# Patient Record
Sex: Female | Born: 1965 | Race: White | Hispanic: No
Health system: Midwestern US, Community
[De-identification: ages and names within clinical notes are randomized; demographics above are authoritative.]

## PROBLEM LIST (undated history)

## (undated) VITALS — BP 107/68 | HR 88 | Temp 97.3°F | Resp 16 | Ht 68.25 in | Wt 198.8 lb

## (undated) DIAGNOSIS — F419 Anxiety disorder, unspecified: Secondary | ICD-10-CM

## (undated) DIAGNOSIS — R7611 Nonspecific reaction to tuberculin skin test without active tuberculosis: Secondary | ICD-10-CM

## (undated) DIAGNOSIS — Z8619 Personal history of other infectious and parasitic diseases: Secondary | ICD-10-CM

## (undated) DIAGNOSIS — K259 Gastric ulcer, unspecified as acute or chronic, without hemorrhage or perforation: Secondary | ICD-10-CM

## (undated) DIAGNOSIS — T7840XA Allergy, unspecified, initial encounter: Secondary | ICD-10-CM

## (undated) DIAGNOSIS — G4730 Sleep apnea, unspecified: Secondary | ICD-10-CM

## (undated) DIAGNOSIS — L509 Urticaria, unspecified: Secondary | ICD-10-CM

## (undated) DIAGNOSIS — F32A Depression, unspecified: Secondary | ICD-10-CM

## (undated) DIAGNOSIS — M1990 Unspecified osteoarthritis, unspecified site: Secondary | ICD-10-CM

## (undated) DIAGNOSIS — G43909 Migraine, unspecified, not intractable, without status migrainosus: Secondary | ICD-10-CM

## (undated) DIAGNOSIS — Z8719 Personal history of other diseases of the digestive system: Secondary | ICD-10-CM

## (undated) DIAGNOSIS — I10 Essential (primary) hypertension: Secondary | ICD-10-CM

## (undated) DIAGNOSIS — F4310 Post-traumatic stress disorder, unspecified: Secondary | ICD-10-CM

## (undated) DIAGNOSIS — K219 Gastro-esophageal reflux disease without esophagitis: Secondary | ICD-10-CM

## (undated) DIAGNOSIS — E7800 Pure hypercholesterolemia, unspecified: Secondary | ICD-10-CM

## (undated) DIAGNOSIS — T783XXA Angioneurotic edema, initial encounter: Secondary | ICD-10-CM

## (undated) DIAGNOSIS — F329 Major depressive disorder, single episode, unspecified: Secondary | ICD-10-CM

## (undated) DIAGNOSIS — Z5189 Encounter for other specified aftercare: Secondary | ICD-10-CM

## (undated) DIAGNOSIS — K861 Other chronic pancreatitis: Secondary | ICD-10-CM

## (undated) DIAGNOSIS — K831 Obstruction of bile duct: Secondary | ICD-10-CM

## (undated) HISTORY — DX: Encounter for other specified aftercare: Z51.89

## (undated) HISTORY — DX: Sleep apnea, unspecified: G47.30

## (undated) HISTORY — DX: Migraine, unspecified, not intractable, without status migrainosus: G43.909

## (undated) HISTORY — DX: Other chronic pancreatitis: K86.1

## (undated) HISTORY — DX: Anxiety disorder, unspecified: F41.9

## (undated) HISTORY — PX: ABDOMINAL HYSTERECTOMY: SHX81

## (undated) HISTORY — DX: Morbid (severe) obesity due to excess calories: E66.01

## (undated) HISTORY — DX: Nonspecific reaction to tuberculin skin test without active tuberculosis: R76.11

## (undated) HISTORY — DX: Angioneurotic edema, initial encounter: T78.3XXA

## (undated) HISTORY — PX: FOOT SURGERY: SHX648

## (undated) HISTORY — DX: Depression, unspecified: F32.A

## (undated) HISTORY — PX: COLONOSCOPY: SHX174

## (undated) HISTORY — DX: Obstruction of bile duct: K83.1

## (undated) HISTORY — DX: Allergy, unspecified, initial encounter: T78.40XA

## (undated) HISTORY — DX: Personal history of other infectious and parasitic diseases: Z86.19

## (undated) HISTORY — DX: Urticaria, unspecified: L50.9

## (undated) HISTORY — PX: EYE SURGERY: SHX253

## (undated) HISTORY — PX: OTHER SURGICAL HISTORY: SHX169

## (undated) HISTORY — PX: GASTRIC BYPASS: SHX52

## (undated) HISTORY — DX: Major depressive disorder, single episode, unspecified: F32.9

## (undated) HISTORY — DX: Post-traumatic stress disorder, unspecified: F43.10

## (undated) HISTORY — DX: Gastric ulcer, unspecified as acute or chronic, without hemorrhage or perforation: K25.9

---

## 2004-10-08 ENCOUNTER — Encounter (INDEPENDENT_AMBULATORY_CARE_PROVIDER_SITE_OTHER): Payer: Self-pay | Admitting: *Deleted

## 2004-10-09 ENCOUNTER — Inpatient Hospital Stay (HOSPITAL_COMMUNITY): Admission: RE | Admit: 2004-10-09 | Discharge: 2004-10-11 | Payer: Self-pay | Admitting: Gynecology

## 2005-08-27 ENCOUNTER — Other Ambulatory Visit: Admission: RE | Admit: 2005-08-27 | Discharge: 2005-08-27 | Payer: Self-pay | Admitting: Gynecology

## 2006-08-31 ENCOUNTER — Other Ambulatory Visit: Admission: RE | Admit: 2006-08-31 | Discharge: 2006-08-31 | Payer: Self-pay | Admitting: Gynecology

## 2009-12-02 ENCOUNTER — Emergency Department (HOSPITAL_BASED_OUTPATIENT_CLINIC_OR_DEPARTMENT_OTHER): Admission: EM | Admit: 2009-12-02 | Discharge: 2009-12-02 | Payer: Self-pay | Admitting: Emergency Medicine

## 2010-01-13 ENCOUNTER — Emergency Department (HOSPITAL_BASED_OUTPATIENT_CLINIC_OR_DEPARTMENT_OTHER): Admission: EM | Admit: 2010-01-13 | Discharge: 2010-01-13 | Payer: Self-pay | Admitting: Emergency Medicine

## 2010-06-17 ENCOUNTER — Other Ambulatory Visit: Payer: Self-pay | Admitting: Gynecology

## 2010-08-30 NOTE — Discharge Summary (Signed)
NAMEZula, Brittany [name]                  ACCOUNT NO.:  [id number]   MEDICAL RECORD NO.:  [id number]          PATIENT TYPE:  INP   LOCATION:  1618                         FACILITY:  [facility]   PHYSICIAN:  Gretta Cool, M.D. DATE OF BIRTH:  Jan 25, 1966   DATE OF ADMISSION:  10/08/2004  DATE OF DISCHARGE:  10/11/2004                                 DISCHARGE SUMMARY   HISTORY OF PRESENT ILLNESS:  [name] is a 45 year old female, gravida 0,  who reported that she had a hysteroscopy ablation procedure by Gynecare's  device done on July 11, 2004.  Approximately two weeks later, was  hospitalized with pelvic infection, treated intensely with antibiotics from  April 13-16.  On follow up, she was noted to have a 6 cm residual  inflammatory mass.  She continued treatment with Cipro and Flagyl under the  primary care of Dr. Quentin Cornwall, her primary care physician.  She was  subsequently seen in our office to evaluate continuing intense abdominal  pain.  Ultrasound revealed a complex cystic mass compatible with a tubo-  ovarian inflammatory mass.  The uterus showed no significant residual  endometrium but some loculation of fluid.  Her sed rate was elevated to 88.  She had an elevated white count, elevated platelet count with a shift to the  left.  She was continued on intensive antibiotic therapy for diagnosis of  pelvic inflammatory mass and persistent inflammatory disease.  Over time,  her sed rate dropped to 64 and most recent in September 17, 2004, was 63.  CA-125  was slightly elevated at 30.5.  CRP was 28.9.  Her process was felt to be  sufficiently resolved, and she is now admitted for definitive therapy by  exploratory laparotomy, cervical hysterectomy and left salpingo-  oophorectomy.  She has a history of right salpingo-oophorectomy, had  laparotomy in 1997 for pelvic inflammatory process.  She has involuntary  infertility and history of PID in 1997.  She is now admitted for definitive   therapy.   PHYSICAL EXAMINATION:  CHEST:  Clear to A&P.  HEART:  Regular rate and rhythm without murmurs, gallops, or cardiac  enlargement.  ABDOMEN:  Soft with massive panniculi.  Previous vertical incision with  treatment of pelvic inflammatory process in 1997 with no masses palpable.  PELVIC:  External genitalia within normal limits for female.  Vagina was  clean and rugous.  Cervix is nulliparous, very high in her vagina.  Uterus  is impossible to palpate.  There is intense left adnexal tenderness and  fullness barely palpable at the tip of he examining finger.  Her exam is  very limited due to her abdominal panniculus and size on ultrasound.  She  has a large complex mass measuring 7.4 x 4.6 and long ovarian complex to the  ovarian complex.  There is a thick echogenic material inside that seems to  float.  Her uterus shows changes of hysteroscopy with hysterogram affect  indicating probable cervical closure and hematometra or myometrial.   IMPRESSION:  1.  Tubo-ovarian complex mass following hysteroscopy resection ablation.  2.  History of previous right salpingo-oophorectomy in 1997 for PID.   PLAN:  Exploratory laparotomy, supracervical hysterectomy, left salpingo-  oophorectomy, right salpingo-oophorectomy previously done.   LABORATORY DATA:  Admission hemoglobin 13.3, hematocrit 38.3.  On the first  postoperative day hemoglobin 11.6, hematocrit 33.3.  On the second day, was  11.1, hematocrit 32.4.  Glucose slightly elevated on admission at 114.  Urine pregnancy test was negative.   HOSPITAL COURSE:  The patient underwent exploratory laparotomy, lysis of  severe small bowel adhesions, anterior abdominal wall lysis of extensive  colon adhesions to the abscessed wall and to the posterior aspect of the  uterus, supracervical left salpingo-oophorectomy and super cervical  hysterectomy under general anesthesia.  Procedures were completed without  any complications, and the  patient was returned to the recovery room in  excellent condition.  Pathology report revealed marked acute and chronic  endometritis, superficial infiltrate of the acute and chronic inflammation,  serosal adhesions, left ovary and fallopian tube, extensive acute chronic  inflammation consistent with tubo-ovarian abscess.  Her postoperative course  was without complications, and she was discharged on postop day #2 in  excellent condition.   DISCHARGE INSTRUCTIONS:  1.  No heavy lifting or straining, no vaginal entrance and increased      ambulation as tolerated.  2.  She is to call with any fever over 104.4 or failure of daily      improvement.  3.  Diet:  Regular.   DISCHARGE MEDICATIONS:  1.  Tylox one p.o. q.4 h. p.r.n. discomfort.  2.  Celebrex 200 mg daily x3 days, then to Aleve or Motrin.  3.  Medications if needed to help with bowel movement.  She is to return to      the office in one week for follow up.   CONDITION ON DISCHARGE:  Excellent.   FINAL DISCHARGE DIAGNOSES:  1.  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  2.  Lysis of extensive adhesions.   PROCEDURES:  Exploratory laparotomy, lysis of severe small bowel adhesions,  anterior abdominal wall, lysis of extensive colon adhesions to the abscessed  wall and to the posterior aspect of the uterus, supracervical left salpingo-  oophorectomy and cervical hysterectomy under general anesthesia.      Matt Holmes, N.P.    ______________________________  Gretta Cool, M.D.    EMK/MEDQ  D:  11/28/2004  T:  11/28/2004  Job:  [id number]

## 2010-08-30 NOTE — Op Note (Signed)
NAME:  Brittany [name], Brittany [name]                  ACCOUNT NO.:  [id number]   MEDICAL RECORD NO.:  [id number]          PATIENT TYPE:  AMB   LOCATION:  DAY                          [facility]ACILITY:  [facility]   PHYSICIAN:  Gretta Cool, M.D. DATE OF BIRTH:  May 14, 1965   DATE OF PROCEDURE:  10/08/2004  DATE OF DISCHARGE:                                 OPERATIVE REPORT   PREOPERATIVE DIAGNOSIS:  Left tuboovarian abscess with incapacitating daily  pelvic pain.   POSTOPERATIVE DIAGNOSIS:  Left tuboovarian abscess with incapacitating daily  pelvic pain.   PROCEDURE:  Exploratory laparotomy, lysis of severe small bowel adhesions,  anterior abdominal wall, lysis of extensive colon adhesions to the abscess  wall and to the posterior aspect of the uterus, supracervical left salpingo-  oophorectomy and supracervical hysterectomy.   SURGEON:  Gretta Cool, M.D.   ASSISTANT:  Gerrit Friends. Aldona Bar, M.D.   ANESTHESIA:  General orotracheal.   DESCRIPTION OF PROCEDURE:  Under satisfactory general anesthesia with the  patient prepped and draped in lithotomy position with Foley catheter  draining her bladder, a vertical skin incision was made and extended through  the fascia, rectus muscles were then separated in the midline, the  peritoneum opened. There was extensive adhesion of the small bowel to the  anterior abdominal wall at the site of her previous right salpingo-  oophorectomy done elsewhere. After careful dissection of the small bowel off  of the abdominoperitoneum, the incision was extended to the pelvis. At this  point, the abdomen was explored and there were no abnormalities identified  in the upper abdomen. Examination of the pelvis revealed an enormous left  tuboovarian abscess with exceedingly dense adhesion to the lateral pelvic  wall to the posterior aspect of the uterus and to the descending colon and  rectosigmoid colon. The adhesions were released by sharp and blunt  dissection. During the  dissection of the ovarian mass, the mass leaked a  large quantity of purulent creamy yellow looking fluid. There was no odor to  suggest persisting anaerobic infection. There was extreme edema and  inflammation of all the tissues. The adhesions were reasonably mature  however, and there was not excessive bleeding. Once the uterus was  sufficiently mobilized to clamp the adnexal pedicles, the pedicles were were  clamped across. Once the ovary was mobilized sufficiently, the round  ligament was clamped and transected. The posterior leaf of the broad  ligament was then opened and the infundibulopelvic vessels isolated from the  ureter, clamped, cut, sutured and tied with #0 Vicryl. A second free tie was  used to doubly ligate the infundibulopelvic on the left. The ovary was then  mobilized sufficiently and the adnexal pedicle was clamped across with a  Kelly clamp. The ovary was then excised for better visibility. On the right,  the ovaries and tube had been previously removed. The round ligament was  transected and then ligated. The anterior leaf of the broad ligament was  opened and the bladder pushed off the lower segment. The uterine vessels  were then clamped, cut, sutured and tied with #0  Vicryl. At this point, the  upper portion of the cardinal ligaments was clamped with straight Masterson  clamps, cut, sutured and tied with #0 Vicryl. At this point, the cervix was  grasped on each angle by Allis clamps and a supracervical hysterectomy  performed. The cervix was excised and the tissue submitted to pathology. The  remaining endocervical canal was then opened and a Jackson-Pratt drain type  drain passed through the cervix into the vagina and then secured in the  depth of the pelvis. All rind and necrotic debris that could be removed was  excised. The pelvis was irrigated copiously to remove all foreign debris. At  this point, the cervix was closed with a running suture of #0 Vicryl. The   colon was then placed down over the suction drain and the packs and  retractors removed. The abdominoperitoneum was then closed with a running  suture of #0 Monocryl. The fascia was then closed with a running mattress  suture of #1 PDS from each angle to the midline. A near far suture technique  was used with great care to approximate the edges of the fascia as perfectly  as possible. At this point, the subcutaneous tissue was approximated with  layers of 3-0 Vicryl and the skin was closed with skin staples with Steri-  Strips. At the end of the procedure, sponge and lap counts were correct,  there were no complications. A suction drain was attached to the anterior  abdominal wall and placed under suction. The patient returned to the  recovery room in excellent condition.   ESTIMATED BLOOD LOSS:  5 to 700 mL, replacement none.   COMPLICATIONS:  None.       CWL/MEDQ  D:  10/08/2004  T:  10/08/2004  Job:  [id number]   cc:   Bess Kinds, MD   [facility]der[facility]ance  [address]  [place]  [place] [address]  Fax: [phone]   Gerrit Friends. Aldona Bar, M.D.  [address]  [place]  [place] [address]  Fax: [phone]

## 2010-08-30 NOTE — H&P (Signed)
NAME:  Brittany [name], Brittany [name]                  ACCOUNT NO.:  [id number]   MEDICAL RECORD NO.:  [id number]          PATIENT TYPE:  AMB   LOCATION:  DAY                          FACILITY:  [facility]   PHYSICIAN:  Gretta Cool, M.D. DATE OF BIRTH:  January 04, 1966   DATE OF ADMISSION:  10/08/2004  DATE OF DISCHARGE:                                HISTORY & PHYSICAL   CHIEF COMPLAINT:  Pelvic pain, constant and severe incapacitating.   HISTORY OF PRESENT ILLNESS:  A 45 year old nulligravida with a history of  hysteroscopy ablation procedure by Gynecare device done July 11, 2004.  Approximately two weeks later, she was hospitalized with pelvic infection  treated intensively with antibiotics from April 13 to July 28, 2004. On  followup, she was noted to have a 6 cm residual inflammatory mass. She  continued treatment with Cipro and Flagyl under the primary care of Dr. Quentin Cornwall, her PCP. She was subsequently seen in my office for evaluation of  continuing intense abdominal pain. On ultrasound exam, she was found to have  a complex cystic mass compatible with tuboovarian inflammatory mass. Her  uterus showed no significant residual endometrium but some loculation of  fluid. Her sedimentation rate was elevated to 88. She had an elevated white  count, elevated platelet count marked shift to the left. She was continued  on intensive antibiotic therapy for a diagnosis of pelvic inflammatory mass  and persisting pelvic inflammatory disease. Her sedimentation rate dropped  over time to 64 and the most recent one on September 17, 2004 was 34. Her CA 125  was very slightly elevated at 30.5, known to be elevated with inflammatory  processes. Her CRP was 28.9. Her process is felt to be sufficiently resolved  now that she is admitted for definitive therapy by exploratory laparotomy,  supracervical hysterectomy and left salpingo-oophorectomy. She has a history  of right salpingo-oophorectomy, had a laparotomy in 1997 for  pelvic  inflammatory process. She has involuntary infertility and has a history of  pelvic inflammatory disease in 1997 at that previous procedure. She is now  admitted for definitive therapy. She understands the risks, benefits, and  alternative therapies. She also understands the extreme risk of bowel,  bladder, adjacent organ injury because of previous procedures, pelvic  inflammatory disease and her extreme obesity. She has had bowel prep  preoperatively. She has been taken off oral contraceptives preoperatively  also to avoid thrombosis risk.   PAST MEDICAL HISTORY:  1.  Usual childhood disease without sequelae.  2.  Medical illnesses:  3.  Pelvic inflammatory disease in 1997.  4.  History of endometrial ablation July 11, 2004.  5.  History of hospitalization for pelvic inflammatory disease post      procedure as above.   She has no other significant hospitalizations or past medical history.   FAMILY HISTORY:  Father has heart disease and diabetes, age 71. Mom is 7 in  good health. Three sisters all in good health. She has remote family history  of cancer maternal grandmother unknown type. Maternal aunt had breast  cancer. Her mom  and aunt both have Graves disease, no other known familial  tendency.   SOCIAL HISTORY:  The patient is married to a man who has children  previously. She still has two stepchildren living at home and one step  grandchild.   REVIEW OF SYMPTOMS:  HEENT:  Denies symptoms. CARDIORESPIRATORY:  Denies  asthma, cough, bronchitis, shortness of breath. GI/GU:  Denies frequency,  urgency, dysuria, change in bowel habits. She has constant intense pain  since the pelvic inflammatory process began, gradually resolved with  treatment of the inflammatory process.   PHYSICAL EXAMINATION:  GENERAL:  Well-developed, well-nourished, white  female but massively over ideal weight at 315 pounds.  VITAL SIGNS:  Her blood pressure is 132/90. She is 5 feet 10 inches.   HEENT:  Pupils equal round and reactive to light and accommodate. Fundi not  examined. Oropharynx clear.  NECK:  Supple without mass or thyroid enlargement.  CHEST:  Clear P to A.  BREASTS:  Without mass, nodes or nipple discharge.  HEART:  Regular rhythm without murmur or cardiac enlargement.  ABDOMEN:  Soft with massive panniculi. Previous vertical incision from the  treatment of pelvic inflammatory process in 1997. No masses are palpable.  PELVIC:  External genitalia normal female, vagina is clean, rugose. Cervix  is nulliparous, very high in the vagina. The uterus is impossible to  palpate. There is intense left adnexal tenderness and fullness barely  palpable at the tip of the examining finger. Her exam is very limited  because of her abdominal panniculus and size. On ultrasound examination, she  has a large complex mass measuring 7.4 x 4.6 thick walled ovarian complex,  tuboovarian complex. There is thick echogenic material inside that seems to  float around compatible with the inflammatory process. Her uterus shows  changes of hysteroscopy with some hysterogram effect indicating probable  cervical closure and hematometria or myometria.  EXTREMITIES:  Negative.  NEUROLOGIC:  Physiologic.   IMPRESSION:  1.  Tuboovarian complex mass following hysteroscopy resection ablation.  2.  History of previous right salpingo-oophorectomy 1997 for pelvic      inflammatory disease.   PLAN:  Exploratory laparotomy, supracervical hysterectomy, left salpingo-  oophorectomy. Note previous right salpingo-oophorectomy.       CWL/MEDQ  D:  10/08/2004  T:  10/08/2004  Job:  [id number]   cc:   Ernst Breach  [place], [place]   [facility] Stamb[facility]  [address]  [place]  [place] [address]  Fax: [phone]

## 2010-10-20 ENCOUNTER — Encounter: Payer: Self-pay | Admitting: Emergency Medicine

## 2010-10-20 ENCOUNTER — Emergency Department (HOSPITAL_BASED_OUTPATIENT_CLINIC_OR_DEPARTMENT_OTHER)
Admission: EM | Admit: 2010-10-20 | Discharge: 2010-10-20 | Disposition: A | Discharge status: Home / Self Care | Payer: 59 | Attending: Emergency Medicine | Admitting: Emergency Medicine

## 2010-10-20 DIAGNOSIS — R51 Headache: Secondary | ICD-10-CM | POA: Insufficient documentation

## 2010-10-20 DIAGNOSIS — R42 Dizziness and giddiness: Secondary | ICD-10-CM | POA: Insufficient documentation

## 2010-10-20 DIAGNOSIS — R209 Unspecified disturbances of skin sensation: Secondary | ICD-10-CM | POA: Insufficient documentation

## 2010-10-20 HISTORY — DX: Essential (primary) hypertension: I10

## 2010-10-20 HISTORY — DX: Pure hypercholesterolemia, unspecified: E78.00

## 2010-10-20 MED ORDER — SODIUM CHLORIDE 0.9 % IV SOLN
Freq: Once | INTRAVENOUS | Status: AC
  Administered 2010-10-20: 1000 mL via INTRAVENOUS
  Filled 2010-10-20: qty 1000

## 2010-10-20 MED ORDER — DIPHENHYDRAMINE HCL 50 MG/ML IJ SOLN
INTRAMUSCULAR | Status: AC
  Filled 2010-10-20: qty 1

## 2010-10-20 MED ORDER — DIPHENHYDRAMINE HCL 50 MG/ML IJ SOLN
25.0000 mg | Freq: Once | INTRAMUSCULAR | Status: AC
  Administered 2010-10-20: 25 mg via INTRAVENOUS

## 2010-10-20 MED ORDER — DIPHENHYDRAMINE HCL 12.5 MG/5ML PO ELIX
25.0000 mg | ORAL_SOLUTION | Freq: Once | ORAL | Status: DC
  Filled 2010-10-20: qty 10

## 2010-10-20 MED ORDER — METOCLOPRAMIDE HCL 5 MG/ML IJ SOLN
10.0000 mg | Freq: Once | INTRAMUSCULAR | Status: AC
  Administered 2010-10-20: 10 mg via INTRAVENOUS
  Filled 2010-10-20: qty 2

## 2010-10-20 MED ORDER — KETOROLAC TROMETHAMINE 30 MG/ML IJ SOLN
30.0000 mg | Freq: Once | INTRAMUSCULAR | Status: AC
  Administered 2010-10-20: 30 mg via INTRAVENOUS
  Filled 2010-10-20: qty 1

## 2010-10-20 NOTE — ED Notes (Signed)
Family at bedside. 

## 2010-10-20 NOTE — ED Notes (Signed)
Pt. Is resting with warm blankets after IV and meds.

## 2010-10-20 NOTE — ED Notes (Signed)
Pt. Is alert and oriented with reports of feeling better and no nausea per Pt.

## 2010-10-20 NOTE — ED Provider Notes (Signed)
History     Chief Complaint  Patient presents with  . Headache   Patient is a 45 y.o. female presenting with headaches. The history is provided by the patient and the spouse.  Headache  The current episode started yesterday. The quality of the pain is described as dull and throbbing. The pain is moderate. She has tried injectable narcotic analgesics for the symptoms. The treatment provided no relief.    Past Medical History  Diagnosis Date  . Migraine   . Hypertension   . Hypercholesterolemia     Past Surgical History  Procedure Date  . Abdominal hysterectomy   . Foot surgery     No family history on file.  History  Substance Use Topics  . Smoking status: Never Smoker   . Smokeless tobacco: Not on file  . Alcohol Use: Yes     occ    OB History    Grav Para Term Preterm Abortions TAB SAB Ect Mult Living                  Review of Systems  Constitutional: Negative.   Eyes: Negative.   Respiratory: Negative.   Cardiovascular: Negative.   Gastrointestinal: Negative.   Genitourinary: Negative.   Musculoskeletal: Negative.   Neurological: Positive for light-headedness, numbness and headaches.  Psychiatric/Behavioral: Negative.     Physical Exam  BP 113/80  Pulse 61  Temp(Src) 98.1 F (36.7 C) (Oral)  Resp 16  Physical Exam  Constitutional: She is oriented to person, place, and time. She appears well-developed and well-nourished.  HENT:  Head: Normocephalic.  Eyes: Conjunctivae and EOM are normal. Pupils are equal, round, and reactive to light.  Neck: Normal range of motion. Neck supple.  Cardiovascular: Normal rate.   Pulmonary/Chest: Effort normal and breath sounds normal.  Abdominal: Soft.  Musculoskeletal: Normal range of motion.  Neurological: She is alert and oriented to person, place, and time.  Skin: Skin is warm.  Psychiatric: She has a normal mood and affect.    ED Course  Procedures  MDM Pt reports she feels much better after IV NS x  1 liter, torodol, reglan and benadryl      Langston Masker, [place] 10/20/10 1937

## 2010-12-28 ENCOUNTER — Emergency Department (INDEPENDENT_AMBULATORY_CARE_PROVIDER_SITE_OTHER): Payer: 59

## 2010-12-28 ENCOUNTER — Emergency Department (HOSPITAL_BASED_OUTPATIENT_CLINIC_OR_DEPARTMENT_OTHER)
Admission: EM | Admit: 2010-12-28 | Discharge: 2010-12-28 | Disposition: A | Discharge status: Home / Self Care | Payer: 59 | Attending: Emergency Medicine | Admitting: Emergency Medicine

## 2010-12-28 DIAGNOSIS — I10 Essential (primary) hypertension: Secondary | ICD-10-CM | POA: Insufficient documentation

## 2010-12-28 DIAGNOSIS — E7800 Pure hypercholesterolemia, unspecified: Secondary | ICD-10-CM | POA: Insufficient documentation

## 2010-12-28 DIAGNOSIS — S61209A Unspecified open wound of unspecified finger without damage to nail, initial encounter: Secondary | ICD-10-CM | POA: Insufficient documentation

## 2010-12-28 DIAGNOSIS — S61219A Laceration without foreign body of unspecified finger without damage to nail, initial encounter: Secondary | ICD-10-CM

## 2010-12-28 DIAGNOSIS — X58XXXA Exposure to other specified factors, initial encounter: Secondary | ICD-10-CM

## 2010-12-28 DIAGNOSIS — M25549 Pain in joints of unspecified hand: Secondary | ICD-10-CM

## 2010-12-28 DIAGNOSIS — W208XXA Other cause of strike by thrown, projected or falling object, initial encounter: Secondary | ICD-10-CM | POA: Insufficient documentation

## 2010-12-28 MED ORDER — TETANUS-DIPHTH-ACELL PERTUSSIS 5-2.5-18.5 LF-MCG/0.5 IM SUSP
0.5000 mL | Freq: Once | INTRAMUSCULAR | Status: AC
  Administered 2010-12-28: 0.5 mL via INTRAMUSCULAR
  Filled 2010-12-28: qty 0.5

## 2010-12-28 MED ORDER — HYDROCODONE-ACETAMINOPHEN 5-325 MG PO TABS
2.0000 | ORAL_TABLET | Freq: Once | ORAL | Status: AC
  Administered 2010-12-28: 2 via ORAL

## 2010-12-28 MED ORDER — ONDANSETRON 8 MG PO TBDP
8.0000 mg | ORAL_TABLET | Freq: Once | ORAL | Status: AC
  Administered 2010-12-28: 8 mg via ORAL

## 2010-12-28 MED ORDER — HYDROCODONE-ACETAMINOPHEN 5-325 MG PO TABS
ORAL_TABLET | ORAL | Status: AC
  Administered 2010-12-28: 2 via ORAL
  Filled 2010-12-28: qty 2

## 2010-12-28 MED ORDER — ONDANSETRON 8 MG PO TBDP
ORAL_TABLET | ORAL | Status: AC
  Administered 2010-12-28: 8 mg via ORAL
  Filled 2010-12-28: qty 1

## 2010-12-28 MED ORDER — OXYCODONE-ACETAMINOPHEN 5-325 MG PO TABS: 1.0000 | ORAL_TABLET | ORAL | Status: AC | PRN

## 2010-12-28 NOTE — ED Provider Notes (Signed)
History     CSN: [id number] Arrival date & time: 12/28/2010 12:34 PM   Chief Complaint  Patient presents with  . Hand Injury  . Laceration   Patient had laceration of right third finger today while helping with the latter. The ladder fell and landed on that finger lacerating it. She has not have any numbness or tingling. She received her tetanus immunization prior to my valuation.  (Include location/radiation/quality/duration/timing/severity/associated sxs/prior treatment) HPI   Past Medical History  Diagnosis Date  . Migraine   . Hypertension   . Hypercholesterolemia      Past Surgical History  Procedure Date  . Abdominal hysterectomy   . Foot surgery     No family history on file.  History  Substance Use Topics  . Smoking status: Never Smoker   . Smokeless tobacco: Not on file  . Alcohol Use: Yes     occ    OB History    Grav Para Term Preterm Abortions TAB SAB Ect Mult Living                  Review of Systems  All other systems reviewed and are negative.    Allergies  Amoxicillin  Home Medications   Current Outpatient Rx  Name Route Sig Dispense Refill  . ESTRADIOL 0.1 MG/24HR TD PTTW Transdermal Place 0.5 patches onto the skin 2 (two) times a week.      Marland Kitchen HYDROCHLOROTHIAZIDE 25 MG PO TABS Oral Take 25 mg by mouth daily.      Marland Kitchen ROSUVASTATIN CALCIUM 20 MG PO TABS Oral Take 20 mg by mouth daily.        Physical Exam    BP 121/74  Pulse 88  SpO2 98%  Physical Exam  Pulmonary/Chest: Tenderness: DIP joint and extending distally. Patient has full active range of motion of the finger. 2. discrimination is intact. Bleeding is controlled.   Well-developed well-nourished female sitting that is not appear to be in any acute distress vital signs are initially normal repeat blood pressure is 86/68 his taken again and is normal HEENT normocephalic atraumatic Eyes pupils equal round react to light Neck range of motion is normal is supple Cardiovascular  normal rate and rhythm Chest wall and pulmonary there is no signs of trauma to the chest wall lungs clear auscultation Abdomen soft nontender Extremities are significant for a 4 cm laceration on the palmar surface of the third finger beginning in a V. shape at the ED Course  Wound repair Date/Time: 12/28/2010 2:06 PM Performed by: Hilario Quarry Authorized by: Hilario Quarry Consent: Verbal consent obtained. Risks and benefits: risks, benefits and alternatives were discussed Consent given by: patient Patient understanding: patient states understanding of the procedure being performed Imaging studies: imaging studies available Patient identity confirmed: verbally with patient Time out: Immediately prior to procedure a "time out" was called to verify the correct patient, procedure, equipment, support staff and site/side marked as required. Preparation: Patient was prepped and draped in the usual sterile fashion. Local anesthesia used: yes Anesthesia: digital block Local anesthetic: lidocaine 1% without epinephrine Anesthetic total: 2 ml Patient sedated: no Patient tolerance: Patient tolerated the procedure well with no immediate complications. Comments: 4 cm laceration on the palmar surface of the third digit of the right hand. The patient's wound was visually explored no evidence of tendon involvement is noted. No foreign bodies are noted. Wound is repaired with #5-0 Prolene sutures placed in simple interrupted fashion. The patient will have a dressing  and splint placed. She is instructed to followup for suture removal in 8-10 days.    No results found for this or any previous visit. Dg Hand Complete Right  12/28/2010  *RADIOLOGY REPORT*  Clinical Data: Question injury of the hand.  Pain in the third through fifth fingers.  Anterior laceration of the third finger. Unable to fan fingers.  RIGHT HAND - COMPLETE 3+ VIEW  Comparison: None  Findings:  There is no evidence for acute fracture  or dislocation.   The lateral view is limited by nonstandard positioning secondary to pain. Possible radiopaque foreign body identified along the dorsal aspect of the proximal interphalangeal joint of the third or fourth digit on the lateral view.  IMPRESSION:  1.  No evidence for fracture. 2.  Possible radiopaque foreign body as described.  Original Report Authenticated By: Patterson Hammersmith, M.D.     No diagnosis found.   MDM        Hilario Quarry, MD 12/28/10 [phone]

## 2010-12-28 NOTE — ED Notes (Signed)
Pt injured right hand/fingers.  Laceration to middle finger.  Bleeding controlled.  Pt had wedged her hand in ladder and crushed 3 middle fingers.

## 2010-12-29 ENCOUNTER — Encounter (HOSPITAL_BASED_OUTPATIENT_CLINIC_OR_DEPARTMENT_OTHER): Payer: Self-pay | Admitting: Emergency Medicine

## 2011-06-25 ENCOUNTER — Other Ambulatory Visit: Payer: Self-pay | Admitting: Gynecology

## 2011-07-01 ENCOUNTER — Emergency Department (HOSPITAL_BASED_OUTPATIENT_CLINIC_OR_DEPARTMENT_OTHER)
Admission: EM | Admit: 2011-07-01 | Discharge: 2011-07-01 | Disposition: A | Discharge status: Home / Self Care | Payer: 59 | Attending: Emergency Medicine | Admitting: Emergency Medicine

## 2011-07-01 ENCOUNTER — Encounter (HOSPITAL_BASED_OUTPATIENT_CLINIC_OR_DEPARTMENT_OTHER): Payer: Self-pay | Admitting: *Deleted

## 2011-07-01 DIAGNOSIS — R110 Nausea: Secondary | ICD-10-CM | POA: Insufficient documentation

## 2011-07-01 DIAGNOSIS — G43909 Migraine, unspecified, not intractable, without status migrainosus: Secondary | ICD-10-CM | POA: Insufficient documentation

## 2011-07-01 DIAGNOSIS — E7800 Pure hypercholesterolemia, unspecified: Secondary | ICD-10-CM | POA: Insufficient documentation

## 2011-07-01 DIAGNOSIS — I10 Essential (primary) hypertension: Secondary | ICD-10-CM | POA: Insufficient documentation

## 2011-07-01 MED ORDER — DEXAMETHASONE SODIUM PHOSPHATE 10 MG/ML IJ SOLN
10.0000 mg | Freq: Once | INTRAMUSCULAR | Status: AC
  Administered 2011-07-01: 10 mg via INTRAVENOUS
  Filled 2011-07-01: qty 1

## 2011-07-01 MED ORDER — METOCLOPRAMIDE HCL 5 MG/ML IJ SOLN
10.0000 mg | Freq: Once | INTRAMUSCULAR | Status: AC
  Administered 2011-07-01: 10 mg via INTRAVENOUS
  Filled 2011-07-01: qty 2

## 2011-07-01 MED ORDER — DIPHENHYDRAMINE HCL 50 MG/ML IJ SOLN
25.0000 mg | Freq: Once | INTRAMUSCULAR | Status: AC
  Administered 2011-07-01: 25 mg via INTRAVENOUS
  Filled 2011-07-01: qty 1

## 2011-07-01 MED ORDER — SODIUM CHLORIDE 0.9 % IV BOLUS (SEPSIS)
1000.0000 mL | Freq: Once | INTRAVENOUS | Status: AC
  Administered 2011-07-01: 1000 mL via INTRAVENOUS

## 2011-07-01 NOTE — ED Provider Notes (Signed)
History     CSN: [id number]  Arrival date & time 07/01/11  0806   First MD Initiated Contact with Patient 07/01/11 [phone]      Chief Complaint  Patient presents with  . Migraine    (Consider location/radiation/quality/duration/timing/severity/associated sxs/prior treatment) HPI Comments: Patient presents with a migraine headache. She states the pain started 3 days ago in the back of her head and is now radiating throughout her head. She has a history of migraines, which have been worsening over the last year and a half, and states that this pain is the same type of pain that she typically has with her migraine headaches. She denies anything unusual about this headache. Denies any fevers or chills. She has had some nausea, but no vomiting. She has some mild photophobia and more intense sensitivity to sounds. She tried Imitrex at home without relief. She's been followed for her headaches by her primary care physician, although they discussed sending her to see a neurologist for better management. She denies any neuro deficits. She states that the headaches has been gradually getting worse since Sunday  The history is provided by the patient.    Past Medical History  Diagnosis Date  . Migraine   . Hypertension   . Hypercholesterolemia     Past Surgical History  Procedure Date  . Abdominal hysterectomy   . Foot surgery     History reviewed. No pertinent family history.  History  Substance Use Topics  . Smoking status: Never Smoker   . Smokeless tobacco: Not on file  . Alcohol Use: Yes     occ    OB History    Grav Para Term Preterm Abortions TAB SAB Ect Mult Living                  Review of Systems  Constitutional: Negative for fever, chills, diaphoresis and fatigue.  HENT: Negative for congestion, rhinorrhea and sneezing.   Eyes: Negative.   Respiratory: Negative for cough, chest tightness and shortness of breath.   Cardiovascular: Negative for chest pain and leg  swelling.  Gastrointestinal: Positive for nausea. Negative for vomiting, abdominal pain, diarrhea and blood in stool.  Genitourinary: Negative for frequency, hematuria, flank pain and difficulty urinating.  Musculoskeletal: Negative for back pain and arthralgias.  Skin: Negative for rash.  Neurological: Positive for headaches. Negative for dizziness, speech difficulty, weakness and numbness.    Allergies  Amoxicillin  Home Medications   Current Outpatient Rx  Name Route Sig Dispense Refill  . VITAMIN D (ERGOCALCIFEROL) 50000 UNITS PO CAPS Oral Take 50,000 Units by mouth.    . ESTRADIOL 0.1 MG/24HR TD PTTW Transdermal Place 0.5 patches onto the skin 2 (two) times a week.      Marland Kitchen HYDROCHLOROTHIAZIDE 25 MG PO TABS Oral Take 25 mg by mouth daily.      Marland Kitchen ROSUVASTATIN CALCIUM 20 MG PO TABS Oral Take 20 mg by mouth daily.        BP 139/75  Pulse 58  Temp(Src) 97.5 F (36.4 C) (Oral)  Resp 20  Ht 5\' 9"  (1.753 m)  SpO2 98%  Physical Exam  Constitutional: She is oriented to person, place, and time. She appears well-developed and well-nourished.  HENT:  Head: Normocephalic and atraumatic.  Eyes: Conjunctivae are normal. Pupils are equal, round, and reactive to light.       Normal fundi  Neck: Normal range of motion. Neck supple.       No meningeal signs  Cardiovascular: Normal  rate, regular rhythm and normal heart sounds.   Pulmonary/Chest: Effort normal and breath sounds normal. No respiratory distress. She has no wheezes. She has no rales. She exhibits no tenderness.  Abdominal: Soft. Bowel sounds are normal. There is no tenderness. There is no rebound and no guarding.  Musculoskeletal: Normal range of motion. She exhibits no edema.  Lymphadenopathy:    She has no cervical adenopathy.  Neurological: She is alert and oriented to person, place, and time. She has normal strength. No cranial nerve deficit or sensory deficit. Coordination normal.  Skin: Skin is warm and dry. No rash  noted.  Psychiatric: She has a normal mood and affect.    ED Course  Procedures (including critical care time)  Labs Reviewed - No data to display No results found.   1. Migraine       MDM  Patient presents with migraine is typical for her normal migraines. She denies any unusual symptoms that would suggest subarachnoid hemorrhage, intracranial, mass, or other intracranial abnormality, such as meningitis. Do not feel that CT and LP are indicated at this point. Patient was given migraine, cocktail here and says that she feels much better. We'll discharge her home to follow up with her primary care physician advised to return here for any worsening headache or other unusual symptoms        Rolan Bucco, MD 07/01/11 [phone]

## 2011-07-01 NOTE — ED Notes (Signed)
MD at bedside. 

## 2011-07-01 NOTE — Discharge Instructions (Signed)

## 2011-07-01 NOTE — ED Notes (Signed)
Headache started Sunday mainly at base of head took one imitrex at onset but didn't take any more. Denies vomiting since Sunday pm.

## 2011-07-01 NOTE — ED Notes (Signed)
Patient is resting comfortably.  Family at bedside.  No assistance needed.

## 2012-04-21 ENCOUNTER — Ambulatory Visit (INDEPENDENT_AMBULATORY_CARE_PROVIDER_SITE_OTHER): Payer: 59 | Admitting: Surgery

## 2012-04-21 ENCOUNTER — Encounter (INDEPENDENT_AMBULATORY_CARE_PROVIDER_SITE_OTHER): Payer: Self-pay | Admitting: Surgery

## 2012-04-21 NOTE — Progress Notes (Addendum)
Re:   Brittany [name] DOB:   30-May-1965 MRN:   [id number]  ASSESSMENT AND PLAN: 1.  Morbid obesity - weight - 312, BMI - 45.2  Per the 1991 NIH Consensus Statement, the patient is a candidate for bariatric surgery.  The patient attended our initial information session and reviewed the types of bariatric surgery.    The patient is interested in the Roux en Y Gastric Bypass.  I discussed with the patient the indications and risks of bariatric surgery.  The potential risks of surgery include, but are not limited to, bleeding, infection, leak from the bowel, DVT and PE, open surgery, long term nutrition consequences, and death.  The patient understands the importance of compliance and long term follow-up with our group after surgery.  From here we will obtain lab tests, x-rays, nutrition consult, and psych consult.  [UGI - small sliding HH.  DN 05/03/12]  2.  History of migraines  About 2 per year. 3.  Hypertension 4.  Hypercholesterolemia. 5.  Internal hemorrhoids. 6.  Family history of colon cancer - her mother had colon cancer.  Chief Complaint  Patient presents with  . Obesity    bariatric initial- bypass   REFERRING PHYSICIAN: Karle Plumber, MD  HISTORY OF PRESENT ILLNESS: Brittany [name] is a 47 y.o. (DOB: 1965/05/22)  white female whose primary care physician is Karle Plumber, MD [facility], [facility]) and comes to me today for bariatric surgery.  Patient attended the information session the Dr. Ezzard Standing spoke at. She has tried multiple diets including, Weight Watchers, Medi spa twice (included Meridia and a Phen/phen type of med), a diet clinic in [place], and [employer] at [employer].  She has had a the most success with Weight Watchers and the Y programs.   She said that her husband is very supportive and I told her that I wanted him to come to the pre op visit.   She has made checklist at home to decide the right operation for her.  She talked a little about  her concerns about the surgery.  She talked about fear of failure of the operation in controlling her weight.  She has a cousin (who is younger) who had a sleeve and lives in the [place].  Past Medical History  Diagnosis Date  . Migraine   . Hypertension   . Hypercholesterolemia   . Depression     Past Surgical History  Procedure Date  . Abdominal hysterectomy   . Foot surgery     Current Outpatient Prescriptions  Medication Sig Dispense Refill  . estradiol (VIVELLE-DOT) 0.1 MG/24HR Place 0.5 patches onto the skin 2 (two) times a week.        . hydrochlorothiazide 25 MG tablet Take 25 mg by mouth daily.        . hydrocortisone (ANUSOL-HC) 2.5 % rectal cream Place rectally 2 (two) times daily.      . rosuvastatin (CRESTOR) 20 MG tablet Take 20 mg by mouth daily.        Marland Kitchen terbinafine (LAMISIL) 250 MG tablet Take 250 mg by mouth daily.      . traMADol (ULTRAM) 50 MG tablet Take 50 mg by mouth every 6 (six) hours as needed.      . Vilazodone HCl (VIIBRYD) 20 MG TABS Take by mouth.      . Vitamin D, Ergocalciferol, (DRISDOL) 50000 UNITS CAPS Take 50,000 Units by mouth.        Allergies  Allergen Reactions  .  Amoxicillin Hives   REVIEW OF SYSTEMS: Skin:  No history of rash.  No history of abnormal moles. Infection:  No history of hepatitis or HIV.  No history of MRSA. Neurologic:  History of migraine headaches abotu 2 per year.  Controlled with Imitrex. This all started about 4 years ago, but she is not sure why. Cardiac:  Hypertension x 2 years.  No history of seeing a cardiologist. Pulmonary:  Does not smoke cigarettes.  No asthma or bronchitis.  No OSA/CPAP.  Endocrine:  Hypercholesterolemia x 2 years.  No diabetes. No thyroid disease. Gastrointestinal:  No history of stomach disease.  No history of liver disease.  No history of gall bladder disease.  No history of pancreas disease.  No history of colon disease. Internal hemorrhoids.  History of colonoscopy in 2013.  Her mother  had colon cancer. Urologic:  No history of kidney stones.  No history of bladder infections. GYN:  No pregnancies.  Had hysterectomy 2006 for benign disease. Dr. Nicholas Lose is her Gyn.  Also has had a right oophorectomy. Musculoskeletal:  She's had some back and knee discomfort, but she is not seeing an orthopedist on a regular basis. Hematologic:  No bleeding disorder.  No history of anemia.  Not anticoagulated. Psycho-social:  The patient is oriented.   The patient has no obvious psychologic or social impairment to understanding our conversation and plan.  SOCIAL and FAMILY HISTORY: Married. No biologic children, but two step daughters who are like her own. 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  HEENT: Normal. Pupils equal. Neck: Supple. No mass.  No thyroid mass. Lymph Nodes:  No supraclavicular or cervical nodes. Lungs: Clear to auscultation and symmetric breath sounds. Heart:  RRR. No murmur or rub. Abdomen: Soft. No mass. No tenderness. No hernia. Normal bowel sounds.  Lower midline abdominal scar.  A little more pear than apple.  She does have a small panus.  Rectal: Not done but on external exam, she no external hemorrhoids.  She is going back to her GI doctor to treat some internal hemorrhoids. Extremities:  Good strength and ROM  in upper and lower extremities. Neurologic:  Grossly intact to motor and sensory function. Psychiatric: Has normal mood and affect. Behavior is normal.   DATA REVIEWED: Notes from [facility]  Ovidio Kin, MD,  [facility], [place] [address] [place]  [address]   [place], [place]    [address] Phone:  [phone] FAX:  [phone]

## 2012-04-27 ENCOUNTER — Encounter: Payer: Self-pay | Admitting: *Deleted

## 2012-04-27 ENCOUNTER — Encounter: Payer: 59 | Attending: Surgery | Admitting: *Deleted

## 2012-04-27 DIAGNOSIS — Z01818 Encounter for other preprocedural examination: Secondary | ICD-10-CM | POA: Insufficient documentation

## 2012-04-27 DIAGNOSIS — Z713 Dietary counseling and surveillance: Secondary | ICD-10-CM | POA: Insufficient documentation

## 2012-04-27 NOTE — Patient Instructions (Addendum)
   Follow Pre-Op Nutrition Goals to prepare for Gastric Bypass Surgery.   Call the [facility] at [phone] once you have been given your surgery date to enrolled in the Pre-Op Nutrition Class. You will need to attend this nutrition class 3-4 weeks prior to your surgery. 

## 2012-04-27 NOTE — Progress Notes (Signed)
  Pre-Op Assessment Visit:  Pre-Operative RYGB Surgery  Medical Nutrition Therapy:  Appt start time: 1100   End time:  1200.  Patient was seen on 04/27/2012 for Pre-Operative RYGB Nutrition Assessment. Assessment and letter of approval faxed to [facility] Bariatric Surgery Program coordinator on 04/27/2012.  Approval letter sent to [facility] center and will be available in the chart under the media tab.  Handouts given during visit include:  Pre-Op Goals   Bariatric Surgery Protein Shakes  Samples given during visit include:   Premier Protein shake: 1 ea Lot # [id number]; Exp: 02/19/13   Unjury Protein Powder Lot # [id number]; Exp: 03/15 (1 ea) Lot # [id number]; Exp: 02/15 (1 ea)  Patient to call for Pre-Op and Post-Op Nutrition Education at the [facility] when surgery is scheduled.

## 2012-04-28 ENCOUNTER — Other Ambulatory Visit: Payer: Self-pay

## 2012-04-28 ENCOUNTER — Ambulatory Visit (HOSPITAL_COMMUNITY)
Admission: RE | Admit: 2012-04-28 | Discharge: 2012-04-28 | Disposition: A | Discharge status: Home / Self Care | Payer: 59 | Source: Ambulatory Visit | Attending: Surgery | Admitting: Surgery

## 2012-04-28 DIAGNOSIS — K449 Diaphragmatic hernia without obstruction or gangrene: Secondary | ICD-10-CM | POA: Insufficient documentation

## 2012-04-28 DIAGNOSIS — E7800 Pure hypercholesterolemia, unspecified: Secondary | ICD-10-CM | POA: Insufficient documentation

## 2012-04-28 DIAGNOSIS — D7389 Other diseases of spleen: Secondary | ICD-10-CM | POA: Insufficient documentation

## 2012-04-28 DIAGNOSIS — I10 Essential (primary) hypertension: Secondary | ICD-10-CM | POA: Insufficient documentation

## 2012-04-28 DIAGNOSIS — K219 Gastro-esophageal reflux disease without esophagitis: Secondary | ICD-10-CM | POA: Insufficient documentation

## 2012-04-28 DIAGNOSIS — Z6841 Body Mass Index (BMI) 40.0 and over, adult: Secondary | ICD-10-CM | POA: Insufficient documentation

## 2012-05-03 ENCOUNTER — Ambulatory Visit (HOSPITAL_COMMUNITY)
Admission: RE | Admit: 2012-05-03 | Discharge: 2012-05-03 | Disposition: A | Discharge status: Home / Self Care | Payer: 59 | Source: Ambulatory Visit | Attending: Surgery | Admitting: Surgery

## 2012-05-03 ENCOUNTER — Encounter (HOSPITAL_COMMUNITY)
Admission: RE | Disposition: A | Discharge status: Home / Self Care | Payer: Self-pay | Source: Ambulatory Visit | Attending: Surgery

## 2012-05-03 HISTORY — PX: BREATH TEK H PYLORI: SHX5422

## 2012-05-03 SURGERY — BREATH TEST, FOR HELICOBACTER PYLORI

## 2012-05-04 ENCOUNTER — Encounter (HOSPITAL_COMMUNITY): Payer: Self-pay | Admitting: Surgery

## 2012-05-19 ENCOUNTER — Telehealth (INDEPENDENT_AMBULATORY_CARE_PROVIDER_SITE_OTHER): Payer: Self-pay | Admitting: Surgery

## 2012-05-19 NOTE — Telephone Encounter (Signed)
05/19/12 I spoke with Noreene Larsson @ [facility] and she advised that the patient did have a negative breath-tek test on 05/03/12. (No report received by [facility] and it is not scanned in under Procedures in EPIC.)  cef

## 2012-05-29 ENCOUNTER — Other Ambulatory Visit: Payer: Self-pay

## 2012-06-09 ENCOUNTER — Other Ambulatory Visit (INDEPENDENT_AMBULATORY_CARE_PROVIDER_SITE_OTHER): Payer: Self-pay | Admitting: Surgery

## 2012-06-17 ENCOUNTER — Encounter: Payer: 59 | Attending: Surgery | Admitting: *Deleted

## 2012-06-17 ENCOUNTER — Encounter (INDEPENDENT_AMBULATORY_CARE_PROVIDER_SITE_OTHER): Payer: Self-pay | Admitting: Surgery

## 2012-06-17 DIAGNOSIS — Z01818 Encounter for other preprocedural examination: Secondary | ICD-10-CM | POA: Insufficient documentation

## 2012-06-17 DIAGNOSIS — Z713 Dietary counseling and surveillance: Secondary | ICD-10-CM | POA: Insufficient documentation

## 2012-06-17 NOTE — Progress Notes (Signed)
Bariatric Class:  Appt start time: 1730 end time:  1830.  Pre-Operative Nutrition Class  Patient was seen on 06/17/12 for Pre-Operative Bariatric Surgery Education at [facility].   Surgery date: 07/05/12 Surgery type: RYGB Start weight at [facility]: 312.5 lbs (04/17/12)  Weight today: 313.2 lbs Weight change: n/a Total weight lost: n/a BMI: 44.9  Samples given per MNT protocol:  Bariatric Advantage Multivitamin  Lot # [id number]  Exp:06/15   Bariatric Advantage Sublingual B12  Lot # [id number]  Exp:10/15   Celebrate Vitamins Multivitamin  Lot # [id number]  Exp:06/15   Celebrate Vitamins Calcium Citrate  Lot # [id number]  Exp:08/15   Premier Protein Shake  Lot # [id number]  Exp: 02/19/13   Unjury Protein Powder  Lot # [id number]  Exp: 03/15  The following the learning objective met by the patient during this course:  Identifies Pre-Op Dietary Goals and will begin 2 weeks pre-operatively  Identifies appropriate sources of fluids and proteins   States protein recommendations and appropriate sources pre and post-operatively  Identifies Post-Operative Dietary Goals and will follow for 2 weeks post-operatively  Identifies appropriate multivitamin and calcium sources  Describes the need for physical activity post-operatively and will follow MD recommendations  States when to call healthcare provider regarding medication questions or post-operative complications  Handouts given during class include:  Pre-Op Bariatric Surgery Diet Handout  Protein Shake Handout  Post-Op Bariatric Surgery Nutrition Handout  BELT Program Information Flyer  Support Group Information Flyer  [facility] Bariatric Supplements Price List  Follow-Up Plan: Patient will follow-up at [facility] 2 weeks post operatively for diet advancement per MD.

## 2012-06-18 ENCOUNTER — Encounter: Payer: Self-pay | Admitting: *Deleted

## 2012-06-18 ENCOUNTER — Encounter (HOSPITAL_COMMUNITY): Payer: Self-pay | Admitting: Pharmacy Technician

## 2012-06-18 NOTE — Patient Instructions (Signed)
Follow:   Pre-Op Diet per MD 2 weeks prior to surgery  Phase 2- Liquids (clear/full) 2 weeks after surgery  Vitamin/Mineral/Calcium guidelines for purchasing bariatric supplements  Exercise guidelines pre and post-op per MD  Follow-up at [facility] in 2 weeks post-op for diet advancement. Contact Sara Himmelrich as needed with questions/concerns. 

## 2012-06-21 ENCOUNTER — Telehealth (INDEPENDENT_AMBULATORY_CARE_PROVIDER_SITE_OTHER): Payer: Self-pay

## 2012-06-21 NOTE — Telephone Encounter (Signed)
Advised patient to stop using the estrogen patch. Patient states she stopped using the patch on Thursday 06/17/12.

## 2012-06-23 ENCOUNTER — Ambulatory Visit (INDEPENDENT_AMBULATORY_CARE_PROVIDER_SITE_OTHER): Payer: 59 | Admitting: Surgery

## 2012-06-23 ENCOUNTER — Encounter (INDEPENDENT_AMBULATORY_CARE_PROVIDER_SITE_OTHER): Payer: Self-pay | Admitting: Surgery

## 2012-06-23 NOTE — Progress Notes (Signed)
Re:   Brittany [name] DOB:   05-Jul-1965 MRN:   [id number]  ASSESSMENT AND PLAN: 1.  Morbid obesity - weight - 312, BMI - 45.2  Per the 1991 NIH Consensus Statement, the patient is a candidate for bariatric surgery.  The patient attended our initial information session and reviewed the types of bariatric surgery.    The patient is interested in the Roux en Y Gastric Bypass.  I discussed with the patient the indications and risks of bariatric surgery.  The potential risks of surgery include, but are not limited to, bleeding, infection, leak from the bowel, DVT and PE, open surgery, long term nutrition consequences, and death.  The patient understands the importance of compliance and long term follow-up with our group after surgery.  She has completed her pre op evaluatioin.  She is scheduled for surgery 07/05/2012.  2.  History of migraines  About 2 per year. 3.  Hypertension 4.  Hypercholesterolemia. 5.  Internal hemorrhoids. 6.  Family history of colon cancer - her mother had colon cancer.  No chief complaint on file.  REFERRING PHYSICIAN: Karle Plumber, MD  HISTORY OF PRESENT ILLNESS: Brittany [name] is a 47 y.o. (DOB: 1965-12-14)  white female whose primary care physician is Brittany Plumber, MD [facility], [facility]) and comes to me for pre op visit for bariatric surgery.  She is for RYGB 07/05/2012.  She talked about reading "Weight loss surgery for Dummies" and this has helped her.  She has been through about 3 bowel preps the last year, for colon polyps and hemorrhoids.  Dr. Elbert Ewings. [facility], in [facility], is her treating GI doctor.  So when I talked about her pre op bowel prep, she understood.  We again reviewed with her the surgery, the potential complications, and the post op recovery.  We talked about her prior GYN adding to the operation.  I don't think the small HH will change the surgery, but I did talk about looking at that and possible repairing It.  UGI -  04/28/2012 - small sliding HH.   [place] - 04/28/2012 - small splenic cyst. Psych - 05/05/2012 - Saw Dr. Cyndia Skeeters Breath test - 05/19/2012 - negative  Bariatric History: Patient attended the information session the Dr. Ezzard Standing spoke at. She has tried multiple diets including, Weight Watchers, Medi spa twice (included Meridia and a Phen/phen type of med), a diet clinic in [place], and [employer] at [employer].  She has had a the most success with Weight Watchers and the Y programs.   She said that her husband is very supportive and I told her that I wanted him to come to the pre op visit.   She has made checklist at home to decide the right operation for her.  She talked a little about her concerns about the surgery.  She talked about fear of failure of the operation in controlling her weight.  She has a cousin (who is younger) who had a sleeve and lives in the [place].  Past Medical History  Diagnosis Date  . Migraine   . Hypertension   . Hypercholesterolemia   . Depression   . Sleep apnea   . Morbid obesity     Past Surgical History  Procedure Laterality Date  . Foot surgery    . Abdominal hysterectomy  1997 & 2006  . Breath tek h pylori  05/03/2012    Procedure: BREATH TEK H PYLORI;  Surgeon: Kandis Cocking, MD;  Location: [facility];  Service: General;  Laterality: N/A;    Current Outpatient Prescriptions  Medication Sig Dispense Refill  . calcium citrate-vitamin D (CITRACAL+D) 315-200 MG-UNIT per tablet Take 2 tablets by mouth daily.      Marland Kitchen estradiol (VIVELLE-DOT) 0.1 MG/24HR Place 0.5 patches onto the skin 2 (two) times a week.        . hydrocortisone (PROCTOZONE-HC) 2.5 % rectal cream Place 1 application rectally daily as needed for hemorrhoids.       . Multiple Vitamin (MULTIVITAMIN WITH MINERALS) TABS Take 1 tablet by mouth daily.      . rosuvastatin (CRESTOR) 20 MG tablet Take 20 mg by mouth daily before breakfast.       . traMADol (ULTRAM) 50 MG tablet Take 50 mg by  mouth every 6 (six) hours as needed for pain.       Marland Kitchen triamterene-hydrochlorothiazide (DYAZIDE) 37.5-25 MG per capsule Take 1 capsule by mouth daily before breakfast.       . Vilazodone HCl (VIIBRYD) 20 MG TABS Take 1 tablet by mouth every evening.       . Vitamin D, Ergocalciferol, (DRISDOL) 50000 UNITS CAPS Take 50,000 Units by mouth every 7 (seven) days.        No current facility-administered medications for this visit.    Allergies  Allergen Reactions  . Amoxicillin Hives   REVIEW OF SYSTEMS: Skin:  No history of rash.  No history of abnormal moles. Infection:  No history of hepatitis or HIV.  No history of MRSA. Neurologic:  History of migraine headaches abotu 2 per year.  Controlled with Imitrex. This all started about 4 years ago, but she is not sure why. Cardiac:  Hypertension x 2 years.  No history of seeing a cardiologist. Pulmonary:  Does not smoke cigarettes.  No asthma or bronchitis.  No OSA/CPAP.  Endocrine:  Hypercholesterolemia x 2 years.  No diabetes. No thyroid disease. Gastrointestinal:  No history of stomach disease.  No history of liver disease.  No history of gall bladder disease.  No history of pancreas disease.  No history of colon disease. Internal hemorrhoids.  History of colonoscopy in 2013.  Her mother had colon cancer. Urologic:  No history of kidney stones.  No history of bladder infections. GYN:  No pregnancies.  She had a right tube/ovary removed by Dr. Ashley Royalty in 1997.  Had hysterectomy 2006 for benign disease. Dr. Nicholas Lose is her Gyn.  Also has had a right oophorectomy. Musculoskeletal:  She's had some back and knee discomfort, but she is not seeing an orthopedist on a regular basis. Hematologic:  No bleeding disorder.  No history of anemia.  Not anticoagulated. Psycho-social:  The patient is oriented.   The patient has no obvious psychologic or social impairment to understanding our conversation and plan.  SOCIAL and FAMILY HISTORY: Married. No biologic  children, but two step daughters who are like her own. 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  HEENT: Normal. Pupils equal. Neck: Supple. No mass.  No thyroid mass. Lymph Nodes:  No supraclavicular or cervical nodes. Lungs: Clear to auscultation and symmetric breath sounds. Heart:  RRR. No murmur or rub. Abdomen: Soft. No mass. No tenderness. No hernia.  Normal bowel sounds.  Lower midline abdominal scar from prior GYN surgery.  A little more pear than apple.  She does have a small panus.  Extremities:  Good strength and ROM  in upper and lower extremities. Neurologic:  Grossly intact to motor and sensory function. Psychiatric: Has normal mood and affect. Behavior is normal.   DATA REVIEWED: Notes from [facility]  Ovidio Kin, MD,  [facility], [place] [address] [place]  [address]   [place], [place]    [address] Phone:  [phone] FAX:  [phone]

## 2012-06-29 NOTE — Patient Instructions (Addendum)
Brittany [name]  06/29/2012   Your procedure is scheduled on: 07/05/12    Report to [facility] at   1015  AM.  Call this number if you have problems the morning of surgery: [phone]   Remember:   Follow bowel prep instructions per MD    Do not eat food or drink liquids after midnight.   Take these medicines the morning of surgery with A SIP OF WATER:    Do not wear jewelry, make-up or nail polish.  Do not wear lotions, powders, or perfumes.   Do not shave 48 hours prior to surgery. .  Do not bring valuables to the hospital.  Contacts, dentures or bridgework may not be worn into surgery.  Leave suitcase in the car. After surgery it may be brought to your room.  For patients admitted to the hospital, checkout time is 11:00 AM the day of  discharge.       SEE CHG INSTRUCTION SHEET    Please read over the following fact sheets that you were given: MRSA Information, coughing and deep breathing exercises, leg exercises, Incentive Spirometry Fact sheet                Failure to comply with these instructions may result in cancellation of your surgery.                Patient Signature ____________________________              Nurse Signature _____________________________

## 2012-06-30 ENCOUNTER — Ambulatory Visit (HOSPITAL_COMMUNITY)
Admission: RE | Admit: 2012-06-30 | Discharge: 2012-06-30 | Disposition: A | Discharge status: Home / Self Care | Payer: 59 | Source: Ambulatory Visit | Attending: Surgery | Admitting: Surgery

## 2012-06-30 ENCOUNTER — Encounter (HOSPITAL_COMMUNITY)
Admission: RE | Admit: 2012-06-30 | Discharge: 2012-06-30 | Disposition: A | Discharge status: Home / Self Care | Payer: 59 | Source: Ambulatory Visit | Attending: Surgery | Admitting: Surgery

## 2012-06-30 ENCOUNTER — Encounter (HOSPITAL_COMMUNITY): Payer: Self-pay

## 2012-06-30 DIAGNOSIS — Z01812 Encounter for preprocedural laboratory examination: Secondary | ICD-10-CM | POA: Insufficient documentation

## 2012-06-30 HISTORY — DX: Gastro-esophageal reflux disease without esophagitis: K21.9

## 2012-06-30 HISTORY — DX: Unspecified osteoarthritis, unspecified site: M19.90

## 2012-06-30 HISTORY — DX: Personal history of other diseases of the digestive system: Z87.19

## 2012-06-30 LAB — COMPREHENSIVE METABOLIC PANEL
ALT: 27 U/L (ref 0–35)
AST: 13 U/L (ref 0–37)
Albumin: 4.2 g/dL (ref 3.5–5.2)
Alkaline Phosphatase: 63 U/L (ref 39–117)
BUN: 19 mg/dL (ref 6–23)
CO2: 27 mEq/L (ref 19–32)
Calcium: 9.9 mg/dL (ref 8.4–10.5)
Chloride: 99 mEq/L (ref 96–112)
Creatinine, Ser: 0.63 mg/dL (ref 0.50–1.10)
GFR calc Af Amer: >90 mL/min (ref >90)
GFR calc non Af Amer: >90 mL/min (ref >90)
Glucose, Bld: 80 mg/dL (ref 70–99)
Potassium: 3.6 mEq/L (ref 3.5–5.1)
Sodium: 138 mEq/L (ref 135–145)
Total Bilirubin: 0.5 mg/dL (ref 0.3–1.2)
Total Protein: 7.6 g/dL (ref 6.0–8.3)

## 2012-06-30 LAB — CBC WITH DIFFERENTIAL/PLATELET
Basophils Absolute: 0 10*3/uL (ref 0.0–0.1)
Basophils Relative: 0 % (ref 0–1)
Eosinophils Absolute: 0.1 10*3/uL (ref 0.0–0.7)
Eosinophils Relative: 2 % (ref 0–5)
HCT: 41.8 % (ref 36.0–46.0)
Hemoglobin: 14.5 g/dL (ref 12.0–15.0)
Lymphocytes Relative: 28 % (ref 12–46)
Lymphs Abs: 1.4 10*3/uL (ref 0.7–4.0)
MCH: 32.1 pg (ref 26.0–34.0)
MCHC: 34.7 g/dL (ref 30.0–36.0)
MCV: 92.5 fL (ref 78.0–100.0)
Monocytes Absolute: 0.4 10*3/uL (ref 0.1–1.0)
Monocytes Relative: 7 % (ref 3–12)
Neutro Abs: 3 10*3/uL (ref 1.7–7.7)
Neutrophils Relative %: 63 % (ref 43–77)
Platelets: 300 10*3/uL (ref 150–400)
RBC: 4.52 MIL/uL (ref 3.87–5.11)
RDW: 12.4 % (ref 11.5–15.5)
WBC: 4.9 10*3/uL (ref 4.0–10.5)

## 2012-06-30 LAB — SURGICAL PCR SCREEN
MRSA, PCR: NEGATIVE
Staphylococcus aureus: POSITIVE — AB

## 2012-06-30 NOTE — Progress Notes (Signed)
Patient called back and received message for positive PCR screen for staph.  Patient voiced understanding.

## 2012-07-05 ENCOUNTER — Encounter (HOSPITAL_COMMUNITY)
Admission: RE | Disposition: A | Discharge status: Home / Self Care | Payer: Self-pay | Source: Ambulatory Visit | Attending: Surgery

## 2012-07-05 ENCOUNTER — Encounter (HOSPITAL_COMMUNITY): Payer: Self-pay | Admitting: Anesthesiology

## 2012-07-05 ENCOUNTER — Inpatient Hospital Stay (HOSPITAL_COMMUNITY)
Admission: RE | Admit: 2012-07-05 | Discharge: 2012-07-07 | DRG: 621 | Disposition: A | Discharge status: Home / Self Care | Payer: 59 | Source: Ambulatory Visit | Attending: Surgery | Admitting: Surgery

## 2012-07-05 ENCOUNTER — Encounter (HOSPITAL_COMMUNITY): Payer: Self-pay | Admitting: *Deleted

## 2012-07-05 ENCOUNTER — Inpatient Hospital Stay (HOSPITAL_COMMUNITY): Payer: 59 | Admitting: Anesthesiology

## 2012-07-05 DIAGNOSIS — K432 Incisional hernia without obstruction or gangrene: Secondary | ICD-10-CM | POA: Diagnosis present

## 2012-07-05 DIAGNOSIS — Z6841 Body Mass Index (BMI) 40.0 and over, adult: Secondary | ICD-10-CM

## 2012-07-05 DIAGNOSIS — I10 Essential (primary) hypertension: Secondary | ICD-10-CM | POA: Diagnosis present

## 2012-07-05 DIAGNOSIS — Z79899 Other long term (current) drug therapy: Secondary | ICD-10-CM

## 2012-07-05 DIAGNOSIS — K648 Other hemorrhoids: Secondary | ICD-10-CM | POA: Diagnosis present

## 2012-07-05 DIAGNOSIS — K660 Peritoneal adhesions (postprocedural) (postinfection): Secondary | ICD-10-CM | POA: Diagnosis present

## 2012-07-05 DIAGNOSIS — E7800 Pure hypercholesterolemia, unspecified: Secondary | ICD-10-CM | POA: Diagnosis present

## 2012-07-05 DIAGNOSIS — D1803 Hemangioma of intra-abdominal structures: Secondary | ICD-10-CM | POA: Diagnosis present

## 2012-07-05 HISTORY — PX: LAPAROSCOPIC LYSIS OF ADHESIONS: SHX5905

## 2012-07-05 HISTORY — PX: GASTRIC ROUX-EN-Y: SHX5262

## 2012-07-05 HISTORY — PX: UPPER GI ENDOSCOPY: SHX6162

## 2012-07-05 SURGERY — LAPAROSCOPIC ROUX-EN-Y GASTRIC
Anesthesia: General | Site: Abdomen | Wound class: Clean Contaminated

## 2012-07-05 MED ORDER — LACTATED RINGERS IV SOLN
INTRAVENOUS | Status: DC | PRN
  Administered 2012-07-05 (×3): via INTRAVENOUS

## 2012-07-05 MED ORDER — NEOSTIGMINE METHYLSULFATE 1 MG/ML IJ SOLN
INTRAMUSCULAR | Status: DC | PRN
  Administered 2012-07-05: 5 mg via INTRAVENOUS

## 2012-07-05 MED ORDER — BUPIVACAINE HCL (PF) 0.25 % IJ SOLN
INTRAMUSCULAR | Status: AC
  Filled 2012-07-05: qty 30

## 2012-07-05 MED ORDER — MORPHINE SULFATE 2 MG/ML IJ SOLN
2.0000 mg | INTRAMUSCULAR | Status: DC | PRN
  Administered 2012-07-05 – 2012-07-06 (×3): 2 mg via INTRAVENOUS
  Filled 2012-07-05 (×3): qty 1

## 2012-07-05 MED ORDER — MEPERIDINE HCL 50 MG/ML IJ SOLN: 6.2500 mg | INTRAMUSCULAR | Status: DC | PRN

## 2012-07-05 MED ORDER — FENTANYL CITRATE 0.05 MG/ML IJ SOLN
INTRAMUSCULAR | Status: DC | PRN
  Administered 2012-07-05 (×8): 50 ug via INTRAVENOUS
  Administered 2012-07-05: 100 ug via INTRAVENOUS

## 2012-07-05 MED ORDER — TISSEEL VH 10 ML EX KIT
PACK | CUTANEOUS | Status: AC
  Filled 2012-07-05: qty 2

## 2012-07-05 MED ORDER — UNJURY CHOCOLATE CLASSIC POWDER: 2.0000 [oz_av] | Freq: Four times a day (QID) | ORAL | Status: DC

## 2012-07-05 MED ORDER — LACTATED RINGERS IR SOLN
Status: DC | PRN
  Administered 2012-07-05: 3000 mL

## 2012-07-05 MED ORDER — LACTATED RINGERS IV SOLN: INTRAVENOUS | Status: DC

## 2012-07-05 MED ORDER — TISSEEL VH 10 ML EX KIT
PACK | CUTANEOUS | Status: DC | PRN
  Administered 2012-07-05 (×2): 10 mL

## 2012-07-05 MED ORDER — DEXAMETHASONE SODIUM PHOSPHATE 10 MG/ML IJ SOLN
INTRAMUSCULAR | Status: DC | PRN
  Administered 2012-07-05: 10 mg via INTRAVENOUS

## 2012-07-05 MED ORDER — ROCURONIUM BROMIDE 100 MG/10ML IV SOLN
INTRAVENOUS | Status: DC | PRN
  Administered 2012-07-05: 10 mg via INTRAVENOUS
  Administered 2012-07-05: 20 mg via INTRAVENOUS
  Administered 2012-07-05 (×2): 10 mg via INTRAVENOUS
  Administered 2012-07-05: 30 mg via INTRAVENOUS
  Administered 2012-07-05: 10 mg via INTRAVENOUS

## 2012-07-05 MED ORDER — UNJURY VANILLA POWDER
2.0000 [oz_av] | Freq: Four times a day (QID) | ORAL | Status: DC
  Administered 2012-07-07: 2 [oz_av] via ORAL

## 2012-07-05 MED ORDER — FENTANYL CITRATE 0.05 MG/ML IJ SOLN
25.0000 ug | INTRAMUSCULAR | Status: DC | PRN
  Administered 2012-07-05: 50 ug via INTRAVENOUS
  Administered 2012-07-05: 25 ug via INTRAVENOUS

## 2012-07-05 MED ORDER — ONDANSETRON HCL 4 MG/2ML IJ SOLN
4.0000 mg | INTRAMUSCULAR | Status: DC | PRN
  Administered 2012-07-06: 4 mg via INTRAVENOUS
  Filled 2012-07-05: qty 2

## 2012-07-05 MED ORDER — PROMETHAZINE HCL 25 MG/ML IJ SOLN: 6.2500 mg | INTRAMUSCULAR | Status: DC | PRN

## 2012-07-05 MED ORDER — HEPARIN SODIUM (PORCINE) 5000 UNIT/ML IJ SOLN
5000.0000 [IU] | INTRAMUSCULAR | Status: AC
  Administered 2012-07-05: 5000 [IU] via SUBCUTANEOUS
  Filled 2012-07-05: qty 1

## 2012-07-05 MED ORDER — BUPIVACAINE HCL (PF) 0.25 % IJ SOLN
INTRAMUSCULAR | Status: DC | PRN
  Administered 2012-07-05: 30 mL

## 2012-07-05 MED ORDER — HEPARIN SODIUM (PORCINE) 5000 UNIT/ML IJ SOLN
5000.0000 [IU] | Freq: Three times a day (TID) | INTRAMUSCULAR | Status: DC
  Administered 2012-07-05 – 2012-07-07 (×5): 5000 [IU] via SUBCUTANEOUS
  Filled 2012-07-05 (×8): qty 1

## 2012-07-05 MED ORDER — FENTANYL CITRATE 0.05 MG/ML IJ SOLN
INTRAMUSCULAR | Status: AC
  Filled 2012-07-05: qty 2

## 2012-07-05 MED ORDER — ACETAMINOPHEN 10 MG/ML IV SOLN
INTRAVENOUS | Status: DC | PRN
  Administered 2012-07-05: 1000 mg via INTRAVENOUS

## 2012-07-05 MED ORDER — UNJURY CHICKEN SOUP POWDER: 2.0000 [oz_av] | Freq: Four times a day (QID) | ORAL | Status: DC

## 2012-07-05 MED ORDER — ACETAMINOPHEN 160 MG/5ML PO SOLN: 650.0000 mg | ORAL | Status: DC | PRN

## 2012-07-05 MED ORDER — DEXTROSE 5 % IV SOLN
2.0000 g | INTRAVENOUS | Status: AC
  Administered 2012-07-05: 2 g via INTRAVENOUS

## 2012-07-05 MED ORDER — KCL IN DEXTROSE-NACL 20-5-0.45 MEQ/L-%-% IV SOLN
INTRAVENOUS | Status: DC
  Administered 2012-07-05 – 2012-07-07 (×5): via INTRAVENOUS
  Filled 2012-07-05 (×10): qty 1000

## 2012-07-05 MED ORDER — MIDAZOLAM HCL 5 MG/5ML IJ SOLN
INTRAMUSCULAR | Status: DC | PRN
  Administered 2012-07-05: 2 mg via INTRAVENOUS

## 2012-07-05 MED ORDER — LIDOCAINE-EPINEPHRINE 1 %-1:100000 IJ SOLN
INTRAMUSCULAR | Status: AC
  Filled 2012-07-05: qty 1

## 2012-07-05 MED ORDER — ONDANSETRON HCL 4 MG/2ML IJ SOLN
INTRAMUSCULAR | Status: DC | PRN
  Administered 2012-07-05: 4 mg via INTRAVENOUS

## 2012-07-05 MED ORDER — OXYCODONE-ACETAMINOPHEN 5-325 MG/5ML PO SOLN: 5.0000 mL | ORAL | Status: DC | PRN

## 2012-07-05 MED ORDER — SUCCINYLCHOLINE CHLORIDE 20 MG/ML IJ SOLN
INTRAMUSCULAR | Status: DC | PRN
  Administered 2012-07-05: 100 mg via INTRAVENOUS

## 2012-07-05 MED ORDER — GLYCOPYRROLATE 0.2 MG/ML IJ SOLN
INTRAMUSCULAR | Status: DC | PRN
  Administered 2012-07-05: .8 mg via INTRAVENOUS

## 2012-07-05 MED ORDER — PROPOFOL 10 MG/ML IV BOLUS
INTRAVENOUS | Status: DC | PRN
  Administered 2012-07-05: 150 mg via INTRAVENOUS
  Administered 2012-07-05: 20 mg via INTRAVENOUS

## 2012-07-05 MED ORDER — ACETAMINOPHEN 10 MG/ML IV SOLN
1000.0000 mg | Freq: Four times a day (QID) | INTRAVENOUS | Status: AC
  Administered 2012-07-05 – 2012-07-06 (×4): 1000 mg via INTRAVENOUS
  Filled 2012-07-05 (×7): qty 100

## 2012-07-05 SURGICAL SUPPLY — 74 items
APPLICATOR COTTON TIP 6IN STRL (MISCELLANEOUS) ×4 IMPLANT
APPLIER CLIP ROT 13.4 12 LRG (CLIP)
BLADE SURG 15 STRL LF DISP TIS (BLADE) ×1 IMPLANT
BLADE SURG 15 STRL SS (BLADE) ×1
CABLE HIGH FREQUENCY MONO STRZ (ELECTRODE) ×2 IMPLANT
CANISTER SUCTION 2500CC (MISCELLANEOUS) ×4 IMPLANT
CLIP APPLIE ROT 13.4 12 LRG (CLIP) IMPLANT
CLIP SUT LAPRA TY ABSORB (SUTURE) ×4 IMPLANT
CLOTH BEACON ORANGE TIMEOUT ST (SAFETY) ×2 IMPLANT
CUTTER LINEAR ENDO ART 45 ETS (STAPLE) ×2 IMPLANT
DECANTER SPIKE VIAL GLASS SM (MISCELLANEOUS) ×2 IMPLANT
DERMABOND ADVANCED (GAUZE/BANDAGES/DRESSINGS) ×2
DERMABOND ADVANCED .7 DNX12 (GAUZE/BANDAGES/DRESSINGS) ×2 IMPLANT
DEVICE SUTURE ENDOST 10MM (ENDOMECHANICALS) ×2 IMPLANT
DEVICE TROCAR PUNCTURE CLOSURE (ENDOMECHANICALS) ×2 IMPLANT
DISSECTOR BLUNT TIP ENDO 5MM (MISCELLANEOUS) IMPLANT
DRAIN PENROSE 18X1/4 LTX STRL (WOUND CARE) ×2 IMPLANT
DRAPE CAMERA CLOSED 9X96 (DRAPES) ×2 IMPLANT
DRAPE UTILITY XL STRL (DRAPES) ×4 IMPLANT
DUPLOJECT EASY PREP 4ML (MISCELLANEOUS) ×2 IMPLANT
GAUZE SPONGE 4X4 16PLY XRAY LF (GAUZE/BANDAGES/DRESSINGS) ×2 IMPLANT
GLOVE BIOGEL PI IND STRL 7.0 (GLOVE) ×1 IMPLANT
GLOVE BIOGEL PI INDICATOR 7.0 (GLOVE) ×1
GLOVE SURG SIGNA 7.5 PF LTX (GLOVE) ×2 IMPLANT
GOWN STRL NON-REIN LRG LVL3 (GOWN DISPOSABLE) ×2 IMPLANT
GOWN STRL REIN XL XLG (GOWN DISPOSABLE) ×4 IMPLANT
HOVERMATT SINGLE USE (MISCELLANEOUS) ×2 IMPLANT
KIT BASIN OR (CUSTOM PROCEDURE TRAY) ×2 IMPLANT
KIT GASTRIC LAVAGE 34FR ADT (SET/KITS/TRAYS/PACK) ×2 IMPLANT
MARKER SKIN DUAL TIP RULER LAB (MISCELLANEOUS) ×2 IMPLANT
NEEDLE SPNL 22GX3.5 QUINCKE BK (NEEDLE) ×2 IMPLANT
NS IRRIG 1000ML POUR BTL (IV SOLUTION) ×2 IMPLANT
PACK CARDIOVASCULAR III (CUSTOM PROCEDURE TRAY) ×2 IMPLANT
POUCH SPECIMEN RETRIEVAL 10MM (ENDOMECHANICALS) IMPLANT
RELOAD 45 VASCULAR/THIN (ENDOMECHANICALS) ×4 IMPLANT
RELOAD BLUE (STAPLE) ×4 IMPLANT
RELOAD ENDO STITCH 2.0 (ENDOMECHANICALS) ×21
RELOAD GOLD (STAPLE) IMPLANT
RELOAD STAPLE TA45 3.5 REG BLU (ENDOMECHANICALS) ×6 IMPLANT
RELOAD WHITE ECR60W (STAPLE) ×4 IMPLANT
SCALPEL HARMONIC ACE (MISCELLANEOUS) ×2 IMPLANT
SCISSORS LAP 5X35 DISP (ENDOMECHANICALS) ×2 IMPLANT
SEALANT SURGICAL APPL DUAL CAN (MISCELLANEOUS) ×2 IMPLANT
SET IRRIG TUBING LAPAROSCOPIC (IRRIGATION / IRRIGATOR) ×2 IMPLANT
SLEEVE Z-THREAD 12X100MM (TROCAR) ×4 IMPLANT
SLEEVE Z-THREAD 5X100MM (TROCAR) ×2 IMPLANT
SOLUTION ANTI FOG 6CC (MISCELLANEOUS) ×2 IMPLANT
SPONGE GAUZE 4X4 12PLY (GAUZE/BANDAGES/DRESSINGS) ×2 IMPLANT
STAPLER STANDARD HANDLE (STAPLE) ×2 IMPLANT
STAPLER VISISTAT 35W (STAPLE) ×2 IMPLANT
SUT MON AB 5-0 PS2 18 (SUTURE) ×8 IMPLANT
SUT NOVA 1 T20/GS 25DT (SUTURE) ×2 IMPLANT
SUT RELOAD ENDO STITCH 2 48X1 (ENDOMECHANICALS) ×13
SUT RELOAD ENDO STITCH 2.0 (ENDOMECHANICALS) ×8
SUT VIC AB 2-0 SH 27 (SUTURE) ×1
SUT VIC AB 2-0 SH 27X BRD (SUTURE) ×1 IMPLANT
SUT VIC AB 4-0 PS2 27 (SUTURE) ×2 IMPLANT
SUTURE RELOAD END STTCH 2 48X1 (ENDOMECHANICALS) ×13 IMPLANT
SUTURE RELOAD ENDO STITCH 2.0 (ENDOMECHANICALS) ×8 IMPLANT
SYR 20CC LL (SYRINGE) ×2 IMPLANT
SYR 50ML LL SCALE MARK (SYRINGE) ×2 IMPLANT
SYR CONTROL 10ML LL (SYRINGE) ×2 IMPLANT
TOWEL OR 17X26 10 PK STRL BLUE (TOWEL DISPOSABLE) ×4 IMPLANT
TRAY FOLEY CATH 14FRSI W/METER (CATHETERS) ×2 IMPLANT
TROCAR BLADELESS OPT 5 100 (ENDOMECHANICALS) ×2 IMPLANT
TROCAR ENDOPATH XCEL 12X100 BL (ENDOMECHANICALS) ×2 IMPLANT
TROCAR XCEL 12X100 BLDLESS (ENDOMECHANICALS) ×2 IMPLANT
TROCAR Z-THREAD FIOS 11X100 BL (TROCAR) ×2 IMPLANT
TROCAR Z-THREAD FIOS 12X100MM (TROCAR) ×2 IMPLANT
TROCAR Z-THREAD FIOS 5X100MM (TROCAR) ×2 IMPLANT
TUBING ENDO SMARTCAP (MISCELLANEOUS) ×2 IMPLANT
TUBING FILTER THERMOFLATOR (ELECTROSURGICAL) ×2 IMPLANT
WARMER LAPAROSCOPE (MISCELLANEOUS) IMPLANT
WATER STERILE IRR 1500ML POUR (IV SOLUTION) ×2 IMPLANT

## 2012-07-05 NOTE — Anesthesia Preprocedure Evaluation (Addendum)
Anesthesia Evaluation  Patient identified by MRN, date of birth, ID band Patient awake    Reviewed: Allergy & Precautions, H&P , NPO status , Patient's Chart, lab work & pertinent test results  Airway Mallampati: II TM Distance: >3 FB Neck ROM: Full    Dental no notable dental hx.    Pulmonary neg pulmonary ROS, sleep apnea ,  breath sounds clear to auscultation  Pulmonary exam normal       Cardiovascular hypertension, Pt. on medications Rhythm:Regular Rate:Normal     Neuro/Psych negative neurological ROS  negative psych ROS   GI/Hepatic Neg liver ROS, hiatal hernia, GERD-  ,  Endo/Other  negative endocrine ROS  Renal/GU negative Renal ROS  negative genitourinary   Musculoskeletal negative musculoskeletal ROS (+)   Abdominal   Peds negative pediatric ROS (+)  Hematology negative hematology ROS (+)   Anesthesia Other Findings   Reproductive/Obstetrics negative OB ROS                           Anesthesia Physical Anesthesia Plan  ASA: II  Anesthesia Plan: General   Post-op Pain Management:    Induction: Intravenous  Airway Management Planned: Oral ETT  Additional Equipment:   Intra-op Plan:   Post-operative Plan: Extubation in OR  Informed Consent: I have reviewed the patients History and Physical, chart, labs and discussed the procedure including the risks, benefits and alternatives for the proposed anesthesia with the patient or authorized representative who has indicated his/her understanding and acceptance.   Dental advisory given  Plan Discussed with: CRNA  Anesthesia Plan Comments:         Anesthesia Quick Evaluation

## 2012-07-05 NOTE — Op Note (Signed)
PATIENT:   Brittany [name] DOB:   [birth year] MRN:   [id number]  DATE OF PROCEDURE: 07/05/2012                   FACILITY:  [facility]  OPERATIVE REPORT  PREOPERATIVE DIAGNOSIS:  Morbid obesity.  POSTOPERATIVE DIAGNOSIS:  Morbid obesity (weight 312, BMI of 45.2).  Adhesions to lower anterior abdominal wall underneath midline incision.  3 cm ventral incisional hernia 5 cm above the umbilicus.  4 cm hemangioma on underneath surface of left lobe of liver.  PROCEDURE:  Laparoscopic Roux-en-Y gastric bypass (intraoperative upper endoscopy by Dr. Daphine Deutscher).  Enterolysis of adhesions - 15 minutes.  Repair of ventral incisional hernia (3 cm) with suture.  SURGEON:  Sandria Bales. Ezzard Standing, MD  FIRST ASSISTANT:  Dr. Sheron Nightingale  ANESTHESIA:  General endotracheal.  Anesthesiologist: Phillips Grout, MD CRNA: Delphia Grates, CRNA; Peggy Williford  General  ESTIMATED BLOOD LOSS:  Minimal.  LOCAL ANESTHESIA:  30 cc of 1/4% Marcaine  COMPLICATIONS:  None.  INDICATION FOR SURGERY:  Brittany [name] is a 47 y.o. white  female who sees Karle Plumber, MD as her primary care doctor.  She has completed our preoperative bariatric program and now comes for a laparoscopic Roux-en-Y gastric bypass.  The indications, potential complications of surgery were explained to the patient.  Potential complications of the surgery include, but are not limited to, bleeding, infection, DVT, open surgery, and long-term nutritional consequences.  OPERATIVE NOTE:  The patient taken to room #1 where Ms. Aliene Altes went underwent a general endotracheal anesthetic, supervised by Anesthesiologist: Phillips Grout, MD CRNA: Delphia Grates, CRNA; Peggy Williford.  The patient was given 2 g of cefoxitin at the beginning of the procedure.  A time-out was held and surgical checklist run.  The abdomen was prepped with ChloraPrep and sterilely draped.  I accessed the abdominal cavity through the left upper quadrant using a 12 mm Optiview trocar.  I placed  7 additional trocars: 5 mm subxiphoid, 12 mm right subcostal, 12 mm right paramedian, 12 mm left paramedian, 5 mm lateral subcostal, and a 11 mm below to the right of the umbilicus.  An extra 5 mm port was placed in the LLQ to do enterolysis of adhesions.  The abdomen was insufflated and abdominal exploration carried out.  Right lobe of liver unremarkable.  Left lobe of liver has a 4 cm hemangioma on the undersurface of the liver (opposite the stomach). The stomach that I could see was unremarkable.  The patient had a moderate amount of greater omentum.  It was adhered to the undersurface of the peritoneal cavity at the site of the prior midline incision.  I spent about 15 minutes mobilizing these adhesions.  There was also some small bowel stuck up towards the caudad portion of the adhesions.   At the cranial part of the incision, there was an approximate 3 cm ventral incisional hernia.  I do not think that this was umbilical, but probably from prior midline incision, and was about 5 cm cranial to the umbilicus.  I closed this hernia with two #1 Novafils using the Endoclose device.  I was able to push the omentum and transverse colon up and identified the ligament of Treitz to start the operation.  I measured 40 cm of the jejunum, starting at the ligament of Tritz, and divided the jejunum with a white load of 45 mm Ethicon Endo-GIA stapler.  I divided a short length into the mesentery.  I measured 100 cm  of jejunum for the future gastric limb.  I put a Penrose drain on the future gastric limb of the jejunum.  I then did a side-to-side jejunojejunostomy.  I used a 45 mm white load.  I closed the enterotomy with 2 running 2-0 Vicryl sutures.  I tested the JJ anastomosis with an alligator forceps and then covered this with Tisseel.  I closed the mesenteric defect with a running 2-0 silk suture with a Laparo-tye on each end.  I then divided the omentum with a Harmonic Scalpel.  She had a moderate amount of  omentum.  I positioned the patient in reverse Trendelenburg and placed the liver retractor, which was introduced into the peritoneal cavity through a subxiphoid 5 mm trocar puncture, under the left lobe of the liver.  I then identified the gastroesophageal junction.  She may have a small hiatal hernia. I went to the left at the angle of His and made a window at the esophago-gastric junction for a target as my dissection.  I then went on the lesser curve of the stomach, measured 5 cm from the gastroesophageal junction down the lesser curve and dissected into the lesser sac from the lesser curvature side of the stomach.  I did the first firing of a 45 mm blue load Ethicon Endo-GIA stapler and then did 3 firings of the 60 mm blue load Ethicon Eschelon stapler.  I had to use one final firing of the 45 mm blue load of the Ethicon Endo-GIA to divide the stomach.  This created a gastric pouch approximately 5 cm in length and 3 cm in width.  There was no bleeding from either the pouch or the stomach remnant site.  I placed Tisseel on the pouch side along the new greater curvature.  I over sewed the gastric remnant with a locking 2-0 Vicryl suture with a Laparo-tye on each end..  I then brought the jejunum ante-colic, ante-gastric up to the new stomach pouch and placed a posterior running 2-0 Vicryl suture.  I then made an enterotomy into the stomach using the Ewald as a back stop and an enterotomy into the jejunum.  I did a stapled side-to-side gastrojejunal anastomosis using these two enterotomies with a 45 mm blue load of the Ethicon Endo GIA stapler.  I tried to create a 2.5 cm gastrojejunal anastomosis.  I closed the enterotomy with a 2 running 2-0 Vicryl sutures.  I passed the Ewald tube through the gastrojejunal anastomosis and then did an anterior Connell suture running of 2-0 Vicryl suture for the anterior layer of the gastrojejunostomy.  The Ewald tube was then removed without difficulty.  I then  closed the [place] defect with a figure-of-eight 2-0 silk suture between the mesentery of the transverse colon and the mesentery of the distal jejunum.  Dr. Daphine Deutscher then scrubbed out and did an intraoperative upper endoscopy.  He identified the esophagogastric junction about 39 cm, the gastrojejunal anastomosis about 45 cm.  I clamped off the small bowel.  He insufflated air and I flooded the abdomen with saline. There was no bubbling or evidence of air leak.  He then withdrew the scope and he will dictate that portion of the operation.    I then re-inspected the anastomoses, sucked out the saline, placed Tisseel over the stomach pouch and gastrojejunal anastomosis.   The liver retractor was removed.  The trocars were removed.  There was no bleeding at any trocar site.  The skin at each trocar site was closed with a  5-0 Monocryl suture.  I infiltrated a total about 30 cc of 0.25% Marcaine at the trocar sites.    After the skin incisions were closed with sutures they were painted with Dermabond.  The sponge and needle count were correct at the end of the case.  The patient tolerated the procedure well, was transported to the recovery room in good condition.   Ovidio Kin, MD, [facility] Pager: [phone] Office phone:  [phone]

## 2012-07-05 NOTE — Transfer of Care (Signed)
Immediate Anesthesia Transfer of Care Note  Patient: Brittany [name]  Procedure(s) Performed: Procedure(s) with comments: LAPAROSCOPIC ROUX-EN-Y GASTRIC (N/A) UPPER GI ENDOSCOPY (N/A) LAPAROSCOPIC LYSIS OF ADHESIONS (N/A) - repair of abdominal wall hernia  Patient Location: PACU  Anesthesia Type:General  Level of Consciousness: awake, alert  and oriented  Airway & Oxygen Therapy: Patient Spontanous Breathing and Patient connected to face mask oxygen  Post-op Assessment: Report given to PACU RN and Post -op Vital signs reviewed and stable  Post vital signs: Reviewed and stable  Complications: No apparent anesthesia complications

## 2012-07-05 NOTE — H&P (View-Only) (Signed)
Re:   Brittany [name] DOB:   Oct 17, 1965 MRN:   [id number]  ASSESSMENT AND PLAN: 1.  Morbid obesity - weight - 312, BMI - 45.2  Per the 1991 NIH Consensus Statement, the patient is a candidate for bariatric surgery.  The patient attended our initial information session and reviewed the types of bariatric surgery.    The patient is interested in the Roux en Y Gastric Bypass.  I discussed with the patient the indications and risks of bariatric surgery.  The potential risks of surgery include, but are not limited to, bleeding, infection, leak from the bowel, DVT and PE, open surgery, long term nutrition consequences, and death.  The patient understands the importance of compliance and long term follow-up with our group after surgery.  She has completed her pre op evaluatioin.  She is scheduled for surgery 07/05/2012.  2.  History of migraines  About 2 per year. 3.  Hypertension 4.  Hypercholesterolemia. 5.  Internal hemorrhoids. 6.  Family history of colon cancer - her mother had colon cancer.  No chief complaint on file.  REFERRING PHYSICIAN: Karle Plumber, MD  HISTORY OF PRESENT ILLNESS: Brittany [name] is a 47 y.o. (DOB: Sep 03, 1965)  white female whose primary care physician is Karle Plumber, MD [facility], [facility]) and comes to me for pre op visit for bariatric surgery.  She is for RYGB 07/05/2012.  She talked about reading "Weight loss surgery for Dummies" and this has helped her.  She has been through about 3 bowel preps the last year, for colon polyps and hemorrhoids.  Dr. Elbert Ewings. [facility], in [facility], is her treating GI doctor.  So when I talked about her pre op bowel prep, she understood.  We again reviewed with her the surgery, the potential complications, and the post op recovery.  We talked about her prior GYN adding to the operation.  I don't think the small HH will change the surgery, but I did talk about looking at that and possible repairing It.  UGI -  04/28/2012 - small sliding HH.   [place] - 04/28/2012 - small splenic cyst. Psych - 05/05/2012 - Saw Dr. Cyndia Skeeters Breath test - 05/19/2012 - negative  Bariatric History: Patient attended the information session the Dr. Ezzard Standing spoke at. She has tried multiple diets including, Weight Watchers, [employer] twice (included Meridia and a Phen/phen type of med), a diet clinic in [place], and [employer] at [employer].  She has had a the most success with Weight Watchers and the Y programs.   She said that her husband is very supportive and I told her that I wanted him to come to the pre op visit.   She has made checklist at home to decide the right operation for her.  She talked a little about her concerns about the surgery.  She talked about fear of failure of the operation in controlling her weight.  She has a cousin (who is younger) who had a sleeve and lives in the [place].  Past Medical History  Diagnosis Date  . Migraine   . Hypertension   . Hypercholesterolemia   . Depression   . Sleep apnea   . Morbid obesity     Past Surgical History  Procedure Laterality Date  . Foot surgery    . Abdominal hysterectomy  1997 & 2006  . Breath tek h pylori  05/03/2012    Procedure: BREATH TEK H PYLORI;  Surgeon: Kandis Cocking, MD;  Location: [facility];  Service: General;  Laterality: N/A;    Current Outpatient Prescriptions  Medication Sig Dispense Refill  . calcium citrate-vitamin D (CITRACAL+D) 315-200 MG-UNIT per tablet Take 2 tablets by mouth daily.      Marland Kitchen estradiol (VIVELLE-DOT) 0.1 MG/24HR Place 0.5 patches onto the skin 2 (two) times a week.        . hydrocortisone (PROCTOZONE-HC) 2.5 % rectal cream Place 1 application rectally daily as needed for hemorrhoids.       . Multiple Vitamin (MULTIVITAMIN WITH MINERALS) TABS Take 1 tablet by mouth daily.      . rosuvastatin (CRESTOR) 20 MG tablet Take 20 mg by mouth daily before breakfast.       . traMADol (ULTRAM) 50 MG tablet Take 50 mg by  mouth every 6 (six) hours as needed for pain.       Marland Kitchen triamterene-hydrochlorothiazide (DYAZIDE) 37.5-25 MG per capsule Take 1 capsule by mouth daily before breakfast.       . Vilazodone HCl (VIIBRYD) 20 MG TABS Take 1 tablet by mouth every evening.       . Vitamin D, Ergocalciferol, (DRISDOL) 50000 UNITS CAPS Take 50,000 Units by mouth every 7 (seven) days.        No current facility-administered medications for this visit.    Allergies  Allergen Reactions  . Amoxicillin Hives   REVIEW OF SYSTEMS: Skin:  No history of rash.  No history of abnormal moles. Infection:  No history of hepatitis or HIV.  No history of MRSA. Neurologic:  History of migraine headaches abotu 2 per year.  Controlled with Imitrex. This all started about 4 years ago, but she is not sure why. Cardiac:  Hypertension x 2 years.  No history of seeing a cardiologist. Pulmonary:  Does not smoke cigarettes.  No asthma or bronchitis.  No OSA/CPAP.  Endocrine:  Hypercholesterolemia x 2 years.  No diabetes. No thyroid disease. Gastrointestinal:  No history of stomach disease.  No history of liver disease.  No history of gall bladder disease.  No history of pancreas disease.  No history of colon disease. Internal hemorrhoids.  History of colonoscopy in 2013.  Her mother had colon cancer. Urologic:  No history of kidney stones.  No history of bladder infections. GYN:  No pregnancies.  She had a right tube/ovary removed by Dr. Ashley Royalty in 1997.  Had hysterectomy 2006 for benign disease. Dr. Nicholas Lose is her Gyn.  Also has had a right oophorectomy. Musculoskeletal:  She's had some back and knee discomfort, but she is not seeing an orthopedist on a regular basis. Hematologic:  No bleeding disorder.  No history of anemia.  Not anticoagulated. Psycho-social:  The patient is oriented.   The patient has no obvious psychologic or social impairment to understanding our conversation and plan.  SOCIAL and FAMILY HISTORY: Married. No biologic  children, but two step daughters who are like her own. 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  HEENT: Normal. Pupils equal. Neck: Supple. No mass.  No thyroid mass. Lymph Nodes:  No supraclavicular or cervical nodes. Lungs: Clear to auscultation and symmetric breath sounds. Heart:  RRR. No murmur or rub. Abdomen: Soft. No mass. No tenderness. No hernia.  Normal bowel sounds.  Lower midline abdominal scar from prior GYN surgery.  A little more pear than apple.  She does have a small panus.  Extremities:  Good strength and ROM  in upper and lower extremities. Neurologic:  Grossly intact to motor and sensory function. Psychiatric: Has normal mood and affect. Behavior is normal.   DATA REVIEWED: Notes from [facility]  Ovidio Kin, MD,  [facility], [place] [address] [place]  [address]   [place], [place]    [address] Phone:  [phone] FAX:  [phone]

## 2012-07-05 NOTE — Anesthesia Postprocedure Evaluation (Signed)
  Anesthesia Post-op Note  Patient: Brittany [name]  Procedure(s) Performed: Procedure(s) (LRB): LAPAROSCOPIC ROUX-EN-Y GASTRIC (N/A) UPPER GI ENDOSCOPY (N/A) LAPAROSCOPIC LYSIS OF ADHESIONS (N/A)  Patient Location: PACU  Anesthesia Type: General  Level of Consciousness: awake and alert   Airway and Oxygen Therapy: Patient Spontanous Breathing  Post-op Pain: mild  Post-op Assessment: Post-op Vital signs reviewed, Patient's Cardiovascular Status Stable, Respiratory Function Stable, Patent Airway and No signs of Nausea or vomiting  Last Vitals:  Filed Vitals:   07/05/12 1830  BP: 149/78  Pulse: 60  Temp:   Resp: 13    Post-op Vital Signs: stable   Complications: No apparent anesthesia complications

## 2012-07-05 NOTE — Interval H&P Note (Signed)
History and Physical Interval Note:  07/05/2012 1:34 PM  Brittany [name]  has presented today for surgery, with the diagnosis of morbid obesity  The various methods of treatment have been discussed with the patient and family.  Husband and daughter are here.  After consideration of risks, benefits and other options for treatment, the patient has consented to  Procedure(s): LAPAROSCOPIC ROUX-EN-Y GASTRIC (N/A) as a surgical intervention .    The patient's history has been reviewed, patient examined, no change in status, stable for surgery.  I have reviewed the patient's chart and labs.  Questions were answered to the patient's satisfaction.     [name] H

## 2012-07-06 ENCOUNTER — Inpatient Hospital Stay (HOSPITAL_COMMUNITY): Payer: 59

## 2012-07-06 ENCOUNTER — Encounter (HOSPITAL_COMMUNITY): Payer: Self-pay | Admitting: Surgery

## 2012-07-06 DIAGNOSIS — Z09 Encounter for follow-up examination after completed treatment for conditions other than malignant neoplasm: Secondary | ICD-10-CM

## 2012-07-06 LAB — CBC WITH DIFFERENTIAL/PLATELET
Eosinophils Relative: 0 % (ref 0–5)
HCT: 38.5 % (ref 36.0–46.0)
Lymphocytes Relative: 8 % — ABNORMAL LOW (ref 12–46)
Lymphs Abs: 0.8 10*3/uL (ref 0.7–4.0)
MCV: 91.9 fL (ref 78.0–100.0)
Monocytes Absolute: 0.4 10*3/uL (ref 0.1–1.0)
RBC: 4.19 MIL/uL (ref 3.87–5.11)
WBC: 9.3 10*3/uL (ref 4.0–10.5)

## 2012-07-06 MED ORDER — CHLORHEXIDINE GLUCONATE 0.12 % MT SOLN
15.0000 mL | Freq: Two times a day (BID) | OROMUCOSAL | Status: DC
  Administered 2012-07-06 – 2012-07-07 (×3): 15 mL via OROMUCOSAL
  Filled 2012-07-06 (×5): qty 15

## 2012-07-06 MED ORDER — BIOTENE DRY MOUTH MT LIQD
15.0000 mL | Freq: Two times a day (BID) | OROMUCOSAL | Status: DC
  Administered 2012-07-06 – 2012-07-07 (×2): 15 mL via OROMUCOSAL

## 2012-07-06 MED ORDER — IOHEXOL 300 MG/ML  SOLN
50.0000 mL | Freq: Once | INTRAMUSCULAR | Status: AC | PRN
  Administered 2012-07-06: 50 mL via ORAL

## 2012-07-06 NOTE — Care Management Note (Signed)
    Vernette 1 of 1   07/06/2012     11:39:48 AM   CARE MANAGEMENT NOTE 07/06/2012  Patient:  Brittany [name], Brittany [name]   Account Number:  [id number]  Date Initiated:  07/06/2012  Documentation initiated by:  Lorenda Ishihara  Subjective/Objective Assessment:   47 yo female admitted s/p gastric bypass and hernia repair. PTA lived at home with spouse.     Action/Plan:   Home when stable   Anticipated DC Date:  07/08/2012   Anticipated DC Plan:  HOME/SELF CARE      DC Planning Services  CM consult      Choice offered to / List presented to:             Status of service:  Completed, signed off Medicare Important Message given?   (If response is "NO", the following Medicare IM given date fields will be blank) Date Medicare IM given:   Date Additional Medicare IM given:    Discharge Disposition:  HOME/SELF CARE  Per UR Regulation:  Reviewed for med. necessity/level of care/duration of stay  If discussed at Long Length of Stay Meetings, dates discussed:    Comments:

## 2012-07-06 NOTE — Progress Notes (Signed)
Patient is alert and oriented.  VSS.  Patient is up ambulating in room, has ambulated in hallway several times.  Patient is burping, denies passing gas or bowel movement.  Patient denies nausea or vomiting.  Patient is using incentive spirometry and wearing compression hose while in bed.  Patient had doppler study this am, no DVT noted.  Patient is scheduled to have UGI this am.  Discharged instructions given to patient for review, will go over in detail prior to discharge.    Quenton Fetter, Rn

## 2012-07-06 NOTE — Progress Notes (Signed)
*  PRELIMINARY RESULTS* Vascular Ultrasound Lower extremity venous duplex has been completed.  Preliminary findings: Bilateral:  No evidence of DVT, superficial thrombosis, or Baker's Cyst.    Farrel Demark, RDMS, RVT  07/06/2012, 8:45 AM

## 2012-07-06 NOTE — Progress Notes (Signed)
General Surgery Note  LOS: 1 day  POD -  1 Day Post-Op  Assessment/Plan: 1.  LAPAROSCOPIC ROUX-EN-Y GASTRIC Bypass, UPPER GI ENDOSCOPY,LAPAROSCOPIC LYSIS OF ADHESIONS, repair of incisional hernia - D. Sparkles Mcneely - 07/05/2012  Await UGI and doppler's  Doing well.  2. DVT prophylaxis - SQ heparin  3. History of migraines   About 2 per year.  4. Hypertension  5. Hypercholesterolemia.  6. Internal hemorrhoids.  7. Family history of colon cancer - her mother had colon cancer.  Subjective:  Doing well so far.  No nausea.  Has walked some. Objective:   Filed Vitals:   07/06/12 0643  BP: 114/72  Pulse: 88  Temp: 98.9 F (37.2 C)  Resp: 14     Intake/Output from previous day:  03/24 0701 - 03/25 0700 In: 4062.5 [I.V.:3862.5; IV Piggyback:200] Out: 685 [Urine:635; Blood:50]  Intake/Output this shift:      Physical Exam:   General: Obese WF who is alert and oriented.    HEENT: Normal. Pupils equal. .   Lungs: Clear   Abdomen: Soft.   Wound: Look clean.   Lab Results:    Recent Labs  07/06/12 0423  WBC 9.3  HGB 13.7  HCT 38.5  PLT 281    BMET  No results found for this basename: NA, K, CL, CO2, GLUCOSE, BUN, CREATININE, CALCIUM,  in the last 72 hours  PT/INR  No results found for this basename: LABPROT, INR,  in the last 72 hours  ABG  No results found for this basename: PHART, PCO2, PO2, HCO3,  in the last 72 hours   Studies/Results:  No results found.   Anti-infectives:   Anti-infectives   Start     Dose/Rate Route Frequency Ordered Stop   07/05/12 1052  cefOXitin (MEFOXIN) 2 g in dextrose 5 % 50 mL IVPB     2 g 100 mL/hr over 30 Minutes Intravenous On call to O.R. 07/05/12 1052 07/05/12 1405      Ovidio Kin, MD, FACS Pager: [phone],   [facility] [place] [facility] Office: [phone] 07/06/2012

## 2012-07-07 LAB — CBC WITH DIFFERENTIAL/PLATELET
Eosinophils Absolute: 0.1 10*3/uL (ref 0.0–0.7)
Eosinophils Relative: 1 % (ref 0–5)
Hemoglobin: 11.8 g/dL — ABNORMAL LOW (ref 12.0–15.0)
Lymphs Abs: 2.1 10*3/uL (ref 0.7–4.0)
MCH: 31.8 pg (ref 26.0–34.0)
MCV: 93.8 fL (ref 78.0–100.0)
Monocytes Relative: 7 % (ref 3–12)
RBC: 3.71 MIL/uL — ABNORMAL LOW (ref 3.87–5.11)

## 2012-07-07 MED ORDER — OXYCODONE-ACETAMINOPHEN 5-325 MG/5ML PO SOLN: 5.0000 mL | ORAL | Status: DC | PRN

## 2012-07-07 NOTE — Progress Notes (Signed)
Patient is alert and oriented.  VSS.  Patient is up ambulating in hallway and continues to use incentive spirometry.  Patient is belching, passing gas, and has had a Bm x1.  Patient tolerated water and ice chips with minimal nausea, no vomiting.  Patient advanced to protein shakes.  Patient has minimal abdominal pain that is relieved with prn medication.  Patient has follow up appointments with [facility] and [facility].  Patient is aware of hospital support group and BELT program.  The following discharge instructions listed below reviewed with patient in detail, patient verbalized understanding.    Aric Jost, Rn  GASTRIC BYPASS / SLEEVE  Home Care Instructions  These instructions are to help you care for yourself when you go home.  Call: If you have any problems.   Call [phone] and ask for the surgeon on call   If you need immediate assistance come to the ER at [facility]. Tell the ER staff that you are a new post-op gastric bypass or gastric sleeve patient   Signs and symptoms to report:   Severe vomiting or nausea o If you cannot handle clear liquids for longer than 1 day, call your surgeon    Abdominal pain which does not get better after taking your pain medication   Fever greater than 100.4 F and chills   Heart rate over 100 beats a minute   Trouble breathing   Chest pain    Redness, swelling, drainage, or foul odor at incision (surgical) sites    If your incisions open or pull apart   Swelling or pain in calf (lower leg)   Diarrhea (Loose bowel movements that happen often), frequent watery, uncontrolled bowel movements   Constipation, (no bowel movements for 3 days) if this happens:  o Take Milk of Magnesia, 2 tablespoons by mouth, 3 times a day for 2 days if needed o Stop taking Milk of Magnesia once you have had a bowel movement o Call your doctor if constipation continues Or o Take Miralax  (instead of Milk of Magnesia) following the label instructions o Stop taking Miralax once  you have had a bowel movement o Call your doctor if constipation continues   Anything you think is "abnormal for you"   Normal side effects after surgery:   Unable to sleep at night or unable to concentrate   Irritability   Being tearful (crying) or depressed These are common complaints, possibly related to your anesthesia, stress of surgery and change in lifestyle, that usually go away a few weeks after surgery.  If these feelings continue, call your medical doctor.  Wound Care: You may have surgical glue, steri-strips, or staples over your incisions after surgery   Surgical glue:  Looks like a clear film over your incisions and will wear off a little at a time   Steri-strips : Adhesive strips of tape over your incisions. You may notice a yellowish color on the skin under the steri-strips. This is used to make the   steri-strips stick better. Do not pull the steri-strips off - let them fall off   Staples: Staples may be removed before you leave the hospital o If you go home with staples, call [facility] at for an appointment with your surgeon's nurse to have staples removed 10 days after surgery, [phone]   Showering: You may shower two (2) days after your surgery unless your surgeon tells you differently o Wash gently around incisions with warm soapy water, rinse well, and gently  pat dry  o If you have a drain (tube from your incision), you may need someone to hold this while you shower  o No tub baths until staples are removed and incisions are healed     Medications:   Medications should be liquid or crushed if larger than the size of a dime   Extended release pills (medication that releases a little bit at a time through the day) should not be crushed   Depending on the size and number of medications you take, you may need to space (take a few throughout the day)/change the time you take your medications so that you do not over-fill your pouch (smaller stomach)   Make  sure you follow-up with your primary care physician to make medication changes needed during rapid weight loss and life-style changes   If you have diabetes, follow up with the doctor that orders your diabetes medication(s) within one week after surgery and check your blood sugar regularly.   Do not drive while taking narcotics (pain medications)   Do not take acetaminophen (Tylenol) and Roxicet or Lortab Elixir at the same time since these pain medications contain acetaminophen  Diet:                    First 2 Weeks  You will see the nutritionist about two (2) weeks after your surgery. The nutritionist will increase the types of foods you can eat if you are handling liquids well:   If you have severe vomiting or nausea and cannot handle clear liquids lasting longer than 1 day, call your surgeon  Protein Shake   Drink at least 2 ounces of shake 5-6 times per day   Each serving of protein shakes (usually 8 - 12 ounces) should have a minimum of:  o 15 grams of protein  o And no more than 5 grams of carbohydrate    Goal for protein each day: o Men = 80 grams per day o Women = 60 grams per day   Protein powder may be added to fluids such as non-fat milk or Lactaid milk or Soy milk (limit to 35 grams added protein powder per serving)  Hydration   Slowly increase the amount of water and other clear liquids as tolerated (See Acceptable Fluids)   Slowly increase the amount of protein shake as tolerated     Sip fluids slowly and throughout the day   May use sugar substitutes in small amounts (no more than 6 - 8 packets per day; i.e. Splenda)  Fluid Goal   The first goal is to drink at least 8 ounces of protein shake/drink per day (or as directed by the nutritionist); some examples of protein shakes are [employer], Dillard's, EAS Edge HP, and Unjury. See handout from pre-op Bariatric Education Class: o Slowly increase the amount of protein shake you drink as tolerated o You may find it  easier to slowly sip shakes throughout the day o It is important to get your proteins in first   Your fluid goal is to drink 64 - 100 ounces of fluid daily o It may take a few weeks to build up to this   32 oz (or more) should be clear liquids  And    32 oz (or more) should be full liquids (see below for examples)   Liquids should not contain sugar, caffeine, or carbonation  Clear Liquids:   Water or Sugar-free flavored water (i.e. Fruit H2O, Propel)  Decaffeinated coffee or tea (sugar-free)   Crystal Lite, Wyler's Lite, Minute Maid Lite   Sugar-free Jell-O   Bouillon or broth   Sugar-free Popsicle:   *Less than 20 calories each; Limit 1 per day  Full Liquids: Protein Shakes/Drinks + 2 choices per day of other full liquids   Full liquids must be: o No More Than 12 grams of Carbs per serving  o No More Than 3 grams of Fat per serving   Strained low-fat cream soup   Non-Fat milk   Fat-free Lactaid Milk   Sugar-free yogurt (Dannon Lite & Fit, Greek yogurt)      Vitamins and Minerals   Start 1 day after surgery unless otherwise directed by your surgeon   2 Chewable Multivitamin / Multimineral Supplement with iron (i.e. Centrum for Adults)   Vitamin B-12, 350 - 500 micrograms sub-lingual (place tablet under the tongue) each day   Chewable Calcium Citrate with Vitamin D-3 (Example: 3 Chewable Calcium Plus 600 with Vitamin D-3) o Take 500 mg three (3) times a day for a total of 1500 mg each day o Do not take all 3 doses of calcium at one time as it may cause constipation, and you can only absorb 500 mg  at a time  o Do not mix multivitamins containing iron with calcium supplements; take 2 hours apart o Do not substitute Tums (calcium carbonate) for your calcium   Menstruating women and those at risk for anemia (a blood disease that causes weakness) may need extra iron o Talk with your doctor to see if you need more iron   If you need extra iron: Total daily Iron recommendation  (including Vitamins) is 50 to 100 mg Iron/day   Do not stop taking or change any vitamins or minerals until you talk to your nutritionist or surgeon   Your nutritionist and/or surgeon must approve all vitamin and mineral supplements   Activity and Exercise: It is important to continue walking at home.  Limit your physical activity as instructed by your doctor.  During this time, use these guidelines:   Do not lift anything greater than ten (10) pounds for at least two (2) weeks   Do not go back to work or drive until Designer, industrial/product says you can   You may have sex when you feel comfortable  o It is VERY important for female patients to use a reliable birth control method; fertility often increases after surgery  o Do not get pregnant for at least 18 months   Start exercising as soon as your doctor tells you that you can o Make sure your doctor approves any physical activity   Start with a simple walking program   Walk 5-15 minutes each day, 7 days per week.    Slowly increase until you are walking 30-45 minutes per day Consider joining our BELT program. [phone] or email [email or web address]   Special Instructions Things to remember:   Free counseling is available for you and your family through collaboration between [facility] and [facility]. Please call [phone] and leave a message   Use your CPAP when sleeping if this applies to you    Consider buying a medical alert bracelet that says you had lap-band surgery    You will likely have your first fill (fluid added to your band) 6 - 8 weeks after surgery   [facility] has a free Bariatric Surgery Support Group that meets monthly, the 3rd Thursday, 6 pm, [employer]  Center Classrooms You can see classes online at [email or web address]   It is very important to keep all follow up appointments with your surgeon, nutritionist, primary care physician, and behavioral health practitioner o After the first year, please follow up  with your bariatric surgeon and nutritionist at least once a year in order to maintain best weight loss results [facility]: [phone] [facility]: [phone] Bariatric Nurse Coordinator: [phone]   Reviewed and Endorsed  by [facility] Patient Education Committee, Jan, 2014

## 2012-07-07 NOTE — Discharge Summary (Signed)
Physician Discharge Summary  Patient ID:  Brittany [name]  MRN: [id number]  DOB/AGE: [birth year] 47 y.o.  Admit date: 07/05/2012 Discharge date: 07/07/2012  Discharge Diagnoses:  1.  Morbid obesity.   (weight 312, BMI of 45.2).  2. History of migraines   About 2 per year.  3. Hypertension  4. Hypercholesterolemia.  5. Internal hemorrhoids.  6. Family history of colon cancer - her mother had colon cancer.  Operation: Procedure(s): LAPAROSCOPIC ROUX-EN-Y GASTRIC Bypass, UPPER GI ENDOSCOPY, LAPAROSCOPIC LYSIS OF ADHESIONS, and repair of abdominal ventral incisional hernia on 07/05/2012  Discharged Condition: good  Hospital Course: Mirella DONIA YOKUM is an 47 y.o. female whose primary care physician is Karle Plumber, MD and who was admitted 07/05/2012 with a chief complaint of morbid obesity. She was brought to the operating room on 07/05/2012 and underwent  LAPAROSCOPIC ROUX-EN-Y GASTRIC Bypass, UPPER GI ENDOSCOPY, LAPAROSCOPIC LYSIS OF ADHESIONS, and repair of abdominal ventral incisional hernia .   Post op she has done well.  Her UGI on 07/06/2012 was normal post op.  The dopplers of her lower extremities were negative.  She has tolerated water.  She has had a little nausea.  She will start protein drinks today, and, if tolerated, will go home.  Consults: None  Significant Diagnostic Studies: Results for orders placed during the hospital encounter of 07/05/12  CBC WITH DIFFERENTIAL      Result Value Range   WBC 9.3  4.0 - 10.5 K/uL   RBC 4.19  3.87 - 5.11 MIL/uL   Hemoglobin 13.7  12.0 - 15.0 g/dL   HCT 78.2  95.6 - 21.3 %   MCV 91.9  78.0 - 100.0 fL   MCH 32.7  26.0 - 34.0 pg   MCHC 35.6  30.0 - 36.0 g/dL   RDW 08.6  57.8 - 46.9 %   Platelets 281  150 - 400 K/uL   Neutrophils Relative 88 (*) 43 - 77 %   Neutro Abs 8.2 (*) 1.7 - 7.7 K/uL   Lymphocytes Relative 8 (*) 12 - 46 %   Lymphs Abs 0.8  0.7 - 4.0 K/uL   Monocytes Relative 4  3 - 12 %   Monocytes Absolute 0.4  0.1 - 1.0 K/uL    Eosinophils Relative 0  0 - 5 %   Eosinophils Absolute 0.0  0.0 - 0.7 K/uL   Basophils Relative 0  0 - 1 %   Basophils Absolute 0.0  0.0 - 0.1 K/uL  HEMOGLOBIN AND HEMATOCRIT, BLOOD      Result Value Range   Hemoglobin 12.4  12.0 - 15.0 g/dL   HCT 62.9 (*) 52.8 - 41.3 %  CBC WITH DIFFERENTIAL      Result Value Range   WBC 10.0  4.0 - 10.5 K/uL   RBC 3.71 (*) 3.87 - 5.11 MIL/uL   Hemoglobin 11.8 (*) 12.0 - 15.0 g/dL   HCT 24.4 (*) 01.0 - 27.2 %   MCV 93.8  78.0 - 100.0 fL   MCH 31.8  26.0 - 34.0 pg   MCHC 33.9  30.0 - 36.0 g/dL   RDW 53.6  64.4 - 03.4 %   Platelets 248  150 - 400 K/uL   Neutrophils Relative 72  43 - 77 %   Neutro Abs 7.1  1.7 - 7.7 K/uL   Lymphocytes Relative 21  12 - 46 %   Lymphs Abs 2.1  0.7 - 4.0 K/uL   Monocytes Relative 7  3 - 12 %  Monocytes Absolute 0.7  0.1 - 1.0 K/uL   Eosinophils Relative 1  0 - 5 %   Eosinophils Absolute 0.1  0.0 - 0.7 K/uL   Basophils Relative 0  0 - 1 %   Basophils Absolute 0.0  0.0 - 0.1 K/uL    Dg Chest 2 View  06/30/2012  *RADIOLOGY REPORT*  Clinical Data: Preop  CHEST - 2 VIEW  Comparison: None.  Findings: Normal heart size.  Clear lungs.  No pleural effusion or pneumothorax.  IMPRESSION: No active cardiopulmonary disease.   Original Report Authenticated By: Jolaine Click, M.D.    Dg Kayleen Memos W/water Sol Cm  07/06/2012  *RADIOLOGY REPORT*  Clinical Data:  Postoperative status.  History of laparoscopic gastric roux-en-Y  UPPER GI SERIES WITHOUT KUB  Technique:  Limited upper GI series was performed with  30 ml of Omnipaque 300  Fluoroscopy Time: 0.6 minutes  Comparison:  None.  Findings: On the scout abdominal radiographic image there is a mild gaseous distention of large small bowel.  There is slight scoliosis convexity to the left.  There is marginal osteophyte formation at multiple levels representing degenerative spondylosis.  The patient had no difficulty breathing with water soluble contrast.  The small gastric pouch is identified.   There is prompt flow of contrast through the gastrojejunal anastomosis without evidence of obstruction.  IMPRESSION: The patient had no difficulty breathing with water soluble contrast.  The small gastric pouch is identified.  There is prompt flow of contrast through the gastrojejunal anastomosis without evidence of obstruction.   Original Report Authenticated By: Onalee Hua Call    Discharge Exam:  Filed Vitals:   07/07/12 0600  BP: 108/73  Pulse: 88  Temp: 98.7 F (37.1 C)  Resp: 16    General: WN obese WF who is alert and generally healthy appearing.  Lungs: Clear to auscultation and symmetric breath sounds. Heart:  RRR. No murmur or rub. Abdomen: Soft. No mass. No tenderness. No hernia. Normal bowel sounds. Her incisions look good. Extremities:  Good strength and ROM  in upper and lower extremities.  Discharge Medications:     Medication List    TAKE these medications       calcium citrate-vitamin D 315-200 MG-UNIT per tablet  Commonly known as:  CITRACAL+D  Take 2 tablets by mouth daily. Patient takes in liquid form     estradiol 0.1 MG/24HR  Commonly known as:  VIVELLE-DOT  Place 0.5 patches onto the skin 2 (two) times a week. Patient cuts in half  Monday and Thursday     meclizine 25 MG tablet  Commonly known as:  ANTIVERT  Take 25 mg by mouth 3 (three) times daily as needed.     multivitamin with minerals Tabs  Take 1 tablet by mouth daily.     ondansetron 4 MG tablet  Commonly known as:  ZOFRAN  Take 4 mg by mouth every 8 (eight) hours as needed for nausea.     oxyCODONE-acetaminophen 5-325 MG/5ML solution  Commonly known as:  ROXICET  Take 5 mLs by mouth every 4 (four) hours as needed for pain.     PROCTOZONE-HC 2.5 % rectal cream  Generic drug:  hydrocortisone  Place 1 application rectally daily as needed for hemorrhoids.     rosuvastatin 20 MG tablet  Commonly known as:  CRESTOR  Take 20 mg by mouth daily before breakfast.     traMADol 50 MG tablet   Commonly known as:  ULTRAM  Take 50 mg by mouth  every 6 (six) hours as needed for pain.     triamterene-hydrochlorothiazide 37.5-25 MG per capsule  Commonly known as:  DYAZIDE  Take 1 capsule by mouth daily before breakfast.     VIIBRYD 20 MG Tabs  Generic drug:  Vilazodone HCl  Take 1 tablet by mouth every evening.     Vitamin D (Ergocalciferol) 50000 UNITS Caps  Commonly known as:  DRISDOL  Take 50,000 Units by mouth every 7 (seven) days. Mondays        Disposition: 01-Home or Self Care      Discharge Orders   Future Appointments Provider Department Dept Phone   07/20/2012 4:00 PM [facility] Post-Op Class [facility] [phone]   07/23/2012 4:00 PM Kandis Cocking, MD [facility], [place] [phone]   Future Orders Complete By Expires     Increase activity slowly  As directed       CENTRAL  SURGERY - DISCHARGE INSTRUCTIONS TO PATIENT  Activity:  Driving - May drive in 2 or 3 days, if doing well.   Lifting - No lifting > 15 pounds x 1 week, then no limit.  Wound Care:   May shower  Diet:  Post op Gastric Bypass diet  Follow up appointment:  You have an appt with Dr. Ezzard Standing and the nutritionist in about 2 weeks.  Call Dr. Allene Pyo office [facility]) at [phone] for any questions.  Medications and dosages:  Resume your home medications.  Take one pill at a time.  You have a prescription for:  Roxicet elixir.  Signed: Ovidio Kin, M.D., FACS  07/07/2012, 7:28 AM

## 2012-07-07 NOTE — Progress Notes (Signed)
Pt for d/c home today. IV d/c'd. Dermabond sites to abdomen CDI. Tolerated Vanilla protein shake as claimed. No N/V noted. Ambulates w/o any problem. Passed gas & had a small BM this am. D/C instructions & Rx given with verbalized understanding. Husband to assist pt with d/c.

## 2012-07-13 ENCOUNTER — Ambulatory Visit: Payer: 59

## 2012-07-20 ENCOUNTER — Encounter: Payer: 59 | Attending: Surgery | Admitting: *Deleted

## 2012-07-20 DIAGNOSIS — Z01818 Encounter for other preprocedural examination: Secondary | ICD-10-CM | POA: Insufficient documentation

## 2012-07-20 DIAGNOSIS — Z713 Dietary counseling and surveillance: Secondary | ICD-10-CM | POA: Insufficient documentation

## 2012-07-23 ENCOUNTER — Ambulatory Visit (INDEPENDENT_AMBULATORY_CARE_PROVIDER_SITE_OTHER): Payer: 59 | Admitting: Surgery

## 2012-07-23 NOTE — Progress Notes (Addendum)
Re:   Brittany [name] DOB:   07-20-1965 MRN:   [id number]  ASSESSMENT AND PLAN: 1.  Morbid obesity - weight - 312, BMI - 45.2  RYGB (and repair of ventral hernia) - 07/05/2012.  She has done well.    To see in 3 months.  She will get labs done 1 week before seeing me.  It is convenient for her to get her labs her PCP.  2.  History of migraines  About 2 per year. 3.  Hypertension 4.  Hypercholesterolemia. 5.  Internal hemorrhoids. 6.  Family history of colon cancer - her mother had colon cancer.  Chief Complaint  Patient presents with  . Bariatric Follow Up   REFERRING PHYSICIAN: Karle Plumber, [name]  HISTORY OF PRESENT ILLNESS: Brittany [name] is a 47 y.o. (DOB: 09-Jun-1965)  white female whose primary care physician is Brittany [name] [facility]) and comes to me for post op visit for RYGB 07/05/2012.  She is doing well.  She thinks that she had one episode of dumping after eating an egg.  Otherwise food has done well, she just cannot eat much.  She found out that she cannot take water from a water bottle.  We talked about exercise - goal of walking the equivalent of 3 miles / day x 5 days per week.  She is going to go back to work on 08/05/2012.  Her granddaughter's bday is tomorrow.   Bariatric History: Patient attended the information session the Brittany [name]. She has tried multiple diets including, Weight Watchers, Medi spa twice (included Meridia and a Phen/phen type of med), a diet clinic in [place], and [employer] [name] [employer].  She has had a the most success with Weight Watchers and the Y programs.   She said that her husband is very supportive and I told her that I wanted him to come to the pre op visit.   She has made checklist [name] home to decide the right operation for her.  She talked a little about her concerns about the surgery.  She talked about fear of failure of the operation in controlling her weight.  She has a cousin (who is  younger) who had a sleeve and lives in the [place].  Past Medical History  Diagnosis Date  . Migraine   . Hypertension   . Hypercholesterolemia   . Depression   . Morbid obesity   . Sleep apnea     no CPAP machine   . H/O hiatal hernia   . GERD (gastroesophageal reflux disease)     hx of   . Arthritis     hips, knees and hands       Current Outpatient Prescriptions  Medication Sig Dispense Refill  . calcium citrate-vitamin D (CITRACAL+D) 315-200 MG-UNIT per tablet Take 2 tablets by mouth daily. Patient takes in liquid form      . estradiol (VIVELLE-DOT) 0.1 MG/24HR Place 0.5 patches onto the skin 2 (two) times a week. Patient cuts in half  Monday and Thursday      . GAVILYTE-G 236 G solution       . hydrocortisone (PROCTOZONE-HC) 2.5 % rectal cream Place 1 application rectally daily as needed for hemorrhoids.       . meclizine (ANTIVERT) 25 MG tablet Take 25 mg by mouth 3 (three) times daily as needed.      . Multiple Vitamin (MULTIVITAMIN WITH MINERALS) TABS Take 1 tablet by mouth daily.      Marland Kitchen  ondansetron (ZOFRAN) 4 MG tablet Take 4 mg by mouth every 8 (eight) hours as needed for nausea.      Marland Kitchen oxyCODONE-acetaminophen (ROXICET) 5-325 MG/5ML solution Take 5 mLs by mouth every 4 (four) hours as needed for pain.  200 mL  0  . rosuvastatin (CRESTOR) 20 MG tablet Take 20 mg by mouth daily before breakfast.       . traMADol (ULTRAM) 50 MG tablet Take 50 mg by mouth every 6 (six) hours as needed for pain.       Marland Kitchen triamterene-hydrochlorothiazide (DYAZIDE) 37.5-25 MG per capsule Take 1 capsule by mouth daily before breakfast.       . Vilazodone HCl (VIIBRYD) 20 MG TABS Take 1 tablet by mouth every evening.       . Vitamin D, Ergocalciferol, (DRISDOL) 50000 UNITS CAPS Take 50,000 Units by mouth every 7 (seven) days. Mondays       No current facility-administered medications for this visit.    Allergies  Allergen Reactions  . Amoxicillin Hives   REVIEW OF SYSTEMS: Neurologic:   History of migraine headaches abotu 2 per year.  Controlled with Imitrex. This all started about 4 years ago, but she is not sure why. Cardiac:  Hypertension x 2 years.  No history of seeing a cardiologist. Endocrine:  Hypercholesterolemia x 2 years.  No diabetes. No thyroid disease. Gastrointestinal:  No history of stomach disease.  No history of liver disease.  No history of gall bladder disease.  No history of pancreas disease.  No history of colon disease. Internal hemorrhoids.  History of colonoscopy in 2013.  Her mother had colon cancer.  Dr. Elbert Ewings. [facility], in [facility], is her treating GI doctor  GYN:  No pregnancies.  She had a right tube/ovary removed by Dr. Ashley Royalty in 1997.  Had hysterectomy 2006 for benign disease. Dr. Nicholas Lose is her Gyn.  Also has had a right oophorectomy. Musculoskeletal:  She's had some back and knee discomfort, but she is not seeing an orthopedist on a regular basis. Psycho-social:  The patient is oriented.   Psych - 05/05/2012 - Saw Dr. Cyndia Skeeters  SOCIAL and FAMILY HISTORY: Married. No biologic children, but two step daughters who are like her own. She works for [employer] - in home bankruptcy  PHYSICAL EXAM: BP 123/84  Pulse 64  Temp(Src) 98.1 F (36.7 C) (Temporal)  Resp 16  Ht 5\' 10"  (1.778 m)  Wt 277 lb 12.8 oz (126.009 kg)  BMI 39.86 kg/m2  General: WN obese WF who is alert and generally healthy appearing.  Lungs: Clear to auscultation and symmetric breath sounds. Heart:  RRR. No murmur or rub. Abdomen: Soft. No mass.  Normal bowel sounds.  Lower midline abdominal scar from prior GYN surgery.  She does have a small panus.  Incisions look good.  DATA REVIEWED: Epic notes  Ovidio Kin, [name],  [facility], [place] [address] [place]  [address]   [place], [place]    [address] Phone:  [phone] FAX:  [phone]

## 2012-08-08 NOTE — Progress Notes (Signed)
Bariatric Class:  Appt start time: 1600 end time:  1700.  2 Week Post-Operative Nutrition Class  Patient was seen on 07/20/12 for Post-Operative [facility].   Surgery date: 07/05/12  Surgery type: RYGB  Start weight at [facility]: 312.5 lbs (04/17/12) Pre-Op Class weight: 313.2 lbs (06/17/12)  Weight today: 280.0 lbs  Weight change: 33.2 lbs Total weight lost:  33.2 lbs BMI: 40.2  TANITA  BODY COMP RESULTS  07/20/12   BMI (kg/m^2) 40.2   Fat Mass (lbs) 163.0   Fat Free Mass (lbs) 117.0   Total Body Water (lbs) 85.5   The following the learning objectives were met by the patient during this course:  Identifies Phase 3A (Soft, High Proteins) Dietary Goals and will begin from 2 weeks post-operatively to 2 months post-operatively  Identifies appropriate sources of fluids and proteins   States protein recommendations and appropriate sources post-operatively  Identifies the need for appropriate texture modifications, mastication, and bite sizes when consuming solids  Identifies appropriate multivitamin and calcium sources post-operatively  Describes the need for physical activity post-operatively and will follow MD recommendations  States when to call healthcare provider regarding medication questions or post-operative complications  Handouts given during class include:  Phase 3A: Soft, High Protein Diet Handout  Follow-Up Plan: Patient will follow-up at [facility] in 6 weeks for 2 months post-op nutrition visit for diet advancement per MD.

## 2012-08-08 NOTE — Patient Instructions (Signed)
Patient to follow Phase 3A-Soft, High Protein Diet and follow-up at [facility] in 6 weeks for 2 months post-op nutrition visit for diet advancement. 

## 2012-08-30 ENCOUNTER — Encounter: Payer: Self-pay | Admitting: *Deleted

## 2012-08-30 ENCOUNTER — Encounter: Payer: 59 | Attending: Surgery | Admitting: *Deleted

## 2012-08-30 DIAGNOSIS — Z713 Dietary counseling and surveillance: Secondary | ICD-10-CM | POA: Insufficient documentation

## 2012-08-30 DIAGNOSIS — Z01818 Encounter for other preprocedural examination: Secondary | ICD-10-CM | POA: Insufficient documentation

## 2012-08-30 NOTE — Progress Notes (Addendum)
Follow-up visit:  8 Weeks Post-Operative RYGB Surgery  Medical Nutrition Therapy:  Appt start time: 0800 end time:  0830.  Primary concerns today: Post-operative Bariatric Surgery Nutrition Management. Doing very well. Was out of town with family this weekend wehre there were sweets and other poor food choices. Craved the cheesecake, but went outside to take her mind off it. Also reports extreme fatigue, though is getting better. Brittany [name] has an excellent support system and is doing very well.   Surgery date: 07/05/12  Surgery type: RYGB  Start weight at [facility]: 312.5 lbs (04/17/12) Pre-Op Class weight: 313.2 lbs (06/17/12)  Weight today: 263.5 lbs  Weight change: 16.5 lbs Total weight lost: 49.7 lbs  Goal weight: 185 lbs   TANITA  BODY COMP RESULTS  07/20/12 08/30/12   BMI (kg/m^2) 40.2 37.8   Fat Mass (lbs) 163.0 145.5   Fat Free Mass (lbs) 117.0 118.0   Total Body Water (lbs) 85.5 86.5   24-hr recall: B (9 AM): Dannon L&F greek (13g) Snk (AM): NONE  L (12 PM): 2-3 oz lean protein, green beans or zucchini (15-20g) Snk (PM): Premier protein shake (30g)  D (6-6:30 PM): 2-3 oz Grilled chicken, green beans (15-20g) Snk (PM): NONE (b/c eats dinner later) OR string cheese/cottage cheese  Fluid intake:  50-70 oz Estimated total protein intake: 75-90g  Medications: See med list. Reconciled with pt Supplementation: Taking regularly  Using straws: No Drinking while eating: No Hair loss: No Carbonated beverages:  No N/V/D/C: Vomiting with 85/15% hamburger patty, eggs, green beans.  Seafood is ok.  Dumping syndrome: None  Recent physical activity:  No consistent exercise at this time  Progress Towards Goal(s):  In progress.  Handouts given during visit include:  Phase 3B: High Protein + Non-Starchy Vegetables  Samples given during visit include:   Unjury Protein Powder: 3 pkts Lot: [id number]; Exp: 09/15    Nutritional Diagnosis:  [place]-3.3 Overweight/obesity related to past poor dietary  habits and physical inactivity as evidenced by patient w/ recent RYGB surgery following dietary guidelines for continued weight loss.  Intervention:  Nutrition education/diet advancement.  Monitoring/Evaluation:  Dietary intake, exercise, and body weight. Follow up in 1 months for 3 month post-op visit.

## 2012-08-30 NOTE — Patient Instructions (Addendum)
Goals:  Follow Phase 3B: High Protein + Non-Starchy Vegetables  Eat 3-6 small meals/snacks, every 3-5 hrs  Increase lean protein foods to meet 60-80g goal  Increase fluid intake to 64oz +  Avoid drinking 15 minutes before, during and 30 minutes after eating  Aim for >30 min of physical activity daily  TANITA  BODY COMP RESULTS  07/20/12 08/30/12   BMI (kg/m^2) 40.2 37.8   Fat Mass (lbs) 163.0 145.5   Fat Free Mass (lbs) 117.0 118.0   Total Body Water (lbs) 85.5 86.5

## 2012-10-06 ENCOUNTER — Encounter: Payer: 59 | Attending: Surgery | Admitting: *Deleted

## 2012-10-06 ENCOUNTER — Encounter: Payer: Self-pay | Admitting: *Deleted

## 2012-10-06 DIAGNOSIS — Z713 Dietary counseling and surveillance: Secondary | ICD-10-CM | POA: Insufficient documentation

## 2012-10-06 DIAGNOSIS — Z01818 Encounter for other preprocedural examination: Secondary | ICD-10-CM | POA: Insufficient documentation

## 2012-10-06 NOTE — Progress Notes (Signed)
Follow-up visit:  12 Weeks Post-Operative RYGB Surgery  Medical Nutrition Therapy:  Appt start time: 0430 end time:  0500.  Primary concerns today: Post-operative Bariatric Surgery Nutrition Management. Reports daily episodes of hypotension. Advised to call PCP immediately. Following all other nutritional guidelines without issues.   Surgery date: 07/05/12  Surgery type: RYGB  Start weight at [facility]: 312.5 lbs (04/17/12) Pre-Op Class weight: 313.2 lbs (06/17/12)  Weight today: 249.5 lbs  Weight change: 14.0 lbs Total weight lost: 63.7 lbs  Goal weight: 185 lbs  % goal met: 50%  TANITA  BODY COMP RESULTS  07/20/12 08/30/12 10/06/12   BMI (kg/m^2) 40.2 37.8 35.8   Fat Mass (lbs) 163.0 145.5 127.5   Fat Free Mass (lbs) 117.0 118.0 122.0   Total Body Water (lbs) 85.5 86.5 89.5   24-hr recall: B (9 AM): Dannon L&F greek yogurt (13g) Snk (AM): NONE  L (12 PM): 2-3 oz lean protein, green beans or zucchini (15-20g) Snk (PM): Premier protein shake (30g)  D (6-6:30 PM): 2-3 oz Grilled chicken, green beans (15-20g) Snk (PM): NONE  Fluid intake:  Water w/ Mio or Koolaid; Decaf coffee; Crystal light; Protein shake (6-12 oz): 45-70 oz Estimated total protein intake: 75-90g  Medications: See med list. Reconciled with pt Supplementation: Taking regularly  Using straws: No Drinking while eating: No Hair loss: No Carbonated beverages:  No N/V/D/C:  Tolerates seafood is ok. 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  Samples given during visit:   Celebrate Vitamins  Iron + C (18 mg): 4 tabs Lot: [id number]; Exp: 03/15  Iron + C (30 mg): 4 tabs Lot:  [id number]; Exp: 01/16    Nutritional Diagnosis:  [place]-3.3 Overweight/obesity related to past poor dietary habits and physical inactivity as evidenced by patient w/  recent RYGB surgery following dietary guidelines for continued weight loss.  Intervention:  Nutrition education/diet advancement.  Monitoring/Evaluation:  Dietary intake, exercise, and body weight. Follow up in 3 months for 6 month post-op visit.

## 2012-10-06 NOTE — Patient Instructions (Addendum)
Goals:  Follow Phase 3B: High Protein + Non-Starchy Vegetables  Increase lean protein foods to meet 60-80g goal  Increase fluid intake to 64oz +  Add 15 grams of carbohydrate (fruit or whole grains) with 1-2 meals or snacks  Always have protein with carbs to slow breakdown  Limit bananas and watermelon as they are 2 of the fasted digesting fruits  Aim for >30 min of physical activity daily  Add iron supplement - can get over the counter 325 mg (ferrous fumerate or ferrous sulfate)   TANITA  BODY COMP RESULTS  07/20/12 08/30/12 10/06/12   BMI (kg/m^2) 40.2 37.8 35.8   Fat Mass (lbs) 163.0 145.5 127.5   Fat Free Mass (lbs) 117.0 118.0 122.0   Total Body Water (lbs) 85.5 86.5 89.5

## 2012-11-11 ENCOUNTER — Ambulatory Visit (INDEPENDENT_AMBULATORY_CARE_PROVIDER_SITE_OTHER): Payer: 59 | Admitting: Surgery

## 2012-11-11 ENCOUNTER — Encounter (INDEPENDENT_AMBULATORY_CARE_PROVIDER_SITE_OTHER): Payer: Self-pay | Admitting: Surgery

## 2012-11-11 DIAGNOSIS — Z9884 Bariatric surgery status: Secondary | ICD-10-CM | POA: Insufficient documentation

## 2012-11-11 NOTE — Progress Notes (Signed)
Re:   Brittany [name] DOB:   14-Sep-1965 MRN:   [id number]  ASSESSMENT AND PLAN: 1.  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  She has done well.    To see in 3 months. She is going to get her labs through her PCP.  We gave her our list of labs that we like to check. 2.  History of migraines  About 2 per year. 3.  Hypertension - better 4.  Hypercholesterolemia. 5.  Internal hemorrhoids. 6.  Family history of colon cancer - her mother had colon cancer.  Chief Complaint  Patient presents with  . Bariatric Follow Up   REFERRING PHYSICIAN: Karle Plumber, MD  HISTORY OF PRESENT ILLNESS: Brittany [name] is a 47 y.o. (DOB: December 15, 1965)  white female whose primary care physician is Karle Plumber, MD [facility]) and comes to me for follow up of a RYGB 07/05/2012.  She is doing well.  She said that at first it did not seem real.  She had some early trouble with solid food She is walking 1 1/2 miles daily.  We talked about even going further.  She said that now she is really starting to feel the energy increasing.  Bariatric History: Patient attended the information session the Dr. Ezzard Standing spoke at. She has tried multiple diets including, Weight Watchers, Medi spa twice (included Meridia and a Phen/phen type of med), a diet clinic in [place], and [employer] [facility] [employer].  She has had a the most success with Weight Watchers and the Y programs.   She said that her husband is very supportive and I told her that I wanted him to come to the pre op visit.   She has made checklist at home to decide the right operation for her.  She talked a little about her concerns about the surgery.  She talked about fear of failure of the operation in controlling her weight.  She has a cousin (who is younger) who had a sleeve and lives in the [place].  Past Medical History  Diagnosis Date  . Migraine   .  Hypertension   . Hypercholesterolemia   . Depression   . Morbid obesity   . Sleep apnea     no CPAP machine   . H/O hiatal hernia   . GERD (gastroesophageal reflux disease)     hx of   . Arthritis     hips, knees and hands       Current Outpatient Prescriptions  Medication Sig Dispense Refill  . calcium citrate-vitamin D (CITRACAL+D) 315-200 MG-UNIT per tablet Take 2 tablets by mouth daily. Patient takes in liquid form      . FIBER PO Take 1 each by mouth 2 (two) times daily.      . Multiple Vitamin (MULTIVITAMIN WITH MINERALS) TABS Take 1 tablet by mouth 2 (two) times daily.       . Potassium Gluconate 595 MG CAPS Take 1 each by mouth daily.      . rosuvastatin (CRESTOR) 20 MG tablet Take 10 mg by mouth daily before breakfast.       . Vilazodone HCl (VIIBRYD) 20 MG TABS Take 1 tablet by mouth every evening.       . vitamin B-12 (CYANOCOBALAMIN) 500 MCG tablet Take 500 mcg by mouth daily.      Marland Kitchen estradiol (VIVELLE-DOT) 0.1 MG/24HR Place 0.5 patches onto the  skin 2 (two) times a week. Patient cuts in half  Monday and Thursday       No current facility-administered medications for this visit.    Allergies  Allergen Reactions  . Amoxicillin Hives   REVIEW OF SYSTEMS: Neurologic:  History of migraine headaches abotu 2 per year.  Controlled with Imitrex. This all started about 4 years ago, but she is not sure why. Cardiac:  Hypertension x 2 years.  No history of seeing a cardiologist. Endocrine:  Hypercholesterolemia x 2 years.  No diabetes. No thyroid disease. Gastrointestinal:  No history of stomach disease.  No history of liver disease.  No history of gall bladder disease.  No history of pancreas disease.  No history of colon disease. Internal hemorrhoids.  History of colonoscopy in 2013.  Her mother had colon cancer.  Dr. Elbert Ewings. [facility], in [facility], is her treating GI doctor  GYN:  No pregnancies.  She had a right tube/ovary removed by Dr. Ashley Royalty in 1997.  Had  hysterectomy 2006 for benign disease. Dr. Nicholas Lose is her Gyn.  Also has had a right oophorectomy. Musculoskeletal:  She's had some back and knee discomfort, but she is not seeing an orthopedist on a regular basis. Psycho-social:  The patient is oriented.   Psych - 05/05/2012 - Saw Dr. Cyndia Skeeters  SOCIAL and FAMILY HISTORY: Married. No biologic children, but two step daughters who are like her own. She works for [employer], [place] [employer] - in home bankruptcy  PHYSICAL EXAM: BP 124/76  Pulse 66  Temp(Src) 99 F (37.2 C) (Temporal)  Resp 14  Ht 5\' 10"  (1.778 m)  Wt 241 lb 9.6 oz (109.589 kg)  BMI 34.67 kg/m2  General: WN obese WF who is alert and generally healthy appearing.  Lungs: Clear to auscultation and symmetric breath sounds. Heart:  RRR. No murmur or rub. Abdomen: Soft. No mass.  Normal bowel sounds.  Lower midline abdominal scar from prior GYN surgery.  She does have a small panus.  Incisions look good.  DATA REVIEWED: Epic notes  Ovidio Kin, MD,  [facility], [place] [address] [place]  [address]   [place], [place]    [address] Phone:  [phone] FAX:  [phone]

## 2012-12-31 ENCOUNTER — Encounter (INDEPENDENT_AMBULATORY_CARE_PROVIDER_SITE_OTHER): Payer: Self-pay

## 2013-01-04 ENCOUNTER — Encounter: Payer: Self-pay | Admitting: *Deleted

## 2013-01-04 ENCOUNTER — Encounter: Payer: 59 | Attending: Surgery | Admitting: *Deleted

## 2013-01-04 DIAGNOSIS — Z713 Dietary counseling and surveillance: Secondary | ICD-10-CM | POA: Insufficient documentation

## 2013-01-04 NOTE — Patient Instructions (Addendum)
Goals:  Follow Phase 3B: High Protein + Non-Starchy Vegetables  Increase lean protein foods to meet 60-80g goal  Increase fluid intake to 64oz +  Add 15 grams of carbohydrate (fruit or whole grains) with 1-2 meals or snacks  Always have protein with carbs to slow breakdown  Limit bananas and watermelon as they are 2 of the fasted digesting fruits  Aim for >30 min of physical activity daily  TANITA  BODY COMP RESULTS  07/20/12 08/30/12 10/06/12 01/04/13   BMI (kg/m^2) 40.2 37.8 35.8 32.1   Fat Mass (lbs) 163.0 145.5 127.5 109.5   Fat Free Mass (lbs) 117.0 118.0 122.0 114.5   Total Body Water (lbs) 85.5 86.5 89.5 84.0

## 2013-01-04 NOTE — Progress Notes (Signed)
Follow-up visit:  6 Months Post-Operative RYGB Surgery  Medical Nutrition Therapy:  Appt start time:  1215   End time:  1245.  Primary concerns today: Post-operative Bariatric Surgery Nutrition Management. Off HTN meds now d/t episodes of hypotension reported at previous visit. Following all other nutritional guidelines without issues.   Surgery date: 07/05/12  Surgery type: RYGB  Start weight at [facility]: 312.5 lbs (04/17/12) Pre-Op Class weight: 313.2 lbs (06/17/12)  Weight today: 224.0 lbs  Weight change: 25.5 lbs Total weight lost: 89.2 lbs  Goal weight: 185 lbs  % goal met: 70%  TANITA  BODY COMP RESULTS  07/20/12 08/30/12 10/06/12 01/04/13   BMI (kg/m^2) 40.2 37.8 35.8 32.1   Fat Mass (lbs) 163.0 145.5 127.5 109.5   Fat Free Mass (lbs) 117.0 118.0 122.0 114.5   Total Body Water (lbs) 85.5 86.5 89.5 84.0   Fluid intake:  55-70 oz Estimated total protein intake: 75-90g  Medications: See med list. Off HTN meds Supplementation: Taking regularly  Using straws: No Drinking while eating: No Hair loss:  Had been moderate, but now resolving Carbonated beverages:  No N/V/D/C:  Constipation mostly resolved; Takes Phillips Fiber gummies (2 daily). Sick off 1/4 cup of oat cereal  Dumping syndrome: None  Recent physical activity:  Walks 45-60 min (M, T, Th, F) around parking deck at work; Weds walks 60 min with walking group   Progress Towards Goal(s):  In progress.  Nutritional Diagnosis:  [place]-3.3 Overweight/obesity related to past poor dietary habits and physical inactivity as evidenced by patient w/ recent RYGB surgery following dietary guidelines for continued weight loss.  Intervention:  Nutrition education/diet advancement.  Monitoring/Evaluation:  Dietary intake, exercise, and body weight. Follow up in 3 months for 9 month post-op visit.

## 2013-01-06 ENCOUNTER — Ambulatory Visit: Payer: 59 | Admitting: *Deleted

## 2013-02-02 ENCOUNTER — Encounter (INDEPENDENT_AMBULATORY_CARE_PROVIDER_SITE_OTHER): Payer: Self-pay | Admitting: Surgery

## 2013-02-02 ENCOUNTER — Ambulatory Visit (INDEPENDENT_AMBULATORY_CARE_PROVIDER_SITE_OTHER): Payer: 59 | Admitting: Surgery

## 2013-02-02 VITALS — BP 118/76 | HR 60 | Temp 98.3°F | Resp 14 | Ht 70.0 in | Wt 222.4 lb

## 2013-02-02 DIAGNOSIS — Z9884 Bariatric surgery status: Secondary | ICD-10-CM

## 2013-02-02 DIAGNOSIS — Z6841 Body Mass Index (BMI) 40.0 and over, adult: Secondary | ICD-10-CM

## 2013-02-02 NOTE — Progress Notes (Signed)
Re:   Brittany [name] DOB:   02/09/1966 MRN:   [id number]  ASSESSMENT AND PLAN: 1.  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  She has done well.  She is down 80+ pounds.  To see in 3 months.  2.  History of migraines  About 2 per year. 3.  Hypertension - off meds 4.  Hypercholesterolemia - taking 1/2 Crestor 5.  Internal hemorrhoids. 6.  Family history of colon cancer - her mother had colon cancer.  Chief Complaint  Patient presents with  . Bariatric Follow Up    gastric bypass   REFERRING PHYSICIAN: Karle Plumber, MD  HISTORY OF PRESENT ILLNESS: Brittany [name] is a 47 y.o. (DOB: 1966/03/23)  white female whose primary care physician is Brittany Plumber, MD [facility], [facility]) and comes to me for follow up of a RYGB 07/05/2012.  She is doing well.  She is down 80+ pounds.  She is walking about 40 minutes per day.  She is having no trouble with her food.  She has some chronic constipation for which she takes fiber. We talked about a goal weight of BMI 30 (that's about 205 pounds),  Brittany [name] had talked about a goal weight of 185. Brittany [name] did her labs and she said they were okay.  We do not have the results at this time.  Bariatric History: Patient attended the information session the Brittany [name] spoke at. She has tried multiple diets including, Weight Watchers, Medi spa twice (included Meridia and a Phen/phen type of med), a diet clinic in [place], and [employer] [facility] [employer].  She has had a the most success with Weight Watchers and the Y programs.   She said that her husband is very supportive and I told her that I wanted him to come to the pre op visit.   She has made checklist at home to decide the right operation for her.  She talked a little about her concerns about the surgery.  She talked about fear of failure of the operation in controlling her weight.  She has a cousin (who is younger)  who had a sleeve and lives in the [place].  Past Medical History  Diagnosis Date  . Migraine   . Hypertension   . Hypercholesterolemia   . Depression   . Morbid obesity   . Sleep apnea     no CPAP machine   . [name]/O hiatal hernia   . GERD (gastroesophageal reflux disease)     hx of   . Arthritis     hips, knees and hands       Current Outpatient Prescriptions  Medication Sig Dispense Refill  . calcium citrate-vitamin D (CITRACAL+D) 315-200 MG-UNIT per tablet Take 2 tablets by mouth daily. Patient takes in liquid form      . estradiol (VIVELLE-DOT) 0.1 MG/24HR Place 0.5 patches onto the skin 2 (two) times a week. Patient cuts in half  Monday and Thursday      . FIBER PO Take 1 each by mouth 2 (two) times daily.      . Multiple Vitamin (MULTIVITAMIN WITH MINERALS) TABS Take 1 tablet by mouth 2 (two) times daily.       . Potassium Gluconate 595 MG CAPS Take 1 each by mouth daily.      . rosuvastatin (CRESTOR) 20 MG tablet Take 10 mg by mouth daily before breakfast.       .  Vilazodone HCl (VIIBRYD) 20 MG TABS Take 1 tablet by mouth every evening.       . vitamin B-12 (CYANOCOBALAMIN) 500 MCG tablet Take 500 mcg by mouth daily.       No current facility-administered medications for this visit.    Allergies  Allergen Reactions  . Amoxicillin Hives   REVIEW OF SYSTEMS: Neurologic:  History of migraine headaches abotu 2 per year.  Controlled with Imitrex. This all started about 4 years ago, but she is not sure why. Cardiac:  Hypertension x 2 years.  No history of seeing a cardiologist. Endocrine:  Hypercholesterolemia x 2 years.  No diabetes. No thyroid disease. Gastrointestinal:  No history of stomach disease.  No history of liver disease.  No history of gall bladder disease.  No history of pancreas disease.  No history of colon disease. Internal hemorrhoids.  History of colonoscopy in 2013.  Her mother had colon cancer.  Brittany [name]. [name], in [facility], is her treating  GI doctor  GYN:  No pregnancies.  She had a right tube/ovary removed by Brittany [name] in 1997.  Had hysterectomy 2006 for benign disease. Brittany [name] is her Gyn.  Also has had a right oophorectomy. Musculoskeletal:  She's had some back and knee discomfort, but she is not seeing an orthopedist on a regular basis. Psycho-social:  The patient is oriented.   Psych - 05/05/2012 - Saw Brittany [name]  SOCIAL and FAMILY HISTORY: Married. No biologic children, but two step daughters who are like her own. She works for [employer], [place] [employer] - in home bankruptcy  PHYSICAL EXAM: BP 118/76  Pulse 60  Temp(Src) 98.3 F (36.8 C) (Temporal)  Resp 14  Ht 5\' 10"  (1.778 m)  Wt 222 lb 6.4 oz (100.88 kg)  BMI 31.91 kg/m2  General: WN obese WF who is alert and healthy appearing.  Lungs: Clear to auscultation and symmetric breath sounds. Heart:  RRR. No murmur or rub. Abdomen: Soft. No mass.  Normal bowel sounds.  Lower midline abdominal scar from prior GYN surgery.  She does have a small panus.  Incisions look good.  No hernia.  She has some excess skin. Extremities:  Good strength in upper and lower extremities.  DATA REVIEWED: Epic notes  Brittany Kin, MD,  [facility], [place] [address] [place]  [address]   [place], [place]    [address] Phone:  [phone] FAX:  [phone]

## 2013-02-04 ENCOUNTER — Encounter (INDEPENDENT_AMBULATORY_CARE_PROVIDER_SITE_OTHER): Payer: Self-pay

## 2013-02-17 ENCOUNTER — Other Ambulatory Visit: Payer: Self-pay

## 2013-03-28 ENCOUNTER — Ambulatory Visit: Payer: 59 | Admitting: *Deleted

## 2013-04-21 ENCOUNTER — Ambulatory Visit (INDEPENDENT_AMBULATORY_CARE_PROVIDER_SITE_OTHER): Payer: 59 | Admitting: Surgery

## 2013-05-02 ENCOUNTER — Encounter: Payer: 59 | Attending: Surgery | Admitting: Dietician

## 2013-05-02 DIAGNOSIS — F329 Major depressive disorder, single episode, unspecified: Secondary | ICD-10-CM | POA: Insufficient documentation

## 2013-05-02 DIAGNOSIS — Z9884 Bariatric surgery status: Secondary | ICD-10-CM | POA: Insufficient documentation

## 2013-05-02 DIAGNOSIS — F3289 Other specified depressive episodes: Secondary | ICD-10-CM | POA: Insufficient documentation

## 2013-05-02 DIAGNOSIS — E669 Obesity, unspecified: Secondary | ICD-10-CM | POA: Insufficient documentation

## 2013-05-02 DIAGNOSIS — Z6830 Body mass index (BMI) 30.0-30.9, adult: Secondary | ICD-10-CM | POA: Insufficient documentation

## 2013-05-02 DIAGNOSIS — Z713 Dietary counseling and surveillance: Secondary | ICD-10-CM | POA: Insufficient documentation

## 2013-05-02 NOTE — Patient Instructions (Addendum)
Goals:  Follow Phase 3B: High Protein + Non-Starchy Vegetables  Increase lean protein foods to meet 60-80g goal  Increase fluid intake to 64oz +  Add 15 grams of carbohydrate (fruit or whole grains) with 1-2 meals or snacks  Always have protein with carbs to slow breakdown  Limit bananas and watermelon as they are 2 of the fasted digesting fruits  Continue for >30 min of physical activity daily   TANITA  BODY COMP RESULTS  07/20/12 08/30/12 10/06/12 01/04/13 05/02/13   BMI (kg/m^2) 40.2 37.8 35.8 32.1 30.3   Fat Mass (lbs) 163.0 145.5 127.5 109.5 100.5   Fat Free Mass (lbs) 117.0 118.0 122.0 114.5 111.0   Total Body Water (lbs) 85.5 86.5 89.5 84.0 81.5

## 2013-05-02 NOTE — Progress Notes (Signed)
Follow-up visit:  9 Months Post-Operative RYGB Surgery  Medical Nutrition Therapy:  Appt start time:  1000   End time:  1030 Primary concerns today:  Post-operative Bariatric Surgery Nutrition Management. Brittany [name] is here today with a 12.5 pound weight loss. She is happy to have reached the "triple digit weight loss" mark. However, she reports recent feelings of depression and has recently begun taking Cymbalta.She is having difficulty meeting protein and fluid needs. Discussed strategies to incorporate more fluids and protein into meals. Using snack baggies to control portions. Still off HTN meds. Following all other nutritional guidelines without issues.   Surgery date: 07/05/12  Surgery type: RYGB  Start weight at [facility]: 312.5 lbs (04/17/12) Pre-Op Class weight: 313.2 lbs (06/17/12)  Weight today: 211.5 lb  Weight change: 12.5 lbs Total weight lost: 101 lbs  Goal weight: 185 lbs  % goal met: 79%  TANITA  BODY COMP RESULTS  07/20/12 08/30/12 10/06/12 01/04/13 05/02/13   BMI (kg/m^2) 40.2 37.8 35.8 32.1 30.3   Fat Mass (lbs) 163.0 145.5 127.5 109.5 100.5   Fat Free Mass (lbs) 117.0 118.0 122.0 114.5 111.0   Total Body Water (lbs) 85.5 86.5 89.5 84.0 81.5   Fluid intake:  <64 oz (Drinking decaf coffee and tea, not as much plain water. No sodas or caffeinated beverages.) Estimated total protein intake: 50-60g  B: steel cut oats or greek light and fit yogurt with wheat and bran flakes S: almonds or peanut butter L: yogurt or venison stew S: peanut butter with celery or LF cottage cheese D: steak or sashimi or chicken (3 oz meat) and almonds   Medications: See med list. Off HTN meds Supplementation: Taking regularly  Using straws: No Drinking while eating: No Hair loss:  Resolved Carbonated beverages:  No N/V/D/C:  Constipation resolved with fiber gummies Dumping syndrome: None  Recent physical activity:  Walks 45-50 min (almost every work day) around parking deck at work and around town    Progress Towards Goal(s):  In progress.  Nutritional Diagnosis:  [place]-3.3 Overweight/obesity related to past poor dietary habits and physical inactivity as evidenced by patient w/ recent RYGB surgery following dietary guidelines for continued weight loss.  Intervention:  Nutrition education/diet encouragement  Monitoring/Evaluation:  Dietary intake, exercise, and body weight. Follow up in 3 months for 12 month post-op visit.

## 2013-05-27 ENCOUNTER — Ambulatory Visit (INDEPENDENT_AMBULATORY_CARE_PROVIDER_SITE_OTHER): Payer: 59 | Admitting: Surgery

## 2013-05-27 ENCOUNTER — Encounter (INDEPENDENT_AMBULATORY_CARE_PROVIDER_SITE_OTHER): Payer: Self-pay | Admitting: Surgery

## 2013-05-27 DIAGNOSIS — Z9884 Bariatric surgery status: Secondary | ICD-10-CM

## 2013-05-27 NOTE — Progress Notes (Signed)
Re:   Brittany [name] DOB:   10-09-1965 MRN:   [id number]  ASSESSMENT AND PLAN: 1.  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  She has done well.  She is down 100+ pounds.  She is now only "overweight".  She is at her 1 year mark.  I'll see her back in one year.  2.  History of migraines - improved.  She had her first migraine about 3 weeks ago.  And it resolved with minimal meds. 3.   - off meds 4.  Hypercholesterolemia - taking 1/2 Crestor 5.  Internal hemorrhoids. 6.  Family history of colon cancer - her mother had colon cancer.  Chief Complaint  Patient presents with  . Bariatric Follow Up    RNY 06-09-12   REFERRING PHYSICIAN: Guadlupe Spanish, MD  HISTORY OF PRESENT ILLNESS: Brittany [name] is a 48 y.o. (DOB: July 25, 1965)  white female whose primary care physician is Guadlupe Spanish, MD [facility]) and comes to me for follow up of a RYGB 07/05/2012.  She is doing well.  The thing she has enjoyed the most in her weight loss surgery is her increased mobility. She is walking about 40 minutes per day. She actually walks from her business at [employer] to our office. Dr. Holley Raring did her labs and she said they were okay.  We do not have the results at this time.  Bariatric History: Patient attended the information session the Dr. Lucia Gaskins spoke at. She has tried multiple diets including, Weight Watchers, [facility] twice (included Meridia and a Phen/phen type of med), a diet clinic in [place], and [employer] [facility] [employer].  She has had a the most success with Weight Watchers and the Y programs.   She said that her husband is very supportive and I told her that I wanted him to come to the pre op visit.   She has made checklist at home to decide the right operation for her.  She talked a little about her concerns about the surgery.  She talked about fear of failure of the operation in  controlling her weight.  She has a cousin (who is younger) who had a sleeve and lives in the [place].  Past Medical History  Diagnosis Date  . Migraine   . Hypertension   . Hypercholesterolemia   . Depression   . Morbid obesity   . Sleep apnea     no CPAP machine   . H/O hiatal hernia   . GERD (gastroesophageal reflux disease)     hx of   . Arthritis     hips, knees and hands       Current Outpatient Prescriptions  Medication Sig Dispense Refill  . calcium citrate-vitamin D (CITRACAL+D) 315-200 MG-UNIT per tablet Take 2 tablets by mouth daily. Patient takes in liquid form      . clonazePAM (KLONOPIN) 0.5 MG tablet Take by mouth 2 (two) times daily as needed for anxiety.      . DULoxetine (CYMBALTA) 20 MG capsule Take 20 mg by mouth daily.      Marland Kitchen estradiol (VIVELLE-DOT) 0.1 MG/24HR Place 0.5 patches onto the skin 2 (two) times a week. Patient cuts in half  Monday and Thursday      . FIBER PO Take 1 each by mouth 2 (two) times daily.      . Multiple Vitamin (MULTIVITAMIN WITH MINERALS) TABS  Take 1 tablet by mouth 2 (two) times daily.       . rosuvastatin (CRESTOR) 20 MG tablet Take 10 mg by mouth daily before breakfast.       . Vilazodone HCl (VIIBRYD) 20 MG TABS Take 10 mg by mouth every evening.       . vitamin B-12 (CYANOCOBALAMIN) 500 MCG tablet Take 500 mcg by mouth daily.      . Potassium Gluconate 595 MG CAPS Take 1 each by mouth daily.       No current facility-administered medications for this visit.    Allergies  Allergen Reactions  . Amoxicillin Hives   REVIEW OF SYSTEMS: Neurologic:  History of migraine headaches abotu 2 per year.  Controlled with Imitrex. This all started about 4 years ago, but she is not sure why. Cardiac:  Hypertension x 2 years.  No history of seeing a cardiologist. Endocrine:  Hypercholesterolemia x 2 years.  No diabetes. No thyroid disease. Gastrointestinal:  No history of stomach disease.  No history of liver disease.  No history of gall  bladder disease.  No history of pancreas disease.  No history of colon disease. Internal hemorrhoids.  History of colonoscopy in 2013.  Her mother had colon cancer.  Dr. Carlean Jews. [facility], in [facility], is her treating GI doctor  GYN:  No pregnancies.  She had a right tube/ovary removed by Dr. Zigmund Daniel in 1997.  Had hysterectomy 2006 for benign disease. Dr. Ubaldo Glassing is her Gyn.  Also has had a right oophorectomy. Musculoskeletal:  She's had some back and knee discomfort, but she is not seeing an orthopedist on a regular basis. Psycho-social:  Psych - 05/05/2012 - Saw Dr. Ardath Sax  SOCIAL and FAMILY HISTORY: Married. No biologic children, but two step daughters who are like her own. She works for [employer], [place] - in home bankruptcy.  She walks from there to our office!  PHYSICAL EXAM: BP 124/76  Pulse 72  Temp(Src) 98 F (36.7 C)  Resp 18  Ht 5\' 10"  (1.778 m)  Wt 208 lb 12.8 oz (94.711 kg)  BMI 29.96 kg/m2  General: WN obese WF who is alert and healthy appearing.  Lungs: Clear to auscultation and symmetric breath sounds. Heart:  RRR. No murmur or rub. Abdomen: Soft. No mass.  Normal bowel sounds.  Lower midline abdominal scar from prior GYN surgery.  She does have a small panus.  Incisions look good.  No hernia.  She has some excess skin. Extremities:  Good strength in upper and lower extremities.  DATA REVIEWED: Epic notes  Alphonsa Overall, MD,  [facility], [address]  [place], [place]    [place] Phone:  [phone] FAX:  [phone]

## 2013-06-06 ENCOUNTER — Encounter (INDEPENDENT_AMBULATORY_CARE_PROVIDER_SITE_OTHER): Payer: Self-pay

## 2013-08-01 ENCOUNTER — Ambulatory Visit: Payer: 59 | Admitting: Dietician

## 2013-08-10 ENCOUNTER — Ambulatory Visit: Payer: 59 | Admitting: Dietician

## 2013-11-22 ENCOUNTER — Inpatient Hospital Stay (HOSPITAL_COMMUNITY)
Admission: AD | Admit: 2013-11-22 | Discharge: 2013-11-30 | DRG: 885 | Disposition: A | Discharge status: Home / Self Care | Payer: 59 | Source: Intra-hospital | Attending: Psychiatry | Admitting: Psychiatry

## 2013-11-22 ENCOUNTER — Emergency Department (HOSPITAL_COMMUNITY)
Admission: EM | Admit: 2013-11-22 | Discharge: 2013-11-22 | Disposition: A | Discharge status: Other Acute Inpatient Hospital | Payer: 59 | Attending: Emergency Medicine | Admitting: Emergency Medicine

## 2013-11-22 ENCOUNTER — Encounter (HOSPITAL_COMMUNITY): Payer: Self-pay | Admitting: *Deleted

## 2013-11-22 DIAGNOSIS — R45851 Suicidal ideations: Secondary | ICD-10-CM

## 2013-11-22 DIAGNOSIS — M1710 Unilateral primary osteoarthritis, unspecified knee: Secondary | ICD-10-CM | POA: Diagnosis not present

## 2013-11-22 DIAGNOSIS — I10 Essential (primary) hypertension: Secondary | ICD-10-CM | POA: Diagnosis present

## 2013-11-22 DIAGNOSIS — E7800 Pure hypercholesterolemia, unspecified: Secondary | ICD-10-CM | POA: Diagnosis present

## 2013-11-22 DIAGNOSIS — F39 Unspecified mood [affective] disorder: Secondary | ICD-10-CM | POA: Diagnosis present

## 2013-11-22 DIAGNOSIS — Z880 Allergy status to penicillin: Secondary | ICD-10-CM | POA: Diagnosis not present

## 2013-11-22 DIAGNOSIS — G4710 Hypersomnia, unspecified: Secondary | ICD-10-CM | POA: Diagnosis present

## 2013-11-22 DIAGNOSIS — F411 Generalized anxiety disorder: Secondary | ICD-10-CM | POA: Diagnosis present

## 2013-11-22 DIAGNOSIS — Z8719 Personal history of other diseases of the digestive system: Secondary | ICD-10-CM | POA: Diagnosis not present

## 2013-11-22 DIAGNOSIS — F331 Major depressive disorder, recurrent, moderate: Secondary | ICD-10-CM

## 2013-11-22 DIAGNOSIS — Z87891 Personal history of nicotine dependence: Secondary | ICD-10-CM

## 2013-11-22 DIAGNOSIS — G4730 Sleep apnea, unspecified: Secondary | ICD-10-CM | POA: Diagnosis present

## 2013-11-22 DIAGNOSIS — M129 Arthropathy, unspecified: Secondary | ICD-10-CM | POA: Diagnosis present

## 2013-11-22 DIAGNOSIS — Z9884 Bariatric surgery status: Secondary | ICD-10-CM | POA: Insufficient documentation

## 2013-11-22 DIAGNOSIS — Z79899 Other long term (current) drug therapy: Secondary | ICD-10-CM | POA: Insufficient documentation

## 2013-11-22 DIAGNOSIS — F332 Major depressive disorder, recurrent severe without psychotic features: Secondary | ICD-10-CM | POA: Diagnosis present

## 2013-11-22 DIAGNOSIS — F32A Depression, unspecified: Secondary | ICD-10-CM

## 2013-11-22 DIAGNOSIS — F3289 Other specified depressive episodes: Secondary | ICD-10-CM | POA: Insufficient documentation

## 2013-11-22 DIAGNOSIS — F329 Major depressive disorder, single episode, unspecified: Secondary | ICD-10-CM

## 2013-11-22 DIAGNOSIS — IMO0002 Reserved for concepts with insufficient information to code with codable children: Secondary | ICD-10-CM

## 2013-11-22 DIAGNOSIS — K219 Gastro-esophageal reflux disease without esophagitis: Secondary | ICD-10-CM | POA: Diagnosis present

## 2013-11-22 DIAGNOSIS — G4700 Insomnia, unspecified: Secondary | ICD-10-CM | POA: Diagnosis present

## 2013-11-22 DIAGNOSIS — F4310 Post-traumatic stress disorder, unspecified: Secondary | ICD-10-CM | POA: Diagnosis present

## 2013-11-22 LAB — RAPID URINE DRUG SCREEN, HOSP PERFORMED
Amphetamines: NOT DETECTED
BENZODIAZEPINES: NOT DETECTED
Barbiturates: NOT DETECTED
Cocaine: NOT DETECTED
OPIATES: NOT DETECTED
TETRAHYDROCANNABINOL: NOT DETECTED

## 2013-11-22 LAB — CBC
HCT: 38.1 % (ref 36.0–46.0)
HEMOGLOBIN: 13.1 g/dL (ref 12.0–15.0)
MCH: 33 pg (ref 26.0–34.0)
MCHC: 34.4 g/dL (ref 30.0–36.0)
MCV: 96 fL (ref 78.0–100.0)
Platelets: 300 10*3/uL (ref 150–400)
RBC: 3.97 MIL/uL (ref 3.87–5.11)
RDW: 12.3 % (ref 11.5–15.5)
WBC: 6.1 10*3/uL (ref 4.0–10.5)

## 2013-11-22 LAB — ETHANOL: Alcohol, Ethyl (B): <11 mg/dL (ref 0–11)

## 2013-11-22 LAB — COMPREHENSIVE METABOLIC PANEL
ALBUMIN: 4.1 g/dL (ref 3.5–5.2)
ALK PHOS: 71 U/L (ref 39–117)
ALT: 30 U/L (ref 0–35)
AST: 16 U/L (ref 0–37)
Anion gap: 15 (ref 5–15)
BUN: 16 mg/dL (ref 6–23)
CO2: 24 mEq/L (ref 19–32)
Calcium: 9.5 mg/dL (ref 8.4–10.5)
Chloride: 103 mEq/L (ref 96–112)
Creatinine, Ser: 0.5 mg/dL (ref 0.50–1.10)
GFR calc Af Amer: >90 mL/min (ref >90)
GFR calc non Af Amer: >90 mL/min (ref >90)
GLUCOSE: 92 mg/dL (ref 70–99)
POTASSIUM: 3.6 meq/L — AB (ref 3.7–5.3)
Sodium: 142 mEq/L (ref 137–147)
TOTAL PROTEIN: 7.5 g/dL (ref 6.0–8.3)
Total Bilirubin: 0.4 mg/dL (ref 0.3–1.2)

## 2013-11-22 LAB — SALICYLATE LEVEL: SALICYLATE LVL: 3.1 mg/dL (ref 2.8–20.0)

## 2013-11-22 LAB — ACETAMINOPHEN LEVEL

## 2013-11-22 MED ORDER — ASPIRIN-ACETAMINOPHEN-CAFFEINE 250-250-65 MG PO TABS: 2.0000 | ORAL_TABLET | Freq: Four times a day (QID) | ORAL | Status: DC | PRN

## 2013-11-22 MED ORDER — ARIPIPRAZOLE 2 MG PO TABS
2.0000 mg | ORAL_TABLET | Freq: Every day | ORAL | Status: DC
  Administered 2013-11-22: 2 mg via ORAL
  Filled 2013-11-22 (×2): qty 1

## 2013-11-22 MED ORDER — VITAMIN B-12 1000 MCG PO TABS: 500.0000 ug | ORAL_TABLET | Freq: Every day | ORAL | Status: DC

## 2013-11-22 MED ORDER — ACETAMINOPHEN 325 MG PO TABS
650.0000 mg | ORAL_TABLET | Freq: Four times a day (QID) | ORAL | Status: DC | PRN
  Administered 2013-11-24 – 2013-11-27 (×4): 650 mg via ORAL
  Filled 2013-11-22 (×4): qty 2

## 2013-11-22 MED ORDER — CYANOCOBALAMIN 500 MCG PO TABS
500.0000 ug | ORAL_TABLET | Freq: Every day | ORAL | Status: DC
  Administered 2013-11-23 – 2013-11-30 (×8): 500 ug via ORAL
  Filled 2013-11-22 (×11): qty 1

## 2013-11-22 MED ORDER — ADULT MULTIVITAMIN W/MINERALS CH: 1.0000 | ORAL_TABLET | Freq: Every day | ORAL | Status: DC

## 2013-11-22 MED ORDER — NICOTINE 21 MG/24HR TD PT24: 21.0000 mg | MEDICATED_PATCH | Freq: Every day | TRANSDERMAL | Status: DC

## 2013-11-22 MED ORDER — ADULT MULTIVITAMIN W/MINERALS CH
1.0000 | ORAL_TABLET | Freq: Every day | ORAL | Status: DC
  Administered 2013-11-23 – 2013-11-30 (×8): 1 via ORAL
  Filled 2013-11-22 (×11): qty 1

## 2013-11-22 MED ORDER — CALCIUM-PHOSPHORUS-VITAMIN D 250-107-500 MG-MG-UNIT PO CHEW: 4.0000 | CHEWABLE_TABLET | Freq: Every day | ORAL | Status: DC

## 2013-11-22 MED ORDER — IBUPROFEN 200 MG PO TABS: 600.0000 mg | ORAL_TABLET | Freq: Three times a day (TID) | ORAL | Status: DC | PRN

## 2013-11-22 MED ORDER — ONDANSETRON HCL 4 MG PO TABS
4.0000 mg | ORAL_TABLET | Freq: Three times a day (TID) | ORAL | Status: DC | PRN
Start: 2013-11-22 — End: 2013-11-22

## 2013-11-22 MED ORDER — ZOLPIDEM TARTRATE 5 MG PO TABS: 5.0000 mg | ORAL_TABLET | Freq: Every evening | ORAL | Status: DC | PRN

## 2013-11-22 MED ORDER — LORAZEPAM 1 MG PO TABS: 1.0000 mg | ORAL_TABLET | Freq: Three times a day (TID) | ORAL | Status: DC | PRN

## 2013-11-22 MED ORDER — ESTRADIOL 0.1 MG/24HR TD PTWK
0.1000 mg | MEDICATED_PATCH | TRANSDERMAL | Status: DC
  Administered 2013-11-24: 0.1 mg via TRANSDERMAL
  Filled 2013-11-22 (×2): qty 1

## 2013-11-22 MED ORDER — ARIPIPRAZOLE 2 MG PO TABS
2.0000 mg | ORAL_TABLET | Freq: Every day | ORAL | Status: DC
  Administered 2013-11-23: 2 mg via ORAL
  Filled 2013-11-22 (×4): qty 1

## 2013-11-22 MED ORDER — MAGNESIUM HYDROXIDE 400 MG/5ML PO SUSP: 30.0000 mL | Freq: Every day | ORAL | Status: DC | PRN

## 2013-11-22 MED ORDER — ALUM & MAG HYDROXIDE-SIMETH 200-200-20 MG/5ML PO SUSP: 30.0000 mL | ORAL | Status: DC | PRN

## 2013-11-22 MED ORDER — DULOXETINE HCL 60 MG PO CPEP
60.0000 mg | ORAL_CAPSULE | Freq: Every day | ORAL | Status: DC
  Administered 2013-11-23 – 2013-11-26 (×4): 60 mg via ORAL
  Filled 2013-11-22 (×7): qty 1

## 2013-11-22 MED ORDER — ASPIRIN-ACETAMINOPHEN-CAFFEINE 250-250-65 MG PO TABS
3.0000 | ORAL_TABLET | Freq: Four times a day (QID) | ORAL | Status: DC | PRN
  Filled 2013-11-22: qty 3

## 2013-11-22 MED ORDER — ESTRADIOL 0.1 MG/24HR TD PTWK
0.1000 mg | MEDICATED_PATCH | TRANSDERMAL | Status: DC
Start: 2013-11-29 — End: 2013-11-22

## 2013-11-22 MED ORDER — CO Q 10 10 MG PO CAPS: 10.0000 mg | ORAL_CAPSULE | Freq: Every morning | ORAL | Status: DC

## 2013-11-22 MED ORDER — DULOXETINE HCL 60 MG PO CPEP
60.0000 mg | ORAL_CAPSULE | Freq: Every day | ORAL | Status: DC
  Filled 2013-11-22 (×2): qty 1

## 2013-11-22 NOTE — Tx Team (Signed)
Initial Interdisciplinary Treatment Plan   PATIENT STRESSORS: Marital or family conflict Medication change or noncompliance   PROBLEM LIST: Problem List/Patient Goals Date to be addressed Date deferred Reason deferred Estimated date of resolution  Suicidal Ideation 11/22/2013     Depression 11/22/2013                                                DISCHARGE CRITERIA:  Ability to meet basic life and health needs Motivation to continue treatment in a less acute level of care Need for constant or close observation no longer present  PRELIMINARY DISCHARGE PLAN: Return to previous living arrangement Return to previous work or school arrangements  PATIENT/FAMIILY INVOLVEMENT: This treatment plan has been presented to and reviewed with the patient, Brittany [name].  [name] 11/22/2013, 7:28 PM

## 2013-11-22 NOTE — ED Notes (Signed)
Pt c/o depression, racing thoughts, suicidal ideation. Pt states that her life is currently running smoothly, there is no external reason for her current mental state. Pt states that she has struggled with depression for years, and that she has recently been getting worse. Pt states that recently she has has suicidal thoughts involving cutting her wrists. Pt denies any recent suicidal attempts. Pt c/o migraine as well. Pt states that her doctors have been adjusting her medications.
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
## 2013-11-22 NOTE — Progress Notes (Signed)
[place] Group Notes:  (Nursing/MHT/Case Management/Adjunct)  Date:  11/22/2013  Time:  9:59 PM  Type of Therapy:  Group Therapy  Participation Level:  Minimal  Participation Quality:  Attentive  Affect:  Appropriate  Cognitive:  Alert  Insight:  Good  Engagement in Group:  Engaged  Modes of Intervention:  Discussion  Summary of Progress/Problems: Today is the patients first day here and she says that its been a bad day. Patient says that she lots of questions but feels that they will get answered as time goes on. Patient stated that she is still having suicidal thoughts but she contracts for safety and will talk to the staff if anything changes. The thing she does to take care of herself is to do acupuncture.   [facility] 11/22/2013, 9:59 PM

## 2013-11-22 NOTE — Progress Notes (Signed)
PHARMACIST - PHYSICIAN ORDER COMMUNICATION  CONCERNING: P&T Medication Policy on Herbal Medications  DESCRIPTION:  This patient's order for:  Coenzyme-Q 10 & Calcium-Phosphourous-Vit D  has been noted.  This product(s) is classified as an "herbal" or natural product. Due to a lack of definitive safety studies or FDA approval, nonstandard manufacturing practices, plus the potential risk of unknown drug-drug interactions while on inpatient medications, the Pharmacy and Therapeutics Committee does not permit the use of "herbal" or natural products of this type within [facility].   ACTION TAKEN: The pharmacy department is unable to verify this order at this time and your patient has been informed of this safety policy. Please reevaluate patient's clinical condition at discharge and address if the herbal or natural product(s) should be resumed at that time.   Kizzie Furnish, PharmD Pager: [phone] 11/22/2013 2:19 PM

## 2013-11-22 NOTE — BH Assessment (Signed)
Patient accepted to a 500 hall @ [facility] by Dr. Lovena Le. The bed assignment is not ready at this time. Per AC-Tina bed will be ready sometime today. Patient to remain in the ED until the bed becomes available.

## 2013-11-22 NOTE — ED Notes (Signed)
Patient's family has her belongings.

## 2013-11-22 NOTE — ED Notes (Addendum)
Informed patient of admission to inpatient and what to expect.  Family at bedside.  AC informed this Probation officer to transfer @1700 .
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
## 2013-11-23 ENCOUNTER — Encounter (HOSPITAL_COMMUNITY): Payer: Self-pay | Admitting: Psychiatry

## 2013-11-23 DIAGNOSIS — F332 Major depressive disorder, recurrent severe without psychotic features: Principal | ICD-10-CM

## 2013-11-23 DIAGNOSIS — F39 Unspecified mood [affective] disorder: Secondary | ICD-10-CM | POA: Diagnosis present

## 2013-11-23 DIAGNOSIS — F4310 Post-traumatic stress disorder, unspecified: Secondary | ICD-10-CM | POA: Diagnosis present

## 2013-11-23 MED ORDER — LAMOTRIGINE 25 MG PO TABS
25.0000 mg | ORAL_TABLET | Freq: Every day | ORAL | Status: DC
  Administered 2013-11-23 – 2013-11-28 (×6): 25 mg via ORAL
  Filled 2013-11-23 (×8): qty 1

## 2013-11-23 MED ORDER — ZOLPIDEM TARTRATE 10 MG PO TABS
10.0000 mg | ORAL_TABLET | Freq: Every evening | ORAL | Status: DC | PRN
  Administered 2013-11-23 – 2013-11-27 (×5): 10 mg via ORAL
  Filled 2013-11-23 (×5): qty 1

## 2013-11-23 MED ORDER — JUVEN PO PACK
1.0000 | PACK | Freq: Two times a day (BID) | ORAL | Status: DC
  Administered 2013-11-23 – 2013-11-30 (×10): 1 via ORAL
  Filled 2013-11-23 (×18): qty 1

## 2013-11-23 MED ORDER — ARIPIPRAZOLE 5 MG PO TABS
5.0000 mg | ORAL_TABLET | Freq: Every day | ORAL | Status: DC
  Administered 2013-11-24 – 2013-11-25 (×2): 5 mg via ORAL
  Filled 2013-11-23 (×4): qty 1
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
## 2013-11-23 NOTE — Progress Notes (Signed)
Adult Psychoeducational Group Note  Date:  11/23/2013 Time:  9:47 PM  Group Topic/Focus:  Wrap-Up Group:   The focus of this group is to help patients review their daily goal of treatment and discuss progress on daily workbooks.  Participation Level:  Active  Participation Quality:  Appropriate  Affect:  Appropriate  Cognitive:  Appropriate  Insight: Appropriate  Engagement in Group:  Engaged  Modes of Intervention:  Education  Additional Comments:  Patient rated today a 5 out of 10 stating today was much better than yesterday. Patient stated one positive thing about today was that she made progress and was beginning to feel more comfortable here.  Brittany [name] 11/23/2013, 9:47 PM

## 2013-11-23 NOTE — Progress Notes (Signed)
D: Patient has had minimal interaction with staff tonight.  Patient states she had an ok day.  Patient states she is passive SI but verbally contracts for safety.  Patient states she has tried to free up her mind by reading. A: Staff to monitor Q 15 mins for safety.  Encouragement and support offered.  Scheduled medications administered per orders. R: Patient remains safe on the unit.  Patient attended group tonight.  Patient visible on unit tonight.  Patint taking administered medications

## 2013-11-23 NOTE — ED Provider Notes (Signed)
Medical screening examination/treatment/procedure(s) were performed by non-physician practitioner and as supervising physician I was immediately available for consultation/collaboration.  Leota Jacobsen, MD 11/23/13 [phone]

## 2013-11-23 NOTE — Progress Notes (Signed)
D:  Pt passive SI-but contracts for safety. Pt +ve AH- some command and some doubting.  Pt denies HI/VH. Pt is pleasant and cooperative. Pt is afraid, because the voices are new to her (few months old). 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 Pt appears to be frightened.   A: Pt was offered support and encouragement. Pt was given scheduled medications. Pt was encourage to attend groups. Q 15 minute checks were done for safety.   R:Pt attends groups and interacts well with peers and staff. Pt is taking medication. Pt has no complaints at this time .Pt receptive to treatment and safety maintained on unit.

## 2013-11-23 NOTE — Progress Notes (Signed)
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 She reports a good appetite and typically eats small meals and snacks throughout the day, but has questions about snack and meal options. She is not able to eat high carb/high sugar snacks due to dumping syndrome.   Wt Readings from Last 15 Encounters:  11/22/13 205 lb (92.987 kg)  05/27/13 208 lb 12.8 oz (94.711 kg)  05/02/13 211 lb 8 oz (95.936 kg)  02/02/13 222 lb 6.4 oz (100.88 kg)  01/04/13 224 lb (101.606 kg)  11/11/12 241 lb 9.6 oz (109.589 kg)  10/06/12 249 lb 8 oz (113.172 kg)  08/30/12 263 lb 8 oz (119.523 kg)  07/23/12 277 lb 12.8 oz (126.009 kg)  07/20/12 280 lb (127.007 kg)  07/05/12 300 lb 2 oz (136.136 kg)  07/05/12 300 lb 2 oz (136.136 kg)  06/30/12 305 lb 6.4 oz (138.529 kg)  06/23/12 308 lb 12.8 oz (140.071 kg)  06/17/12 313 lb 3.2 oz (142.067 kg)    Body mass index is 29.41 kg/(m^2). Patient meets criteria for overweight based on current BMI.   Current diet order is Regular, patient is consuming approximately 100% of meals at this time. Labs and medications reviewed.   Intervention -Juven supplement BID, each provides 80 kcal, 14 g protein   If nutrition issues arise, please consult RD.   Larey Seat, RD, LDN Pager #: [phone] After-Hours Pager #: [phone]
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
## 2013-11-23 NOTE — BHH Suicide Risk Assessment (Signed)
Suicide Risk Assessment  Admission Assessment     Nursing information obtained from:    Demographic factors:    Current Mental Status:    Loss Factors:    Historical Factors:    Risk Reduction Factors:    Total Time spent with patient: 45 minutes  CLINICAL FACTORS:   Depression:   Aggression Impulsivity Severe  Psychiatric Specialty Exam:     Blood pressure 111/79, pulse 71, temperature 98 F (36.7 C), temperature source Oral, resp. rate 18, height 5\' 10"  (1.778 m), weight 92.987 kg (205 lb).Body mass index is 29.41 kg/(m^2).  General Appearance: Fairly Groomed  Engineer, water::  Fair  Speech:  Clear and Coherent  Volume:  Normal  Mood:  Anxious and Depressed  Affect:  Depressed, Tearful and anxious, worried  Thought Process:  Coherent and Goal Directed  Orientation:  Full (Time, Place, and Person)  Thought Content:  events symtpoms worries concerns fear of losing control  Suicidal Thoughts:  Yes.  without intent/plan  Homicidal Thoughts:  No  Memory:  Immediate;   Fair Recent;   Fair Remote;   Fair  Judgement:  Fair  Insight:  Present  Psychomotor Activity:  Restlessness  Concentration:  Fair  Recall:  [employer] of Knowledge:NA  Language: Fair  Akathisia:  No  Handed:    AIMS (if indicated):     Assets:  Desire for Improvement Housing Social Support Vocational/Educational  Sleep:  Number of Hours: 4.25   Musculoskeletal: Strength & Muscle Tone: within normal limits Gait & Station: normal Patient leans: N/A  COGNITIVE FEATURES THAT CONTRIBUTE TO RISK:  Polarized thinking Thought constriction (tunnel vision)    SUICIDE RISK:   Moderate:  Frequent suicidal ideation with limited intensity, and duration, some specificity in terms of plans, no associated intent, good self-control, limited dysphoria/symptomatology, some risk factors present, and identifiable protective factors, including available and accessible social support.  PLAN OF CARE: Supportive  approach/coping skills/CBT mindfulness/anger management                               Reassess and optimize response to psychotropics                               Will continue the Cymbalta at 60 mg increase the Abilify to 5 mg and add Lamictal 25 mg I certify that inpatient services furnished can reasonably be expected to improve the patient's condition.  Brittany [name] A 11/23/2013, 3:31 PM

## 2013-11-23 NOTE — BHH Group Notes (Signed)
[place] LCSW Group Therapy  Emotional Regulation 1:15 - 2: 30 PM        11/23/2013     Type of Therapy:  Group Therapy  Participation Level:  Appropriate  Participation Quality:  Appropriate  Affect:  Depessed  Cognitive:  Attentive Appropriate  Insight:  Developing/Improving Engaged  Engagement in Therapy:  Developing/Improving Engaged  Modes of Intervention:  Discussion Exploration Problem-Solving Supportive  Summary of Progress/Problems:  Group topic was emotional regulations.  Patient participated in the discussion and was able to identify an emotion that needed to regulated.  She talked about the need to better control her anger. Patient shared she has a lot of angry outburst and throws things which has resulted in negative relationships with co-workers.  Patient was able to identify approprite coping skills.  Concha Pyo 11/23/2013

## 2013-11-23 NOTE — BHH Group Notes (Signed)
[facility] LCSW Aftercare Discharge Planning Group Note   11/23/2013 10:49 AM  Participation Quality:  Active  Mood/Affect:  Depressed, Flat and Tearful  Depression Rating:  9  Anxiety Rating:  7  Thoughts of Suicide:  Yes  Will you contract for safety?   Yes  Current AVH:  No  Plan for Discharge/Comments:  Patient attended group and talked about her history with depression.  She advised of having an outpatient therapist but not having a psychiatrist to manager her medications.  She had been seeing for PCP for medications.  She will return to the home with her husband and is agreeable to follow up with [facility] for medications.  CSW provided patients with workbook for groups.  Transportation Means: Patient has transportation  Supports:  Patient reports having a support family  Tradarius Reinwald, Eulas Post

## 2013-11-23 NOTE — Progress Notes (Signed)
D: Patient's affect appropriate to circumstance and mood is depressed. She reported on the self inventory sheet that sleep and ability to concentrate are both poor, good appetite and low energy level. Patient rated depression "7", feelings of hopelessness "6" and anxiety "5". She's actively participating in groups, visible in the milieu and interactive with peers in the dayroom. In compliance with current medication regimen.  A: Support and encouragement provided to patient. Scheduled medications administered per MD orders. Maintain Q15 minute checks for safety.  R: Patient receptive. Endorses passive SI, but contracts for safety. Denies HI and AVH. Patient remains safe.

## 2013-11-23 NOTE — BHH Suicide Risk Assessment (Signed)
[place] INPATIENT:  Family/Significant Other Suicide Prevention Education  Suicide Prevention Education:  Education Completed; Terea Neubauer, Husband, [phone];  has been identified by the patient as the family member/significant other with whom the patient will be residing, and identified as the person(s) who will aid the patient in the event of a mental health crisis (suicidal ideations/suicide attempt).  With written consent from the patient, the family member/significant other has been provided the following suicide prevention education, prior to the and/or following the discharge of the patient.  The suicide prevention education provided includes the following:  Suicide risk factors  Suicide prevention and interventions  National Suicide Hotline telephone number  [facility] assessment telephone number  [facility] Emergency Assistance [address] and/or Residential Mobile Crisis Unit telephone number  Request made of family/significant other to:  Remove weapons (e.g., guns, rifles, knives), all items previously/currently identified as safety concern.   Husband advised guns are secure guns.    Remove drugs/medications (over-the-counter, prescriptions, illicit drugs), all items previously/currently identified as a safety concern.  The family member/significant other verbalizes understanding of the suicide prevention education information provided.  The family member/significant other agrees to remove the items of safety concern listed above.  Concha Pyo 11/23/2013, 10:54 AM

## 2013-11-24 DIAGNOSIS — F39 Unspecified mood [affective] disorder: Secondary | ICD-10-CM

## 2013-11-24 DIAGNOSIS — R45851 Suicidal ideations: Secondary | ICD-10-CM

## 2013-11-24 NOTE — Progress Notes (Signed)
Adult Psychoeducational Group Note  Date:  11/24/2013 Time:  10:08 PM  Group Topic/Focus:  Wrap-Up Group:   The focus of this group is to help patients review their daily goal of treatment and discuss progress on daily workbooks.  Participation Level:  Active  Participation Quality:  Appropriate  Affect:  Appropriate  Cognitive:  Alert and Oriented  Insight: Appropriate  Engagement in Group:  Developing/Improving  Modes of Intervention:  Exploration and Support  Additional Comments:  Patient rated her day as a being between a 1 and a 5. Patient stated that she didn't have a good day due to her racing thoughts and SI. Patient stated that she was able to talk to others, journal, and stretch for coping skills. Patient stated that she would ensure that she had a good day tomorrow by doing those same coping skills.  Macey Wurtz, Patrick North 11/24/2013, 10:08 PM

## 2013-11-24 NOTE — Progress Notes (Signed)
Adult Psychoeducational Group Note  Date:  11/24/2013 Time:  7:20 PM  Group Topic/Focus:  Overcoming Stress:   The focus of this group is to define stress and help patients assess their triggers.  Participation Level:  Active  Participation Quality:  Appropriate and Attentive  Affect:  Appropriate  Cognitive:  Alert and Appropriate  Insight: Appropriate and Good  Engagement in Group:  Engaged  Modes of Intervention:  Clarification and Problem-solving  Additional Comments:  Pt. Attended group and participated well.  Sharol Harness 11/24/2013, 7:20 PM

## 2013-11-24 NOTE — Progress Notes (Addendum)
D: Pt presents flat in affects but brightens upon interaction. Pt verbalizes that she continues to repeatedly have suicidal thoughts. However, pt reports that she is not impulsive to causing any direct harm to herself at this time. Pt actively participates within the milieu. Pt denies any additional concerns she wishes for this writer to address at this time.  A: Writer administered Ambien, per MD orders to pt. Continued support and availably as needed was extended to this pt. Staff continue to monitor pt with q41min checks.  R: No adverse drug reactions noted. Pt receptive to treatment. Pt remains safe at this time.

## 2013-11-24 NOTE — Progress Notes (Signed)
D: Patient has appropriate affect and depressed mood. She reported on the self inventory sheet that sleep is fair, appetite is good, low energy level and poor ability to concentrate. Patient rates depression "7", feelings of hopelessness "5" and anxiety "4". She's attending groups throughout the day. Patient adheres to all medications.   A: Support and encouragement provided to patient. Administered scheduled medications per ordering MD. Monitor Q15 minute checks for safety.  R: Patient receptive. Continues to endorse passive SI, however patient does contract for safety. Denies HI and AVH. Patient remains safe on the unit.

## 2013-11-24 NOTE — BHH Group Notes (Signed)
[place] LCSW Group Therapy  [facility] 1:15 - 2:30 PM  11/24/2013 2:22 PM   Type of Therapy:  Group Therapy  Participation Level: Active  Participation Quality:  Attentive  Affect:  Appropriate  Cognitive:  Appropriate  Insight:  Developing/Improving   Engagement in Therapy:  Developing/Improving   Modes of Intervention:  Discussion, Education, Exploration, Problem-Solving, Rapport Building, Support   Summary of Progress/Problems: Patient was attentive and engaged with speaker from [facility].  Patient was attentive to speaker while they shared their story of dealing with mental health and overcoming it.  Patient expressed interest in their programs and services and received information on their agency.    Concha Pyo 11/24/2013 2:22 PM
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
## 2013-11-24 NOTE — Progress Notes (Signed)
Adult Psychoeducational Group Note  Date:  11/24/2013 Time:  9:00 AM  Group Topic/Focus:  Morning Wellness Group  Participation Level:  Active  Participation Quality:  Appropriate and Attentive  Affect:  Appropriate  Cognitive:  Alert and Oriented  Insight: Good  Engagement in Group:  Engaged  Modes of Intervention:  Discussion  Additional Comments: Patient's goal is to clear her head of racing thoughts and have a more positive outlook on things in her life.  Kathlen Brunswick 11/24/2013, 6:41 PM

## 2013-11-25 MED ORDER — ARIPIPRAZOLE 2 MG PO TABS
2.0000 mg | ORAL_TABLET | Freq: Two times a day (BID) | ORAL | Status: DC
  Administered 2013-11-26: 2 mg via ORAL
  Filled 2013-11-25 (×5): qty 1

## 2013-11-25 MED ORDER — LORAZEPAM 1 MG PO TABS
1.0000 mg | ORAL_TABLET | Freq: Four times a day (QID) | ORAL | Status: DC | PRN
  Administered 2013-11-25: 1 mg via ORAL
  Filled 2013-11-25: qty 1

## 2013-11-25 NOTE — Progress Notes (Signed)
Patient ID: Brittany [name], female   DOB: 18-May-1965, 48 y.o.   MRN: [id number] Having difficulty with anxiety today. Walked, took a Sports administrator, interacted with peers and staff, but after an hour still feeling anxious and jittery. No prn available for her anxiety. Encouraged to tell Dr.today about her anxiety and to have him consider giving her a prn for nervousness.

## 2013-11-25 NOTE — Progress Notes (Signed)
[facility] MD Progress Note  11/25/2013 6:01 PM Brittany [name]  MRN:  [id number] Subjective:  States she experienced an acute episode of anxiety in the morning. States there was nothing she could do to help it. Describes it like internal restlessness had to move, to pace. She is still very worried about how unpredictable her mood is. She states she really wants to get some sense of control over herself her mood her life. Does not like this state of mind. States this is when the suicidal thoughts come into her mind Diagnosis:   DSM5: Trauma-Stressor Disorders:  Posttraumatic Stress Disorder (309.81) Depressive Disorders:  Major Depressive Disorder - Severe (296.23) Total Time spent with patient: 30 minutes  Axis I: Mood Disorder NOS  ADL's:  Intact  Sleep: Fair  Appetite:  Fair  SPsychiatric Specialty Exam: Physical Exam  Review of Systems  Constitutional: Negative.   HENT: Negative.   Eyes: Negative.   Respiratory: Negative.   Cardiovascular: Negative.   Gastrointestinal: Negative.   Genitourinary: Negative.   Musculoskeletal: Negative.   Skin: Negative.   Neurological: Negative.   Endo/Heme/Allergies: Negative.   Psychiatric/Behavioral: Positive for depression and suicidal ideas. The patient is nervous/anxious.     Blood pressure 111/70, pulse 96, temperature 97.6 F (36.4 C), temperature source Oral, resp. rate 18, height 5\' 10"  (1.778 m), weight 92.987 kg (205 lb).Body mass index is 29.41 kg/(m^2).  General Appearance: Fairly Groomed  Engineer, water::  Fair  Speech:  Clear and Coherent  Volume:  fluctuates  Mood:  Anxious, Depressed and Dysphoric  Affect:  anxious, worried  Thought Process:  Coherent and Goal Directed  Orientation:  Full (Time, Place, and Person)  Thought Content:  symptoms events worries concerns  Suicidal Thoughts:  No  Homicidal Thoughts:  No  Memory:  Immediate;   Fair Recent;   Fair Remote;   Fair  Judgement:  Fair  Insight:  Present  Psychomotor  Activity:  Restlessness  Concentration:  Fair  Recall:  [employer] of Knowledge:NA  Language: Fair  Akathisia:  Yes  Handed:    AIMS (if indicated):     Assets:  Desire for Improvement Housing Social Support Vocational/Educational  Sleep:  Number of Hours: 6.5   Musculoskeletal: Strength & Muscle Tone: within normal limits Gait & Station: normal Patient leans: N/A  Current Medications: Current Facility-Administered Medications  Medication Dose Route Frequency Provider Last Rate Last Dose  . acetaminophen (TYLENOL) tablet 650 mg  650 mg Oral Q6H PRN Clarene Reamer, MD   650 mg at 11/25/13 2694  . alum & mag hydroxide-simeth (MAALOX/MYLANTA) 200-200-20 MG/5ML suspension 30 mL  30 mL Oral Q4H PRN Clarene Reamer, MD      . ARIPiprazole (ABILIFY) tablet 5 mg  5 mg Oral Daily Nicholaus Bloom, MD   5 mg at 11/25/13 8546  . aspirin-acetaminophen-caffeine (EXCEDRIN MIGRAINE) per tablet 2 tablet  2 tablet Oral Q6H PRN Clarene Reamer, MD      . cyanocobalamin tablet 500 mcg  500 mcg Oral Daily Clarene Reamer, MD   500 mcg at 11/25/13 2703  . DULoxetine (CYMBALTA) DR capsule 60 mg  60 mg Oral Daily Clarene Reamer, MD   60 mg at 11/25/13 5009  . estradiol (CLIMARA - Dosed in mg/24 hr) patch 0.1 mg  0.1 mg Transdermal Weekly Clarene Reamer, MD   0.1 mg at 11/24/13 0949  . lamoTRIgine (LAMICTAL) tablet 25 mg  25 mg Oral Daily Nicholaus Bloom, MD  25 mg at 11/25/13 0807  . LORazepam (ATIVAN) tablet 1 mg  1 mg Oral Q6H PRN Nicholaus Bloom, MD   1 mg at 11/25/13 1357  . magnesium hydroxide (MILK OF MAGNESIA) suspension 30 mL  30 mL Oral Daily PRN Clarene Reamer, MD      . multivitamin with minerals tablet 1 tablet  1 tablet Oral Daily Clarene Reamer, MD   1 tablet at 11/25/13 0807  . nutrition supplement (JUVEN) (JUVEN) powder packet 1 packet  1 packet Oral BID BM Elyse A Shearer, RD   1 packet at 11/25/13 1030  . zolpidem (AMBIEN) tablet 10 mg  10 mg Oral QHS PRN Nicholaus Bloom, MD   10 mg at  11/24/13 2127    Lab Results: No results found for this or any previous visit (from the past 48 hour(s)).  Physical Findings: AIMS: Facial and Oral Movements Muscles of Facial Expression: None, normal Lips and Perioral Area: None, normal Jaw: None, normal Tongue: None, normal,Extremity Movements Upper (arms, wrists, hands, fingers): None, normal Lower (legs, knees, ankles, toes): None, normal, Trunk Movements Neck, shoulders, hips: None, normal, Overall Severity Severity of abnormal movements (highest score from questions above): None, normal Incapacitation due to abnormal movements: None, normal Patient's awareness of abnormal movements (rate only patient's report): No Awareness, Dental Status Current problems with teeth and/or dentures?: No Does patient usually wear dentures?: No  CIWA:    COWS:     Treatment Plan Summary: Daily contact with patient to assess and evaluate symptoms and progress in treatment Medication management  Plan: Supportive approach/coping skills/CBT;mindfulness           Most probably akathisia from Abilify          Will use Ativan 1 mg Q 6 PRN anxiety and reassess the use of Abilify  Medical Decision Making Problem Points:  Established problem, worsening (2) and Review of psycho-social stressors (1) Data Points:  Review of medication regiment & side effects (2) Review of new medications or change in dosage (2)  I certify that inpatient services furnished can reasonably be expected to improve the patient's condition.   Jemari Hallum A 11/25/2013, 6:01 PM

## 2013-11-25 NOTE — Progress Notes (Signed)
Adult Psychoeducational Group Note  Date:  11/25/2013 Time:  8:00 PM   Group Topic/Focus:  Wrap-Up Group:   The focus of this group is to help patients review their daily goal of treatment and discuss progress on daily workbooks.  Participation Level:  Active  Participation Quality:  Appropriate  Affect:  Appropriate  Cognitive:  Appropriate  Insight: Appropriate  Engagement in Group:  Engaged and Supportive  Modes of Intervention:  Activity, Discussion and Support  Additional Comments:  Reported feeling loved.  Wanted to make it to see another day and keep positive thoughts.  Leroy Sea N 11/25/2013, 11:39 PM

## 2013-11-25 NOTE — Progress Notes (Signed)
D: Pt denies SI/HI/AVH. Pt is pleasant and cooperative. Pt stated she had a bad day today, but it got better.    A: Pt was offered support and encouragement. Pt was given scheduled medications. Pt was encourage to attend groups. Q 15 minute checks were done for safety. Discussed different relaxation techniques.  R:Pt attends groups and interacts well with peers and staff. Pt is taking medication. Pt has no complaints at this time.Pt receptive to treatment and safety maintained on unit. Pt said she will try different relaxation techniques.

## 2013-11-25 NOTE — [facility] Group Notes (Signed)
[place] LCSW Group Therapy 11/25/2013 1:15 PM Type of Therapy: Group Therapy Participation Level: Active  Participation Quality: Attentive, Sharing and Supportive  Affect: Depressed and Flat  Cognitive: Alert and Oriented  Insight: Developing/Improving and Engaged  Engagement in Therapy: Developing/Improving and Engaged  Modes of Intervention: Clarification, Confrontation, Discussion, Education, Exploration, Limit-setting, Orientation, Problem-solving, Rapport Building, Art therapist, Socialization and Support  Summary of Progress/Problems: The topic for today was feelings about relapse. Pt discussed what relapse prevention is to them and identified triggers that they are on the path to relapse. Pt processed their feeling towards relapse and was able to relate to peers. Pt discussed coping skills that can be used for relapse prevention. Patient shared that she has had a "rough day" and has been trying to utilize coping skills such as breathing, reading, and walking. She reports that she did not identify herself as "a depressed person or someone dealing with anxiety" prior to admission. Patient shared with the group her relapse habits of isolating herself and not going out in public other than work. Patient and CSW discussed ways in which she can engage a support person in her recovery and gauged her motivation to engage in social activities after hospitalization. CSW and other group members provided patient with support.    Tilden Fossa, MSW, [place] Worker [facility] [phone]

## 2013-11-25 NOTE — Tx Team (Signed)
Interdisciplinary Treatment Plan Update   Date Reviewed:  11/25/2013  Time Reviewed:  8:39 AM  Progress in Treatment:   Attending groups: Yes Participating in groups: Yes Taking medication as prescribed: Yes  Tolerating medication: Yes Family/Significant other contact made:  Yes, collateral contact made with family. Patient understands diagnosis: Yes patient understands diagnosis and is seeking help with SI  Discussing patient identified problems/goals with staff: Yes Medical problems stabilized or resolved: Yes Denies suicidal/homicidal ideation: No, but contracts for safety Patient has not harmed self or others: Yes  For review of initial/current patient goals, please see plan of care.  Estimated Length of Stay:  3-4 days  Reasons for Continued Hospitalization:  Anxiety Depression Medication stabilization Suicidal Ideation   New Problems/Goals identified:    Discharge Plan or Barriers:   Home with outpatient follow up to [facility]  Additional Comments:  Attendees:  Patient:  11/25/2013 8:39 AM   Signature: Carlton Adam, MD 11/25/2013 8:39 AM  Signature: Agustina Caroli, NP 11/25/2013 8:39 AM  Signature:  Eduard Roux, RN 11/25/2013 8:39 AM  Signature:  Patrecia Pace, RN 11/25/2013 8:39 AM  Signature:  Drake Leach, RN 11/25/2013 8:39 AM  Signature:  Joette Catching, LCSW 11/25/2013 8:39 AM  Signature:  Kristen Drinkard, LCSW-A 11/25/2013 8:39 AM  Signature: York Spaniel, Lead LCSW  11/25/2013 8:39 AM  Signature:  Maxie Better, LCSW-A 11/25/2013 8:39 AM  Signature:  11/25/2013  8:39 AM  Signature:   Lars Pinks, RN [facility] 11/25/2013  8:39 AM  Signature: 11/25/2013  8:39 AM    Scribe for Treatment Team:   Joette Catching,  11/25/2013 8:39 AM

## 2013-11-26 MED ORDER — DULOXETINE HCL 30 MG PO CPEP
30.0000 mg | ORAL_CAPSULE | Freq: Every day | ORAL | Status: DC
  Administered 2013-11-27 – 2013-11-30 (×4): 30 mg via ORAL
  Filled 2013-11-26 (×3): qty 1
  Filled 2013-11-26: qty 4
  Filled 2013-11-26 (×2): qty 1

## 2013-11-26 MED ORDER — CLONAZEPAM 0.5 MG PO TABS
0.5000 mg | ORAL_TABLET | Freq: Two times a day (BID) | ORAL | Status: DC
  Administered 2013-11-26 – 2013-11-30 (×8): 0.5 mg via ORAL
  Filled 2013-11-26 (×8): qty 1

## 2013-11-26 MED ORDER — OXCARBAZEPINE 150 MG PO TABS
150.0000 mg | ORAL_TABLET | Freq: Two times a day (BID) | ORAL | Status: DC
  Administered 2013-11-26 – 2013-11-30 (×8): 150 mg via ORAL
  Filled 2013-11-26 (×6): qty 1
  Filled 2013-11-26: qty 8
  Filled 2013-11-26 (×3): qty 1
  Filled 2013-11-26: qty 8
  Filled 2013-11-26 (×2): qty 1

## 2013-11-26 NOTE — Progress Notes (Addendum)
Psychoeducational Group Note  Date:  11/26/2013 Time: 1015  Group Topic/Focus:  Identifying Needs:   The focus of this group is to help patients identify their personal needs that have been historically problematic and identify healthy behaviors to address their needs.  Participation Level:  active  Participation Quality: good   Affect: flat  Cognitive: fair   Insight:  limited  Engagement in Group: engaged  Additional Comments: Pt was engaged in group, asked appropriate questions, shared personal life experience with the group and demonstrates willingness to process and understand her problems. [place]

## 2013-11-26 NOTE — Progress Notes (Signed)
Adult Psychoeducational Group Note  Date:  11/26/2013 Time:  8:00 PM   Group Topic/Focus:  Wrap-Up Group:   The focus of this group is to help patients review their daily goal of treatment and discuss progress on daily workbooks.  Participation Level:  Minimal  Participation Quality:  Appropriate and Attentive  Affect:  Appropriate  Cognitive:  Appropriate  Insight: Appropriate  Engagement in Group:  Limited  Modes of Intervention:  Discussion  Additional Comments:  Patient reported have a bad morning, but working to utilize coping skills (e.g. journaling) to assist her with feeling better.    Leroy Sea N 11/26/2013, 10:38 PM

## 2013-11-26 NOTE — Progress Notes (Signed)
Pt has been up and active in the milieu today.  She rated both depression and hopelessness a 5 and anxiety a 7 on her self-inventory.  Pt does have some passive S/I with no plan here in the hospital and contracts to come to staff.  Her goal today,"remain calm and relax use skills by journaling, breathing, read and color. She is unsure of her discharge plan at this time.

## 2013-11-26 NOTE — Progress Notes (Addendum)
Psychoeducational Group Note  Date: 11/26/2013 Time: 0930  Group Topic/Focus:  Identifying Needs:   The focus of this group is to help patients identify their personal needs that have been historically problematic and identify healthy behaviors to address their needs.  Participation Level:  Active  Participation Quality:  Appropriate  Affect:  Appropriate  Cognitive:  Appropriate  Insight:  Engaged  Engagement in Group:  Engaged  Additional Comments:    8/15/201512:26 PM Calvina Liptak, Trixie Rude

## 2013-11-26 NOTE — B[facility] Group Notes (Signed)
[place] Group Notes:  (Clinical Social Work)  11/26/2013   1:15-2:15PM  Summary of Progress/Problems:   The main focus of today's process group was for the patient to identify ways in which they have sabotaged their own mental health wellness/recovery.  Motivational interviewing and a handout were used to explore the benefits and costs of their self-sabotaging behavior as well as the benefits and costs of changing this behavior.  The Stages of Change were explained to the group using a handout, and patients identified where they are with regard to changing self-defeating behaviors.  The patient expressed she self-sabotages with rumination.  She worked hard throughout group to complete the exercises and increase her self-knowledge.  Type of Therapy:  Process Group  Participation Level:  Active  Participation Quality:  Attentive and Sharing  Affect:  Blunted and Depressed  Cognitive:  Appropriate and Oriented  Insight:  Developing/Improving  Engagement in Therapy:  Engaged  Modes of Intervention:  Education, Motivational Interviewing   Selmer Dominion, LCSW 11/26/2013, 4:00pm

## 2013-11-26 NOTE — Progress Notes (Signed)
[facility] MD Progress Note  11/26/2013 3:21 PM Brittany [name]  MRN:  [id number] Subjective:  Having a very hard time. She has experienced drastic mood fluctuation in the last 24 hours. The possibility of side effect to the medication was considered but she states this is what she goes trough when she is out of here. States she cant control them. States that she spoke with her daughter's yesterday and that this was very stressful and emotional. She also spoke with other family members. States they did not know what was going on. States she is waiting to communicate with another friend's of her who he states her mood swings have ruined the relationship with, but states she could not handle it.  Diagnosis:   DSM5: Trauma-Stressor Disorders:  Posttraumatic Stress Disorder (309.81) Depressive Disorders:  Major Depressive Disorder - Severe (296.23) Total Time spent with patient: 30 minutes  Axis I: Mood Disorder NOS  ADL's:  Intact  Sleep: Fair  Appetite:  Fair  SPsychiatric Specialty Exam: Physical Exam  Review of Systems  Constitutional: Negative.   HENT: Negative.   Eyes: Negative.   Respiratory: Negative.   Cardiovascular: Negative.   Gastrointestinal: Negative.   Genitourinary: Negative.   Musculoskeletal: Negative.   Skin: Negative.   Neurological: Negative.   Endo/Heme/Allergies: Negative.   Psychiatric/Behavioral: Positive for depression and suicidal ideas. The patient is nervous/anxious.     Blood pressure 107/73, pulse 90, temperature 97.9 F (36.6 C), temperature source Oral, resp. rate 16, height 5\' 10"  (1.778 m), weight 92.987 kg (205 lb).Body mass index is 29.41 kg/(m^2).  General Appearance: Fairly Groomed  Engineer, water::  Minimal  Speech:  Clear and Coherent  Volume:  Decreased  Mood:  Anxious and Depressed  Affect:  Depressed, Tearful and anxious, worried  Thought Process:  Coherent and Goal Directed  Orientation:  Full (Time, Place, and Person)  Thought Content:   symptoms events worries concerns  Suicidal Thoughts:  Intermittent  Homicidal Thoughts:  No  Memory:  Immediate;   Fair Recent;   Fair Remote;   Fair  Judgement:  Fair  Insight:  Present  Psychomotor Activity:  Restlessness  Concentration:  Fair  Recall:  [employer] of Knowledge:NA  Language: Fair  Akathisia:  No  Handed:    AIMS (if indicated):     Assets:  Desire for Improvement Housing Social Support Vocational/Educational  Sleep:  Number of Hours: 6.5   Musculoskeletal: Strength & Muscle Tone: within normal limits Gait & Station: normal Patient leans: N/A  Current Medications: Current Facility-Administered Medications  Medication Dose Route Frequency Provider Last Rate Last Dose  . acetaminophen (TYLENOL) tablet 650 mg  650 mg Oral Q6H PRN Clarene Reamer, MD   650 mg at 11/25/13 4098  . alum & mag hydroxide-simeth (MAALOX/MYLANTA) 200-200-20 MG/5ML suspension 30 mL  30 mL Oral Q4H PRN Clarene Reamer, MD      . ARIPiprazole (ABILIFY) tablet 2 mg  2 mg Oral BID Nicholaus Bloom, MD   2 mg at 11/26/13 0834  . aspirin-acetaminophen-caffeine (EXCEDRIN MIGRAINE) per tablet 2 tablet  2 tablet Oral Q6H PRN Clarene Reamer, MD      . cyanocobalamin tablet 500 mcg  500 mcg Oral Daily Clarene Reamer, MD   500 mcg at 11/26/13 [phone]  . DULoxetine (CYMBALTA) DR capsule 60 mg  60 mg Oral Daily Clarene Reamer, MD   60 mg at 11/26/13 0835  . estradiol (CLIMARA - Dosed in mg/24 hr) patch 0.1  mg  0.1 mg Transdermal Weekly Clarene Reamer, MD   0.1 mg at 11/24/13 0949  . lamoTRIgine (LAMICTAL) tablet 25 mg  25 mg Oral Daily Nicholaus Bloom, MD   25 mg at 11/26/13 0835  . LORazepam (ATIVAN) tablet 1 mg  1 mg Oral Q6H PRN Nicholaus Bloom, MD   1 mg at 11/25/13 1357  . magnesium hydroxide (MILK OF MAGNESIA) suspension 30 mL  30 mL Oral Daily PRN Clarene Reamer, MD      . multivitamin with minerals tablet 1 tablet  1 tablet Oral Daily Clarene Reamer, MD   1 tablet at 11/26/13 [id number]  . nutrition  supplement (JUVEN) (JUVEN) powder packet 1 packet  1 packet Oral BID BM Elyse A Shearer, RD   1 packet at 11/26/13 0940  . zolpidem (AMBIEN) tablet 10 mg  10 mg Oral QHS PRN Nicholaus Bloom, MD   10 mg at 11/25/13 2213    Lab Results: No results found for this or any previous visit (from the past 48 hour(s)).  Physical Findings: AIMS: Facial and Oral Movements Muscles of Facial Expression: None, normal Lips and Perioral Area: None, normal Jaw: None, normal Tongue: None, normal,Extremity Movements Upper (arms, wrists, hands, fingers): None, normal Lower (legs, knees, ankles, toes): None, normal, Trunk Movements Neck, shoulders, hips: None, normal, Overall Severity Severity of abnormal movements (highest score from questions above): None, normal Incapacitation due to abnormal movements: None, normal Patient's awareness of abnormal movements (rate only patient's report): No Awareness, Dental Status Current problems with teeth and/or dentures?: No Does patient usually wear dentures?: No  CIWA:    COWS:     Treatment Plan Summary: Daily contact with patient to assess and evaluate symptoms and progress in treatment Medication management  Plan: D/C the Abilify           Trial with Trileptal as a mood stabilizer           Consider changing the Cymbalta Medical Decision Making Problem Points:  Established problem, worsening (2) and Review of psycho-social stressors (1) Data Points:  Review of medication regiment & side effects (2) Review of new medications or change in dosage (2)  I certify that inpatient services furnished can reasonably be expected to improve the patient's condition.   Catarina Huntley A 11/26/2013, 3:21 PM

## 2013-11-27 NOTE — Progress Notes (Signed)
D: Pt presents with anxious affect and mood.  Pt denies SI/HI.  Denies AH/VH.  Pt reports "my day today was much better than yesterday.  I had panic attacks the past few days, but the doctor changed my medications today and I feel a lot better so far.  The only people I have talked to about what has been going on with me are my husband and my boss, but today I opened up.  It was hard, but I feel like it was the right thing to do.  I opened up and told the rest of my family what was going on." A: Encouragement and support offered.  Safety maintained.  Medications administered per order. R: Pt interacts appropriately with peers and staff.  Attended evening group.  Pt is in no acute distress at this time.  Pt verbally contracts for safety, stating "I will let you know if I feel like hurting myself or someone else.  I felt like that yesterday, but I'm not thinking about now."

## 2013-11-27 NOTE — Progress Notes (Signed)
[facility] MD Progress Note  11/27/2013 1:39 PM Brittany [name]  MRN:  [id number] Subjective:  States that yesterday was a very bad day. She admits she was very emotional. She communicated with a lot of her family members who did not know how bad she was "but you have always been so strong" she did not sleep too well last night but admits she isolated and took some naps trying to cope with the way she was feeling as that is what she does at home. States that the episode of irritability anger she exhibited yesterday is not unlike the ones that brought her here. She states she is a little better today. She still needs to call the friend she thinks that her behavior affected the relationship with.  Diagnosis:   DSM5: Trauma-Stressor Disorders:  Posttraumatic Stress Disorder (309.81) Depressive Disorders:  Major Depressive Disorder - Moderate (296.22) Total Time spent with patient: 30 minutes  Axis I: Mood Disorder NOS  ADL's:  Intact  Sleep: Poor  Appetite:  Fair  Psychiatric Specialty Exam: Physical Exam  Review of Systems  Constitutional: Negative.   HENT: Negative.   Eyes: Negative.   Respiratory: Negative.   Cardiovascular: Negative.   Gastrointestinal: Negative.   Genitourinary: Negative.   Musculoskeletal: Negative.   Skin: Negative.   Neurological: Negative.   Endo/Heme/Allergies: Negative.   Psychiatric/Behavioral: Positive for depression and suicidal ideas. The patient is nervous/anxious and has insomnia.     Blood pressure 121/75, pulse 67, temperature 98.7 F (37.1 C), temperature source Oral, resp. rate 20, height 5\' 10"  (1.778 m), weight 92.987 kg (205 lb).Body mass index is 29.41 kg/(m^2).  General Appearance: Fairly Groomed  Engineer, water::  Fair  Speech:  Clear and Coherent  Volume:  Normal  Mood:  Anxious and worried  Affect:  anxious worried  Thought Process:  Coherent and Goal Directed  Orientation:  Full (Time, Place, and Person)  Thought Content:  symptoms worries  concerns, fear of losing control  Suicidal Thoughts:  No  Homicidal Thoughts:  No  Memory:  Immediate;   Fair Recent;   Fair Remote;   Fair  Judgement:  Fair  Insight:  Present  Psychomotor Activity:  Restlessness  Concentration:  Fair  Recall:  [employer] of Knowledge:NA  Language: Fair  Akathisia:  No  Handed:    AIMS (if indicated):     Assets:  Desire for Improvement Housing Social Support  Sleep:  Number of Hours: 5.5   Musculoskeletal: Strength & Muscle Tone: within normal limits Gait & Station: normal Patient leans: N/A  Current Medications: Current Facility-Administered Medications  Medication Dose Route Frequency Provider Last Rate Last Dose  . acetaminophen (TYLENOL) tablet 650 mg  650 mg Oral Q6H PRN Clarene Reamer, MD   650 mg at 11/25/13 3557  . alum & mag hydroxide-simeth (MAALOX/MYLANTA) 200-200-20 MG/5ML suspension 30 mL  30 mL Oral Q4H PRN Clarene Reamer, MD      . aspirin-acetaminophen-caffeine [facility] MIGRAINE) per tablet 2 tablet  2 tablet Oral Q6H PRN Clarene Reamer, MD      . clonazePAM [place] [place]) tablet 0.5 mg  0.5 mg Oral BID Nicholaus Bloom, MD   0.5 mg at 11/27/13 0910  . cyanocobalamin tablet 500 mcg  500 mcg Oral Daily Clarene Reamer, MD   500 mcg at 11/27/13 0908  . DULoxetine (CYMBALTA) DR capsule 30 mg  30 mg Oral Daily Nicholaus Bloom, MD   30 mg at 11/27/13 0908  .  estradiol (CLIMARA - Dosed in mg/24 hr) patch 0.1 mg  0.1 mg Transdermal Weekly Clarene Reamer, MD   0.1 mg at 11/24/13 0949  . lamoTRIgine (LAMICTAL) tablet 25 mg  25 mg Oral Daily Nicholaus Bloom, MD   25 mg at 11/27/13 1962  . magnesium hydroxide (MILK OF MAGNESIA) suspension 30 mL  30 mL Oral Daily PRN Clarene Reamer, MD      . multivitamin with minerals tablet 1 tablet  1 tablet Oral Daily Clarene Reamer, MD   1 tablet at 11/27/13 0908  . nutrition supplement (JUVEN) (JUVEN) powder packet 1 packet  1 packet Oral BID BM Elyse A Shearer, RD   1 packet at 11/26/13 0940  .  OXcarbazepine (TRILEPTAL) tablet 150 mg  150 mg Oral BID Nicholaus Bloom, MD   150 mg at 11/27/13 0908  . zolpidem (AMBIEN) tablet 10 mg  10 mg Oral QHS PRN Nicholaus Bloom, MD   10 mg at 11/26/13 2134    Lab Results: No results found for this or any previous visit (from the past 48 hour(s)).  Physical Findings: AIMS: Facial and Oral Movements Muscles of Facial Expression: None, normal Lips and Perioral Area: None, normal Jaw: None, normal Tongue: None, normal,Extremity Movements Upper (arms, wrists, hands, fingers): None, normal Lower (legs, knees, ankles, toes): None, normal, Trunk Movements Neck, shoulders, hips: None, normal, Overall Severity Severity of abnormal movements (highest score from questions above): None, normal Incapacitation due to abnormal movements: None, normal Patient's awareness of abnormal movements (rate only patient's report): No Awareness, Dental Status Current problems with teeth and/or dentures?: No Does patient usually wear dentures?: No  CIWA:    COWS:     Treatment Plan Summary: Daily contact with patient to assess and evaluate symptoms and progress in treatment Medication management  Plan: Supportive approach/coping skills/CBT mindfulness           Will pursue the Lamictal/Trileptal combination with some Klonopin trying to help the acute anxiety and provide some mood stability           Will continue the Cymbalta at 30 mg and consider dropping it altogether  (lack of effectiveness vs. causing some of the mood         instability)  Medical Decision Making Problem Points:  Review of psycho-social stressors (1) Data Points:  Review of medication regiment & side effects (2) Review of new medications or change in dosage (2)  I certify that inpatient services furnished can reasonably be expected to improve the patient's condition.   Gene Glazebrook A 11/27/2013, 1:39 PM

## 2013-11-27 NOTE — Progress Notes (Signed)
Psychoeducational Group Note  Date:  11/27/2013 Time: 1015 Group Topic/Focus:  Making Healthy Choices:   The focus of this group is to help patients identify negative/unhealthy choices they were using prior to admission and identify positive/healthier coping strategies to replace them upon discharge.  Participation Level:  Active  Participation Quality:  Attentive  Affect:  Appropriate  Cognitive:  Appropriate  Insight:  Improving  Engagement in Group:  Engaged  Additional Comments:    Lauralyn Primes 1:51 PM. 11/27/2013

## 2013-11-27 NOTE — Progress Notes (Signed)
Psychoeducational Group Note  Date:  11/27/2013 Time: 0930 Group Topic/Focus:  Making Healthy Choices:   The focus of this group is to help patients identify negative/unhealthy choices they were using prior to admission and identify positive/healthier coping strategies to replace them upon discharge.  Participation Level:  Active  Participation Quality:  Attentive  Affect:  Blunted  Cognitive:  Appropriate  Insight:  Engaged  Engagement in Group:  Engaged  Additional Comments:    Lauralyn Primes 10:17 AM. 11/27/2013

## 2013-11-27 NOTE — Progress Notes (Addendum)
Brittany [name] says she has " less anger and rage" today. She says she cont to not sleep well at night.   A She complained of a headache and  Received 2 tylenol for it  And stated she received relief after the tylenol.   R Safety is in place and poc cont.

## 2013-11-27 NOTE — B[facility] Group Notes (Signed)
[place] Group Notes:  (Clinical Social Work)  11/27/2013   1:15-2:15PM  Summary of Progress/Problems:  The main focus of today's process group was to   identify the patient's current support system and decide on other supports that can be put in place.  The picture on workbook was used to discuss why additional supports are needed.  An emphasis was placed on using counselor, doctor, therapy groups, 12-step groups, and problem-specific support groups to expand supports.   There was also an extensive discussion about what constitutes a healthy support versus an unhealthy support.  The patient expressed full comprehension of the concepts presented.  She stated she is having a hard time getting unhealthy family members to keep their distance from her.  She gave healthy feedback to another group member.  Type of Therapy:  Process Group  Participation Level:  Active  Participation Quality:  Attentive and Sharing and Supportive  Affect:  Blunted and Depressed  Cognitive:  Appropriate and Oriented  Insight:  Engaged  Engagement in Therapy:  Engaged  Modes of Intervention:  Education,  Support and [employer], LCSW 11/27/2013, 4:00pm

## 2013-11-27 NOTE — Progress Notes (Addendum)
D:  +ve SI-Pt denies HI/AVH. Pt is pleasant and cooperative. Pt stated her day started off bad and got worse as the day went on, but she started feeling better by the evening time. Pt said "I feel better now".  A: Pt was offered support and encouragement. Pt was given scheduled medications. Pt was encourage to attend groups. Q 15 minute checks were done for safety.   R:Pt attends groups and interacts well with peers and staff. Pt is taking medication. Pt has no complaints at this time .Pt receptive to treatment and safety maintained on unit.

## 2013-11-27 NOTE — Progress Notes (Signed)
Adult Psychoeducational Group Note  Date:  11/27/2013 Time:  11:04 PM  Group Topic/Focus:  Goals Group:   The focus of this group is to help patients establish daily goals to achieve during treatment and discuss how the patient can incorporate goal setting into their daily lives to aide in recovery.  Participation Level:  Active  Participation Quality:  Appropriate  Affect:  Appropriate  Cognitive:  Appropriate  Insight: Appropriate  Engagement in Group:  Engaged  Modes of Intervention:  Discussion  Additional Comments:  Pt stated today that she made amends with some people in her past and it made her feel good.  Alexis Goodell R 11/27/2013, 11:04 PM

## 2013-11-28 DIAGNOSIS — F331 Major depressive disorder, recurrent, moderate: Secondary | ICD-10-CM

## 2013-11-28 MED ORDER — TRAZODONE HCL 50 MG PO TABS
50.0000 mg | ORAL_TABLET | Freq: Every day | ORAL | Status: DC
  Administered 2013-11-28 – 2013-11-29 (×2): 50 mg via ORAL
  Filled 2013-11-28 (×3): qty 1
  Filled 2013-11-28: qty 4

## 2013-11-28 MED ORDER — LAMOTRIGINE 25 MG PO TABS
25.0000 mg | ORAL_TABLET | Freq: Once | ORAL | Status: AC
  Administered 2013-11-28: 25 mg via ORAL
  Filled 2013-11-28 (×2): qty 1

## 2013-11-28 MED ORDER — LAMOTRIGINE 25 MG PO TABS
50.0000 mg | ORAL_TABLET | Freq: Every day | ORAL | Status: DC
  Administered 2013-11-29 – 2013-11-30 (×2): 50 mg via ORAL
  Filled 2013-11-28 (×2): qty 2
  Filled 2013-11-28: qty 8
  Filled 2013-11-28: qty 2

## 2013-11-28 NOTE — B[facility] Group Notes (Signed)
[place] LCSW Group Therapy          Overcoming Obstacles       1:15 -2:30        11/28/2013       Type of Therapy:  Group Therapy  Participation Level:  Appropriate  Participation Quality:  Appropriate  Affect:  Appropriate, Alert  Cognitive:   Appropriate  Insight: Developing/Improving Engaged  Engagement in Therapy: Developing/Imprvoing Engaged  Modes of Intervention:  Discussion Exploration  Education Rapport BuildingProblem-Solving Support  Summary of Progress/Problems:  The main focus of today's group was overcoming obstacles.   She shared the obstacle she has to overcome is isolation.  She advised of asking two family members to monitor for getting out of the house more.  Patient able to identify appropriate coping skills.    Eulas Post 11/28/2013

## 2013-11-28 NOTE — Progress Notes (Signed)
Pt was observed sitting in the dayroom talking with peers.  She attended evening groups and was seen talking on the phone prior to groups.  Writer noticed that pt had been crying and asked about her sadness.  Pt reported that the tears were not from sadness.  She had made a goal of talking to the people closest to her about her depression and suicidal thoughts and found that they were very supportive and wanted to help her.  She said that for so many years she had kept her feelings to herself and that made her feel so isolated that the suicidal thoughts became overwhelming.  Pt says after she talked with her loved ones, it made her feel better.  She feels the medications are helping her with her thoughts.  Pt says she still has fleeting thoughts of suicide, but they are decreasing.  She denies HI/AV.  Pt makes her needs known to staff.  Support and encouragement offered.  Safety maintained with q15 minute checks.

## 2013-11-28 NOTE — Progress Notes (Addendum)
[facility] MD Progress Note  11/28/2013 4:17 PM Brittany [name]  MRN:  [id number] Subjective:  States that " I have good days and bad days ". Reports that she has passive SI on and off with no plan. Reports she had a tough day today and had to witness a few outbursts in group today.   Objective: Patient seen and chart reviewed. Continues to have labile mood,tearfullness at times. Reports that she is depressed and that cymbalta was not working for her .Reports passive SI .Contracts for safety. Reports sleep as fair and appetite as good. Reports that she wants to set goals for herself to improve and be a good example for her children. Denies HI/AH/VH. Denies any new concerns or problems.   Diagnosis:   DSM5: Primary psychiatric diagnosis: Major depressive disorder,recurrent ,moderate  Secondary psychiatric diagnosis: PTSD  Non psychiatric diagnosis: Migraine  Hypertension  Hypercholesterolemia  Depression  Morbid obesity  Sleep apnea  (no CPAP machine)  H/O hiatal hernia  GERD (gastroesophageal reflux disease)  Hx of  Arthritis hips, knees and hands     Total Time spent with patient: 20 minutes    ADL's:  Intact  Sleep: Fair  Appetite:  Fair  Psychiatric Specialty Exam: Physical Exam  Review of Systems  Constitutional: Negative.   HENT: Negative.   Eyes: Negative.   Respiratory: Negative.   Cardiovascular: Negative.   Gastrointestinal: Negative.   Genitourinary: Negative.   Musculoskeletal: Negative.   Skin: Negative.   Neurological: Negative.   Endo/Heme/Allergies: Negative.   Psychiatric/Behavioral: Positive for depression and suicidal ideas. The patient is nervous/anxious.     Blood pressure 107/69, pulse 74, temperature 98.1 F (36.7 C), temperature source Oral, resp. rate 20, height 5\' 10"  (1.778 m), weight 92.987 kg (205 lb).Body mass index is 29.41 kg/(m^2).  General Appearance: Fairly Groomed  Engineer, water::  Fair  Speech:  Clear and Coherent  Volume:  Normal   Mood:  Anxious and worried  Affect:  Labile and Tearful  Thought Process:  Coherent and Goal Directed  Orientation:  Full (Time, Place, and Person)  Thought Content:  symptoms worries concernsdenies AH/VH/paranoia  Suicidal Thoughts:  Yes.  without intent/plan passive on and of  Homicidal Thoughts:  No  Memory:  Immediate;   Fair Recent;   Fair Remote;   Fair  Judgement:  Fair  Insight:  Present  Psychomotor Activity:  Restlessness  Concentration:  Fair  Recall:  [employer] of Knowledge:NA  Language: Fair  Akathisia:  No  Handed:    AIMS (if indicated):     Assets:  Desire for Improvement Housing Social Support  Sleep:  Number of Hours: 5.75   Musculoskeletal: Strength & Muscle Tone: within normal limits Gait & Station: normal Patient leans: N/A  Current Medications: Current Facility-Administered Medications  Medication Dose Route Frequency Provider Last Rate Last Dose  . acetaminophen (TYLENOL) tablet 650 mg  650 mg Oral Q6H PRN Clarene Reamer, MD   650 mg at 11/27/13 2102  . alum & mag hydroxide-simeth (MAALOX/MYLANTA) 200-200-20 MG/5ML suspension 30 mL  30 mL Oral Q4H PRN Clarene Reamer, MD      . aspirin-acetaminophen-caffeine [facility] MIGRAINE) per tablet 2 tablet  2 tablet Oral Q6H PRN Clarene Reamer, MD      . clonazePAM Bobbye Charleston) tablet 0.5 mg  0.5 mg Oral BID Nicholaus Bloom, MD   0.5 mg at 11/28/13 0802  . cyanocobalamin tablet 500 mcg  500 mcg Oral Daily Clarene Reamer, MD  500 mcg at 11/28/13 0803  . DULoxetine (CYMBALTA) DR capsule 30 mg  30 mg Oral Daily Nicholaus Bloom, MD   30 mg at 11/28/13 0802  . estradiol (CLIMARA - Dosed in mg/24 hr) patch 0.1 mg  0.1 mg Transdermal Weekly Clarene Reamer, MD   0.1 mg at 11/24/13 0949  . [START ON 11/29/2013] lamoTRIgine (LAMICTAL) tablet 50 mg  50 mg Oral Daily Brylee Mcgreal, MD      . magnesium hydroxide (MILK OF MAGNESIA) suspension 30 mL  30 mL Oral Daily PRN Clarene Reamer, MD      . multivitamin with minerals  tablet 1 tablet  1 tablet Oral Daily Clarene Reamer, MD   1 tablet at 11/28/13 0802  . nutrition supplement (JUVEN) (JUVEN) powder packet 1 packet  1 packet Oral BID BM Elyse A Shearer, RD   1 packet at 11/28/13 1425  . OXcarbazepine (TRILEPTAL) tablet 150 mg  150 mg Oral BID Nicholaus Bloom, MD   150 mg at 11/28/13 0802  . traZODone (DESYREL) tablet 50 mg  50 mg Oral QHS Ursula Alert, MD        Lab Results: No results found for this or any previous visit (from the past 48 hour(s)).  Physical Findings: AIMS: Facial and Oral Movements Muscles of Facial Expression: None, normal Lips and Perioral Area: None, normal Jaw: None, normal Tongue: None, normal,Extremity Movements Upper (arms, wrists, hands, fingers): None, normal Lower (legs, knees, ankles, toes): None, normal, Trunk Movements Neck, shoulders, hips: None, normal, Overall Severity Severity of abnormal movements (highest score from questions above): None, normal Incapacitation due to abnormal movements: None, normal Patient's awareness of abnormal movements (rate only patient's report): No Awareness, Dental Status Current problems with teeth and/or dentures?: No Does patient usually wear dentures?: No  CIWA:    COWS:     Treatment Plan Summary: Daily contact with patient to assess and evaluate symptoms and progress in treatment Medication management  Plan:           Supportive approach/coping skills/CBT mindfulness           Will continue the Lamictal/Trileptal combination with some Klonopin trying to help the acute anxiety and provide some mood stability. Will increase Lamictal to 50 mg po daily.           Will continue the Cymbalta at 30 mg and consider dropping it altogether in a few days. (lack of effectiveness vs. causing some of the mood instability)           Will continue trazodone for sleep.  Medical Decision Making Problem Points:  Review of psycho-social stressors (1) Data Points:  Review of medication regiment  & side effects (2) Review of new medications or change in dosage (2)  I certify that inpatient services furnished can reasonably be expected to improve the patient's condition.   Aleaya Latona 11/28/2013, 4:17 PM

## 2013-11-28 NOTE — Progress Notes (Signed)
Patient ID: Brittany [name], female   DOB: 1966-04-12, 48 y.o.   MRN: [id number] She has been up and about and to groups interacting with peers and staff. Has been calm and cooperative. Verbally contracts not to harm herself. Self inventory this AM: depression 5, hopelessness 4, anxiety 7, denies withdrawals , Positive SI, verbally contracted for her safety.  Goal today was to work on her anxiety. She stated this afternoon that the new medication was aking her feel better more awake.

## 2013-11-28 NOTE — Progress Notes (Signed)
Patient ID: Brittany [name], female   DOB: 06-17-1965, 48 y.o.   MRN: [id number] Late Entry for 11/25/2013. D- Dr. Janece Canterbury and given to her at her request for ongoing complaint of anxiety. States she feels better than initially when anxiety started, but still feels anxious and restless. Is pleasant, active on unit and attending groups. She states she is learning a lot about herself here, and things she thought were her personality she now believes are her sx of depression. A-Support offered. Medications as ordered. Continue to monitor for safety. R-Better in the pm, continues to be active, using phone, positive peer interactions and not further complaints.

## 2013-11-28 NOTE — BHH Group Notes (Signed)
[facility] LCSW Aftercare Discharge Planning Group Note   11/28/2013 11:30 AM    Participation Quality:  Appropraite  Mood/Affect:  Appropriate  Depression Rating:  5  Anxiety Rating:  7  Thoughts of Suicide:  Yes  Will you contract for safety?   Yes  Current AVH:  No  Plan for Discharge/Comments:  Patient attended discharge planning group and actively participated in group.  Patient reports she continues have SI but less frequently.  She will [facility] up with [facility] and [facility] at discharge. CSW provided all participants with daily workbook.   Transportation Means: Patient has transportation.   Supports:  Patient has a support system.   Lekendrick Alpern, Eulas Post

## 2013-11-29 NOTE — Progress Notes (Signed)
[facility] MD Progress Note  11/29/2013 6:15 PM Brittany [name]  MRN:  [id number] Subjective:  States that she had another emotional day yesterday but states she feels the rage "is gone" right now. States she has not felt this relax in [name] long time. She still feels down and knows there is work to be done for her to get where she needs to be but she welcomes the relief she is getting from the rage. She was able to talk to the friend whom she thought was affected the most by her mood instability and went on better than she expected. She was reassured. States that now looking back she could have been more forthcoming about the way she was feeling all along Diagnosis:   DSM5: Trauma-Stressor Disorders:  Posttraumatic Stress Disorder (309.81) Depressive Disorders:  Major Depressive Disorder - Severe (296.23) Total Time spent with patient: 30 minutes  Axis I: Mood Disorder NOS  ADL's:  Intact  Sleep: Fair  Appetite:  Fair   Psychiatric Specialty Exam: Physical Exam  Review of Systems  Constitutional: Negative.   HENT: Negative.   Eyes: Negative.   Respiratory: Negative.   Cardiovascular: Negative.   Gastrointestinal: Negative.   Genitourinary: Negative.   Musculoskeletal: Negative.   Skin: Negative.   Neurological: Negative.   Endo/Heme/Allergies: Negative.   Psychiatric/Behavioral: Positive for depression. The patient is nervous/anxious.     Blood pressure 107/58, pulse 93, temperature 97.9 F (36.6 C), temperature source Oral, resp. rate 16, height 5\' 10"  (1.778 m), weight 92.987 kg (205 lb).Body mass index is 29.41 kg/(m^2).  General Appearance: Fairly Groomed  Engineer, water::  Fair  Speech:  Clear and Coherent  Volume:  Normal  Mood:  Anxious  Affect:  anxious  Thought Process:  Coherent and Goal Directed  Orientation:  Full (Time, Place, and Person)  Thought Content:  mots recent events, symptoms worries and concerns  Suicidal Thoughts:  No  Homicidal Thoughts:  No  Memory:   Immediate;   Fair Recent;   Fair Remote;   Fair  Judgement:  Fair  Insight:  Present  Psychomotor Activity:  Restlessness  Concentration:  Fair  Recall:  [employer] of Knowledge:NA  Language: Fair  Akathisia:  No  Handed:    AIMS (if indicated):     Assets:  Desire for Improvement Housing Social Support Transportation Vocational/Educational  Sleep:  Number of Hours: 5.25   Musculoskeletal: Strength & Muscle Tone: within normal limits Gait & Station: unsteady Patient leans: N/[name]  Current Medications: Current Facility-Administered Medications  Medication Dose Route Frequency Provider Last Rate Last Dose  . acetaminophen (TYLENOL) tablet 650 mg  650 mg Oral Q6H PRN Clarene Reamer, MD   650 mg at 11/27/13 2102  . alum & mag hydroxide-simeth (MAALOX/MYLANTA) 200-200-20 MG/5ML suspension 30 mL  30 mL Oral Q4H PRN Clarene Reamer, MD      . aspirin-acetaminophen-caffeine [facility] MIGRAINE) per tablet 2 tablet  2 tablet Oral Q6H PRN Clarene Reamer, MD      . clonazePAM Bobbye [place]) tablet 0.5 mg  0.5 mg Oral BID Nicholaus Bloom, MD   0.5 mg at 11/29/13 1708  . cyanocobalamin tablet 500 mcg  500 mcg Oral Daily Clarene Reamer, MD   500 mcg at 11/29/13 0759  . DULoxetine (CYMBALTA) DR capsule 30 mg  30 mg Oral Daily Nicholaus Bloom, MD   30 mg at 11/29/13 0759  . estradiol (CLIMARA - Dosed in mg/24 hr) patch 0.1 mg  0.1  mg Transdermal Weekly Clarene Reamer, MD   0.1 mg at 11/24/13 0949  . lamoTRIgine (LAMICTAL) tablet 50 mg  50 mg Oral Daily Ursula Alert, MD   50 mg at 11/29/13 0758  . magnesium hydroxide (MILK OF MAGNESIA) suspension 30 mL  30 mL Oral Daily PRN Clarene Reamer, MD      . multivitamin with minerals tablet 1 tablet  1 tablet Oral Daily Clarene Reamer, MD   1 tablet at 11/29/13 0758  . nutrition supplement (JUVEN) (JUVEN) powder packet 1 packet  1 packet Oral BID BM [name], RD   1 packet at 11/28/13 1425  . OXcarbazepine (TRILEPTAL) tablet 150 mg  150 mg Oral BID  Nicholaus Bloom, MD   150 mg at 11/29/13 1708  . traZODone (DESYREL) tablet 50 mg  50 mg Oral QHS Ursula Alert, MD   50 mg at 11/28/13 2120    Lab Results: No results found for this or any previous visit (from the past 48 hour(s)).  Physical Findings: AIMS: Facial and Oral Movements Muscles of Facial Expression: None, normal Lips and Perioral Area: None, normal Jaw: None, normal Tongue: None, normal,Extremity Movements Upper (arms, wrists, hands, fingers): None, normal Lower (legs, knees, ankles, toes): None, normal, Trunk Movements Neck, shoulders, hips: None, normal, Overall Severity Severity of abnormal movements (highest score from questions above): None, normal Incapacitation due to abnormal movements: None, normal Patient's awareness of abnormal movements (rate only patient's report): No Awareness, Dental Status Current problems with teeth and/or dentures?: No Does patient usually wear dentures?: No  CIWA:    COWS:     Treatment Plan Summary: Daily contact with patient to assess and evaluate symptoms and progress in treatment Medication management  Plan: Supportive approach/coping skills/CBT/mindfulness          Optimize response to the psychotropic agents  Medical Decision Making Problem Points:  Review of psycho-social stressors (1) Data Points:  Review of medication regiment & side effects (2) Review of new medications or change in dosage (2)  I certify that inpatient services furnished can reasonably be expected to improve the patient's condition.   Brittany [name] 11/29/2013, 6:15 PM

## 2013-11-29 NOTE — Progress Notes (Signed)
The focus of this group is to educate the patient on the purpose and policies of crisis stabilization and provide a format to answer questions about their admission.  The group details unit policies and expectations of patients while admitted.  Patient attended 0900 nurse education orientation group this morning.  Patient actively participated, appropriate affect, alert, appropriate insight and engagement.  Today patient will work on 3 goals for discharge.  

## 2013-11-29 NOTE — Progress Notes (Signed)
Adult Psychoeducational Group Note  Date:  11/29/2013 Time:  10:45 PM  Group Topic/Focus:  Wrap-Up Group:   The focus of this group is to help patients review their daily goal of treatment and discuss progress on daily workbooks.  Participation Level:  Active  Participation Quality:  Appropriate and Attentive  Affect:  Appropriate  Cognitive:  Appropriate  Insight: Appropriate  Engagement in Group:  Engaged  Modes of Intervention:  Discussion  Additional Comments:  Pt stated her goal was to control her anxiety. Pt stated she did a good job controlling her anxiety because she colored when she was anxious. Pt stated that journaling also helped with her anxiety. Pt rated her day a nine because she is discharging tomorrow. Pt stated she is ready to go because she feels healthy.   Brittany [name] 11/29/2013, 10:45 PM

## 2013-11-29 NOTE — Progress Notes (Signed)
Recreation Therapy Notes   Animal-Assisted Activity/Therapy (AAA/T) Program Checklist/Progress Notes Patient Eligibility Criteria Checklist & Daily Group note for Rec Tx Intervention  Date: 08.18.2015 Time: 2:45pm Location: [address]    AAA/T Program Assumption of Risk Form signed by Patient/ or Parent Legal Guardian yes  Patient is free of allergies or sever asthma yes  Patient reports no fear of animals yes  Patient reports no history of cruelty to animals yes   Patient understands his/her participation is voluntary yes  Patient washes hands before animal contact yes  Patient washes hands after animal contact yes  Behavioral Response: Appropriate   Education: Hand Washing, Appropriate Animal Interaction   Education Outcome: Acknowledges understanding  Clinical Observations/Feedback: Patient interacted appropriately with therapeutic dog team and peers, additionally patient shared stories about her pets at home.    Laureen Ochs Nishanth Mccaughan, LRT/CTRS  Lane Hacker 11/29/2013 4:26 PM

## 2013-11-29 NOTE — BHH Group Notes (Signed)
[facility] LCSW Group Therapy 11/29/2013 1:15 PM  Type of Therapy: Group Therapy  Participation Level: Active  Participation Quality: Attentive, Sharing and Supportive  Affect: Appropriate  Cognitive: Alert and Oriented  Insight: Developing/Improving and Engaged  Engagement in Therapy: Developing/Improving and Engaged Modes of Intervention: Clarification, Confrontation, Discussion, Education, Exploration, Limit-setting, Orientation, Problem-solving, Rapport Building, Art therapist, Socialization and Support  Summary of Progress/Problems: Patient actively participated in discussion focused on feelings about diagnoses. Patient shared that knowing about her depression, anxiety, and PTSD has helped to validate her experiences and symptoms. She states that it has helped her to understand what she has been feeling for some time. She states that she has engaged a support system for herself to assist her in being socially active (as compared to previous levels of isolation) when she discharges. Patient reports that she feels "mad" at herself for not being honest with herself and others about her symptoms and illness. CSW and other group members offered patient with emotional support.    Tilden Fossa, MSW, [place] Worker [facility] [phone]

## 2013-11-29 NOTE — Tx Team (Signed)
Interdisciplinary Treatment Plan Update   Date Reviewed:  11/29/2013  Time Reviewed:  8:31 AM  Progress in Treatment:   Attending groups: Yes Participating in groups: Yes Taking medication as prescribed: Yes  Tolerating medication: Yes Family/Significant other contact made:  Yes, collateral contact made with family. Patient understands diagnosis: Yes patient understands diagnosis and is seeking help with SI  Discussing patient identified problems/goals with staff: Yes Medical problems stabilized or resolved: Yes Denies suicidal/homicidal ideation: No, but contracts for safety Patient has not harmed self or others: Yes  For review of initial/current patient goals, please see plan of care.  Estimated Length of Stay:  1-3  days  Reasons for Continued Hospitalization:  Anxiety Depression Medication stabilization   New Problems/Goals identified:    Discharge Plan or Barriers:   Home with outpatient follow up to [facility] and [facility]  Additional Comments:  Attendees:  Patient:  11/29/2013 8:31 AM   Signature: Carlton Adam, MD 11/29/2013 8:31 AM  Signature:  Agustina Caroli, NP 11/29/2013 8:31 AM  Signature:  Gwynneth Albright, RN 11/29/2013 8:31 AM  Signature:  Satira Sark, RN 11/29/2013 8:31 AM  Signature:   11/29/2013 8:31 AM  Signature:  Joette Catching, LCSW 11/29/2013 8:31 AM  Signature:  Cyril Mourning Drinkard, LCSW-A 11/29/2013 8:31 AM  Signature:  Lucinda Dell, [facility] Transition Team 11/29/2013 8:31 AM  Signature:  11/29/2013 8:31 AM  Signature:  11/29/2013  8:31 AM  Signature:   Roma Schanz, RN [facility] 11/29/2013  8:31 AM  Signature: 11/29/2013  8:31 AM    Scribe for Treatment Team:   Joette Catching,  11/29/2013 8:31 AM

## 2013-11-29 NOTE — BHH Group Notes (Signed)
Adult Psychoeducational Group Note  Date:  11/29/2013 Time:  12:26 AM  Group Topic/Focus:  Wrap-Up Group:   The focus of this group is to help patients review their daily goal of treatment and discuss progress on daily workbooks.  Participation Level:  Active  Participation Quality:  Appropriate  Affect:  Appropriate  Cognitive:  Appropriate  Insight: Appropriate  Engagement in Group:  Engaged  Modes of Intervention:  Discussion  Additional Comments:  Pt. Sated that her goal was to free her mind. Pt stated that she had a breakthrough and had learned to be honest about her illness to her family. Pt stated that she has a great support system.   Russ Halo 11/29/2013, 12:26 AM

## 2013-11-29 NOTE — BHH Group Notes (Signed)
[facility] LCSW Group Therapy      Feelings About Diagnosis 1:15 - 2:30 PM         11/29/2013    Type of Therapy:  Group Therapy  Participation Level:  Active  Participation Quality:  Appropriate  Affect:  Appropriate  Cognitive:  Alert and Appropriate  Insight:  Developing/Improving and Engaged  Engagement in Therapy:  Developing/Improving and Engaged  Modes of Intervention:  Discussion, Education, Exploration, Problem-Solving, Rapport Building, Support  Summary of Progress/Problems:  Patient actively participated in group. Patient discussed past and present diagnosis and the effects it has had on  life.  Patient talked about family and society being judgmental and the stigma associated with having a mental health diagnosis.  She shared she accepts her diagnosis and is ready to do what is needed to get better.  Concha Pyo 11/29/2013

## 2013-11-29 NOTE — Progress Notes (Signed)
Pt attended spiritual care group on grief and loss facilitated by chaplain Shanequa Whitenight   Group opened with brief discussion and psycho-social ed around grief and loss in relationships and in relation to self - identifying life patterns, circumstances, changes that cause losses. Established group norm of speaking from own life experience. Group goal of establishing open and affirming space for members to share loss and experience with grief, normalize grief experience and provide psycho social education and grief support.     

## 2013-11-29 NOTE — Progress Notes (Signed)
D: Patient in the hallway on approach.  Patient states she had a better day today. Patient states she has been trting to focus on getting better.  Patient denies SI/HI and denies AVH.   A: Staff to monitor Q 15 mins for safety.  Encouragement and support offered.  Scheduled medications administered per orders. R: Patient remains safe on the unit.  Patient taking administered medications.  Patient visible on the unit or medications.

## 2013-11-29 NOTE — Progress Notes (Signed)
Patient in room talking with roommate. Pleasant and cooperative. Not currently voicing any suicidal ideation. States that drawing and talking with roommate have been therapeutic and have kept her mind from going to dark places. Interacting appropriately with staff and peers. Mood is sad. Affect is appropriate to situation. Safety maintained. Libby Maw, RN

## 2013-11-30 DIAGNOSIS — F4310 Post-traumatic stress disorder, unspecified: Secondary | ICD-10-CM

## 2013-11-30 MED ORDER — LAMOTRIGINE 25 MG PO TABS: 50.0000 mg | ORAL_TABLET | Freq: Every day | ORAL | Status: DC

## 2013-11-30 MED ORDER — AMPHETAMINE-DEXTROAMPHET ER 10 MG PO CP24: 10.0000 mg | ORAL_CAPSULE | Freq: Once | ORAL | Status: DC

## 2013-11-30 MED ORDER — ASPIRIN-ACETAMINOPHEN-CAFFEINE 250-250-65 MG PO TABS: 3.0000 | ORAL_TABLET | Freq: Four times a day (QID) | ORAL | Status: DC | PRN

## 2013-11-30 MED ORDER — OXCARBAZEPINE 150 MG PO TABS
150.0000 mg | ORAL_TABLET | Freq: Two times a day (BID) | ORAL | Status: DC
Start: 2013-11-30 — End: 2014-01-27

## 2013-11-30 MED ORDER — CLONAZEPAM 0.5 MG PO TABS: 0.5000 mg | ORAL_TABLET | Freq: Two times a day (BID) | ORAL | Status: DC

## 2013-11-30 MED ORDER — CALCIUM-PHOSPHORUS-VITAMIN D 250-107-500 MG-MG-UNIT PO CHEW: 4.0000 | CHEWABLE_TABLET | Freq: Every day | ORAL | Status: DC

## 2013-11-30 MED ORDER — TRAZODONE HCL 50 MG PO TABS: 50.0000 mg | ORAL_TABLET | Freq: Every day | ORAL | Status: DC

## 2013-11-30 MED ORDER — ACETAMINOPHEN 500 MG PO TABS: 500.0000 mg | ORAL_TABLET | Freq: Four times a day (QID) | ORAL | Status: DC | PRN

## 2013-11-30 MED ORDER — ESTRADIOL 0.1 MG/24HR TD PTTW: 0.5000 | MEDICATED_PATCH | TRANSDERMAL | Status: DC

## 2013-11-30 MED ORDER — DULOXETINE HCL 30 MG PO CPEP: 30.0000 mg | ORAL_CAPSULE | Freq: Every day | ORAL | Status: DC

## 2013-11-30 MED ORDER — VITAMIN B-12 500 MCG PO TABS: 500.0000 ug | ORAL_TABLET | Freq: Every day | ORAL | Status: DC

## 2013-11-30 MED ORDER — ADULT MULTIVITAMIN W/MINERALS CH: 1.0000 | ORAL_TABLET | Freq: Every day | ORAL | Status: DC
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
## 2013-11-30 NOTE — Progress Notes (Signed)
[facility] Adult Case Management Discharge Plan :  Will you be returning to the same living situation after discharge: Yes,  Patient is returning to her home. At discharge, do you have transportation home?:Yes,  Patient will arrange transporation home. Do you have the ability to pay for your medications:Yes,  Patient is able to obtain medications.  Release of information consent forms completed and in the chart;  Patient's signature needed at discharge.  Patient to Follow up at: Follow-up Information   Follow up with [facility]. (Thursday, December 01, 2013 at 11:30 AM)    Contact information:   [address] [place], [place]   [address]  [phone]      Follow up with Dr. Adele Schilder On 01/02/2014. (You are scheduled with Dr. Adele Schilder at [facility] on Monday January 02, 2014. Marland Kitchen  Please arrive at 12:45 with completed registration form)    Contact information:   [place], [place]   [address]  [phone]      Patient denies SI/HI: Patient no longer endorsing SI/HI or other thoughts of self harm.  Safety Planning and Suicide Prevention discussed:.Reviewed with all patients during discharge planning group   Sallee Hogrefe, Eulas Post 11/30/2013, 10:01 AM

## 2013-11-30 NOTE — BHH Group Notes (Signed)
[name] LCSW Group Therapy  Emotional Regulation 1:15 - 2: 30 PM        11/30/2013     Type of Therapy:  Group Therapy  Participation Level:  Appropriate  Participation Quality:  Appropriate  Affect:  Appropriate  Cognitive:  Attentive Appropriate  Insight:  Developing/Improving Engaged  Engagement in Therapy:  Developing/Improving Engaged  Modes of Intervention:  Discussion Exploration Problem-Solving Supportive  Summary of Progress/Problems:  Group topic was emotional regulations.  Patient participated in the discussion and was able to identify an emotion that needed to regulated. Patient shared prior to admitting to the hospital, she would become anger and isolate herself.  Patient was able to identify approprite coping skills including follow up on situation once she has calmed down.Brittany [name] 11/30/2013

## 2013-11-30 NOTE — Progress Notes (Signed)
D: Patient rates depression and anxiety as 2 on scale of 0 to 10. Patient denies pain, SI, HI, and auditory or visual hallucinations. On her patient self-inventory, she indicated that she desires to work on "transitioing to my regular daily life. I have insurance calls to make. I want to see my daughters, dogs, and [place]."A: Support provided through active listening. Medications administered per order. Safety maintained via q 15 minute checks. R:  Patient stated, "I feel ready to go home. I feel like I have made major progress with my mental health." Patient agrees to seek out staff if feeling unsafe.

## 2013-11-30 NOTE — BHH Group Notes (Signed)
[facility] LCSW Aftercare Discharge Planning Group Note   11/30/2013 10:03 AM    Participation Quality:  Appropraite  Mood/Affect:  Appropriate  Depression Rating:  1  Anxiety Rating:  2  Thoughts of Suicide:  No  Will you contract for safety?   NA  Current AVH:  No  Plan for Discharge/Comments:  Patient attended discharge planning group and actively participated in group.  She reports being great and ready to discharge home.  She will follow up with [facility] and Margarita Sermons for outpatient services. CSW provided all participants with daily workbook.   Transportation Means: Patient has transportation.   Supports:  Patient has a support system.   Suhayla Chisom, Eulas Post

## 2013-11-30 NOTE — Progress Notes (Signed)
Patient ID: Brittany [name], female   DOB: [birth year], 48 y.o.   MRN: [id number] She has been discharged home and was picked up by [facility]. She voiced understanding of discharge instruction and of the follow up plan. She stated that she was glad that she came here that she feels the best she has in over a year and a half.She denies SI and all her belongings were taken home with her.
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
## 2013-12-05 NOTE — Progress Notes (Signed)
Patient Discharge Instructions:  After Visit Summary (AVS):   Faxed to:  12/05/13 Discharge Summary Note:   Faxed to:  12/05/13 Psychiatric Admission Assessment Note:   Faxed to:  12/05/13 Suicide Risk Assessment - Discharge Assessment:   Faxed to:  12/05/13 Faxed/Sent to the Next Level Care provider:  12/05/13 Next Level Care Provider Has Access to the EMR, 12/05/13 Faxed to [facility] @ [phone] Records provided to [facility] via [facility]/Epic access.  Patsey Berthold, 12/05/2013, 2:55 PM

## 2013-12-06 ENCOUNTER — Other Ambulatory Visit (HOSPITAL_COMMUNITY): Payer: 59 | Attending: Psychiatry | Admitting: Psychiatry

## 2013-12-06 ENCOUNTER — Encounter (HOSPITAL_COMMUNITY): Payer: Self-pay

## 2013-12-06 DIAGNOSIS — F4310 Post-traumatic stress disorder, unspecified: Secondary | ICD-10-CM

## 2013-12-06 DIAGNOSIS — K219 Gastro-esophageal reflux disease without esophagitis: Secondary | ICD-10-CM | POA: Diagnosis not present

## 2013-12-06 DIAGNOSIS — E7800 Pure hypercholesterolemia, unspecified: Secondary | ICD-10-CM | POA: Diagnosis not present

## 2013-12-06 DIAGNOSIS — F332 Major depressive disorder, recurrent severe without psychotic features: Secondary | ICD-10-CM | POA: Diagnosis not present

## 2013-12-06 DIAGNOSIS — M129 Arthropathy, unspecified: Secondary | ICD-10-CM | POA: Insufficient documentation

## 2013-12-06 DIAGNOSIS — F411 Generalized anxiety disorder: Secondary | ICD-10-CM | POA: Diagnosis not present

## 2013-12-06 DIAGNOSIS — G4730 Sleep apnea, unspecified: Secondary | ICD-10-CM | POA: Insufficient documentation

## 2013-12-06 DIAGNOSIS — I10 Essential (primary) hypertension: Secondary | ICD-10-CM | POA: Diagnosis not present

## 2013-12-06 DIAGNOSIS — F331 Major depressive disorder, recurrent, moderate: Secondary | ICD-10-CM

## 2013-12-06 MED ORDER — CLONAZEPAM 1 MG PO TABS: 1.0000 mg | ORAL_TABLET | Freq: Every day | ORAL | Status: DC

## 2013-12-06 NOTE — Progress Notes (Signed)
    Daily Group Progress Note  Program: IOP  Group Time: 9:00-10:30  Participation Level: Active  Behavioral Response: Appropriate  Type of Therapy:  Group Therapy  Summary of Progress: Pt. Met with case manager and psychiatrist.     Group Time: 10:30-12:00  Participation Level:  Active  Behavioral Response: Appropriate  Type of Therapy: Psycho-education Group  Summary of Progress: Pt. Met with case manager and psychiatrist.  [facility]
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
## 2013-12-07 ENCOUNTER — Encounter (HOSPITAL_COMMUNITY): Payer: Self-pay | Admitting: Psychiatry

## 2013-12-07 ENCOUNTER — Other Ambulatory Visit (HOSPITAL_COMMUNITY): Payer: 59 | Admitting: Psychiatry

## 2013-12-07 DIAGNOSIS — F332 Major depressive disorder, recurrent severe without psychotic features: Secondary | ICD-10-CM | POA: Diagnosis not present

## 2013-12-07 DIAGNOSIS — F4310 Post-traumatic stress disorder, unspecified: Secondary | ICD-10-CM

## 2013-12-07 DIAGNOSIS — F331 Major depressive disorder, recurrent, moderate: Secondary | ICD-10-CM

## 2013-12-07 NOTE — Progress Notes (Signed)
    Daily Group Progress Note  Program: IOP  Group Time: 9:00-10:00  Participation Level: Active  Behavioral Response: Appropriate  Type of Therapy:  Group Therapy  Summary of Progress: Pt. Reported that she was feeling good, but was feeling anxious about starting group. Pt. Shared that she is learning to accept herself and letting go of expectations for herself while in recovery such as setting unreasonable standards for housework and cleaning.     Group Time: 10:00-11:00  Participation Level:  Active  Behavioral Response: Appropriate  Type of Therapy: Psycho-education Group  Summary of Progress: Pt. Participated in discussion about how to develop a meditation practice and participated in meditation exercise.  Nancie Neas, COUNS

## 2013-12-08 ENCOUNTER — Other Ambulatory Visit (HOSPITAL_COMMUNITY): Payer: 59 | Admitting: Psychiatry

## 2013-12-08 DIAGNOSIS — F332 Major depressive disorder, recurrent severe without psychotic features: Secondary | ICD-10-CM | POA: Diagnosis not present

## 2013-12-08 DIAGNOSIS — F331 Major depressive disorder, recurrent, moderate: Secondary | ICD-10-CM

## 2013-12-08 DIAGNOSIS — F4310 Post-traumatic stress disorder, unspecified: Secondary | ICD-10-CM

## 2013-12-09 ENCOUNTER — Encounter (HOSPITAL_COMMUNITY): Payer: Self-pay | Admitting: Psychiatry

## 2013-12-09 ENCOUNTER — Other Ambulatory Visit (HOSPITAL_COMMUNITY): Payer: 59 | Admitting: Psychiatry

## 2013-12-09 DIAGNOSIS — F4310 Post-traumatic stress disorder, unspecified: Secondary | ICD-10-CM

## 2013-12-09 DIAGNOSIS — F332 Major depressive disorder, recurrent severe without psychotic features: Secondary | ICD-10-CM | POA: Diagnosis not present

## 2013-12-09 DIAGNOSIS — F331 Major depressive disorder, recurrent, moderate: Secondary | ICD-10-CM

## 2013-12-09 NOTE — Progress Notes (Signed)
Patient ID: Brittany [name], female   DOB: [birth year], 48 y.o.   MRN: [id number] Patient seen with [name] today, states that she is beginning to see the night with no nightmares. Still has occasional flashback Still anxious energy is poor mood is anxious and depressed but denies suicidal or homicidal ideation. No hallucinations or delusions. Tolerating her medications well states the group is helping her cope better. Encourage patient to continue watching,, comedies and do relaxing reading.

## 2013-12-09 NOTE — Progress Notes (Signed)
    Daily Group Progress Note  Program: IOP  Group Time: 9:00-10:30  Participation Level: Active  Behavioral Response: Appropriate  Type of Therapy:  Group Therapy  Summary of Progress: Pt. Reported that she was feeling "alright, medium". Pt. Smiled and laughed appropriately, and made appropriate eye contact. Pt. Participated in discussion about danger of comparison of self to others.     Group Time: 10:30-12:00  Participation Level:  Active  Behavioral Response: Appropriate  Type of Therapy: Psycho-education Group  Summary of Progress: Pt. Participated in discussion about Junius Creamer video and development of authentic self.  Nancie Neas, COUNS
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
## 2013-12-12 ENCOUNTER — Other Ambulatory Visit (HOSPITAL_COMMUNITY): Payer: 59 | Admitting: Psychiatry

## 2013-12-12 DIAGNOSIS — F332 Major depressive disorder, recurrent severe without psychotic features: Secondary | ICD-10-CM | POA: Diagnosis not present

## 2013-12-12 DIAGNOSIS — F4310 Post-traumatic stress disorder, unspecified: Secondary | ICD-10-CM

## 2013-12-12 DIAGNOSIS — F331 Major depressive disorder, recurrent, moderate: Secondary | ICD-10-CM

## 2013-12-12 NOTE — Progress Notes (Signed)
    Daily Group Progress Note  Program: IOP  Group Time: 9:00-10:30  Participation Level: Active  Behavioral Response: Appropriate  Type of Therapy:  Group Therapy  Summary of Progress: Pt. Reported that she was doing "alright" and had a productive weekend doing yardwork and met with a childhood friend and discussed issues related to childhood trauma. Pt. Reported that her depression was bad on Sunday which she attributed to processing issues with her friend. Pt. Reported that she did not isolate and was able to go out with her husband.     Group Time: 10:30-12:00  Participation Level:  Active  Behavioral Response: Appropriate  Type of Therapy: Psycho-education Group  Summary of Progress: Pt. Participated in grief and loss facilitated by Jeanella Craze.  Nancie Neas, COUNS

## 2013-12-13 ENCOUNTER — Encounter (HOSPITAL_COMMUNITY): Payer: Self-pay | Admitting: Psychiatry

## 2013-12-13 ENCOUNTER — Other Ambulatory Visit (HOSPITAL_COMMUNITY): Payer: 59 | Attending: Psychiatry | Admitting: Psychiatry

## 2013-12-13 DIAGNOSIS — G4730 Sleep apnea, unspecified: Secondary | ICD-10-CM | POA: Diagnosis not present

## 2013-12-13 DIAGNOSIS — F411 Generalized anxiety disorder: Secondary | ICD-10-CM | POA: Diagnosis not present

## 2013-12-13 DIAGNOSIS — F4310 Post-traumatic stress disorder, unspecified: Secondary | ICD-10-CM | POA: Diagnosis not present

## 2013-12-13 DIAGNOSIS — Z91199 Patient's noncompliance with other medical treatment and regimen due to unspecified reason: Secondary | ICD-10-CM | POA: Diagnosis not present

## 2013-12-13 DIAGNOSIS — G4700 Insomnia, unspecified: Secondary | ICD-10-CM | POA: Diagnosis not present

## 2013-12-13 DIAGNOSIS — F332 Major depressive disorder, recurrent severe without psychotic features: Secondary | ICD-10-CM | POA: Insufficient documentation

## 2013-12-13 DIAGNOSIS — Z9119 Patient's noncompliance with other medical treatment and regimen: Secondary | ICD-10-CM | POA: Diagnosis not present

## 2013-12-13 DIAGNOSIS — E7800 Pure hypercholesterolemia, unspecified: Secondary | ICD-10-CM | POA: Diagnosis not present

## 2013-12-13 DIAGNOSIS — IMO0002 Reserved for concepts with insufficient information to code with codable children: Secondary | ICD-10-CM | POA: Diagnosis not present

## 2013-12-13 DIAGNOSIS — I10 Essential (primary) hypertension: Secondary | ICD-10-CM | POA: Insufficient documentation

## 2013-12-13 DIAGNOSIS — K219 Gastro-esophageal reflux disease without esophagitis: Secondary | ICD-10-CM | POA: Diagnosis not present

## 2013-12-13 DIAGNOSIS — Z609 Problem related to social environment, unspecified: Secondary | ICD-10-CM | POA: Diagnosis not present
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
## 2013-12-13 NOTE — Progress Notes (Signed)
    Daily Group Progress Note  Program: IOP  Group Time: 9:00-10:30  Participation Level: Minimal  Behavioral Response: Appropriate  Type of Therapy:  Group Therapy  Summary of Progress: Pt. Reported that she was not having a good day. Pt. Reported that her sleep was poor and that it was interrupted throughout the night. Pt. Reported that she has been coloring mandalas as a calming and stress reduction activity. Pt discussed her bariatric surgery as a factor that has complicated her depression recovery and learning to understand her food addiction.     Group Time: 10:30-12:00  Participation Level:  Minimal  Behavioral Response: Appropriate  Type of Therapy: Psycho-education Group  Summary of Progress: Pt. Participated in discussion about developing resistance to shame through vulnerability. Pt. Watched and discussed Almond Lint video.  Nancie Neas, COUNS

## 2013-12-14 ENCOUNTER — Encounter (HOSPITAL_COMMUNITY): Payer: Self-pay | Admitting: Psychiatry

## 2013-12-14 ENCOUNTER — Other Ambulatory Visit (HOSPITAL_COMMUNITY): Payer: 59 | Admitting: Psychiatry

## 2013-12-14 DIAGNOSIS — F331 Major depressive disorder, recurrent, moderate: Secondary | ICD-10-CM

## 2013-12-14 DIAGNOSIS — F4310 Post-traumatic stress disorder, unspecified: Secondary | ICD-10-CM

## 2013-12-14 DIAGNOSIS — F332 Major depressive disorder, recurrent severe without psychotic features: Secondary | ICD-10-CM | POA: Diagnosis not present
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
## 2013-12-15 ENCOUNTER — Other Ambulatory Visit (HOSPITAL_COMMUNITY): Payer: 59 | Admitting: Psychiatry

## 2013-12-15 ENCOUNTER — Encounter (HOSPITAL_COMMUNITY): Payer: Self-pay | Admitting: Psychiatry

## 2013-12-15 DIAGNOSIS — F331 Major depressive disorder, recurrent, moderate: Secondary | ICD-10-CM

## 2013-12-15 DIAGNOSIS — F4310 Post-traumatic stress disorder, unspecified: Secondary | ICD-10-CM

## 2013-12-15 DIAGNOSIS — F332 Major depressive disorder, recurrent severe without psychotic features: Secondary | ICD-10-CM | POA: Diagnosis not present

## 2013-12-15 NOTE — Progress Notes (Signed)
    Daily Group Progress Note  Program: IOP  Group Time: 9:00-10:30  Participation Level: Active  Behavioral Response: Appropriate  Type of Therapy:  Group Therapy  Summary of Progress: Pt. Reported that she was doing "good" and feeling much better than she had felt in the last couple of days. Pt. Participated in discussion about burden living with the perception of having an audience, the need to "stay in the lines", feeling judgement from others. Pt. Reported that if she were free of this burden she would feel freer to voice her opinion without fear of what others thought of her.     Group Time: 10:30-12:00  Participation Level:  Active  Behavioral Response: Appropriate  Type of Therapy: Psycho-education Group  Summary of Progress: Pt. Participated in discussion about "just for today..." learning to live in the moment with an attitude of acceptance.  Nancie Neas, COUNS

## 2013-12-16 ENCOUNTER — Other Ambulatory Visit (HOSPITAL_COMMUNITY): Payer: 59 | Admitting: Psychiatry

## 2013-12-16 ENCOUNTER — Encounter (HOSPITAL_COMMUNITY): Payer: Self-pay | Admitting: Psychiatry

## 2013-12-16 DIAGNOSIS — F332 Major depressive disorder, recurrent severe without psychotic features: Secondary | ICD-10-CM | POA: Diagnosis not present

## 2013-12-16 DIAGNOSIS — F4310 Post-traumatic stress disorder, unspecified: Secondary | ICD-10-CM

## 2013-12-16 DIAGNOSIS — F331 Major depressive disorder, recurrent, moderate: Secondary | ICD-10-CM

## 2013-12-16 NOTE — Progress Notes (Signed)
Patient ID: Brittany [name], female   DOB: [birth year], 48 y.o.   MRN: [id number] Patient was seen today, states she is doing better than her last session with me. Patient has been working very diligently on her homework writing down I forgive myself. States her sleep has improved and she is able to rest well, appetite tends to fluctuate and she is focusing on herself and taking more self time. Reports no crying spells and no anxiety. Patient is going up to [place] to see her mother-in-law and his concern that mother-in-law will ask her questions about her IOP stay and she is somebody that this will induce flashbacks. Discussed with her that she needed to tell her husband that he needs to tell his mother not ask her questions she felt relieved at this and is willing to do so. Patient denies suicidal or homicidal ideation has no hallucinations or delusions and is tolerating her medications well.

## 2013-12-16 NOTE — Progress Notes (Signed)
    Daily Group Progress Note  Program: IOP  Group Time: 9:00-10:30  Participation Level: Active  Behavioral Response: Appropriate  Type of Therapy:  Group Therapy  Summary of Progress: Pt. Reported that she was feeling "alright" and her depression was much better than earlier in the week. Pt. Discussed challenge of facing questions from family members about her mental health crisis.     Group Time: 10:30-12:00  Participation Level:  Active  Behavioral Response: Appropriate  Type of Therapy: Psycho-education Group  Summary of Progress: Pt. Participated in discussion about developing motivation. Pt. Watched and discussed Mel Robbins video.  Nancie Neas, COUNS

## 2013-12-20 ENCOUNTER — Encounter (HOSPITAL_COMMUNITY): Payer: Self-pay | Admitting: Psychiatry

## 2013-12-20 ENCOUNTER — Other Ambulatory Visit (HOSPITAL_COMMUNITY): Payer: 59 | Admitting: Psychiatry

## 2013-12-20 DIAGNOSIS — F332 Major depressive disorder, recurrent severe without psychotic features: Secondary | ICD-10-CM | POA: Diagnosis not present

## 2013-12-20 DIAGNOSIS — F4310 Post-traumatic stress disorder, unspecified: Secondary | ICD-10-CM

## 2013-12-20 DIAGNOSIS — F331 Major depressive disorder, recurrent, moderate: Secondary | ICD-10-CM

## 2013-12-20 NOTE — Progress Notes (Signed)
Patient ID: Brittany [name], female   DOB: 1965/08/28, 48 y.o.   MRN: [id number] Patient seen today, states that she visited her mother-in-law in [place] over the long weekend and the visit went very well. Patient initially had been nervous about that but states that it went better than she anticipated. Mood is better today he per the night and her appetite is good. Patient is gradually working on positive coping skills and thought blocking techniques. Patient was complimented on her efforts. Denies suicidal or homicidal ideation and has no hallucinations or delusions. She's tolerating her medications well.

## 2013-12-20 NOTE — Progress Notes (Signed)
    Daily Group Progress Note  Program: IOP  Group Time: 9:00-10:30  Participation Level: Active  Behavioral Response: Appropriate  Type of Therapy:  Group Therapy  Summary of Progress: Pt. Reported that she was doing "fine". Pt. Reported that she went to [place] for the long weekend and felt that she received the support that she needed from her husband. Pt. Continues to work on issues of shame and forgiveness of self.     Group Time: 10:30-12:00  Participation Level:  Active  Behavioral Response: Appropriate  Type of Therapy: Psycho-education Group  Summary of Progress: Pt. Participated in guided visualization activity.  Nancie Neas, COUNS

## 2013-12-21 ENCOUNTER — Encounter (HOSPITAL_COMMUNITY): Payer: Self-pay | Admitting: Psychiatry

## 2013-12-21 ENCOUNTER — Other Ambulatory Visit (HOSPITAL_COMMUNITY): Payer: 59 | Admitting: Psychiatry

## 2013-12-21 DIAGNOSIS — F332 Major depressive disorder, recurrent severe without psychotic features: Secondary | ICD-10-CM | POA: Diagnosis not present
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
## 2013-12-22 ENCOUNTER — Other Ambulatory Visit (HOSPITAL_COMMUNITY): Payer: 59 | Admitting: Psychiatry

## 2013-12-23 ENCOUNTER — Telehealth (HOSPITAL_COMMUNITY): Payer: Self-pay | Admitting: Psychiatry

## 2013-12-23 ENCOUNTER — Other Ambulatory Visit (HOSPITAL_COMMUNITY): Payer: 59

## 2013-12-26 ENCOUNTER — Other Ambulatory Visit (HOSPITAL_COMMUNITY): Payer: 59 | Admitting: Psychiatry

## 2013-12-27 ENCOUNTER — Other Ambulatory Visit (HOSPITAL_COMMUNITY): Payer: 59 | Admitting: Psychiatry

## 2013-12-28 ENCOUNTER — Other Ambulatory Visit (HOSPITAL_COMMUNITY): Payer: 59

## 2013-12-29 ENCOUNTER — Other Ambulatory Visit (HOSPITAL_COMMUNITY): Payer: 59

## 2013-12-29 NOTE — Patient Instructions (Signed)
Discharge today.  Follow up with Dr. Adele Schilder & Jamey Ripa, LCSW (EAP).  Encouraged support groups.  Tentatively RTW on 01-09-14.
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
## 2013-12-30 ENCOUNTER — Other Ambulatory Visit (HOSPITAL_COMMUNITY): Payer: 59 | Admitting: Psychiatry

## 2014-01-01 ENCOUNTER — Encounter (HOSPITAL_COMMUNITY): Payer: 59 | Admitting: Anesthesiology

## 2014-01-01 ENCOUNTER — Emergency Department (HOSPITAL_COMMUNITY): Payer: 59 | Admitting: Anesthesiology

## 2014-01-01 ENCOUNTER — Inpatient Hospital Stay (HOSPITAL_COMMUNITY)
Admission: EM | Admit: 2014-01-01 | Discharge: 2014-01-09 | DRG: 857 | Disposition: A | Discharge status: Home Health | Payer: 59 | Attending: General Surgery | Admitting: General Surgery

## 2014-01-01 ENCOUNTER — Encounter (HOSPITAL_COMMUNITY): Payer: Self-pay | Admitting: Emergency Medicine

## 2014-01-01 ENCOUNTER — Encounter (HOSPITAL_COMMUNITY): Admission: EM | Disposition: A | Discharge status: Home Health | Payer: Self-pay | Source: Home / Self Care

## 2014-01-01 ENCOUNTER — Emergency Department (HOSPITAL_COMMUNITY): Payer: 59

## 2014-01-01 DIAGNOSIS — T814XXA Infection following a procedure, initial encounter: Secondary | ICD-10-CM

## 2014-01-01 DIAGNOSIS — D649 Anemia, unspecified: Secondary | ICD-10-CM

## 2014-01-01 DIAGNOSIS — D473 Essential (hemorrhagic) thrombocythemia: Secondary | ICD-10-CM | POA: Diagnosis present

## 2014-01-01 DIAGNOSIS — E7800 Pure hypercholesterolemia, unspecified: Secondary | ICD-10-CM | POA: Diagnosis present

## 2014-01-01 DIAGNOSIS — Z87891 Personal history of nicotine dependence: Secondary | ICD-10-CM | POA: Diagnosis not present

## 2014-01-01 DIAGNOSIS — Z9884 Bariatric surgery status: Secondary | ICD-10-CM | POA: Diagnosis not present

## 2014-01-01 DIAGNOSIS — R21 Rash and other nonspecific skin eruption: Secondary | ICD-10-CM

## 2014-01-01 DIAGNOSIS — G4733 Obstructive sleep apnea (adult) (pediatric): Secondary | ICD-10-CM | POA: Diagnosis present

## 2014-01-01 DIAGNOSIS — K219 Gastro-esophageal reflux disease without esophagitis: Secondary | ICD-10-CM | POA: Diagnosis present

## 2014-01-01 DIAGNOSIS — T8140XA Infection following a procedure, unspecified, initial encounter: Principal | ICD-10-CM | POA: Diagnosis present

## 2014-01-01 DIAGNOSIS — Z9049 Acquired absence of other specified parts of digestive tract: Secondary | ICD-10-CM

## 2014-01-01 DIAGNOSIS — M1990 Unspecified osteoarthritis, unspecified site: Secondary | ICD-10-CM | POA: Diagnosis present

## 2014-01-01 DIAGNOSIS — K449 Diaphragmatic hernia without obstruction or gangrene: Secondary | ICD-10-CM | POA: Diagnosis present

## 2014-01-01 DIAGNOSIS — T370X5A Adverse effect of sulfonamides, initial encounter: Secondary | ICD-10-CM | POA: Diagnosis present

## 2014-01-01 DIAGNOSIS — Y838 Other surgical procedures as the cause of abnormal reaction of the patient, or of later complication, without mention of misadventure at the time of the procedure: Secondary | ICD-10-CM | POA: Diagnosis present

## 2014-01-01 DIAGNOSIS — IMO0001 Reserved for inherently not codable concepts without codable children: Secondary | ICD-10-CM

## 2014-01-01 DIAGNOSIS — I10 Essential (primary) hypertension: Secondary | ICD-10-CM | POA: Diagnosis present

## 2014-01-01 DIAGNOSIS — F4310 Post-traumatic stress disorder, unspecified: Secondary | ICD-10-CM

## 2014-01-01 DIAGNOSIS — T814XXS Infection following a procedure, sequela: Secondary | ICD-10-CM

## 2014-01-01 DIAGNOSIS — R109 Unspecified abdominal pain: Secondary | ICD-10-CM | POA: Diagnosis present

## 2014-01-01 DIAGNOSIS — L270 Generalized skin eruption due to drugs and medicaments taken internally: Secondary | ICD-10-CM | POA: Diagnosis present

## 2014-01-01 DIAGNOSIS — F332 Major depressive disorder, recurrent severe without psychotic features: Secondary | ICD-10-CM | POA: Diagnosis present

## 2014-01-01 DIAGNOSIS — IMO0002 Reserved for concepts with insufficient information to code with codable children: Secondary | ICD-10-CM | POA: Diagnosis present

## 2014-01-01 DIAGNOSIS — G43909 Migraine, unspecified, not intractable, without status migrainosus: Secondary | ICD-10-CM | POA: Diagnosis present

## 2014-01-01 DIAGNOSIS — E785 Hyperlipidemia, unspecified: Secondary | ICD-10-CM | POA: Diagnosis present

## 2014-01-01 DIAGNOSIS — L02219 Cutaneous abscess of trunk, unspecified: Secondary | ICD-10-CM | POA: Diagnosis present

## 2014-01-01 DIAGNOSIS — T3795XA Adverse effect of unspecified systemic anti-infective and antiparasitic, initial encounter: Secondary | ICD-10-CM

## 2014-01-01 DIAGNOSIS — L03319 Cellulitis of trunk, unspecified: Secondary | ICD-10-CM

## 2014-01-01 DIAGNOSIS — T8149XA Infection following a procedure, other surgical site, initial encounter: Secondary | ICD-10-CM | POA: Diagnosis present

## 2014-01-01 HISTORY — PX: IRRIGATION AND DEBRIDEMENT ABSCESS: SHX5252

## 2014-01-01 LAB — CBC WITH DIFFERENTIAL/PLATELET
BASOS ABS: 0 10*3/uL (ref 0.0–0.1)
Basophils Relative: 0 % (ref 0–1)
EOS ABS: 0 10*3/uL (ref 0.0–0.7)
Eosinophils Relative: 0 % (ref 0–5)
HEMATOCRIT: 32.5 % — AB (ref 36.0–46.0)
Hemoglobin: 11.3 g/dL — ABNORMAL LOW (ref 12.0–15.0)
Lymphocytes Relative: 4 % — ABNORMAL LOW (ref 12–46)
Lymphs Abs: 0.5 10*3/uL — ABNORMAL LOW (ref 0.7–4.0)
MCH: 32.4 pg (ref 26.0–34.0)
MCHC: 34.8 g/dL (ref 30.0–36.0)
MCV: 93.1 fL (ref 78.0–100.0)
MONO ABS: 0.6 10*3/uL (ref 0.1–1.0)
Monocytes Relative: 5 % (ref 3–12)
Neutro Abs: 11.3 10*3/uL — ABNORMAL HIGH (ref 1.7–7.7)
Neutrophils Relative %: 91 % — ABNORMAL HIGH (ref 43–77)
Platelets: 410 10*3/uL — ABNORMAL HIGH (ref 150–400)
RBC: 3.49 MIL/uL — ABNORMAL LOW (ref 3.87–5.11)
RDW: 12.4 % (ref 11.5–15.5)
WBC: 12.4 10*3/uL — ABNORMAL HIGH (ref 4.0–10.5)

## 2014-01-01 LAB — COMPREHENSIVE METABOLIC PANEL
ALT: 29 U/L (ref 0–35)
AST: 21 U/L (ref 0–37)
Albumin: 2.7 g/dL — ABNORMAL LOW (ref 3.5–5.2)
Alkaline Phosphatase: 123 U/L — ABNORMAL HIGH (ref 39–117)
Anion gap: 13 (ref 5–15)
BUN: 11 mg/dL (ref 6–23)
CO2: 29 mEq/L (ref 19–32)
CREATININE: 0.6 mg/dL (ref 0.50–1.10)
Calcium: 8.9 mg/dL (ref 8.4–10.5)
Chloride: 94 mEq/L — ABNORMAL LOW (ref 96–112)
GFR calc Af Amer: >90 mL/min (ref >90)
GFR calc non Af Amer: >90 mL/min (ref >90)
GLUCOSE: 111 mg/dL — AB (ref 70–99)
Potassium: 3.8 mEq/L (ref 3.7–5.3)
SODIUM: 136 meq/L — AB (ref 137–147)
TOTAL PROTEIN: 7.2 g/dL (ref 6.0–8.3)
Total Bilirubin: 0.3 mg/dL (ref 0.3–1.2)

## 2014-01-01 SURGERY — IRRIGATION AND DEBRIDEMENT ABSCESS
Anesthesia: General | Site: Abdomen

## 2014-01-01 MED ORDER — FENTANYL CITRATE 0.05 MG/ML IJ SOLN
25.0000 ug | INTRAMUSCULAR | Status: DC | PRN
Start: 2014-01-01 — End: 2014-01-01

## 2014-01-01 MED ORDER — TRAZODONE HCL 50 MG PO TABS
50.0000 mg | ORAL_TABLET | Freq: Every day | ORAL | Status: DC
  Administered 2014-01-01 – 2014-01-08 (×8): 50 mg via ORAL
  Filled 2014-01-01 (×9): qty 1

## 2014-01-01 MED ORDER — 0.9 % SODIUM CHLORIDE (POUR BTL) OPTIME
TOPICAL | Status: DC | PRN
  Administered 2014-01-01: 5000 mL

## 2014-01-01 MED ORDER — SODIUM CHLORIDE 0.9 % IV SOLN
Freq: Once | INTRAVENOUS | Status: AC
  Administered 2014-01-01: 15:00:00 via INTRAVENOUS

## 2014-01-01 MED ORDER — SUCCINYLCHOLINE CHLORIDE 20 MG/ML IJ SOLN
INTRAMUSCULAR | Status: DC | PRN
  Administered 2014-01-01: 100 mg via INTRAVENOUS

## 2014-01-01 MED ORDER — KCL IN DEXTROSE-NACL 20-5-0.9 MEQ/L-%-% IV SOLN
INTRAVENOUS | Status: AC
  Filled 2014-01-01: qty 1000

## 2014-01-01 MED ORDER — LORATADINE 10 MG PO TABS
10.0000 mg | ORAL_TABLET | Freq: Every day | ORAL | Status: DC
  Administered 2014-01-02 – 2014-01-09 (×5): 10 mg via ORAL
  Filled 2014-01-01 (×8): qty 1

## 2014-01-01 MED ORDER — DIPHENHYDRAMINE HCL 50 MG/ML IJ SOLN
25.0000 mg | Freq: Once | INTRAMUSCULAR | Status: AC
  Administered 2014-01-01: 25 mg via INTRAVENOUS
  Filled 2014-01-01: qty 1

## 2014-01-01 MED ORDER — DIPHENHYDRAMINE HCL 50 MG/ML IJ SOLN
INTRAMUSCULAR | Status: AC
  Filled 2014-01-01: qty 1

## 2014-01-01 MED ORDER — PSEUDOEPHEDRINE HCL ER 120 MG PO TB12
120.0000 mg | ORAL_TABLET | Freq: Two times a day (BID) | ORAL | Status: DC
  Administered 2014-01-03 – 2014-01-09 (×6): 120 mg via ORAL
  Filled 2014-01-01 (×17): qty 1

## 2014-01-01 MED ORDER — PROPOFOL 10 MG/ML IV BOLUS
INTRAVENOUS | Status: DC | PRN
  Administered 2014-01-01: 150 mg via INTRAVENOUS

## 2014-01-01 MED ORDER — IOHEXOL 300 MG/ML  SOLN
100.0000 mL | Freq: Once | INTRAMUSCULAR | Status: AC | PRN
  Administered 2014-01-01: 100 mL via INTRAVENOUS

## 2014-01-01 MED ORDER — ONDANSETRON HCL 4 MG PO TABS: 4.0000 mg | ORAL_TABLET | Freq: Four times a day (QID) | ORAL | Status: DC | PRN

## 2014-01-01 MED ORDER — PROMETHAZINE HCL 25 MG/ML IJ SOLN: 6.2500 mg | INTRAMUSCULAR | Status: DC | PRN

## 2014-01-01 MED ORDER — SODIUM CHLORIDE 0.9 % IV BOLUS (SEPSIS)
1000.0000 mL | Freq: Once | INTRAVENOUS | Status: AC
  Administered 2014-01-01: 1000 mL via INTRAVENOUS

## 2014-01-01 MED ORDER — CIPROFLOXACIN IN D5W 400 MG/200ML IV SOLN
400.0000 mg | Freq: Two times a day (BID) | INTRAVENOUS | Status: DC
  Administered 2014-01-02 – 2014-01-04 (×5): 400 mg via INTRAVENOUS
  Filled 2014-01-01 (×5): qty 200

## 2014-01-01 MED ORDER — LACTATED RINGERS IV SOLN
INTRAVENOUS | Status: DC | PRN
  Administered 2014-01-01: 17:00:00 via INTRAVENOUS

## 2014-01-01 MED ORDER — DIPHENHYDRAMINE HCL 25 MG PO CAPS
25.0000 mg | ORAL_CAPSULE | ORAL | Status: DC | PRN
  Administered 2014-01-02 – 2014-01-06 (×7): 25 mg via ORAL
  Filled 2014-01-01 (×8): qty 1

## 2014-01-01 MED ORDER — HYDROMORPHONE HCL 1 MG/ML IJ SOLN
1.0000 mg | Freq: Once | INTRAMUSCULAR | Status: AC
  Administered 2014-01-01: 1 mg via INTRAVENOUS
  Filled 2014-01-01: qty 1

## 2014-01-01 MED ORDER — METRONIDAZOLE IN NACL 5-0.79 MG/ML-% IV SOLN
500.0000 mg | Freq: Three times a day (TID) | INTRAVENOUS | Status: DC
  Administered 2014-01-01 – 2014-01-04 (×8): 500 mg via INTRAVENOUS
  Filled 2014-01-01 (×9): qty 100

## 2014-01-01 MED ORDER — FENTANYL CITRATE 0.05 MG/ML IJ SOLN
INTRAMUSCULAR | Status: AC
  Filled 2014-01-01: qty 5

## 2014-01-01 MED ORDER — ONDANSETRON HCL 4 MG/2ML IJ SOLN: 4.0000 mg | Freq: Four times a day (QID) | INTRAMUSCULAR | Status: DC | PRN

## 2014-01-01 MED ORDER — METRONIDAZOLE IN NACL 5-0.79 MG/ML-% IV SOLN
500.0000 mg | Freq: Once | INTRAVENOUS | Status: AC
  Administered 2014-01-01: 500 mg via INTRAVENOUS
  Filled 2014-01-01: qty 100

## 2014-01-01 MED ORDER — ENOXAPARIN SODIUM 40 MG/0.4ML ~~LOC~~ SOLN
40.0000 mg | SUBCUTANEOUS | Status: DC
  Administered 2014-01-01 – 2014-01-08 (×8): 40 mg via SUBCUTANEOUS
  Filled 2014-01-01 (×9): qty 0.4

## 2014-01-01 MED ORDER — FENTANYL CITRATE 0.05 MG/ML IJ SOLN
INTRAMUSCULAR | Status: DC | PRN
  Administered 2014-01-01: 50 ug via INTRAVENOUS

## 2014-01-01 MED ORDER — DEXAMETHASONE SODIUM PHOSPHATE 10 MG/ML IJ SOLN
INTRAMUSCULAR | Status: DC | PRN
  Administered 2014-01-01: 10 mg via INTRAVENOUS

## 2014-01-01 MED ORDER — MORPHINE SULFATE 2 MG/ML IJ SOLN
2.0000 mg | INTRAMUSCULAR | Status: DC | PRN
  Administered 2014-01-02 – 2014-01-04 (×7): 2 mg via INTRAVENOUS
  Administered 2014-01-06: 4 mg via INTRAVENOUS
  Administered 2014-01-09: 2 mg via INTRAVENOUS
  Filled 2014-01-01 (×8): qty 1
  Filled 2014-01-01: qty 2
  Filled 2014-01-01: qty 1

## 2014-01-01 MED ORDER — OXYCODONE-ACETAMINOPHEN 5-325 MG PO TABS
1.0000 | ORAL_TABLET | ORAL | Status: DC | PRN
  Administered 2014-01-02: 1 via ORAL
  Administered 2014-01-02 – 2014-01-05 (×5): 2 via ORAL
  Administered 2014-01-06 – 2014-01-08 (×3): 1 via ORAL
  Filled 2014-01-01 (×4): qty 2
  Filled 2014-01-01 (×4): qty 1
  Filled 2014-01-01: qty 2
  Filled 2014-01-01: qty 1

## 2014-01-01 MED ORDER — OXCARBAZEPINE 150 MG PO TABS
150.0000 mg | ORAL_TABLET | Freq: Two times a day (BID) | ORAL | Status: DC
  Administered 2014-01-02 – 2014-01-05 (×7): 150 mg via ORAL
  Filled 2014-01-01 (×9): qty 1

## 2014-01-01 MED ORDER — DULOXETINE HCL 30 MG PO CPEP
30.0000 mg | ORAL_CAPSULE | Freq: Every day | ORAL | Status: DC
  Administered 2014-01-02 – 2014-01-09 (×8): 30 mg via ORAL
  Filled 2014-01-01 (×8): qty 1

## 2014-01-01 MED ORDER — KCL IN DEXTROSE-NACL 20-5-0.9 MEQ/L-%-% IV SOLN
INTRAVENOUS | Status: DC
  Administered 2014-01-01 – 2014-01-07 (×7): via INTRAVENOUS
  Administered 2014-01-08: 50 mL/h via INTRAVENOUS
  Filled 2014-01-01 (×12): qty 1000

## 2014-01-01 MED ORDER — ONDANSETRON HCL 4 MG/2ML IJ SOLN
INTRAMUSCULAR | Status: AC
  Filled 2014-01-01: qty 2

## 2014-01-01 MED ORDER — CLONAZEPAM 1 MG PO TABS
1.0000 mg | ORAL_TABLET | Freq: Every day | ORAL | Status: DC
  Administered 2014-01-01 – 2014-01-08 (×8): 1 mg via ORAL
  Filled 2014-01-01 (×8): qty 1

## 2014-01-01 MED ORDER — PROPOFOL 10 MG/ML IV BOLUS
INTRAVENOUS | Status: AC
  Filled 2014-01-01: qty 20

## 2014-01-01 MED ORDER — CIPROFLOXACIN IN D5W 400 MG/200ML IV SOLN
400.0000 mg | Freq: Once | INTRAVENOUS | Status: AC
  Administered 2014-01-01: 400 mg via INTRAVENOUS
  Filled 2014-01-01: qty 200

## 2014-01-01 MED ORDER — DIPHENHYDRAMINE HCL 50 MG/ML IJ SOLN
12.0000 mg | INTRAMUSCULAR | Status: AC
  Administered 2014-01-01: 25 mg via INTRAVENOUS

## 2014-01-01 MED ORDER — VANCOMYCIN HCL IN DEXTROSE 1-5 GM/200ML-% IV SOLN
1000.0000 mg | Freq: Three times a day (TID) | INTRAVENOUS | Status: DC
  Administered 2014-01-01 – 2014-01-04 (×9): 1000 mg via INTRAVENOUS
  Filled 2014-01-01 (×10): qty 200

## 2014-01-01 MED ORDER — LORATADINE-PSEUDOEPHEDRINE ER 5-120 MG PO TB12: 1.0000 | ORAL_TABLET | Freq: Two times a day (BID) | ORAL | Status: DC

## 2014-01-01 MED ORDER — HYDROMORPHONE HCL 1 MG/ML IJ SOLN: 0.2500 mg | INTRAMUSCULAR | Status: DC | PRN

## 2014-01-01 MED ORDER — DEXAMETHASONE SODIUM PHOSPHATE 10 MG/ML IJ SOLN
INTRAMUSCULAR | Status: AC
  Filled 2014-01-01: qty 1

## 2014-01-01 MED ORDER — ONDANSETRON HCL 4 MG/2ML IJ SOLN
INTRAMUSCULAR | Status: DC | PRN
  Administered 2014-01-01: 4 mg via INTRAVENOUS

## 2014-01-01 MED ORDER — ADULT MULTIVITAMIN W/MINERALS CH
1.0000 | ORAL_TABLET | Freq: Every day | ORAL | Status: DC
  Administered 2014-01-02 – 2014-01-09 (×8): 1 via ORAL
  Filled 2014-01-01 (×9): qty 1

## 2014-01-01 MED ORDER — ACETAMINOPHEN 10 MG/ML IV SOLN
1000.0000 mg | Freq: Once | INTRAVENOUS | Status: AC
  Administered 2014-01-01: 1000 mg via INTRAVENOUS
  Filled 2014-01-01: qty 100

## 2014-01-01 SURGICAL SUPPLY — 34 items
BINDER ABD UNIV 12 45-62 (WOUND CARE) ×1 IMPLANT
BINDER ABDOMINAL 46IN 62IN (WOUND CARE) ×2
BLADE SURG 15 STRL LF DISP TIS (BLADE) ×1 IMPLANT
BLADE SURG 15 STRL SS (BLADE) ×1
BNDG GAUZE ELAST 4 BULKY (GAUZE/BANDAGES/DRESSINGS) ×4 IMPLANT
CANISTER SUCTION 2500CC (MISCELLANEOUS) IMPLANT
COVER SURGICAL LIGHT HANDLE (MISCELLANEOUS) IMPLANT
DECANTER SPIKE VIAL GLASS SM (MISCELLANEOUS) IMPLANT
DRAPE LAPAROSCOPIC ABDOMINAL (DRAPES) ×2 IMPLANT
DRSG PAD ABDOMINAL 8X10 ST (GAUZE/BANDAGES/DRESSINGS) ×4 IMPLANT
ELECT REM PT RETURN 9FT ADLT (ELECTROSURGICAL) ×2
ELECTRODE REM PT RTRN 9FT ADLT (ELECTROSURGICAL) ×1 IMPLANT
GAUZE SPONGE 4X4 12PLY STRL (GAUZE/BANDAGES/DRESSINGS) IMPLANT
GLOVE BIO SURGEON STRL SZ7.5 (GLOVE) ×2 IMPLANT
GLOVE BIOGEL PI IND STRL 6.5 (GLOVE) ×2 IMPLANT
GLOVE BIOGEL PI IND STRL 7.5 (GLOVE) ×1 IMPLANT
GLOVE BIOGEL PI INDICATOR 6.5 (GLOVE) ×2
GLOVE BIOGEL PI INDICATOR 7.5 (GLOVE) ×1
GOWN STRL REUS W/TWL LRG LVL3 (GOWN DISPOSABLE) ×4 IMPLANT
KIT BASIN OR (CUSTOM PROCEDURE TRAY) ×2 IMPLANT
NEEDLE HYPO 25X1 1.5 SAFETY (NEEDLE) IMPLANT
NS IRRIG 1000ML POUR BTL (IV SOLUTION) ×10 IMPLANT
PENCIL BUTTON HOLSTER BLD 10FT (ELECTRODE) IMPLANT
SPONGE LAP 18X18 X RAY DECT (DISPOSABLE) IMPLANT
SUT MNCRL AB 4-0 PS2 18 (SUTURE) IMPLANT
SUT VIC AB 3-0 SH 27 (SUTURE)
SUT VIC AB 3-0 SH 27XBRD (SUTURE) IMPLANT
SWAB COLLECTION DEVICE MRSA (MISCELLANEOUS) ×2 IMPLANT
SYR BULB 3OZ (MISCELLANEOUS) ×2 IMPLANT
SYR CONTROL 10ML LL (SYRINGE) IMPLANT
TOWEL OR 17X26 10 PK STRL BLUE (TOWEL DISPOSABLE) ×4 IMPLANT
TUBE ANAEROBIC SPECIMEN COL (MISCELLANEOUS) ×2 IMPLANT
WATER STERILE IRR 1000ML POUR (IV SOLUTION) IMPLANT
YANKAUER SUCT BULB TIP NO VENT (SUCTIONS) IMPLANT

## 2014-01-01 NOTE — ED Provider Notes (Signed)
CSN: [id number]     Arrival date & time 01/01/14  1227 History   First MD Initiated Contact with Patient 01/01/14 1319     Chief Complaint  Patient presents with  . Post-op Problem  . Rash     (Consider location/radiation/quality/duration/timing/severity/associated sxs/prior Treatment) HPI 48 year old female presents with worsening abdominal pain near her surgical scar over the last 2 days. A days ago she had emergency surgery for a bowel obstruction in [place]. Patient was discharged from the hospital 3 days ago. She's been having a diffuse rash is been gradually worsening over the last 4 days as well. This rash started before she was put on Bactrim and oxycodone. Patient denies any fevers but has felt chills. Has felt nauseous without vomiting. Denies any drainage from her wound site. The pain is currently severe.  Past Medical History  Diagnosis Date  . Migraine   . Hypertension   . Hypercholesterolemia   . Depression   . Morbid obesity   . Sleep apnea     no CPAP machine   . H/O hiatal hernia   . GERD (gastroesophageal reflux disease)     hx of   . Arthritis     hips, knees and hands   . Anxiety   . PTSD (post-traumatic stress disorder)    Past Surgical History  Procedure Laterality Date  . Foot surgery    . Breath tek h pylori  05/03/2012    Procedure: BREATH TEK H PYLORI;  Surgeon: [name], MD;  Location: [name];  Service: General;  Laterality: N/A;  . Right tube and ovary removed     . Abdominal hysterectomy  1997 & 2006  . Colonoscopy      hx of benign polyps   . 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 Upper gi endoscopy N/A 07/05/2012    Procedure: UPPER GI ENDOSCOPY;  Surgeon: [name], MD;  Location: [facility];  Service: General;  Laterality: N/A;  . Laparoscopic lysis of adhesions N/A 07/05/2012    Procedure: LAPAROSCOPIC  LYSIS OF ADHESIONS;  Surgeon: [name], MD;  Location: [facility];  Service: General;  Laterality: N/A;  repair of abdominal wall hernia   Family History  Problem Relation Age of Onset  . Cancer Mother     colon  . Cancer Maternal Aunt     breast  . Depression Father   . Depression Sister   . Alcohol abuse Paternal Uncle   . Bipolar disorder Cousin    History  Substance Use Topics  . Smoking status: Former Smoker    Types: Cigars    Quit date: 04/27/2004  . Smokeless tobacco: Never Used  . Alcohol Use: Yes     Comment: Occasional use   OB History   Grav Para Term Preterm Abortions TAB SAB Ect Mult Living                 Review of Systems  Constitutional: Negative for fever.  Gastrointestinal: Positive for nausea and abdominal pain. Negative for vomiting.  Skin: Positive for color change, rash and wound.  All other systems reviewed and are negative.     Allergies  Amoxicillin  Home Medications   Prior to Admission medications   Medication Sig Start Date End Date Taking? Authorizing Provider  acetaminophen (TYLENOL) 500 MG tablet Take 1 tablet (500 mg total)  by mouth every 6 (six) hours as needed (pain.). 11/30/13  Yes Encarnacion Slates, NP  clonazePAM (KLONOPIN) 1 MG tablet Take 1 tablet (1 mg total) by mouth at bedtime. For anxiety 12/06/13  Yes Leonides Grills, MD  DULoxetine (CYMBALTA) 30 MG capsule Take 1 capsule (30 mg total) by mouth daily. For depression 11/30/13  Yes Encarnacion Slates, NP  loratadine-pseudoephedrine (ALAVERT ALLERGY/SINUS) 5-120 MG per tablet Take 1 tablet by mouth 2 (two) times daily.   Yes Historical Provider, MD  Multiple Vitamin (MULTIVITAMIN WITH MINERALS) TABS tablet Take 1 tablet by mouth daily. For low vitamin 11/30/13  Yes Encarnacion Slates, NP  OXcarbazepine (TRILEPTAL) 150 MG tablet Take 1 tablet (150 mg total) by mouth 2 (two) times daily. For mood stabilization 11/30/13  Yes Encarnacion Slates, NP  oxyCODONE-acetaminophen (PERCOCET/ROXICET) 5-325  MG per tablet Take 1-2 tablets by mouth every 4 (four) hours as needed for moderate pain or severe pain.   Yes Historical Provider, MD  sulfamethoxazole-trimethoprim (BACTRIM DS) 800-160 MG per tablet Take 1 tablet by mouth 2 (two) times daily.   Yes Historical Provider, MD  traZODone (DESYREL) 50 MG tablet Take 1 tablet (50 mg total) by mouth at bedtime. For sleep 11/30/13  Yes Encarnacion Slates, NP   BP 103/57  Pulse 77  Temp(Src) 97.8 F (36.6 C) (Oral)  Resp 17  SpO2 100% Physical Exam  Nursing note and vitals reviewed. Constitutional: She is oriented to person, place, and time. She appears well-developed and well-nourished.  HENT:  Head: Normocephalic and atraumatic.  Right Ear: External ear normal.  Left Ear: External ear normal.  Nose: Nose normal.  Eyes: Right eye exhibits no discharge. Left eye exhibits no discharge.  Cardiovascular: Normal rate, regular rhythm and normal heart sounds.   Pulmonary/Chest: Effort normal and breath sounds normal.  Abdominal: Soft. There is tenderness.    Neurological: She is alert and oriented to person, place, and time.  Skin: Skin is warm and dry. Rash noted. Rash is macular (diffuse macular rash, mostly upper chest/back and lighter on extremities. No skin sloughing or [place]).    ED Course  Procedures (including critical care time) Labs Review Labs Reviewed  CBC WITH DIFFERENTIAL - Abnormal; Notable for the following:    WBC 12.4 (*)    RBC 3.49 (*)    Hemoglobin 11.3 (*)    HCT 32.5 (*)    Platelets 410 (*)    Neutrophils Relative % 91 (*)    Neutro Abs 11.3 (*)    Lymphocytes Relative 4 (*)    Lymphs Abs 0.5 (*)    All other components within normal limits  COMPREHENSIVE METABOLIC PANEL - Abnormal; Notable for the following:    Sodium 136 (*)    Chloride 94 (*)    Glucose, Bld 111 (*)    Albumin 2.7 (*)    Alkaline Phosphatase 123 (*)    All other components within normal limits    Imaging Review Ct Abdomen Pelvis W  Contrast  01/01/2014   CLINICAL DATA:  Recent small bowel obstruction now with right-sided pain at the incision site ; elevated white blood cell count.  EXAM: CT ABDOMEN AND PELVIS WITH CONTRAST  TECHNIQUE: Multidetector CT imaging of the abdomen and pelvis was performed using the standard protocol following bolus administration of intravenous contrast.  CONTRAST:  116mL OMNIPAQUE IOHEXOL 300 MG/ML  SOLN  COMPARISON:  None.  FINDINGS: There is fluid and gas within the lower anterior abdominal and pelvic  incision site. The is 7.5 cm AP x 8 cm transversely by 15.3 cm longitudinally. There is thickening of the inferior aspect of the right rectus muscle adjacent to the deep portion of this incisional abscess. No definite loop of involved bowel is identified. The surgical suture material within small bowel in the mid pelvis is at some distance from the inflammatory changes. The small bowel and large bowel gas pattern is normal. No free extraluminal gas collections within the peritoneal cavity are demonstrated. There is fluid in the right perirectal region which may reflect a mildly distended small bowel loop or could reflect an extraluminal collection. It contains no gas. It measures 4.5 x 3.1 x 4.5 cm.  The liver, gallbladder, pancreas, adrenal glands, and kidneys are unremarkable. 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 There is at least 1 tiny accessory spleen present. 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 The caliber of the abdominal aorta is normal. The periaortic and pericaval regions are normal.  The lumbar vertebral bodies are preserved in height. There is disc space narrowing the bony pelvis is unremarkable. The lung bases are clear.  IMPRESSION: 1. There is an incisional abscess in the anterior abdominal wall containing fluid and gas. It extends from the peritoneal cavity to the skin surface. There is  mild edema within the adjacent rectus muscle inferiorly. A smaller fluid collection versus an unopacified mildly dilated small bowel loop is noted in the right perirectal region. 2. The small and large bowel gas pattern is unremarkable. 3. No acute visceral abnormality is demonstrated elsewhere.   Electronically Signed   By: David  Martinique   On: 01/01/2014 15:01     EKG Interpretation None      MDM   Final diagnoses:  Post-operative wound abscess, initial encounter  Rash    Patient appears to have a postoperative infection from her recent surgery. She's otherwise stable. She has a mild white blood cell elevation. I discussed her case with the surgeon on call, Dr. [name], who will take the patient in the operating room for incision and drainage. Will place patient on antibiotics. Her rash is nonspecific. There is no evidence this is Stevens-Johnson's and there is no mucous membrane involvement. Patient been on several different medicines over her recent hospitalization and when she was discharged with is difficult to tell what the inciting factor. However this time she has no wheezing, shortness of breath, or signs of anaphylaxis.    Ephraim Hamburger, MD 01/01/14 [phone]

## 2014-01-01 NOTE — ED Notes (Signed)
Pt had bowel obstruction in [place] last Saturday.  Pt did run a fever the entire time was in the hospital with unknown source of why. Pt now home and having pain around the right side of her staples/incision, abd pain that is not related to staples.  And rash on her body.

## 2014-01-01 NOTE — Progress Notes (Signed)
ANTIBIOTIC CONSULT NOTE - INITIAL  Pharmacy Consult for Vancomycin Indication: Surgical wound infection  Allergies  Allergen Reactions  . Amoxicillin Hives    Patient Measurements:    Vital Signs: Temp: 97.8 F (36.6 C) (09/20 1236) Temp src: Oral (09/20 1236) BP: 111/49 mmHg (09/20 1500) Pulse Rate: 66 (09/20 1500) Intake/Output from previous day:   Intake/Output from this shift:    Labs:  Recent Labs  01/01/14 1402  WBC 12.4*  HGB 11.3*  PLT 410*  CREATININE 0.60   The CrCl is unknown because both a height and weight (above a minimum accepted value) are required for this calculation. No results found for this basename: VANCOTROUGH, VANCOPEAK, VANCORANDOM, GENTTROUGH, GENTPEAK, GENTRANDOM, TOBRATROUGH, TOBRAPEAK, TOBRARND, AMIKACINPEAK, AMIKACINTROU, AMIKACIN,  in the last 72 hours   Microbiology: No results found for this or any previous visit (from the past 720 hour(s)).  Medical History: Past Medical History  Diagnosis Date  . Migraine   . Hypertension   . Hypercholesterolemia   . Depression   . Morbid obesity   . Sleep apnea     no CPAP machine   . H/O hiatal hernia   . GERD (gastroesophageal reflux disease)     hx of   . Arthritis     hips, knees and hands   . Anxiety   . PTSD (post-traumatic stress disorder)     Medications:   (Not in a hospital admission) Scheduled:   Assessment: 48yo F s/p surgery for SBO at [facility] 3 days PTA, presents with abd pain near surgical scar, nausea, and worsening diffuse rash. Rash started before she began a 10 day course of Bactrim on 9/18. Cipro and Flagyl single doses ordered in the ED. Pharmacy is asked to dose Vancomycin for wound infection. Weight 93kg last month.  9/20 >> Vanc >>    Tmax: WBCs: Renal: SCr wnl, CrCl >100  Goal of Therapy:  Vancomycin trough level 15-20 mcg/ml Appropriate antibiotic dosing for renal function; eradication of infection  Plan:   Vancomycin 1g IV  q8h.  Measure Vanc trough at steady state.  Follow up renal fxn and culture results.  Obtain updated height and weight.  Romeo Rabon, PharmD, pager [phone]. 01/01/2014,3:33 PM.

## 2014-01-01 NOTE — Op Note (Signed)
Preoperative Diagnosis:  Post-operative wound abscess, initial encounter [998.59]  Postoprative Diagnosis: Post-operative wound abscess, initial encounter [998.59]  Procedure: Procedure(s): IRRIGATION AND [place] TISSUE WOUND ABSCESS   Surgeon: [name] T   Assistants:  none  Anesthesia:  General endotracheal anesthesia  Indications: [name] who 8 days ago underwent laparotomy and small bowel resection for a gangrenous loop of small intestine secondary to a closed loop obstruction. This was done in [place]. She now presents with fever and increasing swelling and discomfort at her midline incision. CT scan has revealed a large wound abscess with fluid and air. There does not appear to be any extension into or source in the peritoneal cavity. I recommended incision and drainage and debridement in the operating room under general anesthesia. We discussed the procedure and risks detailed elsewhere and she is in agreement.   Procedure Detail: Patient was brought to the operating room, placed in the supine position on the operating table, and general endotracheal anesthesia induced. She had received preoperative broad-spectrum IV antibiotics. The abdomen was sterilely prepped and draped. Patient timeout was performed and correct procedure verified. PAS were in place. The incision which was only 42 days old it was bluntly opened near the fluctuant area and a very large amount of brown and tan foul-smelling purulent material drained. The wound was suctioned and widely opened completely as the process extended the entire length of the wound. After completely evacuating the liquid contents it was apparent that there was some degree of necrosis of the fascia and the subcutaneous tissue of the wound. I debrided obviously necrotic a midline fashion which had separated a couple of centimeters in most areas with exposed monofilament suture which I  left intact. I debrided approximately 20 cm of fascial tissue and approximately 20 cm of necrotic subcutaneous tissue. Some probably Superficially necrotic fascia was left as I did not want to disrupt completely the integrity of the midline wound Any further. There did not appear to be any tracking or connection deeper into the abdomen although likely there was some exposed healthy omentum at the base of the incision. No evidence of enteric content. The wound was irrigated with multiple liters of sterile saline. The wound was then packed open with 2 moist saline Kerlix sponges and dry sterile dressing was applied. Sponge needle and instrument counts were correct.  1.  Progress note or procedure note with a detailed description of the procedure.  2.  Tool used for debridement (curette, scapel, etc.)  Scissors, sharp debridement  3.  Frequency of surgical debridement.   1  4.  Measurement of total devitalized tissue (wound surface) before and after surgical debridement.   20 cm of fascia. 20 cm of subcutaneous tissue  5.  Area and depth of devitalized tissue removed from wound.  The depth of 1 cm      Findings: As above  Estimated Blood Loss:  Minimal         Drains: wwound packed open with moist Kerlix  Blood Given: none          Specimens: Culture and sensitivity        Complications:  * No complications entered in OR log *         Disposition: PACU - hemodynamically stable.         Condition: stable

## 2014-01-01 NOTE — Anesthesia Preprocedure Evaluation (Signed)
Anesthesia Evaluation  Patient identified by MRN, date of birth, ID band Patient awake    Reviewed: Allergy & Precautions, H&P , NPO status , Patient's Chart, lab work & pertinent test results  Airway Mallampati: II TM Distance: >3 FB Neck ROM: Full    Dental no notable dental hx.    Pulmonary sleep apnea , former smoker,  breath sounds clear to auscultation  Pulmonary exam normal       Cardiovascular hypertension, Rhythm:Regular Rate:Normal     Neuro/Psych  Headaches, PSYCHIATRIC DISORDERS Anxiety Depression    GI/Hepatic Neg liver ROS, hiatal hernia, GERD-  ,  Endo/Other  negative endocrine ROS  Renal/GU negative Renal ROS  negative genitourinary   Musculoskeletal  (+) Arthritis -,   Abdominal   Peds negative pediatric ROS (+)  Hematology negative hematology ROS (+)   Anesthesia Other Findings   Reproductive/Obstetrics negative OB ROS                           Anesthesia Physical Anesthesia Plan  ASA: II  Anesthesia Plan: General   Post-op Pain Management:    Induction: Intravenous  Airway Management Planned: Oral ETT  Additional Equipment:   Intra-op Plan:   Post-operative Plan: Extubation in OR  Informed Consent: I have reviewed the patients History and Physical, chart, labs and discussed the procedure including the risks, benefits and alternatives for the proposed anesthesia with the patient or authorized representative who has indicated his/her understanding and acceptance.   Dental advisory given  Plan Discussed with: CRNA  Anesthesia Plan Comments:         Anesthesia Quick Evaluation

## 2014-01-01 NOTE — Transfer of Care (Signed)
Immediate Anesthesia Transfer of Care Note  Patient: Brittany [name]  Procedure(s) Performed: Procedure(s): IRRIGATION AND DEBRIDEMENT ABSCESS (N/A)  Patient Location: PACU  Anesthesia Type:General  Level of Consciousness: awake, alert , oriented and patient cooperative  Airway & Oxygen Therapy: Patient Spontanous Breathing and Patient connected to face mask oxygen  Post-op Assessment: Report given to PACU RN and Post -op Vital signs reviewed and stable  Post vital signs: Reviewed and stable  Complications: No apparent anesthesia complications

## 2014-01-01 NOTE — H&P (Signed)
Brittany [name] is an 48 y.o. female.    Chief Complaint: Abdominal pain and fever   HPI: [name] Presented to the hospital in [place] 8 days ago with acute onset of abdominal pain. She was found to have a small bowel obstruction and underwent emergency laparotomy with findings of a necrotic segment of small bowel from closed loop obstruction from a pelvic adhesion. She underwent small bowel resection. Her postoperative course was fairly smooth but she did have fever without any definite source found prior to discharge. She was sent home on sulfa antibiotic. She has been home for 3 days. She has developed progressive swelling and discomfort on the right side of her incision. No drainage. She continues to have fever and some chills. She has had no nausea or vomiting. Appetite is poor. She has developed a diffuse rash over the last 3 days. 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 The operative report indicates a pelvic adhesion and there is no note made of any specific problems from her bypass such as an internal hernia at the time of her laparotomy. A CT scan here as below has indicated a large wound abscess.  Past Medical History  Diagnosis Date  . Migraine   . Hypertension   . Hypercholesterolemia   . Depression   . Morbid obesity   . Sleep apnea     no CPAP machine   . H/O hiatal hernia   . GERD (gastroesophageal reflux disease)     hx of   . Arthritis     hips, knees and hands   . Anxiety   . PTSD (post-traumatic stress disorder)     Past Surgical History  Procedure Laterality Date  . Foot surgery    . Breath tek h pylori  05/03/2012    Procedure: BREATH TEK H PYLORI;  Surgeon: [name], MD;  Location: [name];  Service: General;  Laterality: N/A;  . Right tube and ovary removed     . Abdominal hysterectomy  1997 & 2006  . Colonoscopy      hx of benign polyps   . Gastric roux-en-y N/A 07/05/2012    Procedure:  LAPAROSCOPIC ROUX-EN-Y GASTRIC;  Surgeon: [name], MD;  Location: [facility];  Service: General;  Laterality: N/A;  . Upper gi endoscopy N/A 07/05/2012    Procedure: UPPER GI ENDOSCOPY;  Surgeon: [name], MD;  Location: [facility];  Service: General;  Laterality: N/A;  . Laparoscopic lysis of adhesions N/A 07/05/2012    Procedure: LAPAROSCOPIC LYSIS OF ADHESIONS;  Surgeon: [name], MD;  Location: [facility];  Service: General;  Laterality: N/A;  repair of abdominal wall hernia    Family History  Problem Relation Age of Onset  . Cancer Mother     colon  . Cancer Maternal Aunt     breast  . Depression Father   . Depression Sister   . Alcohol abuse Paternal Uncle   . Bipolar disorder Cousin    Social History:  reports that she quit smoking about 9 years ago. Her smoking use included Cigars. She has never used smokeless tobacco. She reports that she drinks alcohol. She reports that she does not use illicit drugs.  Allergies:  Allergies  Allergen Reactions  . Amoxicillin Hives   Current Facility-Administered Medications  Medication Dose Route Frequency Provider Last Rate Last Dose  . ciprofloxacin (CIPRO) IVPB 400 mg  400 mg Intravenous Once [employer]  Milinda Pointer, MD      . metroNIDAZOLE (FLAGYL) IVPB 500 mg  500 mg Intravenous Once Ephraim Hamburger, MD 100 mL/hr at 01/01/14 1522 500 mg at 01/01/14 1522  . vancomycin (VANCOCIN) IVPB 1000 mg/200 mL premix  1,000 mg Intravenous Q8H Ephraim Hamburger, MD       Current Outpatient Prescriptions  Medication Sig Dispense Refill  . acetaminophen (TYLENOL) 500 MG tablet Take 1 tablet (500 mg total) by mouth every 6 (six) hours as needed (pain.).  30 tablet  0  . clonazePAM (KLONOPIN) 1 MG tablet Take 1 tablet (1 mg total) by mouth at bedtime. For anxiety  30 tablet  0  . DULoxetine (CYMBALTA) 30 MG capsule Take 1 capsule (30 mg total) by mouth daily. For depression  30 capsule  0  . loratadine-pseudoephedrine (ALAVERT ALLERGY/SINUS) 5-120 MG  per tablet Take 1 tablet by mouth 2 (two) times daily.      . Multiple Vitamin (MULTIVITAMIN WITH MINERALS) TABS tablet Take 1 tablet by mouth daily. For low vitamin      . OXcarbazepine (TRILEPTAL) 150 MG tablet Take 1 tablet (150 mg total) by mouth 2 (two) times daily. For mood stabilization  60 tablet  0  . oxyCODONE-acetaminophen (PERCOCET/ROXICET) 5-325 MG per tablet Take 1-2 tablets by mouth every 4 (four) hours as needed for moderate pain or severe pain.      Marland Kitchen sulfamethoxazole-trimethoprim (BACTRIM DS) 800-160 MG per tablet Take 1 tablet by mouth 2 (two) times daily.      . traZODone (DESYREL) 50 MG tablet Take 1 tablet (50 mg total) by mouth at bedtime. For sleep  30 tablet  0     Results for orders placed during the hospital encounter of 01/01/14 (from the past 48 hour(s))  CBC WITH DIFFERENTIAL     Status: Abnormal   Collection Time    01/01/14  2:02 PM      Result Value Ref Range   WBC 12.4 (*) 4.0 - 10.5 K/uL   RBC 3.49 (*) 3.87 - 5.11 MIL/uL   Hemoglobin 11.3 (*) 12.0 - 15.0 g/dL   HCT 32.5 (*) 36.0 - 46.0 %   MCV 93.1  78.0 - 100.0 fL   MCH 32.4  26.0 - 34.0 pg   MCHC 34.8  30.0 - 36.0 g/dL   RDW 12.4  11.5 - 15.5 %   Platelets 410 (*) 150 - 400 K/uL   Neutrophils Relative % 91 (*) 43 - 77 %   Neutro Abs 11.3 (*) 1.7 - 7.7 K/uL   Lymphocytes Relative 4 (*) 12 - 46 %   Lymphs Abs 0.5 (*) 0.7 - 4.0 K/uL   Monocytes Relative 5  3 - 12 %   Monocytes Absolute 0.6  0.1 - 1.0 K/uL   Eosinophils Relative 0  0 - 5 %   Eosinophils Absolute 0.0  0.0 - 0.7 K/uL   Basophils Relative 0  0 - 1 %   Basophils Absolute 0.0  0.0 - 0.1 K/uL  COMPREHENSIVE METABOLIC PANEL     Status: Abnormal   Collection Time    01/01/14  2:02 PM      Result Value Ref Range   Sodium 136 (*) 137 - 147 mEq/L   Potassium 3.8  3.7 - 5.3 mEq/L   Chloride 94 (*) 96 - 112 mEq/L   CO2 29  19 - 32 mEq/L   Glucose, Bld 111 (*) 70 - 99 mg/dL   BUN 11  6 -  23 mg/dL   Creatinine, Ser 0.60  0.50 - 1.10 mg/dL    Calcium 8.9  8.4 - 10.5 mg/dL   Total Protein 7.2  6.0 - 8.3 g/dL   Albumin 2.7 (*) 3.5 - 5.2 g/dL   AST 21  0 - 37 U/L   ALT 29  0 - 35 U/L   Alkaline Phosphatase 123 (*) 39 - 117 U/L   Total Bilirubin 0.3  0.3 - 1.2 mg/dL   GFR calc non Af Amer >90  >90 mL/min   GFR calc Af Amer >90  >90 mL/min   Comment: (NOTE)     The eGFR has been calculated using the CKD EPI equation.     This calculation has not been validated in all clinical situations.     eGFR's persistently <90 mL/min signify possible Chronic Kidney     Disease.   Anion gap 13  5 - 15   Ct Abdomen Pelvis W Contrast  01/01/2014   CLINICAL DATA:  Recent small bowel obstruction now with right-sided pain at the incision site ; elevated white blood cell count.  EXAM: CT ABDOMEN AND PELVIS WITH CONTRAST  TECHNIQUE: Multidetector CT imaging of the abdomen and pelvis was performed using the standard protocol following bolus administration of intravenous contrast.  CONTRAST:  198m OMNIPAQUE IOHEXOL 300 MG/ML  SOLN  COMPARISON:  None.  FINDINGS: There is fluid and gas within the lower anterior abdominal and pelvic incision site. The is 7.5 cm AP x 8 cm transversely by 15.3 cm longitudinally. There is thickening of the inferior aspect of the right rectus muscle adjacent to the deep portion of this incisional abscess. No definite loop of involved bowel is identified. The surgical suture material within small bowel in the mid pelvis is at some distance from the inflammatory changes. The small bowel and large bowel gas pattern is normal. No free extraluminal gas collections within the peritoneal cavity are demonstrated. There is fluid in the right perirectal region which may reflect a mildly distended small bowel loop or could reflect an extraluminal collection. It contains no gas. It measures 4.5 x 3.1 x 4.5 cm.  The liver, gallbladder, pancreas, adrenal glands, and kidneys are unremarkable. 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 There is at least 1 tiny accessory spleen present. 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 The caliber of the abdominal aorta is normal. The periaortic and pericaval regions are normal.  The lumbar vertebral bodies are preserved in height. There is disc space narrowing the bony pelvis is unremarkable. The lung bases are clear.  IMPRESSION: 1. There is an incisional abscess in the anterior abdominal wall containing fluid and gas. It extends from the peritoneal cavity to the skin surface. There is mild edema within the adjacent rectus muscle inferiorly. A smaller fluid collection versus an unopacified mildly dilated small bowel loop is noted in the right perirectal region. 2. The small and large bowel gas pattern is unremarkable. 3. No acute visceral abnormality is demonstrated elsewhere.   Electronically Signed   By: David  JMartinique  On: 01/01/2014 15:01    Review of Systems  Constitutional: Positive for fever, chills and malaise/fatigue.  Respiratory: Negative.   Cardiovascular: Negative.   Gastrointestinal: Positive for abdominal pain. Negative for nausea, vomiting, diarrhea, constipation and blood in stool.  Genitourinary: Negative.   Psychiatric/Behavioral: Positive for depression.    Blood pressure 111/49, pulse 66, temperature 97.8  F (36.6 C), temperature source Oral, resp. rate 12, height '5\' 10"'  (1.778 m), weight 195 lb (88.451 kg), SpO2 94.00%. Physical Exam  General: Alert, well-developed Caucasian female, in no distress Skin: Warm and dry. Diffuse erythematous papular rash over her trunk and extremities HEENT: No palpable masses or thyromegaly. Sclera nonicteric. Pupils equal round and reactive. Oropharynx clear. Lymph nodes: No cervical, supraclavicular, or inguinal nodes palpable. Lungs: Breath sounds clear and equal without increased work of breathing Cardiovascular: Regular rate and rhythm  without murmur. No JVD or edema. Peripheral pulses intact. Abdomen: Nondistended. Midline incision with staples in place. There is moderate swelling and tenderness to the right side of her midline incision. No marked erythema or obvious fluctuance. Extremities: No edema or joint swelling or deformity. No chronic venous stasis changes. Neurologic: Alert and fully oriented. Affect normal  Assessment/Plan Wound infection/abscess following laparotomy for closed loop obstruction of small bowel obstruction 8 days ago in [place]. This is too large and deep to drain at the bedside.I have recommended incision and drainage under general anesthesia in the operating room. We discussed the indications for a nature of the procedure as well as risks of bleeding and infection and that the wound will need to be left open to heal by secondary intention. She understands and agrees to proceed.  Kimbella Heisler T 01/01/2014, 3:58 PM

## 2014-01-02 ENCOUNTER — Encounter (HOSPITAL_COMMUNITY): Payer: Self-pay | Admitting: General Surgery

## 2014-01-02 ENCOUNTER — Other Ambulatory Visit (HOSPITAL_COMMUNITY): Payer: 59

## 2014-01-02 ENCOUNTER — Ambulatory Visit (HOSPITAL_COMMUNITY): Payer: Self-pay | Admitting: Psychiatry

## 2014-01-02 LAB — CBC
HEMATOCRIT: 30.5 % — AB (ref 36.0–46.0)
Hemoglobin: 10.5 g/dL — ABNORMAL LOW (ref 12.0–15.0)
MCH: 32.7 pg (ref 26.0–34.0)
MCHC: 34.4 g/dL (ref 30.0–36.0)
MCV: 95 fL (ref 78.0–100.0)
PLATELETS: 403 10*3/uL — AB (ref 150–400)
RBC: 3.21 MIL/uL — ABNORMAL LOW (ref 3.87–5.11)
RDW: 12.4 % (ref 11.5–15.5)
WBC: 11.9 10*3/uL — AB (ref 4.0–10.5)

## 2014-01-02 LAB — BASIC METABOLIC PANEL
ANION GAP: 10 (ref 5–15)
BUN: 9 mg/dL (ref 6–23)
CHLORIDE: 101 meq/L (ref 96–112)
CO2: 27 mEq/L (ref 19–32)
Calcium: 8.5 mg/dL (ref 8.4–10.5)
Creatinine, Ser: 0.43 mg/dL — ABNORMAL LOW (ref 0.50–1.10)
Glucose, Bld: 199 mg/dL — ABNORMAL HIGH (ref 70–99)
Potassium: 4.3 mEq/L (ref 3.7–5.3)
Sodium: 138 mEq/L (ref 137–147)

## 2014-01-02 NOTE — Progress Notes (Signed)
1 Day Post-Op  Subjective: She is red all over from the pre-admit home antibiotics.  He abdomen isn't hurting to much.    Objective: Vital signs in last 24 hours: Temp:  [97.5 F (36.4 C)-99.3 F (37.4 C)] 97.7 F (36.5 C) (09/21 0515) Pulse Rate:  [51-83] 51 (09/21 0515) Resp:  [10-19] 16 (09/21 0515) BP: (93-113)/(46-86) 100/56 mmHg (09/21 0515) SpO2:  [91 %-100 %] 93 % (09/21 0515) Weight:  [88.451 kg (195 lb)] 88.451 kg (195 lb) (09/20 1553) Last BM Date: 12/31/13 240 PO Afebrile, SBP 100 range HR also low, in the 50's range ( this may be a little lower than her baseline when ambulatory.) Labs stable, minimal change in the WBC Diet:  Bariatric Intake/Output from previous day: 09/20 0701 - 09/21 0700 In: 2207.5 [P.O.:240; I.V.:1967.5] Out: 1450 [Urine:1450] Intake/Output this shift: Total I/O In: -  Out: 250 [Urine:250]  General appearance: alert, cooperative and no distress Resp: clear to auscultation bilaterally Abdominal wound.  It is about 7 CM deep, and 18-20 CM long.  Some necrotic/fibrous tissue at the base.  It does have an odor to it .  The dressing came out pretty easily, there is suture at the base.  Right now the fascia looks intact. Lab Results:   Recent Labs  01/01/14 1402 01/02/14 0456  WBC 12.4* 11.9*  HGB 11.3* 10.5*  HCT 32.5* 30.5*  PLT 410* 403*    BMET  Recent Labs  01/01/14 1402 01/02/14 0456  NA 136* 138  K 3.8 4.3  CL 94* 101  CO2 29 27  GLUCOSE 111* 199*  BUN 11 9  CREATININE 0.60 0.43*  CALCIUM 8.9 8.5   PT/INR No results found for this basename: LABPROT, INR,  in the last 72 hours   Recent Labs Lab 01/01/14 1402  AST 21  ALT 29  ALKPHOS 123*  BILITOT 0.3  PROT 7.2  ALBUMIN 2.7*     Lipase  No results found for this basename: lipase     Studies/Results: Ct Abdomen Pelvis W Contrast  01/01/2014   CLINICAL DATA:  Recent small bowel obstruction now with right-sided pain at the incision site ; elevated white  blood cell count.  EXAM: CT ABDOMEN AND PELVIS WITH CONTRAST  TECHNIQUE: Multidetector CT imaging of the abdomen and pelvis was performed using the standard protocol following bolus administration of intravenous contrast.  CONTRAST:  122mL OMNIPAQUE IOHEXOL 300 MG/ML  SOLN  COMPARISON:  None.  FINDINGS: There is fluid and gas within the lower anterior abdominal and pelvic incision site. The is 7.5 cm AP x 8 cm transversely by 15.3 cm longitudinally. There is thickening of the inferior aspect of the right rectus muscle adjacent to the deep portion of this incisional abscess. No definite loop of involved bowel is identified. The surgical suture material within small bowel in the mid pelvis is at some distance from the inflammatory changes. The small bowel and large bowel gas pattern is normal. No free extraluminal gas collections within the peritoneal cavity are demonstrated. There is fluid in the right perirectal region which may reflect a mildly distended small bowel loop or could reflect an extraluminal collection. It contains no gas. It measures 4.5 x 3.1 x 4.5 cm.  The liver, gallbladder, pancreas, adrenal glands, and kidneys are unremarkable. 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 There is at least 1 tiny accessory spleen present. Postsurgical changes from the patient's previous gastric bypass are  noted. The caliber of the abdominal aorta is normal. The periaortic and pericaval regions are normal.  The lumbar vertebral bodies are preserved in height. There is disc space narrowing the bony pelvis is unremarkable. The lung bases are clear.  IMPRESSION: 1. There is an incisional abscess in the anterior abdominal wall containing fluid and gas. It extends from the peritoneal cavity to the skin surface. There is mild edema within the adjacent rectus muscle inferiorly. A smaller fluid collection versus an unopacified mildly dilated small bowel loop  is noted in the right perirectal region. 2. The small and large bowel gas pattern is unremarkable. 3. No acute visceral abnormality is demonstrated elsewhere.   Electronically Signed   By: David  Martinique   On: 01/01/2014 15:01    Medications: . ciprofloxacin  400 mg Intravenous Q12H  . clonazePAM  1 mg Oral QHS  . DULoxetine  30 mg Oral Daily  . enoxaparin (LOVENOX) injection  40 mg Subcutaneous Q24H  . loratadine  10 mg Oral Daily  . metronidazole  500 mg Intravenous 3 times per day  . multivitamin with minerals  1 tablet Oral Daily  . OXcarbazepine  150 mg Oral BID  . pseudoephedrine  120 mg Oral BID  . traZODone  50 mg Oral QHS  . vancomycin  1,000 mg Intravenous Q8H   . dextrose 5 % and 0.9 % NaCl with KCl 20 mEq/L 75 mL/hr at 01/01/14 1946   Prior to Admission medications   Medication Sig Start Date End Date Taking? Authorizing Provider  acetaminophen (TYLENOL) 500 MG tablet Take 1 tablet (500 mg total) by mouth every 6 (six) hours as needed (pain.). 11/30/13  Yes Encarnacion Slates, NP  clonazePAM (KLONOPIN) 1 MG tablet Take 1 tablet (1 mg total) by mouth at bedtime. For anxiety 12/06/13  Yes Leonides Grills, MD  DULoxetine (CYMBALTA) 30 MG capsule Take 1 capsule (30 mg total) by mouth daily. For depression 11/30/13  Yes Encarnacion Slates, NP  loratadine-pseudoephedrine (ALAVERT ALLERGY/SINUS) 5-120 MG per tablet Take 1 tablet by mouth 2 (two) times daily.   Yes Historical Provider, MD  Multiple Vitamin (MULTIVITAMIN WITH MINERALS) TABS tablet Take 1 tablet by mouth daily. For low vitamin 11/30/13  Yes Encarnacion Slates, NP  OXcarbazepine (TRILEPTAL) 150 MG tablet Take 1 tablet (150 mg total) by mouth 2 (two) times daily. For mood stabilization 11/30/13  Yes Encarnacion Slates, NP  oxyCODONE-acetaminophen (PERCOCET/ROXICET) 5-325 MG per tablet Take 1-2 tablets by mouth every 4 (four) hours as needed for moderate pain or severe pain.   Yes Historical Provider, MD  sulfamethoxazole-trimethoprim (BACTRIM  DS) 800-160 MG per tablet Take 1 tablet by mouth 2 (two) times daily.   Yes Historical Provider, MD  traZODone (DESYREL) 50 MG tablet Take 1 tablet (50 mg total) by mouth at bedtime. For sleep 11/30/13  Yes Encarnacion Slates, NP     Assessment/Plan S/p SBO with emergent laparotomy, necrotic bowel resection 12/24/13, [place].   Post-operative wound abscess, I&D subcutaneous and fascial tissue wound abscess, 01/01/14, Dr. [name] Prior LAPAROSCOPIC ROUX-EN-Y GASTRIC; Surgeon: [name], MD 07/05/12. Hypertension Migraines Sleep apnea, no CPAP Hx of hiatal hernia and GERD Severe recurrent major depression without psychotic features, PTSD, hospitalize, 8/11-8/19/15 for this. Arthritis Body mass index is 27.98 ( she lost 120 pounds with Roux) dyslipidemia   Plan;   Wet to dry dressing, I will get the wound care nurses to help with her.  We are trying to  hold back on tape use.  She already has an allergy rash all over including her abdomen which is being attributed to the Bactrim.  We may need some Dakin's later, but not the 1st post op day. No foley   LOS: 1 day    Munirah Doerner 01/02/2014

## 2014-01-02 NOTE — Anesthesia Postprocedure Evaluation (Signed)
  Anesthesia Post-op Note  Patient: Brittany [name]  Procedure(s) Performed: Procedure(s) (LRB): IRRIGATION AND DEBRIDEMENT ABSCESS (N/A)  Patient Location: PACU  Anesthesia Type: General  Level of Consciousness: awake and alert   Airway and Oxygen Therapy: Patient Spontanous Breathing  Post-op Pain: mild  Post-op Assessment: Post-op Vital signs reviewed, Patient's Cardiovascular Status Stable, Respiratory Function Stable, Patent Airway and No signs of Nausea or vomiting  Last Vitals:  Filed Vitals:   01/02/14 0515  BP: 100/56  Pulse: 51  Temp: 36.5 C  Resp: 16    Post-op Vital Signs: stable   Complications: No apparent anesthesia complications

## 2014-01-03 ENCOUNTER — Other Ambulatory Visit (HOSPITAL_COMMUNITY): Payer: 59

## 2014-01-03 LAB — BASIC METABOLIC PANEL
ANION GAP: 10 (ref 5–15)
BUN: 12 mg/dL (ref 6–23)
CALCIUM: 8.2 mg/dL — AB (ref 8.4–10.5)
CO2: 25 mEq/L (ref 19–32)
Chloride: 105 mEq/L (ref 96–112)
Creatinine, Ser: 0.52 mg/dL (ref 0.50–1.10)
GFR calc Af Amer: >90 mL/min (ref >90)
Glucose, Bld: 114 mg/dL — ABNORMAL HIGH (ref 70–99)
POTASSIUM: 3.7 meq/L (ref 3.7–5.3)
SODIUM: 140 meq/L (ref 137–147)

## 2014-01-03 LAB — CBC
HCT: 30.1 % — ABNORMAL LOW (ref 36.0–46.0)
HEMOGLOBIN: 10.1 g/dL — AB (ref 12.0–15.0)
MCH: 32.2 pg (ref 26.0–34.0)
MCHC: 33.6 g/dL (ref 30.0–36.0)
MCV: 95.9 fL (ref 78.0–100.0)
PLATELETS: 505 10*3/uL — AB (ref 150–400)
RBC: 3.14 MIL/uL — ABNORMAL LOW (ref 3.87–5.11)
RDW: 12.9 % (ref 11.5–15.5)
WBC: 11.4 10*3/uL — ABNORMAL HIGH (ref 4.0–10.5)

## 2014-01-03 LAB — VANCOMYCIN, TROUGH: VANCOMYCIN TR: 20.4 ug/mL — AB (ref 10.0–20.0)

## 2014-01-03 MED ORDER — DAKINS (1/4 STRENGTH) 0.125 % EX SOLN
Freq: Two times a day (BID) | CUTANEOUS | Status: AC
  Administered 2014-01-03 – 2014-01-04 (×3)
  Filled 2014-01-03: qty 473

## 2014-01-03 MED ORDER — SENNOSIDES-DOCUSATE SODIUM 8.6-50 MG PO TABS: 2.0000 | ORAL_TABLET | Freq: Every evening | ORAL | Status: DC | PRN

## 2014-01-03 MED ORDER — DIPHENHYDRAMINE HCL 50 MG PO CAPS
50.0000 mg | ORAL_CAPSULE | Freq: Four times a day (QID) | ORAL | Status: AC
  Administered 2014-01-03 – 2014-01-04 (×4): 50 mg via ORAL
  Filled 2014-01-03 (×4): qty 1

## 2014-01-03 MED ORDER — SACCHAROMYCES BOULARDII 250 MG PO CAPS
250.0000 mg | ORAL_CAPSULE | Freq: Two times a day (BID) | ORAL | Status: DC
  Administered 2014-01-03 – 2014-01-09 (×13): 250 mg via ORAL
  Filled 2014-01-03 (×14): qty 1

## 2014-01-03 NOTE — Progress Notes (Signed)
ANTIBIOTIC CONSULT NOTE   Pharmacy Consult for Vancomycin Indication: Surgical wound infection  Allergies  Allergen Reactions  . Amoxicillin Hives    Patient Measurements: Height: 5\' 10"  (177.8 cm) Weight: 195 lb (88.451 kg) IBW/kg (Calculated) : 68.5  Vital Signs: Temp: 98.5 F (36.9 C) (09/22 6063) Temp src: Oral (09/22 0160) BP: 108/58 mmHg (09/22 1093) Pulse Rate: 68 (09/22 0633) Intake/Output from previous day: 09/21 0701 - 09/22 0700 In: 2552.5 [P.O.:480; I.V.:1472.5; IV Piggyback:600] Out: 1375 [ATFTD:3220] Intake/Output from this shift: Total I/O In: 50 [I.V.:50] Out: 600 [Urine:600]  Labs:  Recent Labs  01/01/14 1402 01/02/14 0456 01/03/14 0515  WBC 12.4* 11.9* 11.4*  HGB 11.3* 10.5* 10.1*  PLT 410* 403* 505*  CREATININE 0.60 0.43* 0.52   Estimated Creatinine Clearance: 103.9 ml/min (by C-G formula based on Cr of 0.52). No results found for this basename: Letta Median, VANCORANDOM, GENTTROUGH, GENTPEAK, GENTRANDOM, TOBRATROUGH, TOBRAPEAK, TOBRARND, AMIKACINPEAK, AMIKACINTROU, AMIKACIN,  in the last 72 hours   Antiinfectives: 9/20 >> Vanc >>  9/20 >> Cipro  >> 9/20 >> Flagyl  >>   Assessment: 48yo F s/p surgery for SBO at [place] 3 days PTA, presents with abd pain near surgical scar, nausea, and worsening diffuse rash. Rash started before she began a 10 day course of Bactrim on 9/18. Cipro and Flagyl single doses ordered in the ED. Pharmacy is asked to dose Vancomycin for wound infection. Weight 93kg last month.  Today, 9/22:  Day #3 Vancomycin, cipro, flagyl  Tmax: 98.5  WBCs: 11.4  Renal: SCr 0.52, CrCl >100  Wound culture: ngtd  Vancomycin trough level at 1500 - pending   Goal of Therapy:  Vancomycin trough level 15-20 mcg/ml Appropriate antibiotic dosing for renal function; eradication of infection  Plan:   Continue Vancomycin 1g IV q8h.  Measure Vanc trough at steady state.  Follow up renal fxn and culture  results.   Gretta Arab PharmD, BCPS Pager [phone] 01/03/2014 11:19 AM

## 2014-01-03 NOTE — Progress Notes (Signed)
2 Days Post-Op  Subjective: Her rash is becoming more confluent face and chest.  Still has allot of rash  Abdomen and trunk also.  I pulled the dressing out without pain med, but we gave her some to repack.  She still has a little bit of an odor with dressing removal ,but no worse than yesterday, maybe better. Open site looks good.  Objective: Vital signs in last 24 hours: Temp:  [97.6 F (36.4 C)-98.5 F (36.9 C)] 98.5 F (36.9 C) (09/22 1638) Pulse Rate:  [60-69] 68 (09/22 0633) Resp:  [15-18] 15 (09/22 0633) BP: (102-118)/(54-60) 108/58 mmHg (09/22 0633) SpO2:  [92 %-98 %] 97 % (09/22 0633) Last BM Date: 12/30/13 480 PO Bariatric diet Afebrile, VSS WBC is still up, BMP OK Intake/Output from previous day: 09/21 0701 - 09/22 0700 In: 2552.5 [P.O.:480; I.V.:1472.5; IV Piggyback:600] Out: 1375 [Urine:1375] Intake/Output this shift:    General appearance: alert, cooperative, no distress and very anxious. Resp: clear to auscultation bilaterally GI: soft, few BS, rash all over, minimal odor to wound.  It looks very good.  Lab Results:   Recent Labs  01/02/14 0456 01/03/14 0515  WBC 11.9* 11.4*  HGB 10.5* 10.1*  HCT 30.5* 30.1*  PLT 403* 505*    BMET  Recent Labs  01/02/14 0456 01/03/14 0515  NA 138 140  K 4.3 3.7  CL 101 105  CO2 27 25  GLUCOSE 199* 114*  BUN 9 12  CREATININE 0.43* 0.52  CALCIUM 8.5 8.2*   PT/INR No results found for this basename: LABPROT, INR,  in the last 72 hours   Recent Labs Lab 01/01/14 1402  AST 21  ALT 29  ALKPHOS 123*  BILITOT 0.3  PROT 7.2  ALBUMIN 2.7*     Lipase  No results found for this basename: lipase     Studies/Results: Ct Abdomen Pelvis W Contrast  01/01/2014   CLINICAL DATA:  Recent small bowel obstruction now with right-sided pain at the incision site ; elevated white blood cell count.  EXAM: CT ABDOMEN AND PELVIS WITH CONTRAST  TECHNIQUE: Multidetector CT imaging of the abdomen and pelvis was performed  using the standard protocol following bolus administration of intravenous contrast.  CONTRAST:  169mL OMNIPAQUE IOHEXOL 300 MG/ML  SOLN  COMPARISON:  None.  FINDINGS: There is fluid and gas within the lower anterior abdominal and pelvic incision site. The is 7.5 cm AP x 8 cm transversely by 15.3 cm longitudinally. There is thickening of the inferior aspect of the right rectus muscle adjacent to the deep portion of this incisional abscess. No definite loop of involved bowel is identified. The surgical suture material within small bowel in the mid pelvis is at some distance from the inflammatory changes. The small bowel and large bowel gas pattern is normal. No free extraluminal gas collections within the peritoneal cavity are demonstrated. There is fluid in the right perirectal region which may reflect a mildly distended small bowel loop or could reflect an extraluminal collection. It contains no gas. It measures 4.5 x 3.1 x 4.5 cm.  The liver, gallbladder, pancreas, adrenal glands, and kidneys are unremarkable. 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 There is at least 1 tiny accessory spleen present. 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 The caliber of the abdominal aorta is normal. The periaortic and pericaval regions are normal.  The lumbar vertebral bodies are preserved in height. There is disc  space narrowing the bony pelvis is unremarkable. The lung bases are clear.  IMPRESSION: 1. There is an incisional abscess in the anterior abdominal wall containing fluid and gas. It extends from the peritoneal cavity to the skin surface. There is mild edema within the adjacent rectus muscle inferiorly. A smaller fluid collection versus an unopacified mildly dilated small bowel loop is noted in the right perirectal region. 2. The small and large bowel gas pattern is unremarkable. 3. No acute visceral abnormality is  demonstrated elsewhere.   Electronically Signed   By: Brittany  [name]   On: 01/01/2014 15:01    Medications: . ciprofloxacin  400 mg Intravenous Q12H  . clonazePAM  1 mg Oral QHS  . DULoxetine  30 mg Oral Daily  . enoxaparin (LOVENOX) injection  40 mg Subcutaneous Q24H  . loratadine  10 mg Oral Daily  . metronidazole  500 mg Intravenous 3 times per day  . multivitamin with minerals  1 tablet Oral Daily  . OXcarbazepine  150 mg Oral BID  . pseudoephedrine  120 mg Oral BID  . traZODone  50 mg Oral QHS  . vancomycin  1,000 mg Intravenous Q8H    Assessment/Plan 1.  S/p SBO with emergent laparotomy, necrotic bowel resection 12/24/13,  [place].  2.  Post-operative wound abscess, I&D subcutaneous and fascial tissue wound  abscess, 01/01/14, Dr. [name]   Prior LAPAROSCOPIC ROUX-EN-Y GASTRIC; Surgeon: Brittany Medal, MD 07/05/12.  3.  Hypertension  4.  Migraines  5.  Sleep apnea, no CPAP  6.  Hx of hiatal hernia and GERD  7.  Severe recurrent major depression without psychotic features, PTSD,  hospitalize, 8/11-8/19/15 for this.  8.  Arthritis  9.  Body mass index is 27.98 ( she lost 120 pounds with Roux)  10.  Dyslipidemia  Plan:  I think if it were not for the skin rash we could put a wound Vac in it.  I am going to give her a day of Dakin's, and see how she does.  I don't really think it would be wise to add all that adhesive on top of her rash.  Day 3 of vancomycin and flagyl.  Add probiotic, Senakot if no BM today.    LOS: 2 days    Brittany [name] 01/03/2014

## 2014-01-03 NOTE — Consult Note (Signed)
Reviewed records, CCS ordered Dakins for now and does not feel VAC would be good for this patients skin at this time due to extensive rash.  Please re consult if NPWT VAC needed when appropriate.  Kaven Cumbie Worth RN,CWOCN [phone]

## 2014-01-03 NOTE — Care Management Note (Addendum)
    [place] 1 of 2   01/09/2014     3:54:33 PM CARE MANAGEMENT NOTE 01/09/2014  Patient:  Brittany [name], Brittany [name]   Account Number:  [id number]  Date Initiated:  01/03/2014  Documentation initiated by:  Sunday Spillers  Subjective/Objective Assessment:   48 yo female admitted with surgical wound abscess. PTA lived at home with spouse.     Action/Plan:   Home when stable   Anticipated DC Date:  01/06/2014   Anticipated DC Plan:  [place]  CM consult      [facility] Choice  HOME HEALTH   Choice offered to / List presented to:  C-1 Patient   DME arranged  VAC      DME agency  [facility]     [place] arranged  HH-1 RN  [place].   Status of service:  Completed, signed off Medicare Important Message given?   (If response is "NO", the following Medicare IM given date fields will be blank) Date Medicare IM given:   Medicare IM given by:   Date Additional Medicare IM given:   Additional Medicare IM given by:    Discharge Disposition:  [place]  Per UR Regulation:  Reviewed for med. necessity/level of care/duration of stay  If discussed at [place] of Stay Meetings, dates discussed:    Comments:  01-03-14 Sunday Spillers RN CM 1500 Spoke with patient at bedside, no preference for [facility] services. Contacted [facility] and they will follow for d/c needs, currently BID dressing changes. Awaiting final orders.

## 2014-01-03 NOTE — Progress Notes (Signed)
PHARMACY - VANCOMYCIN (brief note)  Day #3 Vancomycin for surgical wound infection.  Also on cipro and flagyl.  Vancomycin Trough level = 20.4 mcg/ml (goal 15-20 mcg/ml) on regimen of Vancomycin 1gm IV q8h  Wound culture - NG to date  Assessment:  Vancomycin trough level at upper end of goal  PLAN:  Continue Vancomycin 1gm IV q8h              Follow renal function; recheck Vanc level if Scr significantly changes              Follow up cultures/sensitivities  Leone Haven, PharmD 01/03/14 @ 16:18

## 2014-01-04 ENCOUNTER — Other Ambulatory Visit (HOSPITAL_COMMUNITY): Payer: 59

## 2014-01-04 DIAGNOSIS — M1990 Unspecified osteoarthritis, unspecified site: Secondary | ICD-10-CM | POA: Diagnosis present

## 2014-01-04 DIAGNOSIS — D649 Anemia, unspecified: Secondary | ICD-10-CM

## 2014-01-04 DIAGNOSIS — E785 Hyperlipidemia, unspecified: Secondary | ICD-10-CM | POA: Diagnosis present

## 2014-01-04 DIAGNOSIS — Z9049 Acquired absence of other specified parts of digestive tract: Secondary | ICD-10-CM

## 2014-01-04 DIAGNOSIS — K219 Gastro-esophageal reflux disease without esophagitis: Secondary | ICD-10-CM | POA: Diagnosis present

## 2014-01-04 DIAGNOSIS — T8140XA Infection following a procedure, unspecified, initial encounter: Principal | ICD-10-CM

## 2014-01-04 DIAGNOSIS — F332 Major depressive disorder, recurrent severe without psychotic features: Secondary | ICD-10-CM

## 2014-01-04 DIAGNOSIS — G4733 Obstructive sleep apnea (adult) (pediatric): Secondary | ICD-10-CM | POA: Diagnosis present

## 2014-01-04 DIAGNOSIS — I10 Essential (primary) hypertension: Secondary | ICD-10-CM | POA: Diagnosis present

## 2014-01-04 DIAGNOSIS — R21 Rash and other nonspecific skin eruption: Secondary | ICD-10-CM | POA: Diagnosis present

## 2014-01-04 LAB — CBC
HEMATOCRIT: 33.3 % — AB (ref 36.0–46.0)
Hemoglobin: 11.2 g/dL — ABNORMAL LOW (ref 12.0–15.0)
MCH: 32.4 pg (ref 26.0–34.0)
MCHC: 33.6 g/dL (ref 30.0–36.0)
MCV: 96.2 fL (ref 78.0–100.0)
Platelets: 600 10*3/uL — ABNORMAL HIGH (ref 150–400)
RBC: 3.46 MIL/uL — AB (ref 3.87–5.11)
RDW: 13 % (ref 11.5–15.5)
WBC: 9.6 10*3/uL (ref 4.0–10.5)

## 2014-01-04 LAB — BASIC METABOLIC PANEL
Anion gap: 10 (ref 5–15)
BUN: 9 mg/dL (ref 6–23)
CHLORIDE: 104 meq/L (ref 96–112)
CO2: 24 meq/L (ref 19–32)
Calcium: 8 mg/dL — ABNORMAL LOW (ref 8.4–10.5)
Creatinine, Ser: 0.62 mg/dL (ref 0.50–1.10)
GFR calc Af Amer: >90 mL/min (ref >90)
Glucose, Bld: 104 mg/dL — ABNORMAL HIGH (ref 70–99)
POTASSIUM: 3.8 meq/L (ref 3.7–5.3)
SODIUM: 138 meq/L (ref 137–147)

## 2014-01-04 MED ORDER — PREDNISONE 50 MG PO TABS
50.0000 mg | ORAL_TABLET | Freq: Every day | ORAL | Status: AC
  Administered 2014-01-04 – 2014-01-06 (×3): 50 mg via ORAL
  Filled 2014-01-04 (×3): qty 1
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
## 2014-01-04 NOTE — Progress Notes (Signed)
3 Days Post-Op  Subjective: Her rash is worse, benadryl is not improving and neither is time.  I am going to ask Medicine to see.  Objective: Vital signs in last 24 hours: Temp:  [98.1 F (36.7 C)-98.6 F (37 C)] 98.1 F (36.7 C) (09/23 0517) Pulse Rate:  [74-83] 83 (09/23 0517) Resp:  [16-18] 16 (09/23 0517) BP: (101-119)/(54-63) 101/63 mmHg (09/23 0517) SpO2:  [94 %-97 %] 94 % (09/23 0517) Last BM Date: 12/31/13 Nothing PO recorded. Bariatric diet Afebrile, VSS Labs OK Intake/Output from previous day: 09/22 0701 - 09/23 0700 In: 1879.6 [I.V.:1279.6; IV Piggyback:600] Out: 2000 [Urine:2000] Intake/Output this shift:    General appearance: alert, cooperative and no distress Resp: clear to auscultation bilaterally Cardio: regular rate and rhythm, S1, S2 normal, no murmur, click, rub or gallop Skin: Rash on face upper chest is just as red as before.  the rash lower body from the abdomen down are not improving, and are worse, more confulentl Open abdominal wound is better.  It remains clean, no odor this AM.  Some drainage at the base, but otherwise looks good. Lab Results:   Recent Labs  01/03/14 0515 01/04/14 0455  WBC 11.4* 9.6  HGB 10.1* 11.2*  HCT 30.1* 33.3*  PLT 505* 600*    BMET  Recent Labs  01/03/14 0515 01/04/14 0455  NA 140 138  K 3.7 3.8  CL 105 104  CO2 25 24  GLUCOSE 114* 104*  BUN 12 9  CREATININE 0.52 0.62  CALCIUM 8.2* 8.0*   PT/INR No results found for this basename: LABPROT, INR,  in the last 72 hours   Recent Labs Lab 01/01/14 1402  AST 21  ALT 29  ALKPHOS 123*  BILITOT 0.3  PROT 7.2  ALBUMIN 2.7*     Lipase  No results found for this basename: lipase     Studies/Results: No results found.  Medications: . ciprofloxacin  400 mg Intravenous Q12H  . clonazePAM  1 mg Oral QHS  . DULoxetine  30 mg Oral Daily  . enoxaparin (LOVENOX) injection  40 mg Subcutaneous Q24H  . loratadine  10 mg Oral Daily  . metronidazole  500  mg Intravenous 3 times per day  . multivitamin with minerals  1 tablet Oral Daily  . OXcarbazepine  150 mg Oral BID  . pseudoephedrine  120 mg Oral BID  . saccharomyces boulardii  250 mg Oral BID  . sodium hypochlorite   Irrigation BID  . traZODone  50 mg Oral QHS  . vancomycin  1,000 mg Intravenous Q8H    Assessment/Plan 1. S/p SBO with emergent laparotomy, necrotic bowel resection 12/24/13, [place].  2. Post-operative wound abscess, I&D subcutaneous and fascial tissue wound abscess, 01/01/14, Dr. [name]  Prior LAPAROSCOPIC ROUX-EN-Y GASTRIC; Surgeon: [name], MD 07/05/12.  3. Hypertension  4. Migraines  5. Sleep apnea, no CPAP  6. Hx of hiatal hernia and GERD  7. Severe recurrent major depression without psychotic features, PTSD, hospitalize, 8/11-8/19/15 for this.  8. Arthritis  9. Body mass index is 27.98 ( she lost 120 pounds with Roux)  10. Dyslipidemia 11. Severe progressive rash previously attributed to preadmission Bactrim   Plan:  Continue wet to dry dressing,  Multiple organisms on the culture.  I think we should consider stopping the Vancomycin, will discuss with Dr. Hassell Done.  We may be able to stop all of them.  I have as dr. Algis Liming to see her and help with this rash ED notes  reviewed and they say rash started 4 days prior to coming to our ED, and before Bactrim and Oxycodone.    LOS: 3 days    Chin Wachter 01/04/2014

## 2014-01-04 NOTE — Progress Notes (Signed)
I talked with Dr. Hassell Done and I am stopping antibiotics for now.  I know ID plans to see and we can follow their recommendation.  I will look for recommendations later or you can [place] me at [phone].   Appreciate Dr. Janelle Floor assistance.

## 2014-01-04 NOTE — Consult Note (Signed)
[facility]    Date of Admission:  01/01/2014          Reason for Consult: Progressive rash    Referring Physician: Dr. 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   Severe recurrent major depression without psychotic features   PTSD (post-traumatic stress disorder)   Normocytic anemia   DJD (degenerative joint disease)   Hypertension   Dyslipidemia   GERD (gastroesophageal reflux disease)   Obstructive sleep apnea   . clonazePAM  1 mg Oral QHS  . DULoxetine  30 mg Oral Daily  . enoxaparin (LOVENOX) injection  40 mg Subcutaneous Q24H  . loratadine  10 mg Oral Daily  . multivitamin with minerals  1 tablet Oral Daily  . OXcarbazepine  150 mg Oral BID  . pseudoephedrine  120 mg Oral BID  . saccharomyces boulardii  250 mg Oral BID  . sodium hypochlorite   Irrigation BID  . traZODone  50 mg Oral QHS    Recommendations: 1. Agree with stopping vancomycin, ciprofloxacin and metronidazole 2. Consider brief course of prednisone 3. Discuss recently started medications (Lamictal, Trileptal, Klonopin and trazodone) with psychiatry  Assessment: I strongly suspect that her progressive rash is due to a drug eruption. Sulfa hypersensitivity is still possible. Sulfa antibiotics can cause severe reactions that often continue to worsen even after the offending agent has been stopped and then only improve slowly. However, she could also be having a hypersensitivity reaction to one of the 4 new psych medications she was started on last month. Of those Trileptal and Lamictal are probably the most likely offenders. Her fevers have resolved and her wound is now open and clean so I agree with stopping her empiric antibiotic therapy at this time. I would consider a  brief burst of steroids if okay with general surgery. I would also discuss management of her recently started medications with psychiatry. If her rash continues to worsen I would consider holding some or all of those medications. She does note significant improvement in her depression since starting these medications. I will followup tomorrow.   HPI: Brittany [name] is a 48 y.o. female who was hospitalized last month at [facility] for recent worsening of her chronic depression and anxiety. She was continued on Cymbalta and started on Lamictal, Trileptal, Klonopin and trazodone. She had gradual improvement in her mood and was transitioned from inpatient to outpatient therapy. Her outpatient therapy was interrupted by sudden onset of severe abdominal pain while she was in [place], [place]. She was hospitalized there on September 12 and was found to have small bowel obstruction with necrotic small bowel. She underwent emergent laparotomy with partial small bowel resection. She recalls being on intravenous antibiotics. Postoperatively she developed fevers and was told that there was a fluid collection in her abdomen. Rather than drain the collection and they decided to continue antibiotic therapy as an outpatient with a sulfa antibiotic. She recalls starting that on the day of her discharge on September 17. She continued to have fever and chills and began to develop a diffuse red, pruritic rash after discharge. She also noted increased pain and swelling around her surgical incision leading to readmission here on September 20. She  took her last dose of sulfa antibiotic that morning.  She was found to have a large wound abscess and underwent incision and drainage on the day of admission. Operative Gram stain and cultures revealed multiple organisms. She has been treated with vancomycin, ciprofloxacin and metronidazole but these were stopped today because of progressive rash. Her fever and  chills have resolved.   Review of Systems: Constitutional: positive for chills, fevers and malaise, negative for sweats and weight loss Eyes: negative Ears, nose, mouth, throat, and face: negative Respiratory: negative Cardiovascular: negative Gastrointestinal: negative Genitourinary:negative Integument/breast: positive for pruritus and rash  Past Medical History  Diagnosis Date  . Migraine   . Hypertension   . Hypercholesterolemia   . Depression   . Morbid obesity   . Sleep apnea     no CPAP machine   . H/O hiatal hernia   . GERD (gastroesophageal reflux disease)     hx of   . Arthritis     hips, knees and hands   . Anxiety   . PTSD (post-traumatic stress disorder)     History  Substance Use Topics  . Smoking status: Former Smoker    Types: Cigars    Quit date: 04/27/2004  . Smokeless tobacco: Never Used  . Alcohol Use: Yes     Comment: Occasional use    Family History  Problem Relation Age of Onset  . Cancer Mother     colon  . Cancer Maternal Aunt     breast  . Depression Father   . Depression Sister   . Alcohol abuse Paternal Uncle   . Bipolar disorder Cousin    Allergies  Allergen Reactions  . Amoxicillin Hives    OBJECTIVE: Blood pressure 101/63, pulse 83, temperature 98.1 F (36.7 C), temperature source Oral, resp. rate 16, height 5\' 10"  (1.778 m), weight 195 lb (88.451 kg), SpO2 94.00%. General: She is alert and uncomfortable due to pruritus Skin: Diffuse, red maculopapular rash with slightly raised borders Eyes: Normal external exam Oral: No oropharyngeal lesions Lungs: Clear Cor: Regular S1 and S2 with no murmurs Abdomen: Soft with only minimal tenderness. She has a clean gauze dressing on her incision Mood and affect: Appropriate  Lab Results Lab Results  Component Value Date   WBC 9.6 01/04/2014   HGB 11.2* 01/04/2014   HCT 33.3* 01/04/2014   MCV 96.2 01/04/2014   PLT 600* 01/04/2014    Lab Results  Component Value Date    CREATININE 0.62 01/04/2014   BUN 9 01/04/2014   NA 138 01/04/2014   K 3.8 01/04/2014   CL 104 01/04/2014   CO2 24 01/04/2014    Lab Results  Component Value Date   ALT 29 01/01/2014   AST 21 01/01/2014   ALKPHOS 123* 01/01/2014   BILITOT 0.3 01/01/2014     Microbiology: Recent Results (from the past 240 hour(s))  CULTURE, ROUTINE-ABSCESS     Status: None   Collection Time    01/01/14  5:13 PM      Result Value Ref Range Status   Specimen Description ABSCESS MIDLINE INCISION   Final   Special Requests NONE   Final   Gram Stain     Final   Value: FEW WBC PRESENT,BOTH PMN AND MONONUCLEAR     NO SQUAMOUS EPITHELIAL CELLS SEEN     FEW GRAM POSITIVE COCCI     IN PAIRS IN CLUSTERS FEW GRAM NEGATIVE RODS     Performed at [facility]  Final   Value: MULTIPLE ORGANISMS PRESENT, NONE PREDOMINANT     Performed at [facility]   Report Status PENDING   Incomplete  ANAEROBIC CULTURE     Status: None   Collection Time    01/01/14  5:13 PM      Result Value Ref Range Status   Specimen Description ABSCESS MIDLINE INCISION   Final   Special Requests NONE   Final   Gram Stain     Final   Value: FEW WBC PRESENT,BOTH PMN AND MONONUCLEAR     NO SQUAMOUS EPITHELIAL CELLS SEEN     FEW GRAM POSITIVE COCCI     IN PAIRS IN CLUSTERS FEW GRAM NEGATIVE RODS     Performed at [facility]   Culture     Final   Value: NO ANAEROBES ISOLATED; CULTURE IN PROGRESS FOR 5 DAYS     Performed at [facility]   Report Status PENDING   Incomplete    Michel Bickers, MD [facility] [phone] pager   [phone] cell 01/04/2014, 2:07 PM

## 2014-01-04 NOTE — Consult Note (Addendum)
[facility] Medical Consultation  Brittany [name] YQI:[id number] DOB: Feb 16, 1966 DOA: 01/01/2014 PCP: Guadlupe Spanish, MD   Requesting physician: Dr. Johnathan Hausen, CCS Date of consultation: 01/04/2014 Reason for consultation: Evaluation & Mx of skin rash.  Impression/Recommendations Active Problems:   Abdominal abscess   Wound infection after surgery    1. Skin rash: Highly suspicious for drug eruption. She started sulfa on 9/19, noticed beginning of skin rash on her upper extremities the next day and stopped taking antibiotics on 9/20. Rashes however progressed to involve almost all of her body. Since admission, patient has been on IV vancomycin, Cipro and Flagyl. They were new psychiatric medications started recently (Lamictal, Trileptal, Klonopin and trazodone). DD for her skin rash includes-sulfa drugs still a possibility despite having stopped it 3 days ago, psych medications-Trileptal and Lamictal, narcotics-only morphine is new. She denies any new environmental exposures. Discussed with surgery-discontinuing antibiotics for now and trial of prednisone for a couple of days as discussed with him and recommended by infectious disease. Consulted psychiatry to evaluate medication management given above scenario. Monitor closely. 2. Anemia: Stable 3. Anxiety and depression: Psychiatry consulted. 4. Hypertension: Controlled off meds.  I will followup again tomorrow. Please contact me if I can be of assistance in the meanwhile. Thank you for this consultation.  Chief Complaint: Worsening skin rash.  HPI:  48 year old female with history of HTN, hypercholesterolemia, depression, morbid obesity, OSA, GERD, s/p emergent laparotomy for necrotic bowel resection 12/24/13 at [facility], admitted by surgery on 01/01/14 for postoperative wound abscess, underwent I&D. She first noticed skin rash on her upper extremities on 9/20. She had started Bactrim the day before. She stopped Bactrim on 9/20.  Since admission here, she has noticed a rash progressively worsening in distribution involving almost the entire body with associated pruritus. She denies pain in the eyes or mouth. No dyspnea or wheezing.   Review of Systems:  All systems reviewed and apart from history of presenting illness, negative  Past Medical History  Diagnosis Date  . Migraine   . Hypertension   . Hypercholesterolemia   . Depression   . Morbid obesity   . Sleep apnea     no CPAP machine   . H/O hiatal hernia   . GERD (gastroesophageal reflux disease)     hx of   . Arthritis     hips, knees and hands   . Anxiety   . PTSD (post-traumatic stress disorder)    Past Surgical History  Procedure Laterality Date  . Foot surgery    . Breath tek h pylori  05/03/2012    Procedure: BREATH TEK H PYLORI;  Surgeon: [name], MD;  Location: [name];  Service: General;  Laterality: N/A;  . Right tube and ovary removed     . Abdominal hysterectomy  1997 & 2006  . Colonoscopy      hx of benign polyps   . 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 Upper gi endoscopy N/A 07/05/2012    Procedure: UPPER GI ENDOSCOPY;  Surgeon: [name], MD;  Location: [facility];  Service: General;  Laterality: N/A;  . Laparoscopic lysis of adhesions N/A 07/05/2012    Procedure: LAPAROSCOPIC LYSIS OF ADHESIONS;  Surgeon: [name], MD;  Location: [facility];  Service: General;  Laterality: N/A;  repair of abdominal wall hernia  . Irrigation and debridement  abscess N/A 01/01/2014    Procedure: IRRIGATION AND DEBRIDEMENT ABSCESS;  Surgeon: [name], MD;  Location: [facility];  Service: General;  Laterality: N/A;   Social History:  reports that she quit smoking about 9 years ago. Her smoking use included Cigars. She has never used smokeless tobacco. She reports that she drinks alcohol. She reports that she  does not use illicit drugs.  Allergies  Allergen Reactions  . Amoxicillin Hives   Family History  Problem Relation Age of Onset  . Cancer Mother     colon  . Cancer Maternal Aunt     breast  . Depression Father   . Depression Sister   . Alcohol abuse Paternal Uncle   . Bipolar disorder Cousin     Prior to Admission medications   Medication Sig Start Date End Date Taking? Authorizing Provider  acetaminophen (TYLENOL) 500 MG tablet Take 1 tablet (500 mg total) by mouth every 6 (six) hours as needed (pain.). 11/30/13  Yes Encarnacion Slates, NP  clonazePAM (KLONOPIN) 1 MG tablet Take 1 tablet (1 mg total) by mouth at bedtime. For anxiety 12/06/13  Yes Leonides Grills, MD  DULoxetine (CYMBALTA) 30 MG capsule Take 1 capsule (30 mg total) by mouth daily. For depression 11/30/13  Yes Encarnacion Slates, NP  loratadine-pseudoephedrine (ALAVERT ALLERGY/SINUS) 5-120 MG per tablet Take 1 tablet by mouth 2 (two) times daily.   Yes Historical Provider, MD  Multiple Vitamin (MULTIVITAMIN WITH MINERALS) TABS tablet Take 1 tablet by mouth daily. For low vitamin 11/30/13  Yes Encarnacion Slates, NP  OXcarbazepine (TRILEPTAL) 150 MG tablet Take 1 tablet (150 mg total) by mouth 2 (two) times daily. For mood stabilization 11/30/13  Yes Encarnacion Slates, NP  oxyCODONE-acetaminophen (PERCOCET/ROXICET) 5-325 MG per tablet Take 1-2 tablets by mouth every 4 (four) hours as needed for moderate pain or severe pain.   Yes Historical Provider, MD  sulfamethoxazole-trimethoprim (BACTRIM DS) 800-160 MG per tablet Take 1 tablet by mouth 2 (two) times daily.   Yes Historical Provider, MD  traZODone (DESYREL) 50 MG tablet Take 1 tablet (50 mg total) by mouth at bedtime. For sleep 11/30/13  Yes Encarnacion Slates, NP   Physical Exam: Blood pressure 101/63, pulse 83, temperature 98.1 F (36.7 C), temperature source Oral, resp. rate 16, height 5\' 10"  (1.778 m), weight 88.451 kg (195 lb), SpO2 94.00%. Filed Vitals:   01/04/14 0517  BP: 101/63   Pulse: 83  Temp: 98.1 F (36.7 C)  Resp: 16     General:  Moderately built and nourished female patient lying comfortably in bed.  Eyes:  Pupils equally reacting to light and accommodation. Extraocular muscle movements intact.  ENT:  No acute findings.  Neck:  Supple. No JVD, carotid bruit or thyromegaly.  Cardiovascular:  S1 and S2 heard, RRR. No JVD, murmurs or pedal edema.  Respiratory:  Clear to auscultation. No increased work of breathing.  Abdomen:  Nondistended and soft. Midline surgical dressing clean and dry. Appropriate mild tenderness in the middle quadrant but without peritoneal signs. Normal bowel sounds heard. No organomegaly or masses appreciated.  Skin:  Diffuse maculopapular rash involving face, neck, front and back of trunk and all extremities, pruritic and coalesced in many areas of trunk.   Musculoskeletal:  No acute findings  Psychiatric:  Pleasant and cooperative.  Neurologic: Alert and oriented. No focal deficits.  Labs on Admission:  Basic Metabolic Panel:  Recent Labs Lab 01/01/14 1402 01/02/14 0456 01/03/14 0515 01/04/14 7253  NA 136* 138 140 138  K 3.8 4.3 3.7 3.8  CL 94* 101 105 104  CO2 29 27 25 24   GLUCOSE 111* 199* 114* 104*  BUN 11 9 12 9   CREATININE 0.60 0.43* 0.52 0.62  CALCIUM 8.9 8.5 8.2* 8.0*   Liver Function Tests:  Recent Labs Lab 01/01/14 1402  AST 21  ALT 29  ALKPHOS 123*  BILITOT 0.3  PROT 7.2  ALBUMIN 2.7*   No results found for this basename: LIPASE, AMYLASE,  in the last 168 hours No results found for this basename: AMMONIA,  in the last 168 hours CBC:  Recent Labs Lab 01/01/14 1402 01/02/14 0456 01/03/14 0515 01/04/14 0455  WBC 12.4* 11.9* 11.4* 9.6  NEUTROABS 11.3*  --   --   --   HGB 11.3* 10.5* 10.1* 11.2*  HCT 32.5* 30.5* 30.1* 33.3*  MCV 93.1 95.0 95.9 96.2  PLT 410* 403* 505* 600*   Cardiac Enzymes: No results found for this basename: CKTOTAL, CKMB, CKMBINDEX, TROPONINI,  in the last 168  hours BNP: No components found with this basename: POCBNP,  CBG: No results found for this basename: GLUCAP,  in the last 168 hours  Radiological Exams on Admission: No results found.    Time spent: 60 minutes.  [facility] Pager [phone]  If 7PM-7AM, please contact night-coverage [email or web address] Password [email or web address] 01/04/2014, 11:00 AM

## 2014-01-05 ENCOUNTER — Other Ambulatory Visit (HOSPITAL_COMMUNITY): Payer: 59

## 2014-01-05 DIAGNOSIS — F4310 Post-traumatic stress disorder, unspecified: Secondary | ICD-10-CM

## 2014-01-05 DIAGNOSIS — T889XXS Complication of surgical and medical care, unspecified, sequela: Secondary | ICD-10-CM

## 2014-01-05 DIAGNOSIS — I10 Essential (primary) hypertension: Secondary | ICD-10-CM

## 2014-01-05 LAB — BASIC METABOLIC PANEL
Anion gap: 12 (ref 5–15)
BUN: 10 mg/dL (ref 6–23)
CO2: 23 meq/L (ref 19–32)
CREATININE: 0.48 mg/dL — AB (ref 0.50–1.10)
Calcium: 8.5 mg/dL (ref 8.4–10.5)
Chloride: 104 mEq/L (ref 96–112)
GFR calc Af Amer: >90 mL/min (ref >90)
GFR calc non Af Amer: >90 mL/min (ref >90)
GLUCOSE: 139 mg/dL — AB (ref 70–99)
Potassium: 4.1 mEq/L (ref 3.7–5.3)
Sodium: 139 mEq/L (ref 137–147)

## 2014-01-05 LAB — CULTURE, ROUTINE-ABSCESS

## 2014-01-05 LAB — CBC
HEMATOCRIT: 32.3 % — AB (ref 36.0–46.0)
HEMOGLOBIN: 11 g/dL — AB (ref 12.0–15.0)
MCH: 32.1 pg (ref 26.0–34.0)
MCHC: 34.1 g/dL (ref 30.0–36.0)
MCV: 94.2 fL (ref 78.0–100.0)
Platelets: 634 10*3/uL — ABNORMAL HIGH (ref 150–400)
RBC: 3.43 MIL/uL — AB (ref 3.87–5.11)
RDW: 13.1 % (ref 11.5–15.5)
WBC: 9.8 10*3/uL (ref 4.0–10.5)

## 2014-01-05 MED ORDER — QUETIAPINE FUMARATE 25 MG PO TABS
25.0000 mg | ORAL_TABLET | Freq: Three times a day (TID) | ORAL | Status: DC
  Administered 2014-01-05 – 2014-01-09 (×12): 25 mg via ORAL
  Filled 2014-01-05 (×14): qty 1
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
## 2014-01-05 NOTE — Progress Notes (Signed)
4 Days Post-Op  Subjective: She is kind of emotional today, rash is maybe a little better face and chest, worse lower trunk and abdomen.    Objective: Vital signs in last 24 hours: Temp:  [97.8 F (36.6 C)-99 F (37.2 C)] 98.1 F (36.7 C) (09/24 0534) Pulse Rate:  [64-77] 64 (09/24 0534) Resp:  [16-18] 16 (09/24 0534) BP: (111-118)/(56-70) 111/56 mmHg (09/24 0534) SpO2:  [94 %-97 %] 97 % (09/24 0534) Last BM Date: 01/04/14 240 PO Afebrile VSS Labs OK  Intake/Output from previous day: 09/23 0701 - 09/24 0700 In: 1840 [P.O.:240; I.V.:1200; IV Piggyback:400] Out: 1600 [Urine:1600] Intake/Output this shift: Total I/O In: 200 [I.V.:200] Out: -   General appearance: alert, cooperative and teary Resp: clear to auscultation bilaterally GI: open wound looks great.  rash is still one of her major issues.  Lab Results:   Recent Labs  01/04/14 0455 01/05/14 0545  WBC 9.6 9.8  HGB 11.2* 11.0*  HCT 33.3* 32.3*  PLT 600* 634*    BMET  Recent Labs  01/04/14 0455 01/05/14 0545  NA 138 139  K 3.8 4.1  CL 104 104  CO2 24 23  GLUCOSE 104* 139*  BUN 9 10  CREATININE 0.62 0.48*  CALCIUM 8.0* 8.5   PT/INR No results found for this basename: LABPROT, INR,  in the last 72 hours   Recent Labs Lab 01/01/14 1402  AST 21  ALT 29  ALKPHOS 123*  BILITOT 0.3  PROT 7.2  ALBUMIN 2.7*     Lipase  No results found for this basename: lipase     Studies/Results: No results found.  Medications: . clonazePAM  1 mg Oral QHS  . DULoxetine  30 mg Oral Daily  . enoxaparin (LOVENOX) injection  40 mg Subcutaneous Q24H  . loratadine  10 mg Oral Daily  . multivitamin with minerals  1 tablet Oral Daily  . OXcarbazepine  150 mg Oral BID  . predniSONE  50 mg Oral Daily  . pseudoephedrine  120 mg Oral BID  . saccharomyces boulardii  250 mg Oral BID  . traZODone  50 mg Oral QHS    Assessment/Plan 1. S/p SBO with emergent laparotomy, necrotic bowel resection 12/24/13, [place].  2. Post-operative wound abscess, I&D subcutaneous and fascial tissue wound abscess, 01/01/14, Dr. [name]  Prior LAPAROSCOPIC ROUX-EN-Y GASTRIC; Surgeon: [name], MD 07/05/12.  3. Hypertension  4. Migraines  5. Sleep apnea, no CPAP  6. Hx of hiatal hernia and GERD  7. Severe recurrent major depression without psychotic features, PTSD, hospitalize, 8/11-8/19/15 for this.  8. Arthritis  9. Body mass index is 27.98 ( she lost 120 pounds with Roux)  10. Dyslipidemia  11. Severe progressive rash previously attributed to preadmission Bactrim   Plan:  I appreciate Dr. Megan Salon, [facility], and Psych for their assistance.  Her wound looks good.  I think we may get her home soon, with wet to dry dressings.  This rash needs to be much better before we can do a wound vac.i am stopping Dakin's solution.  LOS: 4 days    Brittany [name] 01/05/2014

## 2014-01-05 NOTE — Progress Notes (Signed)
Patient ID: Brittany [name], female   DOB: 1965-09-16, 48 y.o.   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   Severe recurrent major depression without psychotic features   PTSD (post-traumatic stress disorder)   Normocytic anemia   DJD (degenerative joint disease)   Hypertension   Dyslipidemia   GERD (gastroesophageal reflux disease)   Obstructive sleep apnea   . clonazePAM  1 mg Oral QHS  . DULoxetine  30 mg Oral Daily  . enoxaparin (LOVENOX) injection  40 mg Subcutaneous Q24H  . loratadine  10 mg Oral Daily  . multivitamin with minerals  1 tablet Oral Daily  . OXcarbazepine  150 mg Oral BID  . predniSONE  50 mg Oral Daily  . pseudoephedrine  120 mg Oral BID  . saccharomyces boulardii  250 mg Oral BID  . traZODone  50 mg Oral QHS    Subjective: She feels like the rash on her face and arms has improved slightly over the past 24 hours. She has been off Lamictal since admission and is due to stop Trileptal today. She is worried about relapse of her depression. Review of Systems: Pertinent items are noted in HPI.  Past Medical History  Diagnosis Date  . Migraine   . Hypertension   . Hypercholesterolemia   . Depression   . Morbid obesity   . Sleep apnea     no CPAP machine   . H/O hiatal hernia   . GERD (gastroesophageal reflux disease)     hx of   . Arthritis     hips, knees and hands   . Anxiety   . PTSD (post-traumatic stress disorder)     History  Substance Use Topics  . Smoking status: Former Smoker    Types: Cigars    Quit date: 04/27/2004  . Smokeless tobacco: Never Used  . Alcohol Use: Yes     Comment: Occasional use     Family History  Problem Relation Age of Onset  . Cancer Mother     colon  . Cancer Maternal Aunt     breast  . Depression Father   . Depression Sister   . Alcohol abuse Paternal Uncle   . Bipolar disorder Cousin     Allergies  Allergen Reactions  . Amoxicillin Hives    Objective: Temp:  [97.5 F (36.4 C)-98.1 F (36.7 C)] 97.5 F (36.4 C) (09/24 1400) Pulse Rate:  [64-73] 70 (09/24 1400) Resp:  [16] 16 (09/24 1400) BP: (111-123)/(56-70) 123/64 mmHg (09/24 1400) SpO2:  [93 %-97 %] 93 % (09/24 1400)  General: She is slightly anxious and tearful at times Skin: Her rash has faded some on her face and arms. There is no change in the rash on her trunk and lower extremities Eyes: Normal external exam Oral: No oropharyngeal lesions Abdomen: Abdominal dressing intact  Lab Results Lab Results  Component Value Date   WBC 9.8 01/05/2014   HGB 11.0* 01/05/2014   HCT 32.3* 01/05/2014   MCV 94.2 01/05/2014   PLT 634* 01/05/2014  Lab Results  Component Value Date   CREATININE 0.48* 01/05/2014   BUN 10 01/05/2014   NA 139 01/05/2014   K 4.1 01/05/2014   CL 104 01/05/2014   CO2 23 01/05/2014    Lab Results  Component Value Date   ALT 29 01/01/2014   AST 21 01/01/2014   ALKPHOS 123* 01/01/2014   BILITOT 0.3 01/01/2014      Microbiology: Recent Results (from the past 240 hour(s))  CULTURE, ROUTINE-ABSCESS     Status: None   Collection Time    01/01/14  5:13 PM      Result Value Ref Range Status   Specimen Description ABSCESS MIDLINE INCISION   Final   Special Requests NONE   Final   Gram Stain     Final   Value: FEW WBC PRESENT,BOTH PMN AND MONONUCLEAR     NO SQUAMOUS EPITHELIAL CELLS SEEN     FEW GRAM POSITIVE COCCI     IN PAIRS IN CLUSTERS FEW GRAM NEGATIVE RODS     Performed at [facility]   Culture     Final   Value: MULTIPLE ORGANISMS PRESENT, NONE PREDOMINANT     Note: NO STAPHYLOCOCCUS AUREUS ISOLATED NO GROUP A STREP (S.PYOGENES) ISOLATED      Performed at [facility]   Report Status 01/05/2014 FINAL   Final  ANAEROBIC CULTURE     Status: None   Collection Time    01/01/14  5:13 PM      Result Value Ref Range Status   Specimen Description ABSCESS MIDLINE INCISION   Final   Special Requests NONE   Final   Gram Stain     Final   Value: FEW WBC PRESENT,BOTH PMN AND MONONUCLEAR     NO SQUAMOUS EPITHELIAL CELLS SEEN     FEW GRAM POSITIVE COCCI     IN PAIRS IN CLUSTERS FEW GRAM NEGATIVE RODS     Performed at [facility]   Culture     Final   Value: NO ANAEROBES ISOLATED; CULTURE IN PROGRESS FOR 5 DAYS     Performed at [facility]   Report Status PENDING   Incomplete   Assessment: Her abdominal wound abscess has responded well to incision and drainage and a short course of antibiotics. I agree with observation off of antibiotics for now. She has a drug-induced hypersensitivity reaction with severe rash. The most likely culprits are recent sulfa antibiotic, Trileptal or Lamictal. I agree with stopping Trileptal today in completing a short course of prednisone. Once her rash has resolved I would consider re\re challenging, one at a time with Trileptal and Lamictal.  Plan: 1. Observe off of antibiotics 2. Stop Trileptal 3. Complete short course of prednisone  Michel Bickers, MD [facility] [place] [phone] pager   [phone] cell 01/05/2014, 4:19 PM
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
## 2014-01-05 NOTE — Progress Notes (Addendum)
PROGRESS NOTE    ROSAMAE ROCQUE CZY:[id number] DOB: [birth year] DOA: 01/01/2014 PCP: Guadlupe Spanish, MD  HPI/Brief narrative 48 year old female with history of HTN, hypercholesterolemia, depression, morbid obesity, OSA, GERD, s/p emergent laparotomy for necrotic bowel resection 12/24/13 at [facility], admitted by surgery on 01/01/14 for postoperative wound abscess, underwent I&D. [facility] was consulted on 9/23 for progressively worsening diffuse skin rash.   Assessment/Plan:  1. Skin rash: Highly suspicious for drug eruption. She started sulfa on 9/19, noticed beginning of skin rash on her upper extremities the next day and stopped taking antibiotics on 9/20. Over the next 3 days (including this hospitalization) rash however progressed to involve almost all of her body. Since admission, patient had been on IV vancomycin, Cipro and Flagyl-DC'd on 9/23. There were new psychiatric medications started recently (Lamictal, Trileptal, Klonopin and trazodone). DD for her skin rash includes-sulfa drugs still a possibility despite having stopped it PTA, psych medications-Trileptal and Lamictal, narcotics-only morphine is new. She denies any new environmental exposures. Discontinued antibiotics. Trial of PO prednisone 50 mg daily x3 days-rash and itching seem to be slightly better compared to yesterday. Consulted psychiatry 9/23 to evaluate medication management given above scenario-input pending. Monitor closely. 2. Anemia: Stable 3. Anxiety and depression: Psychiatry consulted. Advised patient that prednisone can also cause mood swings but she states that she felt like this even before starting prednisone yesterday. 4. Hypertension: Controlled off meds. 5. Thrombocytosis: Possibly reactive.    Code Status: Full Family Communication: None at bedside. Disposition Plan: Home in medically stable   Consultants:  Infectious disease  Psychiatry-pending  Procedures:  Incision and drainage of  postoperative abdominal wall abscess 9/20  Antibiotics:  All discontinued 9/23   Subjective: States that itching and rash may be slightly better than yesterday. Emotional and tearful this morning-denies specific reasons.  Objective: Filed Vitals:   01/04/14 0517 01/04/14 1400 01/04/14 2115 01/05/14 0534  BP: 101/63 117/60 118/70 111/56  Pulse: 83 77 73 64  Temp: 98.1 F (36.7 C) 99 F (37.2 C) 97.8 F (36.6 C) 98.1 F (36.7 C)  TempSrc: Oral Oral Oral Oral  Resp: 16 18 16 16   Height:      Weight:      SpO2: 94% 96% 94% 97%    Intake/Output Summary (Last 24 hours) at 01/05/14 1349 Last data filed at 01/05/14 1000  Gross per 24 hour  Intake   1200 ml  Output   1650 ml  Net   -450 ml   Filed Weights   01/01/14 1553  Weight: 88.451 kg (195 lb)     Exam:  General exam: Pleasant young female sitting up on chair and tearful this morning. Respiratory system: Clear. No increased work of breathing. Cardiovascular system: S1 & S2 heard, RRR. No JVD, murmurs, gallops, clicks or pedal edema. Gastrointestinal system: Abdomen is nondistended, soft and nontender. Normal bowel sounds heard. Central nervous system: Alert and oriented. No focal neurological deficits. Extremities: Symmetric 5 x 5 power. Skin: Diffuse maculopapular rash involving face, neck, front and back of trunk and all extremities, pruritic and coalesced in many areas of trunk. Rash on her arms and face seemed to be less red with some areas of clearing over forehead    Data Reviewed: Basic Metabolic Panel:  Recent Labs Lab 01/01/14 1402 01/02/14 0456 01/03/14 0515 01/04/14 0455 01/05/14 0545  NA 136* 138 140 138 139  K 3.8 4.3 3.7 3.8 4.1  CL 94* 101 105 104 104  CO2 29 27 25  24  23  GLUCOSE 111* 199* 114* 104* 139*  BUN 11 9 12 9 10   CREATININE 0.60 0.43* 0.52 0.62 0.48*  CALCIUM 8.9 8.5 8.2* 8.0* 8.5   Liver Function Tests:  Recent Labs Lab 01/01/14 1402  AST 21  ALT 29  ALKPHOS 123*    BILITOT 0.3  PROT 7.2  ALBUMIN 2.7*   No results found for this basename: LIPASE, AMYLASE,  in the last 168 hours No results found for this basename: AMMONIA,  in the last 168 hours CBC:  Recent Labs Lab 01/01/14 1402 01/02/14 0456 01/03/14 0515 01/04/14 0455 01/05/14 0545  WBC 12.4* 11.9* 11.4* 9.6 9.8  NEUTROABS 11.3*  --   --   --   --   HGB 11.3* 10.5* 10.1* 11.2* 11.0*  HCT 32.5* 30.5* 30.1* 33.3* 32.3*  MCV 93.1 95.0 95.9 96.2 94.2  PLT 410* 403* 505* 600* 634*   Cardiac Enzymes: No results found for this basename: CKTOTAL, CKMB, CKMBINDEX, TROPONINI,  in the last 168 hours BNP (last 3 results) No results found for this basename: PROBNP,  in the last 8760 hours CBG: No results found for this basename: GLUCAP,  in the last 168 hours  Recent Results (from the past 240 hour(s))  CULTURE, ROUTINE-ABSCESS     Status: None   Collection Time    01/01/14  5:13 PM      Result Value Ref Range Status   Specimen Description ABSCESS MIDLINE INCISION   Final   Special Requests NONE   Final   Gram Stain     Final   Value: FEW WBC PRESENT,BOTH PMN AND MONONUCLEAR     NO SQUAMOUS EPITHELIAL CELLS SEEN     FEW GRAM POSITIVE COCCI     IN PAIRS IN CLUSTERS FEW GRAM NEGATIVE RODS     Performed at [facility]   Culture     Final   Value: MULTIPLE ORGANISMS PRESENT, NONE PREDOMINANT     Note: NO STAPHYLOCOCCUS AUREUS ISOLATED NO GROUP A STREP (S.PYOGENES) ISOLATED     Performed at [facility]   Report Status 01/05/2014 FINAL   Final  ANAEROBIC CULTURE     Status: None   Collection Time    01/01/14  5:13 PM      Result Value Ref Range Status   Specimen Description ABSCESS MIDLINE INCISION   Final   Special Requests NONE   Final   Gram Stain     Final   Value: FEW WBC PRESENT,BOTH PMN AND MONONUCLEAR     NO SQUAMOUS EPITHELIAL CELLS SEEN     FEW GRAM POSITIVE COCCI     IN PAIRS IN CLUSTERS FEW GRAM NEGATIVE RODS     Performed at [facility]    Culture     Final   Value: NO ANAEROBES ISOLATED; CULTURE IN PROGRESS FOR 5 DAYS     Performed at [facility]   Report Status PENDING   Incomplete         Studies: No results found.      Scheduled Meds: . clonazePAM  1 mg Oral QHS  . DULoxetine  30 mg Oral Daily  . enoxaparin (LOVENOX) injection  40 mg Subcutaneous Q24H  . loratadine  10 mg Oral Daily  . multivitamin with minerals  1 tablet Oral Daily  . OXcarbazepine  150 mg Oral BID  . predniSONE  50 mg Oral Daily  . pseudoephedrine  120 mg Oral BID  . saccharomyces boulardii  250  mg Oral BID  . traZODone  50 mg Oral QHS   Continuous Infusions: . 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   Severe recurrent major depression without psychotic features   PTSD (post-traumatic stress disorder)   Abdominal abscess   Wound infection after surgery   Normocytic anemia   DJD (degenerative joint disease)   Hypertension   Dyslipidemia   GERD (gastroesophageal reflux disease)   Obstructive sleep apnea   Status post small bowel resection    Time spent: 69 minutes    Arrie Borrelli, MD, FACP, FHM. [facility] Pager [phone]  If 7PM-7AM, please contact night-coverage [email or web address] Password [email or web address] 01/05/2014, 1:49 PM    LOS: 4 days

## 2014-01-06 ENCOUNTER — Other Ambulatory Visit (HOSPITAL_COMMUNITY): Payer: 59

## 2014-01-06 LAB — CBC
HEMATOCRIT: 31.5 % — AB (ref 36.0–46.0)
HEMOGLOBIN: 10.6 g/dL — AB (ref 12.0–15.0)
MCH: 31.8 pg (ref 26.0–34.0)
MCHC: 33.7 g/dL (ref 30.0–36.0)
MCV: 94.6 fL (ref 78.0–100.0)
Platelets: 652 10*3/uL — ABNORMAL HIGH (ref 150–400)
RBC: 3.33 MIL/uL — ABNORMAL LOW (ref 3.87–5.11)
RDW: 13.1 % (ref 11.5–15.5)
WBC: 10 10*3/uL (ref 4.0–10.5)

## 2014-01-06 LAB — ANAEROBIC CULTURE

## 2014-01-06 LAB — BASIC METABOLIC PANEL
Anion gap: 11 (ref 5–15)
BUN: 10 mg/dL (ref 6–23)
CHLORIDE: 106 meq/L (ref 96–112)
CO2: 24 mEq/L (ref 19–32)
Calcium: 8.4 mg/dL (ref 8.4–10.5)
Creatinine, Ser: 0.51 mg/dL (ref 0.50–1.10)
GFR calc Af Amer: >90 mL/min (ref >90)
GFR calc non Af Amer: >90 mL/min (ref >90)
GLUCOSE: 116 mg/dL — AB (ref 70–99)
Potassium: 3.8 mEq/L (ref 3.7–5.3)
Sodium: 141 mEq/L (ref 137–147)

## 2014-01-06 MED ORDER — PREDNISONE 50 MG PO TABS
50.0000 mg | ORAL_TABLET | Freq: Every day | ORAL | Status: AC
  Administered 2014-01-07 – 2014-01-08 (×2): 50 mg via ORAL
  Filled 2014-01-06 (×2): qty 1

## 2014-01-06 NOTE — Consult Note (Signed)
[facility] Face-to-Face Psychiatry Consult follow up   Reason for Consult:  Severe rash and medication management Referring Physician:  Dr. [name] Merl [name] is an 48 y.o. female. Total Time spent with patient: 30 minutes  Assessment: AXIS I:  Major Depression, Recurrent severe and Post Traumatic Stress Disorder AXIS II:  Deferred AXIS III:   Past Medical History  Diagnosis Date  . Migraine   . Hypertension   . Hypercholesterolemia   . Depression   . Morbid obesity   . Sleep apnea     no CPAP machine   . H/O hiatal hernia   . GERD (gastroesophageal reflux disease)     hx of   . Arthritis     hips, knees and hands   . Anxiety   . PTSD (post-traumatic stress disorder)    AXIS IV:  other psychosocial or environmental problems, problems related to social environment and problems with primary support group AXIS V:  51-60 moderate symptoms  Plan:  Case discussed with Dr. Algis Liming and Sindy Messing, LCSW No evidence of imminent risk to self or others at present.   Patient does not meet criteria for psychiatric inpatient admission. Supportive therapy provided about ongoing stressors. Continue Seroquel 25 mg PO TID for mood stabilization. Continue Cymbalta 30 mg PO QAm for depression Appreciate psychiatric consultation and follow up as clinically required.  Please contact [phone] if needs further assistance  Subjective:   Brittany [name] is a 48 y.o. female patient admitted with medication induced severe skin rash.  Patient is seen for psychiatric consultion follow up evaluation for depression, PTSD and medication induced skin rash. She has been tolerating medication changes and positively responded with less anxiety, better sleep and less itching and rash is less prominent today. She has been compliant with Seroquel and cymbalta with out side effects. She has denied active suicidal or homicidal ideation and no evidence of psychosis.   Past Psychiatric History: Past Medical History   Diagnosis Date  . Migraine   . Hypertension   . Hypercholesterolemia   . Depression   . Morbid obesity   . Sleep apnea     no CPAP machine   . H/O hiatal hernia   . GERD (gastroesophageal reflux disease)     hx of   . Arthritis     hips, knees and hands   . Anxiety   . PTSD (post-traumatic stress disorder)     reports that she quit smoking about 9 years ago. Her smoking use included Cigars. She has never used smokeless tobacco. She reports that she drinks alcohol. She reports that she does not use illicit drugs. Family History  Problem Relation Age of Onset  . Cancer Mother     colon  . Cancer Maternal Aunt     breast  . Depression Father   . Depression Sister   . Alcohol abuse Paternal Uncle   . Bipolar disorder Cousin      Living Arrangements: Spouse/significant other   Abuse/Neglect [employer]) Physical Abuse: Denies Verbal Abuse: Denies Sexual Abuse: Denies Allergies:   Allergies  Allergen Reactions  . Amoxicillin Hives    ACT Assessment Complete:  NO Objective: Blood pressure 107/59, pulse 59, temperature 98.1 F (36.7 C), temperature source Oral, resp. rate 16, height _0  (1.778 m), weight 88.451 kg (195 lb), SpO2 96.00%.Body mass index is 27.98 kg/(m^2). Results for orders placed during the hospital encounter of 01/01/14 (from the past 72 hour(s))  CBC     Status: Abnormal  Collection Time    01/04/14  4:55 AM      Result Value Ref Range   WBC 9.6  4.0 - 10.5 K/uL   RBC 3.46 (*) 3.87 - 5.11 MIL/uL   Hemoglobin 11.2 (*) 12.0 - 15.0 g/dL   HCT 33.3 (*) 36.0 - 46.0 %   MCV 96.2  78.0 - 100.0 fL   MCH 32.4  26.0 - 34.0 pg   MCHC 33.6  30.0 - 36.0 g/dL   RDW 13.0  11.5 - 15.5 %   Platelets 600 (*) 150 - 400 K/uL  BASIC METABOLIC PANEL     Status: Abnormal   Collection Time    01/04/14  4:55 AM      Result Value Ref Range   Sodium 138  137 - 147 mEq/L   Potassium 3.8  3.7 - 5.3 mEq/L   Chloride 104  96 - 112 mEq/L   CO2 24  19 - 32 mEq/L   Glucose,  Bld 104 (*) 70 - 99 mg/dL   BUN 9  6 - 23 mg/dL   Creatinine, Ser 0.62  0.50 - 1.10 mg/dL   Calcium 8.0 (*) 8.4 - 10.5 mg/dL   GFR calc non Af Amer >90  >90 mL/min   GFR calc Af Amer >90  >90 mL/min   Comment: (NOTE)     The eGFR has been calculated using the CKD EPI equation.     This calculation has not been validated in all clinical situations.     eGFR's persistently <90 mL/min signify possible Chronic Kidney     Disease.   Anion gap 10  5 - 15  CBC     Status: Abnormal   Collection Time    01/05/14  5:45 AM      Result Value Ref Range   WBC 9.8  4.0 - 10.5 K/uL   RBC 3.43 (*) 3.87 - 5.11 MIL/uL   Hemoglobin 11.0 (*) 12.0 - 15.0 g/dL   HCT 32.3 (*) 36.0 - 46.0 %   MCV 94.2  78.0 - 100.0 fL   MCH 32.1  26.0 - 34.0 pg   MCHC 34.1  30.0 - 36.0 g/dL   RDW 13.1  11.5 - 15.5 %   Platelets 634 (*) 150 - 400 K/uL  BASIC METABOLIC PANEL     Status: Abnormal   Collection Time    01/05/14  5:45 AM      Result Value Ref Range   Sodium 139  137 - 147 mEq/L   Potassium 4.1  3.7 - 5.3 mEq/L   Chloride 104  96 - 112 mEq/L   CO2 23  19 - 32 mEq/L   Glucose, Bld 139 (*) 70 - 99 mg/dL   BUN 10  6 - 23 mg/dL   Creatinine, Ser 0.48 (*) 0.50 - 1.10 mg/dL   Calcium 8.5  8.4 - 10.5 mg/dL   GFR calc non Af Amer >90  >90 mL/min   GFR calc Af Amer >90  >90 mL/min   Comment: (NOTE)     The eGFR has been calculated using the CKD EPI equation.     This calculation has not been validated in all clinical situations.     eGFR's persistently <90 mL/min signify possible Chronic Kidney     Disease.   Anion gap 12  5 - 15  CBC     Status: Abnormal   Collection Time    01/06/14  4:55 AM      Result  Value Ref Range   WBC 10.0  4.0 - 10.5 K/uL   RBC 3.33 (*) 3.87 - 5.11 MIL/uL   Hemoglobin 10.6 (*) 12.0 - 15.0 g/dL   HCT 31.5 (*) 36.0 - 46.0 %   MCV 94.6  78.0 - 100.0 fL   MCH 31.8  26.0 - 34.0 pg   MCHC 33.7  30.0 - 36.0 g/dL   RDW 13.1  11.5 - 15.5 %   Platelets 652 (*) 150 - 400 K/uL  BASIC  METABOLIC PANEL     Status: Abnormal   Collection Time    01/06/14  4:55 AM      Result Value Ref Range   Sodium 141  137 - 147 mEq/L   Potassium 3.8  3.7 - 5.3 mEq/L   Chloride 106  96 - 112 mEq/L   CO2 24  19 - 32 mEq/L   Glucose, Bld 116 (*) 70 - 99 mg/dL   BUN 10  6 - 23 mg/dL   Creatinine, Ser 0.51  0.50 - 1.10 mg/dL   Calcium 8.4  8.4 - 10.5 mg/dL   GFR calc non Af Amer >90  >90 mL/min   GFR calc Af Amer >90  >90 mL/min   Comment: (NOTE)     The eGFR has been calculated using the CKD EPI equation.     This calculation has not been validated in all clinical situations.     eGFR's persistently <90 mL/min signify possible Chronic Kidney     Disease.   Anion gap 11  5 - 15   Labs are reviewed.  Current Facility-Administered Medications  Medication Dose Route Frequency Provider Last Rate Last Dose  . clonazePAM (KLONOPIN) tablet 1 mg  1 mg Oral QHS [name], MD   1 mg at 01/05/14 2151  . dextrose 5 % and 0.9 % NaCl with KCl 20 mEq/L infusion   Intravenous Continuous Earnstine Regal, PA-C 50 mL/hr at 01/05/14 2153    . diphenhydrAMINE (BENADRYL) capsule 25 mg  25 mg Oral Q4H PRN [name], MD   25 mg at 01/06/14 0917  . DULoxetine (CYMBALTA) DR capsule 30 mg  30 mg Oral Daily [name], MD   30 mg at 01/06/14 [phone]  . enoxaparin (LOVENOX) injection 40 mg  40 mg Subcutaneous Q24H [name], MD   40 mg at 01/05/14 2153  . loratadine (CLARITIN) tablet 10 mg  10 mg Oral Daily [name], MD   10 mg at 01/03/14 1024  . morphine 2 MG/ML injection 2-4 mg  2-4 mg Intravenous Q2H PRN [name], MD   4 mg at 01/06/14 1433  . multivitamin with minerals tablet 1 tablet  1 tablet Oral Daily [name], MD   1 tablet at 01/06/14 [phone]  . ondansetron (ZOFRAN) tablet 4 mg  4 mg Oral Q6H PRN [name], MD       Or  . ondansetron [facility]) injection 4 mg  4 mg Intravenous Q6H PRN [name], MD      . oxyCODONE-acetaminophen  (PERCOCET/ROXICET) 5-325 MG per tablet 1-2 tablet  1-2 tablet Oral Q4H PRN [name], MD   1 tablet at 01/06/14 [phone]  . [START ON 01/07/2014] predniSONE (DELTASONE) tablet 50 mg  50 mg Oral Q breakfast Modena Jansky, MD      . pseudoephedrine (SUDAFED) 12 hr tablet 120 mg  120 mg Oral BID [name], MD   120 mg at 01/03/14 1025  . QUEtiapine (SEROQUEL) tablet 25 mg  25 mg Oral TID Durward Parcel, MD   25 mg at 01/06/14 0917  . saccharomyces boulardii (FLORASTOR) capsule 250 mg  250 mg Oral BID Earnstine Regal, PA-C   250 mg at 01/06/14 2060  . senna-docusate (Senokot-S) tablet 2 tablet  2 tablet Oral QHS PRN Earnstine Regal, PA-C      . traZODone (DESYREL) tablet 50 mg  50 mg Oral QHS [name], MD   50 mg at 01/05/14 2151    Psychiatric Specialty Exam: Physical Exam  Skin: Rash noted.    Review of Systems  Constitutional: Positive for malaise/fatigue.  Musculoskeletal: Positive for myalgias.  Neurological: Positive for weakness.  Psychiatric/Behavioral: Positive for depression and suicidal ideas. The patient is nervous/anxious and has insomnia.     Blood pressure 107/59, pulse 59, temperature 98.1 F (36.7 C), temperature source Oral, resp. rate 16, height _0  (1.778 m), weight 88.451 kg (195 lb), SpO2 96.00%.Body mass index is 27.98 kg/(m^2).  General Appearance: Casual  Eye Contact::  Good  Speech:  Clear and Coherent and Slow  Volume:  Decreased  Mood:  Anxious, Depressed and Dysphoric  Affect:  Appropriate and Depressed  Thought Process:  Coherent and Goal Directed  Orientation:  Full (Time, Place, and Person)  Thought Content:  Rumination  Suicidal Thoughts:  Yes.  without intent/plan  Homicidal Thoughts:  No  Memory:  Immediate;   Good Recent;   Good  Judgement:  Good  Insight:  Good  Psychomotor Activity:  Decreased  Concentration:  Good  Recall:  Good  Fund of Knowledge:Good  Language: Good  Akathisia:  NA  Handed:  Right   AIMS (if indicated):     Assets:  Communication Skills Desire for Improvement Financial Resources/Insurance Housing Intimacy Leisure Time Resilience Social Support Talents/Skills Transportation  Sleep:      Musculoskeletal: Strength & Muscle Tone: decreased Gait & Station: unable to stand Patient leans: N/A  Treatment Plan Summary: Daily contact with patient to assess and evaluate symptoms and progress in treatment Medication management Continue Cymbalta 30 mg PO Qam for depression  Continue Seroquel 25 mg PO TID for mood stabilization   Coltan Spinello,JANARDHAHA R. 01/06/2014 3:44 PM

## 2014-01-06 NOTE — Progress Notes (Signed)
PROGRESS NOTE    Br[place]any [name] ALP:[id number] DOB: 1965/08/28 DOA: 01/01/2014 PCP: Guadlupe Spanish, MD  HPI/Brief narrative 48 year old female with history of HTN, hypercholesterolemia, depression, morbid obesity, OSA, GERD, s/p emergent laparotomy for necrotic bowel resection 12/24/13 at [facility], admitted by surgery on 01/01/14 for postoperative wound abscess, underwent I&D. TRH was consulted on 9/23 for progressively worsening diffuse skin rash-attributed to drug rash. Supposed offending agents-sulfa, Lamictal and Trileptal held. Improving with prednisone.   Assessment/Plan:  1. Skin rash: Highly suspicious for drug eruption. She started sulfa on 9/19, noticed beginning of skin rash on her upper extremities the next day and stopped taking antibiotics on 9/20. Over the next 3 days (including this hospitalization) rash however progressed to involve almost all of her body. Since admission, patient had been on IV vancomycin, Cipro and Flagyl-DC'd on 9/23. There were new psychiatric medications started recently (Lamictal, Trileptal, Klonopin and trazodone). DD for her skin rash includes-sulfa drugs still a possibility despite having stopped it PTA, psych medications-Trileptal and Lamictal, narcotics-only morphine is new. She denies any new environmental exposures. Discontinued antibiotics. Trial of PO prednisone 50 mg daily x5 days-rash gradually improving. Discussed with psychiatry on 9/24-discontinued Trileptal. 2. Anemia: Stable 3. Anxiety and depression: Management per psychiatry. Advised patient that prednisone can also cause mood swings but she states that she felt like this even before starting prednisone yesterday. 4. Hypertension: Controlled off meds. 5. Thrombocytosis: Possibly reactive.    Code Status: Full Family Communication: None at bedside. Disposition Plan: Home when medically stable & as per primary service.   Consultants:  Infectious  disease  Psychiatry  Procedures:  Incision and drainage of postoperative abdominal wall abscess 9/20  Antibiotics:  All discontinued 9/23   Subjective: Diffuse body rash and itching continue to improve. Emotionally feels much better than yesterday.  Objective: Filed Vitals:   01/05/14 0534 01/05/14 1400 01/05/14 2215 01/06/14 0548  BP: 111/56 123/64 123/70 107/59  Pulse: 64 70 65 59  Temp: 98.1 F (36.7 C) 97.5 F (36.4 C) 98 F (36.7 C) 98.1 F (36.7 C)  TempSrc: Oral Oral Oral Oral  Resp: 16 16 16 16   Height:      Weight:      SpO2: 97% 93% 96% 96%    Intake/Output Summary (Last 24 hours) at 01/06/14 1408 Last data filed at 01/06/14 1016  Gross per 24 hour  Intake   1020 ml  Output   1200 ml  Net   -180 ml   Filed Weights   01/01/14 1553  Weight: 88.451 kg (195 lb)     Exam: Patient examined every single day with female RN chaperone in room. General exam: Pleasant young female lying comfortably in bed and appears pleasant and cheerful this morning. Respiratory system: Clear. No increased work of breathing. Cardiovascular system: S1 & S2 heard, RRR. No JVD, murmurs, gallops, clicks or pedal edema. Gastrointestinal system: Abdomen is nondistended, soft and nontender. Normal bowel sounds heard. Central nervous system: Alert and oriented. No focal neurological deficits. Extremities: Symmetric 5 x 5 power. Skin: Diffuse maculopapular rash involving face, neck, front and back of trunk and all extremities, pruritic and coalesced in many areas of trunk. All gradually improving especially over face and upper arms.   Data Reviewed: Basic Metabolic Panel:  Recent Labs Lab 01/02/14 0456 01/03/14 0515 01/04/14 0455 01/05/14 0545 01/06/14 0455  NA 138 140 138 139 141  K 4.3 3.7 3.8 4.1 3.8  CL 101 105 104 104 106  CO2 27  25 24 23 24   GLUCOSE 199* 114* 104* 139* 116*  BUN 9 12 9 10 10   CREATININE 0.43* 0.52 0.62 0.48* 0.51  CALCIUM 8.5 8.2* 8.0* 8.5 8.4    Liver Function Tests:  Recent Labs Lab 01/01/14 1402  AST 21  ALT 29  ALKPHOS 123*  BILITOT 0.3  PROT 7.2  ALBUMIN 2.7*   No results found for this basename: LIPASE, AMYLASE,  in the last 168 hours No results found for this basename: AMMONIA,  in the last 168 hours CBC:  Recent Labs Lab 01/01/14 1402 01/02/14 0456 01/03/14 0515 01/04/14 0455 01/05/14 0545 01/06/14 0455  WBC 12.4* 11.9* 11.4* 9.6 9.8 10.0  NEUTROABS 11.3*  --   --   --   --   --   HGB 11.3* 10.5* 10.1* 11.2* 11.0* 10.6*  HCT 32.5* 30.5* 30.1* 33.3* 32.3* 31.5*  MCV 93.1 95.0 95.9 96.2 94.2 94.6  PLT 410* 403* 505* 600* 634* 652*   Cardiac Enzymes: No results found for this basename: CKTOTAL, CKMB, CKMBINDEX, TROPONINI,  in the last 168 hours BNP (last 3 results) No results found for this basename: PROBNP,  in the last 8760 hours CBG: No results found for this basename: GLUCAP,  in the last 168 hours  Recent Results (from the past 240 hour(s))  CULTURE, ROUTINE-ABSCESS     Status: None   Collection Time    01/01/14  5:13 PM      Result Value Ref Range Status   Specimen Description ABSCESS MIDLINE INCISION   Final   Special Requests NONE   Final   Gram Stain     Final   Value: FEW WBC PRESENT,BOTH PMN AND MONONUCLEAR     NO SQUAMOUS EPITHELIAL CELLS SEEN     FEW GRAM POSITIVE COCCI     IN PAIRS IN CLUSTERS FEW GRAM NEGATIVE RODS     Performed at [facility]   Culture     Final   Value: MULTIPLE ORGANISMS PRESENT, NONE PREDOMINANT     Note: NO STAPHYLOCOCCUS AUREUS ISOLATED NO GROUP A STREP (S.PYOGENES) ISOLATED     Performed at [facility]   Report Status 01/05/2014 FINAL   Final  ANAEROBIC CULTURE     Status: None   Collection Time    01/01/14  5:13 PM      Result Value Ref Range Status   Specimen Description ABSCESS MIDLINE INCISION   Final   Special Requests NONE   Final   Gram Stain     Final   Value: FEW WBC PRESENT,BOTH PMN AND MONONUCLEAR     NO SQUAMOUS  EPITHELIAL CELLS SEEN     FEW GRAM POSITIVE COCCI     IN PAIRS IN CLUSTERS FEW GRAM NEGATIVE RODS     Performed at [facility]   Culture     Final   Value: NO ANAEROBES ISOLATED     Performed at [facility]   Report Status 01/06/2014 FINAL   Final         Studies: No results found.      Scheduled Meds: . clonazePAM  1 mg Oral QHS  . DULoxetine  30 mg Oral Daily  . enoxaparin (LOVENOX) injection  40 mg Subcutaneous Q24H  . loratadine  10 mg Oral Daily  . multivitamin with minerals  1 tablet Oral Daily  . pseudoephedrine  120 mg Oral BID  . QUEtiapine  25 mg Oral TID  . saccharomyces boulardii  250 mg  Oral BID  . traZODone  50 mg Oral QHS   Continuous Infusions: . 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   Severe recurrent major depression without psychotic features   PTSD (post-traumatic stress disorder)   Abdominal abscess   Wound infection after surgery   Normocytic anemia   DJD (degenerative joint disease)   Hypertension   Dyslipidemia   GERD (gastroesophageal reflux disease)   Obstructive sleep apnea   Status post small bowel resection    Time spent: 84 minutes    HONGALGI,ANAND, MD, FACP, FHM. [facility] Pager [phone]  If 7PM-7AM, please contact night-coverage [email or web address] Password [email or web address] 01/06/2014, 2:08 PM    LOS: 5 days

## 2014-01-06 NOTE — Progress Notes (Signed)
Clinical Social Work  CSW went to room to follow up with patient but RN reports patient getting dressing changes. CSW will follow up at later time.  [place], [place] [phone]

## 2014-01-06 NOTE — Consult Note (Signed)
WOC wound consult note Reason for Consult: Recent debridement midline abdominal wound, NPWT (VAC) ordered at this time.  Wound type: Surgical wound, s/p debridement 01/01/14 Measurement: 17 cm x 14 cm x 6 cm with visible suturing in wound bed at 12 o'clock.  Wound bed: pale pink and nongranulating.  Drainage (amount, consistency, odor) Moderate, serosanguinous drainage.  No odor.  Abdominal binder in place.  Periwound: diffuse skin rash to trunk and arms/face from apparent drug reaction.  Dressing procedure/placement/frequency: Cleanse abdominal wound with NS and pat gently dry.  Apply Versafoam (white foamKellie Simmering [phone])  to wound bed, covering blue sutures present at 12 o'clock.  Mepitel silicone layer (LAwson # [id number]) along sides of wound bed.  Fill wound bed with Granufoam dressing (black foamKellie Simmering # [phone]). Cover wound with VAC drape and apply Trac pad at 6 o'clock .  Abdominal binder in place per MD order. Change Mon/Wed/Fri WOC team will continue to follow.  Domenic Moras RN BSN [place] Pager [phone]

## 2014-01-06 NOTE — Progress Notes (Signed)
Patient ID: Brittany [name], female   DOB: 19-Jan-1966, 48 y.o.   MRN: [id number]     [place]      [name] [place], [place] [address]    Phone: [phone] FAX: [phone]     Subjective: Reports that the rash is better today.  Less pruritis.  Afebrile.  VSS.  Seems in good spirits.   Objective:  Vital signs:  Filed Vitals:   01/05/14 0534 01/05/14 1400 01/05/14 2215 01/06/14 0548  BP: 111/56 123/64 123/70 107/59  Pulse: 64 70 65 59  Temp: 98.1 F (36.7 C) 97.5 F (36.4 C) 98 F (36.7 C) 98.1 F (36.7 C)  TempSrc: Oral Oral Oral Oral  Resp: '16 16 16 16  ' Height:      Weight:      SpO2: 97% 93% 96% 96%    Last BM Date: 01/06/14  Intake/Output   Yesterday:  09/24 0701 - 09/25 0700 In: 6440 [P.O.:235; I.V.:1200] Out: 1150 [Urine:1150] This shift:  Total I/O In: -  Out: 400 [Urine:400]   Physical Exam: General: Pt awake/alert/oriented x4 in no acute distress Abdomen: Soft.  Nondistended.  No evidence of peritonitis.    Skin: generalized erythematous maculopapular rash.    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   Severe recurrent major depression without psychotic features   PTSD (post-traumatic stress disorder)   Abdominal abscess   Wound infection after surgery   Normocytic anemia   DJD (degenerative joint disease)   Hypertension   Dyslipidemia   GERD (gastroesophageal reflux disease)   Obstructive sleep apnea   Status post small bowel resection    Results:   Labs: Results for orders placed during the hospital encounter of 01/01/14 (from the past 48 hour(s))  CBC     Status: Abnormal   Collection Time    01/05/14  5:45 AM      Result Value Ref Range   WBC 9.8  4.0 - 10.5 K/uL   RBC 3.43 (*) 3.87 - 5.11 MIL/uL   Hemoglobin 11.0 (*) 12.0 - 15.0 g/dL   HCT 32.3 (*) 36.0 -  46.0 %   MCV 94.2  78.0 - 100.0 fL   MCH 32.1  26.0 - 34.0 pg   MCHC 34.1  30.0 - 36.0 g/dL   RDW 13.1  11.5 - 15.5 %   Platelets 634 (*) 150 - 400 K/uL  BASIC METABOLIC PANEL     Status: Abnormal   Collection Time    01/05/14  5:45 AM      Result Value Ref Range   Sodium 139  137 - 147 mEq/L   Potassium 4.1  3.7 - 5.3 mEq/L   Chloride 104  96 - 112 mEq/L   CO2 23  19 - 32 mEq/L   Glucose, Bld 139 (*) 70 - 99 mg/dL   BUN 10  6 - 23 mg/dL   Creatinine, Ser 0.48 (*) 0.50 - 1.10 mg/dL   Calcium 8.5  8.4 - 10.5 mg/dL   GFR calc non Af Amer >90  >90 mL/min   GFR calc Af Amer >90  >90 mL/min   Comment: (NOTE)     The eGFR has been calculated using the CKD EPI equation.     This calculation has not been validated in all clinical situations.     eGFR's  persistently <90 mL/min signify possible Chronic Kidney     Disease.   Anion gap 12  5 - 15  CBC     Status: Abnormal   Collection Time    01/06/14  4:55 AM      Result Value Ref Range   WBC 10.0  4.0 - 10.5 K/uL   RBC 3.33 (*) 3.87 - 5.11 MIL/uL   Hemoglobin 10.6 (*) 12.0 - 15.0 g/dL   HCT 31.5 (*) 36.0 - 46.0 %   MCV 94.6  78.0 - 100.0 fL   MCH 31.8  26.0 - 34.0 pg   MCHC 33.7  30.0 - 36.0 g/dL   RDW 13.1  11.5 - 15.5 %   Platelets 652 (*) 150 - 400 K/uL  BASIC METABOLIC PANEL     Status: Abnormal   Collection Time    01/06/14  4:55 AM      Result Value Ref Range   Sodium 141  137 - 147 mEq/L   Potassium 3.8  3.7 - 5.3 mEq/L   Chloride 106  96 - 112 mEq/L   CO2 24  19 - 32 mEq/L   Glucose, Bld 116 (*) 70 - 99 mg/dL   BUN 10  6 - 23 mg/dL   Creatinine, Ser 0.51  0.50 - 1.10 mg/dL   Calcium 8.4  8.4 - 10.5 mg/dL   GFR calc non Af Amer >90  >90 mL/min   GFR calc Af Amer >90  >90 mL/min   Comment: (NOTE)     The eGFR has been calculated using the CKD EPI equation.     This calculation has not been validated in all clinical situations.     eGFR's persistently <90 mL/min signify possible Chronic Kidney     Disease.   Anion  gap 11  5 - 15    Imaging / Studies: No results found.  Medications / Allergies:  Scheduled Meds: . clonazePAM  1 mg Oral QHS  . DULoxetine  30 mg Oral Daily  . enoxaparin (LOVENOX) injection  40 mg Subcutaneous Q24H  . loratadine  10 mg Oral Daily  . multivitamin with minerals  1 tablet Oral Daily  . pseudoephedrine  120 mg Oral BID  . QUEtiapine  25 mg Oral TID  . saccharomyces boulardii  250 mg Oral BID  . traZODone  50 mg Oral QHS   Continuous Infusions: . dextrose 5 % and 0.9 % NaCl with KCl 20 mEq/L 50 mL/hr at 01/05/14 2153   PRN Meds:.diphenhydrAMINE, morphine injection, ondansetron (ZOFRAN) IV, ondansetron, oxyCODONE-acetaminophen, senna-docusate  Antibiotics: Anti-infectives   Start     Dose/Rate Route Frequency Ordered Stop   01/02/14 0800  ciprofloxacin (CIPRO) IVPB 400 mg  Status:  Discontinued     400 mg 200 mL/hr over 60 Minutes Intravenous Every 12 hours 01/01/14 1846 01/04/14 1144   01/01/14 2200  metroNIDAZOLE (FLAGYL) IVPB 500 mg  Status:  Discontinued     500 mg 100 mL/hr over 60 Minutes Intravenous 3 times per day 01/01/14 1846 01/04/14 1144   01/01/14 1600  vancomycin (VANCOCIN) IVPB 1000 mg/200 mL premix  Status:  Discontinued     1,000 mg 200 mL/hr over 60 Minutes Intravenous Every 8 hours 01/01/14 1533 01/04/14 1144   01/01/14 1530  [MAR Hold]  ciprofloxacin (CIPRO) IVPB 400 mg     (On MAR Hold since 01/01/14 1657)   400 mg 200 mL/hr over 60 Minutes Intravenous  Once 01/01/14 1516 01/03/14 0908   01/01/14 1530  metroNIDAZOLE (FLAGYL) IVPB 500 mg     500 mg 100 mL/hr over 60 Minutes Intravenous  Once 01/01/14 1516 01/01/14 1622        Assessment/Plan 1. S/p SBO with emergent laparotomy, necrotic bowel resection 12/24/13, [place].  2. Post-operative wound abscess, I&D subcutaneous and fascial tissue wound abscess, 01/01/14, Dr. [name]  Prior LAPAROSCOPIC ROUX-EN-Y GASTRIC; Surgeon: [name], MD 07/05/12.  3. Hypertension    4. Migraines  5. Sleep apnea, no CPAP  6. Hx of hiatal hernia and GERD  7. Severe recurrent major depression without psychotic features, PTSD, hospitalize, 8/11-8/19/15 for this.  8. Arthritis  9. Body mass index is 27.98 ( she lost 120 pounds with Roux)  10. Dyslipidemia  11. Severe progressive rash previously attributed to preadmission Bactrim  -ID recommends prednisone for total of 5,  D#3/5.  The prednisone will further delay her wound healing.   -Per IM/psych seroquel was started which she seems to be tolerating OK.  She has been in touch with her psychiatrist and will need close follow up. -Her wound appears dehisced.  I will discuss with Dr. Zella Richer whether to [facility] of continue with wet to dry dressing changes.  It is clean and does not need further antibiotics.      Erby Pian, [name] Pager [phone]) For consults and floor pages call [phone]([id number])  01/06/2014 10:24 AM

## 2014-01-06 NOTE — Progress Notes (Signed)
Patient ID: Brittany [name], female   DOB: July 14, 1965, 48 y.o.   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   Severe recurrent major depression without psychotic features   PTSD (post-traumatic stress disorder)   Normocytic anemia   DJD (degenerative joint disease)   Hypertension   Dyslipidemia   GERD (gastroesophageal reflux disease)   Obstructive sleep apnea   . clonazePAM  1 mg Oral QHS  . DULoxetine  30 mg Oral Daily  . enoxaparin (LOVENOX) injection  40 mg Subcutaneous Q24H  . loratadine  10 mg Oral Daily  . multivitamin with minerals  1 tablet Oral Daily  . pseudoephedrine  120 mg Oral BID  . QUEtiapine  25 mg Oral TID  . saccharomyces boulardii  250 mg Oral BID  . traZODone  50 mg Oral QHS    Subjective: She is feeling a little bit better today. Her itching is less and she notes improvement in her rash. She has been up walking in the hallway and is starting to eat more of her meals.  Objective: Temp:  [97.5 F (36.4 C)-98.1 F (36.7 C)] 98.1 F (36.7 C) (09/25 0548) Pulse Rate:  [59-70] 59 (09/25 0548) Resp:  [16] 16 (09/25 0548) BP: (107-123)/(59-70) 107/59 mmHg (09/25 0548) SpO2:  [93 %-96 %] 96 % (09/25 0548)  General: She is in better spirits  Skin: Her rash continues to improve slowly Lungs: Clear  Cor: Regular S1 and S2 with no murmur Abdomen: Abdominal binder in place. Soft and nontender  Lab Results Lab Results  Component Value Date   WBC 10.0 01/06/2014   HGB 10.6* 01/06/2014   HCT 31.5* 01/06/2014   MCV 94.6 01/06/2014   PLT 652* 01/06/2014    Lab Results  Component Value Date   CREATININE 0.51 01/06/2014   BUN 10 01/06/2014   NA 141 01/06/2014   K 3.8 01/06/2014   CL 106 01/06/2014   CO2 24 01/06/2014     Assessment: Hypersensitivity rash is improving slowly off of the potential offending agents (sulfa antibiotic, Trileptal and Lamictal) and on prednisone. I expect her rash to slowly resolve over several weeks and she may have some superficial desquamation. She does not have any eye ear mucous membrane involvement to suggest Stevens-Johnson syndrome.  Plan: 1. Continue prednisone for about 5 days total 2. Discharge home when she feels up to it 3. I will sign off now but please call us if we can be of further assistance  Michel Bickers, MD [facility] [place] [phone] pager   [phone] cell 01/06/2014, 9:46 AM

## 2014-01-06 NOTE — Progress Notes (Signed)
Patient seen and examined.  Has fascial weakness but no evisceration.  Would is clean.  Will try NPWT.

## 2014-01-07 DIAGNOSIS — L270 Generalized skin eruption due to drugs and medicaments taken internally: Secondary | ICD-10-CM

## 2014-01-07 NOTE — Progress Notes (Signed)
Clinical Social Work  CSW followed up with patient at bedside. Patient laying in bed and watching TV when CSW arrived. Patient reports rash is getting better and she is in much better spirits. Patient reports when she had the rash and was feeling bad that she was tearful and depressed. Patient reports that all passive SI are gone and that she is not crying as often. Patient reports she is feeling happy today but still ready to DC home. Patient spoke with CSW about her family, job, and pets. Patient laughing and joking with CSW and reports she appreciated the visit. CSW will continue to follow.  Sindy Messing, LCSW (Weekend Coverage)

## 2014-01-07 NOTE — Progress Notes (Signed)
PROGRESS NOTE    Brittany [name]:[id number] DOB: December 26, 1965 DOA: 01/01/2014 PCP: Guadlupe Spanish, MD  HPI/Brief narrative 48 year old female with history of HTN, hypercholesterolemia, depression, morbid obesity, OSA, GERD, s/p emergent laparotomy for necrotic bowel resection 12/24/13 at [facility], admitted by surgery on 01/01/14 for postoperative wound abscess, underwent I&D. TRH was consulted on 9/23 for progressively worsening diffuse skin rash-attributed to drug rash. Supposed offending agents-sulfa, Lamictal and Trileptal held. Improving with prednisone.   Assessment/Plan:  1. Skin rash: Highly suspicious for drug eruption. She started sulfa on 9/19, noticed beginning of skin rash on her upper extremities the next day and stopped taking antibiotics on 9/20. Over the next 3 days (including this hospitalization) rash however progressed to involve almost all of her body. Since admission, patient had been on IV vancomycin, Cipro and Flagyl-DC'd on 9/23. There were new psychiatric medications started recently (Lamictal, Trileptal, Klonopin and trazodone). DD for her skin rash includes-sulfa drugs still a possibility despite having stopped it PTA, psych medications-Trileptal and Lamictal, narcotics-only morphine is new. She denies any new environmental exposures. Discontinued antibiotics. Trial of PO prednisone 50 mg daily x5 days-rash gradually improving. Discussed with psychiatry on 9/24-discontinued Trileptal.       By 01/07/2014 patient is showing daily improvement. Plan to stop steroids on 01/08/2014. Otherwise       continues to show daily improvement.  2. Anemia: Stable 3. Anxiety and depression: Management per psychiatry. Advised patient that prednisone can also cause mood swings but she states that she felt like this even before starting prednisone yesterday. 4. Hypertension: Controlled off meds. 5. Thrombocytosis: Possibly reactive.  Will sign off, please call with any questions.    Code Status: Full Family Communication: None at bedside. Disposition Plan: Home when medically stable & as per primary service.   Consultants:  Infectious disease  Psychiatry  Procedures:  Incision and drainage of postoperative abdominal wall abscess 9/20  Antibiotics:  All discontinued 9/23   Subjective: Feels better, reporting improvement to rash.   Objective: Filed Vitals:   01/05/14 2215 01/06/14 0548 01/06/14 2115 01/07/14 0514  BP: 123/70 107/59 125/54 122/70  Pulse: 65 59 58 60  Temp: 98 F (36.7 C) 98.1 F (36.7 C) 98 F (36.7 C) 98.1 F (36.7 C)  TempSrc: Oral Oral Oral Oral  Resp: 16 16 16 16   Height:      Weight:      SpO2: 96% 96% 98% 97%    Intake/Output Summary (Last 24 hours) at 01/07/14 1229 Last data filed at 01/07/14 0600  Gross per 24 hour  Intake   1200 ml  Output   1150 ml  Net     50 ml   Filed Weights   01/01/14 1553  Weight: 88.451 kg (195 lb)     Exam: Patient examined every single day with female RN chaperone in room. General exam: Pleasant young female lying comfortably in bed and appears pleasant and cheerful this morning. Respiratory system: Clear. No increased work of breathing. Cardiovascular system: S1 & S2 heard, RRR. No JVD, murmurs, gallops, clicks or pedal edema. Gastrointestinal system: Abdomen is nondistended, soft and nontender. Normal bowel sounds heard. Central nervous system: Alert and oriented. No focal neurological deficits. Extremities: Symmetric 5 x 5 power. Skin: Diffuse maculopapular rash involving face, neck, front and back of trunk and all extremities, pruritic and coalesced in many areas of trunk. All gradually improving especially over face and upper arms.   Data Reviewed: Basic Metabolic Panel:  Recent Labs  Lab 01/02/14 0456 01/03/14 0515 01/04/14 0455 01/05/14 0545 01/06/14 0455  NA 138 140 138 139 141  K 4.3 3.7 3.8 4.1 3.8  CL 101 105 104 104 106  CO2 27 25 24 23 24   GLUCOSE 199*  114* 104* 139* 116*  BUN 9 12 9 10 10   CREATININE 0.43* 0.52 0.62 0.48* 0.51  CALCIUM 8.5 8.2* 8.0* 8.5 8.4   Liver Function Tests:  Recent Labs Lab 01/01/14 1402  AST 21  ALT 29  ALKPHOS 123*  BILITOT 0.3  PROT 7.2  ALBUMIN 2.7*   No results found for this basename: LIPASE, AMYLASE,  in the last 168 hours No results found for this basename: AMMONIA,  in the last 168 hours CBC:  Recent Labs Lab 01/01/14 1402 01/02/14 0456 01/03/14 0515 01/04/14 0455 01/05/14 0545 01/06/14 0455  WBC 12.4* 11.9* 11.4* 9.6 9.8 10.0  NEUTROABS 11.3*  --   --   --   --   --   HGB 11.3* 10.5* 10.1* 11.2* 11.0* 10.6*  HCT 32.5* 30.5* 30.1* 33.3* 32.3* 31.5*  MCV 93.1 95.0 95.9 96.2 94.2 94.6  PLT 410* 403* 505* 600* 634* 652*   Cardiac Enzymes: No results found for this basename: CKTOTAL, CKMB, CKMBINDEX, TROPONINI,  in the last 168 hours BNP (last 3 results) No results found for this basename: PROBNP,  in the last 8760 hours CBG: No results found for this basename: GLUCAP,  in the last 168 hours  Recent Results (from the past 240 hour(s))  CULTURE, ROUTINE-ABSCESS     Status: None   Collection Time    01/01/14  5:13 PM      Result Value Ref Range Status   Specimen Description ABSCESS MIDLINE INCISION   Final   Special Requests NONE   Final   Gram Stain     Final   Value: FEW WBC PRESENT,BOTH PMN AND MONONUCLEAR     NO SQUAMOUS EPITHELIAL CELLS SEEN     FEW GRAM POSITIVE COCCI     IN PAIRS IN CLUSTERS FEW GRAM NEGATIVE RODS     Performed at [facility]   Culture     Final   Value: MULTIPLE ORGANISMS PRESENT, NONE PREDOMINANT     Note: NO STAPHYLOCOCCUS AUREUS ISOLATED NO GROUP A STREP (S.PYOGENES) ISOLATED     Performed at [facility]   Report Status 01/05/2014 FINAL   Final  ANAEROBIC CULTURE     Status: None   Collection Time    01/01/14  5:13 PM      Result Value Ref Range Status   Specimen Description ABSCESS MIDLINE INCISION   Final   Special Requests  NONE   Final   Gram Stain     Final   Value: FEW WBC PRESENT,BOTH PMN AND MONONUCLEAR     NO SQUAMOUS EPITHELIAL CELLS SEEN     FEW GRAM POSITIVE COCCI     IN PAIRS IN CLUSTERS FEW GRAM NEGATIVE RODS     Performed at [facility]   Culture     Final   Value: NO ANAEROBES ISOLATED     Performed at [facility]   Report Status 01/06/2014 FINAL   Final         Studies: No results found.      Scheduled Meds: . clonazePAM  1 mg Oral QHS  . DULoxetine  30 mg Oral Daily  . enoxaparin (LOVENOX) injection  40 mg Subcutaneous Q24H  . loratadine  10 mg  Oral Daily  . multivitamin with minerals  1 tablet Oral Daily  . predniSONE  50 mg Oral Q breakfast  . pseudoephedrine  120 mg Oral BID  . QUEtiapine  25 mg Oral TID  . saccharomyces boulardii  250 mg Oral BID  . traZODone  50 mg Oral QHS   Continuous Infusions: . 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   Severe recurrent major depression without psychotic features   PTSD (post-traumatic stress disorder)   Abdominal abscess   Wound infection after surgery   Normocytic anemia   DJD (degenerative joint disease)   Hypertension   Dyslipidemia   GERD (gastroesophageal reflux disease)   Obstructive sleep apnea   Status post small bowel resection    Time spent: 25 minutes    Kelvin Cellar, MD, FACP, FHM. [facility] Pager [phone]  If 7PM-7AM, please contact night-coverage [email or web address] Password [facility] 01/07/2014, 12:29 PM    LOS: 6 days

## 2014-01-07 NOTE — Progress Notes (Signed)
6 Days Post-Op  Subjective: Feeling much better overall.  Tolerating regular diet.  Bowels moving.  Objective: Vital signs in last 24 hours: Temp:  [98 F (36.7 C)-98.1 F (36.7 C)] 98.1 F (36.7 C) (09/26 0514) Pulse Rate:  [58-60] 60 (09/26 0514) Resp:  [16] 16 (09/26 0514) BP: (122-125)/(54-70) 122/70 mmHg (09/26 0514) SpO2:  [97 %-98 %] 97 % (09/26 0514) Last BM Date: 01/06/14  Intake/Output from previous day: 09/25 0701 - 09/26 0700 In: 1200 [I.V.:1200] Out: 1550 [Urine:1550] Intake/Output this shift:    PE: General- In NAD Abdomen-VAC on.  Lab Results:   Recent Labs  01/05/14 0545 01/06/14 0455  WBC 9.8 10.0  HGB 11.0* 10.6*  HCT 32.3* 31.5*  PLT 634* 652*   BMET  Recent Labs  01/05/14 0545 01/06/14 0455  NA 139 141  K 4.1 3.8  CL 104 106  CO2 23 24  GLUCOSE 139* 116*  BUN 10 10  CREATININE 0.48* 0.51  CALCIUM 8.5 8.4   PT/INR No results found for this basename: LABPROT, INR,  in the last 72 hours Comprehensive Metabolic Panel:    Component Value Date/Time   NA 141 01/06/2014 0455   NA 139 01/05/2014 0545   K 3.8 01/06/2014 0455   K 4.1 01/05/2014 0545   CL 106 01/06/2014 0455   CL 104 01/05/2014 0545   CO2 24 01/06/2014 0455   CO2 23 01/05/2014 0545   BUN 10 01/06/2014 0455   BUN 10 01/05/2014 0545   CREATININE 0.51 01/06/2014 0455   CREATININE 0.48* 01/05/2014 0545   GLUCOSE 116* 01/06/2014 0455   GLUCOSE 139* 01/05/2014 0545   CALCIUM 8.4 01/06/2014 0455   CALCIUM 8.5 01/05/2014 0545   AST 21 01/01/2014 1402   AST 16 11/22/2013 1320   ALT 29 01/01/2014 1402   ALT 30 11/22/2013 1320   ALKPHOS 123* 01/01/2014 1402   ALKPHOS 71 11/22/2013 1320   BILITOT 0.3 01/01/2014 1402   BILITOT 0.4 11/22/2013 1320   PROT 7.2 01/01/2014 1402   PROT 7.5 11/22/2013 1320   ALBUMIN 2.7* 01/01/2014 1402   ALBUMIN 4.1 11/22/2013 1320     Studies/Results: No results found.  Anti-infectives: Anti-infectives   Start     Dose/Rate Route Frequency Ordered Stop   01/02/14 0800  ciprofloxacin (CIPRO) IVPB 400 mg  Status:  Discontinued     400 mg 200 mL/hr over 60 Minutes Intravenous Every 12 hours 01/01/14 1846 01/04/14 1144   01/01/14 2200  metroNIDAZOLE (FLAGYL) IVPB 500 mg  Status:  Discontinued     500 mg 100 mL/hr over 60 Minutes Intravenous 3 times per day 01/01/14 1846 01/04/14 1144   01/01/14 1600  vancomycin (VANCOCIN) IVPB 1000 mg/200 mL premix  Status:  Discontinued     1,000 mg 200 mL/hr over 60 Minutes Intravenous Every 8 hours 01/01/14 1533 01/04/14 1144   01/01/14 1530  [MAR Hold]  ciprofloxacin (CIPRO) IVPB 400 mg     (On MAR Hold since 01/01/14 1657)   400 mg 200 mL/hr over 60 Minutes Intravenous  Once 01/01/14 1516 01/03/14 0908   01/01/14 1530  metroNIDAZOLE (FLAGYL) IVPB 500 mg     500 mg 100 mL/hr over 60 Minutes Intravenous  Once 01/01/14 1516 01/01/14 1622      Assessment Principal Problem:  1. S/p SBO with emergent laparotomy, necrotic bowel resection 12/24/13, [place].     2. Post-operative wound abscess, I&D subcutaneous and fascial tissue wound abscess, 01/01/14, Dr. Marland Kitchen [place]-wound [facility] on; she feels  much better overall.     3.  Severe recurrent major depression without psychotic features   LOS: 6 days   Plan: Continue VAC and change on Monday.  If would looks good at that time, could be discharged with VAC.   Brittany [name] 01/07/2014

## 2014-01-08 MED ORDER — INFLUENZA VAC SPLIT QUAD 0.5 ML IM SUSY
0.5000 mL | PREFILLED_SYRINGE | INTRAMUSCULAR | Status: AC
  Administered 2014-01-09: 0.5 mL via INTRAMUSCULAR
  Filled 2014-01-08 (×2): qty 0.5

## 2014-01-08 NOTE — Progress Notes (Signed)
7 Days Post-Op  Subjective: Eating well.  Bowels moving.  Tolerating VAC.  Rash improving.  Objective: Vital signs in last 24 hours: Temp:  [97.8 F (36.6 C)-97.9 F (36.6 C)] 97.8 F (36.6 C) (09/27 0600) Pulse Rate:  [58-69] 58 (09/27 0600) Resp:  [16-18] 16 (09/27 0600) BP: (100-132)/(61-82) 100/61 mmHg (09/27 0600) SpO2:  [96 %-100 %] 96 % (09/27 0600) Last BM Date: 01/06/14  Intake/Output from previous day: 09/26 0701 - 09/27 0700 In: 1200 [I.V.:1200] Out: 1500 [Urine:1500] Intake/Output this shift:    PE: General- In NAD Abdomen-VAC on.  Lab Results:   Recent Labs  01/06/14 0455  WBC 10.0  HGB 10.6*  HCT 31.5*  PLT 652*   BMET  Recent Labs  01/06/14 0455  NA 141  K 3.8  CL 106  CO2 24  GLUCOSE 116*  BUN 10  CREATININE 0.51  CALCIUM 8.4   PT/INR No results found for this basename: LABPROT, INR,  in the last 72 hours Comprehensive Metabolic Panel:    Component Value Date/Time   NA 141 01/06/2014 0455   NA 139 01/05/2014 0545   K 3.8 01/06/2014 0455   K 4.1 01/05/2014 0545   CL 106 01/06/2014 0455   CL 104 01/05/2014 0545   CO2 24 01/06/2014 0455   CO2 23 01/05/2014 0545   BUN 10 01/06/2014 0455   BUN 10 01/05/2014 0545   CREATININE 0.51 01/06/2014 0455   CREATININE 0.48* 01/05/2014 0545   GLUCOSE 116* 01/06/2014 0455   GLUCOSE 139* 01/05/2014 0545   CALCIUM 8.4 01/06/2014 0455   CALCIUM 8.5 01/05/2014 0545   AST 21 01/01/2014 1402   AST 16 11/22/2013 1320   ALT 29 01/01/2014 1402   ALT 30 11/22/2013 1320   ALKPHOS 123* 01/01/2014 1402   ALKPHOS 71 11/22/2013 1320   BILITOT 0.3 01/01/2014 1402   BILITOT 0.4 11/22/2013 1320   PROT 7.2 01/01/2014 1402   PROT 7.5 11/22/2013 1320   ALBUMIN 2.7* 01/01/2014 1402   ALBUMIN 4.1 11/22/2013 1320     Studies/Results: No results found.  Anti-infectives: Anti-infectives   Start     Dose/Rate Route Frequency Ordered Stop   01/02/14 0800  ciprofloxacin (CIPRO) IVPB 400 mg  Status:  Discontinued     400 mg 200  mL/hr over 60 Minutes Intravenous Every 12 hours 01/01/14 1846 01/04/14 1144   01/01/14 2200  metroNIDAZOLE (FLAGYL) IVPB 500 mg  Status:  Discontinued     500 mg 100 mL/hr over 60 Minutes Intravenous 3 times per day 01/01/14 1846 01/04/14 1144   01/01/14 1600  vancomycin (VANCOCIN) IVPB 1000 mg/200 mL premix  Status:  Discontinued     1,000 mg 200 mL/hr over 60 Minutes Intravenous Every 8 hours 01/01/14 1533 01/04/14 1144   01/01/14 1530  [MAR Hold]  ciprofloxacin (CIPRO) IVPB 400 mg     (On MAR Hold since 01/01/14 1657)   400 mg 200 mL/hr over 60 Minutes Intravenous  Once 01/01/14 1516 01/03/14 0908   01/01/14 1530  metroNIDAZOLE (FLAGYL) IVPB 500 mg     500 mg 100 mL/hr over 60 Minutes Intravenous  Once 01/01/14 1516 01/01/14 1622      Assessment Principal Problem:  1. S/p SBO with emergent laparotomy, necrotic bowel resection 12/24/13, [place].     2. Post-operative wound abscess, I&D subcutaneous and fascial tissue wound abscess, 01/01/14, Dr. Marland Kitchen [place]-wound [facility] on; she is doing better overall; rash is improving.    3.  Severe recurrent major depression  without psychotic features-mood is much better.   LOS: 7 days   Plan:  VAC change on Monday.  If would looks good at that time, could be discharged with VAC.   Makeila Yamaguchi Lenna Sciara 01/08/2014

## 2014-01-09 ENCOUNTER — Other Ambulatory Visit (HOSPITAL_COMMUNITY): Payer: 59

## 2014-01-09 ENCOUNTER — Telehealth (INDEPENDENT_AMBULATORY_CARE_PROVIDER_SITE_OTHER): Payer: Self-pay | Admitting: General Surgery

## 2014-01-09 MED ORDER — SODIUM CHLORIDE 0.9 % IJ SOLN: 3.0000 mL | INTRAMUSCULAR | Status: DC | PRN

## 2014-01-09 MED ORDER — SACCHAROMYCES BOULARDII 250 MG PO CAPS: ORAL_CAPSULE | ORAL | Status: DC

## 2014-01-09 MED ORDER — DIPHENHYDRAMINE HCL 25 MG PO CAPS: 25.0000 mg | ORAL_CAPSULE | ORAL | Status: DC | PRN

## 2014-01-09 MED ORDER — QUETIAPINE FUMARATE 50 MG PO TABS
50.0000 mg | ORAL_TABLET | Freq: Two times a day (BID) | ORAL | Status: DC
  Filled 2014-01-09: qty 1

## 2014-01-09 MED ORDER — OXYCODONE-ACETAMINOPHEN 5-325 MG PO TABS: 1.0000 | ORAL_TABLET | ORAL | Status: DC | PRN

## 2014-01-09 MED ORDER — SODIUM CHLORIDE 0.9 % IV SOLN: 250.0000 mL | INTRAVENOUS | Status: DC | PRN

## 2014-01-09 MED ORDER — SODIUM CHLORIDE 0.9 % IJ SOLN
3.0000 mL | Freq: Two times a day (BID) | INTRAMUSCULAR | Status: DC
  Administered 2014-01-09: 3 mL via INTRAVENOUS

## 2014-01-09 MED ORDER — QUETIAPINE FUMARATE 50 MG PO TABS: 50.0000 mg | ORAL_TABLET | Freq: Two times a day (BID) | ORAL | Status: DC

## 2014-01-09 NOTE — Progress Notes (Signed)
Physician Discharge Summary  Patient ID: CHAZLYN CUDE MRN: [id number] DOB/AGE: [birth year] 48 y.o.  Admit date: 01/01/2014 Discharge date: 01/09/2014  Admission Diagnoses:  1. S/p SBO with emergent laparotomy, necrotic bowel resection 12/24/13, [place].  2. Post-operative wound abscess, I&D subcutaneous and fascial tissue wound abscess, 01/01/14, Dr. [name]  Prior LAPAROSCOPIC ROUX-EN-Y GASTRIC; Surgeon: [name], MD 07/05/12.  3. Hypertension  4. Migraines  5. Sleep apnea, no CPAP  6. Hx of hiatal hernia and GERD  7. Severe recurrent major depression without psychotic features, PTSD, hospitalize, 8/11-8/19/15 for this.  8. Arthritis  9. Body mass index is 27.98 ( she lost 120 pounds with Roux)  10. Dyslipidemia  11. Severe progressive rash previously attributed to preadmission Bactrim  Discharge Diagnoses:  1. S/p SBO with emergent laparotomy, necrotic bowel resection 12/24/13, [place].  2. Post-operative wound abscess, I&D subcutaneous and fascial tissue wound abscess, 01/01/14, Dr. [name]  Prior LAPAROSCOPIC ROUX-EN-Y GASTRIC; Surgeon: [name], MD 07/05/12.  3. Severe progressive rash previously attributed to preadmission Bactrim 4. Migraines  5. Sleep apnea, no CPAP  6. Hx of hiatal hernia and GERD  7. Severe recurrent major depression without psychotic features, PTSD, hospitalize, 8/11-8/19/15 for this.  8. Arthritis  9. Body mass index is 27.98 ( she lost 120 pounds with Roux)  10. Dyslipidemia  11. 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   Severe recurrent major depression without psychotic features   PTSD (post-traumatic stress disorder)   Abdominal abscess   Obstructive sleep apnea   Normocytic anemia   DJD (degenerative joint disease)  Hypertension   Dyslipidemia   GERD (gastroesophageal reflux disease)   PROCEDURES: IRRIGATION AND DEBRIDEMENT SBCUTANEOUS AND FASCIAL TISSUE WOUND ABSCESS, 01/01/2014, [name], MD.     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 This was done in [place]. She now presents with fever and increasing swelling and discomfort at her midline incision. CT scan has revealed a large wound abscess with fluid and air. There does not appear to be any extension into or source in the peritoneal cavity.she was seen in the ER and admitted by Dr. [name] and taken to the ER.   OR exam showed:  There did not appear to be any tracking or connection deeper into the abdomen although likely there was some exposed healthy omentum at the base of the incision. No evidence of enteric content. On admission she had a diffuse erythematous papular rash over her trunk and extremities.  She was told it was most likely due to the Bactrim she was on.   She was taken off Bactrim and switched over to Vancomycin, flagyl and cipro.  Her wound post op had some odor, and we continued wet to dry's with some Dakins solution for 48 hours.  Her wound did fine but her rash became progressively worse.  By 9/23 her whole face, neck and upper chest had a confluent rash with worsening of her lower chest,abdomen, trunk, both thighs and legs.  At this point I ask Medicine to see and Dr. Algis Liming ask ID to see also.  We stopped all her antibiotics at this  point.  We continued wet to dry with NS after 48 hours of Dakins solution. No specific source was found, everyone agreed it acted like Sulfa and she was started on steroids.  She started showing improvement and her skin had cleared enough we could place a wound vac on 01/06/14.  She is making good progress off antibiotics and on the wound  vac.  Dressing was changed and she is doing well.  Her rash is resolved.  She was seen by Psychiatry on 9/28, and her Seroquel was changed, but nothing else.  We plan to send her home on Wound vac with no other antibiotics.  She will follow up with Dr. [name] in 2 weeks, sooner if there is an issue.  Home health will assist with the wound vac at home.  Condition on d/c:  Improved.     Disposition: 01-Home or Self Care     Medication List    STOP taking these medications       sulfamethoxazole-trimethoprim 800-160 MG per tablet  Commonly known as:  BACTRIM DS      TAKE these medications       acetaminophen 500 MG tablet  Commonly known as:  TYLENOL  Take 1 tablet (500 mg total) by mouth every 6 (six) hours as needed (pain.).     ALAVERT ALLERGY/SINUS 5-120 MG per tablet  Generic drug:  loratadine-pseudoephedrine  Take 1 tablet by mouth 2 (two) times daily.     clonazePAM 1 MG tablet  Commonly known as:  KLONOPIN  Take 1 tablet (1 mg total) by mouth at bedtime. For anxiety     diphenhydrAMINE 25 mg capsule  Commonly known as:  BENADRYL  Take 1 capsule (25 mg total) by mouth every 4 (four) hours as needed for itching.     DULoxetine 30 MG capsule  Commonly known as:  CYMBALTA  Take 1 capsule (30 mg total) by mouth daily. For depression     multivitamin with minerals Tabs tablet  Take 1 tablet by mouth daily. For low vitamin     OXcarbazepine 150 MG tablet  Commonly known as:  TRILEPTAL  Take 1 tablet (150 mg total) by mouth 2 (two) times daily. For mood stabilization     oxyCODONE-acetaminophen 5-325 MG per tablet  Commonly known as:  PERCOCET/ROXICET  Take 1-2 tablets by mouth every 4 (four) hours as needed for moderate pain or severe pain.     saccharomyces boulardii 250 MG capsule  Commonly known as:  FLORASTOR  Use whatever brand you like.  I would do it for a few weeks till your are back to your baseline.     traZODone 50 MG tablet  Commonly known as:   DESYREL  Take 1 tablet (50 mg total) by mouth at bedtime. For sleep       Follow-up Information   Follow up with [facility]. (RN for wound care.)    Contact information:   [address] [place] [address] [phone]       Follow up with Edward Jolly, MD On 01/25/2014. (arrive by 2:15PM for a wound check)    Specialty:  General Surgery   Contact information:   [address] [place] [address] [phone]       Call Guadlupe Spanish, MD. (Let them know what happened and follow up with them.  They need to know about your rash/reaction.)    Specialty:  Internal Medicine   Contact information:  [place] [address] [phone]       Signed: Earnstine Regal 01/09/2014, 3:02 PM

## 2014-01-09 NOTE — Progress Notes (Signed)
Mylene Bow M. Deitrich Steve, MD, FACS General, Bariatric, & Minimally Invasive Surgery [facility]  

## 2014-01-09 NOTE — Progress Notes (Signed)
Alert, nad No c/o  Just found out got approved for wound vac  Soft, nt, nd. [place] in place  [place] for dc once confirm vac approval  Leighton Ruff. Redmond Pulling, MD, FACS General, Bariatric, & Minimally Invasive Surgery [facility] [place]

## 2014-01-09 NOTE — Progress Notes (Signed)
Patient ID: Brittany [name], female   DOB: 1965-06-30, 48 y.o.   MRN: [id number]     [facility]      [address] [place], [place] [address]    Phone: [phone] FAX: [phone]     Subjective: Wants to go home.  No other issues   Objective:  Vital signs:  Filed Vitals:   01/08/14 1400 01/08/14 2130 01/09/14 0520 01/09/14 0751  BP: 128/72 126/79 125/75 115/68  Pulse: 66 62 54 64  Temp: 98 F (36.7 C) 97.9 F (36.6 C) 97.8 F (36.6 C) 97.9 F (36.6 C)  TempSrc: Oral Oral Oral Oral  Resp: 16 18 16 16   Height:      Weight:    202 lb 2.6 oz (91.7 kg)  SpO2: 97% 95% 98% 96%    Last BM Date: 01/08/14  Intake/Output   Yesterday:  09/27 0701 - 09/28 0700 In: 2160 [P.O.:960; I.V.:1200] Out: 6384 [Urine:1650] This shift:  Total I/O In: 240 [P.O.:240] Out: 200 [Urine:200]   Physical Exam: General: Pt awake/alert/oriented x4 in no acute distress Abdomen: Soft.  Nondistended.  Midline wound is beefy red, dehisced, clean, VAC applied.  No evidence of peritonitis.  No incarcerated hernias. 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   Severe recurrent major depression without psychotic features   PTSD (post-traumatic stress disorder)   Abdominal abscess   Wound infection after surgery   Normocytic anemia   DJD (degenerative joint disease)   Hypertension   Dyslipidemia   GERD (gastroesophageal reflux disease)   Obstructive sleep apnea   Status post small bowel resection    Results:   Labs: No results found for this or any previous visit (from the past 48 hour(s)).  Imaging / Studies: No results found.  Medications / Allergies:  Scheduled Meds: . clonazePAM  1 mg Oral QHS  . DULoxetine  30 mg Oral Daily  . enoxaparin (LOVENOX) injection  40 mg Subcutaneous Q24H  . loratadine  10  mg Oral Daily  . multivitamin with minerals  1 tablet Oral Daily  . pseudoephedrine  120 mg Oral BID  . QUEtiapine  25 mg Oral TID  . saccharomyces boulardii  250 mg Oral BID  . traZODone  50 mg Oral QHS   Continuous Infusions: . dextrose 5 % and 0.9 % NaCl with KCl 20 mEq/L 50 mL/hr (01/08/14 1708)   PRN Meds:.diphenhydrAMINE, morphine injection, ondansetron (ZOFRAN) IV, ondansetron, oxyCODONE-acetaminophen, senna-docusate  Antibiotics: Anti-infectives   Start     Dose/Rate Route Frequency Ordered Stop   01/02/14 0800  ciprofloxacin (CIPRO) IVPB 400 mg  Status:  Discontinued     400 mg 200 mL/hr over 60 Minutes Intravenous Every 12 hours 01/01/14 1846 01/04/14 1144   01/01/14 2200  metroNIDAZOLE (FLAGYL) IVPB 500 mg  Status:  Discontinued     500 mg 100 mL/hr over 60 Minutes Intravenous 3 times per day 01/01/14 1846 01/04/14 1144   01/01/14 1600  vancomycin (VANCOCIN) IVPB 1000 mg/200 mL premix  Status:  Discontinued     1,000 mg 200 mL/hr over 60 Minutes Intravenous Every 8 hours 01/01/14 1533 01/04/14 1144   01/01/14 1530  [MAR Hold]  ciprofloxacin (CIPRO) IVPB 400 mg     (On MAR Hold since 01/01/14 1657)   400 mg 200 mL/hr over 60  Minutes Intravenous  Once 01/01/14 1516 01/03/14 0908   01/01/14 1530  metroNIDAZOLE (FLAGYL) IVPB 500 mg     500 mg 100 mL/hr over 60 Minutes Intravenous  Once 01/01/14 1516 01/01/14 1622        Assessment/Plan S/p SBO with emergent laparotomy, necrotic bowel resection 12/24/13, [place].  Post-operative wound abscess, I&D subcutaneous and fascial tissue wound abscess, 01/01/14, Dr. [name]  Prior LAPAROSCOPIC ROUX-EN-Y GASTRIC; Surgeon: [name], MD 07/05/12.  POD#6 -wound VAC, M-W-F, HH consult for home VAC -she will be stable for DC once we have the approval for the wound VAC -mobilize -SCD/lovenox -SLIV -abd binder  Severe recurrent major depression without psychotic features, PTSD, hospitalized, 8/11-8/19/15 for  this.  -psych on board, appreciate assistance -now on duloxetine and seroquel.  Trileptal and lamictal stopped due to rash -has been in communication with her psychiatrist  Rash -likely drug eruption, but unclear which medication actually caused it.   -resolved with discontinuing antibiotics and psych meds Dispo-pending VAC approval     Erby Pian, [facility] Pager [phone](7A-4:30P)   01/09/2014 10:15 AM

## 2014-01-09 NOTE — Progress Notes (Signed)
Discharge instructions reviewed with patient, vital signs are stable, wound vac supplies delivered, wound vac changed this morning and patient tolerated well, patient to follow up with home health, [facility] and primary care physician, questions and concerns answered Neta Mends RN 01-09-2014 15:49pm

## 2014-01-09 NOTE — Consult Note (Signed)
WOC wound consult note Reason for Consult: NPWT VAC dressing change Wound type: surgcial Measurement: see Friday note Wound [id number], pink, moist Drainage (amount, consistency, odor) minimal, serosanguinous in the canister Periwound: intact  Dressing procedure/placement/frequency:1 pc white versafoam used in the base due depth and fragile base. 2 strips of Mepitel used along the wound edges to protect and lessen pain with dressing changes.  1pc of black granufoam used to fill the remainder of the wound bed. Seal obtained at 120mmHG. Pt tolerated well.  IV Pain meds given prior to dressing change.  WOC will follow along with you for support with NPWT VAC dressing changes.  Daneli Butkiewicz [place] RN,CWOCN [phone]

## 2014-01-09 NOTE — Progress Notes (Signed)
Clinical Social Work  CSW followed up with patient at bedside. Patient laying in bed and tearful when CSW arrived. Patient reports she was hopeful to DC home today but there are insurance barriers to care at home so she cannot DC. Patient reports she knows she shouldn't have gotten her hopes up but misses her family and home. Patient reports her mood has been stable and she has been feeling better. Patient denies any SI or HI. Patient reports that her family has been visiting and she knows she will DC soon. CSW encouraged patient to focus on the positive aspects of remaining in the hospital such as 24/7 care and reassurance that her wounds is getting proper care. Patient thanked CSW for visit and agreeable for continued support.  [place], [place] [phone]

## 2014-01-09 NOTE — Discharge Instructions (Signed)
[facility]      [facility] °[phone] ° °OPEN ABDOMINAL SURGERY: POST OP INSTRUCTIONS ° °Always review your discharge instruction sheet given to you by the facility where your surgery was performed. ° °IF YOU HAVE DISABILITY OR FAMILY LEAVE FORMS, YOU MUST BRING THEM TO THE OFFICE FOR PROCESSING.  PLEASE DO NOT GIVE THEM TO YOUR DOCTOR. ° °1. A prescription for pain medication may be given to you upon discharge.  Take your pain medication as prescribed, if needed.  If narcotic pain medicine is not needed, then you may take acetaminophen (Tylenol) or ibuprofen (Advil) as needed. °2. Take your usually prescribed medications unless otherwise directed. °3. If you need a refill on your pain medication, please contact your pharmacy. They will contact our office to request authorization.  Prescriptions will not be filled after 5pm or on week-ends. °4. You should follow a light diet the first few days after arrival home, such as soup and crackers, pudding, etc.unless your doctor has advised otherwise. A high-fiber, low fat diet can be resumed as tolerated.   Be sure to include lots of fluids daily. Most patients will experience some swelling and bruising on the chest and neck area.  Ice packs will help.  Swelling and bruising can take several days to resolve °5. Most patients will experience some swelling and bruising in the area of the incision. Ice pack will help. Swelling and bruising can take several days to resolve..  °6. It is common to experience some constipation if taking pain medication after surgery.  Increasing fluid intake and taking a stool softener will usually help or prevent this problem from occurring.  A mild laxative (Milk of Magnesia or Miralax) should be taken according to package directions if there are no bowel movements after 48 hours. °7.  You may have steri-strips (small skin tapes) in place directly over the incision.  These strips should be left on the skin for 7-10 days.  If your  surgeon used skin glue on the incision, you may shower in 24 hours.  The glue will flake off over the next 2-3 weeks.  Any sutures or staples will be removed at the office during your follow-up visit. You may find that a light gauze bandage over your incision may keep your staples from being rubbed or pulled. You may shower and replace the bandage daily. °8. ACTIVITIES:  You may resume regular (light) daily activities beginning the next day--such as daily self-care, walking, climbing stairs--gradually increasing activities as tolerated.  You may have sexual intercourse when it is comfortable.  Refrain from any heavy lifting or straining until approved by your doctor. °a. You may drive when you no longer are taking prescription pain medication, you can comfortably wear a seatbelt, and you can safely maneuver your car and apply brakes °b. Return to Work: ___________________________________ °9. You should see your doctor in the office for a follow-up appointment approximately two weeks after your surgery.  Make sure that you call for this appointment within a day or two after you arrive home to insure a convenient appointment time. °OTHER INSTRUCTIONS:  °_____________________________________________________________ °_____________________________________________________________ ° °WHEN TO CALL YOUR DOCTOR: °1. Fever over 101.0 °2. Inability to urinate °3. Nausea and/or vomiting °4. Extreme swelling or bruising °5. Continued bleeding from incision. °6. Increased pain, redness, or drainage from the incision. °7. Difficulty swallowing or breathing °8. Muscle cramping or spasms. °9. Numbness or tingling in hands or feet or around lips. ° °The clinic staff is available to   answer your questions during regular business hours.  Please dont hesitate to call and ask to speak to one of the nurses if you have concerns.  For further questions, please visit [email or web address]   Vacuum-Assisted Closure  Therapy Vacuum-assisted closure (VAC) therapy uses a device that removes fluid and germs from wounds to help them heal. It is used on wounds that cannot be closed with stitches. They often heal slowly. Vacuum-assisted therapy helps the wound stay clean and healthy while the open wound slowly grows back together. Vacuum-assisted closure therapy uses a bandage (dressing) that is made of foam. It is put inside the wound. Then, a drape is placed over the wound. This drape sticks to your skin to keep air out, and to protect the wound. A tube is hooked up to a small pump and is attached to the drape. The pump sucks out the fluid and germs. Vacuum-assisted closure therapy can also help reduce the bad smell that comes from the wound. HOW DOES IT WORK?  The vacuum pump pulls fluid through the foam dressing. The dressing may wrinkle during this process. The fluid goes into the tube and away from the wound. The fluid then goes into a container. The fluid in the container must be replaced if it is full or at least once a week, even if the container is not full. The pulling from the pump helps to close the wound and bring better circulation to the wound area. The foam dressing covers and protects the wound. It helps your wound heal faster.  HOW DOES IT FEEL?   You might feel a little pulling when the pump is on.  You might also feel a mild vibrating sensation.  You might feel some discomfort when the dressing is taken off. CAN I MOVE AROUND WITH VACUUM-ASSISTED CLOSURE THERAPY? Yes, it has a backup battery which is used when the machine is not plugged in, as long as the battery is working, you can move freely. WHAT ARE SOME THINGS I MUST KNOW?  Do not turn off the pump yourself, unless instructed to do so by your healthcare provider, such as for bathing.  Do not take off the dressing yourself, unless instructed to do so by your caregiver.  You can wash or shower with the dressing. However, do not take the  pump into the shower. Make sure the wound dressing is protected and covered with plastic. The wound area must stay dry.  Do not turn off the pump for more than 2 hours. If the pump is off for more than 2 hours, your nurse must change your dressing.  Check frequently that the machine is on, that the machine indicates the therapy is on, and that all clamps are open. THE ALARM IS SOUNDING! WHAT SHOULD I DO?   Stay calm.  Do not turn off the pump or do anything with the dressing.  Call your clinic or caregiver right away if the alarm goes off and you cannot fix the problem. Some reasons the alarm might go off include:  The fluid collection container is full.  The battery is low.  The dressing has a leak.  Explain to your caregiver what is happening. Follow the instructions you receive. WHEN SHOULD I CALL FOR HELP?   You have severe pain.  You have difficulty breathing.  You have bleeding that will not stop.  Your wound smells bad.  You have redness, swelling, or fluid leaking from your wound.  Your alarm goes off and you do  not know what to do.  You have a fever.  Your wound itches severely.  Your dressing changes are often painful or bleeding often occurs.  You have diarrhea.  You have a sore throat.  You have a rash around the dressing or anywhere else on your body.  You feel nauseous.  You feel dizzy or weak.  The [facility] machine has been off for more than 2 hours. HOW DO I GET READY TO GO HOME WITH A PUMP?  A trained caregiver will talk to you and answer your questions about your vacuum-assisted closure therapy before you go home. He or she will explain what to expect. A caregiver will come to your home to apply the pump and care for your wound. The at-home caregiver will be available for questions and will come back for the scheduled dressing changes, usually every 48-72 hours (or more often for severely infected wounds). Your at-home caregiver will also come if you  are having an unexpected problem. If you have questions or do not know what to do when you go home, talk to your healthcare provider. Document Released: 03/13/2008 Document Revised: 12/01/2012 Document Reviewed: 03/14/2011 [facility] Patient Information 2015 [place], [place]. This information is not intended to replace advice given to you by your health care provider. Make sure you discuss any questions you have with your health care provider.  Home health nurse will help you with this.

## 2014-01-09 NOTE — Telephone Encounter (Signed)
Patient husband states discharged today and lost rx for what I think is seroquel and oxy.  Would like refills. I offered them to come to er to leave rx. They declined and said they would contact office in am

## 2014-01-09 NOTE — Consult Note (Signed)
[facility] Face-to-Face Psychiatry Consult follow up   Reason for Consult:  Severe rash and medication management Referring Physician:  Dr. Darron Doom  Brittany [name] is an 48 y.o. female. Total Time spent with patient: 30 minutes  Assessment: AXIS I:  Major Depression, Recurrent severe and Post Traumatic Stress Disorder AXIS II:  Deferred AXIS III:   Past Medical History  Diagnosis Date  . Migraine   . Hypertension   . Hypercholesterolemia   . Depression   . Morbid obesity   . Sleep apnea     no CPAP machine   . H/O hiatal hernia   . GERD (gastroesophageal reflux disease)     hx of   . Arthritis     hips, knees and hands   . Anxiety   . PTSD (post-traumatic stress disorder)    AXIS IV:  other psychosocial or environmental problems, problems related to social environment and problems with primary support group AXIS V:  51-60 moderate symptoms  Plan:  No evidence of imminent risk to self or others at present.   Patient does not meet criteria for psychiatric inpatient admission. Supportive therapy provided about ongoing stressors. Continue Seroquel 25 mg PO TID for mood stabilization. Continue Cymbalta 30 mg PO QAm for depression Continue Trazodone 5o mg PO Qhs Change Seroquel 50 mg PO BID Appreciate psychiatric consultation and follow up as clinically required.  May discharge to out patient psychiatric services when medically stable Please contact [phone] if needs further assistance  Subjective:  Brittany [name] is a 48 y.o. female admitted with medication induced severe skin rash. Patient is seen for psychiatric consultion follow up evaluation for depression, PTSD and medication induced skin rash.   Patient rash has been clearing up and reportedly feels much better about it. She has increased depression, anxiety, crying spells and poor sleep last night and asking to adjust medication for better control. She also reported she has good time when her husband and other family members  visited her during this weekend and she hopes to be discharged today. She is not medically cleared and so she can't go home today which made her upset and frustrated. She has been compliant with Seroquel, trazodone and cymbalta with out side effects. She has denied active suicidal or homicidal ideation and no evidence of psychosis.   Past Psychiatric History: Past Medical History  Diagnosis Date  . Migraine   . Hypertension   . Hypercholesterolemia   . Depression   . Morbid obesity   . Sleep apnea     no CPAP machine   . H/O hiatal hernia   . GERD (gastroesophageal reflux disease)     hx of   . Arthritis     hips, knees and hands   . Anxiety   . PTSD (post-traumatic stress disorder)     reports that she quit smoking about 9 years ago. Her smoking use included Cigars. She has never used smokeless tobacco. She reports that she drinks alcohol. She reports that she does not use illicit drugs. Family History  Problem Relation Age of Onset  . Cancer Mother     colon  . Cancer Maternal Aunt     breast  . Depression Father   . Depression Sister   . Alcohol abuse Paternal Uncle   . Bipolar disorder Cousin      Living Arrangements: Spouse/significant other   Abuse/Neglect [employer]) Physical Abuse: Denies Verbal Abuse: Denies Sexual Abuse: Denies Allergies:   Allergies  Allergen Reactions  .  Amoxicillin Hives  . Bactrim [Sulfamethoxazole-Tmp Ds] Rash    ACT Assessment Complete:  NO Objective: Blood pressure 115/68, pulse 64, temperature 97.9 F (36.6 C), temperature source Oral, resp. rate 16, height 5\' 10"  (1.778 m), weight 91.7 kg (202 lb 2.6 oz), SpO2 96.00%.Body mass index is 29.01 kg/(m^2). No results found for this or any previous visit (from the past 72 hour(s)). Labs are reviewed.  Current Facility-Administered Medications  Medication Dose Route Frequency Provider Last Rate Last Dose  . 0.9 %  sodium chloride infusion  250 mL Intravenous PRN Emina Riebock, NP      .  clonazePAM (KLONOPIN) tablet 1 mg  1 mg Oral QHS [name], MD   1 mg at 01/08/14 2148  . diphenhydrAMINE (BENADRYL) capsule 25 mg  25 mg Oral Q4H PRN [name], MD   25 mg at 01/06/14 0917  . DULoxetine (CYMBALTA) DR capsule 30 mg  30 mg Oral Daily [name], MD   30 mg at 01/09/14 1002  . enoxaparin (LOVENOX) injection 40 mg  40 mg Subcutaneous Q24H [name], MD   40 mg at 01/08/14 2149  . loratadine (CLARITIN) tablet 10 mg  10 mg Oral Daily [name], MD   10 mg at 01/09/14 1002  . morphine 2 MG/ML injection 2-4 mg  2-4 mg Intravenous Q2H PRN [name], MD   2 mg at 01/09/14 7353  . multivitamin with minerals tablet 1 tablet  1 tablet Oral Daily [name], MD   1 tablet at 01/09/14 1002  . ondansetron (ZOFRAN) tablet 4 mg  4 mg Oral Q6H PRN [name], MD       Or  . ondansetron [facility]) injection 4 mg  4 mg Intravenous Q6H PRN [name], MD      . oxyCODONE-acetaminophen (PERCOCET/ROXICET) 5-325 MG per tablet 1-2 tablet  1-2 tablet Oral Q4H PRN [name], MD   1 tablet at 01/08/14 2992  . pseudoephedrine (SUDAFED) 12 hr tablet 120 mg  120 mg Oral BID [name], MD   120 mg at 01/09/14 1002  . QUEtiapine (SEROQUEL) tablet 50 mg  50 mg Oral BID Durward Parcel, MD      . saccharomyces boulardii (FLORASTOR) capsule 250 mg  250 mg Oral BID Earnstine Regal, PA-C   250 mg at 01/09/14 1002  . senna-docusate (Senokot-S) tablet 2 tablet  2 tablet Oral QHS PRN Earnstine Regal, PA-C      . sodium chloride 0.9 % injection 3 mL  3 mL Intravenous Q12H Emina Riebock, NP   3 mL at 01/09/14 1105  . sodium chloride 0.9 % injection 3 mL  3 mL Intravenous PRN Emina Riebock, NP      . traZODone (DESYREL) tablet 50 mg  50 mg Oral QHS [name], MD   50 mg at 01/08/14 2149    Psychiatric Specialty Exam: Physical Exam  Skin: Rash is resolving    Review of Systems  Constitutional: Positive for  malaise/fatigue.  Musculoskeletal: Positive for myalgias.  Neurological: Positive for weakness.  Psychiatric/Behavioral: Positive for depression and suicidal ideas. The patient is nervous/anxious and has insomnia.     Blood pressure 115/68, pulse 64, temperature 97.9 F (36.6 C), temperature source Oral, resp. rate 16, height 5\' 10"  (1.778 m), weight 91.7 kg (202 lb 2.6 oz), SpO2 96.00%.Body mass index is 29.01 kg/(m^2).  General Appearance: Casual  Eye Contact::  Good  Speech:  Clear and Coherent and Slow  Volume:  Decreased  Mood:  Anxious,  Depressed and Dysphoric  Affect:  Appropriate and Depressed  Thought Process:  Coherent and Goal Directed  Orientation:  Full (Time, Place, and Person)  Thought Content:  Rumination  Suicidal Thoughts:  Yes.  without intent/plan  Homicidal Thoughts:  No  Memory:  Immediate;   Good Recent;   Good  Judgement:  Good  Insight:  Good  Psychomotor Activity:  Decreased  Concentration:  Good  Recall:  Good  Fund of Knowledge:Good  Language: Good  Akathisia:  NA  Handed:  Right  AIMS (if indicated):     Assets:  Communication Skills Desire for Improvement Financial Resources/Insurance [place] Talents/Skills Transportation  Sleep:      Musculoskeletal: Strength & Muscle Tone: decreased Gait & Station: unable to stand Patient leans: N/A  Treatment Plan Summary: Daily contact with patient to assess and evaluate symptoms and progress in treatment Medication management Continue Cymbalta 30 mg PO Qam for depression  Change Seroquel 50 mg PO BID for mood stabilization Continue Trazodone 50 mg PO Qhs of insomnia May discharge to out patient care when medically stable   Kirat Mezquita,JANARDHAHA R. 01/09/2014 11:14 AM

## 2014-01-10 ENCOUNTER — Other Ambulatory Visit (HOSPITAL_COMMUNITY): Payer: 59

## 2014-01-10 NOTE — Progress Notes (Signed)
Patient's husband called last night around 19:30pm and stated that they could not find the patient's discharge papers, packet or prescriptions, prescriptions were given to patient as well as her discharge instructions upon going over paper work. Room thoroughly searched, patient charted reviewed, front desk searched thoroughly and no prescriptions located, I reprinted patient's discharge instructions for her husband to pick up Neta Mends RN 01-10-2014 7:52am

## 2014-01-11 ENCOUNTER — Other Ambulatory Visit (HOSPITAL_COMMUNITY): Payer: 59

## 2014-01-11 NOTE — Discharge Summary (Signed)
Earnstine Regal, PA-C Physician Assistant Signed Surgery Progress Notes Service date: 01/09/2014 2:56 PM  Physician Discharge Summary   Patient ID: Brittany [name] MRN: [id number] DOB/AGE: 49/22/1967 49 y.o.  Admit date: 01/01/2014 Discharge date: 01/09/2014  Admission Diagnoses:  1. S/p SBO with emergent laparotomy, necrotic bowel resection 12/24/13, [place].   2. Post-operative wound abscess, I&D subcutaneous and fascial tissue wound abscess, 01/01/14, Dr. [name]   Prior LAPAROSCOPIC ROUX-EN-Y GASTRIC; Surgeon: [name], MD 07/05/12.   3. Hypertension   4. Migraines   5. Sleep apnea, no CPAP   6. Hx of hiatal hernia and GERD   7. Severe recurrent major depression without psychotic features, PTSD, hospitalize, 8/11-8/19/15 for this.   8. Arthritis   9. Body mass index is 27.98 ( she lost 120 pounds with Roux)   10. Dyslipidemia   11. Severe progressive rash previously attributed to preadmission Bactrim  Discharge Diagnoses:  1. S/p SBO with emergent laparotomy, necrotic bowel resection 12/24/13, [place].   2. Post-operative wound abscess, I&D subcutaneous and fascial tissue wound abscess, 01/01/14, Dr. [name]   Prior LAPAROSCOPIC ROUX-EN-Y GASTRIC; Surgeon: [name], MD 07/05/12.   3. Severe progressive rash previously attributed to preadmission Bactrim 4. Migraines   5. Sleep apnea, no CPAP   6. Hx of hiatal hernia and GERD   7. Severe recurrent major depression without psychotic features, PTSD, hospitalize, 8/11-8/19/15 for this.   8. Arthritis   9. Body mass index is 27.98 ( she lost 120 pounds with Roux)   10. Dyslipidemia   11. 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   Severe recurrent major depression without psychotic features   PTSD  (post-traumatic stress disorder)   Abdominal abscess   Obstructive sleep apnea   Normocytic anemia   DJD (degenerative joint disease)   Hypertension   Dyslipidemia   GERD (gastroesophageal reflux disease)   PROCEDURES: IRRIGATION AND DEBRIDEMENT SBCUTANEOUS AND FASCIAL TISSUE WOUND ABSCESS, 01/01/2014, [name], MD.     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 This was done in [place]. She now presents with fever and increasing swelling and discomfort at her midline incision. CT scan has revealed a large wound abscess with fluid and air. There does not appear to be any extension into or source in the peritoneal cavity.she was seen in the ER and admitted by Dr. [name] and taken to the ER.   OR exam showed:  There did not appear to be any tracking or connection deeper into the abdomen although likely there was some exposed healthy omentum at the base of the incision. No evidence of enteric content. On admission she had a diffuse erythematous papular rash over her trunk and extremities.  She was told it was most likely due to the Bactrim she was on.   She was taken off Bactrim and switched over to Vancomycin, flagyl and cipro.  Her wound post op had some odor, and we continued wet to dry's with some Dakins solution for 48 hours.  Her wound did fine but her rash became progressively worse.  By 9/23 her whole face, neck and upper chest had  a confluent rash with worsening of her lower chest,abdomen, trunk, both thighs and legs.  At this point I ask Medicine to see and Dr. Algis Liming ask ID to see also.  We stopped all her antibiotics at this point.  We continued wet to dry with NS after 48 hours of Dakins solution. No specific source was found, everyone agreed it acted like Sulfa and she was started on steroids.  She started showing  improvement and her skin had cleared enough we could place a wound vac on 01/06/14.  She is making good progress off antibiotics and on the wound vac.  Dressing was changed and she is doing well.  Her rash is resolved.  She was seen by Psychiatry on 9/28, and her Seroquel was changed, but nothing else.  We plan to send her home on Wound vac with no other antibiotics.  She will follow up with Dr. [name] in 2 weeks, sooner if there is an issue.  Home health will assist with the wound vac at home.  Condition on d/c:  Improved.     Disposition: 01-Home or Self Care      Medication List      STOP taking these medications           sulfamethoxazole-trimethoprim 800-160 MG per tablet   Commonly known as:  BACTRIM DS         TAKE these medications           acetaminophen 500 MG tablet   Commonly known as:  TYLENOL   Take 1 tablet (500 mg total) by mouth every 6 (six) hours as needed (pain.).        ALAVERT ALLERGY/SINUS 5-120 MG per tablet   Generic drug:  loratadine-pseudoephedrine   Take 1 tablet by mouth 2 (two) times daily.        clonazePAM 1 MG tablet   Commonly known as:  KLONOPIN   Take 1 tablet (1 mg total) by mouth at bedtime. For anxiety        diphenhydrAMINE 25 mg capsule   Commonly known as:  BENADRYL   Take 1 capsule (25 mg total) by mouth every 4 (four) hours as needed for itching.        DULoxetine 30 MG capsule   Commonly known as:  CYMBALTA   Take 1 capsule (30 mg total) by mouth daily. For depression        multivitamin with minerals Tabs tablet   Take 1 tablet by mouth daily. For low vitamin        OXcarbazepine 150 MG tablet   Commonly known as:  TRILEPTAL   Take 1 tablet (150 mg total) by mouth 2 (two) times daily. For mood stabilization        oxyCODONE-acetaminophen 5-325 MG per tablet   Commonly known as:  PERCOCET/ROXICET   Take 1-2 tablets by mouth every 4 (four) hours as needed for moderate pain or severe pain.        saccharomyces boulardii  250 MG capsule   Commonly known as:  FLORASTOR   Use whatever brand you like.  I would do it for a few weeks till your are back to your baseline.        traZODone 50 MG tablet   Commonly known as:  DESYREL   Take 1 tablet (50 mg total) by mouth at bedtime. For sleep          Follow-up Information     Follow up with [facility]. (RN for wound care.)      Contact information:     [address]  [place] [address] [phone]           Follow up with Edward Jolly, MD On 01/25/2014. (arrive by 2:15PM for a wound check)      Specialty:  General Surgery     Contact information:     [address]  [place] [address] [phone]           Call Guadlupe Spanish, MD. (Let them know what happened and follow up with them.  They need to know about your rash/reaction.)      Specialty:  Internal Medicine     Contact information:     [address]  [place] [address] [phone]         Signed: Earnstine Regal 01/09/2014, 3:02 PM

## 2014-01-11 NOTE — Discharge Summary (Signed)
Ohn Bostic M. Gila Lauf, MD, FACS General, Bariatric, & Minimally Invasive Surgery [facility]  

## 2014-01-12 ENCOUNTER — Other Ambulatory Visit (HOSPITAL_COMMUNITY): Payer: 59

## 2014-01-13 ENCOUNTER — Other Ambulatory Visit (HOSPITAL_COMMUNITY): Payer: 59

## 2014-01-16 ENCOUNTER — Other Ambulatory Visit (HOSPITAL_COMMUNITY): Payer: 59

## 2014-01-27 ENCOUNTER — Encounter (HOSPITAL_COMMUNITY): Payer: Self-pay | Admitting: Psychiatry

## 2014-01-27 ENCOUNTER — Encounter (INDEPENDENT_AMBULATORY_CARE_PROVIDER_SITE_OTHER): Payer: Self-pay

## 2014-01-27 ENCOUNTER — Ambulatory Visit (INDEPENDENT_AMBULATORY_CARE_PROVIDER_SITE_OTHER): Payer: 59 | Admitting: Psychiatry

## 2014-01-27 VITALS — BP 109/71 | HR 73 | Wt 190.0 lb

## 2014-01-27 DIAGNOSIS — F4310 Post-traumatic stress disorder, unspecified: Secondary | ICD-10-CM

## 2014-01-27 DIAGNOSIS — F331 Major depressive disorder, recurrent, moderate: Secondary | ICD-10-CM

## 2014-01-27 MED ORDER — DULOXETINE HCL 60 MG PO CPEP: 60.0000 mg | ORAL_CAPSULE | Freq: Every day | ORAL | Status: DC

## 2014-01-27 MED ORDER — QUETIAPINE FUMARATE 50 MG PO TABS: 50.0000 mg | ORAL_TABLET | Freq: Two times a day (BID) | ORAL | Status: DC
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
## 2014-02-01 ENCOUNTER — Telehealth (INDEPENDENT_AMBULATORY_CARE_PROVIDER_SITE_OTHER): Payer: Self-pay

## 2014-02-01 NOTE — Telephone Encounter (Signed)
Pt S/P I&D of abscess with wnd vac, drainage remains the same with no change. However the color of the wnd bed has either a necrotic spot or fibers from the wnd vac are embedded in the wound. Ms. Brittany [name] just wanted to make you aware.

## 2014-02-17 ENCOUNTER — Ambulatory Visit (INDEPENDENT_AMBULATORY_CARE_PROVIDER_SITE_OTHER): Payer: 59 | Admitting: Psychiatry

## 2014-02-17 ENCOUNTER — Encounter (HOSPITAL_COMMUNITY): Payer: Self-pay | Admitting: Psychiatry

## 2014-02-17 VITALS — BP 126/62 | HR 60 | Ht 70.0 in | Wt 190.4 lb

## 2014-02-17 DIAGNOSIS — F4310 Post-traumatic stress disorder, unspecified: Secondary | ICD-10-CM

## 2014-02-17 DIAGNOSIS — F331 Major depressive disorder, recurrent, moderate: Secondary | ICD-10-CM

## 2014-02-17 MED ORDER — QUETIAPINE FUMARATE 100 MG PO TABS: 100.0000 mg | ORAL_TABLET | Freq: Every day | ORAL | Status: DC

## 2014-02-17 MED ORDER — DULOXETINE HCL 60 MG PO CPEP: 60.0000 mg | ORAL_CAPSULE | Freq: Every day | ORAL | Status: DC
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
## 2014-03-16 ENCOUNTER — Other Ambulatory Visit (HOSPITAL_COMMUNITY): Payer: Self-pay | Admitting: Psychiatry

## 2014-03-16 DIAGNOSIS — F331 Major depressive disorder, recurrent, moderate: Secondary | ICD-10-CM

## 2014-03-16 MED ORDER — DULOXETINE HCL 60 MG PO CPEP: 60.0000 mg | ORAL_CAPSULE | Freq: Every day | ORAL | Status: DC

## 2014-03-16 MED ORDER — QUETIAPINE FUMARATE 100 MG PO TABS: 100.0000 mg | ORAL_TABLET | Freq: Every day | ORAL | Status: DC

## 2014-03-28 ENCOUNTER — Ambulatory Visit (INDEPENDENT_AMBULATORY_CARE_PROVIDER_SITE_OTHER): Payer: 59 | Admitting: Psychiatry

## 2014-03-28 ENCOUNTER — Encounter (HOSPITAL_COMMUNITY): Payer: Self-pay | Admitting: Psychiatry

## 2014-03-28 VITALS — BP 127/70 | HR 61 | Ht 70.0 in | Wt 193.4 lb

## 2014-03-28 DIAGNOSIS — F331 Major depressive disorder, recurrent, moderate: Secondary | ICD-10-CM

## 2014-03-28 MED ORDER — CLONAZEPAM 1 MG PO TABS: ORAL_TABLET | ORAL | Status: DC
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
## 2014-04-03 ENCOUNTER — Encounter (HOSPITAL_COMMUNITY): Payer: Self-pay | Admitting: Clinical

## 2014-04-03 ENCOUNTER — Ambulatory Visit (INDEPENDENT_AMBULATORY_CARE_PROVIDER_SITE_OTHER): Payer: 59 | Admitting: Clinical

## 2014-04-03 DIAGNOSIS — F4310 Post-traumatic stress disorder, unspecified: Secondary | ICD-10-CM

## 2014-04-03 DIAGNOSIS — F331 Major depressive disorder, recurrent, moderate: Secondary | ICD-10-CM | POA: Diagnosis not present
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
## 2014-04-12 ENCOUNTER — Ambulatory Visit (INDEPENDENT_AMBULATORY_CARE_PROVIDER_SITE_OTHER): Payer: 59 | Admitting: Clinical

## 2014-04-12 ENCOUNTER — Encounter (HOSPITAL_COMMUNITY): Payer: Self-pay | Admitting: Clinical

## 2014-04-12 DIAGNOSIS — F331 Major depressive disorder, recurrent, moderate: Secondary | ICD-10-CM

## 2014-04-12 DIAGNOSIS — F4310 Post-traumatic stress disorder, unspecified: Secondary | ICD-10-CM

## 2014-04-12 NOTE — Progress Notes (Signed)
   THERAPIST PROGRESS NOTE  Session Time: 8:03 -9:15  Participation Level: Active  Behavioral Response: NeatAlertDepressed  Type of Therapy: Individual Therapy  Treatment Goals addressed: Anxiety Depression, Calming skills, Emotional regulation  Interventions: Motivational Interviewing and Other: Grounding and Mindfulness techniques  Summary: Brittany [name] is a 48 y.o. female who presents with PTSD and Depression.   Suicidal/Homicidal: Nowithout intent/plan  Therapist Response:   Brittany [name] met with clinician for an individual session. Brittany [name] discussed her past week's events and her psychiatric symptoms. Brittany [name] share that Christmas was okay but that she has been really hard days since. Brittany [name] share about trying to do paperwork but having thoughts of dread, and hopelessness. She shared that she sometimes feels like giving up. She stated that she would not commit suicide because of her faith and all those who love her. Client and clinician discussed what she would do if she was overwhelmed by the feelings, She stated that she would tell her husband and use her safety plan. Clinician introduced grounding and mindfulness techniques. Client and clinician discussed the purpose of grounding to interrupt thoughts and help her to be more present. Client and clinician discussed the purpose and application. Client and clinician practiced some grounding techniques together. Brittany [name] asked clarifying questions which clinician answered. Brittany [name] agreed to practice the techniques daily until next session.   Plan: Return again in 1 weeks.  Diagnosis: Axis I: PTSD and Depression      , A, LCSW 04/12/2014   

## 2014-04-20 ENCOUNTER — Encounter (HOSPITAL_COMMUNITY): Payer: Self-pay | Admitting: Clinical

## 2014-04-20 ENCOUNTER — Ambulatory Visit (INDEPENDENT_AMBULATORY_CARE_PROVIDER_SITE_OTHER): Payer: 59 | Admitting: Clinical

## 2014-04-20 DIAGNOSIS — F4310 Post-traumatic stress disorder, unspecified: Secondary | ICD-10-CM | POA: Diagnosis not present

## 2014-04-20 NOTE — Progress Notes (Signed)
   THERAPIST PROGRESS NOTE  Session Time: 8:05 -9:05  Participation Level: Active  Behavioral Response: Neat and Well GroomedAlertDepressed  Type of Therapy: Individual Therapy  Treatment Goals addressed: emotional regulation skills, Healthy Coping Skills, challenge and replace unhealthy thoughts and beliefs, calming skills   Interventions: CBT and Other: Grounding and mindfulness techniques  Summary: Brittany [name] is a 49 y.o. female who presents with PTSD.   Suicidal/Homicidal: Nowithout intent/plan  Therapist Response:  Brittany [name] met with clinician for an individual session. Brittany [name] shared about her week's events and her psychiatric symptoms.  Brittany [name] shared that she had practiced her grounding techniques daily and had noticed some relief. She shared that they were not affective all the time but that they were helping. Client and clinician discussed how she could continue to improve her practice and improve her results. Brittany [name] shared about some of the more anxiety provoking events of her week. Clinician introduced cbt and a 7 panel thought record sheet. Client and clinician  Used one of her events as an example and worked through the worksheet together. Brittany [name] listed her emotions, automatic negative thoughts, the evidence for and against those thoughts. Brittany [name] was then able to formulate healthier alternative thoughts. She rerated her emotions as lower. Client and clinician discussed the process and her insights.  Plan: Return again in 1 week.  Diagnosis: Axis I: Post Traumatic Stress Disorder       Catheleen Langhorne A, LCSW 04/20/2014

## 2014-04-21 ENCOUNTER — Ambulatory Visit (HOSPITAL_COMMUNITY): Payer: Self-pay | Admitting: Psychiatry

## 2014-04-26 ENCOUNTER — Encounter (HOSPITAL_COMMUNITY): Payer: Self-pay | Admitting: Psychiatry

## 2014-04-26 ENCOUNTER — Ambulatory Visit (INDEPENDENT_AMBULATORY_CARE_PROVIDER_SITE_OTHER): Payer: 59 | Admitting: Psychiatry

## 2014-04-26 VITALS — BP 122/80 | HR 76 | Ht 70.0 in | Wt 196.0 lb

## 2014-04-26 DIAGNOSIS — F4310 Post-traumatic stress disorder, unspecified: Secondary | ICD-10-CM

## 2014-04-26 DIAGNOSIS — F331 Major depressive disorder, recurrent, moderate: Secondary | ICD-10-CM

## 2014-04-26 MED ORDER — CLONAZEPAM 1 MG PO TABS
ORAL_TABLET | ORAL | Status: DC
Start: 2014-04-26 — End: 2014-06-07
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
## 2014-04-27 ENCOUNTER — Ambulatory Visit (INDEPENDENT_AMBULATORY_CARE_PROVIDER_SITE_OTHER): Payer: 59 | Admitting: Clinical

## 2014-04-27 DIAGNOSIS — F4310 Post-traumatic stress disorder, unspecified: Secondary | ICD-10-CM

## 2014-04-27 DIAGNOSIS — F331 Major depressive disorder, recurrent, moderate: Secondary | ICD-10-CM | POA: Diagnosis not present

## 2014-04-28 ENCOUNTER — Encounter (HOSPITAL_COMMUNITY): Payer: Self-pay | Admitting: Clinical
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
## 2014-05-05 ENCOUNTER — Ambulatory Visit (HOSPITAL_COMMUNITY): Payer: Self-pay | Admitting: Clinical

## 2014-05-23 ENCOUNTER — Ambulatory Visit (INDEPENDENT_AMBULATORY_CARE_PROVIDER_SITE_OTHER): Payer: 59 | Admitting: Clinical

## 2014-05-23 ENCOUNTER — Encounter (HOSPITAL_COMMUNITY): Payer: Self-pay | Admitting: Clinical

## 2014-05-23 DIAGNOSIS — F4310 Post-traumatic stress disorder, unspecified: Secondary | ICD-10-CM | POA: Diagnosis not present

## 2014-05-23 NOTE — Progress Notes (Signed)
   THERAPIST PROGRESS NOTE  Session Time: 2:27 -3:30  Participation Level: Active  Behavioral Response: Casual and NeatAlertDepressed  Type of Therapy: Individual Therapy  Treatment Goals addressed:   Interventions: CBT, Motivational Interviewing and Other: Grounding and Mindfulness techniques  Summary: Brittany [name] is a 49 y.o. female who presents with PTSD.   Suicidal/Homicidal: Nowithout intent/plan  Therapist Response: [name] met with clinician for an individual session. [name] discussed her psychiatric symptoms, current life events, and her gratitude journal. [name] shared that she had some difficult situations that she had dealt with i an improved ( over how she images she would have 3 weeks ago) way. [name] shared about the situations and the areas she saw improvement. [name] shared that she had completed her homework. Client and clinician discussed and reviewed her homework. [name] had completed 2 cbt thought record sheets. [name] shared each, discussed her emotions, negative automatic thoughts,  the evidence that did and did not support the thoughts, and healthier alternative thoughts. One of the worksheets was about a friends diagnosis and the possibility of his death. In addition to what was on her sheet client and clinician discussed thoughts and emotions that surfaced while sharing the sheet. [name] and clinician discussed what was in her power to change and what was not. [name] shared her thoughts about the value of life and friendship.  Plan: Return again in 1 week.  Diagnosis: Axis I: PTSD        [name], LCSW 05/23/2014

## 2014-06-01 ENCOUNTER — Ambulatory Visit (INDEPENDENT_AMBULATORY_CARE_PROVIDER_SITE_OTHER): Payer: 59 | Admitting: Clinical

## 2014-06-01 ENCOUNTER — Encounter (HOSPITAL_COMMUNITY): Payer: Self-pay | Admitting: Clinical

## 2014-06-01 DIAGNOSIS — F4310 Post-traumatic stress disorder, unspecified: Secondary | ICD-10-CM | POA: Diagnosis not present

## 2014-06-01 NOTE — Progress Notes (Signed)
   THERAPIST PROGRESS NOTE  Session Time:  3:33-4:30  Participation Level: Active  Behavioral Response: Casual, Neat and Well GroomedAlertNA  Type of Therapy: Individual Therapy  Treatment Goals addressed: psychiatric symptoms, emotional regulation skills, challenge and replace unhealthy thoughts and beliefs. Interventions: CBT and Motivational Interviewing  Summary: Brittany [name] is a 49 y.o. female who presents with PTSD.   Suicidal/Homicidal: Nowithout intent/plan  Therapist Response:  [name] met with clinician for an individual session. [name] discussed her psychiatric symptoms, her current life events and her homework. [name] shared that she had been feeling okay this past week. She shared that she and her husband had enjoyed a pleasant night out for Valentine's. [name] reported that she had completed two 7 panel cbt worksheets. Client and clinician reviewed and discussed the worksheets. [name] identified two separate events, her emotions, her negative automatic thoughts and the evidence that did and did not support the thoughts, [name] proposed healthier alternative thoughts she could subscribe too. [name] and clinician discussed the process and her thoughts and insights. [name] had the insight that she had been allowing fear hold her back from positive experiences in life. Client and clinician discussed how she could continue to challenge her fears and be more present. [name] also reported positive results from keeping her gratitude journal. She reports noticing positive events more often. She agreed to continue to do her grounding technique homework.    Plan: Return again in 1-2 weeks.  Diagnosis: Axis I: PTSD       Wetzel Meester A, LCSW 06/01/2014

## 2014-06-07 ENCOUNTER — Ambulatory Visit (INDEPENDENT_AMBULATORY_CARE_PROVIDER_SITE_OTHER): Payer: 59 | Admitting: Psychiatry

## 2014-06-07 ENCOUNTER — Encounter (HOSPITAL_COMMUNITY): Payer: Self-pay | Admitting: Psychiatry

## 2014-06-07 VITALS — BP 117/74 | HR 71 | Ht 70.0 in | Wt 199.4 lb

## 2014-06-07 DIAGNOSIS — F331 Major depressive disorder, recurrent, moderate: Secondary | ICD-10-CM

## 2014-06-07 DIAGNOSIS — F339 Major depressive disorder, recurrent, unspecified: Secondary | ICD-10-CM

## 2014-06-07 DIAGNOSIS — F4310 Post-traumatic stress disorder, unspecified: Secondary | ICD-10-CM

## 2014-06-07 MED ORDER — QUETIAPINE FUMARATE 100 MG PO TABS: 100.0000 mg | ORAL_TABLET | Freq: Every day | ORAL | Status: DC

## 2014-06-07 MED ORDER — CLONAZEPAM 1 MG PO TABS
ORAL_TABLET | ORAL | Status: DC
Start: 2014-06-07 — End: 2014-07-14
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
## 2014-06-09 ENCOUNTER — Telehealth (HOSPITAL_COMMUNITY): Payer: Self-pay

## 2014-06-09 NOTE — Telephone Encounter (Signed)
Left message on machine for patient to call back.  Patient left message on nurse voice mail stating she was feeling like she is having a meltdown.  Appointment was changed by Butch Penny to a sooner date.

## 2014-06-09 NOTE — Telephone Encounter (Signed)
Patient reported since seeing Dr. Adele Schilder she had had increased crying spells, more moody and noticed changes that has "hit her like a ton of bricks".  Patient reports she is more depressed and now having periodic "fleeting thoughts" to harm self.  Patient stated " I have no intention on harming myself and no plan" to do so.  Patient reported "I have had thoughts to take an overdose in the past but have not acted on this and do not plan to currently do so".  Patient reported she felt she could keep herself safe but admitted she was having more symptoms of depression.  Patient discussed options to help her with current increased symptoms and agreed it may be helpful to come into [facility] today for an evaluation.  Informed patient of evaluation services open 24 hours a day and also patient could go to one of the emergency departments.  Patient denied any immediate danger to self or others and was adamant she was in no immediate danger to harm herself or others.  Patient stated multiple she had not intent to harm self and agreed would consider coming into [facility] for an evaluation and would consider voluntary admission if continued with symptoms and if began to feel unsafe.  Agreed to send information to Dr. Adele Schilder and patient to call back in the coming week if does not go for an assessment today.  Patient denied current suicidal or homicidal ideations with no plan or intent and stated she could freely contract for safety at this time verbally.  Patient stated she would come in for an assessment if any symptoms changed or worsened or if "fleeting thoughts" to harm herself progressed or worsened.

## 2014-06-12 ENCOUNTER — Encounter (HOSPITAL_COMMUNITY): Payer: Self-pay | Admitting: *Deleted

## 2014-06-12 ENCOUNTER — Inpatient Hospital Stay (HOSPITAL_COMMUNITY)
Admission: AD | Admit: 2014-06-12 | Discharge: 2014-06-15 | DRG: 885 | Disposition: A | Discharge status: Home / Self Care | Payer: 59 | Attending: Psychiatry | Admitting: Psychiatry

## 2014-06-12 DIAGNOSIS — G4733 Obstructive sleep apnea (adult) (pediatric): Secondary | ICD-10-CM | POA: Diagnosis present

## 2014-06-12 DIAGNOSIS — G4710 Hypersomnia, unspecified: Secondary | ICD-10-CM | POA: Diagnosis present

## 2014-06-12 DIAGNOSIS — M1990 Unspecified osteoarthritis, unspecified site: Secondary | ICD-10-CM | POA: Diagnosis present

## 2014-06-12 DIAGNOSIS — E780 Pure hypercholesterolemia: Secondary | ICD-10-CM | POA: Diagnosis present

## 2014-06-12 DIAGNOSIS — R45851 Suicidal ideations: Secondary | ICD-10-CM | POA: Diagnosis present

## 2014-06-12 DIAGNOSIS — F332 Major depressive disorder, recurrent severe without psychotic features: Principal | ICD-10-CM | POA: Diagnosis present

## 2014-06-12 DIAGNOSIS — F4310 Post-traumatic stress disorder, unspecified: Secondary | ICD-10-CM | POA: Diagnosis present

## 2014-06-12 DIAGNOSIS — E785 Hyperlipidemia, unspecified: Secondary | ICD-10-CM | POA: Diagnosis present

## 2014-06-12 DIAGNOSIS — I10 Essential (primary) hypertension: Secondary | ICD-10-CM | POA: Diagnosis present

## 2014-06-12 DIAGNOSIS — Z90710 Acquired absence of both cervix and uterus: Secondary | ICD-10-CM | POA: Diagnosis not present

## 2014-06-12 DIAGNOSIS — Z800 Family history of malignant neoplasm of digestive organs: Secondary | ICD-10-CM

## 2014-06-12 DIAGNOSIS — K219 Gastro-esophageal reflux disease without esophagitis: Secondary | ICD-10-CM | POA: Diagnosis present

## 2014-06-12 DIAGNOSIS — F329 Major depressive disorder, single episode, unspecified: Secondary | ICD-10-CM | POA: Diagnosis present

## 2014-06-12 DIAGNOSIS — Z9884 Bariatric surgery status: Secondary | ICD-10-CM

## 2014-06-12 DIAGNOSIS — Z87891 Personal history of nicotine dependence: Secondary | ICD-10-CM | POA: Diagnosis not present

## 2014-06-12 DIAGNOSIS — F331 Major depressive disorder, recurrent, moderate: Secondary | ICD-10-CM

## 2014-06-12 DIAGNOSIS — Z818 Family history of other mental and behavioral disorders: Secondary | ICD-10-CM | POA: Diagnosis not present

## 2014-06-12 DIAGNOSIS — F32A Depression, unspecified: Secondary | ICD-10-CM | POA: Diagnosis present

## 2014-06-12 DIAGNOSIS — F410 Panic disorder [episodic paroxysmal anxiety] without agoraphobia: Secondary | ICD-10-CM | POA: Diagnosis present

## 2014-06-12 MED ORDER — QUETIAPINE FUMARATE 100 MG PO TABS
100.0000 mg | ORAL_TABLET | Freq: Every day | ORAL | Status: DC
  Administered 2014-06-12: 100 mg via ORAL
  Filled 2014-06-12 (×3): qty 1

## 2014-06-12 MED ORDER — CLONAZEPAM 1 MG PO TABS
1.0000 mg | ORAL_TABLET | Freq: Every day | ORAL | Status: DC
  Administered 2014-06-12 – 2014-06-14 (×3): 1 mg via ORAL
  Filled 2014-06-12 (×3): qty 1

## 2014-06-12 MED ORDER — ALUM & MAG HYDROXIDE-SIMETH 200-200-20 MG/5ML PO SUSP: 30.0000 mL | ORAL | Status: DC | PRN

## 2014-06-12 MED ORDER — MAGNESIUM HYDROXIDE 400 MG/5ML PO SUSP
30.0000 mL | Freq: Every day | ORAL | Status: DC | PRN
  Administered 2014-06-14: 30 mL via ORAL
  Filled 2014-06-12: qty 30

## 2014-06-12 MED ORDER — DULOXETINE HCL 60 MG PO CPEP
60.0000 mg | ORAL_CAPSULE | Freq: Every day | ORAL | Status: DC
  Administered 2014-06-13: 60 mg via ORAL
  Filled 2014-06-12 (×3): qty 1

## 2014-06-12 MED ORDER — ACETAMINOPHEN 325 MG PO TABS: 650.0000 mg | ORAL_TABLET | Freq: Four times a day (QID) | ORAL | Status: DC | PRN
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
## 2014-06-12 NOTE — BHH Group Notes (Signed)
[facility] LCSW Aftercare Discharge Planning Group Note  06/12/2014  8:45 AM  Participation Quality: Did Not Attend. Patient invited to participate but declined.  Tilden Fossa, MSW, [place] Worker [facility] [phone]

## 2014-06-12 NOTE — BHH Group Notes (Signed)
[place] LCSW Group Therapy 06/12/2014  1:15 pm  Type of Therapy: Group Therapy Participation Level: Active  Participation Quality: Attentive, Sharing and Supportive  Affect: Depressed and Flat  Cognitive: Alert and Oriented  Insight: Developing/Improving and Engaged  Engagement in Therapy: Developing/Improving and Engaged  Modes of Intervention: Clarification, Confrontation, Discussion, Education, Exploration,  Limit-setting, Orientation, Problem-solving, Rapport Building, Art therapist, Socialization and Support  Summary of Progress/Problems: Pt identified obstacles faced currently and processed barriers involved in overcoming these obstacles. Pt identified steps necessary for overcoming these obstacles and explored motivation (internal and external) for facing these difficulties head on. Pt further identified one area of concern in their lives and chose a goal to focus on for today. Patient identified her goal as "to get peace of mind from the chaos and muddled thinking brought on my my depression, anxiety, and PTSD". Patient reports that she is her own obstacle due to her self-doubt and lack of confidence in her abilities. CSW and other group members provided patient with emotional support and encouragement.  Tilden Fossa, MSW, [place] Worker [facility] [phone]
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
## 2014-06-13 ENCOUNTER — Ambulatory Visit (HOSPITAL_COMMUNITY): Payer: Self-pay | Admitting: Psychiatry

## 2014-06-13 ENCOUNTER — Ambulatory Visit (HOSPITAL_COMMUNITY): Payer: Self-pay | Admitting: Clinical

## 2014-06-13 LAB — LIPID PANEL
Cholesterol: 232 mg/dL — ABNORMAL HIGH (ref 0–200)
HDL: 59 mg/dL (ref >39)
LDL Cholesterol: 156 mg/dL — ABNORMAL HIGH (ref 0–99)
Total CHOL/HDL Ratio: 3.9 RATIO
Triglycerides: 85 mg/dL (ref <150)
VLDL: 17 mg/dL (ref 0–40)

## 2014-06-13 LAB — RAPID URINE DRUG SCREEN, HOSP PERFORMED
Amphetamines: NOT DETECTED
BARBITURATES: NOT DETECTED
Benzodiazepines: POSITIVE — AB
COCAINE: NOT DETECTED
Opiates: NOT DETECTED
Tetrahydrocannabinol: NOT DETECTED

## 2014-06-13 LAB — CBC
HCT: 36.2 % (ref 36.0–46.0)
Hemoglobin: 12 g/dL (ref 12.0–15.0)
MCH: 31.6 pg (ref 26.0–34.0)
MCHC: 33.1 g/dL (ref 30.0–36.0)
MCV: 95.3 fL (ref 78.0–100.0)
Platelets: 301 10*3/uL (ref 150–400)
RBC: 3.8 MIL/uL — ABNORMAL LOW (ref 3.87–5.11)
RDW: 13.1 % (ref 11.5–15.5)
WBC: 4.5 10*3/uL (ref 4.0–10.5)

## 2014-06-13 LAB — TSH: TSH: 3.46 u[IU]/mL (ref 0.350–4.500)

## 2014-06-13 MED ORDER — QUETIAPINE FUMARATE 50 MG PO TABS
50.0000 mg | ORAL_TABLET | Freq: Every day | ORAL | Status: DC
  Administered 2014-06-13 – 2014-06-14 (×2): 50 mg via ORAL
  Filled 2014-06-13: qty 1
  Filled 2014-06-13: qty 14
  Filled 2014-06-13 (×2): qty 1

## 2014-06-13 MED ORDER — DULOXETINE HCL 60 MG PO CPEP
60.0000 mg | ORAL_CAPSULE | Freq: Two times a day (BID) | ORAL | Status: DC
  Administered 2014-06-13 – 2014-06-15 (×4): 60 mg via ORAL
  Filled 2014-06-13 (×2): qty 1
  Filled 2014-06-13: qty 28
  Filled 2014-06-13 (×3): qty 1
  Filled 2014-06-13: qty 28
  Filled 2014-06-13: qty 1

## 2014-06-13 NOTE — Progress Notes (Signed)
D: Brittany [name] reports fleeting SI but says she feels safe. She is pleasant, cooperative, and contracting for safety. No HI/AVH/pain. She appears motivated for improvement. From her inventory: good sleep and appetite, low energy, poor concentration. She rates her depression a 7, her hopelessness a 5, and her anxiety a 1.   A: Cymbalta requested. No PRNs given/needed. Support and encouragement provided. Baseline ECG performed and put with chart.   R: Pt remains safe and continues with treatment. Will continue to monitor for needs and safety.

## 2014-06-13 NOTE — Progress Notes (Signed)
D: Client is visible on the unit, in dayroom coloring, interacts appropriately with staff and peers. Client reports depression as "6" of 10, "it's been a long day, did a lot of stuff today, seeing the doctor, talking to the social worker, medication changes" Client reports suicidal thought are always there, but "no plans to hurt myself" Client verbally contracts for safety. Goal today was to use coping tools taught by counselor in IOP, i.e. Observing pictures (colors of wall), and being aware of self" A: Writer introduced self to client, provided emotional support,  reviewed medications administered as prescribed. Staff will monitor q16min for safety. R: Client is safe on the unit, attended group.

## 2014-06-13 NOTE — Tx Team (Signed)
Interdisciplinary Treatment Plan Update (Adult) Date: 06/13/2014   Time Reviewed: 9:30 AM  Progress in Treatment: Attending groups: Yes Participating in groups: Yes Taking medication as prescribed: Yes Tolerating medication: Yes Family/Significant other contact made: No, CSW assessing for appropriate contacts Patient understands diagnosis: Yes Discussing patient identified problems/goals with staff: Yes Medical problems stabilized or resolved: Yes Denies suicidal/homicidal ideation: Per RN report, patient experiencing fleeting SI but can contract for safety Issues/concerns per patient self-inventory: Yes Other:  New problem(s) identified: N/A  Discharge Plan or Barriers:  3/1: Patient plans to return home to follow up with outpatient services at [facility].  Reason for Continuation of Hospitalization:  Depression Anxiety Medication Stabilization   Comments: N/A  Estimated length of stay: 3-5 days  For review of initial/current patient goals, please see plan of care.  Patient is a 49 year old Caucasian female admitted with increased depression and SI. Patient lives in [place]. She receives outpatient services from [facility] with Dr. Adele Schilder and [name]. Patient will benefit from crisis stabilization, medication evaluation, group therapy, and psycho education in addition to case management for discharge planning. Patient and CSW reviewed pt's identified goals and treatment plan. Pt verbalized understanding and agreed to treatment plan.  Attendees: Patient:    Family:    Physician: Dr. Parke Poisson; Dr. Sabra Heck 06/13/2014 9:30 AM  Nursing: Lawernce Pitts, Ria Clock [facility] RN 06/13/2014 9:30 AM  Clinical Social Worker: Erasmo Downer Natausha Jungwirth,  [place] 06/13/2014 9:30 AM  Other: Joette Catching, LCSW 06/13/2014 9:30 AM  Other: Maxie Better, LCSWA 06/13/2014 9:30 AM  Other: Lars Pinks, Case Manager 06/13/2014 9:30 AM  Other: Lucinda Dell, [place] Liaison  06/13/2014 9:30 AM      Scribe for Treatment Team:  Tilden Fossa, MSW, [place] [phone]

## 2014-06-13 NOTE — Progress Notes (Signed)
Recreation Therapy Notes  Animal-Assisted Activity/Therapy (AAA/T) Program Checklist/Progress Notes Patient Eligibility Criteria Checklist & Daily Group note for Rec Tx Intervention  Date: 03.01.2016 Time: 2:45pm Location: [address] [employer]   AAA/T Program Assumption of Risk Form signed by Patient/ or Parent Legal Guardian yes  Patient is free of allergies or sever asthma yes  Patient reports no fear of animals yes  Patient reports no history of cruelty to animals yes  Patient understands his/her participation is voluntary yes  Patient washes hands before animal contact yes  Patient washes hands after animal contact yes  Behavioral Response: Appropriate   Education: Hand Washing, Appropriate Animal Interaction   Education Outcome: Acknowledges education.   Clinical Observations/Feedback: Patient interacted appropriately with therapy dog, petting him appropriately.   Laureen Ochs Leanord Thibeau, LRT/CTRS  Lane Hacker 06/13/2014 7:50 PM

## 2014-06-13 NOTE — Progress Notes (Signed)
D: Pt +ve SI-contracts for safety.  Pt denies HI/AVH. Pt is pleasant and cooperative. Pt stated she has the tools to be successful with stress, etc, because she does it with other people, but when it comes to herself she has problems figuring out what to do.   A: Pt was offered support and encouragement. Pt was given scheduled medications. Pt was encourage to attend groups. Q 15 minute checks were done for safety.   R:Pt attends groups and interacts well with peers and staff. Pt is taking medication. Pt has no complaints at this time .Pt receptive to treatment and safety maintained on unit.

## 2014-06-13 NOTE — BHH Suicide Risk Assessment (Signed)
[place] INPATIENT:  Family/Significant Other Suicide Prevention Education  Suicide Prevention Education:  Patient Refusal for Family/Significant Other Suicide Prevention Education: The patient Brittany [name] has refused to provide written consent for family/significant other to be provided Family/Significant Other Suicide Prevention Education during admission and/or prior to discharge.  Physician notified. SPE reviewed with patient and brochure provided. Patient encouraged to return to hospital if having suicidal thoughts, patient verbalized his/her understanding and has no further questions at this time.   Jurni Cesaro, Casimiro Needle 06/13/2014, 1:08 PM

## 2014-06-13 NOTE — Progress Notes (Signed)
Pt attended spiritual care group on grief and loss facilitated by chaplain Jerene Pitch and counseling intern [place] Jehieli Brassell. Group opened with brief discussion and psycho-social ed around grief and loss in relationships and in relation to self - identifying life patterns, circumstances, changes that cause losses. Established group norm of speaking from own life experience. Group goal of establishing open and affirming space for members to share loss and experience with grief, normalize grief experience and provide psycho social education and grief support.  Group drew on narrative and Alderian therapeutic modalities.   Brittany [name] identified anticipatory grief with father and close friend whom both have terminal diagnoses. Father has heart problems, and friend is suffering from stage 4 brain cancer. Strongly related to how difficult it is to know a loss is coming but it has not happened yet. Described how caring and helping others in this time is her way of coping. Also described the suffering experienced by loved ones after someone passes away, and offered this as support towards another group member. Brittany [name] also shared a difficult relationship with sister and how they are no longer close.  [place] Layne Lebon Counseling Intern

## 2014-06-13 NOTE — BHH Group Notes (Signed)
[place] Group Notes:  (Nursing/MHT/Case Management/Adjunct)  Date:  06/13/2014  Time:  10:24 AM  Type of Therapy:  Nurse Education  Participation Level:  Active  Participation Quality:  Appropriate, Attentive and Sharing  Affect:  Appropriate  Cognitive:  Alert, Appropriate and Oriented  Insight:  Appropriate and Good  Engagement in Group:  Engaged  Modes of Intervention:  Discussion, Education and Support  Summary of Progress/Problems: Brittany [name] shared some of her coping techniques for staying grounded. She identified a goal of developing tools to help with her depression.   [name] 06/13/2014, 10:24 AM

## 2014-06-13 NOTE — Telephone Encounter (Signed)
Patient did not show up for her appointment today.

## 2014-06-13 NOTE — BHH Group Notes (Signed)
[place] LCSW Group Therapy 06/13/2014  1:15 PM   Type of Therapy: Group Therapy  Participation Level: Did Not Attend. Patient invited to participate but declined.    Tilden Fossa, MSW, [place] Worker [facility] [phone]
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
## 2014-06-13 NOTE — Clinical Social Work Note (Signed)
CSW left voicemail for IOP case manager regarding getting patient signed up for program prior to discharge.  Tilden Fossa, MSW, [place] Worker [facility] [place] [phone]

## 2014-06-14 DIAGNOSIS — F332 Major depressive disorder, recurrent severe without psychotic features: Principal | ICD-10-CM

## 2014-06-14 LAB — HEMOGLOBIN A1C
Hgb A1c MFr Bld: 5.5 % (ref 4.8–5.6)
Mean Plasma Glucose: 111 mg/dL

## 2014-06-14 NOTE — Progress Notes (Signed)
Pt stated that today she is the first day is hasn't had any suicidal thoughts. She found the [facility] counseling group to be helpful for her.

## 2014-06-14 NOTE — Progress Notes (Signed)
D:Patient in the dayroom on approach.  Patient states she had a great day.  Patient states today is her first day she has not had a suicidal thought.  Patient states she has attended group had had great thoughts today.  Patient states she has been interacting with her peers.  Patient denies SI/HI and denies AVH. A: Staff to monitor Q 15 mins for safety.  Encouragement and support offered.  Scheduled medications administered per orders. R: Patient remains safe on the unit.  Patient attended group tonight.  Patient visible on the unit and interacting with peers.  Patient taking administered medications.

## 2014-06-14 NOTE — Plan of Care (Signed)
Problem: Alteration in mood Goal: LTG-Patient reports reduction in suicidal thoughts (Patient reports reduction in suicidal thoughts and is able to verbalize a safety plan for whenever patient is feeling suicidal)  Outcome: Completed/Met Date Met:  06/14/14 Patient denies SI and reports a change in mood for the better.

## 2014-06-14 NOTE — [facility] Group Notes (Signed)
   [facility] LCSW Aftercare Discharge Planning Group Note  06/14/2014  8:45 AM   Participation Quality: Alert, Appropriate and Oriented  Mood/Affect: Depressed and Flat  Depression Rating: 4-5  Anxiety Rating: 0  Thoughts of Suicide: Pt denies SI/HI  Will you contract for safety? Yes  Current AVH: Pt denies  Plan for Discharge/Comments: Pt attended discharge planning group and actively participated in group. CSW provided pt with today's workbook. Patient reports feeling "fine" today and reports improvements in her mood and SI since admission. She reports that she is interested in completing [facility] IOP program prior to return home with her husband.  Transportation Means: Pt reports access to transportation  Supports: Patient has identified several family members as supportive.  Tilden Fossa, MSW, [place] Worker [facility] [phone]

## 2014-06-14 NOTE — [facility] Group Notes (Signed)
[place] LCSW Group Therapy 06/14/2014  1:15 PM Type of Therapy: Group Therapy Participation Level: Active  Participation Quality: Attentive, Sharing and Supportive  Affect: Depressed and Flat  Cognitive: Alert and Oriented  Insight: Developing/Improving and Engaged  Engagement in Therapy: Developing/Improving and Engaged  Modes of Intervention: Clarification, Confrontation, Discussion, Education, Exploration, Limit-setting, Orientation, Problem-solving, Rapport Building, Art therapist, Socialization and Support  Summary of Progress/Problems: The topic for group today was emotional regulation. This group focused on both positive and negative emotion identification and allowed group members to process ways to identify feelings, regulate negative emotions, and find healthy ways to manage internal/external emotions. Group members were asked to reflect on a time when their reaction to an emotion led to a negative outcome and explored how alternative responses using emotion regulation would have benefited them. Group members were also asked to discuss a time when emotion regulation was utilized when a negative emotion was experienced. Patient discussed experiencing anger and disappointment related to her sister not helping out with family issues. Patient discussed trying to set boundaries and disengage from confrontation with sister. CSW and other group members provided emotional support and encouragement.  Tilden Fossa, MSW, [place] Worker [facility] [phone]

## 2014-06-14 NOTE — Progress Notes (Signed)
Patient ID: Brittany [name], female   DOB: [birth year], 49 y.o.   MRN: [id number]  DAR: Pt. Denies SI/HI and A/V Hallucinations to this Probation officer. Patient states, "this is the first time in two weeks and I feel better." Patient does not report any pain or discomfort at this time. Patient rates her depression at 6/10, hopelessness 5/10, and anxiety 0/10. Patient reports sleep is good, appetite is good, energy level is low, and concentration is good. Support and encouragement provided to the patient. Scheduled medications administered to patient per physician's orders. Patient has not required PRN's as of yet. Patient is receptive and cooperative. Patient is seen in the milieu and is attending groups. Q15 minute checks are maintained for safety.

## 2014-06-14 NOTE — Progress Notes (Addendum)
[facility] MD Progress Note  06/14/2014 3:21 PM Brittany [name]  MRN:  [id number] Subjective:   At this time patient reports she is feeling better- less depressed, and states " I have had no suicidal thoughts today- this is there first day in a long time I have not event thought about it". Denies medication side effects . Objective: I have discussed case with treatment team and have met with patient. Patient is improved compared to admission. She states she is still depressed , and quantifies depression as 4-5/10 at this time, but states she is certainly feeling better. Presents with a fuller range of affect. She states her chronic suicidal ruminations have subsided.  She is tolerating medications well ( Cymbalta was titrated up from 60 mgrs a day up to 60 mgrs BID) .  Behavior on unit calm and in good control. Going to groups and interacting with peers appropriately.  We reviewed medication side effects, and in particular potential for  Weight gain on Seroquel- patient has a history of bariatric surgery with 150+ lbs weight loss. She does state she has regained about 5 lbs on Seroquel, but prefers to continue this medication as it has been helpful. No akathisia or abnormal involuntary movements .  Principal Problem: Severe recurrent major depression without psychotic features Diagnosis:   Patient Active Problem List   Diagnosis Date Noted  . Depression [F32.9] 06/12/2014  . Rash [R21] 01/04/2014  . Normocytic anemia [D64.9] 01/04/2014  . DJD (degenerative joint disease) [M19.90] 01/04/2014  . Hypertension [I10] 01/04/2014  . Dyslipidemia [E78.5] 01/04/2014  . GERD (gastroesophageal reflux disease) [K21.9] 01/04/2014  . Obstructive sleep apnea [G47.33] 01/04/2014  . 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 Abdominal abscess [K65.1] 01/01/2014  . Wound infection after surgery [T81.4XXA] 01/01/2014  . PTSD (post-traumatic stress disorder) [F43.10] 11/23/2013  . Severe recurrent major  depression without psychotic features [F33.2] 11/22/2013  . 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 Morbid obesity, Weight - 312, BMI - 45.2 [E66.01] 04/21/2012   Total Time spent with patient: 25 minutes    Past Medical History:  Past Medical History  Diagnosis Date  . Migraine   . Hypertension   . Hypercholesterolemia   . Depression   . Morbid obesity   . Sleep apnea     no CPAP machine   . H/O hiatal hernia   . GERD (gastroesophageal reflux disease)     hx of   . Arthritis     hips, knees and hands   . Anxiety   . PTSD (post-traumatic stress disorder)     Past Surgical History  Procedure Laterality Date  . Foot surgery    . Breath tek h pylori  05/03/2012    Procedure: BREATH TEK H PYLORI;  Surgeon: [name], MD;  Location: [name];  Service: General;  Laterality: N/A;  . Right tube and ovary removed     . Abdominal hysterectomy  1997 & 2006  . Colonoscopy      hx of benign polyps   . 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 Upper gi endoscopy N/A 07/05/2012    Procedure: UPPER GI ENDOSCOPY;  Surgeon: [name], MD;  Location: [facility];  Service: General;  Laterality: N/A;  . Laparoscopic lysis of adhesions N/A 07/05/2012    Procedure: LAPAROSCOPIC LYSIS OF ADHESIONS;  Surgeon: [name], MD;  Location: [facility];  Service: General;  Laterality: N/A;  repair of abdominal wall hernia  . Irrigation and debridement abscess N/A 01/01/2014    Procedure: IRRIGATION AND DEBRIDEMENT ABSCESS;  Surgeon: [name], MD;  Location: [facility];  Service: General;  Laterality: N/A;  . Gastric bypass     Family History:  Family History  Problem Relation Age of Onset  . Cancer Mother     colon  . Cancer Maternal Aunt     breast  . Depression Father   . Depression Sister   . Post-traumatic stress disorder  Sister   . Alcohol abuse Paternal Uncle   . Bipolar disorder Cousin   . Anxiety disorder Sister    Social History:  History  Alcohol Use No    Comment: Occasional use     History  Drug Use No    History   Social History  . Marital Status: Married    Spouse Name: N/A  . Number of Children: N/A  . Years of Education: N/A   Social History Main Topics  . Smoking status: Former Smoker    Types: Cigars    Quit date: 04/27/2004  . Smokeless tobacco: Never Used  . Alcohol Use: No     Comment: Occasional use  . Drug Use: No  . Sexual Activity: Yes   Other Topics Concern  . None   Social History Narrative   Additional History:    Sleep: Good  Appetite:  Fair   Assessment:   Musculoskeletal: Strength & Muscle Tone: within normal limits Gait & Station: normal Patient leans: N/A   Psychiatric Specialty Exam: Physical Exam  Review of Systems  Constitutional: Positive for weight loss. Negative for fever and chills.  Respiratory: Negative for cough.   Cardiovascular: Negative for chest pain.  Gastrointestinal: Negative for vomiting.  Genitourinary: Negative for dysuria, urgency and frequency.  Skin: Negative for rash.  Psychiatric/Behavioral: Positive for depression and suicidal ideas.    Blood pressure 115/70, pulse 93, temperature 98.5 F (36.9 C), temperature source Oral, resp. rate 15, height 5' 8.25" (1.734 m), weight 198 lb 12 oz (90.152 kg).Body mass index is 29.98 kg/(m^2).  General Appearance: Well Groomed  Engineer, water::  Good  Speech:  Normal Rate  Volume:  Normal  Mood:  improved mood - fuller range of affect   Affect:  improved range of affect   Thought Process:  Goal Directed and Linear  Orientation:  Full (Time, Place, and Person)  Thought Content:  no hallucinations, no delusions   Suicidal Thoughts:  No at this time denies any thoughts of hurting self and  contracts for safety on unit   Homicidal Thoughts:  No  Memory:  recent and remote  grossly intact   Judgement:  Other:  improving   Insight:  Present  Psychomotor Activity:  Normal  Concentration:  Good  Recall:  Good  Fund of Knowledge:Good  Language: Good  Akathisia:  Negative  Handed:  Right  AIMS (if indicated):     Assets:  Communication Skills Desire for Improvement Housing Social Support  ADL's: improving   Cognition: WNL  Sleep:        Current Medications: Current Facility-Administered Medications  Medication Dose Route Frequency Provider Last Rate Last Dose  . acetaminophen (TYLENOL) tablet 650 mg  650 mg Oral Q6H PRN Janett Labella, NP      . alum & mag hydroxide-simeth (MAALOX/MYLANTA) 200-200-20 MG/5ML suspension 30 mL  30 mL  Oral Q4H PRN Freda Munro May Agustin, NP      . clonazePAM Bobbye Charleston) tablet 1 mg  1 mg Oral QHS Janett Labella, NP   1 mg at 06/13/14 2147  . DULoxetine (CYMBALTA) DR capsule 60 mg  60 mg Oral BID Jenne Campus, MD   60 mg at 06/14/14 0801  . magnesium hydroxide (MILK OF MAGNESIA) suspension 30 mL  30 mL Oral Daily PRN Freda Munro May Agustin, NP      . QUEtiapine (SEROQUEL) tablet 50 mg  50 mg Oral QHS Jenne Campus, MD   50 mg at 06/13/14 2147    Lab Results:  Results for orders placed or performed during the hospital encounter of 06/12/14 (from the past 48 hour(s))  Urine rapid drug screen (hosp performed)     Status: Abnormal   Collection Time: 06/13/14  6:27 AM  Result Value Ref Range   Opiates NONE DETECTED NONE DETECTED   Cocaine NONE DETECTED NONE DETECTED   Benzodiazepines POSITIVE (A) NONE DETECTED   Amphetamines NONE DETECTED NONE DETECTED   Tetrahydrocannabinol NONE DETECTED NONE DETECTED   Barbiturates NONE DETECTED NONE DETECTED    Comment:        DRUG SCREEN FOR MEDICAL PURPOSES ONLY.  IF CONFIRMATION IS NEEDED FOR ANY PURPOSE, NOTIFY LAB WITHIN 5 DAYS.        LOWEST DETECTABLE LIMITS FOR URINE DRUG SCREEN Drug Class       Cutoff (ng/mL) Amphetamine      1000 Barbiturate       200 Benzodiazepine   366 Tricyclics       294 Opiates          300 Cocaine          300 THC              50 Performed at [facility]   TSH     Status: None   Collection Time: 06/13/14  6:40 AM  Result Value Ref Range   TSH 3.460 0.350 - 4.500 uIU/mL    Comment: Performed at [facility]  CBC     Status: Abnormal   Collection Time: 06/13/14  6:40 AM  Result Value Ref Range   WBC 4.5 4.0 - 10.5 K/uL   RBC 3.80 (L) 3.87 - 5.11 MIL/uL   Hemoglobin 12.0 12.0 - 15.0 g/dL   HCT 36.2 36.0 - 46.0 %   MCV 95.3 78.0 - 100.0 fL   MCH 31.6 26.0 - 34.0 pg   MCHC 33.1 30.0 - 36.0 g/dL   RDW 13.1 11.5 - 15.5 %   Platelets 301 150 - 400 K/uL    Comment: Performed at [facility]  Lipid panel     Status: Abnormal   Collection Time: 06/13/14  6:40 AM  Result Value Ref Range   Cholesterol 232 (H) 0 - 200 mg/dL   Triglycerides 85 <150 mg/dL   HDL 59 >39 mg/dL   Total CHOL/HDL Ratio 3.9 RATIO   VLDL 17 0 - 40 mg/dL   LDL Cholesterol 156 (H) 0 - 99 mg/dL    Comment:        Total Cholesterol/HDL:CHD Risk Coronary Heart Disease Risk Table                     Men   Women  1/2 Average Risk   3.4   3.3  Average Risk       5.0   4.4  2 X  Average Risk   9.6   7.1  3 X Average Risk  23.4   11.0        Use the calculated Patient Ratio above and the CHD Risk Table to determine the patient's CHD Risk.        ATP III CLASSIFICATION (LDL):  <100     mg/dL   Optimal  100-129  mg/dL   Near or Above                    Optimal  130-159  mg/dL   Borderline  160-189  mg/dL   High  >190     mg/dL   Very High Performed at [facility]   Hemoglobin A1c     Status: None   Collection Time: 06/13/14  6:40 AM  Result Value Ref Range   Hgb A1c MFr Bld 5.5 4.8 - 5.6 %    Comment: (NOTE)         Pre-diabetes: 5.7 - 6.4         Diabetes: >6.4         Glycemic control for adults with diabetes: <7.0    Mean Plasma Glucose 111 mg/dL    Comment:  (NOTE) Performed At: [facility] [place], [place] [id number] Lindon Romp MD [id number] Performed at [facility]     Physical Findings: AIMS:  , ,  ,  ,    CIWA:    COWS:      Assessment- patient improving - less depressed, more reactive affect, and less suicidal ruminations. Tolerating medications well at this time.  Treatment Plan Summary: Daily contact with patient to assess and evaluate symptoms and progress in treatment, Medication management, Plan continue inpatient psychiatric management and continue medications as below  Cymbalta 60 mgrs BID Seroquel 50 mgrs QHS  Klonopin 1 mgr QHS   Medical Decision Making:  Established Problem, Stable/Improving (1), Review of Psycho-Social Stressors (1), Review or order clinical lab tests (1), Review of Last Therapy Session (1) and Review of Medication Regimen & Side Effects (2)     Khaden Gater 06/14/2014, 3:21 PM

## 2014-06-15 ENCOUNTER — Encounter (HOSPITAL_COMMUNITY): Payer: Self-pay | Admitting: Registered Nurse

## 2014-06-15 MED ORDER — DULOXETINE HCL 60 MG PO CPEP: 60.0000 mg | ORAL_CAPSULE | Freq: Two times a day (BID) | ORAL | Status: DC

## 2014-06-15 MED ORDER — CLONAZEPAM 1 MG PO TABS: 1.0000 mg | ORAL_TABLET | Freq: Every day | ORAL | Status: DC

## 2014-06-15 MED ORDER — QUETIAPINE FUMARATE 50 MG PO TABS: 50.0000 mg | ORAL_TABLET | Freq: Every day | ORAL | Status: DC

## 2014-06-15 NOTE — Progress Notes (Signed)
  [facility] Adult Case Management Discharge Plan :  Will you be returning to the same living situation after discharge:  Yes,  patient plans to return home with husband At discharge, do you have transportation home?: Yes,  patient reports that husband will provide transportation Do you have the ability to pay for your medications: Yes,  patient will be provided with prescriptions at discharge  Release of information consent forms completed and in the chart;  Patient's signature needed at discharge.  Patient to Follow up at: Follow-up Information    Follow up with [facility] On 06/19/2014.   Why:  Assessment for IOP program with [name] on Monday, March 7th at 9 am. Please call office if you need to reschedule.   Contact information:   [address] [place] [address] [phone]      Patient denies SI/HI: Yes,  denies    Safety Planning and Suicide Prevention discussed: Yes,  with patient  Have you used any form of tobacco in the last 30 days? (Cigarettes, Smokeless Tobacco, Cigars, and/or Pipes): No  Has patient been referred to the Quitline?: N/A patient is not a smoker  Brittany [name], Casimiro Needle 06/15/2014, 12:22 PM
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
## 2014-06-15 NOTE — Progress Notes (Signed)
Patient ID: Brittany [name], female   DOB: February 02, 1966, 49 y.o.   MRN: [id number]  D: Patient reports mood improvement the past few days. Will go into IOP treatment on Monday. Currently denies any SI at this time and reports will access help if having SI.  A: Obtained all belongings, sample meds, prescriptions, and follow up appointment. R: Husband here to pick-up.

## 2014-06-15 NOTE — [facility] Suicide Risk Assessment (Signed)
[facility] Discharge Suicide Risk Assessment   Demographic Factors:  49 year old married, employed female, two adult children  Total Time spent with patient: 30 minutes  Musculoskeletal: Strength & Muscle Tone: within normal limits Gait & Station: normal Patient leans: N/A  Psychiatric Specialty Exam: Physical Exam  ROS  Blood pressure 107/68, pulse 88, temperature 97.3 F (36.3 C), temperature source Oral, resp. rate 16, height 5' 8.25" (1.734 m), weight 198 lb 12 oz (90.152 kg).Body mass index is 29.98 kg/(m^2).  General Appearance: improved grooming   Eye Contact::  Good  Speech:  Normal Rate409  Volume:  Normal  Mood:  denies depression, euthymic at this time  Affect:  Appropriate and fuller in range   Thought Process:  Goal Directed and Linear  Orientation:  Full (Time, Place, and Person)  Thought Content:  no hallucinations, no delusions   Suicidal Thoughts:  No- she states that over the last two days or so she has had no ongoing SI ( has a history of chronic passive SI  Thoughts ) . Denies any thoughts of wanting to harm herself   Homicidal Thoughts:  No  Memory:   Recent and remote grossly intact   Judgement:  Other:  improved   Insight:  Present  Psychomotor Activity:  Normal  Concentration:  Good  Recall:  Good  Fund of Knowledge:Good  Language: Good  Akathisia:  Negative  Handed:  Right  AIMS (if indicated):     Assets:  Communication Skills Desire for Improvement Housing Resilience Social Support  Sleep:  Number of Hours: 6.75  Cognition: WNL  ADL's:  Improving    Have you used any form of tobacco in the last 30 days? (Cigarettes, Smokeless Tobacco, Cigars, and/or Pipes): No  Has this patient used any form of tobacco in the last 30 days? (Cigarettes, Smokeless Tobacco, Cigars, and/or Pipes) No  Mental Status Per Nursing Assessment::   On Admission:  Suicide plan  Current Mental Status by Physician: At this time she is much improved compared to her  admission. She presents with an improved mood , and at this time her affect is reactive, smiling appropriately, no thought disorder, no ongoing suicidal ideations and she states she feels a significant sense of relief that they have abated , no HI, no psychotic symptoms.   Loss Factors: No acute stressors other than father's poor physical health , has a history of depression.   Historical Factors: History of depression,  No history of suicide attempts, but (+) history of chronic suicidal thoughts , history of bariatric surgery ( 2014)  Risk Reduction Factors:   Sense of responsibility to family, Employed, Living with another person, especially a relative, Positive social support and Positive coping skills or problem solving skills  Continued Clinical Symptoms:  As mentioned , currently much improved, fuller range of affect, no SI or HI, no psychotic symptoms, future oriented.   Cognitive Features That Contribute To Risk:  No gross cognitive deficits noted upon discharge. Is alert , attentive, and oriented x 3   Suicide Risk:  Mild:  Suicidal ideation of limited frequency, intensity, duration, and specificity.  There are no identifiable plans, no associated intent, mild dysphoria and related symptoms, good self-control (both objective and subjective assessment), few other risk factors, and identifiable protective factors, including available and accessible social support.  Principal Problem: Severe recurrent major depression without psychotic features Discharge Diagnoses:  Patient Active Problem List   Diagnosis Date Noted  . Depression [F32.9] 06/12/2014  . Rash [  R21] 01/04/2014  . Normocytic anemia [D64.9] 01/04/2014  . DJD (degenerative joint disease) [M19.90] 01/04/2014  . Hypertension [I10] 01/04/2014  . Dyslipidemia [E78.5] 01/04/2014  . GERD (gastroesophageal reflux disease) [K21.9] 01/04/2014  . Obstructive sleep apnea [G47.33] 01/04/2014  . Status post small bowel resection  [Z98.89] 01/04/2014  . Abdominal abscess [K65.1] 01/01/2014  . Wound infection after surgery [T81.4XXA] 01/01/2014  . PTSD (post-traumatic stress disorder) [F43.10] 11/23/2013  . Severe recurrent major depression without psychotic features [F33.2] 11/22/2013  . 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 Morbid obesity, Weight - 312, BMI - 45.2 [E66.01] 04/21/2012    Follow-up Information    Follow up with [facility].   Why:  Needs appt prior to d/c   Contact information:   [address] [place] [address] [phone]      Plan Of Care/Follow-up recommendations:  Activity:  as tolerated  Diet:  low sodium , heart healthy Tests:  NA Other:  See below  Is patient on multiple antipsychotic therapies at discharge:  No   Has Patient had three or more failed trials of antipsychotic monotherapy by history:  No  Recommended Plan for Multiple Antipsychotic Therapies: NA  Patient is leaving unit in good spirits. Plans to return home. Plans to follow up at [place] ( Dr. Adele Schilder for psychiatric management and Audelia Acton for psychotherapy)  Plans to restart IOP  Next week. Has an established PCP  , Dr. Allayne Gitelman, for medical management as needed .   COBOS, FERNANDO 06/15/2014, 10:51 AM

## 2014-06-15 NOTE — [facility] Group Notes (Signed)
[place] LCSW Group Therapy 06/15/2014 1:15 PM Type of Therapy: Group Therapy Participation Level: Active  Participation Quality: Attentive, Sharing and Supportive  Affect: Appropriate  Cognitive: Alert and Oriented  Insight: Developing/Improving and Engaged  Engagement in Therapy: Developing/Improving and Engaged  Modes of Intervention: Activity, Clarification, Confrontation, Discussion, Education, Exploration, Limit-setting, Orientation, Problem-solving, Rapport Building, Art therapist, Socialization and Support  Summary of Progress/Problems: Patient was attentive and engaged with speaker from [place]. Patient was attentive to speaker while they shared their story of dealing with mental health and overcoming it. Patient expressed interest in their programs and services and received information on their agency. Patient processed ways they can relate to the speaker.   Tilden Fossa, MSW, [place] Worker [facility] [phone]

## 2014-06-15 NOTE — Progress Notes (Signed)
[facility] Group Notes:  (Nursing/MHT/Case Management/Adjunct)  Date:  06/15/2014  Time:  10:35 AM  Type of Therapy:  Psychoeducational Skills  Participation Level:  Active  Participation Quality:  Appropriate and Attentive  Affect:  Appropriate  Cognitive:  Appropriate  Insight:  Appropriate  Engagement in Group:  Engaged  Modes of Intervention:  Activity  Summary of Progress/Problems: Pt played a Therapeutic Activity of "Unique Qualities," in which they tried to guess which qualities belong to which peer. Pt was alert, attentive, and appropriate.  Clint Bolder 06/15/2014, 10:35 AM

## 2014-06-19 ENCOUNTER — Other Ambulatory Visit (HOSPITAL_COMMUNITY): Payer: 59 | Attending: Psychiatry | Admitting: Psychiatry

## 2014-06-19 ENCOUNTER — Encounter (HOSPITAL_COMMUNITY): Payer: Self-pay | Admitting: Psychiatry

## 2014-06-19 DIAGNOSIS — F411 Generalized anxiety disorder: Secondary | ICD-10-CM | POA: Insufficient documentation

## 2014-06-19 DIAGNOSIS — E780 Pure hypercholesterolemia: Secondary | ICD-10-CM | POA: Insufficient documentation

## 2014-06-19 DIAGNOSIS — F332 Major depressive disorder, recurrent severe without psychotic features: Secondary | ICD-10-CM | POA: Diagnosis present

## 2014-06-19 DIAGNOSIS — F331 Major depressive disorder, recurrent, moderate: Secondary | ICD-10-CM

## 2014-06-19 DIAGNOSIS — R45851 Suicidal ideations: Secondary | ICD-10-CM | POA: Diagnosis not present

## 2014-06-19 DIAGNOSIS — F4310 Post-traumatic stress disorder, unspecified: Secondary | ICD-10-CM | POA: Diagnosis not present

## 2014-06-19 DIAGNOSIS — K219 Gastro-esophageal reflux disease without esophagitis: Secondary | ICD-10-CM | POA: Diagnosis not present

## 2014-06-19 DIAGNOSIS — I10 Essential (primary) hypertension: Secondary | ICD-10-CM | POA: Insufficient documentation

## 2014-06-19 DIAGNOSIS — G4730 Sleep apnea, unspecified: Secondary | ICD-10-CM | POA: Insufficient documentation

## 2014-06-19 NOTE — Progress Notes (Signed)
Patient Discharge Instructions:  Next Level Care Provider Has Access to the EMR, 06/19/14  Records provided to [facility] via [facility]/Epic access.  Patsey Berthold, 06/19/2014, 3:38 PM

## 2014-06-19 NOTE — Progress Notes (Signed)
    Daily Group Progress Note  Program: IOP  Group Time: 9:00-10:30  Participation Level: Active  Behavioral Response: Appropriate  Type of Therapy:  Group Therapy  Summary of Progress: Pt. Met with case manager     Group Time: 10:30-12:00  Participation Level:  Active  Behavioral Response: Appropriate  Type of Therapy: Psycho-education Group  Summary of Progress: Pt. Met with case Freight forwarder.   Nancie Neas, LPC

## 2014-06-19 NOTE — Addendum Note (Signed)
Addended by: Sherlyn Hay on: 06/19/2014 04:42 PM   Modules accepted: Medications
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
## 2014-06-20 ENCOUNTER — Other Ambulatory Visit (HOSPITAL_COMMUNITY): Payer: 59 | Admitting: Psychiatry

## 2014-06-20 ENCOUNTER — Encounter (HOSPITAL_COMMUNITY): Payer: Self-pay | Admitting: Psychiatry

## 2014-06-20 DIAGNOSIS — F332 Major depressive disorder, recurrent severe without psychotic features: Secondary | ICD-10-CM
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
## 2014-06-20 NOTE — Progress Notes (Signed)
    Daily Group Progress Note  Program: IOP  Group Time: 1443-1540  Participation Level: Active  Behavioral Response: Appropriate and Sharing  Type of Therapy:  Group Therapy  Summary of Progress: Pt was with the psychiatrist most of the time.  Pt shared that she transferred from the inpatient unit.  Admitted due to worsening depressive symptoms.  "I wasn't being honest with my providers as to how I was really feeling."     Group Time: 1045-1200  Participation Level:  Active  Behavioral Response: Appropriate and Sharing  Type of Therapy: Psycho-education Group  Summary of Progress: [employer] ([place]) shared all the services they provide for free.

## 2014-06-21 ENCOUNTER — Other Ambulatory Visit (HOSPITAL_COMMUNITY): Payer: 59 | Admitting: Psychiatry

## 2014-06-21 DIAGNOSIS — F332 Major depressive disorder, recurrent severe without psychotic features: Secondary | ICD-10-CM | POA: Diagnosis not present

## 2014-06-22 ENCOUNTER — Other Ambulatory Visit (HOSPITAL_COMMUNITY): Payer: 59 | Admitting: Psychiatry

## 2014-06-22 DIAGNOSIS — F332 Major depressive disorder, recurrent severe without psychotic features: Secondary | ICD-10-CM

## 2014-06-22 NOTE — Progress Notes (Signed)
    Daily Group Progress Note  Program: IOP  Group Time:  9:00-10:30  Participation Level: Active  Behavioral Response: Appropriate  Type of Therapy:  Group Therapy  Summary of Progress: Pt. Was alert and attentive. Pt. Reported that she felt mildly anxious.     Group Time: 10:30-12:00  Participation Level:  Active  Behavioral Response: Appropriate  Type of Therapy: Psycho-education Group  Summary of Progress: Pt. Participated in discussion about developing stress management behaviors and skills.   Nancie Neas, LPC

## 2014-06-22 NOTE — Progress Notes (Signed)
    Daily Group Progress Note  Program: IOP  Group Time: 9:00-10:30  Participation Level: Active  Behavioral Response: Appropriate  Type of Therapy:  Group Therapy  Summary of Progress: Pt. Was alert, attentive, appeared mildly anxious. Pt did not share in group.     Group Time: 10:30-12:00  Participation Level:  Active  Behavioral Response: Appropriate  Type of Therapy: Group Therapy  Summary of Progress: Pt. Participated in discussion and instruction about grounding exercises including 5-4-3-2-1, tapping, and bumble bee breath.   Nancie Neas, LPC

## 2014-06-23 ENCOUNTER — Other Ambulatory Visit (HOSPITAL_COMMUNITY): Payer: 59 | Admitting: Psychiatry

## 2014-06-23 DIAGNOSIS — F332 Major depressive disorder, recurrent severe without psychotic features: Secondary | ICD-10-CM | POA: Diagnosis not present

## 2014-06-23 NOTE — Progress Notes (Signed)
    Daily Group Progress Note  Program: IOP  Group Time: 9:00-10:30  Participation Level: Active  Behavioral Response: Appropriate  Type of Therapy:  Group Therapy  Summary of Progress: Pt. Presented with primarily flat affect, smiled occasionally. Pt. Discussed concern about her husband's health and anticipating the need to become more active when he has surgery.      Group Time: 10:30-12:00  Participation Level:  Active  Behavioral Response: Appropriate  Type of Therapy: Psycho-education Group  Summary of Progress: Pt. Watched and discussed video on positive psychology by Orvan Seen and suggestions for being happier including expressing gratitude, random acts of kindness, exercise, meditation, and journaling.  Nancie Neas, LPC

## 2014-06-26 ENCOUNTER — Other Ambulatory Visit (HOSPITAL_COMMUNITY): Payer: 59 | Admitting: Psychiatry

## 2014-06-26 DIAGNOSIS — F332 Major depressive disorder, recurrent severe without psychotic features: Secondary | ICD-10-CM | POA: Diagnosis not present

## 2014-06-26 NOTE — Progress Notes (Signed)
    Daily Group Progress Note  Program: IOP  Group Time: 9:00-10:30  Participation Level: Active  Behavioral Response: Appropriate  Type of Therapy:  Psycho-education Group  Summary of Progress: Pt. Participated in medication education group with [place].      Group Time: 10:30-12:00  Participation Level:  Active  Behavioral Response: Appropriate  Type of Therapy: Group Therapy  Summary of Progress: Pt. Reported that she was doing "ok" and that she did "alright" over the weekend with a few ups and downs. Pt. 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 Pt. Reported that she had to force herself at times to do things that needed to be done around her home.   Nancie Neas, LPC

## 2014-06-27 ENCOUNTER — Other Ambulatory Visit (HOSPITAL_COMMUNITY): Payer: 59 | Admitting: Psychiatry

## 2014-06-27 DIAGNOSIS — F332 Major depressive disorder, recurrent severe without psychotic features: Secondary | ICD-10-CM

## 2014-06-28 ENCOUNTER — Other Ambulatory Visit (HOSPITAL_COMMUNITY): Payer: 59 | Admitting: Psychiatry

## 2014-06-28 DIAGNOSIS — F332 Major depressive disorder, recurrent severe without psychotic features: Secondary | ICD-10-CM

## 2014-06-28 NOTE — Progress Notes (Signed)
    Daily Group Progress Note  Program: IOP  Group Time: 9:00-10:30  Participation Level: Active  Behavioral Response: Appropriate  Type of Therapy:  Group Therapy  Summary of Progress: Pt. Reported that she was "doing good". Pt. Reported that she was happy that she was able to spend time with her grandchildren. Pt. Continues to anticipate new household and caregiving challenges after her husband's surgery that is scheduled for next week.     Group Time: 10:30-12:00  Participation Level:  Active  Behavioral Response: Appropriate  Type of Therapy: Psycho-education Group  Summary of Progress: Pt. Participated in discussion about developing self-care/ comfort packages.  Nancie Neas, LPC

## 2014-06-28 NOTE — Progress Notes (Signed)
    Daily Group Progress Note  Program: IOP  Group Time: 9:00-10:30  Participation Level: Active  Behavioral Response: Appropriate  Type of Therapy:  Group Therapy  Summary of Progress: Pt. Reported that she was "doing fine". Pt. Reported that she was feeling better today compared to yesterday. Pt. Reported that her husband has been giving her projects to do around the house to help keep her busy which was good.      Group Time: 10:30-12:00  Participation Level:  Active  Behavioral Response: Appropriate  Type of Therapy: Psycho-education Group  Summary of Progress: Pt. Participated in progressive muscle relaxation activity.   Nancie Neas, LPC

## 2014-06-29 ENCOUNTER — Other Ambulatory Visit (HOSPITAL_COMMUNITY): Payer: 59 | Admitting: Psychiatry

## 2014-06-29 DIAGNOSIS — F332 Major depressive disorder, recurrent severe without psychotic features: Secondary | ICD-10-CM

## 2014-06-29 NOTE — Progress Notes (Signed)
Patient ID: [name], female   DOB: December 09, 1965, 49 y.o.   MRN: [id number] Discharge Note  Patient:  Brittany [name] is an 49 y.o., female DOB:  1965-08-19  Date of Admission:  06/19/2014  Date of Discharge  06/30/2014  Reason for Admission:depression and obsessive suicidal thoughts  IOP Course:  Ms Linares participated in group and benefited from the experience she said.  She is much less depressed, has had no Suicidal thoughts, feels relieved, has been keeping herself active with positive activities.  Mental Status at [place], oriented, euthymic, less depressed, no suicidal thinking, no psychosis.  Looking forward to her future  Lab Results: No results found for this or any previous visit (from the past 48 hour(s)).   Current outpatient prescriptions:  .  Biotin 1000 MCG tablet, Take 1,000 mcg by mouth daily., Disp: , Rfl:  .  calcium citrate (CALCITRATE - DOSED IN MG ELEMENTAL CALCIUM) 950 MG tablet, Take 200 mg of elemental calcium by mouth daily., Disp: , Rfl:  .  cholecalciferol (VITAMIN D) 1000 UNITS tablet, Take 1,000 Units by mouth daily. , Disp: , Rfl:  .  clonazePAM (KLONOPIN) 1 MG tablet, Take 1/2 tab daily as needed and 1 tab at bed time, Disp: 45 tablet, Rfl: 1 .  clonazePAM (KLONOPIN) 1 MG tablet, Take 1 tablet (1 mg total) by mouth at bedtime. For anxiety, Disp: 30 tablet, Rfl: 0 .  DULoxetine (CYMBALTA) 60 MG capsule, Take 1 capsule (60 mg total) by mouth 2 (two) times daily. For depression, Disp: 60 capsule, Rfl: 0 .  Multiple Vitamin (MULTIVITAMIN WITH MINERALS) TABS tablet, Take 1 tablet by mouth daily. For low vitamin, Disp: , Rfl:  .  QUEtiapine (SEROQUEL) 50 MG tablet, Take 1 tablet (50 mg total) by mouth at bedtime. For mood stabilization, Disp: 30 tablet, Rfl: 0 .  vitamin B-12 (CYANOCOBALAMIN) 500 MCG tablet, Take 1,500 mcg by mouth daily. , Disp: , Rfl:   Axis Diagnosis:  Major depression, recurrent, moderate.  Generalized anxiety disorder with obsessive  compulsive features   Level of Care:  IOP  Discharge destination:  Other:  return to outpatient psychiatrist and therapist  Is patient on multiple antipsychotic therapies at discharge:  No    Has Patient had three or more failed trials of antipsychotic monotherapy by history:  Negative  Patient phone:  [phone] (home)  Patient address:   [address] [place] [address],   Follow-up recommendations:  Activity:  resume usual activity Diet:  resume usual diet  Comments:  Seems back to her usual self.  The suicidal thoughts still seem to be part of her OCD symptoms as opposed to depressive suicidal thoughts  The patient received suicide prevention pamphlet:  Yes Belongings returned:  na  Clarene Reamer 06/29/2014, 1:23 PM

## 2014-06-29 NOTE — Progress Notes (Signed)
    Daily Group Progress Note  Program: IOP  Group Time: 0802-2336  Participation Level: Active  Behavioral Response: Appropriate, Sharing and Motivated  Type of Therapy:  Group Therapy  Summary of Progress: Pt voiced that she is on the road to recovery.  Reports feeling more in control.  States she hasn't had any SI in weeks.  Pt provided a lot of  Supportive feedback to peers.     Group Time: 1045-1200  Participation Level:  Active  Behavioral Response: Appropriate and Sharing  Type of Therapy: Psycho-education Group  Summary of Progress: Discharge Planning:  Discussed setting treatment goals for MH-IOP, assessing current resources, exploring new resources, making a plan for keeping yourself safe while in MH-IOP and after discharge.

## 2014-06-30 ENCOUNTER — Other Ambulatory Visit (HOSPITAL_COMMUNITY): Payer: 59 | Admitting: Psychiatry

## 2014-06-30 DIAGNOSIS — F332 Major depressive disorder, recurrent severe without psychotic features: Secondary | ICD-10-CM

## 2014-06-30 NOTE — Progress Notes (Signed)
    Daily Group Progress Note  Program: IOP  Group Time: 9:00-10:30  Participation Level: Active  Behavioral Response: Appropriate  Type of Therapy:  Group Therapy  Summary of Progress: Pt. Smiled and laughed appropriately. Pt. Reported that she was feeling "good". Pt. Reported readiness for discharge and continued work with her therapist on her PTSD.     Group Time: 10:30-12:00  Participation Level:  Active  Behavioral Response: Appropriate  Type of Therapy: Psycho-education Group  Summary of Progress: Pt. Participated in grief and loss group facilitated by Jeanella Craze.   Nancie Neas, LPC

## 2014-06-30 NOTE — Patient Instructions (Signed)
Pt completed MH-IOP today.  Will follow up with Dr. Adele Schilder on 07-04-14 @ 11:30 a.m and Audelia Acton, LCSW on 07-14-14 @ 9 a.m.  Encouraged support groups.  Return to work on 07-10-14; without restrictions.
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
## 2014-07-03 ENCOUNTER — Other Ambulatory Visit (HOSPITAL_COMMUNITY): Payer: 59

## 2014-07-04 ENCOUNTER — Ambulatory Visit (HOSPITAL_COMMUNITY): Payer: Self-pay | Admitting: Psychiatry

## 2014-07-04 ENCOUNTER — Other Ambulatory Visit (HOSPITAL_COMMUNITY): Payer: 59

## 2014-07-05 ENCOUNTER — Other Ambulatory Visit (HOSPITAL_COMMUNITY): Payer: 59

## 2014-07-06 ENCOUNTER — Other Ambulatory Visit (HOSPITAL_COMMUNITY): Payer: 59

## 2014-07-07 ENCOUNTER — Other Ambulatory Visit (HOSPITAL_COMMUNITY): Payer: 59

## 2014-07-10 ENCOUNTER — Other Ambulatory Visit (HOSPITAL_COMMUNITY): Payer: 59

## 2014-07-11 ENCOUNTER — Other Ambulatory Visit (HOSPITAL_COMMUNITY): Payer: 59

## 2014-07-12 ENCOUNTER — Other Ambulatory Visit (HOSPITAL_COMMUNITY): Payer: 59

## 2014-07-13 ENCOUNTER — Other Ambulatory Visit (HOSPITAL_COMMUNITY): Payer: 59

## 2014-07-14 ENCOUNTER — Other Ambulatory Visit (HOSPITAL_COMMUNITY): Payer: 59

## 2014-07-14 ENCOUNTER — Ambulatory Visit (INDEPENDENT_AMBULATORY_CARE_PROVIDER_SITE_OTHER): Payer: 59 | Admitting: Psychiatry

## 2014-07-14 ENCOUNTER — Encounter (HOSPITAL_COMMUNITY): Payer: Self-pay | Admitting: Psychiatry

## 2014-07-14 ENCOUNTER — Ambulatory Visit (HOSPITAL_COMMUNITY): Payer: Self-pay | Admitting: Clinical

## 2014-07-14 DIAGNOSIS — F331 Major depressive disorder, recurrent, moderate: Secondary | ICD-10-CM

## 2014-07-14 DIAGNOSIS — F339 Major depressive disorder, recurrent, unspecified: Secondary | ICD-10-CM

## 2014-07-14 DIAGNOSIS — F4310 Post-traumatic stress disorder, unspecified: Secondary | ICD-10-CM
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
## 2014-07-17 ENCOUNTER — Other Ambulatory Visit (HOSPITAL_COMMUNITY): Payer: 59

## 2014-07-18 ENCOUNTER — Other Ambulatory Visit (HOSPITAL_COMMUNITY): Payer: 59

## 2014-07-19 ENCOUNTER — Ambulatory Visit (INDEPENDENT_AMBULATORY_CARE_PROVIDER_SITE_OTHER): Payer: 59 | Admitting: Clinical

## 2014-07-19 ENCOUNTER — Other Ambulatory Visit (HOSPITAL_COMMUNITY): Payer: 59

## 2014-07-19 ENCOUNTER — Encounter (HOSPITAL_COMMUNITY): Payer: Self-pay | Admitting: Clinical

## 2014-07-19 DIAGNOSIS — F331 Major depressive disorder, recurrent, moderate: Secondary | ICD-10-CM

## 2014-07-19 DIAGNOSIS — F4310 Post-traumatic stress disorder, unspecified: Secondary | ICD-10-CM | POA: Diagnosis not present
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
## 2014-07-19 NOTE — Progress Notes (Signed)
   THERAPIST PROGRESS NOTE  Session Time: 7:05- 8:02  Participation Level: Active  Behavioral Response: Casual and NeatAlertNA   Type of Therapy: Individual Therapy  Treatment Goals addressed: improve psychiatric symptoms,  Healthy Coping Skills,    Interventions:  Motivational Interviewing,  Summary: Brittany [name] is a 49 y.o female who presents with   PTSD and Major depressive disorder, recurrent episode, moderate  Suicidal/Homicidal: No -without intent/plan  Therapist Response:  Clinician update Brittany [name]'s assessment to refect recent changes Brittany [name] met with clinician for an individual session. Brittany [name] discussed her psychiatric symptoms and her current life events. Brittany [name] shared that she had been having more severe symptoms than she had admitted to and that she  (06/12/14 - 06/15/14) volunteered for inpatient treatment for suicidal ideation and medication management. Brittany [name] shared that she had been experiencing greater depression than she had been able to share. She reported that she was having a lot of recurring thoughts of death and overwhelming feelings of helplessness. She reported that her medications were adjusted and she has been feeling a lot more stable. She shared that she participated in IOP and has been practicing the skills that were taught. Brittany [name] shared that she believes she needs to continue to learning and using coping skills and to work on her PTSD. Brittany [name] shared that a dear friend had passed. She discussed her process of dealing with her emotions and her plans to attend the funeral. Brittany [name] also shared about her fathers health and her concerns for him. Client and clinician discussed how to continue with therapy.     Plan: Return again in 1 weeks.  Diagnosis: Axis I: PTSD and Major depressive disorder, recurrent episode, moderate    Brittany [name] A, LCSW 07/19/2014

## 2014-07-20 ENCOUNTER — Other Ambulatory Visit (HOSPITAL_COMMUNITY): Payer: 59

## 2014-07-21 ENCOUNTER — Other Ambulatory Visit (HOSPITAL_COMMUNITY): Payer: 59

## 2014-07-24 ENCOUNTER — Other Ambulatory Visit (HOSPITAL_COMMUNITY): Payer: 59

## 2014-07-25 ENCOUNTER — Other Ambulatory Visit (HOSPITAL_COMMUNITY): Payer: 59

## 2014-07-26 ENCOUNTER — Encounter (HOSPITAL_COMMUNITY): Payer: Self-pay | Admitting: Clinical

## 2014-07-26 ENCOUNTER — Ambulatory Visit (INDEPENDENT_AMBULATORY_CARE_PROVIDER_SITE_OTHER): Payer: 59 | Admitting: Clinical

## 2014-07-26 ENCOUNTER — Other Ambulatory Visit (HOSPITAL_COMMUNITY): Payer: 59

## 2014-07-26 DIAGNOSIS — F331 Major depressive disorder, recurrent, moderate: Secondary | ICD-10-CM | POA: Diagnosis not present

## 2014-07-26 DIAGNOSIS — F4310 Post-traumatic stress disorder, unspecified: Secondary | ICD-10-CM | POA: Diagnosis not present

## 2014-07-26 NOTE — Progress Notes (Signed)
   THERAPIST PROGRESS NOTE  Session Time: 7:05 -8:05  Participation Level: Active  Behavioral Response: Neat and Well GroomedAlertDepressed  Type of Therapy: Individual Therapy  Treatment Goals addressed: improve psychiatric symptoms, Emotional Regulation Skills, Healthy Coping Skills, Improve Thoughts and Beliefs   Interventions: CBT and Motivational Interviewing, Grounding and Mindfulness Techniques  Summary: Brittany [name] is a 49 y.o female who presents with   PTSD and Major depressive disorder, recurrent episode, moderate  Suicidal/Homicidal: No -without intent/plan  Therapist Response:  [name] met with clinician for an individual session. [name] discussed her psychiatric symptoms, her current life events, and her homework. [name] shared that her friend died and that she attended his funeral. She shared about wanting to be there for her friend (the wife of the man who died. She discussed some of her catastrophic thoughts.She shared about the cbt and grounding tools she used to move through her emotions and the events. [name] shared her thoughts and insights from the experience. [name] and clinician reviewed and discussed her homework. [name] shared how it deepened her understanding of things discussed in therapy. [name] shared that the combination had helped her move through an event that she might not have been able to move through in the past. [name] agreed to continue her homework until next session.   Plan: Return again in 1 weeks.  Diagnosis: Axis I: PTSD and Major depressive disorder, recurrent episode, moderate    Jazzman Loughmiller A, LCSW 07/26/2014

## 2014-07-27 ENCOUNTER — Other Ambulatory Visit (HOSPITAL_COMMUNITY): Payer: 59

## 2014-07-28 ENCOUNTER — Other Ambulatory Visit (HOSPITAL_COMMUNITY): Payer: 59

## 2014-08-01 ENCOUNTER — Other Ambulatory Visit (HOSPITAL_COMMUNITY): Payer: Self-pay | Admitting: Psychiatry

## 2014-08-01 NOTE — Telephone Encounter (Signed)
Chart reviewed. Refill not appropriate. Was given 90 day supply in March. Requested refill too soon.

## 2014-08-02 ENCOUNTER — Ambulatory Visit (INDEPENDENT_AMBULATORY_CARE_PROVIDER_SITE_OTHER): Payer: 59 | Admitting: Clinical

## 2014-08-02 DIAGNOSIS — F331 Major depressive disorder, recurrent, moderate: Secondary | ICD-10-CM | POA: Diagnosis not present

## 2014-08-02 DIAGNOSIS — F4310 Post-traumatic stress disorder, unspecified: Secondary | ICD-10-CM | POA: Diagnosis not present

## 2014-08-03 ENCOUNTER — Encounter (HOSPITAL_COMMUNITY): Payer: Self-pay | Admitting: Clinical
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
## 2014-08-09 ENCOUNTER — Ambulatory Visit (INDEPENDENT_AMBULATORY_CARE_PROVIDER_SITE_OTHER): Payer: 59 | Admitting: Clinical

## 2014-08-09 ENCOUNTER — Encounter (HOSPITAL_COMMUNITY): Payer: Self-pay | Admitting: Clinical

## 2014-08-09 DIAGNOSIS — F4310 Post-traumatic stress disorder, unspecified: Secondary | ICD-10-CM

## 2014-08-09 DIAGNOSIS — F331 Major depressive disorder, recurrent, moderate: Secondary | ICD-10-CM | POA: Diagnosis not present

## 2014-08-09 NOTE — Progress Notes (Signed)
   THERAPIST PROGRESS NOTE  Session Time:  7:02 -8:02  Participation Level: Active  Behavioral Response: Neat and Well GroomedAlertDepressed  Type of Therapy: Individual Therapy  Treatment Goals addressed: improve psychiatric symptoms, Emotional Regulation Skills, Healthy Coping Skills, Improve Thoughts and Beliefs   Interventions: CBT and Motivational Interviewing, Grounding and Mindfulness Techniques  Summary: Brittany [name] is a 49 y.o female who presents with   PTSD and Major depressive disorder, recurrent episode, moderate  Suicidal/Homicidal: No -without intent/plan  Therapist Response:  Brittany [name] met with clinician for an individual session. Brittany [name] discussed her psychiatric symptoms, her current life events, and her homework. Brittany [name] shared that she partially completed her homework. Client and clinician reviewed and discussed her homework. Brittany [name] shared that she had been doing well until Sunday. She shared that she was having passive suicidal thoughts which she shared with her husband and plans to share with her psychiatrist. Client and clinician discussed her safety plan. Brittany [name] shared that Monday and Tuesday were especially challenging due to depression. Client and clinician discussed her negative thoughts and beliefs. Client and clinician discussed the evidence that did and did not support those beliefs. Brittany [name] was then able to formulate healthier alternative beliefs. Brittany [name] and clinician discussed the process of changing thoughts and beliefs. Brittany [name] and clinician discussed catastrophising  And black and white thing. Brittany [name] shared her insight that she had taken healthier steps this past depressive spell than she would have a year ago. Client and clinician discussed the importance of recognizing growth and strengths. Brittany [name] agreed to continue her homework until next session. Brittany [name] agreed to reach out for help if the suicidal thoughts returned.  Plan: Return again in 2 weeks.  ( [name] is unable to come next week,  though she knows what she can do if she needs help prior to next session)  Diagnosis: Axis I: PTSD and Major depressive disorder, recurrent episode, moderate    Tamon Parkerson A, LCSW 08/09/2014

## 2014-08-14 ENCOUNTER — Ambulatory Visit (INDEPENDENT_AMBULATORY_CARE_PROVIDER_SITE_OTHER): Payer: 59 | Admitting: Psychiatry

## 2014-08-14 ENCOUNTER — Encounter (HOSPITAL_COMMUNITY): Payer: Self-pay | Admitting: Psychiatry

## 2014-08-14 VITALS — BP 115/79 | HR 68 | Ht 70.0 in | Wt 199.8 lb

## 2014-08-14 DIAGNOSIS — F4310 Post-traumatic stress disorder, unspecified: Secondary | ICD-10-CM

## 2014-08-14 DIAGNOSIS — F331 Major depressive disorder, recurrent, moderate: Secondary | ICD-10-CM | POA: Diagnosis not present

## 2014-08-14 MED ORDER — LAMOTRIGINE 25 MG PO TABS: ORAL_TABLET | ORAL | Status: DC

## 2014-08-14 MED ORDER — QUETIAPINE FUMARATE 100 MG PO TABS: 100.0000 mg | ORAL_TABLET | Freq: Every day | ORAL | Status: DC

## 2014-08-14 MED ORDER — DULOXETINE HCL 60 MG PO CPEP: 60.0000 mg | ORAL_CAPSULE | Freq: Two times a day (BID) | ORAL | Status: DC
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
## 2014-08-15 ENCOUNTER — Telehealth (HOSPITAL_COMMUNITY): Payer: Self-pay

## 2014-08-15 NOTE — Telephone Encounter (Signed)
I returned patient's phone call.  She developed a rash with the Lamictal.  I recommended to stop the Lamictal and do not take it again.  I also suggested to give few more days until rash completely resolved.  I also suggested to go to emergency room if her rash does not get better.  Patient acknowledges and she will call us back next week for the update.

## 2014-08-16 ENCOUNTER — Ambulatory Visit (HOSPITAL_COMMUNITY): Payer: Self-pay | Admitting: Clinical

## 2014-08-22 ENCOUNTER — Telehealth (HOSPITAL_COMMUNITY): Payer: Self-pay

## 2014-08-22 NOTE — Telephone Encounter (Signed)
Met with Dr. Adele Schilder and then called patient to inform Dr. Adele Schilder wanted patient to not start anything else at this time, to see how she does without adding back another medication since patient states she is doing okay and returns to see him on 08/31/14. Patient agreed with plan and will call if symptoms of depression return or other problems occur prior to upcoming appointment.

## 2014-08-22 NOTE — Telephone Encounter (Signed)
Medication management - Telephone call with patient after she left a message stating she experienced no more problems with rash once she stopped her Lamictal a week prior.  Patient stated she is doing okay now and was not sure if you wanted to start her back on something else as she has been a week since stopping the Lamictal.

## 2014-08-23 ENCOUNTER — Encounter (HOSPITAL_COMMUNITY): Payer: Self-pay | Admitting: Clinical

## 2014-08-23 ENCOUNTER — Ambulatory Visit (INDEPENDENT_AMBULATORY_CARE_PROVIDER_SITE_OTHER): Payer: 59 | Admitting: Clinical

## 2014-08-23 DIAGNOSIS — F331 Major depressive disorder, recurrent, moderate: Secondary | ICD-10-CM | POA: Diagnosis not present

## 2014-08-23 DIAGNOSIS — F4310 Post-traumatic stress disorder, unspecified: Secondary | ICD-10-CM | POA: Diagnosis not present

## 2014-08-23 NOTE — Progress Notes (Signed)
   THERAPIST PROGRESS NOTE  Session Time: 7:00 -7:58  Participation Level: Active  Behavioral Response: CasualAlertAnxious and Depressed  Type of Therapy: Individual Therapy  Treatment Goals addressed: improve psychiatric symptoms, Emotional Regulation Skills, Healthy Coping Skills, Improve Thoughts and Beliefs  Interventions: CBT and Motivational Interviewing, Grounding and Mindfulness Techniques  Summary: Dimitri MIKITA LESMEISTER is a 49 y.o female who presents with   PTSD and Major depressive disorder, recurrent episode, moderate  Suicidal/Homicidal: No -without intent/plan  Therapist Response:  Lamiah met with clinician for an individual session. Emmilyn discussed her psychiatric symptoms, her current life events, and her homework.  Lamyiah shared that she had a few rough weeks. She shared that she has been experiencing grief from the loss of her friend. This grief mixed with other feelings of sadness and concern about other people's health and Timberlee experienced deep depression. Salah share that she had experienced some suicidal thoughts, she shared that she had a plan but opted to sleep instead. When asked why she didn't go through with it Anaya shared that it would hurt to many people and she would miss out on things such as watching her grandchildren grow up. Taelar denied any current suicidal or homicidal ideation. Jerrianne and clinician reviewed her safety plan actions.  Genae and clinician discussed her grandchildren and the pleasure and life lessons they bring. Shulamit and clinician discussed some of the lessons and how they apply to her mental health and personal growth.  Donalee shared she was going to help her friend clean out her house to put it on sale. Jonai shared about how this friend is doing since her husband died. Client and clinician discussed her upcoming visit to help. Client and clinician discussed cbt  And mindfulness skills to help her keep the whole picture in perspective. Debi had the insight that it  sometimes feels like the bad moments last forever but that they pass. Arla shared that she was considering IOP again but work Social research officer, government. Won't allow for it. Client and clinician discussed what she was getting out of IOP that she is now lacking. Cherron shared the consistency. Tennie and clinician discussed what Carrieanne could do daily that would help her consistently to improve her mental health. Glenys laid out a plan that she felt she could stick with and committed to try it daily for 30 days.  Kana agreed to do this as homework until next session   Plan: Return again in 1 weeks.  Diagnosis: Axis I: PTSD and Major depressive disorder, recurrent episode, moderate   Azyiah Bo A, LCSW 08/23/2014

## 2014-08-25 ENCOUNTER — Encounter (HOSPITAL_COMMUNITY): Payer: Self-pay | Admitting: *Deleted

## 2014-08-25 NOTE — Telephone Encounter (Signed)
ERROR

## 2014-08-31 ENCOUNTER — Ambulatory Visit (INDEPENDENT_AMBULATORY_CARE_PROVIDER_SITE_OTHER): Payer: 59 | Admitting: Psychiatry

## 2014-08-31 ENCOUNTER — Encounter (HOSPITAL_COMMUNITY): Payer: Self-pay | Admitting: Psychiatry

## 2014-08-31 VITALS — BP 110/70 | HR 66 | Ht 70.0 in | Wt 200.6 lb

## 2014-08-31 DIAGNOSIS — F339 Major depressive disorder, recurrent, unspecified: Secondary | ICD-10-CM

## 2014-08-31 DIAGNOSIS — F331 Major depressive disorder, recurrent, moderate: Secondary | ICD-10-CM

## 2014-08-31 DIAGNOSIS — F4310 Post-traumatic stress disorder, unspecified: Secondary | ICD-10-CM | POA: Diagnosis not present

## 2014-08-31 MED ORDER — LITHIUM CARBONATE 300 MG PO TABS
300.0000 mg | ORAL_TABLET | Freq: Every day | ORAL | Status: DC
Start: 2014-08-31 — End: 2014-09-19
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
## 2014-09-05 ENCOUNTER — Ambulatory Visit (HOSPITAL_COMMUNITY): Payer: Self-pay | Admitting: Psychiatry

## 2014-09-09 ENCOUNTER — Other Ambulatory Visit (HOSPITAL_COMMUNITY): Payer: Self-pay | Admitting: Psychiatry

## 2014-09-09 DIAGNOSIS — F331 Major depressive disorder, recurrent, moderate: Secondary | ICD-10-CM

## 2014-09-12 NOTE — Telephone Encounter (Signed)
Patient seen 08-31-14 Patient scheduled for follow up 09-14-14.  Medication last sent 08-14-14.  Refill due 09-14-14.

## 2014-09-13 ENCOUNTER — Ambulatory Visit (HOSPITAL_COMMUNITY): Payer: Self-pay | Admitting: Clinical

## 2014-09-14 ENCOUNTER — Ambulatory Visit (INDEPENDENT_AMBULATORY_CARE_PROVIDER_SITE_OTHER): Payer: 59 | Admitting: Clinical

## 2014-09-14 ENCOUNTER — Encounter (HOSPITAL_COMMUNITY): Payer: Self-pay | Admitting: Clinical

## 2014-09-14 DIAGNOSIS — F331 Major depressive disorder, recurrent, moderate: Secondary | ICD-10-CM | POA: Diagnosis not present

## 2014-09-14 DIAGNOSIS — F4310 Post-traumatic stress disorder, unspecified: Secondary | ICD-10-CM | POA: Diagnosis not present

## 2014-09-14 NOTE — Progress Notes (Signed)
   THERAPIST PROGRESS NOTE  Session Time: 2:13 -3:25  Participation Level: Active  Behavioral Response: CasualAlertNA  Type of Therapy: Individual Therapy  Treatment Goals addressed: improve psychiatric symptoms, Emotional Regulation Skills, Healthy Coping Skills, Improve Thoughts and Beliefs   Interventions: CBT and Motivational Interviewing,  Summary: Brittany [name] is a 49 y.o female who presents with   PTSD and Major depressive disorder, recurrent episode, moderate  Suicidal/Homicidal: No -without intent/plan  Therapist Response:  Brittany [name] met with clinician for an individual session. Brittany [name] discussed her psychiatric symptoms and her current life events. Brittany [name] shared that she had been having some difficulty with her mood. She shared that she had been experiencing a lot of depression lately. Brittany [name] discussed the healthy coping techniques she had been using. All of which were really good, such as, having her granddaughter stay over when her husband was out of town so that she would have someone else to focus on, making her self exercise, using grounding techniques, helping others.  Brittany [name] and clinician discussed her thoughts about her coping skills. Brittany [name] was able to identify ways that she has improved. Brittany [name] also shared her mood journal and shared about what the differences were in the days.Client and clinician discussed how Brittany [name]'s actions did or did not affect her mood.  Brittany [name] identified some patterns that she had not previously recognized. Brittany [name] and clinician discussed how she could use this information in the future to improve her mood. Brittany [name] and clinician discussed her thoughts and emotions about her depression. Brittany [name] shared some of her negative thoughts about herself and her struggle. Client and clinician discussed whether or not the evidence did or did not support her thoughts. Brittany [name] was able to formulate healthier alternative beliefs that she said were "more true and feel better." Brittany [name] agreed to continue  with her homework - journal, grounding and mindfulness techniques, and practicing her cbt skills  Plan: Return again in 1 weeks.  Diagnosis: Axis I: PTSD and Major depressive disorder, recurrent episode, moderate    Charletta Voight A, LCSW 09/14/2014

## 2014-09-19 ENCOUNTER — Ambulatory Visit (HOSPITAL_COMMUNITY): Payer: Self-pay | Admitting: Clinical

## 2014-09-19 ENCOUNTER — Ambulatory Visit (INDEPENDENT_AMBULATORY_CARE_PROVIDER_SITE_OTHER): Payer: 59 | Admitting: Psychiatry

## 2014-09-19 ENCOUNTER — Encounter (HOSPITAL_COMMUNITY): Payer: Self-pay | Admitting: Psychiatry

## 2014-09-19 VITALS — BP 112/69 | HR 65 | Ht 70.0 in | Wt 202.8 lb

## 2014-09-19 DIAGNOSIS — F4310 Post-traumatic stress disorder, unspecified: Secondary | ICD-10-CM

## 2014-09-19 DIAGNOSIS — F331 Major depressive disorder, recurrent, moderate: Secondary | ICD-10-CM

## 2014-09-19 DIAGNOSIS — F339 Major depressive disorder, recurrent, unspecified: Secondary | ICD-10-CM | POA: Diagnosis not present

## 2014-09-19 MED ORDER — LITHIUM CARBONATE ER 450 MG PO TBCR: 450.0000 mg | EXTENDED_RELEASE_TABLET | Freq: Every day | ORAL | Status: DC

## 2014-09-19 MED ORDER — DULOXETINE HCL 60 MG PO CPEP: ORAL_CAPSULE | ORAL | Status: DC
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
## 2014-09-27 ENCOUNTER — Ambulatory Visit (INDEPENDENT_AMBULATORY_CARE_PROVIDER_SITE_OTHER): Payer: 59 | Admitting: Clinical

## 2014-09-27 ENCOUNTER — Encounter (HOSPITAL_COMMUNITY): Payer: Self-pay | Admitting: Clinical

## 2014-09-27 DIAGNOSIS — F4310 Post-traumatic stress disorder, unspecified: Secondary | ICD-10-CM

## 2014-09-27 DIAGNOSIS — F331 Major depressive disorder, recurrent, moderate: Secondary | ICD-10-CM | POA: Diagnosis not present

## 2014-09-27 NOTE — Progress Notes (Addendum)
   THERAPIST PROGRESS NOTE  Session Time: 7:02 - 8:00  Participation Level: Active   Behavioral Response: CasualAlertAnxious  Type of Therapy: Individual Therapy  Treatment Goals addressed: improve psychiatric symptoms, Emotional Regulation Skills, Healthy Coping Skills, Improve Thoughts and Beliefs   Interventions: CBT and Motivational Interviewing  Summary: Lurlean ULONDA KLOSOWSKI is a 49 y.o female who presents with   PTSD and Major depressive disorder, recurrent episode, moderate  Suicidal/Homicidal: No -without intent/plan  Therapist Response:  Chennel met with clinician for an individual session. Jimmye discussed her psychiatric symptoms, her current life events, and her homework. Reya and clinician discussed and reviewed her homework. Delicia shared her thoughts and insights. She shared how she was trying to apply the skills from homework and sessions. Brytney shared that she had a rough week at work but had been putting her skills to work "which didn't fix things but made them more bearable. Jaydn shared an example of using her healthy coping skills. She shared that she spoke assertively about a problem that was concerning her. She used her emotional regulation skills to keep herself calm. She shared that in the past she would have let her emotions build up until she"exploded" she shared that she was really proud about addressing the situation in a healthy way. Client and clinician discussed her process, her skills, and what she learned from the experience. Client and clinician discussed other areas she could apply this insight. Velmer shared she was starting to understand that if she addressed things early before becoming overly emotionally charged they were more manageable. Kayana and clinician discussed her changing perspective. Roanna also shared about saying "yes" as an opposite action to wanting to isolate. Audrea shared that this has been helpful in improving her mood. Client and clinician discussed the process  of challenging old beliefs and behaviors. Client and clinician reviewed some basic cbt concepts. Tniya shared that it was helpful to know how to change her thoughts.  Plan: Return again in 1 weeks.  Diagnosis: Axis I: PTSD and Major depressive disorder, recurrent episode, moderate   Greogory Cornette A, LCSW 09/27/2014

## 2014-10-04 ENCOUNTER — Ambulatory Visit (HOSPITAL_COMMUNITY): Payer: Self-pay | Admitting: Clinical

## 2014-10-05 ENCOUNTER — Inpatient Hospital Stay (HOSPITAL_COMMUNITY)
Admission: AD | Admit: 2014-10-05 | Discharge: 2014-10-13 | DRG: 885 | Disposition: A | Discharge status: Home / Self Care | Payer: 59 | Attending: Psychiatry | Admitting: Psychiatry

## 2014-10-05 ENCOUNTER — Encounter (HOSPITAL_COMMUNITY): Payer: Self-pay | Admitting: *Deleted

## 2014-10-05 DIAGNOSIS — Z9884 Bariatric surgery status: Secondary | ICD-10-CM

## 2014-10-05 DIAGNOSIS — R45851 Suicidal ideations: Secondary | ICD-10-CM

## 2014-10-05 DIAGNOSIS — Z87891 Personal history of nicotine dependence: Secondary | ICD-10-CM

## 2014-10-05 DIAGNOSIS — F332 Major depressive disorder, recurrent severe without psychotic features: Principal | ICD-10-CM | POA: Diagnosis present

## 2014-10-05 MED ORDER — ACETAMINOPHEN 325 MG PO TABS
650.0000 mg | ORAL_TABLET | Freq: Four times a day (QID) | ORAL | Status: DC | PRN
  Administered 2014-10-07 – 2014-10-12 (×4): 650 mg via ORAL
  Filled 2014-10-05 (×4): qty 2

## 2014-10-05 MED ORDER — MAGNESIUM HYDROXIDE 400 MG/5ML PO SUSP: 30.0000 mL | Freq: Every day | ORAL | Status: DC | PRN

## 2014-10-05 MED ORDER — HYDROXYZINE HCL 25 MG PO TABS: 25.0000 mg | ORAL_TABLET | Freq: Three times a day (TID) | ORAL | Status: DC | PRN

## 2014-10-05 MED ORDER — TRAZODONE HCL 50 MG PO TABS
50.0000 mg | ORAL_TABLET | Freq: Every evening | ORAL | Status: DC | PRN
  Administered 2014-10-05: 50 mg via ORAL
  Filled 2014-10-05: qty 1

## 2014-10-05 MED ORDER — ALUM & MAG HYDROXIDE-SIMETH 200-200-20 MG/5ML PO SUSP: 30.0000 mL | ORAL | Status: DC | PRN

## 2014-10-05 NOTE — Plan of Care (Signed)
[place] [facility] Crisis Plan  Reason for Crisis Plan:  Crisis Stabilization   Plan of Care:  Referral for Telepsychiatry/Psychiatric Consult  Family Support:      Current Living Environment:  Living Arrangements: Spouse/significant other  Insurance:   Hospital Account    Name Acct ID Class Status Primary Coverage   Brittany [name], Brittany [name] [id number] [place]        Guarantor Account (for Hospital Account [id number])    Name Relation to Pt Service Area Active? Acct Type   Brittany [name] Self [facility] Yes Behavioral Health   Address Phone       [address] [place], [place] [address] [phone](H) [phone])          Coverage Information (for Hospital Account [id number])    F/O Payor/Plan Precert #   [place] #   Brittany [name], Brittany [name] [id number]   Address [place]   [address] [place] [address] [phone]      Legal Guardian:     Primary Care Provider:  Guadlupe Spanish, MD  Current Outpatient Providers:  Dr Brittany [name]  Psychiatrist:  Name of Psychiatrist: Dr. Adele [name]   Counselor/Therapist:  Name of Therapist: Romeo [name]  Compliant with Medications:  Yes  Additional Information:   Brittany [name] 6/23/20162:38 PM
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
## 2014-10-05 NOTE — Progress Notes (Signed)
[facility] INPATIENT:  Family/Significant Other Suicide Prevention Education  Suicide Prevention Education:  Patient Refusal for Family/Significant Other Suicide Prevention Education: The patient Brittany [name] has refused to provide written consent for family/significant other to be provided Family/Significant Other Suicide Prevention Education during admission and/or prior to discharge.  Physician notified.  Debbrah Alar 10/05/2014, 2:37 PM
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
## 2014-10-06 ENCOUNTER — Telehealth (HOSPITAL_COMMUNITY): Payer: Self-pay

## 2014-10-06 DIAGNOSIS — R45851 Suicidal ideations: Secondary | ICD-10-CM | POA: Diagnosis present

## 2014-10-06 DIAGNOSIS — Z9884 Bariatric surgery status: Secondary | ICD-10-CM | POA: Diagnosis not present

## 2014-10-06 DIAGNOSIS — F332 Major depressive disorder, recurrent severe without psychotic features: Secondary | ICD-10-CM | POA: Insufficient documentation

## 2014-10-06 DIAGNOSIS — F329 Major depressive disorder, single episode, unspecified: Secondary | ICD-10-CM | POA: Diagnosis present

## 2014-10-06 DIAGNOSIS — Z87891 Personal history of nicotine dependence: Secondary | ICD-10-CM | POA: Diagnosis not present

## 2014-10-06 LAB — LITHIUM LEVEL: Lithium Lvl: 0.23 mmol/L — ABNORMAL LOW (ref 0.60–1.20)

## 2014-10-06 MED ORDER — LITHIUM CARBONATE ER 450 MG PO TBCR: 450.0000 mg | EXTENDED_RELEASE_TABLET | Freq: Every day | ORAL | Status: DC

## 2014-10-06 MED ORDER — LITHIUM CARBONATE ER 300 MG PO TBCR
600.0000 mg | EXTENDED_RELEASE_TABLET | Freq: Every day | ORAL | Status: DC
  Administered 2014-10-06 – 2014-10-07 (×2): 600 mg via ORAL
  Filled 2014-10-06 (×4): qty 2

## 2014-10-06 MED ORDER — QUETIAPINE FUMARATE 100 MG PO TABS
100.0000 mg | ORAL_TABLET | Freq: Every day | ORAL | Status: DC
  Administered 2014-10-06 – 2014-10-12 (×8): 100 mg via ORAL
  Filled 2014-10-06: qty 3
  Filled 2014-10-06 (×11): qty 1

## 2014-10-06 MED ORDER — CLONAZEPAM 0.5 MG PO TABS
1.0000 mg | ORAL_TABLET | Freq: Every day | ORAL | Status: DC
  Administered 2014-10-06 – 2014-10-12 (×8): 1 mg via ORAL
  Filled 2014-10-06 (×8): qty 2

## 2014-10-06 MED ORDER — CLONAZEPAM 1 MG PO TBDP: 1.0000 mg | ORAL_TABLET | Freq: Every day | ORAL | Status: DC

## 2014-10-06 MED ORDER — DULOXETINE HCL 60 MG PO CPEP
60.0000 mg | ORAL_CAPSULE | Freq: Two times a day (BID) | ORAL | Status: DC
  Administered 2014-10-06 – 2014-10-13 (×15): 60 mg via ORAL
  Filled 2014-10-06 (×8): qty 1
  Filled 2014-10-06: qty 6
  Filled 2014-10-06 (×8): qty 1
  Filled 2014-10-06: qty 6
  Filled 2014-10-06 (×5): qty 1

## 2014-10-06 MED ORDER — LORATADINE 10 MG PO TABS
10.0000 mg | ORAL_TABLET | Freq: Every day | ORAL | Status: DC
  Administered 2014-10-06 – 2014-10-13 (×8): 10 mg via ORAL
  Filled 2014-10-06 (×10): qty 1
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
## 2014-10-06 NOTE — Progress Notes (Signed)
Patient ID: Brittany [name], female   DOB: 1965-07-06, 49 y.o.   MRN: [id number] D: Client says of admission "feel safer, still upset, still struggling"  "I blew up at work, I had asked someone else to do something and after a few days it still hadn't been done and I asked what if that paperwork would have kept someone from a lot of legal trouble, it would have been all our fault" "I got mad and threw some papers and said some things, upset every body" "I went home took a bunch of Klonopin, drank some Tequila, smoked some cigarettes and (I don't even smoke), ate some donuts, started feeling unsafe so I came here" "I feel like I'm spiraling further in two mental illness" A: Writer provided emotional support encouraged client to come to staff if feelings of suicidal ideations turn into a plan. Staff will monitor q49min for safety. R: Client is safe on the unit, contracted for safety. Client attended group.

## 2014-10-06 NOTE — BHH Counselor (Signed)
Counselor spoke with patient regarding the recommendation for admission to the adult inpatient unit. Patient states that she has been seeing Dr. Adele Schilder and Francee Piccolo at [facility] and she has "ups and downs" and she has been on a "downward spiral." Patient states that she feels comfortable about admission to the adult unit. Counselor informed patient that she will be transitioned to the unit sometime later today and she will be notified of the transition. Patient contacted [facility] to leave a message for Dr. Adele Schilder about being admitted to the unit.
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
## 2014-10-06 NOTE — Progress Notes (Signed)
Pt is new to the unit and she trying to just get adjusted to the unit and the schedule.

## 2014-10-06 NOTE — BHH Counselor (Signed)
Brittany Pizza, NP recommends inpatient at this time.Counselor contacted Debarah Crape, RN, [facility] to request a bed at [facility] based on patients needs. Randall Hiss states that there is no bed available at this time but he is expecting discharges today and will call back with more information.

## 2014-10-06 NOTE — Progress Notes (Signed)
Patient ID: Brittany [name], female   DOB: 05/19/1965, 49 y.o.   MRN: [id number] Discharge Note-Seen by Heloise Purpura NP this am and he has consulted with Dr Dwyane Dee and decision was made to transfer to the adult unit due to feeling suicidal with plan to overdose. She states she doesn't believe the medications are working any longer and she is just tired of the fight. She has a job she likes and a good marriage but inspite of all of it she is depressed and anxious. Waiting on a bed to become available to transfer.Assessment office staff person came to get her to admit her to adult. All property returned to her.

## 2014-10-06 NOTE — BHH Counselor (Signed)
Counselor met with pt at her bedside to discuss her reason for admission. Pt explained that she has been experiencing increased feelings of anger, depression and thoughts of suicide over the past few weeks. Pt recalled a recent incident that occurred at work which resulted in her yelling at her supervisor. Pt shared that she is happily married and her children doing good, but she is unable to identify why she's unhappy, depressed and have mood swings. She reported that she isolates herself and this is her 3rd hospitalization within one year. She endorsed passive SI but contracted for safety. She denied Hallucinations. Pt reports that she receives outpatient therapy and medication management with [facility]. Pt was very tearful while talking with the counselor and the nurse, and states that she just wants to get better and be able to address her stressors and learn effective ways to cope with her feelings of anger and depression.Counselor feels as though pt could greatly benefit from receiving some inpatient services to assist her with learning and implementing coping strategies and skills designed to target her depression and episodes of unexplained anger. Counselor also suggested that follow up with her psychiatrist at [facility] once she is discharged. Pt was very receptive of the information presented to her.   Redmond Pulling, MA OBS Counselor

## 2014-10-06 NOTE — Progress Notes (Signed)
Patient in bed resting at the beginning of the shift. She appeared sad and tearful when she woke up. She reported that she  Had a rough day at work  and sent an mistakenly sent an offensive e-mail to a wrong person. She reported that she works as Set designer with same company for 7 yrs, has a Surveyor, quantity, makes decent money, happily married and children doing good, but she is unable to identify why she's unhappy, depressed and have mood swings.. She reported that she isolates herself and this is her 3rd hospitalization within one year. She endorsed passive SI but contracted for safety. She denied Hallucinations. Writer encouraged and supported patient. Q 15 minute check continues as ordered to maintain safety.

## 2014-10-06 NOTE — Progress Notes (Signed)
Patient ID: Brittany [name], female   DOB: [birth year], 49 y.o.   MRN: [id number] D-Interested in being admitted. She states this is her third time here. She receives her outpatient treatment here with Dr Adele Schilder and a therapist but inspite of this she is depressed and rageful. She is still suicidal with a plan to overdose and the means to do so. She is able to contract for safety while here. A-Support offered Monitored for safety medication as ordered. R-No complaints at this time. Waiting on doctor for disposition. Used phone this am to call home.

## 2014-10-06 NOTE — BHH Counselor (Signed)
Patient has been accepted to [facility] 404-2 Dr. Parke Poisson attending. Call [phone] for report.
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
## 2014-10-07 ENCOUNTER — Encounter (HOSPITAL_COMMUNITY): Payer: Self-pay | Admitting: Registered Nurse

## 2014-10-07 DIAGNOSIS — R45851 Suicidal ideations: Secondary | ICD-10-CM

## 2014-10-07 DIAGNOSIS — F332 Major depressive disorder, recurrent severe without psychotic features: Principal | ICD-10-CM

## 2014-10-07 MED ORDER — LITHIUM CARBONATE ER 450 MG PO TBCR
450.0000 mg | EXTENDED_RELEASE_TABLET | Freq: Two times a day (BID) | ORAL | Status: DC
  Administered 2014-10-08 – 2014-10-13 (×11): 450 mg via ORAL
  Filled 2014-10-07 (×5): qty 1
  Filled 2014-10-07: qty 6
  Filled 2014-10-07 (×2): qty 1
  Filled 2014-10-07: qty 6
  Filled 2014-10-07 (×9): qty 1

## 2014-10-07 NOTE — Progress Notes (Signed)
Patient ID: Brittany [name], female   DOB: January 07, 1966, 49 y.o.   MRN: [id number] Pt visible in the milieu. Minimal yet appropriate interaction.  Pt expressed thoughts of suicidal ideation. Pt stated she did not have a plan.  Contracts for safety. Pt stated that in the past she could always state she would not actually go through with those thoughts because of her faith but that it seems to be getting worse.  She stated she is no longer sure that her faith would be able to deter her actions. Pt reported her thoughts would that of overdose.  She has asked her spouse to secure guns in the home.  I spoke with patient about asking spouse to also secure medications which she felt would be a good idea.  Pt denied specific stressor.  Support and encouragement was provided. Pt was receptive. Fifteen minute checks in progress for patient safety. Pt was safe on unit.

## 2014-10-07 NOTE — BHH Suicide Risk Assessment (Signed)
[facility] Admission Suicide Risk Assessment   Nursing information obtained from:    Demographic factors:    Current Mental Status:    Loss Factors:    Historical Factors:    Risk Reduction Factors:    Total Time spent with patient: 1 hour Principal Problem: Suicidal ideation Diagnosis:   Patient Active Problem List   Diagnosis Date Noted  . MDD (major depressive disorder), recurrent severe, without psychosis [F33.2]   . Suicidal ideation [R45.851] 10/05/2014  . Depression [F32.9] 06/12/2014  . Rash [R21] 01/04/2014  . Normocytic anemia [D64.9] 01/04/2014  . DJD (degenerative joint disease) [M19.90] 01/04/2014  . Hypertension [I10] 01/04/2014  . Dyslipidemia [E78.5] 01/04/2014  . GERD (gastroesophageal reflux disease) [K21.9] 01/04/2014  . Obstructive sleep apnea [G47.33] 01/04/2014  . 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 Abdominal abscess [K65.1] 01/01/2014  . Wound infection after surgery [T81.4XXA] 01/01/2014  . PTSD (post-traumatic stress disorder) [F43.10] 11/23/2013  . Severe recurrent major depression without psychotic features [F33.2] 11/22/2013  . 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 Morbid obesity, Weight - 312, BMI - 45.2 [E66.01] 04/21/2012     Continued Clinical Symptoms:  Alcohol Use Disorder Identification Test Final Score (AUDIT): 2 The "Alcohol Use Disorders Identification Test", Guidelines for Use in Primary Care, Second Edition.  World Pharmacologist [facility]). Score between 0-7:  no or low risk or alcohol related problems. Score between 8-15:  moderate risk of alcohol related problems. Score between 16-19:  high risk of alcohol related problems. Score 20 or above:  warrants further diagnostic evaluation for alcohol dependence and treatment.   CLINICAL FACTORS:   Depression:   Anhedonia Hopelessness Dysthymia More than one psychiatric diagnosis Unstable or Poor Therapeutic Relationship Previous  Psychiatric Diagnoses and Treatments   Musculoskeletal: Strength & Muscle Tone: within normal limits Gait & Station: normal Patient leans: N/A  Psychiatric Specialty Exam: Physical Exam  Constitutional: She appears well-developed and well-nourished.  HENT:  Head: Normocephalic.    Review of Systems  Constitutional: Negative.   Skin: Negative for rash.  Neurological: Negative for headaches.  Psychiatric/Behavioral: Positive for depression. The patient is nervous/anxious.     Blood pressure 111/77, pulse 78, temperature 98 F (36.7 C), temperature source Oral, resp. rate 16, height 5\' 10"  (1.778 m), weight 91.627 kg (202 lb).Body mass index is 28.98 kg/(m^2).  General Appearance: Casual  Eye Contact::  Fair  Speech:  Normal Rate  Volume:  Normal  Mood:  Depressed and Dysphoric  Affect:  Congruent and Constricted  Thought Process:  Coherent  Orientation:  Full (Time, Place, and Person)  Thought Content:  Rumination  Suicidal Thoughts:  Yes.  without intent/plan  Homicidal Thoughts:  No  Memory:  Immediate;   Fair Recent;   Fair  Judgement:  Poor  Insight:  Shallow  Psychomotor Activity:  Normal  Concentration:  Fair  Recall:  [place]: Fair  Akathisia:  Negative  Handed:  Right  AIMS (if indicated):     Assets:  Desire for Improvement Social Support  Sleep:  Number of Hours: 6.75  Cognition: WNL  ADL's:  Intact     COGNITIVE FEATURES THAT CONTRIBUTE TO RISK:  Closed-mindedness and Polarized thinking    SUICIDE RISK:   Moderate:  Frequent suicidal ideation with limited intensity, and duration, some specificity in terms of plans, no associated intent, good self-control, limited dysphoria/symptomatology, some risk factors present, and identifiable protective factors, including available and accessible social support.  PLAN OF CARE: suicidal toughts. Depression. Need inpatient stabilizatin. Medication management and support groups.   resistent depression. Consider augmentation of medications  Medical Decision Making:  Review of Psycho-Social Stressors (1), Established Problem, Worsening (2), Review of Last Therapy Session (1) and Review of Medication Regimen & Side Effects (2)  I certify that inpatient services furnished can reasonably be expected to improve the patient's condition.   Jaeli Grubb 10/07/2014, 10:09 AM

## 2014-10-07 NOTE — BHH Group Notes (Signed)
[facility] Group Notes:  (Clinical Social Work)  10/07/2014     1:15-2:15PM  Summary of Progress/Problems:   The main focus of today's process group was to learn how to use a decisional balance exercise to move forward in the Stages of Change, which were described and discussed.  Motivational Interviewing and the whiteboard were utilized to help patients explore in depth the perceived benefits and costs of unhealthy coping techniques, as well as the  benefits and costs of replacing that with a healthy coping skills.   The patient expressed that her unhealthy coping involves isolation and talked about how severely her PTSD is affecting her ability to even go to a store.    Type of Therapy:  Group Therapy - Process   Participation Level:  Active  Participation Quality:  Attentive and Sharing  Affect:  Anxious, Blunted and Depressed  Cognitive:  Alert  Insight:  Developing/Improving  Engagement in Therapy:  Engaged  Modes of Intervention:  Education, Motivational Interviewing  Selmer Dominion, LCSW 10/07/2014, 4:49 PM
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
## 2014-10-07 NOTE — Progress Notes (Signed)
Adult Psychoeducational Group Note  Date:  10/07/2014 Time:  9:16 PM  Group Topic/Focus:  Wrap-Up Group:   The focus of this group is to help patients review their daily goal of treatment and discuss progress on daily workbooks.  Participation Level:  Active  Participation Quality:  Appropriate  Affect:  Appropriate  Cognitive:  Appropriate  Insight: Appropriate  Engagement in Group:  Engaged  Modes of Intervention:  Discussion  Additional Comments: The patient expressed that she attended group.The patient also said that group was about coping techniques.  Nash Shearer 10/07/2014, 9:16 PM

## 2014-10-07 NOTE — Plan of Care (Signed)
Problem: Diagnosis: Increased Risk For Suicide Attempt Goal: LTG-Patient Will Report Improved Mood and Deny Suicidal LTG (by discharge) Patient will report improved mood and deny suicidal ideation.  Outcome: Progressing Client has made a verbal agreement to notify staff if passive feelings of suicide progresses to an actual plan to hurt self.

## 2014-10-07 NOTE — Progress Notes (Signed)
D: Patient having passive SI but verbally agrees to contract for safety. The patient denies HI and auditory and visual hallucinations. The patient has a depressed mood and affect. The patient rates her depression and hopelessness both a 9 out of 10 (1 low/10 high) and her anxiety a 10 out of 10 (1 low/10 high). The patient states her goal for today is to "not kill herself." Patient is attending groups and interacting appropriately within the milieu.  A: Patient given emotional support from RN. Patient encouraged to come to staff with concerns and/or questions. Patient's medication routine continued. Patient's orders and plan of care reviewed. Dicussed self-inventory sheet responses and revalidated the patient's verbal contract for safety.  R: Patient remains safe. Will continue to monitor patient q15 minutes for safety.
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
## 2014-10-08 NOTE — BHH Group Notes (Signed)
[facility] Group Notes:  (Clinical Social Work)  10/08/2014  1:15-2:15PM  Summary of Progress/Problems:   The main focus of today's process group was to   1)  discuss the importance of adding supports  2)  define health supports versus unhealthy supports  3)  identify the patient's current unhealthy supports and plan how to handle them  4)  Identify the patient's current healthy supports and plan what to add.  An emphasis was placed on using counselor, doctor, therapy groups, 12-step groups, and problem-specific support groups to expand supports.    The patient expressed full comprehension of the concepts presented, and agreed that there is a need to add more supports.  The patient stated that she has significant supports in family and co-workers.  She shared with another patient who has been receiving some significant boundary setting from his father, that she had to stop enabling her daughter several years ago, describing it in detail.  He said this was very helpful to him to understand things from his father's perspective.  Type of Therapy:  Process Group with Motivational Interviewing  Participation Level:  Active  Participation Quality:  Attentive, Sharing and Supportive  Affect:  Anxious, Blunted and Depressed  Cognitive:  Alert, Appropriate and Oriented  Insight:  Engaged  Engagement in Therapy:  Engaged  Modes of Intervention:   Education, Support and Processing, Activity  Selmer Dominion, LCSW 10/08/2014

## 2014-10-08 NOTE — Progress Notes (Signed)
D: Patient denies HI/AVH.  Patient endorses passive SI but agrees to contract.  Patient affect and mood are depressed.  Patient did attend evening group. Patient visible on the milieu. No distress noted. A: Support and encouragement offered. Scheduled medications given to pt. Q 15 min checks continued for patient safety. R: Patient receptive. Patient remains safe on the unit.

## 2014-10-08 NOTE — Progress Notes (Signed)
Pt presents with depressed mood, affect congruent. She reports she continues to feel very depressed, and frustrated that her mood is ''this way.'' despite being involved in therapy and taking medications. She states '' I am just frustrated because I know I'm safe here but I still have those thoughts of hurting myself, and that's why I brought myself in, because I knew I wasn't doing well, I just feel like I've been on so many things and don't feel like I'm getting better. '' Allowed pt to ventilate, support and encouragement provided. She did report interest in ECT therapy stating '' I just want to feel better, I feel miserable. '' encouraged pt to discuss with treatment team and MD. She does continue to report passive SI but is able to contract for safety. She continues to endorse anxiety, but has been using coloring book to help alleiviate that as she states '' it numbs my mind. '' She rates her depresion at 9/10, hopeless at 9/10, and anxiety at 10/10- scale being 1 least and 10 the worst. she denies any other acute concerns at this time. Brittany [name] has been up and about on the unit, attending programming and compliant with medications. In no acute distress at this time. Will continue to monitor q 15 minutes for safety.

## 2014-10-08 NOTE — BHH Group Notes (Signed)
[facility] Group Notes:  (Nursing/MHT/Case Management/Adjunct)  Date:  10/08/2014  Time:  12:26 PM  Type of Therapy:  Psychoeducational Skills  Participation Level:  Active  Participation Quality:  Appropriate  Affect:  Depressed  Cognitive:  Appropriate  Insight:  Appropriate  Engagement in Group:  Engaged  Modes of Intervention:  Education and Exploration  Summary of Progress/Problems:  Leonia Reader 10/08/2014, 12:26 PM

## 2014-10-08 NOTE — Progress Notes (Signed)
[facility] MD Progress Note  10/08/2014 1:38 PM Brittany [name]  MRN:  [id number]   Subjective:  "I've had a lot of really low moments today.  A lot of crying and a lot of emotional stuff."  Objective:  Patient seen face to face, chart reviewed and discussed with staff.  Patient states that she feels that she is doing a little better even if she is still depressed and crying.  "Even thought I am depressed and crying; I'm still sad, but I'm not obsessing over suicidal thoughts like I was yesterday.  Yesterday I was looking around for things trying to find things and wondering how I could use it to kill myself.  I'm not doing that to day so I see that as some improvement." Patient states that she is tolerating her medication without adverse effect States that she is attending and participating in group sessions.     Principal Problem: Severe recurrent major depression without psychotic features Diagnosis:   Patient Active Problem List   Diagnosis Date Noted  . MDD (major depressive disorder), recurrent severe, without psychosis [F33.2]   . Suicidal ideation [R45.851] 10/05/2014  . Depression [F32.9] 06/12/2014  . Rash [R21] 01/04/2014  . Normocytic anemia [D64.9] 01/04/2014  . DJD (degenerative joint disease) [M19.90] 01/04/2014  . Hypertension [I10] 01/04/2014  . Dyslipidemia [E78.5] 01/04/2014  . GERD (gastroesophageal reflux disease) [K21.9] 01/04/2014  . Obstructive sleep apnea [G47.33] 01/04/2014  . 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 Abdominal abscess [K65.1] 01/01/2014  . Wound infection after surgery [T81.4XXA] 01/01/2014  . PTSD (post-traumatic stress disorder) [F43.10] 11/23/2013  . Severe recurrent major depression without psychotic features [F33.2] 11/22/2013  . 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 Morbid obesity, Weight - 312, BMI - 45.2 [E66.01] 04/21/2012   Total Time spent with patient: 30 minutes   Past Medical History:   Past Medical History  Diagnosis Date  . Migraine   . Hypertension   . Hypercholesterolemia   . Depression   . Morbid obesity   . Sleep apnea     no CPAP machine   . H/O hiatal hernia   . GERD (gastroesophageal reflux disease)     hx of   . Arthritis     hips, knees and hands   . Anxiety   . PTSD (post-traumatic stress disorder)     Past Surgical History  Procedure Laterality Date  . Foot surgery    . Breath tek h pylori  05/03/2012    Procedure: BREATH TEK H PYLORI;  Surgeon: [name], MD;  Location: [name];  Service: General;  Laterality: N/A;  . Right tube and ovary removed     . Abdominal hysterectomy  1997 & 2006  . Colonoscopy      hx of benign polyps   . 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 Upper gi endoscopy N/A 07/05/2012    Procedure: UPPER GI ENDOSCOPY;  Surgeon: [name], MD;  Location: [facility];  Service: General;  Laterality: N/A;  . Laparoscopic lysis of adhesions N/A 07/05/2012    Procedure: LAPAROSCOPIC LYSIS OF ADHESIONS;  Surgeon: [name], MD;  Location: [facility];  Service: General;  Laterality: N/A;  repair of abdominal wall hernia  . Irrigation and debridement abscess N/A 01/01/2014    Procedure: IRRIGATION AND DEBRIDEMENT ABSCESS;  Surgeon:  [name], MD;  Location: [facility];  Service: General;  Laterality: N/A;  . Gastric bypass     Family History:  Family History  Problem Relation Age of Onset  . Cancer Mother     colon  . Cancer Maternal Aunt     breast  . Depression Father   . Depression Sister   . Post-traumatic stress disorder Sister   . Alcohol abuse Paternal Uncle   . Bipolar disorder Cousin   . Anxiety disorder Sister    Social History:  History  Alcohol Use No    Comment: Occasional use     History  Drug Use No    History   Social History  . Marital Status: Married     Spouse Name: N/A  . Number of Children: N/A  . Years of Education: N/A   Social History Main Topics  . Smoking status: Former Smoker    Types: Cigars    Quit date: 04/27/2004  . Smokeless tobacco: Never Used  . Alcohol Use: No     Comment: Occasional use  . Drug Use: No  . Sexual Activity: Yes   Other Topics Concern  . None   Social History Narrative   Additional History:    Sleep: Fair, slept better last night  Appetite:  Good   Assessment:   Musculoskeletal: Strength & Muscle Tone: within normal limits Gait & Station: normal Patient leans: N/A   Psychiatric Specialty Exam: Physical Exam  Nursing note and vitals reviewed. Constitutional: She is oriented to person, place, and time.  Neck: Normal range of motion.  Respiratory: Effort normal.  Musculoskeletal: Normal range of motion.  Neurological: She is alert and oriented to person, place, and time.  Psychiatric: Her mood appears anxious. She is withdrawn. Thought content is paranoid. Thought content is not delusional. She exhibits a depressed mood. She expresses suicidal ideation. She expresses no homicidal ideation. She expresses no suicidal plans.    Review of Systems  Musculoskeletal: Positive for joint pain.  Psychiatric/Behavioral: Positive for depression and suicidal ideas. Negative for hallucinations. The patient is nervous/anxious and has insomnia.     Blood pressure 125/73, pulse 87, temperature 98 F (36.7 C), temperature source Oral, resp. rate 16, height 5\' 10"  (1.778 m), weight 91.627 kg (202 lb).Body mass index is 28.98 kg/(m^2).  General Appearance: Casual  Eye Contact::  Good  Speech:  Clear and Coherent and Normal Rate  Volume:  Normal  Mood:  Anxious, Depressed and Hopeless  Affect:  Depressed, Flat and Tearful  Thought Process:  Circumstantial and Goal Directed  Orientation:  Full (Time, Place, and Person)  Thought Content:  Denies hallucinations, delusions, and paranoia  Suicidal  Thoughts:  Yes.  with intent/plan  Homicidal Thoughts:  No  Memory:  Immediate;   Good Recent;   Good Remote;   Good  Judgement:  Impaired  Insight:  Lacking  Psychomotor Activity:  Decreased  Concentration:  Fair  Recall:  Good  Fund of Knowledge:Good  Language: Good  Akathisia:  Negative  Handed:  Right  AIMS (if indicated):     Assets:  Communication Skills Desire for Improvement Housing Intimacy Physical Health Social Support  ADL's:  Intact  Cognition: WNL  Sleep:  Number of Hours: 6.75     Current Medications: Current Facility-Administered Medications  Medication Dose Route Frequency Provider Last Rate Last Dose  . acetaminophen (TYLENOL) tablet 650 mg  650 mg Oral Q6H PRN Kerrie Buffalo, NP   650 mg at 10/08/14 0907  .  alum & mag hydroxide-simeth (MAALOX/MYLANTA) 200-200-20 MG/5ML suspension 30 mL  30 mL Oral Q4H PRN Kerrie Buffalo, NP      . clonazePAM Bobbye [place]) tablet 1 mg  1 mg Oral QHS Hampton Abbot, MD   1 mg at 10/07/14 2131  . DULoxetine (CYMBALTA) DR capsule 60 mg  60 mg Oral BID Harriet Butte, NP   60 mg at 10/08/14 0751  . hydrOXYzine (ATARAX/VISTARIL) tablet 25 mg  25 mg Oral TID PRN Kerrie Buffalo, NP      . lithium carbonate (ESKALITH) CR tablet 450 mg  450 mg Oral BID WC Liley Rake B Davita Sublett, NP   450 mg at 10/08/14 0751  . loratadine (CLARITIN) tablet 10 mg  10 mg Oral Daily Harriet Butte, NP   10 mg at 10/08/14 0752  . magnesium hydroxide (MILK OF MAGNESIA) suspension 30 mL  30 mL Oral Daily PRN Kerrie Buffalo, NP      . QUEtiapine (SEROQUEL) tablet 100 mg  100 mg Oral QHS Harriet Butte, NP   100 mg at 10/07/14 2131  . traZODone (DESYREL) tablet 50 mg  50 mg Oral QHS PRN Kerrie Buffalo, NP   50 mg at 10/05/14 2237    Lab Results: No results found for this or any previous visit (from the past 48 hour(s)).  Physical Findings: AIMS: Facial and Oral Movements Muscles of Facial Expression: None, normal Lips and Perioral Area: None, normal Jaw: None,  normal Tongue: None, normal,Extremity Movements Upper (arms, wrists, hands, fingers): None, normal Lower (legs, knees, ankles, toes): None, normal, Trunk Movements Neck, shoulders, hips: None, normal, Overall Severity Severity of abnormal movements (highest score from questions above): None, normal Incapacitation due to abnormal movements: None, normal Patient's awareness of abnormal movements (rate only patient's report): No Awareness, Dental Status Current problems with teeth and/or dentures?: No Does patient usually wear dentures?: No  CIWA:    COWS:     Treatment Plan Summary: Daily contact with patient to assess and evaluate symptoms and progress in treatment and Medication management  1. Admit for crisis management and stabilization 2. Medication management to reduce current symptoms to base line and improve the patient's overall level of functioning:  Continue Lithium to 450 mg twice daily with meals for mood control, Continued Seroquel 100 mg Q hs for sleep, and Cymbalta DR 60 mg Bid fir depression, Klonopin 1 mg Q hs Prn anxiety/sleep, and Trazodone 50 mg Q hs prn sleep.  3. Treat health problems as indicated 4. Develop treatment plan to decrease risk of relapse upon discharge and the need for readmission. 5. Psycho-social education regarding relapse prevention and self care. 6. Health care follow up as needed for medical problems 7. Restart home medications where appropriate.  Will Continue with current treatment plan.  No changes at this time  Medical Decision Making:  Established Problem, Stable/Improving (1), Review of Psycho-Social Stressors (1) and Review of Medication Regimen & Side Effects (2)   Norah Fick, FNP-BC 10/08/2014, 1:38 PM

## 2014-10-09 MED ORDER — HYDROXYZINE HCL 25 MG PO TABS
25.0000 mg | ORAL_TABLET | Freq: Three times a day (TID) | ORAL | Status: DC
  Administered 2014-10-09 – 2014-10-13 (×13): 25 mg via ORAL
  Filled 2014-10-09 (×6): qty 1
  Filled 2014-10-09: qty 9
  Filled 2014-10-09 (×4): qty 1
  Filled 2014-10-09: qty 9
  Filled 2014-10-09: qty 1
  Filled 2014-10-09: qty 9
  Filled 2014-10-09 (×8): qty 1

## 2014-10-09 NOTE — Plan of Care (Signed)
Problem: Alteration in mood Goal: STG-Patient is able to discuss feelings and issues (Patient is able to discuss feelings and issues leading to depression)  Outcome: Progressing Pt reports this is the first day she has not has the urge to hurt self.

## 2014-10-09 NOTE — BHH Suicide Risk Assessment (Signed)
[facility] INPATIENT:  Family/Significant Other Suicide Prevention Education  Suicide Prevention Education:  Education Completed; [name] Pittman, Husband, [phone]; has been identified by the patient as the family member/significant other with whom the patient will be residing, and identified as the person(s) who will aid the patient in the event of a mental health crisis (suicidal ideations/suicide attempt).  With written consent from the patient, the family member/significant other has been provided the following suicide prevention education, prior to the and/or following the discharge of the patient.  The suicide prevention education provided includes the following:  Suicide risk factors  Suicide prevention and interventions  National Suicide Hotline telephone number  [facility] assessment telephone number  [facility] Emergency Assistance [place] and/or Residential Mobile Crisis Unit telephone number  Request made of family/significant other to:  Remove weapons (e.g., guns, rifles, knives), all items previously/currently identified as safety concern.   Husband advised patient does not have access to weapons.   He reports all guns and ammunitions are locked up and in different safes.    Remove drugs/medications (over-the-counter, prescriptions, illicit drugs), all items previously/currently identified as a safety concern.  The family member/significant other verbalizes understanding of the suicide prevention education information provided.  The family member/significant other agrees to remove the items of safety concern listed above.  Concha Pyo 10/09/2014, 1:17 PM

## 2014-10-09 NOTE — BHH Group Notes (Signed)
[facility] LCSW Group Therapy          Overcoming Obstacles       1:15 -2:30        10/09/2014       Type of Therapy:  Group Therapy  Participation Level:  Appropriate  Participation Quality:  Appropriate  Affect:  Appropriate  Cognitive:   Appropriate  Insight: Developing/Improving Engaged  Engagement in Therapy: Developing/Imprvoing Engaged  Modes of Intervention:  Discussion Exploration  Education Rapport BuildingProblem-Solving Support  Summary of Progress/Problems:  The main focus of today's group was overcoming obstacles. She shared the obstacle she has to overcome is relapsing in depression. She stated she takes medications as prescribed and goes to therapy as scheduled.  Patient shared she has a wonderful husband and a great support system but tends to fall back into depression. She is hoping this hospital will have a better outcome.   Brittany [name] 10/09/2014

## 2014-10-09 NOTE — Progress Notes (Signed)
Patient ID: Brittany [name], female   DOB: 1965/04/24, 49 y.o.   MRN: [id number]   Pt currently presents with a flat affect and anxious behavior. Per self inventory, pt rates depression at a 7, hopelessness 5 and anxiety 10. Pt's daily goal is "my anxiety and sad feelings" and they intend to do so by "relaxation techniques." Pt reports good sleep, poor concentration, low energy and a good appetite.   Pt provided with medications per providers orders. Pt's labs and vitals were monitored throughout the day. Pt supported emotionally and encouraged to express concerns and questions. Pt educated on medications and relaxation techniques. Pt given a 1:1 to practice progressive relaxation and deep breathing. Pt responded well and states "I feel better after that."  Pt's safety ensured with 15 minute and environmental checks. Pt currently denies SI/HI and A/V hallucinations. Pt verbally agrees to seek staff if SI/HI or A/VH occurs and to consult with staff before acting on these thoughts. Will continue POC.

## 2014-10-09 NOTE — Progress Notes (Signed)
Pt attended wrap-up this evening.  Pt states that she had some low points throughout the day and did a lot of crying but was pleased not to have any suicidal thoughts today.  Pt states that she mainly stayed to herself most of the day d/t having some stressful moments as a result of another pt's actions, so to help relieve some of the stress pt did a lot of her coloring pages which she says, "is a type of meditation for me".  Pts goal for today was, "to think positive and to not be low".  Pt stated she feels as though she accomplished her goal for the day "for the most part".   Rick Duff, MHT

## 2014-10-09 NOTE — Progress Notes (Signed)
[facility] MD Progress Note  10/09/2014 12:34 PM Brittany [name]  MRN:  [id number]   Subjective:  "I am very anxious today. I do not want to feel this way."  Objective:  Patient seen face to face, chart reviewed and discussed with staff.  Patient appears to be anxious , tearful. Pt follows up with Dr.Arfeen as well as a therapist on the out patient clinic at [facility] - continues to struggle with anxiety issues. Her recurrent anxiety sx contributes to her depression as well as crying spells . Pt tolerating medications well. States she has not been using her Klonopin here as much  Would be willing to try Vistaril for anxiety sx. Discussed coping skills - progressive relaxation techniques. Pt denies any ADRs of medications. .   Principal Problem: Severe recurrent major depression without psychotic features Diagnosis:   Patient Active Problem List   Diagnosis Date Noted  . MDD (major depressive disorder), recurrent severe, without psychosis [F33.2]   . Suicidal ideation [R45.851] 10/05/2014  . Depression [F32.9] 06/12/2014  . Rash [R21] 01/04/2014  . Normocytic anemia [D64.9] 01/04/2014  . DJD (degenerative joint disease) [M19.90] 01/04/2014  . Hypertension [I10] 01/04/2014  . Dyslipidemia [E78.5] 01/04/2014  . GERD (gastroesophageal reflux disease) [K21.9] 01/04/2014  . Obstructive sleep apnea [G47.33] 01/04/2014  . 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 Abdominal abscess [K65.1] 01/01/2014  . Wound infection after surgery [T81.4XXA] 01/01/2014  . PTSD (post-traumatic stress disorder) [F43.10] 11/23/2013  . Severe recurrent major depression without psychotic features [F33.2] 11/22/2013  . 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 Morbid obesity, Weight - 312, BMI - 45.2 [E66.01] 04/21/2012   Total Time spent with patient: 30 minutes   Past Medical History:  Past Medical History  Diagnosis Date  . Migraine   . Hypertension   .  Hypercholesterolemia   . Depression   . Morbid obesity   . Sleep apnea     no CPAP machine   . H/O hiatal hernia   . GERD (gastroesophageal reflux disease)     hx of   . Arthritis     hips, knees and hands   . Anxiety   . PTSD (post-traumatic stress disorder)     Past Surgical History  Procedure Laterality Date  . Foot surgery    . Breath tek h pylori  05/03/2012    Procedure: BREATH TEK H PYLORI;  Surgeon: [name], MD;  Location: [name];  Service: General;  Laterality: N/A;  . Right tube and ovary removed     . Abdominal hysterectomy  1997 & 2006  . Colonoscopy      hx of benign polyps   . 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 Upper gi endoscopy N/A 07/05/2012    Procedure: UPPER GI ENDOSCOPY;  Surgeon: [name], MD;  Location: [facility];  Service: General;  Laterality: N/A;  . Laparoscopic lysis of adhesions N/A 07/05/2012    Procedure: LAPAROSCOPIC LYSIS OF ADHESIONS;  Surgeon: [name], MD;  Location: [facility];  Service: General;  Laterality: N/A;  repair of abdominal wall hernia  . Irrigation and debridement abscess N/A 01/01/2014    Procedure: IRRIGATION AND DEBRIDEMENT ABSCESS;  Surgeon: [name], MD;  Location: [facility];  Service: General;  Laterality: N/A;  . Gastric bypass     Family History:  Family History  Problem Relation Age of Onset  . Cancer Mother     colon  . Cancer Maternal Aunt     breast  . Depression Father   . Depression Sister   . Post-traumatic stress disorder Sister   . Alcohol abuse Paternal Uncle   . Bipolar disorder Cousin   . Anxiety disorder Sister    Social History:  History  Alcohol Use No    Comment: Occasional use     History  Drug Use No    History   Social History  . Marital Status: Married    Spouse Name: N/A  . Number of Children: N/A  . Years of Education: N/A    Social History Main Topics  . Smoking status: Former Smoker    Types: Cigars    Quit date: 04/27/2004  . Smokeless tobacco: Never Used  . Alcohol Use: No     Comment: Occasional use  . Drug Use: No  . Sexual Activity: Yes   Other Topics Concern  . None   Social History Narrative   Additional History:    Sleep: Fair, slept better last night  Appetite:  Good    Musculoskeletal: Strength & Muscle Tone: within normal limits Gait & Station: normal Patient leans: N/A   Psychiatric Specialty Exam: Physical Exam  Nursing note and vitals reviewed. Constitutional: She is oriented to person, place, and time.  Neck: Normal range of motion.  Respiratory: Effort normal.  Musculoskeletal: Normal range of motion.  Neurological: She is alert and oriented to person, place, and time.  Psychiatric: Her mood appears anxious. She is withdrawn. Thought content is paranoid. Thought content is not delusional. She exhibits a depressed mood. She expresses suicidal ideation. She expresses no homicidal ideation. She expresses no suicidal plans.    Review of Systems  Musculoskeletal: Positive for joint pain.  Psychiatric/Behavioral: Positive for depression. Negative for hallucinations. The patient is nervous/anxious and has insomnia.   All other systems reviewed and are negative.   Blood pressure 135/78, pulse 107, temperature 98.4 F (36.9 C), temperature source Oral, resp. rate 17, height 5\' 10"  (1.778 m), weight 91.627 kg (202 lb).Body mass index is 28.98 kg/(m^2).  General Appearance: Casual  Eye Contact::  Good  Speech:  Clear and Coherent and Normal Rate  Volume:  Normal  Mood:  Anxious, Depressed and Hopeless  Affect:  Depressed and Tearful  Thought Process:  Goal Directed  Orientation:  Full (Time, Place, and Person)  Thought Content:  Rumination  Suicidal Thoughts:  No  Homicidal Thoughts:  No  Memory:  Immediate;   Good Recent;   Good Remote;   Good  Judgement:  Fair   Insight:  Fair  Psychomotor Activity:  Restlessness  Concentration:  Fair  Recall:  Good  Fund of Knowledge:Good  Language: Good  Akathisia:  Negative  Handed:  Right  AIMS (if indicated):     Assets:  Communication Skills Desire for Improvement Housing Intimacy Physical Health Social Support  ADL's:  Intact  Cognition: WNL  Sleep:  Number of Hours: 6.75     Current Medications: Current Facility-Administered Medications  Medication Dose Route Frequency Provider Last Rate Last Dose  . acetaminophen (TYLENOL) tablet 650 mg  650 mg Oral Q6H PRN Kerrie Buffalo, NP   650 mg at 10/08/14 0907  . alum & mag hydroxide-simeth (MAALOX/MYLANTA) 200-200-20 MG/5ML suspension 30 mL  30 mL Oral Q4H PRN Kerrie Buffalo, NP      . clonazePAM [facility]) tablet 1 mg  1 mg Oral QHS Hampton Abbot, MD   1 mg at 10/08/14 2158  . DULoxetine (CYMBALTA) DR capsule 60 mg  60 mg Oral BID Harriet Butte, NP   60 mg at 10/09/14 0835  . hydrOXYzine (ATARAX/VISTARIL) tablet 25 mg  25 mg Oral TID Ursula Alert, MD   25 mg at 10/09/14 1210  . lithium carbonate (ESKALITH) CR tablet 450 mg  450 mg Oral BID WC Shuvon B Rankin, NP   450 mg at 10/09/14 0835  . loratadine (CLARITIN) tablet 10 mg  10 mg Oral Daily Harriet Butte, NP   10 mg at 10/09/14 0835  . magnesium hydroxide (MILK OF MAGNESIA) suspension 30 mL  30 mL Oral Daily PRN Kerrie Buffalo, NP      . QUEtiapine (SEROQUEL) tablet 100 mg  100 mg Oral QHS Harriet Butte, NP   100 mg at 10/08/14 2158  . traZODone (DESYREL) tablet 50 mg  50 mg Oral QHS PRN Kerrie Buffalo, NP   50 mg at 10/05/14 2237    Lab Results: No results found for this or any previous visit (from the past 48 hour(s)).  Physical Findings: AIMS: Facial and Oral Movements Muscles of Facial Expression: None, normal Lips and Perioral Area: None, normal Jaw: None, normal Tongue: None, normal,Extremity Movements Upper (arms, wrists, hands, fingers): None, normal Lower (legs, knees,  ankles, toes): None, normal, Trunk Movements Neck, shoulders, hips: None, normal, Overall Severity Severity of abnormal movements (highest score from questions above): None, normal Incapacitation due to abnormal movements: None, normal Patient's awareness of abnormal movements (rate only patient's report): No Awareness, Dental Status Current problems with teeth and/or dentures?: No Does patient usually wear dentures?: No  CIWA:    COWS:      Assessment: Patient with significant anxiety as well as depressive sx- would readjust her medications.   Treatment Plan Summary: Daily contact with patient to assess and evaluate symptoms and progress in treatment and Medication management Will continue Cymbalta 60 mg po bid for affective sx.  Will continue Lithium CR 450 mg po bid for augmenting the antidepressant. Li level on 10/12/14. Will continue Seroquel 100 mg po qhs for augmenting antidepressant as well as sleep. Will continue Trazodone 50 mg po qhs prn for sleep- can be discontinued if pt continues to be stable on seroquel. Will add Vistaril 25 mg po tid for anxiety sx. Continue Klonopin 1 mg po qhs for anxiety sx. Reviewed labs- Li level ordered. CSW will work on disposition.      Medical Decision Making:  Review of Psycho-Social Stressors (1), Review or order clinical lab tests (1), Review of Last Therapy Session (1), Review of Medication Regimen & Side Effects (2) and Review of New Medication or Change in Dosage (2)   Lauraann Missey, MD 10/09/2014, 12:34 PM

## 2014-10-09 NOTE — BHH Group Notes (Signed)
[facility] LCSW Aftercare Discharge Planning Group Note   10/09/2014 11:52 AM    Participation Quality:  Appropraite  Mood/Affect:  Appropriate  Depression Rating:  7  Anxiety Rating:  10  Thoughts of Suicide:  No  Will you contract for safety?   NA  Current AVH:  No  Plan for Discharge/Comments:  Patient attended discharge planning group and actively participated in group. She is followed outpatient by [facility]. Suicide prevention education reviewed and SPE document provided.   Transportation Means: Patient has transportation.   Supports:  Patient has a support system.   Brittany [name], E[place] Post

## 2014-10-09 NOTE — Plan of Care (Signed)
Problem: Ineffective individual coping Goal: STG:Pt. will utilize relaxation techniques to reduce stress STG: Patient will utilize relaxation techniques to reduce stress levels  Outcome: Progressing Pt able to utilize deep breathing and progressive relaxation during increasing times of stress

## 2014-10-09 NOTE — Progress Notes (Signed)
[facility] Group Notes:  (Nursing/MHT/Case Management/Adjunct)  Date:  10/09/2014  Time:  8:53 PM  Type of Therapy:  Psychoeducational Skills  Participation Level:  Active  Participation Quality:  Appropriate  Affect:  Appropriate  Cognitive:  Appropriate  Insight:  Appropriate  Engagement in Group:  Engaged  Modes of Intervention:  Discussion  Summary of Progress/Problems: Tonight in wrap up group [name] said that today was a 7 for her. She said that she was emotional for a bit because of a medication change but that Judson Roch (the nurse) helped her day tremendously she said that she sat with her and was able to help her with breathing exercises to calm herself down. Overall she was pleased with her caring gesture and had a good day. [name] 10/09/2014, 8:53 PM

## 2014-10-09 NOTE — Progress Notes (Signed)
Recreation Therapy Notes  Date: 06.27.16 Time: 9:30 am Location: [address] Dayroom  Group Topic: Stress Management  Goal Area(s) Addresses:  Patient will verbalize importance of using healthy stress management.  Patient will identify positive emotions associated with healthy stress management.   Intervention: Stress Management  Activity :  Guided Automotive engineer.  LRT introduced and educated patients on stress management technique of guided imagery.  A script was used to to deliver the technique to patients.  Patients were asked to follow script read aloud by LRT to engage in the stress management technique.  Education:  Stress Management, Discharge Planning.   Clinical Observations/Feedback: Patient did not attend group.  Victorino Sparrow , LRT/CTRS         Victorino Sparrow A 10/09/2014 5:10 PM

## 2014-10-09 NOTE — Progress Notes (Signed)
Patient ID: Brittany [name], female   DOB: [birth year], 49 y.o.   MRN: [id number] D: "my day has been up and down but I'm not suicidal". Pt reports this is the first day she has not has the urge to hurt herself. Reports she is getting better everyday. Pt reports having a lot of support from husband, kids and parents. Cooperative with assessment. No acute distressed noted at this time.   A: Met with pt 1:1. Medications administered as prescribed. Support and encouragement provided. Pt encouraged to discuss feelings and come to staff with any question or concerns.   R: Patient is complaint with medications. Pt attended evening wrap up group and engage in discussion. Pt denies SI/HI/AVH and pain.

## 2014-10-10 NOTE — Progress Notes (Signed)
[facility] Group Notes:  (Nursing/MHT/Case Management/Adjunct)  Date:  10/10/2014  Time:  8:54 PM  Type of Therapy:  Psychoeducational Skills  Participation Level:  Active  Participation Quality:  Appropriate  Affect:  Appropriate  Cognitive:  Appropriate  Insight:  Good  Engagement in Group:  Engaged  Modes of Intervention:  Discussion  Summary of Progress/Problems: Tonight in wrap up group Brittany [name] said that her day has been about a 7.5-8. She is a dog lover so being able to have pet therapy and see the Braymer Retriever, [name] was a plus for her. She said she felt like a 49 yr old on Christmas Day    [name] 10/10/2014, 8:54 PM

## 2014-10-10 NOTE — BHH Group Notes (Signed)
[facility] LCSW Group Therapy      Feelings About Diagnosis 1:15 - 2:30 PM         10/10/2014 2:46 PM    Type of Therapy:  Group Therapy  Participation Level:  Active  Participation Quality:  Appropriate  Affect:  Appropriate  Cognitive:  Alert and Appropriate  Insight:  Developing/Improving and Engaged  Engagement in Therapy:  Developing/Improving and Engaged  Modes of Intervention:  Discussion, Education, Exploration, Problem-Solving, Rapport Building, Support  Summary of Progress/Problems:  Patient actively participated in group. Patient did not participate in the discussion.  Concha Pyo 10/10/2014  2:46 PM

## 2014-10-10 NOTE — Progress Notes (Signed)
Patient ID: Brittany [name], female   DOB: [birth year], 49 y.o.   MRN: [id number] Pt currently presents with a flat affect and depressed behavior. Per self inventory, pt rates depression at  A 7, hopelessness 5 and anxiety 6. Pt's daily goal is to "stay positive" and they intend to do so by "group." Pt reports good sleep, poor concentration, low energy and a good appetite. Pt affect brighter today.   Pt provided with medications per providers orders. Pt's labs and vitals were monitored throughout the day. Pt supported emotionally and encouraged to express concerns and questions. Pt educated on medications and depression.  Pt's safety ensured with 15 minute and environmental checks. Pt currently denies SI/HI and A/V hallucinations. Pt verbally agrees to seek staff if SI/HI or A/VH occurs and to consult with staff before acting on these thoughts. Will continue POC.

## 2014-10-10 NOTE — Progress Notes (Signed)
Patient ID: Brittany [name], female   DOB: 1966/02/19, 49 y.o.   MRN: [id number] D: Today was pt's wedding anniversary. Pt reports her day was challenging but feels better now. Pt mood and affect appeared anxious. Pt observed in dayroom interacting with peers. Cooperative with assessment. No acute distressed noted at this time.   A: Met with pt 1:1. Medications administered as prescribed. Support and encouragement provided. Pt encouraged to discuss feelings and come to staff with any question or concerns.   R: Patient is complaint with medications. Pt attended evening wrap up group and engage in discussion. Pt denies SI/HI/AVH and pain.

## 2014-10-10 NOTE — Progress Notes (Signed)
Recreation Therapy Notes  Animal-Assisted Activity (AAA) Program Checklist/Progress Notes Patient Eligibility Criteria Checklist & Daily Group note for Rec Tx Intervention  Date: 06.28.16 Time: 2:45 pm Location: [address]  AAA/T Program Assumption of Risk Form signed by Patient/ or Parent Legal Guardian yes  Patient is free of allergies or sever asthma yes  Patient reports no fear of animals yes  Patient reports no history of cruelty to animalsyes  Patient understands his/her participation is voluntary yes  Patient washes hands before animal contact yes  Patient washes hands after animal contact yes  Behavioral Response: Engaged  Education: Contractor, Appropriate Animal Interaction   Education Outcome: Acknowledges understanding/In group clarification offered  Clinical Observations/Feedback: Patient attended group.   Victorino Sparrow, LRT/CTRS         Victorino Sparrow A 10/10/2014 4:02 PM
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
## 2014-10-10 NOTE — Progress Notes (Signed)
Patient ID: Brittany [name], female   DOB: 1965/07/14, 49 y.o.   MRN: [id number]  Adult Psychoeducational Group Note  Date:  10/10/2014 Time: 09:00am  Group Topic/Focus:  Orientation:   The focus of this group is to educate the patient on the purpose and policies of crisis stabilization and provide a format to answer questions about their admission.  The group details unit policies and expectations of patients while admitted.  Participation Level:  Active  Participation Quality:  Appropriate and Attentive  Affect:  Flat  Cognitive:  Alert, Appropriate and Oriented  Insight: Good  Engagement in Group:  Engaged  Modes of Intervention:  Discussion, Education, Orientation and Support  Additional Comments:  Pt able to identify one daily goal to accomplish today.   Elenore Rota 10/10/2014, 2:52 PM

## 2014-10-10 NOTE — Plan of Care (Signed)
Problem: Diagnosis: Increased Risk For Suicide Attempt Goal: STG-Patient Will Attend All Groups On The Unit Outcome: Progressing Pt is safe and attended evening wrap up group.

## 2014-10-10 NOTE — Tx Team (Signed)
Interdisciplinary Treatment Plan Update (Adult)  Date:  10/10/2014  Time Reviewed:  9:02 AM   Progress in Treatment: Attending groups: Patient is attending groups. Participating in groups:  Patient engages in discussion Taking medication as prescribed:  Patient is taking medications Tolerating medication:  Patient is tolerating medications Family/Significant othe contact made: Yes, collateral contact with husband. Patient understands diagnosis:Yes, patient understands diagnosis and need for treatment Discussing patient identified problems/goals with staff:  Yes, patient is able to express goals/problems Medical problems stabilized or resolved:  Yes Denies suicidal/homicidal ideation: Yes, patient is denying SI/HI. Issues/concerns per patient self-inventory:   Other:  Discharge Plan or Barriers:  Home with outpatient follow up [facility]  Reason for Continuation of Hospitalization: Anxiety Depression Medication stabilization  Comments:  Continue medication stabilization  Additional comments:  Patient and CSW reviewed Patient Discharge Process Letter/Patient Involvement Form.  Patient verbalized understanding and signed form.  Patient and CSW also reviewed and identified patient's goals and treatment plan.  Patient verbalized understanding and agreed to plan.  Estimated length of stay: 2-4 days  New goal(s):  Review of initial/current patient goals per problem list:  Please see plan of careInterdisciplinary Treatment Plan Update (Adult)  Attendees: Patient 10/10/2014 9:02 AM   Family:   10/10/2014 9:02 AM   Physician:  Neita Garnet, MD 10/10/2014 9:02 AM   Nursing:   Marcella Dubs, RN 10/10/2014 9:02 AM   Clinical Social Worker:  Joette Catching, LCSW 10/10/2014 9:02 AM   Clinical Social Worker:  Erasmo Downer Drinkard, LCSW-A 10/10/2014 9:02 AM   Case Manager:   10/10/2014 9:02 AM   Other:  Darrol Angel, RN 10/10/2014 9:02 AM  Other:   10/10/2014  9:02 AM   Other:  10/10/2014  9:02 AM   Other:  10/10/2014 9:02 AM   Other:  10/10/2014 9:02 AM   Other:  Jake Bathe Transition Team Coordinator 10/10/2014 9:02 AM   Other:   10/10/2014 9:02 AM   Other:  10/10/2014 9:02 AM   Other:   10/10/2014 9:02 AM    Scribe for Treatment Team:   Concha Pyo, 10/10/2014   9:02 AM
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
## 2014-10-11 ENCOUNTER — Ambulatory Visit (HOSPITAL_COMMUNITY): Payer: Self-pay | Admitting: Clinical

## 2014-10-11 NOTE — Plan of Care (Signed)
Problem: Diagnosis: Increased Risk For Suicide Attempt Goal: STG-Patient Will Comply With Medication Regime Outcome: Progressing Pt is safe and compliant with medication.

## 2014-10-11 NOTE — Plan of Care (Signed)
Problem: Ineffective individual coping Goal: LTG: Patient will report a decrease in negative feelings Outcome: Completed/Met Date Met:  10/11/14 Pt states "I feel much better today"

## 2014-10-11 NOTE — Progress Notes (Signed)
Patient ID: Brittany [name], female   DOB: 05/21/1965, 49 y.o.   MRN: [id number] D: Pt reports her day was well. Pt reports Dr. Adele Schilder came to see her and happy she is in communication with Dr. Parke Poisson about her care. Pt mood and affect appeared anxious. Cooperative with assessment. No acute distressed noted at this time.   A: Met with pt 1:1. Medications administered as prescribed. Support and encouragement provided. Pt encouraged to discuss feelings and come to staff with any question or concerns.   R: Patient is complaint with medications. Pt attended evening wrap up group and engage in discussion. Pt denies SI/HI/AVH and pain.

## 2014-10-11 NOTE — [facility] Group Notes (Signed)
[place] LCSW Group Therapy  Emotional Regulation 1:15 - 2: 30 PM        10/11/2014     Type of Therapy:  Group Therapy  Participation Level:  Appropriate  Participation Quality:  Appropriate  Affect:  Appropriate  Cognitive:  Attentive Appropriate  Insight:  Developing/Improving Engaged  Engagement in Therapy:  Developing/Improving Engaged  Modes of Intervention:  Discussion Exploration Problem-Solving Supportive  Summary of Progress/Problems:  Group topic was emotional regulations.  Patient participated in the discussion and was able to identify fear as the emotion she needs to regulate. She talked about how she can become overwhelmed with fear in a store and have to leave. Concha Pyo 10/11/2014

## 2014-10-11 NOTE — Progress Notes (Signed)
Adult Psychoeducational Group Note  Date:  10/11/2014 Time:  9:30 PM  Group Topic/Focus:  Wrap-Up Group:   The focus of this group is to help patients review their daily goal of treatment and discuss progress on daily workbooks.  Participation Level:  Active  Participation Quality:  Appropriate  Affect:  Appropriate  Cognitive:  Appropriate  Insight: Appropriate  Engagement in Group:  Engaged  Modes of Intervention:  Discussion  Additional Comments:  The patient expressed that she attended group.The patient also said that meeting friends has help her with her situations. Brittany [name] 10/11/2014, 9:30 PM

## 2014-10-11 NOTE — Progress Notes (Signed)
D: Patient in her room coloring on approach.  Patient states she had a good day today.  Patient states she had a good visit with her husband.  Patient states she had a good talk wt her doctor today and feels hopeful about her treatment.  Patient states her goal for today was to drink more water and to attend groups.  Patient states she met her goal for today.  Patient denies SI/HI and denies AVH. A: Staff to monitor Q 15 mins for safety.  Encouragement and support offered.  Scheduled medications administered per orders. R: Patient remains safe on the unit.  Patient attended group tonight.  Patient visible on the unit and interacting with peers.  Patient taking adminsitered medications.

## 2014-10-11 NOTE — Progress Notes (Signed)
Patient ID: Brittany [name], female   DOB: January 09, 1966, 49 y.o.   MRN: [id number] [facility] MD Progress Note  10/11/2014 11:24 AM Brittany [name]  MRN:  [id number]   Subjective:  Patient still depressed, anxious, but states " I feel I am getting better". Denies any suicidal ideations. At this time is tolerating medications well.   Objective:  Patient seen face to face, case discussed with staff.   She is going to groups, participating in milieu. She states she still feels vaguely depressed, anxious, feeling easily overwhelmed, but states she is better than prior to admission. States depression is now 6/10. States anxiety does continue to be more significant , which she states is 8/10. She denies any SI. There are no psychotic symptoms. We have reviewed history, and I have also discussed case with Dr. Adele Schilder, her outpatient psychiatrist. Depression and anxiety are major issues, no clear history of mania, but may have brief episodes of increased irritability, mood swings - these tend to be brief.  So far she is tolerating medications well- she has had no side effects from Lithium thus far, and today has no tremors, no nausea.  She responds well to support, encouragement, empathy.  Her husband is very supportive and she states he is visiting her on unit regularly.    Principal Problem: Severe recurrent major depression without psychotic features Diagnosis:   Patient Active Problem List   Diagnosis Date Noted  . MDD (major depressive disorder), recurrent severe, without psychosis [F33.2]   . Suicidal ideation [R45.851] 10/05/2014  . Depression [F32.9] 06/12/2014  . Rash [R21] 01/04/2014  . Normocytic anemia [D64.9] 01/04/2014  . DJD (degenerative joint disease) [M19.90] 01/04/2014  . Hypertension [I10] 01/04/2014  . Dyslipidemia [E78.5] 01/04/2014  . GERD (gastroesophageal reflux disease) [K21.9] 01/04/2014  . Obstructive sleep apnea [G47.33] 01/04/2014  . Status post small bowel resection [Z98.89]  01/04/2014  . Abdominal abscess [K65.1] 01/01/2014  . Wound infection after surgery [T81.4XXA] 01/01/2014  . PTSD (post-traumatic stress disorder) [F43.10] 11/23/2013  . Severe recurrent major depression without psychotic features [F33.2] 11/22/2013  . 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 Morbid obesity, Weight - 312, BMI - 45.2 [E66.01] 04/21/2012   Total Time spent with patient: 25 minutes    Past Medical History:  Past Medical History  Diagnosis Date  . Migraine   . Hypertension   . Hypercholesterolemia   . Depression   . Morbid obesity   . Sleep apnea     no CPAP machine   . H/O hiatal hernia   . GERD (gastroesophageal reflux disease)     hx of   . Arthritis     hips, knees and hands   . Anxiety   . PTSD (post-traumatic stress disorder)     Past Surgical History  Procedure Laterality Date  . Foot surgery    . Breath tek h pylori  05/03/2012    Procedure: BREATH TEK H PYLORI;  Surgeon: [name], MD;  Location: [name];  Service: General;  Laterality: N/A;  . Right tube and ovary removed     . Abdominal hysterectomy  1997 & 2006  . Colonoscopy      hx of benign polyps   . 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 Upper gi endoscopy N/A 07/05/2012    Procedure: UPPER GI ENDOSCOPY;  Surgeon: Shanon Brow  Bary Leriche, MD;  Location: [facility];  Service: General;  Laterality: N/A;  . Laparoscopic lysis of adhesions N/A 07/05/2012    Procedure: LAPAROSCOPIC LYSIS OF ADHESIONS;  Surgeon: [name], MD;  Location: [facility];  Service: General;  Laterality: N/A;  repair of abdominal wall hernia  . Irrigation and debridement abscess N/A 01/01/2014    Procedure: IRRIGATION AND DEBRIDEMENT ABSCESS;  Surgeon: [name], MD;  Location: [facility];  Service: General;  Laterality: N/A;  . Gastric bypass     Family History:   Family History  Problem Relation Age of Onset  . Cancer Mother     colon  . Cancer Maternal Aunt     breast  . Depression Father   . Depression Sister   . Post-traumatic stress disorder Sister   . Alcohol abuse Paternal Uncle   . Bipolar disorder Cousin   . Anxiety disorder Sister    Social History:  History  Alcohol Use No    Comment: Occasional use     History  Drug Use No    History   Social History  . Marital Status: Married    Spouse Name: N/A  . Number of Children: N/A  . Years of Education: N/A   Social History Main Topics  . Smoking status: Former Smoker    Types: Cigars    Quit date: 04/27/2004  . Smokeless tobacco: Never Used  . Alcohol Use: No     Comment: Occasional use  . Drug Use: No  . Sexual Activity: Yes   Other Topics Concern  . None   Social History Narrative   Additional History:    Sleep: improved   Appetite:  Good    Musculoskeletal: Strength & Muscle Tone: within normal limits Gait & Station: normal Patient leans: N/A   Psychiatric Specialty Exam: Physical Exam  Nursing note and vitals reviewed. Constitutional: She is oriented to person, place, and time.  Neck: Normal range of motion.  Respiratory: Effort normal.  Musculoskeletal: Normal range of motion.  Neurological: She is alert and oriented to person, place, and time.  Psychiatric: Her mood appears anxious. She is withdrawn. Thought content is paranoid. Thought content is not delusional. She exhibits a depressed mood. She expresses suicidal ideation. She expresses no homicidal ideation. She expresses no suicidal plans.    Review of Systems  Musculoskeletal: Positive for joint pain.  Psychiatric/Behavioral: Positive for depression. Negative for hallucinations. The patient is nervous/anxious and has insomnia.   All other systems reviewed and are negative.   Blood pressure 119/81, pulse 78, temperature 97.7 F (36.5 C), temperature source Oral, resp. rate 16, height  5\' 10"  (1.778 m), weight 202 lb (91.627 kg).Body mass index is 28.98 kg/(m^2).  General Appearance: Casual  Eye Contact::  Good  Speech:  Clear and Coherent and Normal Rate  Volume:  Normal  Mood:  Depressed, but   Partial improvement    Affect:  Constricted and anxious, but more reactive than upon admission  Thought Process:  Goal Directed  Orientation:  Full (Time, Place, and Person)  Thought Content:  denies hallucinations, no delusions  Suicidal Thoughts:  No at this time denies any thoughts of hurting or killing self   Homicidal Thoughts:  No  Memory:  Immediate;   Good Recent;   Good Remote;   Good  Judgement:  Other:  improved  Insight:  Present  Psychomotor Activity:  Normal  Concentration:  Good  Recall:  Good  Fund of Knowledge:Good  Language: Good  Akathisia:  Negative  Handed:  Right  AIMS (if indicated):     Assets:  Communication Skills Desire for Improvement Housing Intimacy Physical Health Social Support  ADL's:  Intact  Cognition: WNL  Sleep:  Number of Hours: 6.75     Current Medications: Current Facility-Administered Medications  Medication Dose Route Frequency Provider Last Rate Last Dose  . acetaminophen (TYLENOL) tablet 650 mg  650 mg Oral Q6H PRN Kerrie Buffalo, NP   650 mg at 10/11/14 1012  . alum & mag hydroxide-simeth (MAALOX/MYLANTA) 200-200-20 MG/5ML suspension 30 mL  30 mL Oral Q4H PRN Kerrie Buffalo, NP      . clonazePAM Bobbye Charleston) tablet 1 mg  1 mg Oral QHS Hampton Abbot, MD   1 mg at 10/10/14 2122  . DULoxetine (CYMBALTA) DR capsule 60 mg  60 mg Oral BID Harriet Butte, NP   60 mg at 10/11/14 0759  . hydrOXYzine (ATARAX/VISTARIL) tablet 25 mg  25 mg Oral TID Ursula Alert, MD   25 mg at 10/11/14 0758  . lithium carbonate (ESKALITH) CR tablet 450 mg  450 mg Oral BID WC Shuvon B Rankin, NP   450 mg at 10/11/14 0758  . loratadine (CLARITIN) tablet 10 mg  10 mg Oral Daily Harriet Butte, NP   10 mg at 10/11/14 0759  . magnesium hydroxide  (MILK OF MAGNESIA) suspension 30 mL  30 mL Oral Daily PRN Kerrie Buffalo, NP      . QUEtiapine (SEROQUEL) tablet 100 mg  100 mg Oral QHS Harriet Butte, NP   100 mg at 10/10/14 2122    Lab Results: No results found for this or any previous visit (from the past 48 hour(s)).  Physical Findings: AIMS: Facial and Oral Movements Muscles of Facial Expression: None, normal Lips and Perioral Area: None, normal Jaw: None, normal Tongue: None, normal,Extremity Movements Upper (arms, wrists, hands, fingers): None, normal Lower (legs, knees, ankles, toes): None, normal, Trunk Movements Neck, shoulders, hips: None, normal, Overall Severity Severity of abnormal movements (highest score from questions above): None, normal Incapacitation due to abnormal movements: None, normal Patient's awareness of abnormal movements (rate only patient's report): No Awareness, Dental Status Current problems with teeth and/or dentures?: No Does patient usually wear dentures?: No  CIWA:    COWS:      Assessment: Remains  Depressed, anxious, at this time reporting anxiety ( a subjective sense of worry, anxiety) as more significant than depression. She does feel medications are helping, and so far has tolerated Lithium well.  She has no clear history of bipolarity, emphasizes anxiety and depression but may have brief episodes of increased irritability at times . Currently  Has no suicidal ideations and no psychotic symptoms.. Insomnia/sleep improved on current medications.  Tolerating medications well  Thus far.   Treatment Plan Summary: Daily contact with patient to assess and evaluate symptoms and progress in treatment and Medication management Will continue Cymbalta 60 mg BID for depression Will continue Lithium CR 450 mg BID  As antidepressant augmentation .   Li level on 10/12/14.  Will continue Seroquel 100 mg QHS for insomnia, mood disorder  Continue  Vistaril 25 mg po TID  for anxiety  Continue Klonopin 1  mg QHS for insomnia . Will obtain Lipid panel and Hgb A1C for tomorrow ( as on Seroquel). She did have a lipid panel done a few months ago which showed mild hypercholesterolemia so follow up lab  to insure Seroquel is not negatively affecting lipid levels is warranted .  Medical Decision Making:  Review of Psycho-Social Stressors (1), Review or order clinical lab tests (1), Review of Last Therapy Session (1), Review of Medication Regimen & Side Effects (2) and Review of New Medication or Change in Dosage (2)   COBOS, FERNANDO, MD 10/11/2014, 11:24 AM

## 2014-10-11 NOTE — Progress Notes (Signed)
Patient ID: Brittany [name], female   DOB: 06-07-1965, 49 y.o.   MRN: [id number]  Pt currently presents with a flat affect and depressed behavior. Per self inventory, pt rates depression at a 7, hopelessness 5 and anxiety 9. Pt's daily goal is to "FMLA in paperwork, staying positive and patient" and they intend to do so by "relaxation exercises, group." Pt reports good sleep, good concentration, low energy and a good appetite. Pt tearful at times today.   Pt provided with medications per providers orders. Pt's labs and vitals were monitored throughout the day. Pt supported emotionally and encouraged to express concerns and questions. Pt educated on medications and depression, bipolar disorder.   Pt's safety ensured with 15 minute and environmental checks. Pt currently denies SI/HI and A/V hallucinations. Pt verbally agrees to seek staff if SI/HI or A/VH occurs and to consult with staff before acting on these thoughts. Will continue POC.

## 2014-10-11 NOTE — [facility] Group Notes (Signed)
[facility] LCSW Aftercare Discharge Planning Group Note   10/11/2014 12:27 PM    Participation Quality:  Appropraite  Mood/Affect:  Appropriate  Depression Rating:  7  Anxiety Rating:  9  Thoughts of Suicide:  No  Will you contract for safety?   NA  Current AVH:  No  Plan for Discharge/Comments:  Patient attended discharge planning group and actively participated in group. She advised of having ups and downs.  She will continue follow up with [facility].  Suicide prevention education reviewed and SPE document provided.   Transportation Means: Patient has transportation.   Supports:  Patient has a support system.   Aerie Donica, Eulas Post

## 2014-10-12 LAB — LIPID PANEL
CHOLESTEROL: 235 mg/dL — AB (ref 0–200)
HDL: 67 mg/dL (ref >40)
LDL Cholesterol: 153 mg/dL — ABNORMAL HIGH (ref 0–99)
Total CHOL/HDL Ratio: 3.5 RATIO
Triglycerides: 75 mg/dL (ref <150)
VLDL: 15 mg/dL (ref 0–40)

## 2014-10-12 LAB — LITHIUM LEVEL: Lithium Lvl: 0.71 mmol/L (ref 0.60–1.20)

## 2014-10-12 NOTE — Progress Notes (Signed)
Patient ID: Brittany [name], female   DOB: [birth year], 49 y.o.   MRN: [id number] [facility] MD Progress Note  10/12/2014 5:40 PM Brittany [name]  MRN:  [id number]   Subjective:   Patient describes partial improvement compared to admission- states she was feeling better earlier today, but felt that another patient's unempathic remarks during a group session " made me depressed for a while, but now I am better again". Denies medication side effects.  Objective:  Patient seen face to face, case discussed with staff.   She is going to groups, participating in milieu. Depression is persistent and still feels sad, and emotionally frail, easily overwhelmed or emotional. She states, however, that she notices some improvement and feels medications are helping. Denies side effects at this time. Visits from husband have helped and she states that he is very supportive . No disruptive or self injurious ideations on unit- pleasant and cooperative . Patient has a history of bariatric surgery in 2014 after which she lost significant weight- after she was started on Seroquel a few months ago has gained about 10 lbs- currently not concerned about this, but states " I do not want to continue gaining weight either".  No akathisia.  Lithium level 0.71 ( therapeutic ) - no side effects, no tremors, no nausea. Lipid panel- mild hypercholesterolemia, elevated LDL, but no significant change  Compared to a prior lipid panel done several months ago     Principal Problem: Severe recurrent major depression without psychotic features Diagnosis:   Patient Active Problem List   Diagnosis Date Noted  . MDD (major depressive disorder), recurrent severe, without psychosis [F33.2]   . Suicidal ideation [R45.851] 10/05/2014  . Depression [F32.9] 06/12/2014  . Rash [R21] 01/04/2014  . Normocytic anemia [D64.9] 01/04/2014  . DJD (degenerative joint disease) [M19.90] 01/04/2014  . Hypertension [I10] 01/04/2014  . Dyslipidemia [E78.5]  01/04/2014  . GERD (gastroesophageal reflux disease) [K21.9] 01/04/2014  . Obstructive sleep apnea [G47.33] 01/04/2014  . 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 Abdominal abscess [K65.1] 01/01/2014  . Wound infection after surgery [T81.4XXA] 01/01/2014  . PTSD (post-traumatic stress disorder) [F43.10] 11/23/2013  . Severe recurrent major depression without psychotic features [F33.2] 11/22/2013  . 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 Morbid obesity, Weight - 312, BMI - 45.2 [E66.01] 04/21/2012   Total Time spent with patient: 25 minutes    Past Medical History:  Past Medical History  Diagnosis Date  . Migraine   . Hypertension   . Hypercholesterolemia   . Depression   . Morbid obesity   . Sleep apnea     no CPAP machine   . H/O hiatal hernia   . GERD (gastroesophageal reflux disease)     hx of   . Arthritis     hips, knees and hands   . Anxiety   . PTSD (post-traumatic stress disorder)     Past Surgical History  Procedure Laterality Date  . Foot surgery    . Breath tek h pylori  05/03/2012    Procedure: BREATH TEK H PYLORI;  Surgeon: [name], MD;  Location: [name];  Service: General;  Laterality: N/A;  . Right tube and ovary removed     . Abdominal hysterectomy  1997 & 2006  . Colonoscopy      hx of benign polyps   . Gastric roux-en-y N/A 07/05/2012    Procedure: LAPAROSCOPIC ROUX-EN-Y GASTRIC;  Surgeon: [name], MD;  Location: [name];  Service: General;  Laterality: N/A;  . Upper gi endoscopy N/A 07/05/2012    Procedure: UPPER GI ENDOSCOPY;  Surgeon: [name], MD;  Location: [facility];  Service: General;  Laterality: N/A;  . Laparoscopic lysis of adhesions N/A 07/05/2012    Procedure: LAPAROSCOPIC LYSIS OF ADHESIONS;  Surgeon: [name], MD;  Location: [facility];  Service: General;  Laterality: N/A;  repair of abdominal wall hernia  . Irrigation and debridement abscess N/A 01/01/2014    Procedure:  IRRIGATION AND DEBRIDEMENT ABSCESS;  Surgeon: [name], MD;  Location: [facility];  Service: General;  Laterality: N/A;  . Gastric bypass     Family History:  Family History  Problem Relation Age of Onset  . Cancer Mother     colon  . Cancer Maternal Aunt     breast  . Depression Father   . Depression Sister   . Post-traumatic stress disorder Sister   . Alcohol abuse Paternal Uncle   . Bipolar disorder Cousin   . Anxiety disorder Sister    Social History:  History  Alcohol Use No    Comment: Occasional use     History  Drug Use No    History   Social History  . Marital Status: Married    Spouse Name: N/A  . Number of Children: N/A  . Years of Education: N/A   Social History Main Topics  . Smoking status: Former Smoker    Types: Cigars    Quit date: 04/27/2004  . Smokeless tobacco: Never Used  . Alcohol Use: No     Comment: Occasional use  . Drug Use: No  . Sexual Activity: Yes   Other Topics Concern  . None   Social History Narrative   Additional History:    Sleep: improved   Appetite:  Good    Musculoskeletal: Strength & Muscle Tone: within normal limits Gait & Station: normal Patient leans: N/A   Psychiatric Specialty Exam: Physical Exam  Nursing note and vitals reviewed. Constitutional: She is oriented to person, place, and time.  Neck: Normal range of motion.  Respiratory: Effort normal.  Musculoskeletal: Normal range of motion.  Neurological: She is alert and oriented to person, place, and time.  Psychiatric: Her mood appears anxious. She is withdrawn. Thought content is paranoid. Thought content is not delusional. She exhibits a depressed mood. She expresses suicidal ideation. She expresses no homicidal ideation. She expresses no suicidal plans.    Review of Systems  Musculoskeletal: Positive for joint pain.  Psychiatric/Behavioral: Positive for depression. Negative for hallucinations. The patient is nervous/anxious and has  insomnia.   All other systems reviewed and are negative.   Blood pressure 124/49, pulse 63, temperature 97.7 F (36.5 C), temperature source Oral, resp. rate 17, height 5\' 10"  (1.778 m), weight 202 lb (91.627 kg).Body mass index is 28.98 kg/(m^2).  General Appearance: Casual  Eye Contact::  Good  Speech:  Clear and Coherent and Normal Rate  Volume:  Normal  Mood:  Still depressed but improving   Affect:  less constricted, more reactive   Thought Process:  Goal Directed  Orientation:  Full (Time, Place, and Person)  Thought Content:  denies hallucinations, no delusions  Suicidal Thoughts:  No at this time denies any thoughts of hurting or killing self   Homicidal Thoughts:  No  Memory:  Immediate;   Good Recent;   Good Remote;   Good  Judgement:  Other:  improved  Insight:  Present  Psychomotor Activity:  Normal  Concentration:  Good  Recall:  Good  Fund of Knowledge:Good  Language: Good  Akathisia:  Negative  Handed:  Right  AIMS (if indicated):     Assets:  Communication Skills Desire for Improvement Housing Intimacy Physical Health Social Support  ADL's:  Intact  Cognition: WNL  Sleep:  Number of Hours: 6.75     Current Medications: Current Facility-Administered Medications  Medication Dose Route Frequency Provider Last Rate Last Dose  . acetaminophen (TYLENOL) tablet 650 mg  650 mg Oral Q6H PRN Kerrie Buffalo, NP   650 mg at 10/12/14 0804  . alum & mag hydroxide-simeth (MAALOX/MYLANTA) 200-200-20 MG/5ML suspension 30 mL  30 mL Oral Q4H PRN Kerrie Buffalo, NP      . clonazePAM Bobbye Charleston) tablet 1 mg  1 mg Oral QHS Hampton Abbot, MD   1 mg at 10/11/14 2111  . DULoxetine (CYMBALTA) DR capsule 60 mg  60 mg Oral BID Harriet Butte, NP   60 mg at 10/12/14 1709  . hydrOXYzine (ATARAX/VISTARIL) tablet 25 mg  25 mg Oral TID Ursula Alert, MD   25 mg at 10/12/14 1709  . lithium carbonate (ESKALITH) CR tablet 450 mg  450 mg Oral BID WC Shuvon B Rankin, NP   450 mg at  10/12/14 1709  . loratadine (CLARITIN) tablet 10 mg  10 mg Oral Daily Harriet Butte, NP   10 mg at 10/12/14 0803  . magnesium hydroxide (MILK OF MAGNESIA) suspension 30 mL  30 mL Oral Daily PRN Kerrie Buffalo, NP      . QUEtiapine (SEROQUEL) tablet 100 mg  100 mg Oral QHS Harriet Butte, NP   100 mg at 10/11/14 2111    Lab Results:  Results for orders placed or performed during the hospital encounter of 10/05/14 (from the past 48 hour(s))  Lithium level     Status: None   Collection Time: 10/12/14  6:43 AM  Result Value Ref Range   Lithium Lvl 0.71 0.60 - 1.20 mmol/L    Comment: Performed at [facility]  Lipid panel     Status: Abnormal   Collection Time: 10/12/14  6:43 AM  Result Value Ref Range   Cholesterol 235 (H) 0 - 200 mg/dL   Triglycerides 75 <150 mg/dL   HDL 67 >40 mg/dL   Total CHOL/HDL Ratio 3.5 RATIO   VLDL 15 0 - 40 mg/dL   LDL Cholesterol 153 (H) 0 - 99 mg/dL    Comment:        Total Cholesterol/HDL:CHD Risk Coronary Heart Disease Risk Table                     Men   Women  1/2 Average Risk   3.4   3.3  Average Risk       5.0   4.4  2 X Average Risk   9.6   7.1  3 X Average Risk  23.4   11.0        Use the calculated Patient Ratio above and the CHD Risk Table to determine the patient's CHD Risk.        ATP III CLASSIFICATION (LDL):  <100     mg/dL   Optimal  100-129  mg/dL   Near or Above                    Optimal  130-159  mg/dL   Borderline  160-189  mg/dL   High  >190  mg/dL   Very High Performed at [facility]     Physical Findings: AIMS: Facial and Oral Movements Muscles of Facial Expression: None, normal Lips and Perioral Area: None, normal Jaw: None, normal Tongue: None, normal,Extremity Movements Upper (arms, wrists, hands, fingers): None, normal Lower (legs, knees, ankles, toes): None, normal, Trunk Movements Neck, shoulders, hips: None, normal, Overall Severity Severity of abnormal movements (highest  score from questions above): None, normal Incapacitation due to abnormal movements: None, normal Patient's awareness of abnormal movements (rate only patient's report): No Awareness, Dental Status Current problems with teeth and/or dentures?: No Does patient usually wear dentures?: No  CIWA:    COWS:      Assessment:  Partial improvement , but remains depressed, and feels emotionally frail, feeling overly sensitive to perceived criticisms. She has good insight and is motivated in treatment. Denies medication side effects- lithium level therapeutic. Seroquel perceived as helpful but may be causing some weight gain. Lipid panel unchanged compared to several months ago.    Treatment Plan Summary: Daily contact with patient to assess and evaluate symptoms and progress in treatment and Medication management Will continue Cymbalta 60 mg BID for depression Will continue Lithium CR 450 mg BID  As antidepressant augmentation .   Will continue Seroquel 100 mg QHS for insomnia, mood disorder  Continue  Vistaril 25 mg po TID  for anxiety  Continue Klonopin 1 mg QHS for insomnia .       Medical Decision Making:  Review of Psycho-Social Stressors (1), Review or order clinical lab tests (1), Review of Last Therapy Session (1), Review of Medication Regimen & Side Effects (2) and Review of New Medication or Change in Dosage (2)   Gladys Gutman, MD 10/12/2014, 5:40 PM

## 2014-10-12 NOTE — [facility] Group Notes (Signed)
[facility] Group Notes:  (Nursing/MHT/Case Management/Adjunct)  Date:  10/12/2014  Time:  0900am  Type of Therapy:  Nurse Education  Participation Level:  Active  Participation Quality:  Appropriate and Attentive  Affect:  Blunted  Cognitive:  Alert and Appropriate  Insight:  Appropriate  Engagement in Group:  Engaged  Modes of Intervention:  Discussion, Education and Support  Summary of Progress/Problems: Patient attended group, remained engaged, and responded appropriately when prompted.  Charlyne Quale A 10/12/2014, 9:37 AM

## 2014-10-12 NOTE — B[facility] Group Notes (Signed)
[place] LCSW Group Therapy  [facility] 1:15 - 2:30 PM  10/12/2014 3:15 PM   Type of Therapy:  Group Therapy  Participation Level:  Minimal  Participation Quality:  Attentive  Affect:  Appropriate  Cognitive:  Appropriate  Insight:  Developing/Improving   Engagement in Therapy:  Developing/Improving   Modes of Intervention:  Discussion, Education, Exploration, Problem-Solving, Rapport Building, Support   Summary of Progress/Problems:   Patient was attentive to speaker from the [employer] [facility] [employer] as he shared his story of dealing with mental health/substance abuse issues and overcoming it by working a recovery program.  Patient made no comment on the presentation but received information on their agency.    Concha Pyo 10/12/2014 3:15 PM

## 2014-10-12 NOTE — Plan of Care (Signed)
Problem: Alteration in mood Goal: LTG-Patient reports reduction in suicidal thoughts (Patient reports reduction in suicidal thoughts and is able to verbalize a safety plan for whenever patient is feeling suicidal)  Outcome: Progressing Patient denies having any suicidal thoughts today.  Problem: Ineffective individual coping Goal: STG: Patient will remain free from self harm Outcome: Progressing Patient remains free from self harm. 15 minute checks continued per protocol for patient safety.     Problem: Diagnosis: Increased Risk For Suicide Attempt Goal: STG-Patient Will Attend All Groups On The Unit Outcome: Progressing Patient is attending unit groups today. Goal: STG-Patient Will Comply With Medication Regime Outcome: Progressing Patient has adhered to medication regimen today with ease.

## 2014-10-12 NOTE — Plan of Care (Signed)
Problem: Alteration in mood Goal: LTG-Patient reports reduction in suicidal thoughts (Patient reports reduction in suicidal thoughts and is able to verbalize a safety plan for whenever patient is feeling suicidal)  Outcome: Progressing Patient denies SI/HI and denies AVH

## 2014-10-12 NOTE — Progress Notes (Signed)
D: Patient is alert and oriented. Pt's mood and affect is depressed, pleasant upon interaction and blunted. Pt denies SI/HI and AVH. Pt rates depression 6/10, hopelessness 3/10, and anxiety 7/10. Pt reports her goal for the day is "work on my anxiety by using techniques I've learned in therapy." Pt experiencing HTN (See docflowsheet- vitals). Pt complains of headache 4/10 which decreased with PRN medication. Pt became tearful this morning d/t interaction with another pt, pt reports she needs "calmness and other people are just on another level and I have too much anxiety to handle them." Pt is attending unit groups today. A: Active listening by RN. Encouragement/Support provided to pt. Will reassess BP. Medication education reviewed with pt. PRN medication administered for pain per providers orders (See MAR). Scheduled medications administered per providers orders (See MAR). 15 minute checks continued per protocol for patient safety.  R: Patient cooperative and receptive to nursing interventions. Pt remains safe.

## 2014-10-12 NOTE — Progress Notes (Signed)
D:Patient in her room on approach.  Patient states, Today was not a good day."  Patient states she felt overstimulated at times and states she had some panic attacks.  Patient states she was able to work through them by using her coping skills and being in her room.  Patient states the highlight of her day was visiting with her husband.  Patient denies SI/HI and denies AVH.   A: Staff to monitor Q 15 mins for safety.  Encouragement and support offered.  Scheduled medications administered per orders.   R: Patient remains safe on the unit.  Patient did not attend group tonight.  Patient visible on the unit and interacting with peers.  Patient taking administered medications.

## 2014-10-12 NOTE — Progress Notes (Signed)
Pt did not attend karaoke group tonight. Pt stated she had a bad day and wanted to stay in her room.

## 2014-10-13 LAB — HEMOGLOBIN A1C
Hgb A1c MFr Bld: 5.4 % (ref 4.8–5.6)
Mean Plasma Glucose: 108 mg/dL

## 2014-10-13 MED ORDER — HYDROCORTISONE 2.5 % RE CREA: 1.0000 "application " | TOPICAL_CREAM | Freq: Three times a day (TID) | RECTAL | Status: DC

## 2014-10-13 MED ORDER — VITAMIN B-12 500 MCG PO TABS: 1500.0000 ug | ORAL_TABLET | Freq: Every day | ORAL | Status: DC

## 2014-10-13 MED ORDER — QUETIAPINE FUMARATE 100 MG PO TABS: 100.0000 mg | ORAL_TABLET | Freq: Every day | ORAL | Status: DC

## 2014-10-13 MED ORDER — CALCIUM CITRATE 950 (200 CA) MG PO TABS: 200.0000 mg | ORAL_TABLET | Freq: Every day | ORAL | Status: DC

## 2014-10-13 MED ORDER — ACETAMINOPHEN 325 MG PO TABS: 650.0000 mg | ORAL_TABLET | Freq: Four times a day (QID) | ORAL | Status: DC | PRN

## 2014-10-13 MED ORDER — DULOXETINE HCL 60 MG PO CPEP: 60.0000 mg | ORAL_CAPSULE | Freq: Two times a day (BID) | ORAL | Status: DC

## 2014-10-13 MED ORDER — CLONAZEPAM 1 MG PO TABS: 1.0000 mg | ORAL_TABLET | Freq: Every day | ORAL | Status: DC

## 2014-10-13 MED ORDER — VITAMIN D 1000 UNITS PO TABS: 1000.0000 [IU] | ORAL_TABLET | Freq: Every day | ORAL | Status: DC

## 2014-10-13 MED ORDER — CETIRIZINE HCL 10 MG PO TABS: 10.0000 mg | ORAL_TABLET | Freq: Every day | ORAL | Status: DC

## 2014-10-13 MED ORDER — LITHIUM CARBONATE ER 450 MG PO TBCR: 450.0000 mg | EXTENDED_RELEASE_TABLET | Freq: Two times a day (BID) | ORAL | Status: DC

## 2014-10-13 MED ORDER — HYDROXYZINE HCL 25 MG PO TABS: 25.0000 mg | ORAL_TABLET | Freq: Three times a day (TID) | ORAL | Status: DC

## 2014-10-13 NOTE — Tx Team (Signed)
Interdisciplinary Treatment Plan Update (Adult)  Date:  10/13/2014  Time Reviewed:  9:47 AM   Progress in Treatment: Attending groups: Patient is attending groups. Participating in groups:  Patient engages in discussion Taking medication as prescribed:  Patient is taking medications Tolerating medication:  Patient is tolerating medications Family/Significant othe contact made: Yes, collateral contact with husband. Patient understands diagnosis:Yes, patient understands diagnosis and need for treatment Discussing patient identified problems/goals with staff:  Yes, patient is able to express goals/problems Medical problems stabilized or resolved:  Yes Denies suicidal/homicidal ideation: Yes, patient is denying SI/HI. Issues/concerns per patient self-inventory:   Other:  Discharge Plan or Barriers:  Home with outpatient follow up with [facility]  Reason for Continuation of Hospitalization:  Comments:  Continue medication stabilization  Additional comments:  Patient and CSW reviewed Patient Discharge Process Letter/Patient Involvement Form.  Patient verbalized understanding and signed form.  Patient and CSW also reviewed and identified patient's goals and treatment plan.  Patient verbalized understanding and agreed to plan.  Estimated length of stay: Discharge today  New goal(s):  Review of initial/current patient goals per problem list:  Please see plan of careInterdisciplinary Treatment Plan Update (Adult)  Attendees: Patient 10/13/2014 9:47 AM   Family:   10/13/2014 9:47 AM   Physician:  Neita Garnet, MD 10/13/2014 9:47 AM   Nursing:   Cleotis Nipper, RN 10/13/2014 9:47 AM   Clinical Social Worker:  Joette Catching, LCSW 10/13/2014 9:47 AM   Clinical Social Worker:  Erasmo Downer Drinkard, LCSW-A 10/13/2014 9:47 AM   Case Manager:  Brock Ra, RN UR 10/13/2014 9:47 AM   Other:  Grayland Ormond, RN 10/13/2014 9:47 AM  Other:  Jake Bathe Transition Team 10/13/2014  9:47 AM   Other:   10/13/2014 9:47 AM   Other:  10/13/2014 9:47 AM   Other:  10/13/2014 9:47 AM   Other:  Jake Bathe Transition Team Coordinator 10/13/2014 9:47 AM   Other:   10/13/2014 9:47 AM   Other:  10/13/2014 9:47 AM   Other:   10/13/2014 9:47 AM    Scribe for Treatment Team:   Concha Pyo, 10/13/2014   9:47 AM

## 2014-10-13 NOTE — BHH Suicide Risk Assessment (Signed)
[facility] Discharge Suicide Risk Assessment   Demographic Factors:  49 year old married female, lives with husband , has two adult daughters, is employed   Total Time spent with patient: 30 minutes  Musculoskeletal: Strength & Muscle Tone: within normal limits Gait & Station: normal Patient leans: N/A  Psychiatric Specialty Exam: Physical Exam  ROS  Blood pressure 107/69, pulse 88, temperature 98.1 F (36.7 C), temperature source Oral, resp. rate 16, height 5\' 10"  (1.778 m), weight 202 lb (91.627 kg).Body mass index is 28.98 kg/(m^2).  General Appearance: improved grooming   Eye Contact::  Good  Speech:  Normal Rate409  Volume:  Normal  Mood:  improved mood , improved affect   Affect:  more reactive , smiles at times appropriately  Thought Process:  Goal Directed and Linear  Orientation:  Full (Time, Place, and Person)  Thought Content:  denies hallucinations, no delusions  Suicidal Thoughts:  No- at this time denies any suicidal ideations, denies any self injurious ideations  Homicidal Thoughts:  No  Memory:  recent and remote grossly intact   Judgement:  Other:  improved   Insight:  present  Psychomotor Activity:  Normal  Concentration:  Good  Recall:  Good  Fund of Knowledge:Good  Language: Good  Akathisia:  Negative  Handed:  Right  AIMS (if indicated):     Assets:  Communication Skills Desire for Improvement Housing Resilience Social Support Vocational/Educational  Sleep:  Number of Hours: 6.75  Cognition: WNL  ADL's:  Improved    Have you used any form of tobacco in the last 30 days? (Cigarettes, Smokeless Tobacco, Cigars, and/or Pipes): Yes -does not smoke  Has this patient used any form of tobacco in the last 30 days? (Cigarettes, Smokeless Tobacco, Cigars, and/or Pipes) No  Mental Status Per Nursing Assessment::   On Admission:     Current Mental Status by Physician: Currently patient is improved compared to her admission- she presents with an improved  mood, a fuller range of affect, no thought disorder, she is not suicidal and has no current self injurious ideations, no HI, no hallucinations, no delusions and is future oriented.   Loss Factors: Home and work related stressors   Historical Factors: History of Depression, Anxiety, history of prior psychiatric admissions for depression, no suicide attempts   Risk Reduction Factors:   Sense of responsibility to family, Religious beliefs about death, Employed, Living with another person, especially a relative, Positive social support and Positive coping skills or problem solving skills  Continued Clinical Symptoms:  As noted, at this time improved compared to admssion  Cognitive Features That Contribute To Risk:  No gross cognitive deficits noted upon discharge. Is alert , attentive, and oriented x 3   Suicide Risk:  Mild:  Suicidal ideation of limited frequency, intensity, duration, and specificity.  There are no identifiable plans, no associated intent, mild dysphoria and related symptoms, good self-control (both objective and subjective assessment), few other risk factors, and identifiable protective factors, including available and accessible social support.  Principal Problem: Severe recurrent major depression without psychotic features Discharge Diagnoses:  Patient Active Problem List   Diagnosis Date Noted  . MDD (major depressive disorder), recurrent severe, without psychosis [F33.2]   . Suicidal ideation [R45.851] 10/05/2014  . Depression [F32.9] 06/12/2014  . Rash [R21] 01/04/2014  . Normocytic anemia [D64.9] 01/04/2014  . DJD (degenerative joint disease) [M19.90] 01/04/2014  . Hypertension [I10] 01/04/2014  . Dyslipidemia [E78.5] 01/04/2014  . GERD (gastroesophageal reflux disease) [K21.9] 01/04/2014  .  Obstructive sleep apnea [G47.33] 01/04/2014  . 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 Abdominal abscess [K65.1] 01/01/2014  . Wound infection after  surgery [T81.4XXA] 01/01/2014  . PTSD (post-traumatic stress disorder) [F43.10] 11/23/2013  . Severe recurrent major depression without psychotic features [F33.2] 11/22/2013  . 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 Morbid obesity, Weight - 312, BMI - 45.2 [E66.01] 04/21/2012    Follow-up Information    Follow up with [name] On 10/18/2014.   Why:  Wednesday, October 18, 2014 at [facility] information:   [address] [place], [place]   [address]  [phone]      [facility] with [facility] On 10/30/2014.   Why:  Monday, October 30, 2014 at [facility] information:   [address] [place], [place]   [address]  [phone]      Plan Of Care/Follow-up recommendations:  Activity:  as tolerated Diet:  Regular Tests:  NA Other:  see below  Is patient on multiple antipsychotic therapies at discharge:  No   Has Patient had three or more failed trials of antipsychotic monotherapy by history:  No  Recommended Plan for Multiple Antipsychotic Therapies: NA   Patient is leaving in good spirits. Plans to return home. Follow  Up as above. Patient has established PCP for medical management as needed ( Dr. Allayne Gitelman)  At patient's request , filled out Short Term Disability to the best of my ability      COBOS, [place] 10/13/2014, 2:02 PM

## 2014-10-13 NOTE — Progress Notes (Signed)
Nursing Progress Note: 7-7p  D- Mood is depressed and anxious,rates anxiety at 5/10. Affect is blunted and appropriate. Pt is able to contract for safety. Reports sleep has improved and yesterday was stressful having frequent panic attacks. Pt states she was able to use her coping skills and work thru it.  A - Observed pt interacting in group and in the milieu.Support and encouragement offered, safety maintained with q 15 minutes. Pt states, she feels relieved knowing her dx is Bipolar. " That would definitely explain my behavior"  R-Contracts for safety and continues to follow treatment plan, working on learning new coping skills.

## 2014-10-13 NOTE — BHH Group Notes (Signed)
[facility] LCSW [facility] Discharge Planning Group Note   10/13/2014 11:09 AM    Participation Quality:  Appropraite  Mood/Affect:  Appropriate  Depression Rating:  6  Anxiety Rating:  6  Thoughts of Suicide:  No  Will you contract for safety?   NA  Current AVH:  No  Plan for Discharge/Comments:  Patient attended discharge planning group and actively participated in group. She reports being better and hopes to discharge home today.  She will follow up with [facility]. Suicide prevention education reviewed and SPE document provided.   Transportation Means: Patient has transportation.   Supports:  Patient has a support system.   Omid Deardorff, Eulas Post

## 2014-10-13 NOTE — BHH Group Notes (Addendum)
[place] LCSW Group Therapy 10/13/2014 1:15 PM Type of Therapy: Group Therapy Participation Level: Active  Participation Quality: Attentive  Affect: Appropriate  Cognitive: Alert and Oriented  Insight: Developing/Improving and Engaged  Engagement in Therapy: Developing/Improving and Engaged  Modes of Intervention: Clarification, Confrontation, Discussion, Education, Exploration, Limit-setting, Orientation, Problem-solving, Rapport Building, Art therapist, Socialization and Support  Summary of Progress/Problems: The topic for today was feelings about relapse. Pt discussed what relapse prevention is to them and identified triggers that they are on the path to relapse. Pt processed their feeling towards relapse and was able to relate to peers. Pt discussed coping skills that can be used for relapse prevention. Patient was observed actively listening during discussion but did not participate despite encouragement. Patient left group to speak with MD.   Tilden Fossa, MSW, [place] Worker [facility] [phone]

## 2014-10-13 NOTE — Progress Notes (Signed)
Recreation Therapy Notes  Date: 07.01.16 Time: 9:30 am Location: [address] Group Room  Group Topic: Stress Management  Goal Area(s) Addresses:  Patient will verbalize importance of using healthy stress management.  Patient will identify positive emotions associated with healthy stress management.   Behavioral Response: Engaged  Intervention: Stress Management  Activity :  Guided Automotive engineer.  LRT introduced and educated patients on stress management technique of guided imagery.  A script was used to deliver the technique to patients.  Patients were asked to follow script read aloud by LRT to engage in practicing the stress management technique.  Education:  Stress Management, Discharge Planning.   Education Outcome: Acknowledges edcuation/In group clarification offered/Needs additional education  Clinical Observations/Feedback: Patient stated she enjoyed the activity.  Patient stated she needed this activity yesterday because she had a really bad episode.  Patient expressed the activity helped her.  LRT encouraged patient to take a few minutes during the day when she starts to feel overwhelmed and take deep breaths and visualize her favorite place.   Victorino Sparrow, LRT/CTRS   Ria Comment, Anis Cinelli A 10/13/2014 1:22 PM
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
## 2014-10-13 NOTE — Progress Notes (Signed)
  [facility] Adult Case Management Discharge Plan :  Will you be returning to the same living situation after discharge:  Yes,  Patient is returning to home with family. At discharge, do you have transportation home?: Yes,  Patient has transportation home. Do you have the ability to pay for your medications: Yes,  Patient is able to obtain medications.  Release of information consent forms completed and in the chart;  Patient's signature needed at discharge.  Patient to Follow up at: Follow-up Information    Follow up with [facility] On 10/18/2014.   Why:  Wednesday, October 18, 2014 at [facility] information:   [address] [place], [place]   [address]  [phone]      Follow up with [facility] On 10/30/2014.   Why:  Monday, October 30, 2014 at [facility] information:   [address] [place], [place]   [address]  [phone]       .Reviewed with all patients during discharge planning group   Have you used any form of tobacco in the last 30 days? (Cigarettes, Smokeless Tobacco, Cigars, and/or Pipes): Yes  Has patient been referred to the Quitline?: Patient declined referral to Quitline.   Concha Pyo 10/13/2014, 10:34 AM

## 2014-10-13 NOTE — Progress Notes (Signed)
[facility] Discharge note:  D:  Pt. verbalizes readiness for discharge and denies SI/HI.   A: Discharge instructions reviewed with patient and family, belongings returned, prescriptions and samples given..    R: Pt. And family verbalize understanding of d/c instructions and state their intent to be compliant with them.  Pt discharged to caregiver without incident.  Prudencio Pair, RN

## 2014-10-18 ENCOUNTER — Ambulatory Visit (INDEPENDENT_AMBULATORY_CARE_PROVIDER_SITE_OTHER): Payer: 59 | Admitting: Clinical

## 2014-10-18 ENCOUNTER — Encounter (HOSPITAL_COMMUNITY): Payer: Self-pay | Admitting: Clinical

## 2014-10-18 DIAGNOSIS — F331 Major depressive disorder, recurrent, moderate: Secondary | ICD-10-CM | POA: Diagnosis not present

## 2014-10-18 DIAGNOSIS — F4310 Post-traumatic stress disorder, unspecified: Secondary | ICD-10-CM | POA: Diagnosis not present

## 2014-10-18 NOTE — Progress Notes (Signed)
   THERAPIST PROGRESS NOTE  Session Time: 7:03 - 8:02  Participation Level: Active  Behavioral Response: Casual and NeatAlertDepressed  Type of Therapy: Individual Therapy  Treatment Goals addressed: improve psychiatric symptoms, Emotional Regulation Skills, Healthy Coping Skills, Improve Thoughts and Beliefs   Interventions: CBT and Motivational Interviewing, Grounding and Mindfulness Techniques  Summary: Brittany [name] is a 49 y.o female who presents with   PTSD and Major depressive disorder, recurrent episode, moderate  Suicidal/Homicidal: No -without intent/plan  Therapist Response:  Brittany [name] met with clinician for an individual session. Brittany [name] discussed her psychiatric symptoms, her current life events, and her homework. Brittany [name] shared that she had not completed her homework because she had a "melt down" at work and ended up returning to inpatient for about a week. She shared that she had become very depressed and had tried to numb her emotions and quite the nosie in her hed with medication and alcohol. She shared she did not want to kill herself at that time but that she had suicidal thoughts after. She shared it was really difficult to ask for help again as she felt guilty for not "being okay." Client and clinician discussed her experience. She shared that her medication was adjusted and that she was hopeful that this would help. She shared that she felt good about her experience in inpatient. Client and clinician discussed the gains she has had as well as the ways she has overall been coping in healthier ways. Client and clinician discussed how she frames her experience can make a difference in thoughts and emotions. Brittany [name] and clinician discussed healthy coping skills and how she could apply them. Brittany [name] shared that she had used coloring as a grounding technique. Client and clinician discussed the technique and her insight. She shared that this past week she had learned more about her families mental  health history which made her feel a little better. Brittany [name] agreed to complete her homework until next session.  Plan: Return again in 1 weeks.  Diagnosis: Axis I: PTSD and Major depressive disorder, recurrent episode, moderate   Lorena Clearman A, LCSW 10/18/2014

## 2014-10-29 IMAGING — CT CT ABD-PELV W/ CM
1 of 3 series · 13 of 32 positions shown, 18 images · IV contrast (OMNIPAQUE 300)
Comparison: None.

CLINICAL DATA: Recent small bowel obstruction now with right-sided
pain at the incision site ; elevated white blood cell count.

EXAM:
CT ABDOMEN AND PELVIS WITH CONTRAST
TECHNIQUE: Multidetector CT imaging of the abdomen and pelvis was performed
using the standard protocol following bolus administration of
intravenous contrast.
CONTRAST:  100mL OMNIPAQUE IOHEXOL 300 MG/ML  SOLN

[Series 2: abd/pel with · axial · 0.90mm/px · z∈[-474,-74]mm · 13 of 92 slices shown, 18 images]
[im 6/92  soft-tissue]
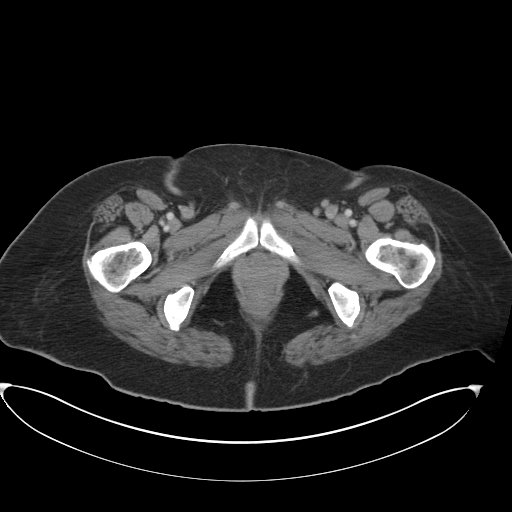
[im 6/92  bone]
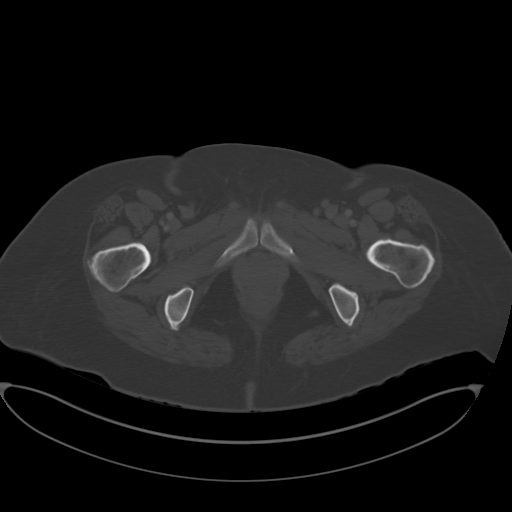
[im 17/92  soft-tissue]
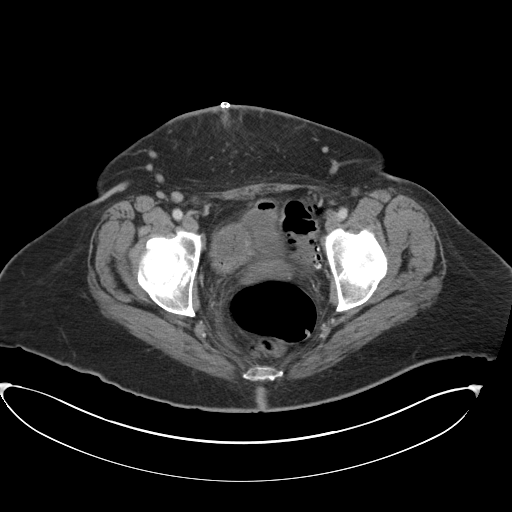
[im 22/92  soft-tissue]
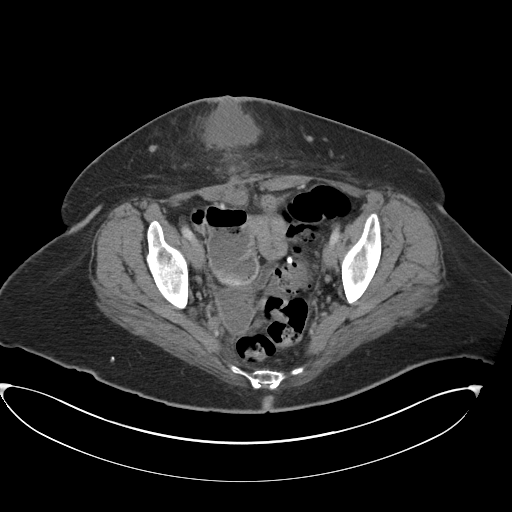
[im 27/92  soft-tissue]
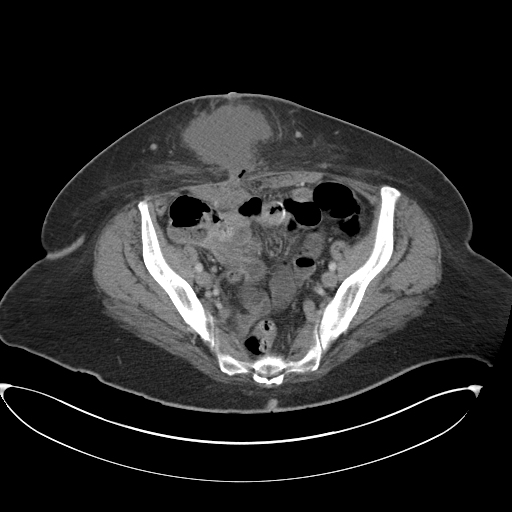
[im 38/92  soft-tissue]
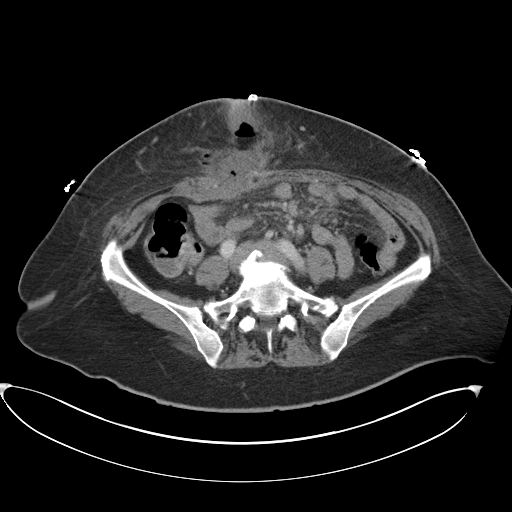
[im 43/92  soft-tissue]
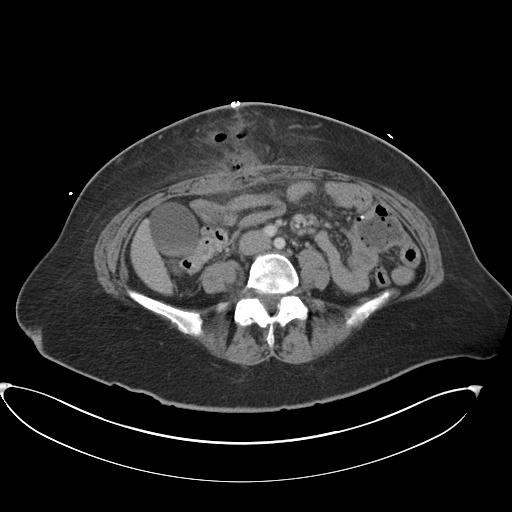
[im 49/92  soft-tissue]
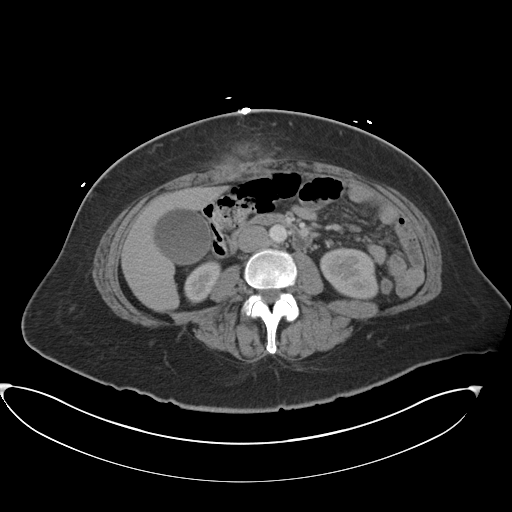
[im 59/92  soft-tissue]
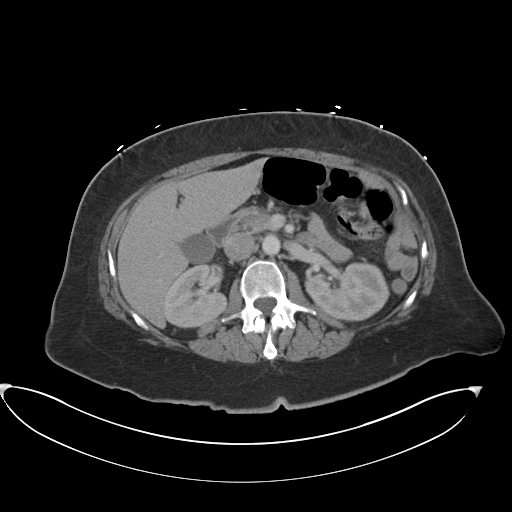
[im 65/92  soft-tissue]
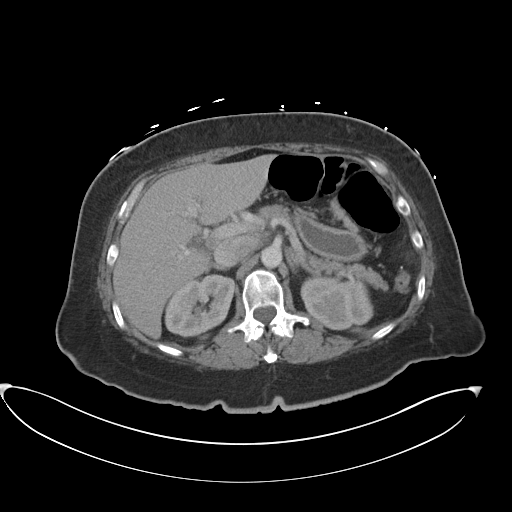
[im 65/92  bone]
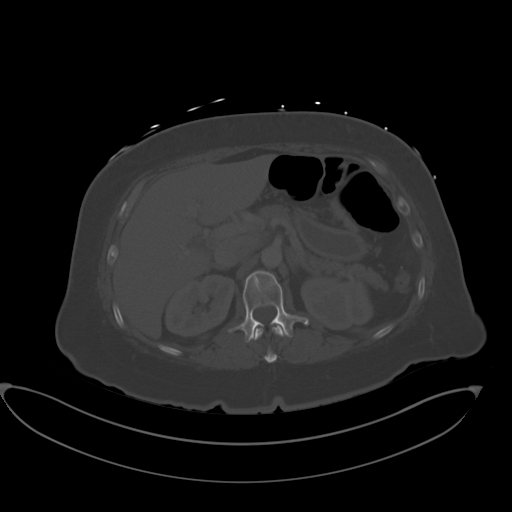
[im 70/92  soft-tissue]
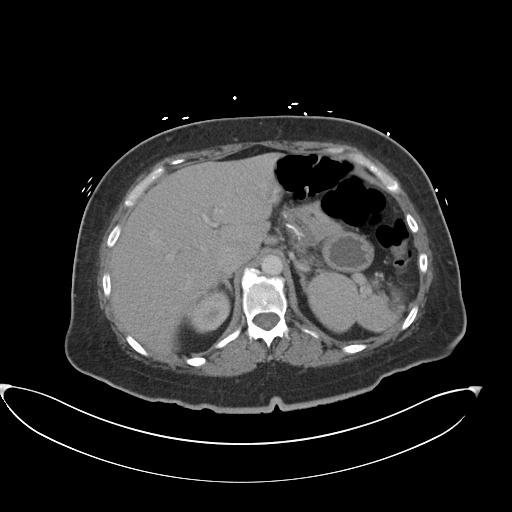
[im 70/92  lung]
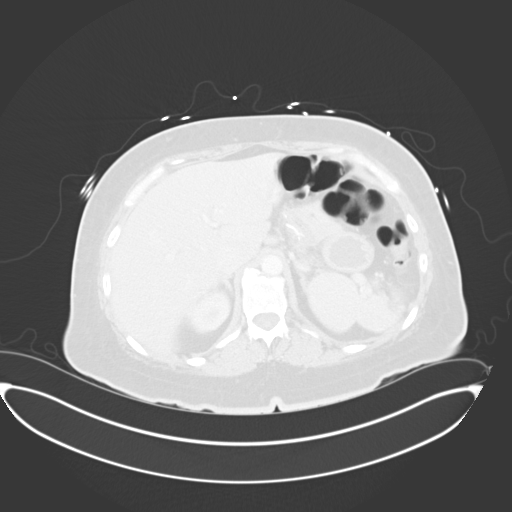
[im 75/92  lung]
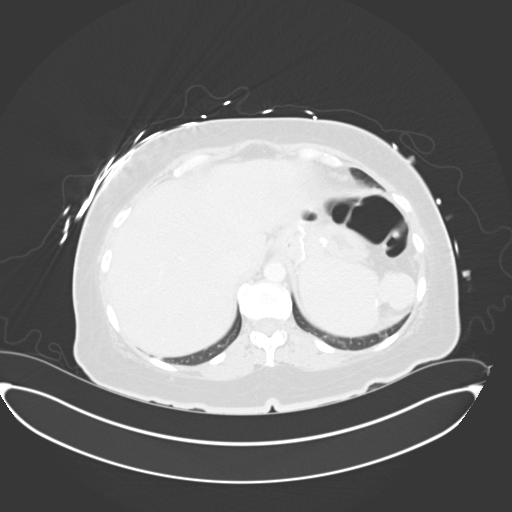
[im 81/92  soft-tissue]
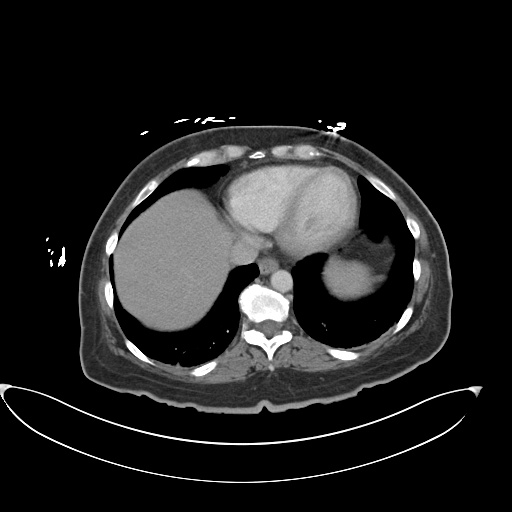
[im 81/92  lung]
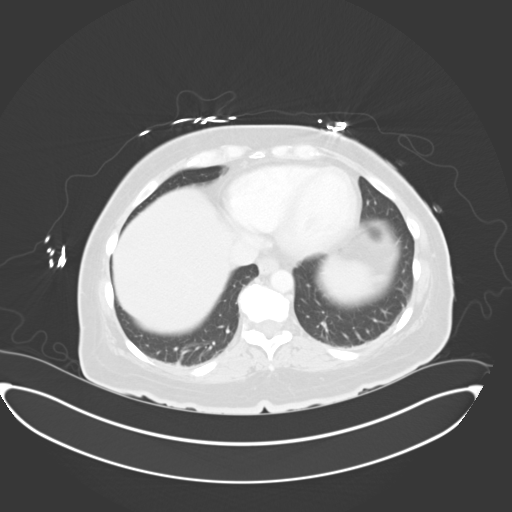
[im 86/92  soft-tissue]
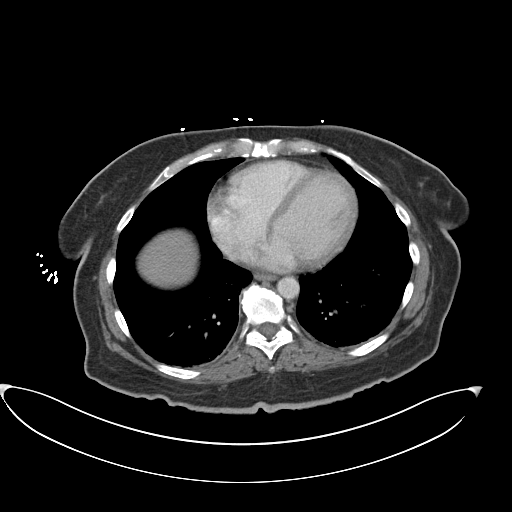
[im 86/92  lung]
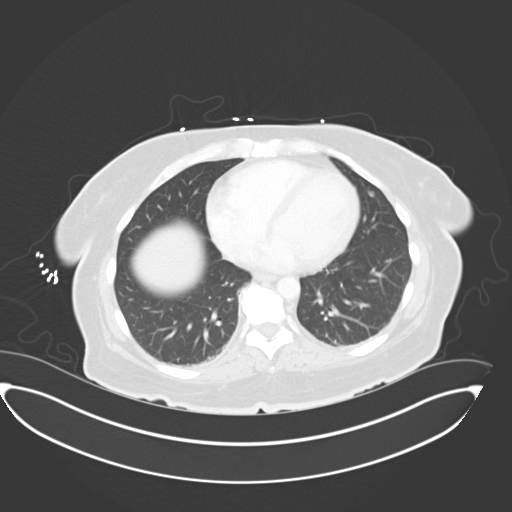

[13 of 32 positions shown; findings below may reference images not displayed]

FINDINGS: There is fluid and gas within the lower anterior abdominal and
pelvic incision site. The is 7.5 cm AP x 8 cm transversely by
cm longitudinally. There is thickening of the inferior aspect of the
right rectus muscle adjacent to the deep portion of this incisional
abscess. No definite loop of involved bowel is identified. The
surgical suture material within small bowel in the mid pelvis is at
some distance from the inflammatory changes. The small bowel and
large bowel gas pattern is normal. No free extraluminal gas
collections within the peritoneal cavity are demonstrated. There is
fluid in the right perirectal region which may reflect a mildly
distended small bowel loop or could reflect an extraluminal
collection. It contains no gas. It measures 4.5 x 3.1 x 4.5 cm.

The liver, gallbladder, pancreas, adrenal glands, and kidneys are
unremarkable. The spleen is lobulated and there are adjacent
surgical clips which may be reflective of previous splenectomy.
There is at least 1 tiny accessory spleen present. Postsurgical
changes from the patient's previous gastric bypass are noted. The
caliber of the abdominal aorta is normal. The periaortic and
pericaval regions are normal.

The lumbar vertebral bodies are preserved in height. There is disc
space narrowing the bony pelvis is unremarkable. The lung bases are
clear.
IMPRESSION: 1. There is an incisional abscess in the anterior abdominal wall
containing fluid and gas. It extends from the peritoneal cavity to
the skin surface. There is mild edema within the adjacent rectus
muscle inferiorly. A smaller fluid collection versus an unopacified
mildly dilated small bowel loop is noted in the right perirectal
region.
2. The small and large bowel gas pattern is unremarkable.
3. No acute visceral abnormality is demonstrated elsewhere.

## 2014-10-30 ENCOUNTER — Ambulatory Visit (INDEPENDENT_AMBULATORY_CARE_PROVIDER_SITE_OTHER): Payer: 59 | Admitting: Psychiatry

## 2014-10-30 ENCOUNTER — Encounter (HOSPITAL_COMMUNITY): Payer: Self-pay | Admitting: Psychiatry

## 2014-10-30 ENCOUNTER — Inpatient Hospital Stay (HOSPITAL_COMMUNITY)
Admission: AD | Admit: 2014-10-30 | Discharge: 2014-11-02 | DRG: 885 | Disposition: A | Discharge status: Other Acute Inpatient Hospital | Payer: 59 | Attending: Psychiatry | Admitting: Psychiatry

## 2014-10-30 ENCOUNTER — Encounter (HOSPITAL_COMMUNITY): Payer: Self-pay | Admitting: *Deleted

## 2014-10-30 VITALS — BP 104/74 | HR 68 | Ht 70.0 in | Wt 201.4 lb

## 2014-10-30 DIAGNOSIS — F329 Major depressive disorder, single episode, unspecified: Secondary | ICD-10-CM | POA: Diagnosis present

## 2014-10-30 DIAGNOSIS — F339 Major depressive disorder, recurrent, unspecified: Secondary | ICD-10-CM

## 2014-10-30 DIAGNOSIS — Z87891 Personal history of nicotine dependence: Secondary | ICD-10-CM | POA: Diagnosis not present

## 2014-10-30 DIAGNOSIS — R45851 Suicidal ideations: Secondary | ICD-10-CM | POA: Diagnosis present

## 2014-10-30 DIAGNOSIS — F4310 Post-traumatic stress disorder, unspecified: Secondary | ICD-10-CM | POA: Diagnosis not present

## 2014-10-30 DIAGNOSIS — F332 Major depressive disorder, recurrent severe without psychotic features: Principal | ICD-10-CM

## 2014-10-30 DIAGNOSIS — F331 Major depressive disorder, recurrent, moderate: Secondary | ICD-10-CM

## 2014-10-30 MED ORDER — ADULT MULTIVITAMIN W/MINERALS CH
1.0000 | ORAL_TABLET | Freq: Every day | ORAL | Status: DC
  Administered 2014-10-30 – 2014-11-02 (×4): 1 via ORAL
  Filled 2014-10-30 (×7): qty 1

## 2014-10-30 MED ORDER — LORATADINE 10 MG PO TABS
10.0000 mg | ORAL_TABLET | Freq: Every day | ORAL | Status: DC
  Administered 2014-10-30 – 2014-11-02 (×4): 10 mg via ORAL
  Filled 2014-10-30 (×7): qty 1

## 2014-10-30 MED ORDER — HYDROXYZINE HCL 25 MG PO TABS
25.0000 mg | ORAL_TABLET | Freq: Three times a day (TID) | ORAL | Status: DC
  Administered 2014-10-30 – 2014-11-02 (×10): 25 mg via ORAL
  Filled 2014-10-30 (×17): qty 1

## 2014-10-30 MED ORDER — ALUM & MAG HYDROXIDE-SIMETH 200-200-20 MG/5ML PO SUSP: 30.0000 mL | ORAL | Status: DC | PRN

## 2014-10-30 MED ORDER — ACETAMINOPHEN 325 MG PO TABS
650.0000 mg | ORAL_TABLET | Freq: Four times a day (QID) | ORAL | Status: DC | PRN
  Administered 2014-11-02: 650 mg via ORAL
  Filled 2014-10-30: qty 2

## 2014-10-30 MED ORDER — NICOTINE 21 MG/24HR TD PT24
21.0000 mg | MEDICATED_PATCH | Freq: Every day | TRANSDERMAL | Status: DC
  Filled 2014-10-30: qty 1

## 2014-10-30 MED ORDER — VITAMIN B-12 1000 MCG PO TABS
1500.0000 ug | ORAL_TABLET | Freq: Every day | ORAL | Status: DC
  Administered 2014-10-31 – 2014-11-02 (×3): 1500 ug via ORAL
  Filled 2014-10-30 (×5): qty 1.5

## 2014-10-30 MED ORDER — VITAMIN D3 25 MCG (1000 UNIT) PO TABS
1000.0000 [IU] | ORAL_TABLET | Freq: Every day | ORAL | Status: DC
  Administered 2014-10-31 – 2014-11-02 (×3): 1000 [IU] via ORAL
  Filled 2014-10-30 (×6): qty 1

## 2014-10-30 MED ORDER — CALCIUM CITRATE 950 (200 CA) MG PO TABS
200.0000 mg | ORAL_TABLET | Freq: Every day | ORAL | Status: DC
  Filled 2014-10-30 (×2): qty 1

## 2014-10-30 MED ORDER — MAGNESIUM HYDROXIDE 400 MG/5ML PO SUSP: 30.0000 mL | Freq: Every day | ORAL | Status: DC | PRN

## 2014-10-30 MED ORDER — TRAZODONE HCL 50 MG PO TABS
50.0000 mg | ORAL_TABLET | Freq: Every evening | ORAL | Status: DC | PRN
  Administered 2014-10-30: 50 mg via ORAL
  Filled 2014-10-30: qty 1

## 2014-10-30 NOTE — Progress Notes (Signed)
Nursing admission note:  Patient is a walk in from outpatient clinic.  She currently is a patient of Dr. Adele Schilder.  She states she was at an appointment today and indicated that she is having suicidal thoughts.  Patient was discharged from [facility] on July 1.  Patient is tearful with depressed mood.  She states since she has been discharged, the thoughts have gotten increasingly worse.  She has thoughts of overdosing on her medications or running her car "under a 18-wheeler, but I wouldn't do that because I may hurt the driver."  She is having difficulty sleeping, expressing SI and hopelessness. She denies any HI/AVH.  Her main support system is her husband.  She denies any alcohol or drug use.  She has not pertinent medical issues.  Patient will contract for safety on the unit.  Patient states she is out on leave of absence from her job.  Patient was reoriented to room and unit.
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
## 2014-10-30 NOTE — Progress Notes (Signed)
[place] Group Notes:  (Nursing/MHT/Case Management/Adjunct)  Date:  10/30/2014  Time:  9:15 PM  Type of Therapy:  Psychoeducational Skills  Participation Level:  Active  Participation Quality:  Appropriate and Attentive  Affect:  Appropriate and Excited  Cognitive:  Appropriate  Insight:  Appropriate and Good  Engagement in Group:  Engaged  Modes of Intervention:  Activity  Summary of Progress/Problems: Pts played a therapeutic activity of [employer]. Pt was engaged and actively participated.  Clint Bolder 10/30/2014, 9:15 PM

## 2014-10-30 NOTE — Progress Notes (Signed)
D: Pt presents flat in affect and depressed in mood. Pt reports that her current medication regimen is not effective in controlling her depression and frequent suicidal thoughts. Pt discusses her visit with Dr. Adele Schilder that brought upon her inpatient here. Pt reports that she feels safe here and that she is able to contract for safety. Pt is current compliant with her current POC.  A: Writer administered trazodone to aid with pt's insomnia. Continued support and availability as needed was extended to this pt. Staff continue to monitor pt with q26min checks.  R: No adverse drug reactions noted. Pt receptive to treatment. Pt remains safe at this time.
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
## 2014-10-31 DIAGNOSIS — F332 Major depressive disorder, recurrent severe without psychotic features: Principal | ICD-10-CM

## 2014-10-31 DIAGNOSIS — R45851 Suicidal ideations: Secondary | ICD-10-CM

## 2014-10-31 MED ORDER — QUETIAPINE FUMARATE 100 MG PO TABS
100.0000 mg | ORAL_TABLET | Freq: Every day | ORAL | Status: DC
  Administered 2014-10-31 – 2014-11-01 (×2): 100 mg via ORAL
  Filled 2014-10-31 (×5): qty 1

## 2014-10-31 MED ORDER — CLONAZEPAM 1 MG PO TABS
1.0000 mg | ORAL_TABLET | Freq: Every day | ORAL | Status: DC
  Administered 2014-10-31 – 2014-11-02 (×3): 1 mg via ORAL
  Filled 2014-10-31 (×3): qty 1

## 2014-10-31 MED ORDER — CALCIUM CARBONATE ANTACID 500 MG PO CHEW
1.0000 | CHEWABLE_TABLET | Freq: Every day | ORAL | Status: DC
  Administered 2014-10-31 – 2014-11-02 (×3): 200 mg via ORAL
  Filled 2014-10-31 (×5): qty 1

## 2014-10-31 NOTE — [facility] Group Notes (Signed)
[place] Group Notes:  (Nursing/MHT/Case Management/Adjunct)  Date:  10/31/2014  Time: 0900 am  Type of Therapy:  Psychoeducational Skills  Participation Level:  Active  Participation Quality:  Appropriate and Attentive  Affect:  Appropriate  Cognitive:  Alert and Appropriate  Insight:  Good  Engagement in Group:  Engaged  Modes of Intervention:  Support  Summary of Progress/Problems: Patient was engaged and attentive during group. She is participating in her treatment.  [name] 10/31/2014, 9:52 AM
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
## 2014-10-31 NOTE — Progress Notes (Signed)
D: Patient continues to express suicidal thoughts.  She does contract for safety on the unit.  Patient states she feels suicidal "almost all of the time."  She rates her depression as a 9; hopelessness as a 10; anxiety as a 10.  She is not sleeping well and her concentration is poor.  Her goal today is to "try to remain clam.  Try to control my agitation and anxiety.  Try to not commit suicide."  Patient is interested in ECT treatments.  She denies HI/AVH.  She is interacting well with staff and peers.  Her affect is flat, blunted; her mood is depressed and sad.   A: Continue to monitor medication management and MD orders.  Safety checks completed every 15 minutes per protocol.  Offer support and encouragement as needed. R: Patient is programming on the 400 hall due to absence of substance abuse.

## 2014-10-31 NOTE — [facility] Suicide Risk Assessment (Signed)
[place] INPATIENT:  Family/Significant Other Suicide Prevention Education  Suicide Prevention Education:  Education Completed; Husband Iliana Hutt [phone],  (name of family member/significant other) has been identified by the patient as the family member/significant other with whom the patient will be residing, and identified as the person(s) who will aid the patient in the event of a mental health crisis (suicidal ideations/suicide attempt).  With written consent from the patient, the family member/significant other has been provided the following suicide prevention education, prior to the and/or following the discharge of the patient.  The suicide prevention education provided includes the following:  Suicide risk factors  Suicide prevention and interventions  National Suicide Hotline telephone number  [facility] assessment telephone number  [facility] Emergency Assistance [place] and/or Residential Mobile Crisis Unit telephone number  Request made of family/significant other to:  Remove weapons (e.g., guns, rifles, knives), all items previously/currently identified as safety concern.    Remove drugs/medications (over-the-counter, prescriptions, illicit drugs), all items previously/currently identified as a safety concern.  The family member/significant other verbalizes understanding of the suicide prevention education information provided.  The family member/significant other agrees to remove the items of safety concern listed above.  Ajit Errico, Casimiro Needle 10/31/2014, 12:27 PM

## 2014-10-31 NOTE — Tx Team (Signed)
Initial Interdisciplinary Treatment Plan   PATIENT STRESSORS: Health problems Medication change or noncompliance   PATIENT STRENGTHS: Average or above average intelligence Supportive family/friends Work skills   PROBLEM LIST: Problem List/Patient Goals Date to be addressed Date deferred Reason deferred Estimated date of resolution  Reoccurring depression 10/31/2014     Suicidal thoughts 10/31/2014     "Medications are not working" 11/10/2014                                          DISCHARGE CRITERIA:  Ability to meet basic life and health needs Need for constant or close observation no longer present Reduction of life-threatening or endangering symptoms to within safe limits Safe-care adequate arrangements made Verbal commitment to aftercare and medication compliance  PRELIMINARY DISCHARGE PLAN: Outpatient therapy Return to previous living arrangement Return to previous work or school arrangements  PATIENT/FAMIILY INVOLVEMENT: This treatment plan has been presented to and reviewed with the patient, Brittany [name].  The patient and family have been given the opportunity to ask questions and make suggestions.  Brittany [name] 10/31/2014, 6:34 PM

## 2014-10-31 NOTE — [facility] Suicide Risk Assessment (Signed)
[facility] Admission Suicide Risk Assessment   Nursing information obtained from:    Demographic factors:    Current Mental Status:    Loss Factors:    Historical Factors:    Risk Reduction Factors:    Total Time spent with patient: 45 minutes Principal Problem: <principal problem not specified> Diagnosis:   Patient Active Problem List   Diagnosis Date Noted  . MDD (major depressive disorder) [F32.2] 10/30/2014  . MDD (major depressive disorder), recurrent severe, without psychosis [F33.2]   . Suicidal ideation [R45.851] 10/05/2014  . Depression [F32.9] 06/12/2014  . Rash [R21] 01/04/2014  . Normocytic anemia [D64.9] 01/04/2014  . DJD (degenerative joint disease) [M19.90] 01/04/2014  . Hypertension [I10] 01/04/2014  . Dyslipidemia [E78.5] 01/04/2014  . GERD (gastroesophageal reflux disease) [K21.9] 01/04/2014  . Obstructive sleep apnea [G47.33] 01/04/2014  . 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 Abdominal abscess [K65.1] 01/01/2014  . Wound infection after surgery [T81.4XXA] 01/01/2014  . PTSD (post-traumatic stress disorder) [F43.10] 11/23/2013  . Severe recurrent major depression without psychotic features [F33.2] 11/22/2013  . 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 Morbid obesity, Weight - 312, BMI - 45.2 [E66.01] 04/21/2012     Continued Clinical Symptoms:  Alcohol Use Disorder Identification Test Final Score (AUDIT): 2 The "Alcohol Use Disorders Identification Test", Guidelines for Use in Primary Care, Second Edition.  World Pharmacologist [facility]). Score between 0-7:  no or low risk or alcohol related problems. Score between 8-15:  moderate risk of alcohol related problems. Score between 16-19:  high risk of alcohol related problems. Score 20 or above:  warrants further diagnostic evaluation for alcohol dependence and treatment.   CLINICAL FACTORS:   Depression:   Severe   Musculoskeletal: Strength & Muscle Tone:  within normal limits Gait & Station: normal Patient leans: normal  Psychiatric Specialty Exam: Physical Exam  ROS  Blood pressure 121/84, pulse 79, temperature 97.4 F (36.3 C), temperature source Oral, resp. rate 16, height 5\' 10"  (1.778 m), weight 91.354 kg (201 lb 6.4 oz).Body mass index is 28.9 kg/(m^2).  COGNITIVE FEATURES THAT CONTRIBUTE TO RISK:  Closed-mindedness, Polarized thinking and Thought constriction (tunnel vision)    SUICIDE RISK:   Moderate:  Frequent suicidal ideation with limited intensity, and duration, some specificity in terms of plans, no associated intent, good self-control, limited dysphoria/symptomatology, some risk factors present, and identifiable protective factors, including available and accessible social support.  PLAN OF CARE: Supportive approach/coping skills                              Major depression; reassess use of psychotropics                              Use CBT/mindfulness                              Consider ECT  Medical Decision Making:  Review of Psycho-Social Stressors (1), Review or order clinical lab tests (1) and Review of Medication Regimen & Side Effects (2)  I certify that inpatient services furnished can reasonably be expected to improve the patient's condition.   Astin Sayre A 10/31/2014, 6:14 PM
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
## 2014-10-31 NOTE — Progress Notes (Signed)
Recreation Therapy Notes  Animal-Assisted Activity (AAA) Program Checklist/Progress Notes Patient Eligibility Criteria Checklist & Daily Group note for Rec Tx Intervention  Date: 07.19.16 Time: 2:45 pm Location: [address]   AAA/T Program Assumption of Risk Form signed by Patient/ or Parent Legal Guardian yes  Patient is free of allergies or sever asthma yes  Patient reports no fear of animals yes  Patient reports no history of cruelty to animalsyes  Patient understands his/her participation is voluntary yes  Patient washes hands before animal contact yes  Patient washes hands after animal contact yes  Education: Hand Washing, Appropriate Animal Interaction   Education Outcome: Acknowledges understanding/In group clarification offered  Clinical Observations/Feedback: Patient did not attend group.   Victorino Sparrow, LRT/CTRS         Victorino Sparrow A 10/31/2014 4:03 PM

## 2014-10-31 NOTE — BHH Group Notes (Signed)
[place] LCSW Group Therapy 10/31/2014 1:15 PM Type of Therapy: Group Therapy Participation Level: Active  Participation Quality: Attentive, Sharing and Supportive  Affect: Depressed and Flat  Cognitive: Alert and Oriented  Insight: Developing/Improving and Engaged  Engagement in Therapy: Developing/Improving and Engaged  Modes of Intervention: Activity, Clarification, Confrontation, Discussion, Education, Exploration, Limit-setting, Orientation, Problem-solving, Rapport Building, Art therapist, Socialization and Support  Summary of Progress/Problems: Patient was attentive and engaged with speaker from [place]. Patient was attentive to speaker while they shared their story of dealing with mental health and overcoming it. Patient expressed interest in their programs and services and received information on their agency. Patient processed ways they can relate to the speaker.   Tilden Fossa, MSW, [place] Worker [facility] [phone]

## 2014-10-31 NOTE — Tx Team (Signed)
Interdisciplinary Treatment Plan Update (Adult) Date: 10/31/2014    Time Reviewed: 9:30 AM  Progress in Treatment: Attending groups: Continuing to assess, patient new to milieu Participating in groups: Continuing to assess, patient new to milieu Taking medication as prescribed: Yes Tolerating medication: Yes Family/Significant other contact made: No, CSW assessing for appropriate contacts Patient understands diagnosis: Yes Discussing patient identified problems/goals with staff: Yes Medical problems stabilized or resolved: Yes Denies suicidal/homicidal ideation: Yes Issues/concerns per patient self-inventory: Yes Other:  New problem(s) identified: N/A  Discharge Plan or Barriers: 10/31/2014:  CSW continuing to assess, patient new to milieu.  Reason for Continuation of Hospitalization:  Depression Anxiety Medication Stabilization   Comments: N/A  Estimated length of stay: 3-5 days  For review of initial/current patient goals, please see plan of care. Patient is a 49 year old Caucasian female admitted for SI. Patient will benefit from crisis stabilization, medication evaluation, group therapy, and psycho education in addition to case management for discharge planning. Patient and CSW reviewed pt's identified goals and treatment plan. Pt verbalized understanding and agreed to treatment plan.   Attendees: Patient:    Family:    Physician: Dr. Parke Poisson; Dr. Sabra Heck 10/31/2014 9:30 AM  Nursing: Franco Nones, Mayra Neer, [place], [place] 10/31/2014 9:30 AM  Clinical Social Worker: Tilden Fossa,  [place] 10/31/2014 9:30 AM  Other: Peri Maris, LCSWA  10/31/2014 9:30 AM  Other: Lucinda Dell, [place] Liaison 10/31/2014 9:30 AM  Other:  10/31/2014 9:30 AM  Other: Ave Filter , NP 10/31/2014 9:30 AM  Other:    Other:       Scribe for Treatment Team:  Tilden Fossa, MSW, [employer] [phone]

## 2014-11-01 MED ORDER — LORAZEPAM 1 MG PO TABS
1.0000 mg | ORAL_TABLET | Freq: Once | ORAL | Status: AC
  Administered 2014-11-01: 1 mg via ORAL

## 2014-11-01 MED ORDER — LORAZEPAM 1 MG PO TABS
ORAL_TABLET | ORAL | Status: AC
  Filled 2014-11-01: qty 1

## 2014-11-01 NOTE — Clinical Social Work Note (Signed)
ECT referral faxed to [facility] for review.  Tilden Fossa, MSW, [place] Worker [facility] [phone]

## 2014-11-01 NOTE — Progress Notes (Signed)
D: Patient tearful and depressed.  She rates her depression and hopelessness as an 8; anxiety as a 10.  She denies SI/HI/AVH today.  Her goal is to "stay grounded.  Stay positive; try to ride out my emotions."  She did not attend SW's group this morning.  Patient is hoping to receive ECT treatment at [facility].  She presents with anxious affect; sad and depressed mood. A: Continue to monitor medication management and MD orders.  Safety checks completed every 15 minutes per protocol.  Offer support and encouragement as needed. R: Patient is pleasant and cooperative.

## 2014-11-01 NOTE — BHH Group Notes (Signed)
[facility] LCSW Aftercare Discharge Planning Group Note  11/01/2014 8:45 AM  Participation Quality: Alert, Appropriate and Oriented  Mood/Affect: Flat  Depression Rating: 8  Anxiety Rating: 10  Thoughts of Suicide: Pt denies SI/HI  Will you contract for safety? Yes  Current AVH: Pt denies  Plan for Discharge/Comments: Pt attended discharge planning group and actively participated in group. CSW discussed suicide prevention education with the group and encouraged them to discuss discharge planning and any relevant barriers. Pt presents with blunted affect and reports that her depression and anxiety are very elevated this morning. Pt was able to articulate discharge plan and follow-up arrangements. No needs expressed at this time.  Transportation Means: Pt reports access to transportation  Supports: No supports mentioned at this time  Peri Maris, [place] 11/01/2014 3:54 PM

## 2014-11-01 NOTE — Progress Notes (Signed)
Recreation Therapy Notes  Date: 07.20.16 Time: 9:30 am Location: 300 Hall Group Room  Group Topic: Stress Management  Goal Area(s) Addresses:  Patient will verbalize importance of using healthy stress management.  Patient will identify positive emotions associated with healthy stress management.   Intervention: Stress Management  Activity :  Progressive Muscle Relaxation.  LRT will introduce and educate patients on the stress management technique of progressive muscle relaxation.  A script was used to present  the technique to the patients.  Patients were asked to follow a long with the script read a loud by the LRT.  Education:  Stress Management, Discharge Planning.   Education Outcome: Acknowledges edcuation/In group clarification offered/Needs additional education  Clinical Observations/Feedback: Patient did not attend group.   Victorino Sparrow, LRT/CTRS         Ria Comment, Cashlyn Huguley A 11/01/2014 10:11 AM

## 2014-11-01 NOTE — Progress Notes (Signed)
D: Pt endorses constant SI. Pt reflects on her daily struggles with depression to Probation officer. Pt remains open to the idea of ECT. Pt attended group on the 400 hall this evening. Pt is currently denying any HI/AVH. Pt is compliant with her current POC.  A: Writer administered scheduled medications to pt, per MD orders. Continued support and availability as needed was extended to this pt. Staff continue to monitor pt with q13min checks.  R: No adverse drug reactions noted. Pt receptive to treatment. Pt remains safe at this time.

## 2014-11-01 NOTE — BHH Group Notes (Deleted)
[facility] LCSW Aftercare Discharge Planning Group Note  11/01/2014  8:45 AM  Participation Quality: Did Not Attend. Patient invited to participate but declined.  Tilden Fossa, MSW, [place] Worker [facility] [phone]

## 2014-11-01 NOTE — Plan of Care (Signed)
Problem: Alteration in mood Goal: LTG-Pt's behavior demonstrates decreased signs of depression 7/20: Goal not met: Pt presents with flat affect and depressed mood. Pt admitted with depression rating of 10. Pt to show decreased sign of depression and a rating of 3 or less before d/c.   Tilden Fossa, MSW, [place] Worker [facility] [phone]     (Patient's behavior demonstrates decreased signs of depression to the point the patient is safe to return home and continue treatment in an outpatient setting)  Outcome: Not Progressing Patient tearful.  Sobbing at times.  She is upset for feeling this way.  Doesn't understand why she cannot "shake it."  Patient endorses passive SI.

## 2014-11-01 NOTE — Progress Notes (Signed)
[place] Group Notes:  (Nursing/MHT/Case Management/Adjunct)  Date:  11/01/2014  Time:  8:55 PM  Type of Therapy:  Psychoeducational Skills  Participation Level:  Active  Participation Quality:  Appropriate  Affect:  Appropriate  Cognitive:  Appropriate  Insight:  Good  Engagement in Group:  Engaged  Modes of Intervention:  Discussion  Summary of Progress/Problems: Tonight in wrap up group Ethel said that her day started off at a 3 she said that her anxiety was the cause of her low day. But she had a visit from her old neighbor, she is the god mother to her neighbors children and dog so being able to speak with her and have someone here that she is familiar with helped to make her day a little better. [name] 11/01/2014, 8:55 PM

## 2014-11-01 NOTE — BHH Group Notes (Signed)
[place] LCSW Group Therapy 11/01/2014 1:15 PM  Type of Therapy: Group Therapy- Emotion Regulation  Participation Level: Active   Participation Quality:  Appropriate  Affect: Flat  Cognitive: Alert and Oriented   Insight:  Developing/Improving  Engagement in Therapy: Developing/Improving and Engaged   Modes of Intervention: Clarification, Confrontation, Discussion, Education, Exploration, Limit-setting, Orientation, Problem-solving, Rapport Building, Art therapist, Socialization and Support  Summary of Progress/Problems: The topic for group today was emotional regulation. This group focused on both positive and negative emotion identification and allowed group members to process ways to identify feelings, regulate negative emotions, and find healthy ways to manage internal/external emotions. Group members were asked to reflect on a time when their reaction to an emotion led to a negative outcome and explored how alternative responses using emotion regulation would have benefited them. Group members were also asked to discuss a time when emotion regulation was utilized when a negative emotion was experienced. Pt expressed to group that her emotions today have been very elevated and that she has not been able to regulate her thoughts; Pt recognizes that her thoughts inform her behaviors but reports that she has been unable to stop her thoughts despite efforts to distract herself. CSW processed with Pt the idea of challenging her negative thoughts with logical evidence as depression is often irrational and produces thoughts that are unfounded. Pt discussed with group the importance of family. Pt was also able to identify strategies to help regulate anger such as taking breaks.   Peri Maris, [place] 11/01/2014 3:55 PM

## 2014-11-01 NOTE — Progress Notes (Signed)
[facility] MD Progress Note  11/01/2014 7:10 PM Brittany [name]  MRN:  [id number] Subjective:  Brittany [name] had an episode not triggered of increased agitation and feeling suicidal. She states that if she was not happened to be here she would probably have tried to hurt herself. States this episode is one of the worst she has had. She did tell the nurse and she was given some ativan that she found effective. She is very worried about this "spells' as there is not a pattern to them Principal Problem: <principal problem not specified> Diagnosis:   Patient Active Problem List   Diagnosis Date Noted  . MDD (major depressive disorder) [F32.2] 10/30/2014  . MDD (major depressive disorder), recurrent severe, without psychosis [F33.2]   . Suicidal ideation [R45.851] 10/05/2014  . Depression [F32.9] 06/12/2014  . Rash [R21] 01/04/2014  . Normocytic anemia [D64.9] 01/04/2014  . DJD (degenerative joint disease) [M19.90] 01/04/2014  . Hypertension [I10] 01/04/2014  . Dyslipidemia [E78.5] 01/04/2014  . GERD (gastroesophageal reflux disease) [K21.9] 01/04/2014  . Obstructive sleep apnea [G47.33] 01/04/2014  . 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 Abdominal abscess [K65.1] 01/01/2014  . Wound infection after surgery [T81.4XXA] 01/01/2014  . PTSD (post-traumatic stress disorder) [F43.10] 11/23/2013  . Severe recurrent major depression without psychotic features [F33.2] 11/22/2013  . 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 Morbid obesity, Weight - 312, BMI - 45.2 [E66.01] 04/21/2012   Total Time spent with patient: 30 minutes   Past Medical History:  Past Medical History  Diagnosis Date  . Migraine   . Hypertension   . Hypercholesterolemia   . Depression   . Morbid obesity   . Sleep apnea     no CPAP machine   . H/O hiatal hernia   . GERD (gastroesophageal reflux disease)     hx of   . Arthritis     hips, knees and hands   . Anxiety   . PTSD  (post-traumatic stress disorder)     Past Surgical History  Procedure Laterality Date  . Foot surgery    . Breath tek h pylori  05/03/2012    Procedure: BREATH TEK H PYLORI;  Surgeon: [name], MD;  Location: [name];  Service: General;  Laterality: N/A;  . Right tube and ovary removed     . Abdominal hysterectomy  1997 & 2006  . Colonoscopy      hx of benign polyps   . 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 Upper gi endoscopy N/A 07/05/2012    Procedure: UPPER GI ENDOSCOPY;  Surgeon: [name], MD;  Location: [facility];  Service: General;  Laterality: N/A;  . Laparoscopic lysis of adhesions N/A 07/05/2012    Procedure: LAPAROSCOPIC LYSIS OF ADHESIONS;  Surgeon: [name], MD;  Location: [facility];  Service: General;  Laterality: N/A;  repair of abdominal wall hernia  . Irrigation and debridement abscess N/A 01/01/2014    Procedure: IRRIGATION AND DEBRIDEMENT ABSCESS;  Surgeon: [name], MD;  Location: [facility];  Service: General;  Laterality: N/A;  . Gastric bypass     Family History:  Family History  Problem Relation Age of Onset  . Cancer Mother     colon  . Cancer Maternal Aunt     breast  . Depression Father   . Depression Sister   .  Post-traumatic stress disorder Sister   . Alcohol abuse Paternal Uncle   . Bipolar disorder Cousin   . Anxiety disorder Sister    Social History:  History  Alcohol Use No    Comment: Occasional use     History  Drug Use No    History   Social History  . Marital Status: Married    Spouse Name: N/A  . Number of Children: N/A  . Years of Education: N/A   Social History Main Topics  . Smoking status: Former Smoker    Types: Cigars    Quit date: 04/27/2004  . Smokeless tobacco: Never Used  . Alcohol Use: No     Comment: Occasional use  . Drug Use: No  . Sexual Activity: Yes   Other  Topics Concern  . None   Social History Narrative   Additional History:    Sleep: Fair  Appetite:  Fair   Assessment:   Musculoskeletal: Strength & Muscle Tone: within normal limits Gait & Station: normal Patient leans: normal   Psychiatric Specialty Exam: Physical Exam  Review of Systems  Constitutional: Negative.   HENT: Negative.   Eyes: Negative.   Respiratory: Negative.   Cardiovascular: Negative.   Gastrointestinal: Negative.   Genitourinary: Negative.   Musculoskeletal: Negative.   Skin: Negative.   Neurological: Negative.   Endo/Heme/Allergies: Negative.   Psychiatric/Behavioral: Positive for depression and suicidal ideas. The patient is nervous/anxious.     Blood pressure 116/81, pulse 88, temperature 98 F (36.7 C), temperature source Oral, resp. rate 18, height 5\' 10"  (1.778 m), weight 91.354 kg (201 lb 6.4 oz).Body mass index is 28.9 kg/(m^2).  General Appearance: Fairly Groomed  Engineer, water::  Fair  Speech:  Clear and Coherent  Volume:  fluctuates  Mood:  Anxious, Depressed and Dysphoric  Affect:  Labile and Tearful  Thought Process:  Coherent and Goal Directed  Orientation:  Full (Time, Place, and Person)  Thought Content:  symptoms events worries concerns  Suicidal Thoughts:  Not right now  Homicidal Thoughts:  No  Memory:  Immediate;   Fair Recent;   Fair Remote;   Fair  Judgement:  Fair  Insight:  Present  Psychomotor Activity:  Restlessness  Concentration:  Fair  Recall:  [employer] of Knowledge:Fair  Language: Fair  Akathisia:  No  Handed:  Right  AIMS (if indicated):     Assets:  Desire for Improvement Housing Social Support Vocational/Educational  ADL's:  Intact  Cognition: WNL  Sleep:  Number of Hours: 6.5     Current Medications: Current Facility-Administered Medications  Medication Dose Route Frequency Provider Last Rate Last Dose  . acetaminophen (TYLENOL) tablet 650 mg  650 mg Oral Q6H PRN Encarnacion Slates, NP      .  alum & mag hydroxide-simeth (MAALOX/MYLANTA) 200-200-20 MG/5ML suspension 30 mL  30 mL Oral Q4H PRN Encarnacion Slates, NP      . calcium carbonate (TUMS - dosed in mg elemental calcium) chewable tablet 200 mg of elemental calcium  1 tablet Oral Daily Nicholaus Bloom, MD   200 mg of elemental calcium at 11/01/14 0753  . cholecalciferol (VITAMIN D) tablet 1,000 Units  1,000 Units Oral Daily Encarnacion Slates, NP   1,000 Units at 11/01/14 0754  . clonazePAM (KLONOPIN) tablet 1 mg  1 mg Oral Daily Nicholaus Bloom, MD   1 mg at 11/01/14 0753  . hydrOXYzine (ATARAX/VISTARIL) tablet 25 mg  25 mg Oral TID Encarnacion Slates, NP  25 mg at 11/01/14 1652  . loratadine (CLARITIN) tablet 10 mg  10 mg Oral Daily Encarnacion Slates, NP   10 mg at 11/01/14 0753  . LORazepam (ATIVAN) 1 MG tablet           . magnesium hydroxide (MILK OF MAGNESIA) suspension 30 mL  30 mL Oral Daily PRN Encarnacion Slates, NP      . multivitamin with minerals tablet 1 tablet  1 tablet Oral Daily Encarnacion Slates, NP   1 tablet at 11/01/14 0753  . QUEtiapine (SEROQUEL) tablet 100 mg  100 mg Oral QHS Nicholaus Bloom, MD   100 mg at 10/31/14 2122  . traZODone (DESYREL) tablet 50 mg  50 mg Oral QHS PRN Encarnacion Slates, NP   50 mg at 10/30/14 2137  . vitamin B-12 (CYANOCOBALAMIN) tablet 1,500 mcg  1,500 mcg Oral Daily Encarnacion Slates, NP   1,500 mcg at 11/01/14 8416    Lab Results: No results found for this or any previous visit (from the past 48 hour(s)).  Physical Findings: AIMS: Facial and Oral Movements Muscles of Facial Expression: None, normal Lips and Perioral Area: None, normal Jaw: None, normal Tongue: None, normal,Extremity Movements Upper (arms, wrists, hands, fingers): None, normal Lower (legs, knees, ankles, toes): None, normal, Trunk Movements Neck, shoulders, hips: None, normal, Overall Severity Severity of abnormal movements (highest score from questions above): None, normal Incapacitation due to abnormal movements: None, normal Patient's awareness  of abnormal movements (rate only patient's report): No Awareness, Dental Status Current problems with teeth and/or dentures?: No Does patient usually wear dentures?: No  CIWA:    COWS:     Treatment Plan Summary: Daily contact with patient to assess and evaluate symptoms and progress in treatment and Medication management Supportive approach/coping skills Mood instability; continue to work with the Seroquel  Anxiety; continue to work with the Klonopin Depression; will follow up on referral for ECT CBT/mindfulness  Medical Decision Making:  Review of Psycho-Social Stressors (1) and Review of Medication Regimen & Side Effects (2)     Carrieann Spielberg A 11/01/2014, 7:10 PM

## 2014-11-01 NOTE — Plan of Care (Signed)
Problem: Diagnosis: Increased Risk For Suicide Attempt Goal: STG-Patient Will Attend All Groups On The Unit Outcome: Progressing Pt attends groups with active participation.

## 2014-11-02 ENCOUNTER — Other Ambulatory Visit: Payer: Self-pay | Admitting: Psychiatry

## 2014-11-02 ENCOUNTER — Inpatient Hospital Stay
Admission: EM | Admit: 2014-11-02 | Discharge: 2014-11-08 | DRG: 885 | Disposition: A | Discharge status: Home / Self Care | Payer: 59 | Source: Other Acute Inpatient Hospital | Attending: Psychiatry | Admitting: Psychiatry

## 2014-11-02 DIAGNOSIS — Z803 Family history of malignant neoplasm of breast: Secondary | ICD-10-CM

## 2014-11-02 DIAGNOSIS — R45851 Suicidal ideations: Secondary | ICD-10-CM | POA: Diagnosis present

## 2014-11-02 DIAGNOSIS — Z9889 Other specified postprocedural states: Secondary | ICD-10-CM | POA: Diagnosis not present

## 2014-11-02 DIAGNOSIS — Z6841 Body Mass Index (BMI) 40.0 and over, adult: Secondary | ICD-10-CM | POA: Diagnosis not present

## 2014-11-02 DIAGNOSIS — F332 Major depressive disorder, recurrent severe without psychotic features: Principal | ICD-10-CM | POA: Diagnosis present

## 2014-11-02 DIAGNOSIS — Z9884 Bariatric surgery status: Secondary | ICD-10-CM | POA: Diagnosis not present

## 2014-11-02 DIAGNOSIS — Z90710 Acquired absence of both cervix and uterus: Secondary | ICD-10-CM | POA: Diagnosis not present

## 2014-11-02 DIAGNOSIS — K219 Gastro-esophageal reflux disease without esophagitis: Secondary | ICD-10-CM | POA: Diagnosis present

## 2014-11-02 DIAGNOSIS — G43909 Migraine, unspecified, not intractable, without status migrainosus: Secondary | ICD-10-CM | POA: Diagnosis present

## 2014-11-02 DIAGNOSIS — E780 Pure hypercholesterolemia: Secondary | ICD-10-CM | POA: Diagnosis present

## 2014-11-02 DIAGNOSIS — Z818 Family history of other mental and behavioral disorders: Secondary | ICD-10-CM | POA: Diagnosis not present

## 2014-11-02 DIAGNOSIS — M1990 Unspecified osteoarthritis, unspecified site: Secondary | ICD-10-CM | POA: Diagnosis present

## 2014-11-02 DIAGNOSIS — Z62810 Personal history of physical and sexual abuse in childhood: Secondary | ICD-10-CM | POA: Diagnosis present

## 2014-11-02 DIAGNOSIS — G4730 Sleep apnea, unspecified: Secondary | ICD-10-CM | POA: Diagnosis present

## 2014-11-02 DIAGNOSIS — Z87891 Personal history of nicotine dependence: Secondary | ICD-10-CM | POA: Diagnosis not present

## 2014-11-02 DIAGNOSIS — Z800 Family history of malignant neoplasm of digestive organs: Secondary | ICD-10-CM | POA: Diagnosis not present

## 2014-11-02 DIAGNOSIS — Z79899 Other long term (current) drug therapy: Secondary | ICD-10-CM | POA: Diagnosis not present

## 2014-11-02 DIAGNOSIS — Z888 Allergy status to other drugs, medicaments and biological substances status: Secondary | ICD-10-CM

## 2014-11-02 DIAGNOSIS — R200 Anesthesia of skin: Secondary | ICD-10-CM

## 2014-11-02 MED ORDER — VITAMIN D3 25 MCG (1000 UNIT) PO TABS: 1000.0000 [IU] | ORAL_TABLET | Freq: Every day | ORAL | Status: DC

## 2014-11-02 MED ORDER — TRAZODONE HCL 50 MG PO TABS: 50.0000 mg | ORAL_TABLET | Freq: Every evening | ORAL | Status: DC | PRN

## 2014-11-02 MED ORDER — LORAZEPAM 1 MG PO TABS
ORAL_TABLET | ORAL | Status: AC
  Administered 2014-11-02: 1 mg via ORAL
  Filled 2014-11-02: qty 1

## 2014-11-02 MED ORDER — HYDROXYZINE HCL 25 MG PO TABS: 25.0000 mg | ORAL_TABLET | Freq: Three times a day (TID) | ORAL | Status: DC

## 2014-11-02 MED ORDER — CLONAZEPAM 1 MG PO TABS: 1.0000 mg | ORAL_TABLET | Freq: Every day | ORAL | Status: DC

## 2014-11-02 MED ORDER — LORAZEPAM 1 MG PO TABS
1.0000 mg | ORAL_TABLET | Freq: Once | ORAL | Status: AC
  Administered 2014-11-02: 1 mg via ORAL

## 2014-11-02 MED ORDER — ADULT MULTIVITAMIN W/MINERALS CH
1.0000 | ORAL_TABLET | Freq: Every day | ORAL | Status: DC
Start: 2014-11-02 — End: 2014-11-08

## 2014-11-02 MED ORDER — LORATADINE 10 MG PO TABS: 10.0000 mg | ORAL_TABLET | Freq: Every day | ORAL | Status: DC

## 2014-11-02 MED ORDER — QUETIAPINE FUMARATE 100 MG PO TABS: 100.0000 mg | ORAL_TABLET | Freq: Every day | ORAL | Status: DC

## 2014-11-02 NOTE — BHH Group Notes (Signed)
Per Kerry Dory in Assessment at [facility], patient accepted for ECT treatment but no bed availability at this time for transfer. Patient would be eligible for ECT on outpatient basis however. MD made aware.  Tilden Fossa, MSW, [place] Worker [facility] [phone]

## 2014-11-02 NOTE — Progress Notes (Signed)
Patient did not attend the evening karaoke group. Pt was waiting to be discharged.

## 2014-11-02 NOTE — BHH Counselor (Signed)
Received phone call from Britton Marcie Bal J.) requesting the patient's admission be held to a latter time due to having problem's with staffing. Patient is still able to come tonight but it may be latter then the scheduled time of 9pm.     Writer called and spoke with [facility] staff Gabriel Cirri, Unit [phone]) and informed them of the situation. Writer was advised to have the [facility] Nurse, to call [facility] when they are ready for her to transfer.  Writer called and updated [facility] nurse Burnard Bunting, RN) of the plan and provided him with the information to contact [facility].   Writer updated [facility] Randall Hiss) of the situation, as well.
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
## 2014-11-02 NOTE — Discharge Summary (Signed)
Physician Discharge Summary Note  Patient:  Brittany [name] is an 49 y.o., female MRN:  [id number] DOB:  Sep 13, 1965 Patient phone:  [phone] (home)  Patient address:   [address] [place] [address],  Total Time spent with patient: 30 minutes  Date of Admission:  10/30/2014 Date of Discharge: 7/2172016  Reason for Admission:  Major depression  Principal Problem: Major depressive disorder, recurrent, severe without psychotic features Discharge Diagnoses: Patient Active Problem List   Diagnosis Date Noted  . Major depressive disorder, recurrent, severe without psychotic features [F33.2]   . MDD (major depressive disorder) [F32.2] 10/30/2014  . MDD (major depressive disorder), recurrent severe, without psychosis [F33.2]   . Suicidal ideation [R45.851] 10/05/2014  . Depression [F32.9] 06/12/2014  . Rash [R21] 01/04/2014  . Normocytic anemia [D64.9] 01/04/2014  . DJD (degenerative joint disease) [M19.90] 01/04/2014  . Hypertension [I10] 01/04/2014  . Dyslipidemia [E78.5] 01/04/2014  . GERD (gastroesophageal reflux disease) [K21.9] 01/04/2014  . Obstructive sleep apnea [G47.33] 01/04/2014  . 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 Abdominal abscess [K65.1] 01/01/2014  . Wound infection after surgery [T81.4XXA] 01/01/2014  . PTSD (post-traumatic stress disorder) [F43.10] 11/23/2013  . Severe recurrent major depression without psychotic features [F33.2] 11/22/2013  . 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 Morbid obesity, Weight - 312, BMI - 45.2 [E66.01] 04/21/2012    Musculoskeletal: Strength & Muscle Tone: within normal limits Gait & Station: normal Patient leans: N/A  Psychiatric Specialty Exam: Physical Exam  Vitals reviewed.   Review of Systems  All other systems reviewed and are negative.   Blood pressure 114/76, pulse 81, temperature 97.6 F (36.4 C), temperature source Oral, resp. rate 16, height 5\' 10"  (1.778  m), weight 91.354 kg (201 lb 6.4 oz).Body mass index is 28.9 kg/(m^2).  General Appearance: Casual  Eye Contact::  Good  Speech:  Clear and Coherent  Volume:  Normal  Mood:  Anxious  Affect:  Appropriate  Thought Process:  Coherent  Orientation:  Full (Time, Place, and Person)  Thought Content:  Rumination  Suicidal Thoughts:  No  Homicidal Thoughts:  No  Memory:  Immediate;   Fair Recent;   Fair Remote;   Fair  Judgement:  Fair  Insight:  Fair  Psychomotor Activity:  Normal  Concentration:  Fair  Recall:  [employer] of Knowledge:Fair  Language: Good  Akathisia:  Negative  Handed:  Right  AIMS (if indicated):     Assets:  Physical Health  ADL's:  Intact  Cognition: WNL  Sleep:  Number of Hours: 5.75   Have you used any form of tobacco in the last 30 days? (Cigarettes, Smokeless Tobacco, Cigars, and/or Pipes): Yes  Has this patient used any form of tobacco in the last 30 days? (Cigarettes, Smokeless Tobacco, Cigars, and/or Pipes) N/A  Past Medical History:  Past Medical History  Diagnosis Date  . Migraine   . Hypertension   . Hypercholesterolemia   . Depression   . Morbid obesity   . Sleep apnea     no CPAP machine   . H/O hiatal hernia   . GERD (gastroesophageal reflux disease)     hx of   . Arthritis     hips, knees and hands   . Anxiety   . PTSD (post-traumatic stress disorder)     Past Surgical History  Procedure Laterality Date  . Foot surgery    . Breath tek h pylori  05/03/2012    Procedure:  BREATH TEK H PYLORI;  Surgeon: [name], MD;  Location: [name];  Service: General;  Laterality: N/A;  . Right tube and ovary removed     . Abdominal hysterectomy  1997 & 2006  . Colonoscopy      hx of benign polyps   . 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 Upper gi endoscopy N/A 07/05/2012    Procedure: UPPER GI ENDOSCOPY;   Surgeon: [name], MD;  Location: [facility];  Service: General;  Laterality: N/A;  . Laparoscopic lysis of adhesions N/A 07/05/2012    Procedure: LAPAROSCOPIC LYSIS OF ADHESIONS;  Surgeon: [name], MD;  Location: [facility];  Service: General;  Laterality: N/A;  repair of abdominal wall hernia  . Irrigation and debridement abscess N/A 01/01/2014    Procedure: IRRIGATION AND DEBRIDEMENT ABSCESS;  Surgeon: [name], MD;  Location: [facility];  Service: General;  Laterality: N/A;  . Gastric bypass     Family History:  Family History  Problem Relation Age of Onset  . Cancer Mother     colon  . Cancer Maternal Aunt     breast  . Depression Father   . Depression Sister   . Post-traumatic stress disorder Sister   . Alcohol abuse Paternal Uncle   . Bipolar disorder Cousin   . Anxiety disorder Sister    Social History:  History  Alcohol Use No    Comment: Occasional use     History  Drug Use No    History   Social History  . Marital Status: Married    Spouse Name: N/A  . Number of Children: N/A  . Years of Education: N/A   Social History Main Topics  . Smoking status: Former Smoker    Types: Cigars    Quit date: 04/27/2004  . Smokeless tobacco: Never Used  . Alcohol Use: No     Comment: Occasional use  . Drug Use: No  . Sexual Activity: Yes   Other Topics Concern  . None   Social History Narrative   Risk to Self: Suicidal Ideation: Yes-Currently Present Suicidal Intent: No-Not Currently/Within Last 6 Months Is patient at risk for suicide?: Yes Suicidal Plan?: Yes-Currently Present Specify Current Suicidal Plan: overdose or run up under the family truck Access to Means: Yes Specify Access to Suicidal Means: Rx medications, vehicle What has been your use of drugs/alcohol within the last 12 months?: denies How many times?: 1 (gesture) Triggers for Past Attempts: Family contact, Other personal contacts Intentional Self Injurious Behavior: None Risk to  Others: Homicidal Ideation: No Thoughts of Harm to Others: No Current Homicidal Intent: No Current Homicidal Plan: No Access to Homicidal Means: No History of harm to others?: No Assessment of Violence: None Noted Does patient have access to weapons?: No ((locked up)) Criminal Charges Pending?: No Does patient have a court date: No Prior Inpatient Therapy: Prior Inpatient Therapy: Yes Prior Therapy Dates: 2015;2016 Prior Therapy Facilty/Provider(s): [facility] Reason for Treatment: SI Prior Outpatient Therapy: Prior Outpatient Therapy: Yes Prior Therapy Dates: Ongoing Prior Therapy Facilty/Provider([facility]): [facility] Reason for Treatment: Medication Management/depression anxiety Does patient have an ACCT team?: No Does patient have Intensive In-House Services?  : No Does patient have [facility] services? : No Does patient have P4CC services?: No  Level of Care:  [facility] Course: Vi Lenise Arena, 48 Y/O  female states she cannot function and performs her ADL's.  She stated she was depressed, felt her life out of control and having auditory hallucinations.  She is unable to specify triggers.  She is also confused at what is causing her depression.  She states she has a job and good family life.   Patient accepted at [facility] by Dr Weber Cooks for ECT treatment.  Consults:  psychiatry  Significant Diagnostic Studies:  labs: per ED  Discharge Vitals:   Blood pressure 114/76, pulse 81, temperature 97.6 F (36.4 C), temperature source Oral, resp. rate 16, height 5\' 10"  (1.778 m), weight 91.354 kg (201 lb 6.4 oz). Body mass index is 28.9 kg/(m^2). Lab Results:   No results found for this or any previous visit (from the past 72 hour(s)).  Physical Findings: AIMS: Facial and Oral Movements Muscles of Facial Expression: None, normal Lips and Perioral Area: None, normal Jaw: None, normal Tongue: None, normal,Extremity Movements Upper (arms, wrists, hands, fingers): None,  normal Lower (legs, knees, ankles, toes): None, normal, Trunk Movements Neck, shoulders, hips: None, normal, Overall Severity Severity of abnormal movements (highest score from questions above): None, normal Incapacitation due to abnormal movements: None, normal Patient's awareness of abnormal movements (rate only patient's report): No Awareness, Dental Status Current problems with teeth and/or dentures?: No Does patient usually wear dentures?: No  CIWA:    COWS:      See Psychiatric Specialty Exam and Suicide Risk Assessment completed by Attending Physician prior to discharge.  Discharge destination:  Other:  [place]  Is patient on multiple antipsychotic therapies at discharge:  No   Has Patient had three or more failed trials of antipsychotic monotherapy by history:  No    Recommended Plan for Multiple Antipsychotic Therapies: NA     Medication List    STOP taking these medications        acetaminophen 325 MG tablet  Commonly known as:  TYLENOL     calcium citrate 950 MG tablet  Commonly known as:  CALCITRATE - dosed in mg elemental calcium     cetirizine 10 MG tablet  Commonly known as:  ZYRTEC  Replaced by:  loratadine 10 MG tablet     DULoxetine 60 MG capsule  Commonly known as:  CYMBALTA     hydrocortisone 2.5 % rectal cream  Commonly known as:  ANUSOL-HC     lithium carbonate 450 MG CR tablet  Commonly known as:  ESKALITH     vitamin B-12 500 MCG tablet  Commonly known as:  CYANOCOBALAMIN      TAKE these medications      Indication   cholecalciferol 1000 UNITS tablet  Commonly known as:  VITAMIN D  Take 1 tablet (1,000 Units total) by mouth daily.   Indication:  Per client self report for Vitamin D supplementation     clonazePAM 1 MG tablet  Commonly known as:  KLONOPIN  Take 1 tablet (1 mg total) by mouth daily.   Indication:  Panic Disorder     hydrOXYzine 25 MG tablet  Commonly known as:  ATARAX/VISTARIL  Take 1 tablet (25 mg total) by mouth 3  (three) times daily.   Indication:  Anxiety Neurosis     loratadine 10 MG tablet  Commonly known as:  CLARITIN  Take 1 tablet (10 mg total) by mouth daily.   Indication:  Hayfever     multivitamin with minerals Tabs tablet  Take 1 tablet by mouth daily.   Indication:  Low vitamin  QUEtiapine 100 MG tablet  Commonly known as:  SEROQUEL  Take 1 tablet (100 mg total) by mouth at bedtime.   Indication:  mood stabilization     traZODone 50 MG tablet  Commonly known as:  DESYREL  Take 1 tablet (50 mg total) by mouth at bedtime as needed for sleep.   Indication:  Trouble Sleeping        Follow-up recommendations:  Activity:  as tol, diet as tol  Comments:  Transfer   Total Discharge Time: 30 min  Signed: Freda Munro May Agustin AGNP-BC 11/02/2014, 6:32 PM  I personally assessed the patient and formulated the plan Geralyn Flash A. Sabra Heck, M.D.

## 2014-11-02 NOTE — [facility] Group Notes (Signed)
[place] LCSW Group Therapy 11/02/2014 1:15 PM Type of Therapy: Group Therapy Participation Level: Active  Participation Quality: Attentive   Affect: Depressed and Flat  Cognitive: Alert and Oriented  Insight: Developing/Improving and Engaged  Engagement in Therapy: Developing/Improving and Engaged  Modes of Intervention: Activity, Clarification, Confrontation, Discussion, Education, Exploration, Limit-setting, Orientation, Problem-solving, Rapport Building, Art therapist, Socialization and Support  Summary of Progress/Problems: Patient was attentive and engaged with speaker from [place]. Patient was attentive to speaker while they shared their story of dealing with mental health and overcoming it. Patient expressed interest in their programs and services and received information on their agency. Patient processed ways they can relate to the speaker.   Tilden Fossa, MSW, [place] Worker [facility] [phone]

## 2014-11-02 NOTE — Progress Notes (Signed)
Handoff report received from [facility], RN; awaiting transport.

## 2014-11-02 NOTE — [facility] Counselor (Signed)
Late Entry----Information forwarded to Psych MD (Dr. Weber Cooks) and per Psych MD, patient is a good candidate to receive ECT Treatment.  However, their are no beds with [facility] at this time. Psych MD offered to have patient seen in outpatient level, if it's able.

## 2014-11-02 NOTE — [facility] Counselor (Signed)
Pt. has been accepted to [facility].    Her bed will be available anytime after 9pm.  Accepting and attending physician is Dr. Weber Cooks.   Call report to [phone].   Pt. has been assigned to room 322, by [place] Charge Nurse, Jake Church.    [facility] staff [name], [facility]) is aware of the admission.  Bed Tracker giving to Patient Access Debroah Baller) to pre-admit the patient.

## 2014-11-02 NOTE — Progress Notes (Signed)
Patient ID: Brittany [name], female   DOB: [birth year], 49 y.o.   MRN: [id number]  Pt currently presents with a flat and sometimes tearful affect and depressed behavior. Pt reports good appetite and sleep. Pt reports increased anxiety about "getting healthy." Pt expresses wishes to go to [place] for ECT when she leaves [facility].   Pt provided with medications per providers orders. Pt's labs and vitals were monitored throughout the day. Pt supported emotionally and encouraged to express concerns and questions. Pt educated on medications and relaxation techniques.   Pt's safety ensured with 15 minute and environmental checks. Pt currently denies SI/HI and A/V hallucinations. Pt verbally agrees to seek staff if SI/HI or A/VH occurs and to consult with staff before acting on these thoughts. Will continue POC.

## 2014-11-02 NOTE — Progress Notes (Addendum)
Patient in day room at the beginning of the shift. Mood and affect flat and depressed. She reported that she had a rough day earlier; "sensory overload" she stated. She reported that she was given Ativan and the medication calmed her down. Patient denied SI/HI and denied Hallucinations during this assessment. She is aware of her transfer to [facility] and she is ready for the transfer. Transportation company called that one of their vehicle broke down and they not going to be here until 11pm. Probation officer called report to [place] at [facility]. Patient and staff at the [facility] notified about the delay in transportation. Q 15 minute check continues for safety.   2305: Patient's transportation arrived here at 2250. Belongings returned to patient and patient signed belonging paperwork. Patient discharged as ordered.

## 2014-11-02 NOTE — Progress Notes (Signed)
Patient ID: Brittany [name], female   DOB: 1965-04-23, 49 y.o.   MRN: [id number]   Pt to be discharged to [facility], bed 322 tonight. [facility] has been called and is aware of transport, ETA 2000. Writer called [place], report deferred to after 1930. SRA completed by MD. Report will be given to oncoming nurse. AVS printed, completed and placed in pt physical chart.

## 2014-11-03 ENCOUNTER — Inpatient Hospital Stay: Payer: 59

## 2014-11-03 DIAGNOSIS — F332 Major depressive disorder, recurrent severe without psychotic features: Principal | ICD-10-CM

## 2014-11-03 LAB — COMPREHENSIVE METABOLIC PANEL
ALT: 13 U/L — AB (ref 14–54)
ANION GAP: 10 (ref 5–15)
AST: 11 U/L — ABNORMAL LOW (ref 15–41)
Albumin: 4.2 g/dL (ref 3.5–5.0)
Alkaline Phosphatase: 83 U/L (ref 38–126)
BUN: 16 mg/dL (ref 6–20)
CALCIUM: 9.3 mg/dL (ref 8.9–10.3)
CHLORIDE: 103 mmol/L (ref 101–111)
CO2: 27 mmol/L (ref 22–32)
CREATININE: 0.51 mg/dL (ref 0.44–1.00)
GFR calc Af Amer: >60 mL/min (ref >60)
GFR calc non Af Amer: >60 mL/min (ref >60)
GLUCOSE: 105 mg/dL — AB (ref 65–99)
Potassium: 4.1 mmol/L (ref 3.5–5.1)
Sodium: 140 mmol/L (ref 135–145)
TOTAL PROTEIN: 7.4 g/dL (ref 6.5–8.1)
Total Bilirubin: 1 mg/dL (ref 0.3–1.2)

## 2014-11-03 LAB — CBC
HCT: 37.7 % (ref 35.0–47.0)
HEMOGLOBIN: 12.8 g/dL (ref 12.0–16.0)
MCH: 32.6 pg (ref 26.0–34.0)
MCHC: 33.9 g/dL (ref 32.0–36.0)
MCV: 96.4 fL (ref 80.0–100.0)
Platelets: 331 10*3/uL (ref 150–440)
RBC: 3.91 MIL/uL (ref 3.80–5.20)
RDW: 12.6 % (ref 11.5–14.5)
WBC: 6.1 10*3/uL (ref 3.6–11.0)

## 2014-11-03 LAB — LIPID PANEL
Cholesterol: 236 mg/dL — ABNORMAL HIGH (ref 0–200)
HDL: 66 mg/dL (ref >40)
LDL Cholesterol: 155 mg/dL — ABNORMAL HIGH (ref 0–99)
Total CHOL/HDL Ratio: 3.6 RATIO
Triglycerides: 76 mg/dL (ref <150)
VLDL: 15 mg/dL (ref 0–40)

## 2014-11-03 LAB — TSH: TSH: 2.669 u[IU]/mL (ref 0.350–4.500)

## 2014-11-03 MED ORDER — ACETAMINOPHEN 325 MG PO TABS: 650.0000 mg | ORAL_TABLET | Freq: Four times a day (QID) | ORAL | Status: DC | PRN

## 2014-11-03 MED ORDER — TRAZODONE HCL 50 MG PO TABS
50.0000 mg | ORAL_TABLET | Freq: Every evening | ORAL | Status: DC | PRN
  Administered 2014-11-07: 50 mg via ORAL
  Filled 2014-11-03: qty 1

## 2014-11-03 MED ORDER — LORATADINE 10 MG PO TABS
10.0000 mg | ORAL_TABLET | Freq: Every day | ORAL | Status: DC
  Administered 2014-11-03 – 2014-11-08 (×6): 10 mg via ORAL
  Filled 2014-11-03 (×6): qty 1

## 2014-11-03 MED ORDER — ADULT MULTIVITAMIN W/MINERALS CH
1.0000 | ORAL_TABLET | Freq: Every day | ORAL | Status: DC
  Administered 2014-11-03 – 2014-11-08 (×6): 1 via ORAL
  Filled 2014-11-03 (×6): qty 1

## 2014-11-03 MED ORDER — MAGNESIUM HYDROXIDE 400 MG/5ML PO SUSP: 30.0000 mL | Freq: Every day | ORAL | Status: DC | PRN

## 2014-11-03 MED ORDER — QUETIAPINE FUMARATE 100 MG PO TABS
100.0000 mg | ORAL_TABLET | Freq: Every day | ORAL | Status: DC
  Administered 2014-11-03 – 2014-11-07 (×5): 100 mg via ORAL
  Filled 2014-11-03 (×5): qty 1

## 2014-11-03 MED ORDER — ALUM & MAG HYDROXIDE-SIMETH 200-200-20 MG/5ML PO SUSP: 30.0000 mL | ORAL | Status: DC | PRN

## 2014-11-03 MED ORDER — CLONAZEPAM 1 MG PO TABS
1.0000 mg | ORAL_TABLET | Freq: Every day | ORAL | Status: DC
  Administered 2014-11-03 – 2014-11-08 (×6): 1 mg via ORAL
  Filled 2014-11-03 (×6): qty 1

## 2014-11-03 MED ORDER — VITAMIN D3 25 MCG (1000 UNIT) PO TABS
1000.0000 [IU] | ORAL_TABLET | Freq: Every day | ORAL | Status: DC
  Administered 2014-11-03 – 2014-11-08 (×6): 1000 [IU] via ORAL
  Filled 2014-11-03 (×12): qty 1

## 2014-11-03 MED ORDER — HYDROXYZINE HCL 25 MG PO TABS
25.0000 mg | ORAL_TABLET | Freq: Three times a day (TID) | ORAL | Status: DC
  Administered 2014-11-03 – 2014-11-08 (×16): 25 mg via ORAL
  Filled 2014-11-03 (×16): qty 1

## 2014-11-03 NOTE — Plan of Care (Signed)
Problem: Alteration in mood; excessive anxiety as evidenced by: Goal: LTG-Patient's behavior demonstrates decreased anxiety 7/22: Goal not met. Patient transferred to [facility] for ECT treatments.   Tilden Fossa, MSW, [place] Worker [facility] [phone] Outcome: Not Progressing This writer spoke with client, who states, "I don't know what to do; I'm so upset; I'm depressed and anxious; my meds were changed; the med changes are making me feel more depressed and have more anxiety."

## 2014-11-03 NOTE — [facility] Group Notes (Signed)
[facility] LCSW Aftercare Discharge Planning Group Note   11/03/2014 4:48 PM  Participation Quality:  Minimal   Mood/Affect:  Depressed and Tearful  Depression Rating:  9  Anxiety Rating:  9  Thoughts of Suicide:  No Will you contract for safety?   NA  Current AVH:  No  Plan for Discharge/Comments:  Pt states she is not doing well today. She was tearful and anxious during group. She states she was transferred to [facility] from [facility] for ECT. Pt plans to return home and follow up with outpatient.   Transportation Means: Family   Supports: family   Wray Kearns MSW, [place]  11/03/2014

## 2014-11-03 NOTE — Progress Notes (Signed)
Recreation Therapy Notes  INPATIENT RECREATION THERAPY ASSESSMENT  Patient Details Name: Brittany [name] MRN: [id number] DOB: 1965/08/06 Today's Date: 11-13-2014  Patient Stressors: Death, Work, Other (Comment) (The transfer from [facility] to [facility] has been stressful.)  Coping Skills:   Isolate, Arguments, Avoidance, Exercise, Art/Dance, Talking, Music, Other (Comment) (CBT, relaxation exericises, breathing exercises, being outside, gardening, taking care of her chickens)  Personal Challenges: Anger, Communication, Concentration, Decision-Making, Expressing Yourself, Problem-Solving, Relationships, Self-Esteem/Confidence, Stress Management, Trusting Others  Leisure Interests (2+):  Art - Coloring, Individual - Other (Comment) (Taking care of her chickens)  Awareness of Community Resources:  Yes  Community Resources:  Park, Other (Comment) ([employer]  Current Use: Yes  If no, Barriers?:    Patient Strengths:  Does a good job at work, raised two daughters  Patient Identified Areas of Improvement:  Depression, get out of the house and yard more  Current Recreation Participation:  Nothing  Patient Goal for Hospitalization:  To get into the [place] of Residence:  [place] of Residence:  [place]   Current [place] (including self-harm):  Yes  Current HI:  No  Consent to Intern Participation: N/A   Leonette [facility], LRT/CTRS Nov 13, 2014, 1:42 PM

## 2014-11-03 NOTE — Tx Team (Signed)
Initial Interdisciplinary Treatment Plan   PATIENT STRESSORS: Health problems Traumatic event   PATIENT STRENGTHS: Ability for insight Average or above average intelligence Capable of independent living Motivation for treatment/growth Supportive family/friends   PROBLEM LIST: Problem List/Patient Goals Date to be addressed Date deferred Reason deferred Estimated date of resolution  Depression - Treatment Resistant (Requires ECT) 11/03/2014     Suicidal Ideation 11/03/2014     Anxiety - GAD/Panic Attacks 11/03/2014                                          DISCHARGE CRITERIA:  Ability to meet basic life and health needs Adequate post-discharge living arrangements Improved stabilization in mood, thinking, and/or behavior Motivation to continue treatment in a less acute level of care Reduction of life-threatening or endangering symptoms to within safe limits Verbal commitment to aftercare and medication compliance  PRELIMINARY DISCHARGE PLAN: Attend aftercare/continuing care group Return to previous living arrangement Return to previous work or school arrangements  PATIENT/FAMIILY INVOLVEMENT: This treatment plan has been presented to and reviewed with the patient, Brittany [name].  The patient and family have been given the opportunity to ask questions and make suggestions.  [name] [name] 11/03/2014, 1:46 AM

## 2014-11-03 NOTE — Progress Notes (Signed)
  [facility] Adult Case Management Discharge Plan :  Will you be returning to the same living situation after discharge:  No. Patient transferred to [facility] for ECT Treatments At discharge, do you have transportation home?: Yes,  [facility] transported patient to [facility] Do you have the ability to pay for your medications: Yes,  patient will be provided with necessary medications and prescriptions when she returns home  Release of information consent forms completed and in the chart;  Patient's signature needed at discharge.  Patient to Follow up at: Follow-up Information    Follow up with [facility] for inpatient ECT treatments.      Patient denies SI/HI: Tranfered to [facility] for ECT treatments  Safety Planning and Suicide Prevention discussed: Yes,  with patient and husband  Have you used any form of tobacco in the last 30 days? (Cigarettes, Smokeless Tobacco, Cigars, and/or Pipes): Yes  Has patient been referred to the Quitline?: Patient refused referral  Brittany [name], Casimiro Needle 11/03/2014, 8:11 AM

## 2014-11-03 NOTE — Clinical Social Work Note (Signed)
CSW called husband Stevana Dufner [phone] to inform him of patient's transfer to [facility].   Tilden Fossa, MSW, [place] Worker [facility] [phone]
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
## 2014-11-03 NOTE — Progress Notes (Signed)
PTA Reconciled, Dr. Gretel Acre paged twice, awaiting response

## 2014-11-03 NOTE — Progress Notes (Signed)
Arrived to unit in the custody of [employer], accompanied by hospital nursing staff, patient searched by 2 female nursing staffs on admission for contrabands to ensure the patient and others the right to a safe and therapeutic milieu, no paraphilia or any other contrabands found on patient or in the belongings. Skin Assessment completed and documented. Currently, patient denies desire to terminate life, denies thoughts of harm to self or others, patient said, "I tried to numb myself from depression and sadness last month, I took 3 mg of Klonopin, some Tequila, ate some chocolate and smoke a lot of cigarettes, just to numb myself and not in the context of killing myself. Although, I am not afraid of dying..." Patient is employed by [employer] in a capacity similar to [employer]; have a supporting husband and 2 nonbiological adult daughters (56 & 33) that she raised with her husband since age 55 & 72. Voluntarily admitted to Dr. Weber Cooks for ECT due to MDD recurrent, referred by Dr. Sabra Heck. PMHx: Multiples.

## 2014-11-03 NOTE — BHH Group Notes (Signed)
[place] Group Notes:  (Nursing/MHT/Case Management/Adjunct)  Date:  11/03/2014  Time:  11:50 AM  Type of Therapy:  Group Therapy  Participation Level:  Active  Participation Quality:  Appropriate, Attentive, Sharing and Supportive  Affect:  Appropriate  Cognitive:  Alert, Appropriate and Oriented  Insight:  Appropriate  Engagement in Group:  Engaged  Modes of Intervention:  Activity  Summary of Progress/Problems:  Allean Montfort De'Chelle Tre Sanker 11/03/2014, 11:50 AM

## 2014-11-03 NOTE — Progress Notes (Signed)
tHIS WRITER SPOKE WITH CLIENT THIS A.M. WHO PRESENTED WITH BEING TEARFULL; WITH STATING, "I DON'T KNOW WHAT TO DO; I DON'T KNOW WHERE TO GO; I DON'T HAVE ANY INSTRUCTIONS." CLIENT WAS PROVIDED WITH A UNIT TOUR; CLIENT WAS GIVEN A UNIT HANDBOOK. A TELEPHONE CALL WAS PLACED WITH DR. Weber Cooks; WHO GAVE A VERBAL ORDER TO PUT IN ADMISSION ORDER; UNIT CHARGE NURSE CALLED THE DIETARY DEPARTMENT TO REQUEST A TRAY TRAY FOR CLIENT. CLIENT ALSO STATES, "I'M SO UPSET." THIS WRITER INFORMED CLIENT THAT DR. Weber Cooks WAS NOTIFIED. CLIENT STATED THAT, "I WAS IN [place]; I WAS ON (5) DIFFERENT MEDICATIONS FOR DEPRESSION AND ANXIETY; THE MEDICATIONS THEY TOOK AWAY HAS BEEN CAUSING ME TO HAVE MORE DEPRESSION; AND I CRY A LOT; I FEEL BETTER KNOWING ABOUT THOSE THINGS."

## 2014-11-03 NOTE — Progress Notes (Signed)
Recreation Therapy Notes  Date: 07.22.16 Time: 3:00 pm Location: Craft Room  Group Topic: [place]  Goal Area(s) Addresses:  Patients will learn a healthy coping skill. Patients will verbalized benefit of coloring.  Behavioral Response: Attentive, Left early  Intervention: [place]  Activity: Patients colored a mandala to learn a new coping skill.  Education: LRT educated patients on the benefits of coloring.   Education Outcome: Acknowledges education/In group clarification offered  Clinical Observations/Feedback: Patient colored mandala. Patient left group at approximately 3:24 pm with nursing. Patient did not return to group.  Leonette Monarch, LRT/CTRS 11/03/2014 4:26 PM

## 2014-11-03 NOTE — Plan of Care (Deleted)
Problem: Alteration in mood Goal: LTG-Patient reports reduction in suicidal thoughts (Patient reports reduction in suicidal thoughts and is able to verbalize a safety plan for whenever patient is feeling suicidal)  Outcome: Progressing CLIENT VERBALIZED TODAY THAT, "I'M NOT SUICIDAL; I'M NOT HEARING VOICES; I'M HAVING A LOT OF PAIN IN MY TOOTH; I NEED A DENTAL APPOINTMENT." CLIENT WAS GIVEN MEDICATION THIS A.M.; WAS GIVEN PERCOCET AND TYLENOL; WITH MINIMAL RELIEF.
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
## 2014-11-03 NOTE — BHH Group Notes (Signed)
[place] LCSW Group Therapy  11/03/2014 3:49 PM  Type of Therapy:  Group Therapy  Participation Level:  Active  Participation Quality:  Appropriate, Attentive and patient was called out by staff early  Affect:  Appropriate  Cognitive:  Alert, Appropriate and Oriented  Insight:  Improving  Engagement in Therapy:  Engaged  Modes of Intervention:  Discussion, Socialization and Support  Summary of Progress/Problems: Patient attended group and shared that her vacation spot to go would be "transcontinental train through [place]". Patient participated in group discussion but was called out by staff after 10 minutes of group and did not return.   Keene Breath, MSW, LCSWA 11/03/2014, 3:49 PM

## 2014-11-04 LAB — HEMOGLOBIN A1C: Hgb A1c MFr Bld: 5.2 % (ref 4.0–6.0)

## 2014-11-04 MED ORDER — LORAZEPAM 1 MG PO TABS: 1.0000 mg | ORAL_TABLET | ORAL | Status: DC | PRN

## 2014-11-04 MED ORDER — DULOXETINE HCL 60 MG PO CPEP
60.0000 mg | ORAL_CAPSULE | Freq: Every day | ORAL | Status: DC
  Administered 2014-11-04 – 2014-11-08 (×5): 60 mg via ORAL
  Filled 2014-11-04 (×5): qty 1

## 2014-11-04 NOTE — BHH Group Notes (Signed)
[place] Group Notes:  (Nursing/MHT/Case Management/Adjunct)  Date:  11/04/2014  Time:  3:15 AM  Type of Therapy:  Group Therapy  Participation Level:  Active  Participation Quality:  Appropriate and Sharing  Affect:  Appropriate  Cognitive:  Appropriate  Insight:  Good  Engagement in Group:  Developing/Improving  Modes of Intervention:  n/a  Summary of Progress/Problems:  Brittany [name] 11/04/2014, 3:15 AM

## 2014-11-04 NOTE — Progress Notes (Signed)
[facility] MD Progress Note  11/04/2014 3:33 PM Brittany [name]  MRN:  [id number] Subjective:  Follow-up for this patient with severe recurrent major depression who is being evaluated for ECT Principal Problem: MDD (major depressive disorder), recurrent severe, without psychosis Diagnosis:   Patient Active Problem List   Diagnosis Date Noted  . Major depressive disorder, recurrent, severe without psychotic features [F33.2]   . MDD (major depressive disorder) [F32.2] 10/30/2014  . MDD (major depressive disorder), recurrent severe, without psychosis [F33.2]   . Suicidal ideation [R45.851] 10/05/2014  . Depression [F32.9] 06/12/2014  . Rash [R21] 01/04/2014  . Normocytic anemia [D64.9] 01/04/2014  . DJD (degenerative joint disease) [M19.90] 01/04/2014  . Hypertension [I10] 01/04/2014  . Dyslipidemia [E78.5] 01/04/2014  . GERD (gastroesophageal reflux disease) [K21.9] 01/04/2014  . Obstructive sleep apnea [G47.33] 01/04/2014  . 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 Abdominal abscess [K65.1] 01/01/2014  . Wound infection after surgery [T81.4XXA] 01/01/2014  . PTSD (post-traumatic stress disorder) [F43.10] 11/23/2013  . Severe recurrent major depression without psychotic features [F33.2] 11/22/2013  . 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 Morbid obesity, Weight - 312, BMI - 45.2 [E66.01] 04/21/2012   Total Time spent with patient: 45 minutes   Past Medical History:  Past Medical History  Diagnosis Date  . Migraine   . Hypertension   . Hypercholesterolemia   . Depression   . Morbid obesity   . Sleep apnea     no CPAP machine   . H/O hiatal hernia   . GERD (gastroesophageal reflux disease)     hx of   . Arthritis     hips, knees and hands   . Anxiety   . PTSD (post-traumatic stress disorder)     Past Surgical History  Procedure Laterality Date  . Foot surgery    . Breath tek h pylori  05/03/2012    Procedure: BREATH TEK H PYLORI;   Surgeon: [name], MD;  Location: [name];  Service: General;  Laterality: N/A;  . Right tube and ovary removed     . Abdominal hysterectomy  1997 & 2006  . Colonoscopy      hx of benign polyps   . 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 Upper gi endoscopy N/A 07/05/2012    Procedure: UPPER GI ENDOSCOPY;  Surgeon: [name], MD;  Location: [facility];  Service: General;  Laterality: N/A;  . Laparoscopic lysis of adhesions N/A 07/05/2012    Procedure: LAPAROSCOPIC LYSIS OF ADHESIONS;  Surgeon: [name], MD;  Location: [facility];  Service: General;  Laterality: N/A;  repair of abdominal wall hernia  . Irrigation and debridement abscess N/A 01/01/2014    Procedure: IRRIGATION AND DEBRIDEMENT ABSCESS;  Surgeon: [name], MD;  Location: [facility];  Service: General;  Laterality: N/A;  . Gastric bypass     Family History:  Family History  Problem Relation Age of Onset  . Cancer Mother     colon  . Cancer Maternal Aunt     breast  . Depression Father   . Depression Sister   . Post-traumatic stress disorder Sister   . Alcohol abuse Paternal Uncle   . Bipolar disorder Cousin   . Anxiety disorder Sister    Social History:  History  Alcohol Use No    Comment: Occasional use  History  Drug Use No    History   Social History  . Marital Status: Married    Spouse Name: N/A  . Number of Children: N/A  . Years of Education: N/A   Social History Main Topics  . Smoking status: Former Smoker    Types: Cigars    Quit date: 04/27/2004  . Smokeless tobacco: Never Used  . Alcohol Use: No     Comment: Occasional use  . Drug Use: No  . Sexual Activity: Yes   Other Topics Concern  . None   Social History Narrative   Additional History:   patient today states that her mood is feeling worse. More depressed and anxious. Having suicidal  thoughts today without any intention of acting on them. Not having any hallucinations. Upset about the chaos on the ward currently  Sleep: Fair  Appetite:  Poor   Assessment: Slightly worse today. Anxious. Possibly related to rapid discontinuation of antidepressives.  Musculoskeletal: Strength & Muscle Tone: within normal limits Gait & Station: normal Patient leans: N/A   Psychiatric Specialty Exam: Physical Exam  Constitutional: She appears well-developed and well-nourished.  HENT:  Head: Normocephalic and atraumatic.  Eyes: Conjunctivae are normal. Pupils are equal, round, and reactive to light.  Neck: Normal range of motion.  Cardiovascular: Normal heart sounds.   Respiratory: Effort normal.  GI: Soft.  Musculoskeletal: Normal range of motion.  Neurological: She is alert.  Skin: Skin is warm and dry.  Psychiatric: Her mood appears anxious. Her speech is delayed. She is agitated. Cognition and memory are normal. She expresses impulsivity. She exhibits a depressed mood. She expresses suicidal ideation.    Review of Systems  Constitutional: Negative.   HENT: Negative.   Eyes: Negative.   Respiratory: Negative.   Cardiovascular: Negative.   Gastrointestinal: Negative.   Musculoskeletal: Negative.   Skin: Negative.   Neurological: Negative.   Psychiatric/Behavioral: Positive for depression and suicidal ideas. Negative for hallucinations and substance abuse. The patient is nervous/anxious.     Blood pressure 123/85, pulse 67, temperature 97.9 F (36.6 C), temperature source Oral, resp. rate 20, height $RemoveBe'5\' 10"'jAPYgWGFc$  (1.778 m), weight 91.627 kg (202 lb), SpO2 99 %.Body mass index is 28.98 kg/(m^2).  General Appearance: Casual  Eye Contact::  Fair  Speech:  Normal Rate  Volume:  Decreased  Mood:  Depressed  Affect:  Depressed  Thought Process:  Irrelevant  Orientation:  Full (Time, Place, and Person)  Thought Content:  Negative  Suicidal Thoughts:  Yes.  without intent/plan   Homicidal Thoughts:  No  Memory:  Immediate;   Good Recent;   Good Remote;   Good  Judgement:  Intact  Insight:  Present  Psychomotor Activity:  Restlessness  Concentration:  Fair  Recall:  [place] of Knowledge:Fair  Language: Good  Akathisia:  No  Handed:  Right  AIMS (if indicated):     Assets:  Communication Skills Desire for Improvement Housing Resilience Social Support  ADL's:  Intact  Cognition: WNL  Sleep:  Number of Hours: 7.25     Current Medications: Current Facility-Administered Medications  Medication Dose Route Frequency Provider Last Rate Last Dose  . acetaminophen (TYLENOL) tablet 650 mg  650 mg Oral Q6H PRN Gonzella Lex, MD      . alum & mag hydroxide-simeth (MAALOX/MYLANTA) 200-200-20 MG/5ML suspension 30 mL  30 mL Oral Q4H PRN Gonzella Lex, MD      . cholecalciferol (VITAMIN D) tablet 1,000 Units  1,000 Units Oral Daily  Gonzella Lex, MD   1,000 Units at 11/04/14 [phone]  . clonazePAM (KLONOPIN) tablet 1 mg  1 mg Oral Daily Gonzella Lex, MD   1 mg at 11/04/14 0909  . DULoxetine (CYMBALTA) DR capsule 60 mg  60 mg Oral Daily Gonzella Lex, MD   60 mg at 11/04/14 1311  . hydrOXYzine (ATARAX/VISTARIL) tablet 25 mg  25 mg Oral TID Gonzella Lex, MD   25 mg at 11/04/14 0909  . loratadine (CLARITIN) tablet 10 mg  10 mg Oral Daily Gonzella Lex, MD   10 mg at 11/04/14 0909  . LORazepam (ATIVAN) tablet 1 mg  1 mg Oral Q4H PRN Gonzella Lex, MD      . magnesium hydroxide (MILK OF MAGNESIA) suspension 30 mL  30 mL Oral Daily PRN Gonzella Lex, MD      . multivitamin with minerals tablet 1 tablet  1 tablet Oral Daily Gonzella Lex, MD   1 tablet at 11/04/14 0909  . QUEtiapine (SEROQUEL) tablet 100 mg  100 mg Oral QHS Gonzella Lex, MD   100 mg at 11/03/14 2143  . traZODone (DESYREL) tablet 50 mg  50 mg Oral QHS PRN Gonzella Lex, MD        Lab Results:  Results for orders placed or performed during the hospital encounter of 11/02/14 (from the past 48  hour(s))  CBC     Status: None   Collection Time: 11/03/14  4:35 PM  Result Value Ref Range   WBC 6.1 3.6 - 11.0 K/uL   RBC 3.91 3.80 - 5.20 MIL/uL   Hemoglobin 12.8 12.0 - 16.0 g/dL   HCT 37.7 35.0 - 47.0 %   MCV 96.4 80.0 - 100.0 fL   MCH 32.6 26.0 - 34.0 pg   MCHC 33.9 32.0 - 36.0 g/dL   RDW 12.6 11.5 - 14.5 %   Platelets 331 150 - 440 K/uL  Comprehensive metabolic panel     Status: Abnormal   Collection Time: 11/03/14  4:35 PM  Result Value Ref Range   Sodium 140 135 - 145 mmol/L   Potassium 4.1 3.5 - 5.1 mmol/L   Chloride 103 101 - 111 mmol/L   CO2 27 22 - 32 mmol/L   Glucose, Bld 105 (H) 65 - 99 mg/dL   BUN 16 6 - 20 mg/dL   Creatinine, Ser 0.51 0.44 - 1.00 mg/dL   Calcium 9.3 8.9 - 10.3 mg/dL   Total Protein 7.4 6.5 - 8.1 g/dL   Albumin 4.2 3.5 - 5.0 g/dL   AST 11 (L) 15 - 41 U/L   ALT 13 (L) 14 - 54 U/L   Alkaline Phosphatase 83 38 - 126 U/L   Total Bilirubin 1.0 0.3 - 1.2 mg/dL   GFR calc non Af Amer >60 >60 mL/min   GFR calc Af Amer >60 >60 mL/min    Comment: (NOTE) The eGFR has been calculated using the CKD EPI equation. This calculation has not been validated in all clinical situations. eGFR's persistently <60 mL/min signify possible Chronic Kidney Disease.    Anion gap 10 5 - 15  Hemoglobin A1c     Status: None   Collection Time: 11/03/14  4:35 PM  Result Value Ref Range   Hgb A1c MFr Bld 5.2 4.0 - 6.0 %  Lipid panel, fasting     Status: Abnormal   Collection Time: 11/03/14  4:35 PM  Result Value Ref Range   Cholesterol 236 (H)  0 - 200 mg/dL   Triglycerides 76 <150 mg/dL   HDL 66 >40 mg/dL   Total CHOL/HDL Ratio 3.6 RATIO   VLDL 15 0 - 40 mg/dL   LDL Cholesterol 155 (H) 0 - 99 mg/dL    Comment:        Total Cholesterol/HDL:CHD Risk Coronary Heart Disease Risk Table                     Men   Women  1/2 Average Risk   3.4   3.3  Average Risk       5.0   4.4  2 X Average Risk   9.6   7.1  3 X Average Risk  23.4   11.0        Use the calculated  Patient Ratio above and the CHD Risk Table to determine the patient's CHD Risk.        ATP III CLASSIFICATION (LDL):  <100     mg/dL   Optimal  100-129  mg/dL   Near or Above                    Optimal  130-159  mg/dL   Borderline  160-189  mg/dL   High  >190     mg/dL   Very High   TSH     Status: None   Collection Time: 11/03/14  4:35 PM  Result Value Ref Range   TSH 2.669 0.350 - 4.500 uIU/mL    Physical Findings: AIMS: Facial and Oral Movements Muscles of Facial Expression: None, normal Lips and Perioral Area: None, normal Jaw: None, normal Tongue: None, normal,Extremity Movements Upper (arms, wrists, hands, fingers): None, normal Lower (legs, knees, ankles, toes): None, normal, Trunk Movements Neck, shoulders, hips: None, normal, Overall Severity Severity of abnormal movements (highest score from questions above): None, normal Incapacitation due to abnormal movements: None, normal Patient's awareness of abnormal movements (rate only patient's report): No Awareness, Dental Status Current problems with teeth and/or dentures?: No Does patient usually wear dentures?: No  CIWA:  CIWA-Ar Total: 0 COWS:     Treatment Plan Summary: Daily contact with patient to assess and evaluate symptoms and progress in treatment, Medication management and Plan I am going to restart her Cymbalta for her depression. I will not restart the lithium but we agree that discontinuing the Cymbalta may be causing a rapid worsening of her anxiety and depression. Reassurance and supportive therapy. Reviewed all of her labs which are looking all normal with no need for other treatment based on that. ECT is planned for Wednesday. Continue daily monitoring for treatment up until that point. Patient agrees with the plan.   Medical Decision Making:  New problem, with additional work up planned, Review of Psycho-Social Stressors (1), Review or order medicine tests (1), Review of Medication Regimen & Side Effects  (2) and Review of New Medication or Change in Dosage (2)     Brittany [name] 11/04/2014, 3:33 PM

## 2014-11-04 NOTE — Progress Notes (Signed)
D) Patient pleasant and cooperative upon my assessment. Patient completed Self Inventory Assessment and rates depression as  9/10, patient rates hopeless feelings as 10 /10.  Patient denies SI/HI, denies A/V hallucinations.  Patient's affect is flat and mood is sad, flat and depressed.   Reports that her appetite is good.   A) Patient offered support and encouragement, patient encouraged to discuss feelings/concerns with staff. Patient verbalized understanding. Patient monitored Q15 minutes for safety. Patient met with MD  to discuss today's goals and plan of care.  R) Patient visible in milieu, she  attended groups today.  Patient come to dining room for meals and snacks. Patient appropriate with staff and peers.   Patient taking medications as ordered. Will continue to monitor.

## 2014-11-04 NOTE — Progress Notes (Signed)
D: Pt denies SI/HI/AVH. Pt is pleasant and cooperative. Pt stated hopes she can start ECT ASAP, she appears less anxious and he is interacting with peers and staff appropriately.  A: Pt was offered support and encouragement. Pt was given scheduled medications. Pt was encouraged to attend groups. Q 15 minute checks were done for safety.  R:Pt attends groups and interacts well with peers and staff. Pt is taking medication. Pt has no complaints.Pt receptive to treatment and safety maintained on unit.

## 2014-11-04 NOTE — Plan of Care (Signed)
Problem: Alteration in mood Goal: LTG-Patient reports reduction in suicidal thoughts (Patient reports reduction in suicidal thoughts and is able to verbalize a safety plan for whenever patient is feeling suicidal)  Outcome: Not Progressing Patient denies SI/HI, 15 minutes checks maintained, contracts for safety.

## 2014-11-04 NOTE — BHH Group Notes (Signed)
[place] LCSW Group Therapy  11/04/2014 2:27 PM  Type of Therapy:  Group Therapy  Participation Level:  Active  Participation Quality:  Appropriate, Attentive and Supportive  Affect:  Depressed and Tearful  Cognitive:  Alert and Appropriate  Insight:  Engaged  Engagement in Therapy:  Engaged  Modes of Intervention:  Discussion, Education, Socialization and Support  Summary of Progress/Problems: Communications: Patients identify how individuals communicate with one another appropriately and inappropriately. Patients will be guided to discuss their thoughts, feelings, and behaviors related to barriers when communicating. The group will process together ways to execute positive and appropriate communications. Sakura discussed her relationship with her sister and how she was able to set boundaries with her. She provided and example of how she was able to set boundaries and how she maintains them. She identified her support system as her family, therapist and doctor at [facility].   [place] MSW, [place]  11/04/2014, 2:27 PM
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
## 2014-11-04 NOTE — Plan of Care (Signed)
Problem: Diagnosis: Increased Risk For Suicide Attempt Goal: STG-Patient Will Report Suicidal Feelings to Staff Outcome: Progressing Patient agitated this am.  Patient expresses suicidal thoughts. Provided support to patient

## 2014-11-05 LAB — URINALYSIS COMPLETE WITH MICROSCOPIC (ARMC ONLY)
Bilirubin Urine: NEGATIVE
GLUCOSE, UA: NEGATIVE mg/dL
HGB URINE DIPSTICK: NEGATIVE
Ketones, ur: NEGATIVE mg/dL
LEUKOCYTES UA: NEGATIVE
NITRITE: NEGATIVE
Protein, ur: NEGATIVE mg/dL
Specific Gravity, Urine: 1.009 (ref 1.005–1.030)
pH: 6 (ref 5.0–8.0)

## 2014-11-05 NOTE — Progress Notes (Signed)
Continues to endorse depression.   Tearful and isolative.  Medication compliant.  Good appetite, maintaining personal care chores.  Safety maintained.

## 2014-11-05 NOTE — Progress Notes (Signed)
Patient ID: Brittany [name], female   DOB: 04/22/1965, 49 y.o.   MRN: [id number]  CSW spoke with Grant today. She endorse suicidal thoughts with plans such as hanging herself with the shower curtain or ripping the metal coverings on the light switches to cut herself. Pt contract for safety. Nursing was notified.   [place], MSW, [place] 11/05/2014 2:28 PM

## 2014-11-05 NOTE — BHH Group Notes (Signed)
[place] LCSW Group Therapy  11/05/2014 2:46 PM  Type of Therapy:  Group Therapy  Participation Level:  Minimal  Participation Quality:  Attentive  Affect:  Depressed  Cognitive:  Alert  Insight:  Improving  Engagement in Therapy:  Improving  Modes of Intervention:  Discussion, Education, Socialization and Support  Summary of Progress/Problems:Pts were asked to identify what balance means to them. They were encouraged to identify what throws them off balance and how to regain balance. Brittany [name] attended group and stayed the entire time. She states she is not having a good day. She states she feels very depressed today.   Wray Kearns, MSW, [place]  11/05/2014, 2:46 PM

## 2014-11-05 NOTE — Progress Notes (Signed)
D) Patient pleasant and cooperative upon my assessment. She rates her depression as 9/10, patient rates hopeless feelings as 10 /10. Patient denies SI/HI, denies A/V hallucinations. Patient's affect is appropriate and mood is sad, flat and depressed.  She reports that her appetite is good.   A) Patient offered support and encouragement, patient encouraged to discuss feelings/concerns with staff. Patient verbalized understanding. Patient monitored Q15 minutes for safety. Patient met with MD to discuss today's goals and plan of care.  R) Patient visible in milieu, she spent most off the evening talking with peers and on the phone. She attended groups today. Patient come to dining room for meals and snacks. Patient appropriate with staff and peers. Patient taking medications as ordered. Will continue to monitor.

## 2014-11-05 NOTE — Plan of Care (Signed)
Problem: Alteration in mood Goal: LTG-Patient reports reduction in suicidal thoughts (Patient reports reduction in suicidal thoughts and is able to verbalize a safety plan for whenever patient is feeling suicidal)  Outcome: Not Progressing Passive SI

## 2014-11-05 NOTE — Plan of Care (Signed)
Problem: Alteration in mood Goal: STG-Patient is able to discuss feelings and issues (Patient is able to discuss feelings and issues leading to depression)  Outcome: Progressing Patient was able to verbalize feelings to writer about her depression.

## 2014-11-05 NOTE — Progress Notes (Signed)
[facility] MD Progress Note  11/05/2014 11:54 PM Brittany [name]  MRN:  [id number] Subjective:  Follow-up for this patient with severe recurrent major depression who is being evaluated for ECT Principal Problem: MDD (major depressive disorder), recurrent severe, without psychosis Diagnosis:   Patient Active Problem List   Diagnosis Date Noted  . Major depressive disorder, recurrent, severe without psychotic features [F33.2]   . MDD (major depressive disorder) [F32.2] 10/30/2014  . MDD (major depressive disorder), recurrent severe, without psychosis [F33.2]   . Suicidal ideation [R45.851] 10/05/2014  . Depression [F32.9] 06/12/2014  . Rash [R21] 01/04/2014  . Normocytic anemia [D64.9] 01/04/2014  . DJD (degenerative joint disease) [M19.90] 01/04/2014  . Hypertension [I10] 01/04/2014  . Dyslipidemia [E78.5] 01/04/2014  . GERD (gastroesophageal reflux disease) [K21.9] 01/04/2014  . Obstructive sleep apnea [G47.33] 01/04/2014  . 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 Abdominal abscess [K65.1] 01/01/2014  . Wound infection after surgery [T81.4XXA] 01/01/2014  . PTSD (post-traumatic stress disorder) [F43.10] 11/23/2013  . Severe recurrent major depression without psychotic features [F33.2] 11/22/2013  . 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 Morbid obesity, Weight - 312, BMI - 45.2 [E66.01] 04/21/2012   Total Time spent with patient: 45 minutes   Past Medical History:  Past Medical History  Diagnosis Date  . Migraine   . Hypertension   . Hypercholesterolemia   . Depression   . Morbid obesity   . Sleep apnea     no CPAP machine   . H/O hiatal hernia   . GERD (gastroesophageal reflux disease)     hx of   . Arthritis     hips, knees and hands   . Anxiety   . PTSD (post-traumatic stress disorder)     Past Surgical History  Procedure Laterality Date  . Foot surgery    . Breath tek h pylori  05/03/2012    Procedure: BREATH TEK H PYLORI;   Surgeon: [name], MD;  Location: [name];  Service: General;  Laterality: N/A;  . Right tube and ovary removed     . Abdominal hysterectomy  1997 & 2006  . Colonoscopy      hx of benign polyps   . 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 Upper gi endoscopy N/A 07/05/2012    Procedure: UPPER GI ENDOSCOPY;  Surgeon: [name], MD;  Location: [facility];  Service: General;  Laterality: N/A;  . Laparoscopic lysis of adhesions N/A 07/05/2012    Procedure: LAPAROSCOPIC LYSIS OF ADHESIONS;  Surgeon: [name], MD;  Location: [facility];  Service: General;  Laterality: N/A;  repair of abdominal wall hernia  . Irrigation and debridement abscess N/A 01/01/2014    Procedure: IRRIGATION AND DEBRIDEMENT ABSCESS;  Surgeon: [name], MD;  Location: [facility];  Service: General;  Laterality: N/A;  . Gastric bypass     Family History:  Family History  Problem Relation Age of Onset  . Cancer Mother     colon  . Cancer Maternal Aunt     breast  . Depression Father   . Depression Sister   . Post-traumatic stress disorder Sister   . Alcohol abuse Paternal Uncle   . Bipolar disorder Cousin   . Anxiety disorder Sister    Social History:  History  Alcohol Use No    Comment: Occasional use  History  Drug Use No    History   Social History  . Marital Status: Married    Spouse Name: N/A  . Number of Children: N/A  . Years of Education: N/A   Social History Main Topics  . Smoking status: Former Smoker    Types: Cigars    Quit date: 04/27/2004  . Smokeless tobacco: Never Used  . Alcohol Use: No     Comment: Occasional use  . Drug Use: No  . Sexual Activity: Yes   Other Topics Concern  . None   Social History Narrative   Additional History:   still depressed today. Had trouble sleeping last night. Crying spells today. Persistent suicidal  ideation. Affect is blunted and dysphoric. Patient continues to look forward to ECT to begin Wednesday. Does not feel comfortable with discharge prior to that.  Sleep: Fair  Appetite:  Poor   Assessment: Slightly worse today. Anxious. Possibly related to rapid discontinuation of antidepressives.  Musculoskeletal: Strength & Muscle Tone: within normal limits Gait & Station: normal Patient leans: N/A   Psychiatric Specialty Exam: Physical Exam  Constitutional: She appears well-developed and well-nourished.  HENT:  Head: Normocephalic and atraumatic.  Eyes: Conjunctivae are normal. Pupils are equal, round, and reactive to light.  Neck: Normal range of motion.  Cardiovascular: Normal heart sounds.   Respiratory: Effort normal.  GI: Soft.  Musculoskeletal: Normal range of motion.  Neurological: She is alert.  Skin: Skin is warm and dry.  Psychiatric: Her mood appears anxious. Her speech is delayed. She is agitated. Cognition and memory are normal. She expresses impulsivity. She exhibits a depressed mood. She expresses suicidal ideation.    Review of Systems  Constitutional: Negative.   HENT: Negative.   Eyes: Negative.   Respiratory: Negative.   Cardiovascular: Negative.   Gastrointestinal: Negative.   Musculoskeletal: Negative.   Skin: Negative.   Neurological: Negative.   Psychiatric/Behavioral: Positive for depression and suicidal ideas. Negative for hallucinations and substance abuse. The patient is nervous/anxious.     Blood pressure 125/85, pulse 69, temperature 98.2 F (36.8 C), temperature source Oral, resp. rate 20, height 5\' 10"  (1.778 m), weight 91.627 kg (202 lb), SpO2 99 %.Body mass index is 28.98 kg/(m^2).  General Appearance: Casual  Eye Contact::  Fair  Speech:  Normal Rate  Volume:  Decreased  Mood:  Depressed  Affect:  Depressed  Thought Process:  Irrelevant  Orientation:  Full (Time, Place, and Person)  Thought Content:  Negative  Suicidal Thoughts:   Yes.  without intent/plan  Homicidal Thoughts:  No  Memory:  Immediate;   Good Recent;   Good Remote;   Good  Judgement:  Intact  Insight:  Present  Psychomotor Activity:  Restlessness  Concentration:  Fair  Recall:  [place] of Knowledge:Fair  Language: Good  Akathisia:  No  Handed:  Right  AIMS (if indicated):     Assets:  Communication Skills Desire for Improvement Housing Resilience Social Support  ADL's:  Intact  Cognition: WNL  Sleep:  Number of Hours: 7.25     Current Medications: Current Facility-Administered Medications  Medication Dose Route Frequency Provider Last Rate Last Dose  . acetaminophen (TYLENOL) tablet 650 mg  650 mg Oral Q6H PRN Gonzella Lex, MD      . alum & mag hydroxide-simeth (MAALOX/MYLANTA) 200-200-20 MG/5ML suspension 30 mL  30 mL Oral Q4H PRN Gonzella Lex, MD      . cholecalciferol (VITAMIN D) tablet 1,000 Units  1,000 Units  Oral Daily Gonzella Lex, MD   1,000 Units at 11/05/14 [phone]  . clonazePAM (KLONOPIN) tablet 1 mg  1 mg Oral Daily Gonzella Lex, MD   1 mg at 11/05/14 0907  . DULoxetine (CYMBALTA) DR capsule 60 mg  60 mg Oral Daily Gonzella Lex, MD   60 mg at 11/05/14 3500  . hydrOXYzine (ATARAX/VISTARIL) tablet 25 mg  25 mg Oral TID Gonzella Lex, MD   25 mg at 11/05/14 2112  . loratadine (CLARITIN) tablet 10 mg  10 mg Oral Daily Gonzella Lex, MD   10 mg at 11/05/14 0907  . LORazepam (ATIVAN) tablet 1 mg  1 mg Oral Q4H PRN Gonzella Lex, MD      . magnesium hydroxide (MILK OF MAGNESIA) suspension 30 mL  30 mL Oral Daily PRN Gonzella Lex, MD      . multivitamin with minerals tablet 1 tablet  1 tablet Oral Daily Gonzella Lex, MD   1 tablet at 11/05/14 [phone]  . QUEtiapine (SEROQUEL) tablet 100 mg  100 mg Oral QHS Gonzella Lex, MD   100 mg at 11/05/14 2112  . traZODone (DESYREL) tablet 50 mg  50 mg Oral QHS PRN Gonzella Lex, MD        Lab Results:  Results for orders placed or performed during the hospital encounter of  11/02/14 (from the past 48 hour(s))  Urinalysis complete, with microscopic ([facility] only)     Status: Abnormal   Collection Time: 11/05/14  4:14 PM  Result Value Ref Range   Color, Urine YELLOW (A) YELLOW   APPearance CLEAR (A) CLEAR   Glucose, UA NEGATIVE NEGATIVE mg/dL   Bilirubin Urine NEGATIVE NEGATIVE   Ketones, ur NEGATIVE NEGATIVE mg/dL   Specific Gravity, Urine 1.009 1.005 - 1.030   Hgb urine dipstick NEGATIVE NEGATIVE   pH 6.0 5.0 - 8.0   Protein, ur NEGATIVE NEGATIVE mg/dL   Nitrite NEGATIVE NEGATIVE   Leukocytes, UA NEGATIVE NEGATIVE   RBC / HPF 0-5 0 - 5 RBC/hpf   WBC, UA 0-5 0 - 5 WBC/hpf   Bacteria, UA RARE (A) NONE SEEN   Squamous Epithelial / LPF 0-5 (A) NONE SEEN   Mucous PRESENT     Physical Findings: AIMS: Facial and Oral Movements Muscles of Facial Expression: None, normal Lips and Perioral Area: None, normal Jaw: None, normal Tongue: None, normal,Extremity Movements Upper (arms, wrists, hands, fingers): None, normal Lower (legs, knees, ankles, toes): None, normal, Trunk Movements Neck, shoulders, hips: None, normal, Overall Severity Severity of abnormal movements (highest score from questions above): None, normal Incapacitation due to abnormal movements: None, normal Patient's awareness of abnormal movements (rate only patient's report): No Awareness, Dental Status Current problems with teeth and/or dentures?: No Does patient usually wear dentures?: No  CIWA:  CIWA-Ar Total: 0 COWS:     Treatment Plan Summary: Daily contact with patient to assess and evaluate symptoms and progress in treatment, Medication management and Plan I am going to restart her Cymbalta for her depression. I will not restart the lithium but we agree that discontinuing the Cymbalta may be causing a rapid worsening of her anxiety and depression. Reassurance and supportive therapy. Reviewed all of her labs which are looking all normal with no need for other treatment based on that. ECT is  planned for Wednesday. Continue daily monitoring for treatment up until that point. Patient agrees with the plan. feels a little bit better after restarting the Cymbalta. No other  change to treatment today.   Medical Decision Making:  New problem, with additional work up planned, Review of Psycho-Social Stressors (1), Review or order medicine tests (1), Review of Medication Regimen & Side Effects (2) and Review of New Medication or Change in Dosage (2)     Darrel Baroni 11/05/2014, 11:54 PM

## 2014-11-06 NOTE — Plan of Care (Signed)
Problem: Alteration in mood Goal: LTG-Patient reports reduction in suicidal thoughts (Patient reports reduction in suicidal thoughts and is able to verbalize a safety plan for whenever patient is feeling suicidal)  Outcome: Not Progressing Continues to have active suicidal thoughts.  Verbalized "I can find several things in my room that I could use.  The shower curtains and the electrical outlets"

## 2014-11-06 NOTE — Progress Notes (Signed)
D: Patient endorses SI but agrees to contract.  Patient affect and mood are depressed.  Patient did attend evening group. Patient visible on the milieu. No distress noted. A: Support and encouragement offered. Scheduled medications given to pt. Q 15 min checks continued for patient safety. R: Patient receptive. Patient remains safe on the unit.

## 2014-11-06 NOTE — Progress Notes (Signed)
Recreation Therapy Notes  Date: 07.25.16 Time: 3:00 pm Location: Craft Room  Group Topic: Self-expression  Goal Area(s) Addresses:  Patient will effectively use art as a means of self-expression. Patient will recognize positive benefit of self-expression. Patient will be able to identify one emotion experienced during group session. Patient will identify use of art/self-expression as a coping skill.  Behavioral Response: Did not attend  Intervention: Two Faces of Me  Activity: Patients were given a blank face worksheet and instructed to draw a line down the middle. On one side they were instructed to draw or write how they felt when they were admitted to the hospital. On the other side they were instructed to draw or write how they want to feel when they are d/c from the hospital.  Education: LRT educated patients on different forms of self-expression.  Education Outcome: Patient did not attend group.  Clinical Observations/Feedback: Patient did not attend group.  Leonette Monarch, LRT/CTRS 11/06/2014 4:06 PM

## 2014-11-06 NOTE — [facility] Group Notes (Signed)
[place] Group Notes:  (Nursing/MHT/Case Management/Adjunct)  Date:  11/06/2014  Time:  1:42 PM  Type of Therapy:  Psychoeducational Skills  Participation Level:  Active  Participation Quality:  Appropriate, Attentive and Sharing  Affect:  Appropriate  Cognitive:  Alert and Appropriate  Insight:  Appropriate  Engagement in Group:  Engaged  Modes of Intervention:  Discussion, Education and Support  Summary of Progress/Problems:  Brittany [name] 11/06/2014, 1:42 PM

## 2014-11-06 NOTE — [facility] Group Notes (Signed)
[facility] LCSW Group Therapy  11/06/2014 3:52 PM  Type of Therapy:  Group Therapy  Participation Level:  Did Not Attend   Keene Breath, MSW, LCSWA 11/06/2014, 3:52 PM

## 2014-11-06 NOTE — Plan of Care (Signed)
Problem: Alteration in mood Goal: LTG-Pt's behavior demonstrates decreased signs of depression 7/22: Goal not met. Patient transferred to [facility] for ECT treatments.   Tilden Fossa, MSW, LCSWA Clinical Social Worker [facility] [phone]   Outcome: Not Progressing Continues to endorse depression.  Tearful.

## 2014-11-06 NOTE — Progress Notes (Signed)
Continues to have passive SI.  Has remained to self in her room in bed sleeping.  Only out of room for medications and for meals.

## 2014-11-06 NOTE — Plan of Care (Signed)
Problem: Diagnosis: Increased Risk For Suicide Attempt Goal: LTG-Patient Will Report Improved Mood and Deny Suicidal LTG (by discharge) Patient will report improved mood and deny suicidal ideation.  Outcome: Not Progressing Continues to have active thoughts of suicide.

## 2014-11-06 NOTE — Plan of Care (Signed)
Problem: [facility] Participation in Recreation Therapeutic Interventions Goal: STG-Patient will demonstrate improved self esteem by identif STG: Self-Esteem - Within 10 treatment sessions, patient will verbalize at least 5 positive affirmation statements in each of 8 treatment sessions to increase self-esteem post d/c.  Outcome: Progressing Treatment Session 1; Completed 1 out of 8: At approximately 12:50 pm, LRT met with patient in craft room. Patient verbalized 5 positive affirmation statements. Patient reported it felt "Good, but a little strange because I am not used to being positive". LRT encouraged patient to continue saying positive affirmation statements.  Leonette Monarch, LRT/CTRS 07.25.16 2;36 pm Goal: STG-Other Recreation Therapy Goal (Specify) STG: Stress Management - Within 7 treatment sessions, patient will demonstrate at least one stress management technique in each of 2 treatment sessions to increase stress management skills post d/c.  Outcome: Progressing Treatment Session 1; Completed 0 out of 2: At approximately 12:50 pm, LRT met with patient in craft room. LRT educated and provided patient with handouts on stress management techniques. Patient verbalized understanding. LRT encouraged patient to read over and practice the stress management techniques.  Leonette Monarch, LRT/CTRS 07.25.16 2:39 pm

## 2014-11-07 NOTE — [facility] Group Notes (Signed)
[place] Group Notes:  (Nursing/MHT/Case Management/Adjunct)  Date:  11/07/2014  Time:  2:19 PM  Type of Therapy:  Psychoeducational Skills  Participation Level:  Did Not Attend  Leonie Douglas 11/07/2014, 2:19 PM

## 2014-11-07 NOTE — Progress Notes (Signed)
[facility] MD Progress Note  11/07/2014 9:58 PM Brittany [name]  MRN:  [id number] Subjective:  Follow-up for this patient with severe recurrent major depression who is being evaluated for ECT Principal Problem: MDD (major depressive disorder), recurrent severe, without psychosis Diagnosis:   Patient Active Problem List   Diagnosis Date Noted  . Major depressive disorder, recurrent, severe without psychotic features [F33.2]   . MDD (major depressive disorder) [F32.2] 10/30/2014  . MDD (major depressive disorder), recurrent severe, without psychosis [F33.2]   . Suicidal ideation [R45.851] 10/05/2014  . Depression [F32.9] 06/12/2014  . Rash [R21] 01/04/2014  . Normocytic anemia [D64.9] 01/04/2014  . DJD (degenerative joint disease) [M19.90] 01/04/2014  . Hypertension [I10] 01/04/2014  . Dyslipidemia [E78.5] 01/04/2014  . GERD (gastroesophageal reflux disease) [K21.9] 01/04/2014  . Obstructive sleep apnea [G47.33] 01/04/2014  . 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 Abdominal abscess [K65.1] 01/01/2014  . Wound infection after surgery [T81.4XXA] 01/01/2014  . PTSD (post-traumatic stress disorder) [F43.10] 11/23/2013  . Severe recurrent major depression without psychotic features [F33.2] 11/22/2013  . 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 Morbid obesity, Weight - 312, BMI - 45.2 [E66.01] 04/21/2012   Total Time spent with patient: 45 minutes   Past Medical History:  Past Medical History  Diagnosis Date  . Migraine   . Hypertension   . Hypercholesterolemia   . Depression   . Morbid obesity   . Sleep apnea     no CPAP machine   . H/O hiatal hernia   . GERD (gastroesophageal reflux disease)     hx of   . Arthritis     hips, knees and hands   . Anxiety   . PTSD (post-traumatic stress disorder)     Past Surgical History  Procedure Laterality Date  . Foot surgery    . Breath tek h pylori  05/03/2012    Procedure: BREATH TEK H PYLORI;   Surgeon: [name], MD;  Location: [name];  Service: General;  Laterality: N/A;  . Right tube and ovary removed     . Abdominal hysterectomy  1997 & 2006  . Colonoscopy      hx of benign polyps   . 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 Upper gi endoscopy N/A 07/05/2012    Procedure: UPPER GI ENDOSCOPY;  Surgeon: [name], MD;  Location: [facility];  Service: General;  Laterality: N/A;  . Laparoscopic lysis of adhesions N/A 07/05/2012    Procedure: LAPAROSCOPIC LYSIS OF ADHESIONS;  Surgeon: [name], MD;  Location: [facility];  Service: General;  Laterality: N/A;  repair of abdominal wall hernia  . Irrigation and debridement abscess N/A 01/01/2014    Procedure: IRRIGATION AND DEBRIDEMENT ABSCESS;  Surgeon: [name], MD;  Location: [facility];  Service: General;  Laterality: N/A;  . Gastric bypass     Family History:  Family History  Problem Relation Age of Onset  . Cancer Mother     colon  . Cancer Maternal Aunt     breast  . Depression Father   . Depression Sister   . Post-traumatic stress disorder Sister   . Alcohol abuse Paternal Uncle   . Bipolar disorder Cousin   . Anxiety disorder Sister    Social History:  History  Alcohol Use No    Comment: Occasional use  History  Drug Use No    History   Social History  . Marital Status: Married    Spouse Name: N/A  . Number of Children: N/A  . Years of Education: N/A   Social History Main Topics  . Smoking status: Former Smoker    Types: Cigars    Quit date: 04/27/2004  . Smokeless tobacco: Never Used  . Alcohol Use: No     Comment: Occasional use  . Drug Use: No  . Sexual Activity: Yes   Other Topics Concern  . None   Social History Narrative   Additional History:   still depressed today. Had trouble sleeping last night. Crying spells today. Persistent suicidal  ideation. Affect is blunted and dysphoric. Patient continues to look forward to ECT to begin Wednesday. Does not feel comfortable with discharge prior to that.  Sleep: Fair  Appetite:  Poor   Assessment: improved but still depressed. Frustrated with inpatient treatment. No suicidal behavior. No psychosis. Tolerating medication  Musculoskeletal: Strength & Muscle Tone: within normal limits Gait & Station: normal Patient leans: N/A   Psychiatric Specialty Exam: Physical Exam  Constitutional: She appears well-developed and well-nourished.  HENT:  Head: Normocephalic and atraumatic.  Eyes: Conjunctivae are normal. Pupils are equal, round, and reactive to light.  Neck: Normal range of motion.  Cardiovascular: Normal heart sounds.   Respiratory: Effort normal.  GI: Soft.  Musculoskeletal: Normal range of motion.  Neurological: She is alert.  Skin: Skin is warm and dry.  Psychiatric: Her mood appears anxious. Her speech is delayed. She is agitated. Cognition and memory are normal. She expresses impulsivity. She exhibits a depressed mood. She expresses suicidal ideation.    Review of Systems  Constitutional: Negative.   HENT: Negative.   Eyes: Negative.   Respiratory: Negative.   Cardiovascular: Negative.   Gastrointestinal: Negative.   Musculoskeletal: Negative.   Skin: Negative.   Neurological: Negative.   Psychiatric/Behavioral: Positive for depression and suicidal ideas. Negative for hallucinations and substance abuse. The patient is nervous/anxious.     Blood pressure 109/71, pulse 80, temperature 98.9 F (37.2 C), temperature source Oral, resp. rate 20, height 5\' 10"  (1.778 m), weight 91.627 kg (202 lb), SpO2 99 %.Body mass index is 28.98 kg/(m^2).  General Appearance: Casual  Eye Contact::  Fair  Speech:  Normal Rate  Volume:  Decreased  Mood:  Depressed  Affect:  Depressed  Thought Process:  Irrelevant  Orientation:  Full (Time, Place, and Person)  Thought Content:   Negative  Suicidal Thoughts:  Yes.  without intent/plan  Homicidal Thoughts:  No  Memory:  Immediate;   Good Recent;   Good Remote;   Good  Judgement:  Intact  Insight:  Present  Psychomotor Activity:  Restlessness  Concentration:  Fair  Recall:  [place] of Knowledge:Fair  Language: Good  Akathisia:  No  Handed:  Right  AIMS (if indicated):     Assets:  Communication Skills Desire for Improvement Housing Resilience Social Support  ADL's:  Intact  Cognition: WNL  Sleep:  Number of Hours: 8.25     Current Medications: Current Facility-Administered Medications  Medication Dose Route Frequency Provider Last Rate Last Dose  . acetaminophen (TYLENOL) tablet 650 mg  650 mg Oral Q6H PRN Gonzella Lex, MD      . alum & mag hydroxide-simeth (MAALOX/MYLANTA) 200-200-20 MG/5ML suspension 30 mL  30 mL Oral Q4H PRN Gonzella Lex, MD      . cholecalciferol (VITAMIN D) tablet 1,000  Units  1,000 Units Oral Daily Gonzella Lex, MD   1,000 Units at 11/07/14 [phone]  . clonazePAM (KLONOPIN) tablet 1 mg  1 mg Oral Daily Gonzella Lex, MD   1 mg at 11/07/14 9233  . DULoxetine (CYMBALTA) DR capsule 60 mg  60 mg Oral Daily Gonzella Lex, MD   60 mg at 11/07/14 0076  . hydrOXYzine (ATARAX/VISTARIL) tablet 25 mg  25 mg Oral TID Gonzella Lex, MD   25 mg at 11/07/14 2150  . loratadine (CLARITIN) tablet 10 mg  10 mg Oral Daily Gonzella Lex, MD   10 mg at 11/07/14 2263  . LORazepam (ATIVAN) tablet 1 mg  1 mg Oral Q4H PRN Gonzella Lex, MD      . magnesium hydroxide (MILK OF MAGNESIA) suspension 30 mL  30 mL Oral Daily PRN Gonzella Lex, MD      . multivitamin with minerals tablet 1 tablet  1 tablet Oral Daily Gonzella Lex, MD   1 tablet at 11/07/14 [phone]  . QUEtiapine (SEROQUEL) tablet 100 mg  100 mg Oral QHS Gonzella Lex, MD   100 mg at 11/07/14 2150  . traZODone (DESYREL) tablet 50 mg  50 mg Oral QHS PRN Gonzella Lex, MD   50 mg at 11/07/14 2150    Lab Results:  No results found for  this or any previous visit (from the past 48 hour(s)).  Physical Findings: AIMS: Facial and Oral Movements Muscles of Facial Expression: None, normal Lips and Perioral Area: None, normal Jaw: None, normal Tongue: None, normal,Extremity Movements Upper (arms, wrists, hands, fingers): None, normal Lower (legs, knees, ankles, toes): None, normal, Trunk Movements Neck, shoulders, hips: None, normal, Overall Severity Severity of abnormal movements (highest score from questions above): None, normal Incapacitation due to abnormal movements: None, normal Patient's awareness of abnormal movements (rate only patient's report): No Awareness, Dental Status Current problems with teeth and/or dentures?: No Does patient usually wear dentures?: No  CIWA:  CIWA-Ar Total: 0 COWS:     Treatment Plan Summary: Daily contact with patient to assess and evaluate symptoms and progress in treatment, Medication management and Plan I am going to restart her Cymbalta for her depression. I will not restart the lithium but we agree that discontinuing the Cymbalta may be causing a rapid worsening of her anxiety and depression. Reassurance and supportive therapy. Reviewed all of her labs which are looking all normal with no need for other treatment based on that. ECT is planned for Wednesday. Continue daily monitoring for treatment up until that point. Patient agrees with the plan. feels a little bit better after restarting the Cymbalta. No other change to treatment today.  Still derpessed but feels she will be safe if discharged home. Plan for discharge tomorrow after ECT. ECT in the morning. Orders done   Medical Decision Making:  New problem, with additional work up planned, Review of Psycho-Social Stressors (1), Review or order medicine tests (1), Review of Medication Regimen & Side Effects (2) and Review of New Medication or Change in Dosage (2)     John Clapacs 11/07/2014, 9:58 PM

## 2014-11-07 NOTE — Plan of Care (Signed)
Problem: Alteration in mood Goal: STG-Patient reports thoughts of self-harm to staff Outcome: Progressing Pt denies SI.  Problem: Alteration in mood; excessive anxiety as evidenced by: Goal: STG-Pt will report an absence of self-harm thoughts/actions (Patient will report an absence of self-harm thoughts or actions)  Outcome: Progressing Denies SI

## 2014-11-07 NOTE — Progress Notes (Signed)
Recreation Therapy Notes  At approximately 10:40 am, LRT attempted treatment session. Patient reported she was not feeling well and requested for LRT to come bach tomorrow. LRT will attempt treatment session tomorrow.  Leonette Monarch, LRT/CTRS 11/07/2014 12:24 PM

## 2014-11-07 NOTE — Progress Notes (Signed)
Recreation Therapy Notes  Date: 07.26.16 Time: 3:00 pm Location: Craft Room  Group Topic: Coping Skills  Goal Area(s) Addresses:  Patient will verbalize an emotion experienced during group. Patient will verbalize benefit of using art as a coping skill.  Behavioral Response: Did not attend  Intervention: Coloring  Activity: Patients were given coloring sheets and instructed to color.   Education: LRT educated patients on benefit of using art as a Technical sales engineer.  Education Outcome: Patient did not attend group.  Clinical Observations/Feedback: Patient did not attend group.  Leonette Monarch, LRT/CTRS 11/07/2014 4:27 PM

## 2014-11-07 NOTE — BHH Group Notes (Signed)
[facility] LCSW Aftercare Discharge Planning Group Note  11/07/2014 7:56 AM  Participation Quality:  Intrusive  Affect:  Defensive  Cognitive:  Appropriate  Insight:  Developing/Improving  Engagement in Group:  Developing/Improving  Modes of Intervention:  Discussion, Education and Support  Summary of Progress/Problems:Patient goal for today was to reduce her serious negative thoughts,they are with her all the time.  Enis Slipper M 11/07/2014, 7:56 AM

## 2014-11-07 NOTE — Progress Notes (Signed)
[place] LCSW Group Therapy  11/07/2014 4:20 PM  Type of Therapy:  Group Therapy  Participation Level:  Active  Participation Quality:  Attentive  Affect:  Appropriate  Cognitive:  Appropriate  Insight:  Developing/Improving  Engagement in Therapy:  Engaged  Modes of Intervention:  Discussion, Exploration, Problem-solving and Support  Summary of Progress/Problems:LCSW reviewed group rules with patients. Today's topic involves feelings about diagnosis. Patient was able to express 2 symptoms they experience and offer coping skills to their peers when symptoms become problematic. Examples discussed is having the insight and understanding when to get help and to take medications daily, talk therapy, groups that support you. This patient was able to relate her challenges with her disease alcoholism. She reported some very challenging times in her life and how she hopes to overcome her alcoholism.    [place] 11/07/2014, 4:20 PM

## 2014-11-07 NOTE — Progress Notes (Signed)
D: Patient denies SI/HI/AVH. Patient isolative flat and depressed.  Patient did attend evening group. Patient visible on the milieu. No distress noted. A: Support and encouragement offered. Scheduled medications given to pt. Q 15 min checks continued for patient safety. R: Patient receptive. Patient remains safe on the unit.

## 2014-11-07 NOTE — Progress Notes (Signed)
D. Patient presents with flat affect. Denies SI. Pt angered during group session and came to room and slammed door. Patient states the group leaders said it was there fault there needs weren't being met. ECT planned for tomorrow. A: Support and encouragement offered.  R: Patient receptive. States wants to have ECT treatment and discharge home. Pt states cannot get better in place like this. Safety maintained on unit.

## 2014-11-08 ENCOUNTER — Inpatient Hospital Stay: Payer: 59 | Admitting: Certified Registered Nurse Anesthetist

## 2014-11-08 ENCOUNTER — Inpatient Hospital Stay (HOSPITAL_COMMUNITY)
Admission: EM | Admit: 2014-11-08 | Discharge: 2014-11-08 | Disposition: A | Discharge status: Home / Self Care | Payer: 59 | Attending: Psychiatry | Admitting: Psychiatry

## 2014-11-08 DIAGNOSIS — F332 Major depressive disorder, recurrent severe without psychotic features: Secondary | ICD-10-CM

## 2014-11-08 MED ORDER — LIDOCAINE HCL (CARDIAC) 20 MG/ML IV SOLN
4.0000 mg | Freq: Once | INTRAVENOUS | Status: AC
  Administered 2014-11-08: 4 mg via INTRAVENOUS

## 2014-11-08 MED ORDER — DEXTROSE IN LACTATED RINGERS 5 % IV SOLN
INTRAVENOUS | Status: DC | PRN
  Administered 2014-11-08: 10:00:00 via INTRAVENOUS

## 2014-11-08 MED ORDER — SUCCINYLCHOLINE CHLORIDE 20 MG/ML IJ SOLN
90.0000 mg | Freq: Once | INTRAMUSCULAR | Status: AC
  Administered 2014-11-08: 90 mg via INTRAVENOUS

## 2014-11-08 MED ORDER — FENTANYL CITRATE (PF) 100 MCG/2ML IJ SOLN: 25.0000 ug | INTRAMUSCULAR | Status: DC | PRN

## 2014-11-08 MED ORDER — METHOHEXITAL SODIUM 100 MG/10ML IV SOSY
90.0000 mg | PREFILLED_SYRINGE | Freq: Once | INTRAVENOUS | Status: AC
  Administered 2014-11-08: 90 mg via INTRAVENOUS

## 2014-11-08 MED ORDER — DULOXETINE HCL 60 MG PO CPEP: 60.0000 mg | ORAL_CAPSULE | Freq: Every day | ORAL | Status: DC

## 2014-11-08 MED ORDER — ONDANSETRON HCL 4 MG/2ML IJ SOLN
4.0000 mg | Freq: Once | INTRAMUSCULAR | Status: AC | PRN
Start: 2014-11-08 — End: 2014-11-08

## 2014-11-08 MED ORDER — DEXTROSE 5 % IV SOLN
250.0000 mL | Freq: Once | INTRAVENOUS | Status: AC
  Administered 2014-11-08: 250 mL via INTRAVENOUS

## 2014-11-08 NOTE — Anesthesia Postprocedure Evaluation (Signed)
  Anesthesia Post-op Note  Patient: Brittany [name]  Procedure(s) Performed: * No procedures listed *  Anesthesia type:General  Patient location: PACU  Post pain: Pain level controlled  Post assessment: Post-op Vital signs reviewed, Patient's Cardiovascular Status Stable, Respiratory Function Stable, Patent Airway and No signs of Nausea or vomiting  Post vital signs: Reviewed and stable  Last Vitals:  Filed Vitals:   11/08/14 1100  BP: 125/82  Pulse: 71  Temp: 36.7 C  Resp: 25    Level of consciousness: awake, alert  and patient cooperative  Complications: No apparent anesthesia complications

## 2014-11-08 NOTE — Anesthesia Preprocedure Evaluation (Signed)
Anesthesia Evaluation  Patient identified by MRN, date of birth, ID band Patient awake    Reviewed: Allergy & Precautions, NPO status   Airway Mallampati: II  TM Distance: >3 FB Neck ROM: Full    Dental  (+) Caps   Pulmonary sleep apnea , former smoker,    Pulmonary exam normal       Cardiovascular hypertension, Normal cardiovascular exam    Neuro/Psych  Headaches, Anxiety Depression    GI/Hepatic Neg liver ROS, hiatal hernia, GERD-  Medicated and Controlled,  Endo/Other  negative endocrine ROS  Renal/GU negative Renal ROS     Musculoskeletal  (+) Arthritis -, Osteoarthritis,    Abdominal Normal abdominal exam  (+)   Peds negative pediatric ROS (+)  Hematology negative hematology ROS (+) anemia ,   Anesthesia Other Findings Hysterectomy... Hx of morbid obesity in the past  Reproductive/Obstetrics                             Anesthesia Physical Anesthesia Plan  ASA: II  Anesthesia Plan: General   Post-op Pain Management:    Induction: Intravenous  Airway Management Planned: Simple Face Mask  Additional Equipment:   Intra-op Plan:   Post-operative Plan:   Informed Consent: I have reviewed the patients History and Physical, chart, labs and discussed the procedure including the risks, benefits and alternatives for the proposed anesthesia with the patient or authorized representative who has indicated his/her understanding and acceptance.   Dental advisory given  Plan Discussed with: CRNA and Surgeon  Anesthesia Plan Comments:         Anesthesia Quick Evaluation

## 2014-11-08 NOTE — Progress Notes (Signed)
Recreation Therapy Notes  INPATIENT RECREATION TR PLAN  Patient Details Name: Brittany [name] MRN: [id number] DOB: 1965/10/22 Today's Date: 11/08/2014  Rec Therapy Plan Is patient appropriate for Therapeutic Recreation?: Yes Treatment times per week: At least 2 times a week TR Treatment/Interventions: 1:1 session, Group participation (Comment) (Appropriate participation in daily recreation therapy tx)  Discharge Criteria Pt will be discharged from therapy if:: Discharged Treatment plan/goals/alternatives discussed and agreed upon by:: Patient/family  Discharge Summary Short term goals set: See Care Plan Short term goals met: Adequate for discharge Progress toward goals comments: One-to-one attended Which groups?: Coping skills, Self-esteem One-to-one attended: Self-esteem, stress management, anger management, coping skills Reason goals not met: Patient d/c before goals could be met Therapeutic equipment acquired: None Reason patient discharged from therapy: Discharge from hospital Pt/family agrees with progress & goals achieved: Yes Date patient discharged from therapy: 11/08/14   Leonette Monarch, LRT/CTRS 11/08/2014, 4:46 PM

## 2014-11-08 NOTE — Progress Notes (Signed)
Recreation Therapy Notes  Date: 07.27.16 Time: 3:00 pm Location: Craft Room  Group Topic: Self-esteem  Goal Area(s) Addresses:  Patient will identify positive attributes about self. Patient will identify at least one coping skill.  Behavioral Response: Attentive, Interactive  Intervention: All About Me  Activity: Patients were instructed to make an "All About Me" pamphlet with their life's motto, positive traits, coping skills, and their healthy support system.  Education: LRT educated patients on ways to increase their self-esteem.   Education Outcome: In group clarification offered  Clinical Observations/Feedback: Patient completed activity. Patient contributed to group discussion by stating that it was difficult to think of positive traits and why, that it was difficult to use healthy coping skills and why, that it is difficult to use her healthy support system and why.  Leonette Monarch, LRT/CTRS 11/08/2014 4:34 PM

## 2014-11-08 NOTE — Anesthesia Procedure Notes (Signed)
Performed by: Demetrius Charity Pre-anesthesia Checklist: Patient identified, Patient being monitored, Timeout performed, Emergency Drugs available and Suction available Patient Re-evaluated:Patient Re-evaluated prior to inductionOxygen Delivery Method: Circle system utilized Preoxygenation: Pre-oxygenation with 100% oxygen Intubation Type: IV induction Ventilation: Mask ventilation without difficulty Airway Equipment and Method: Bite block Dental Injury: Teeth and Oropharynx as per pre-operative assessment

## 2014-11-08 NOTE — BHH Group Notes (Signed)
[place] Group Notes:  (Nursing/MHT/Case Management/Adjunct)  Date:  11/08/2014  Time:  12:54 PM  Type of Therapy:  Psychoeducational Skills  Participation Level:  Did Not Attend   Adela Lank [facility] 11/08/2014, 12:54 PM

## 2014-11-08 NOTE — BHH Group Notes (Signed)
[place] LCSW Group Therapy  11/08/2014 4:56 PM  Type of Therapy:  Group Therapy  Participation Level:  Did Not Attend  Modes of Intervention:  Discussion, Education, Socialization and Support  Summary of Progress/Problems:Emotional Regulation: Patients will identify both negative and positive emotions. They will discuss emotions they have difficulty regulating and how they impact their lives. Patients will be asked to identify healthy coping skills to combat unhealthy reactions to negative emotions.     Mart MSW, [place]  11/08/2014, 4:56 PM

## 2014-11-08 NOTE — Plan of Care (Signed)
Problem: [facility] Participation in Recreation Therapeutic Interventions Goal: STG-Patient will demonstrate improved self esteem by identif STG: Self-Esteem - Within 10 treatment sessions, patient will verbalize at least 5 positive affirmation statements in each of 8 treatment sessions to increase self-esteem post d/c.  Outcome: Adequate for Discharge Treatment Session 3; Completed 2 out of 8: At approximately 3:40 pm, LRT met with patient in craft room. Patient verbalized 5 positive affirmation statements. Patient reported it felt "good to say that" but that it is "hard when in the depths of depression". LRT encouraged patient to continue saying positive affirmation statements.  Leonette Monarch, LRT/CTRS 07.27.16 4:39 pm Goal: STG-Patient will identify at least five coping skills for ** STG: Coping Skills - Within 4 treatment sessions, patient will verbalize at least 5 coping skills for anger in each of 2 treatment sessions to increase anger management skills post d/c.  Outcome: Adequate for Discharge Treatment Session 2; Completed 1 out of 2: At approximately 3:40 pm, LRT met with patient in craft room. Patient verbalized 5 coping skills for anger. Patient verbalized what triggers her to get angry, how her body responds to anger, and how she is going to remember to use her coping skills. LRT provided suggestions as well.  Leonette Monarch, LRT/CTRS 07.27.16 4:41 pm Goal: STG-Other Recreation Therapy Goal (Specify) STG: Stress Management - Within 7 treatment sessions, patient will demonstrate at least one stress management technique in each of 2 treatment sessions to increase stress management skills post d/c.  Outcome: Adequate for Discharge Treatment Session 3; Completed 0 out of 2; At approximately 3:40 pm, LRT met with patient in craft room. Patient reported she had not read over the stress management techniques because she had a fever yesterday and did not feel well. LRT encouraged patient to  read over and practice the stress management techniques.  Leonette Monarch, LRT/CTRS 07.27.16 4:42 pm  Problem: [facility] Participation in Recreation Therapeutic Interventions Goal: STG-Other Recreation Therapy Goal (Specify) STG: Coping Skills - Within 10 treatment sessions, patient will verbalize 5 new coping skills for depression in each of 5 treatment sessions to increase healthy coping skills post d/c.  Outcome: Adequate for Discharge Treatment Session 2; Completed 1 out of 5: At approximately 3:40 pm, LRT met with patient in craft room. Patient verbalized 6 coping skills and wrote down 3. LRT encouraged patient to continue finding coping skills.  Leonette Monarch, LRT/CTRS 07.27.16 4:44 pm

## 2014-11-08 NOTE — Procedures (Signed)
ECT SERVICES Physician's Interval Evaluation & Treatment Note  Patient Identification: Brittany [name] MRN:  [id number] Date of Evaluation:  11/08/2014 TX #: 1  MADRS:   MMSE:   P.E. Findings:  No significant findings. Vitals normal.  Psychiatric Interval Note:  Mood continues to be very depressed.  Subjective:  Patient is a 49 y.o. female seen for evaluation for Electroconvulsive Therapy. Patient feels that she will be safe with discharge home and will follow up as an outpatient  Treatment Summary:   [x]   Right Unilateral             []  Bilateral   % Energy : 0.3 ms 50%   Impedance: 1390 ohms  Seizure Energy Index: 28,551 V squared  Postictal Suppression Index: 98%  Seizure Concordance Index: 98%  Medications  Pre Shock: Xylocaine 4 mg, Brevital 90 mg, succinylcholine 90 mg  Post Shock: None  Seizure Duration: 27 seconds by EMG, 45 seconds by EEG   Comments: Patient will be discharged today and will follow-up with outpatient treatment beginning Friday. She is agreeable to the plan. Plan to increase the succinylcholine dose to 110 mg because of some excess movement. Plan to continue with current stimulus parameters.   Lungs:  [x]   Clear to auscultation               []  Other:   Heart:    [x]   Regular rhythm             []  irregular rhythm    [x]   Previous H&P reviewed, patient examined and there are NO CHANGES                 []   Previous H&P reviewed, patient examined and there are changes noted.   [name], MD 7/27/201610:12 AM

## 2014-11-08 NOTE — Progress Notes (Signed)
   11/08/14 1300  Clinical Encounter Type  Visited With Patient  Visit Type Spiritual support  Referral From Nurse  Consult/Referral To Chaplain  Spiritual Encounters  Spiritual Needs Grief support;Emotional;Prayer  Stress Factors  Patient Stress Factors Loss of control;Loss;Exhausted;Health changes  Family Stress Factors None identified  Advance Directives (For Healthcare)  Does patient have an advance directive? Yes  Would patient like information on creating an advanced directive? No - patient declined information  Type of Advance Directive Healthcare Power of Attorney  Copy of advanced directive(s) in chart? Yes   Chaplain visited patient and provided therapeutic presence, prayer, emotional and grief support.   AD [phone]

## 2014-11-08 NOTE — Progress Notes (Signed)
D: Pt is awake and active in the milieu this evening. Pt mood is irritable and her affect is sad/flat. Pt states that she was avoiding groups today because she felt that staff had been rude during a group earlier this morning. Pt is pleasant and cooperative with staff tonight.  A: Writer provided emotional support and administered medications as prescribed. Writer also encouraged pt not to let one incident prevent her from benefiting from future groups.   R: Pt acknowledges that tomorrow is another day. She went to bed following medication administration.

## 2014-11-08 NOTE — Transfer of Care (Signed)
Immediate Anesthesia Transfer of Care Note  Patient: Brittany [name]  Procedure(s) Performed: ECT Patient Location: PACU  Anesthesia Type:General  Level of Consciousness: awake, alert  and oriented  Airway & Oxygen Therapy: Patient Spontanous Breathing and Patient connected to face mask oxygen  Post-op Assessment: Report given to RN and Post -op Vital signs reviewed and stable  Post vital signs: Reviewed and stable  Last Vitals:  Filed Vitals:   11/08/14 1029  BP: 129/86  Pulse: 78  Temp: 37.3 C  Resp: 16    Complications: No apparent anesthesia complications

## 2014-11-08 NOTE — Discharge Summary (Signed)
Physician Discharge Summary Note  Patient:  Brittany [name] is an 49 y.o., female MRN:  [id number] DOB:  Jul 22, 1965 Patient phone:  [phone] (home)  Patient address:   [address] [place] [address],  Total Time spent with patient: 30 minutes  Date of Admission:  11/02/2014 Date of Discharge: 11/08/2014  Reason for Admission:  Admitted for major depression severe without psychosis for treatment with ECT. Transfer from [facility].  Principal Problem: MDD (major depressive disorder), recurrent severe, without psychosis Discharge Diagnoses: Patient Active Problem List   Diagnosis Date Noted  . Major depressive disorder, recurrent, severe without psychotic features [F33.2]   . MDD (major depressive disorder) [F32.2] 10/30/2014  . MDD (major depressive disorder), recurrent severe, without psychosis [F33.2]   . Suicidal ideation [R45.851] 10/05/2014  . Depression [F32.9] 06/12/2014  . Rash [R21] 01/04/2014  . Normocytic anemia [D64.9] 01/04/2014  . DJD (degenerative joint disease) [M19.90] 01/04/2014  . Hypertension [I10] 01/04/2014  . Dyslipidemia [E78.5] 01/04/2014  . GERD (gastroesophageal reflux disease) [K21.9] 01/04/2014  . Obstructive sleep apnea [G47.33] 01/04/2014  . 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 Abdominal abscess [K65.1] 01/01/2014  . Wound infection after surgery [T81.4XXA] 01/01/2014  . PTSD (post-traumatic stress disorder) [F43.10] 11/23/2013  . Severe recurrent major depression without psychotic features [F33.2] 11/22/2013  . 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 Morbid obesity, Weight - 312, BMI - 45.2 [E66.01] 04/21/2012    Musculoskeletal: Strength & Muscle Tone: within normal limits Gait & Station: normal Patient leans: N/A  Psychiatric Specialty Exam: Physical Exam  Constitutional: She appears well-developed and well-nourished.  HENT:  Head: Normocephalic and atraumatic.  Eyes: Conjunctivae  are normal. Pupils are equal, round, and reactive to light.  Neck: Normal range of motion.  Cardiovascular: Normal heart sounds.   Respiratory: Effort normal.  GI: Soft.  Musculoskeletal: Normal range of motion.  Neurological: She is alert.  Skin: Skin is warm and dry.  Psychiatric: She has a normal mood and affect. Her speech is normal and behavior is normal. Judgment and thought content normal. She exhibits abnormal recent memory.  Patient has some very transient short-term memory impairment related today's treatment but her mood is feeling good. She is alert and oriented and able to make reasonable decisions. Denies acute suicidal ideation. Shows good understanding of the outpatient plan.    Review of Systems  Constitutional: Negative.   HENT: Negative.   Eyes: Negative.   Respiratory: Negative.   Cardiovascular: Negative.   Gastrointestinal: Negative.   Musculoskeletal: Negative.   Skin: Negative.   Neurological: Negative.   Psychiatric/Behavioral: Positive for depression and memory loss. Negative for suicidal ideas, hallucinations and substance abuse. The patient is not nervous/anxious and does not have insomnia.     Blood pressure 109/73, pulse 81, temperature 98.1 F (36.7 C), temperature source Oral, resp. rate 20, height _0  (1.778 m), weight 91.627 kg (202 lb), SpO2 99 %.Body mass index is 28.98 kg/(m^2).  General Appearance: Casual  Eye Contact::  Good  Speech:  Normal Rate  Volume:  Normal  Mood:  Euthymic  Affect:  Appropriate  Thought Process:  Goal Directed  Orientation:  Full (Time, Place, and Person)  Thought Content:  Negative  Suicidal Thoughts:  No  Homicidal Thoughts:  No  Memory:  Immediate;   Good Recent;   Fair Remote;   Fair  Judgement:  Intact  Insight:  Present  Psychomotor Activity:  Normal  Concentration:  Fair  Recall:  [place] of Knowledge:Good  Language: Good  Akathisia:  No  Handed:  Right  AIMS (if indicated):     Assets:   Communication Skills Desire for Improvement Financial Resources/Insurance Housing Physical Health Resilience Social Support  ADL's:  Intact  Cognition: WNL  Sleep:  Number of Hours: 7.25   Have you used any form of tobacco in the last 30 days? (Cigarettes, Smokeless Tobacco, Cigars, and/or Pipes): Yes  Has this patient used any form of tobacco in the last 30 days? (Cigarettes, Smokeless Tobacco, Cigars, and/or Pipes) Yes, A prescription for an FDA-approved tobacco cessation medication was offered at discharge and the patient refused  Past Medical History:  Past Medical History  Diagnosis Date  . Migraine   . Hypertension   . Hypercholesterolemia   . Depression   . Morbid obesity   . Sleep apnea     no CPAP machine   . H/O hiatal hernia   . GERD (gastroesophageal reflux disease)     hx of   . Arthritis     hips, knees and hands   . Anxiety   . PTSD (post-traumatic stress disorder)     Past Surgical History  Procedure Laterality Date  . Foot surgery    . Breath tek h pylori  05/03/2012    Procedure: BREATH TEK H PYLORI;  Surgeon: [name], MD;  Location: [name];  Service: General;  Laterality: N/A;  . Right tube and ovary removed     . Abdominal hysterectomy  1997 & 2006  . Colonoscopy      hx of benign polyps   . 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 Upper gi endoscopy N/A 07/05/2012    Procedure: UPPER GI ENDOSCOPY;  Surgeon: [name], MD;  Location: [facility];  Service: General;  Laterality: N/A;  . Laparoscopic lysis of adhesions N/A 07/05/2012    Procedure: LAPAROSCOPIC LYSIS OF ADHESIONS;  Surgeon: [name], MD;  Location: [facility];  Service: General;  Laterality: N/A;  repair of abdominal wall hernia  . Irrigation and debridement abscess N/A 01/01/2014    Procedure: IRRIGATION AND DEBRIDEMENT ABSCESS;  Surgeon: [name], MD;  Location: [facility];  Service: General;  Laterality: N/A;  . Gastric bypass     Family History:  Family History  Problem Relation Age of Onset  . Cancer Mother     colon  . Cancer Maternal Aunt     breast  . Depression Father   . Depression Sister   . Post-traumatic stress disorder Sister   . Alcohol abuse Paternal Uncle   . Bipolar disorder Cousin   . Anxiety disorder Sister    Social History:  History  Alcohol Use No    Comment: Occasional use     History  Drug Use No    History   Social History  . Marital Status: Married    Spouse Name: N/A  . Number of Children: N/A  . Years of Education: N/A   Social History Main Topics  . Smoking status: Former Smoker    Types: Cigars    Quit date: 04/27/2004  . Smokeless tobacco: Never Used  . Alcohol Use: No     Comment: Occasional use  . Drug Use: No  . Sexual Activity: Yes   Other Topics Concern  . None   Social History Narrative  Past Psychiatric History: Hospitalizations:  Outpatient Care:  Substance Abuse Care:  Self-Mutilation:  Suicidal Attempts:  Violent Behaviors:   Risk to Self: Is patient at risk for suicide?: Yes Risk to Others:   Prior Inpatient Therapy:   Prior Outpatient Therapy:    Level of Care:  OP  Hospital Course:  Patient was admitted for evaluation for ECT. She was evaluated and was then engaged in regular treatment on the unit. She engaged in group and individual therapy. Her Cymbalta was restarted at 60 mg a day which she tolerated. Patient met with the ECT team and gave consent and was started with right unilateral ECT beginning today Wednesday the 27th. Treatment today went very well with no complications. This afternoon she states that she is feeling calm and relaxed. She denies any suicidal intent or plan. She is agreeable to outpatient treatment and feels safe with it. The plan is for her to return for ECT Friday and then into next week at least and otherwise stay on  her current medication and follow-up with her outpatient provider. She agrees to the plan.  Consults:  Anesthesia for ECT  Significant Diagnostic Studies:  labs: Regular lab studies nothing remarkable radiology: CXR: normal cardiac graphics: ECG: Normal  Discharge Vitals:   Blood pressure 109/73, pulse 81, temperature 98.1 F (36.7 C), temperature source Oral, resp. rate 20, height _0  (1.778 m), weight 91.627 kg (202 lb), SpO2 99 %. Body mass index is 28.98 kg/(m^2). Lab Results:   Results for orders placed or performed during the hospital encounter of 11/02/14 (from the past 72 hour(s))  Urinalysis complete, with microscopic ([facility] only)     Status: Abnormal   Collection Time: 11/05/14  4:14 PM  Result Value Ref Range   Color, Urine YELLOW (A) YELLOW   APPearance CLEAR (A) CLEAR   Glucose, UA NEGATIVE NEGATIVE mg/dL   Bilirubin Urine NEGATIVE NEGATIVE   Ketones, ur NEGATIVE NEGATIVE mg/dL   Specific Gravity, Urine 1.009 1.005 - 1.030   Hgb urine dipstick NEGATIVE NEGATIVE   pH 6.0 5.0 - 8.0   Protein, ur NEGATIVE NEGATIVE mg/dL   Nitrite NEGATIVE NEGATIVE   Leukocytes, UA NEGATIVE NEGATIVE   RBC / HPF 0-5 0 - 5 RBC/hpf   WBC, UA 0-5 0 - 5 WBC/hpf   Bacteria, UA RARE (A) NONE SEEN   Squamous Epithelial / LPF 0-5 (A) NONE SEEN   Mucous PRESENT     Physical Findings: AIMS: Facial and Oral Movements Muscles of Facial Expression: None, normal Lips and Perioral Area: None, normal Jaw: None, normal Tongue: None, normal,Extremity Movements Upper (arms, wrists, hands, fingers): None, normal Lower (legs, knees, ankles, toes): None, normal, Trunk Movements Neck, shoulders, hips: None, normal, Overall Severity Severity of abnormal movements (highest score from questions above): None, normal Incapacitation due to abnormal movements: None, normal Patient's awareness of abnormal movements (rate only patient's report): No Awareness, Dental Status Current problems with teeth and/or  dentures?: No Does patient usually wear dentures?: No  CIWA:  CIWA-Ar Total: 0 COWS:      See Psychiatric Specialty Exam and Suicide Risk Assessment completed by Attending Physician prior to discharge.  Discharge destination:  Home  Is patient on multiple antipsychotic therapies at discharge:  No   Has Patient had three or more failed trials of antipsychotic monotherapy by history:  No    Recommended Plan for Multiple Antipsychotic Therapies: NA  Discharge Instructions    Diet - low sodium heart healthy    Complete by:  As directed      Discharge instructions    Complete by:  As directed   Patient will follow-up with outpatient ECT beginning Friday the 29th. Continue current medicine. Continue follow-up with her outpatient psychiatrist     Increase activity slowly    Complete by:  As directed             Medication List    STOP taking these medications        multivitamin with minerals Tabs tablet      TAKE these medications      Indication   cholecalciferol 1000 UNITS tablet  Commonly known as:  VITAMIN D  Take 1 tablet (1,000 Units total) by mouth daily.   Indication:  Per client self report for Vitamin D supplementation     clonazePAM 1 MG tablet  Commonly known as:  KLONOPIN  Take 1 tablet (1 mg total) by mouth daily.   Indication:  Panic Disorder     DULoxetine 60 MG capsule  Commonly known as:  CYMBALTA  Take 1 capsule (60 mg total) by mouth daily.      hydrOXYzine 25 MG tablet  Commonly known as:  ATARAX/VISTARIL  Take 1 tablet (25 mg total) by mouth 3 (three) times daily.   Indication:  Anxiety Neurosis     loratadine 10 MG tablet  Commonly known as:  CLARITIN  Take 1 tablet (10 mg total) by mouth daily.   Indication:  Hayfever     QUEtiapine 100 MG tablet  Commonly known as:  SEROQUEL  Take 1 tablet (100 mg total) by mouth at bedtime.   Indication:  mood stabilization     traZODone 50 MG tablet  Commonly known as:  DESYREL  Take 1 tablet  (50 mg total) by mouth at bedtime as needed for sleep.   Indication:  Trouble Sleeping           Follow-up Information    Follow up with [facility] for inpatient ECT treatments.      Follow-up recommendations:  Activity:  Activity as tolerated Diet:  Diet as tolerated Tests:  No need for any other tests currently  Comments:  Patient met with treatment team and agreed to the plan as stated above.  Total Discharge Time: 35  Signed: Addison Freimuth 11/08/2014, 3:08 PM

## 2014-11-08 NOTE — Plan of Care (Signed)
Problem: Alteration in mood; excessive anxiety as evidenced by: Goal: STG-Patient can identify triggers for anxiety Outcome: Progressing Pt able to identify triggers. Pt states this hospital is one of her triggers

## 2014-11-08 NOTE — [facility] Suicide Risk Assessment (Signed)
[facility] Discharge Suicide Risk Assessment   Demographic Factors:  Caucasian and Unemployed  Total Time spent with patient: 30 minutes  Musculoskeletal: Strength & Muscle Tone: within normal limits Gait & Station: normal Patient leans: N/A  Psychiatric Specialty Exam: Physical Exam  ROS  Blood pressure 109/73, pulse 81, temperature 98.1 F (36.7 C), temperature source Oral, resp. rate 20, height 5\' 10"  (1.778 m), weight 91.627 kg (202 lb), SpO2 99 %.Body mass index is 28.98 kg/(m^2).  General Appearance: Casual  Eye Contact::  Fair  Speech:  Normal Rate409  Volume:  Normal  Mood:  Euthymic  Affect:  Appropriate  Thought Process:  Goal Directed  Orientation:  Full (Time, Place, and Person)  Thought Content:  Negative  Suicidal Thoughts:  No  Homicidal Thoughts:  No  Memory:  Immediate;   Good Recent;   Fair Remote;   Good  Judgement:  Intact  Insight:  Present  Psychomotor Activity:  Normal  Concentration:  Fair  Recall:  [place] of Knowledge:Fair  Language: Good  Akathisia:  No  Handed:  Right  AIMS (if indicated):     Assets:  Communication Skills Desire for Improvement Financial Resources/Insurance Housing Physical Health Social Support  Sleep:  Number of Hours: 7.25  Cognition: WNL  ADL's:  Intact   Have you used any form of tobacco in the last 30 days? (Cigarettes, Smokeless Tobacco, Cigars, and/or Pipes): Yes  Has this patient used any form of tobacco in the last 30 days? (Cigarettes, Smokeless Tobacco, Cigars, and/or Pipes) Yes, A prescription for an FDA-approved tobacco cessation medication was offered at discharge and the patient refused  Mental Status Per Nursing Assessment::   On Admission:  Self-harm thoughts  Current Mental Status by Physician: Patient denies any suicidal thoughts intent or plan  Loss Factors: NA  Historical Factors: Prior suicide attempts  Risk Reduction Factors:   Living with another person, especially a relative,  Positive social support and Positive therapeutic relationship  Continued Clinical Symptoms:  Depression:   Anhedonia  Cognitive Features That Contribute To Risk:  None    Suicide Risk:  Mild:  Suicidal ideation of limited frequency, intensity, duration, and specificity.  There are no identifiable plans, no associated intent, mild dysphoria and related symptoms, good self-control (both objective and subjective assessment), few other risk factors, and identifiable protective factors, including available and accessible social support.  Principal Problem: MDD (major depressive disorder), recurrent severe, without psychosis Discharge Diagnoses:  Patient Active Problem List   Diagnosis Date Noted  . Major depressive disorder, recurrent, severe without psychotic features [F33.2]   . MDD (major depressive disorder) [F32.2] 10/30/2014  . MDD (major depressive disorder), recurrent severe, without psychosis [F33.2]   . Suicidal ideation [R45.851] 10/05/2014  . Depression [F32.9] 06/12/2014  . Rash [R21] 01/04/2014  . Normocytic anemia [D64.9] 01/04/2014  . DJD (degenerative joint disease) [M19.90] 01/04/2014  . Hypertension [I10] 01/04/2014  . Dyslipidemia [E78.5] 01/04/2014  . GERD (gastroesophageal reflux disease) [K21.9] 01/04/2014  . Obstructive sleep apnea [G47.33] 01/04/2014  . 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 Abdominal abscess [K65.1] 01/01/2014  . Wound infection after surgery [T81.4XXA] 01/01/2014  . PTSD (post-traumatic stress disorder) [F43.10] 11/23/2013  . Severe recurrent major depression without psychotic features [F33.2] 11/22/2013  . 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 Morbid obesity, Weight - 312, BMI - 45.2 [E66.01] 04/21/2012    Follow-up Information    Follow up with [facility] for inpatient ECT treatments.  Plan Of Care/Follow-up recommendations:  Activity:  As tolerated Diet:  As tolerated Tests:   None Other:  Follow-up ECT on Friday and into next week  Is patient on multiple antipsychotic therapies at discharge:  No   Has Patient had three or more failed trials of antipsychotic monotherapy by history:  No  Recommended Plan for Multiple Antipsychotic Therapies: NA    Kennette Cuthrell 11/08/2014, 3:15 PM

## 2014-11-08 NOTE — Progress Notes (Signed)
Pt discharged home. DC instructions provided and explained. Medications reviewed. Rx given. All questions answered. Pt stable at discharge. Denies SI, HI, AVH.

## 2014-11-09 ENCOUNTER — Other Ambulatory Visit: Payer: Self-pay | Admitting: *Deleted

## 2014-11-10 ENCOUNTER — Encounter: Payer: Self-pay | Admitting: Anesthesiology

## 2014-11-10 ENCOUNTER — Encounter
Admission: RE | Admit: 2014-11-10 | Discharge: 2014-11-10 | Disposition: A | Discharge status: Home / Self Care | Payer: 59 | Source: Ambulatory Visit | Attending: Psychiatry | Admitting: Psychiatry

## 2014-11-10 ENCOUNTER — Other Ambulatory Visit: Payer: Self-pay

## 2014-11-10 DIAGNOSIS — F332 Major depressive disorder, recurrent severe without psychotic features: Secondary | ICD-10-CM | POA: Insufficient documentation

## 2014-11-10 MED ORDER — SUCCINYLCHOLINE CHLORIDE 20 MG/ML IJ SOLN
110.0000 mg | Freq: Once | INTRAMUSCULAR | Status: AC
  Administered 2014-11-10: 110 mg via INTRAVENOUS

## 2014-11-10 MED ORDER — DEXTROSE 5 % IV SOLN
250.0000 mL | Freq: Once | INTRAVENOUS | Status: AC
  Administered 2014-11-10: 250 mL via INTRAVENOUS

## 2014-11-10 MED ORDER — FENTANYL CITRATE (PF) 100 MCG/2ML IJ SOLN: 25.0000 ug | INTRAMUSCULAR | Status: DC | PRN

## 2014-11-10 MED ORDER — ONDANSETRON HCL 4 MG/2ML IJ SOLN: 4.0000 mg | Freq: Once | INTRAMUSCULAR | Status: AC | PRN

## 2014-11-10 MED ORDER — LIDOCAINE HCL (CARDIAC) 20 MG/ML IV SOLN
4.0000 mg | Freq: Once | INTRAVENOUS | Status: AC
  Administered 2014-11-10: 4 mg via INTRAVENOUS

## 2014-11-10 MED ORDER — METHOHEXITAL SODIUM 100 MG/10ML IV SOSY
90.0000 mg | PREFILLED_SYRINGE | Freq: Once | INTRAVENOUS | Status: AC
  Administered 2014-11-10: 90 mg via INTRAVENOUS

## 2014-11-10 NOTE — Anesthesia Postprocedure Evaluation (Signed)
  Anesthesia Post-op Note  Patient: Brittany [name]  Procedure(s) Performed: * No procedures listed *  Anesthesia type:General  Patient location: PACU  Post pain: Pain level controlled  Post assessment: Post-op Vital signs reviewed, Patient's Cardiovascular Status Stable, Respiratory Function Stable, Patent Airway and No signs of Nausea or vomiting  Post vital signs: Reviewed and stable  Last Vitals:  Filed Vitals:   11/10/14 1102  BP: 108/62  Pulse: 76  Temp:   Resp: 13    Level of consciousness: awake, alert  and patient cooperative  Complications: No apparent anesthesia complications

## 2014-11-10 NOTE — Transfer of Care (Signed)
Immediate Anesthesia Transfer of Care Note  Patient: Brittany [name]  Procedure(s) Performed: ECT  Patient Location: PACU  Anesthesia Type:General  Level of Consciousness: sedated  Airway & Oxygen Therapy: Patient Spontanous Breathing and Patient connected to face mask oxygen  Post-op Assessment: Report given to RN  Post vital signs: Reviewed and stable  Last Vitals:  Filed Vitals:   11/10/14 1051  BP: 105/60  Pulse: 74  Temp: 36.6 C  Resp: 10    Complications: No apparent anesthesia complications

## 2014-11-10 NOTE — Procedures (Signed)
ECT SERVICES Physician's Interval Evaluation & Treatment Note  Patient Identification: Brittany [name] MRN:  [id number] Date of Evaluation:  11/10/2014 TX #: [id number]  MADRS:   MMSE:   P.E. Findings:  No new physical exam changes no new physical complaints  Psychiatric Interval Note:  Subjectively feels like her mood is better  Subjective:  Patient is a 49 y.o. female seen for evaluation for Electroconvulsive Therapy. Did not have any side effects from first treatment. No headache no soreness. No noticed confusion  Treatment Summary:   [x]   Right Unilateral             []  Bilateral   % Energy : 0.3 ms 70%   Impedance: 870 ohms  Seizure Energy Index: 28,098 V squared  Postictal Suppression Index: 98%  Seizure Concordance Index: 96%  Medications  Pre Shock: Xylocaine 4 mg, Brevital 90 mg, succinylcholine 110 mg  Post Shock: None  Seizure Duration: 27 seconds by EMG, 47 seconds by EEG   Comments: Patient will be seen next Monday Wednesday and Friday for continued index treatment. She agrees to the plan. We will increase the succinylcholine dose to 150 mg because of continued excessive movement. Leave parameters of stimulus the same   Lungs:  [x]   Clear to auscultation               []  Other:   Heart:    [x]   Regular rhythm             []  irregular rhythm    [x]   Previous H&P reviewed, patient examined and there are NO CHANGES                 []   Previous H&P reviewed, patient examined and there are changes noted.   [name], MD 7/29/201610:45 AM

## 2014-11-10 NOTE — Progress Notes (Signed)
No information faxed pt to follow-up with ECT

## 2014-11-10 NOTE — Anesthesia Preprocedure Evaluation (Signed)
Anesthesia Evaluation  Patient identified by MRN, date of birth, ID band Patient awake    Reviewed: Allergy & Precautions, NPO status   Airway Mallampati: II  TM Distance: >3 FB Neck ROM: Full    Dental  (+) Caps   Pulmonary sleep apnea , former smoker,    Pulmonary exam normal       Cardiovascular hypertension, Normal cardiovascular exam    Neuro/Psych  Headaches, Anxiety Depression    GI/Hepatic Neg liver ROS, hiatal hernia, GERD-  Medicated and Controlled,  Endo/Other  negative endocrine ROS  Renal/GU negative Renal ROS     Musculoskeletal  (+) Arthritis -, Osteoarthritis,    Abdominal Normal abdominal exam  (+)   Peds negative pediatric ROS (+)  Hematology negative hematology ROS (+) anemia ,   Anesthesia Other Findings Hysterectomy... Hx of morbid obesity in the past  Reproductive/Obstetrics                             Anesthesia Physical Anesthesia Plan  ASA: II  Anesthesia Plan: General   Post-op Pain Management:    Induction: Intravenous  Airway Management Planned: Mask  Additional Equipment:   Intra-op Plan:   Post-operative Plan:   Informed Consent: I have reviewed the patients History and Physical, chart, labs and discussed the procedure including the risks, benefits and alternatives for the proposed anesthesia with the patient or authorized representative who has indicated his/her understanding and acceptance.     Plan Discussed with:   Anesthesia Plan Comments:         Anesthesia Quick Evaluation

## 2014-11-13 ENCOUNTER — Encounter: Payer: 59 | Admitting: Certified Registered Nurse Anesthetist

## 2014-11-13 ENCOUNTER — Other Ambulatory Visit: Payer: Self-pay

## 2014-11-13 ENCOUNTER — Encounter: Payer: Self-pay | Admitting: Certified Registered Nurse Anesthetist

## 2014-11-13 ENCOUNTER — Encounter
Admission: RE | Admit: 2014-11-13 | Discharge: 2014-11-13 | Disposition: A | Discharge status: Home / Self Care | Payer: 59 | Source: Ambulatory Visit | Attending: Internal Medicine | Admitting: Internal Medicine

## 2014-11-13 DIAGNOSIS — F332 Major depressive disorder, recurrent severe without psychotic features: Secondary | ICD-10-CM | POA: Diagnosis not present

## 2014-11-13 DIAGNOSIS — K219 Gastro-esophageal reflux disease without esophagitis: Secondary | ICD-10-CM | POA: Insufficient documentation

## 2014-11-13 DIAGNOSIS — F329 Major depressive disorder, single episode, unspecified: Secondary | ICD-10-CM | POA: Insufficient documentation

## 2014-11-13 DIAGNOSIS — G4730 Sleep apnea, unspecified: Secondary | ICD-10-CM | POA: Insufficient documentation

## 2014-11-13 DIAGNOSIS — K449 Diaphragmatic hernia without obstruction or gangrene: Secondary | ICD-10-CM | POA: Insufficient documentation

## 2014-11-13 MED ORDER — DEXTROSE 5 % IV SOLN
250.0000 mL | Freq: Once | INTRAVENOUS | Status: AC
  Administered 2014-11-13: 250 mL via INTRAVENOUS

## 2014-11-13 MED ORDER — METHOHEXITAL SODIUM 100 MG/10ML IV SOSY
90.0000 mg | PREFILLED_SYRINGE | Freq: Once | INTRAVENOUS | Status: AC
  Administered 2014-11-13: 90 mg via INTRAVENOUS

## 2014-11-13 MED ORDER — LIDOCAINE HCL (CARDIAC) 20 MG/ML IV SOLN
4.0000 mg | Freq: Once | INTRAVENOUS | Status: AC
  Administered 2014-11-13: 4 mg via INTRAVENOUS

## 2014-11-13 MED ORDER — SUCCINYLCHOLINE CHLORIDE 20 MG/ML IJ SOLN
150.0000 mg | Freq: Once | INTRAMUSCULAR | Status: AC
  Administered 2014-11-13: 150 mg via INTRAVENOUS

## 2014-11-13 MED ORDER — DEXTROSE 5 % IV SOLN
INTRAVENOUS | Status: DC | PRN
Start: 2014-11-13 — End: 2014-11-13
  Administered 2014-11-13: 10:00:00 via INTRAVENOUS

## 2014-11-13 NOTE — Anesthesia Procedure Notes (Signed)
Performed by: Demetrius Charity Pre-anesthesia Checklist: Patient identified, Patient being monitored, Timeout performed, Emergency Drugs available and Suction available Patient Re-evaluated:Patient Re-evaluated prior to inductionOxygen Delivery Method: Circle system utilized Preoxygenation: Pre-oxygenation with 100% oxygen Intubation Type: IV induction Ventilation: Mask ventilation without difficulty Airway Equipment and Method: Bite block Dental Injury: Teeth and Oropharynx as per pre-operative assessment

## 2014-11-13 NOTE — Anesthesia Postprocedure Evaluation (Signed)
  Anesthesia Post-op Note  Patient: Brittany [name]  Procedure(s) Performed: * No procedures listed *  Anesthesia type:General  Patient location: PACU  Post pain: Pain level controlled  Post assessment: Post-op Vital signs reviewed, Patient's Cardiovascular Status Stable, Respiratory Function Stable, Patent Airway and No signs of Nausea or vomiting  Post vital signs: Reviewed and stable  Last Vitals:  Filed Vitals:   11/13/14 1118  BP: 122/83  Pulse: 62  Temp:   Resp: 16    Level of consciousness: awake, alert  and patient cooperative  Complications: No apparent anesthesia complications

## 2014-11-13 NOTE — Transfer of Care (Signed)
Immediate Anesthesia Transfer of Care Note  Patient: Brittany [name]  Procedure(s) Performed: ECT Patient Location: PACU  Anesthesia Type:General  Level of Consciousness: awake, alert  and oriented  Airway & Oxygen Therapy: Patient Spontanous Breathing and Patient connected to face mask oxygen  Post-op Assessment: Report given to RN and Post -op Vital signs reviewed and stable  Post vital signs: Reviewed and stable  Last Vitals:  Filed Vitals:   11/13/14 0927  BP: 112/82  Pulse: 56  Temp: 36.6 C  Resp: 18    Complications: No apparent anesthesia complications

## 2014-11-13 NOTE — Addendum Note (Signed)
Addendum  created 11/13/14 1144 by Demetrius Charity, CRNA   Modules edited: Charges VN

## 2014-11-13 NOTE — Anesthesia Preprocedure Evaluation (Signed)
Anesthesia Evaluation  Patient identified by MRN, date of birth, ID band Patient awake    Reviewed: Allergy & Precautions, NPO status   Airway Mallampati: II       Dental no notable dental hx.    Pulmonary sleep apnea , former smoker,    Pulmonary exam normal       Cardiovascular hypertension, Pt. on medications Normal cardiovascular exam    Neuro/Psych    GI/Hepatic Neg liver ROS, hiatal hernia, GERD-  ,  Endo/Other    Renal/GU negative Renal ROS     Musculoskeletal  (+) Arthritis -,   Abdominal Normal abdominal exam  (+)   Peds negative pediatric ROS (+)  Hematology  (+) anemia ,   Anesthesia Other Findings   Reproductive/Obstetrics                             Anesthesia Physical Anesthesia Plan  ASA: III  Anesthesia Plan: General   Post-op Pain Management:    Induction: Intravenous  Airway Management Planned: Mask  Additional Equipment:   Intra-op Plan:   Post-operative Plan:   Informed Consent: I have reviewed the patients History and Physical, chart, labs and discussed the procedure including the risks, benefits and alternatives for the proposed anesthesia with the patient or authorized representative who has indicated his/her understanding and acceptance.     Plan Discussed with: CRNA  Anesthesia Plan Comments:         Anesthesia Quick Evaluation

## 2014-11-13 NOTE — Progress Notes (Signed)
Pt states she had a good weekend.  Stayed very busy in garden and house work.  States she had no thoughts of suicide this weekend.  Went to visit her family this weekend.

## 2014-11-13 NOTE — Procedures (Signed)
ECT SERVICES Physician's Interval Evaluation & Treatment Note  Patient Identification: Brittany [name] MRN:  [id number] Date of Evaluation:  11/13/2014 TX #: 3  MADRS:   MMSE:   P.E. Findings:  No new physical complaints. Vitals stable. No findings.  Psychiatric Interval Note:  Mood continues to be improved. Particularly notices no suicidal ideation.  Subjective:  Patient is a 49 y.o. female seen for evaluation for Electroconvulsive Therapy. Patient has no new complaints and is feeling optimistic about treatment  Treatment Summary:   [x]   Right Unilateral             []  Bilateral   % Energy : 0.3 ms 70%   Impedance: 1580 ohms  Seizure Energy Index: 22,392 V squared  Postictal Suppression Index: 76%  Seizure Concordance Index: 94%  Medications  Pre Shock: Xylocaine 4 mg, Brevital 90 mg, succinylcholine 150 mg  Post Shock: None  Seizure Duration: 29 seconds by EMG, 55 seconds by EEG   Comments: Patient had no complaints. Agrees to next treatment on Wednesday to continue index course.   Lungs:  [x]   Clear to auscultation               []  Other:   Heart:    [x]   Regular rhythm             []  irregular rhythm    [x]   Previous H&P reviewed, patient examined and there are NO CHANGES                 []   Previous H&P reviewed, patient examined and there are changes noted.   [name], MD 8/1/201610:40 AM   2

## 2014-11-15 ENCOUNTER — Ambulatory Visit (HOSPITAL_COMMUNITY): Payer: Self-pay | Admitting: Clinical

## 2014-11-15 ENCOUNTER — Encounter (HOSPITAL_BASED_OUTPATIENT_CLINIC_OR_DEPARTMENT_OTHER)
Admission: RE | Admit: 2014-11-15 | Discharge: 2014-11-15 | Disposition: A | Discharge status: Home / Self Care | Payer: 59 | Source: Ambulatory Visit | Attending: Psychiatry | Admitting: Psychiatry

## 2014-11-15 ENCOUNTER — Encounter: Payer: 59 | Admitting: Certified Registered Nurse Anesthetist

## 2014-11-15 ENCOUNTER — Encounter: Payer: Self-pay | Admitting: Certified Registered Nurse Anesthetist

## 2014-11-15 ENCOUNTER — Other Ambulatory Visit: Payer: Self-pay

## 2014-11-15 DIAGNOSIS — F329 Major depressive disorder, single episode, unspecified: Secondary | ICD-10-CM | POA: Diagnosis not present

## 2014-11-15 DIAGNOSIS — F332 Major depressive disorder, recurrent severe without psychotic features: Secondary | ICD-10-CM

## 2014-11-15 MED ORDER — SUCCINYLCHOLINE CHLORIDE 20 MG/ML IJ SOLN
150.0000 mg | Freq: Once | INTRAMUSCULAR | Status: AC
  Administered 2014-11-15: 150 mg via INTRAVENOUS

## 2014-11-15 MED ORDER — DEXTROSE 5 % IV SOLN
250.0000 mL | Freq: Once | INTRAVENOUS | Status: AC
  Administered 2014-11-15: 250 mL via INTRAVENOUS

## 2014-11-15 MED ORDER — LIDOCAINE HCL (CARDIAC) 20 MG/ML IV SOLN
4.0000 mg | Freq: Once | INTRAVENOUS | Status: AC
  Administered 2014-11-15: 4 mg via INTRAVENOUS

## 2014-11-15 MED ORDER — DEXTROSE 5 % IV SOLN
INTRAVENOUS | Status: DC | PRN
Start: 2014-11-15 — End: 2014-11-15
  Administered 2014-11-15: 09:00:00 via INTRAVENOUS

## 2014-11-15 MED ORDER — METHOHEXITAL SODIUM 100 MG/10ML IV SOSY
90.0000 mg | PREFILLED_SYRINGE | Freq: Once | INTRAVENOUS | Status: AC
  Administered 2014-11-15: 90 mg via INTRAVENOUS

## 2014-11-15 NOTE — Transfer of Care (Signed)
Immediate Anesthesia Transfer of Care Note  Patient: Brittany [name]  Procedure(s) Performed: ECT Patient Location: PACU  Anesthesia Type:General  Level of Consciousness: awake and alert   Airway & Oxygen Therapy: Patient Spontanous Breathing and Patient connected to face mask oxygen  Post-op Assessment: Report given to RN and Post -op Vital signs reviewed and stable  Post vital signs: Reviewed and stable  Last Vitals:  Filed Vitals:   11/15/14 0849  BP: 117/55  Pulse: 59  Temp: 37.6 C  Resp: 18    Complications: No complications

## 2014-11-15 NOTE — Anesthesia Postprocedure Evaluation (Signed)
  Anesthesia Post-op Note  Patient: Brittany [name]  Procedure(s) Performed: * No procedures listed *  Anesthesia type:General  Patient location: PACU  Post pain: Pain level controlled  Post assessment: Post-op Vital signs reviewed, Patient's Cardiovascular Status Stable, Respiratory Function Stable, Patent Airway and No signs of Nausea or vomiting  Post vital signs: Reviewed and stable  Last Vitals:  Filed Vitals:   11/15/14 1122  BP: 135/53  Pulse: 69  Temp:   Resp: 18    Level of consciousness: awake, alert  and patient cooperative  Complications: No apparent anesthesia complications

## 2014-11-15 NOTE — Procedures (Signed)
ECT SERVICES Physician's Interval Evaluation & Treatment Note  Patient Identification: Brittany [name] MRN:  [id number] Date of Evaluation:  11/15/2014 TX #: 4  MADRS: 29   MMSE: 30  P.E. Findings: No changed any physical exam findings  Mood continues to feel improved without suicidal ideation and anxiety improved  Psychiatric Interval Note:  Generally doing better. No new complaints. Minor notice of memory loss  Subjective:  Patient is a 49 y.o. female seen for evaluation for Electroconvulsive Therapy. Patient has no new complaints. Doing well  Treatment Summary:   [x]   Right Unilateral             []  Bilateral   % Energy : 0.3 ms, 70%   Impedance: 1030 ohms  Seizure Energy Index: 49,015 V squared  Postictal Suppression Index: Not red  Seizure Concordance Index: 100%  Medications  Pre Shock: Xylocaine 4 mg, Brevital 90 mg, succinylcholine 150 mg  Post Shock: None  Seizure Duration: 28 seconds by EMG, 37 seconds by EEG   Comments: No change to treatment plan follow-up Friday   Lungs:  [x]   Clear to auscultation               []  Other:   Heart:    [x]   Regular rhythm             []  irregular rhythm    [x]   Previous H&P reviewed, patient examined and there are NO CHANGES                 []   Previous H&P reviewed, patient examined and there are changes noted.   [name], MD 8/3/201610:35 AM

## 2014-11-15 NOTE — Anesthesia Preprocedure Evaluation (Signed)
Anesthesia Evaluation  Patient identified by MRN, date of birth, ID band Patient awake    Reviewed: Allergy & Precautions, NPO status   Airway Mallampati: II  TM Distance: >3 FB Neck ROM: Full    Dental no notable dental hx. (+) Teeth Intact   Pulmonary sleep apnea , former smoker,    Pulmonary exam normal       Cardiovascular hypertension, Pt. on medications Normal cardiovascular exam    Neuro/Psych    GI/Hepatic Neg liver ROS, hiatal hernia, GERD-  ,  Endo/Other    Renal/GU negative Renal ROS     Musculoskeletal  (+) Arthritis -,   Abdominal Normal abdominal exam  (+)   Peds negative pediatric ROS (+)  Hematology  (+) anemia ,   Anesthesia Other Findings   Reproductive/Obstetrics                             Anesthesia Physical Anesthesia Plan  ASA: III  Anesthesia Plan: General   Post-op Pain Management:    Induction: Intravenous  Airway Management Planned: Mask  Additional Equipment:   Intra-op Plan:   Post-operative Plan:   Informed Consent: I have reviewed the patients History and Physical, chart, labs and discussed the procedure including the risks, benefits and alternatives for the proposed anesthesia with the patient or authorized representative who has indicated his/her understanding and acceptance.     Plan Discussed with:   Anesthesia Plan Comments:         Anesthesia Quick Evaluation

## 2014-11-15 NOTE — Anesthesia Procedure Notes (Signed)
Performed by: Demetrius Charity Pre-anesthesia Checklist: Patient identified, Patient being monitored, Timeout performed, Emergency Drugs available and Suction available Patient Re-evaluated:Patient Re-evaluated prior to inductionOxygen Delivery Method: Circle system utilized Preoxygenation: Pre-oxygenation with 100% oxygen Intubation Type: IV induction Ventilation: Mask ventilation without difficulty Airway Equipment and Method: Bite block Dental Injury: Teeth and Oropharynx as per pre-operative assessment

## 2014-11-17 ENCOUNTER — Encounter: Payer: Self-pay | Admitting: Anesthesiology

## 2014-11-17 ENCOUNTER — Encounter (HOSPITAL_BASED_OUTPATIENT_CLINIC_OR_DEPARTMENT_OTHER)
Admission: RE | Admit: 2014-11-17 | Discharge: 2014-11-17 | Disposition: A | Discharge status: Home / Self Care | Payer: 59 | Source: Ambulatory Visit | Attending: Psychiatry | Admitting: Psychiatry

## 2014-11-17 DIAGNOSIS — F332 Major depressive disorder, recurrent severe without psychotic features: Secondary | ICD-10-CM

## 2014-11-17 DIAGNOSIS — F329 Major depressive disorder, single episode, unspecified: Secondary | ICD-10-CM | POA: Diagnosis not present

## 2014-11-17 MED ORDER — LIDOCAINE HCL (CARDIAC) 20 MG/ML IV SOLN
4.0000 mg | Freq: Once | INTRAVENOUS | Status: AC
  Administered 2014-11-17: 4 mg via INTRAVENOUS

## 2014-11-17 MED ORDER — SUCCINYLCHOLINE CHLORIDE 20 MG/ML IJ SOLN
150.0000 mg | Freq: Once | INTRAMUSCULAR | Status: AC
  Administered 2014-11-17: 150 mg via INTRAVENOUS

## 2014-11-17 MED ORDER — METHOHEXITAL SODIUM 100 MG/10ML IV SOSY
90.0000 mg | PREFILLED_SYRINGE | Freq: Once | INTRAVENOUS | Status: AC
  Administered 2014-11-17: 90 mg via INTRAVENOUS

## 2014-11-17 MED ORDER — DEXTROSE 5 % IV SOLN
250.0000 mL | Freq: Once | INTRAVENOUS | Status: AC
  Administered 2014-11-17: 250 mL via INTRAVENOUS

## 2014-11-17 NOTE — Anesthesia Postprocedure Evaluation (Signed)
  Anesthesia Post-op Note  Patient: Brittany [name]  Procedure(s) Performed: * No procedures listed *  Anesthesia type:General  Patient location: PACU  Post pain: Pain level controlled  Post assessment: Post-op Vital signs reviewed, Patient's Cardiovascular Status Stable, Respiratory Function Stable, Patent Airway and No signs of Nausea or vomiting  Post vital signs: Reviewed and stable  Last Vitals:  Filed Vitals:   11/17/14 1205  BP: 111/90  Pulse: 69  Temp: 37.8 C  Resp: 17    Level of consciousness: awake, alert  and patient cooperative  Complications: No apparent anesthesia complications

## 2014-11-17 NOTE — Procedures (Signed)
ECT SERVICES Physician's Interval Evaluation & Treatment Note  Patient Identification: Brittany [name] MRN:  [id number] Date of Evaluation:  11/17/2014 TX #: 5  MADRS:   MMSE:   P.E. Findings:  No new physical complaints or findings  Psychiatric Interval Note:  Mood continues to be improved with no suicidal ideation but she is noting much more memory impairment  Subjective:  Patient is a 49 y.o. female seen for evaluation for Electroconvulsive Therapy. Patient is disturbed by the memory impairment  Treatment Summary:   [x]   Right Unilateral             []  Bilateral   % Energy : 0.3 ms 70%   Impedance: 1330 ohms  Seizure Energy Index: 17,094 V squared  Postictal Suppression Index: 96%  Seizure Concordance Index: 96%  Medications  Pre Shock: Xylocaine 4 mg, Brevital 90 mg, succinylcholine 150 mg  Post Shock: None  Seizure Duration: EMG 27 seconds, EEG 58 seconds   Comments: Because of her concern about the memory impairment we will hold off and see her back in another week. I think she is gotten significant mood benefit but will feel worse and worse from the memory. Patient is agreeable to the plan   Lungs:  [x]   Clear to auscultation               []  Other:   Heart:    [x]   Regular rhythm             []  irregular rhythm    [x]   Previous H&P reviewed, patient examined and there are NO CHANGES                 []   Previous H&P reviewed, patient examined and there are changes noted.   [name], MD 8/5/201611:45 AM

## 2014-11-17 NOTE — Anesthesia Preprocedure Evaluation (Signed)
Anesthesia Evaluation  Patient identified by MRN, date of birth, ID band Patient awake    Reviewed: Allergy & Precautions, NPO status , Patient's Chart, lab work & pertinent test results  Airway Mallampati: I  TM Distance: >3 FB Neck ROM: Full    Dental  (+) Teeth Intact   Pulmonary sleep apnea , former smoker,  Not using CPAP.   Pulmonary exam normal       Cardiovascular Normal cardiovascular exam    Neuro/Psych Anxiety Depression    GI/Hepatic   Endo/Other    Renal/GU      Musculoskeletal   Abdominal (+)  Abdomen: soft.    Peds  Hematology   Anesthesia Other Findings   Reproductive/Obstetrics                             Anesthesia Physical Anesthesia Plan  ASA: III  Anesthesia Plan: General   Post-op Pain Management:    Induction: Intravenous  Airway Management Planned: Mask  Additional Equipment:   Intra-op Plan:   Post-operative Plan:   Informed Consent: I have reviewed the patients History and Physical, chart, labs and discussed the procedure including the risks, benefits and alternatives for the proposed anesthesia with the patient or authorized representative who has indicated his/her understanding and acceptance.     Plan Discussed with: CRNA  Anesthesia Plan Comments:         Anesthesia Quick Evaluation

## 2014-11-17 NOTE — Transfer of Care (Signed)
Immediate Anesthesia Transfer of Care Note  Patient: Brittany [name]  Procedure(s) Performed "ECT  Patient Location: PACU  Anesthesia Type:General  Level of Consciousness: awake and confused  Airway & Oxygen Therapy: Patient Spontanous Breathing  Post-op Assessment: Report given to RN  Post vital signs: Reviewed and stable  Last Vitals:  Filed Vitals:   11/17/14 1205  BP: 111/90  Pulse: 78  Temp: 100.84F  Resp: 18    Complications: No apparent anesthesia complications

## 2014-11-17 NOTE — Anesthesia Postprocedure Evaluation (Signed)
  Anesthesia Post-op Note  Patient: Brittany [name]  Procedure(s) Performed: * No procedures listed *  Anesthesia type:General  Patient location: PACU  Post pain: Pain level controlled  Post assessment: Post-op Vital signs reviewed, Patient's Cardiovascular Status Stable, Respiratory Function Stable, Patent Airway and No signs of Nausea or vomiting  Post vital signs: Reviewed and stable  Last Vitals:  Filed Vitals:   11/17/14 0935  BP: 120/55  Pulse: 55  Temp: 36.6 C  Resp: 18    Level of consciousness: awake, alert  and patient cooperative  Complications: No apparent anesthesia complications

## 2014-11-17 NOTE — Anesthesia Procedure Notes (Signed)
Date/Time: 11/17/2014 11:51 AM Performed by: Dionne Bucy Pre-anesthesia Checklist: Patient identified, Timeout performed, Emergency Drugs available, Suction available and Patient being monitored Patient Re-evaluated:Patient Re-evaluated prior to inductionOxygen Delivery Method: Circle system utilized Preoxygenation: Pre-oxygenation with 100% oxygen Intubation Type: IV induction Ventilation: Mask ventilation without difficulty Airway Equipment and Method: Bite block Placement Confirmation: positive ETCO2 Dental Injury: Teeth and Oropharynx as per pre-operative assessment

## 2014-11-20 ENCOUNTER — Other Ambulatory Visit: Payer: Self-pay

## 2014-11-24 ENCOUNTER — Encounter: Payer: Self-pay | Admitting: Anesthesiology

## 2014-11-24 ENCOUNTER — Encounter
Admission: RE | Admit: 2014-11-24 | Discharge: 2014-11-24 | Disposition: A | Discharge status: Home / Self Care | Payer: 59 | Source: Ambulatory Visit | Attending: Psychiatry | Admitting: Psychiatry

## 2014-11-24 DIAGNOSIS — F332 Major depressive disorder, recurrent severe without psychotic features: Secondary | ICD-10-CM | POA: Diagnosis present

## 2014-11-24 MED ORDER — DEXTROSE 5 % IV SOLN
250.0000 mL | Freq: Once | INTRAVENOUS | Status: AC
  Administered 2014-11-24: 250 mL via INTRAVENOUS

## 2014-11-24 MED ORDER — METHOHEXITAL SODIUM 100 MG/10ML IV SOSY
90.0000 mg | PREFILLED_SYRINGE | Freq: Once | INTRAVENOUS | Status: AC
  Administered 2014-11-24: 90 mg via INTRAVENOUS

## 2014-11-24 MED ORDER — SUCCINYLCHOLINE CHLORIDE 20 MG/ML IJ SOLN
150.0000 mg | Freq: Once | INTRAMUSCULAR | Status: AC
  Administered 2014-11-24: 150 mg via INTRAVENOUS

## 2014-11-24 MED ORDER — LIDOCAINE HCL (CARDIAC) 20 MG/ML IV SOLN
4.0000 mg | Freq: Once | INTRAVENOUS | Status: AC
  Administered 2014-11-24: 4 mg via INTRAVENOUS

## 2014-11-24 NOTE — Transfer of Care (Signed)
Immediate Anesthesia Transfer of Care Note  Patient: Brittany [name]  Procedure(s) Performed: ECT  Patient Location: PACU  Anesthesia Type:General  Level of Consciousness: awake  Airway & Oxygen Therapy: Patient connected to face mask oxygen  Post-op Assessment: Report given to RN  Post vital signs: Reviewed and stable  Last Vitals:  Filed Vitals:   11/24/14 0900  BP: 107/67  Pulse: 63  Temp: 57.5 C    Complications: No apparent anesthesia complications

## 2014-11-24 NOTE — Anesthesia Postprocedure Evaluation (Signed)
  Anesthesia Post-op Note  Patient: Brittany [name]  Procedure(s) Performed: * No procedures listed *  Anesthesia type:General  Patient location: PACU  Post pain: Pain level controlled  Post assessment: Post-op Vital signs reviewed, Patient's Cardiovascular Status Stable, Respiratory Function Stable, Patent Airway and No signs of Nausea or vomiting  Post vital signs: Reviewed and stable  Last Vitals:  Filed Vitals:   11/24/14 1224  BP: 93/63  Pulse: 78  Temp: 36.9 C  Resp: 18    Level of consciousness: awake, alert  and patient cooperative  Complications: No apparent anesthesia complications

## 2014-11-24 NOTE — Anesthesia Procedure Notes (Signed)
Date/Time: 11/24/2014 11:22 AM Performed by: Dionne Bucy Pre-anesthesia Checklist: Patient identified, Timeout performed, Emergency Drugs available, Suction available and Patient being monitored Patient Re-evaluated:Patient Re-evaluated prior to inductionOxygen Delivery Method: Circle system utilized Preoxygenation: Pre-oxygenation with 100% oxygen Intubation Type: IV induction Ventilation: Mask ventilation throughout procedure Airway Equipment and Method: Bite block Placement Confirmation: positive ETCO2 Dental Injury: Teeth and Oropharynx as per pre-operative assessment

## 2014-11-24 NOTE — Anesthesia Preprocedure Evaluation (Signed)
Anesthesia Evaluation  Patient identified by MRN, date of birth, ID band Patient awake    Reviewed: Allergy & Precautions, NPO status , Patient's Chart, lab work & pertinent test results  History of Anesthesia Complications Negative for: history of anesthetic complications  Airway Mallampati: II  TM Distance: >3 FB Neck ROM: Full    Dental  (+) Teeth Intact   Pulmonary sleep apnea , former smoker,  Not using CPAP.   Pulmonary exam normal       Cardiovascular Exercise Tolerance: Good hypertension, Normal cardiovascular exam    Neuro/Psych  Headaches, PSYCHIATRIC DISORDERS Anxiety Depression    GI/Hepatic Neg liver ROS, hiatal hernia, GERD-  Controlled,  Endo/Other  negative endocrine ROS  Renal/GU negative Renal ROS  negative genitourinary   Musculoskeletal   Abdominal (+)  Abdomen: soft.    Peds  Hematology   Anesthesia Other Findings Past Medical History:   Migraine                                                     Hypertension                                                 Hypercholesterolemia                                         Depression                                                   Morbid obesity                                               Sleep apnea                                                    Comment:no CPAP machine    H/O hiatal hernia                                            GERD (gastroesophageal reflux disease)                         Comment:hx of    Arthritis                                                      Comment:hips, knees and hands    Anxiety  PTSD (post-traumatic stress disorder)                        Requested that patient remove contact lenses to help decrease risk of corneal abrasions   Reproductive/Obstetrics                             Anesthesia Physical  Anesthesia  Plan  ASA: III  Anesthesia Plan: General   Post-op Pain Management:    Induction: Intravenous  Airway Management Planned: Mask  Additional Equipment:   Intra-op Plan:   Post-operative Plan:   Informed Consent: I have reviewed the patients History and Physical, chart, labs and discussed the procedure including the risks, benefits and alternatives for the proposed anesthesia with the patient or authorized representative who has indicated his/her understanding and acceptance.     Plan Discussed with: CRNA, Surgeon and Anesthesiologist  Anesthesia Plan Comments:         Anesthesia Quick Evaluation

## 2014-11-24 NOTE — Procedures (Signed)
ECT SERVICES Physician's Interval Evaluation & Treatment Note  Patient Identification: Brittany [name] MRN:  [id number] Date of Evaluation:  11/24/2014 TX #: 6  MADRS:   MMSE:   P.E. Findings:  No new physical findings or changes to exam  Psychiatric Interval Note:  Mood continues to be modestly improved. No suicidal ideation. Feeling more optimistic. Had significant memory loss after last treatment which has gotten better over the last couple days  Subjective:  Patient is a 49 y.o. female seen for evaluation for Electroconvulsive Therapy. Overall feeling better. Reasonable concerns about memory impairment. Discussion today about return to work and course of treatment  Treatment Summary:   [x]   Right Unilateral             []  Bilateral   % Energy : 0.3 ms, 70%   Impedance: 1040 ohms  Seizure Energy Index: 17,146 V squared  Postictal Suppression Index: 69%  Seizure Concordance Index: 97%  Medications  Pre Shock: Xylocaine 4 mg, Brevital 90 mg, succinylcholine 150 mg  Post Shock: None  Seizure Duration: 28 seconds by EMG, 61 seconds by EEG   Comments: Plan is for follow-up in one week on Friday at which time we will reassess her improvement in mood and concerns about memory loss. Patient is wanting to consider return to work by the end of the month. We will discuss that further next week. Patient agrees to the plan. She is aware that she is likely to have some memory impairment over this weekend   Lungs:  [x]   Clear to auscultation               []  Other:   Heart:    [x]   Regular rhythm             []  irregular rhythm    [x]   Previous H&P reviewed, patient examined and there are NO CHANGES                 []   Previous H&P reviewed, patient examined and there are changes noted.   [name], MD 8/12/201611:18 AM   2

## 2014-11-29 ENCOUNTER — Other Ambulatory Visit: Payer: Self-pay

## 2014-12-01 ENCOUNTER — Encounter: Payer: Self-pay | Admitting: Anesthesiology

## 2014-12-01 ENCOUNTER — Encounter
Admission: RE | Admit: 2014-12-01 | Discharge: 2014-12-01 | Disposition: A | Discharge status: Home / Self Care | Payer: 59 | Source: Ambulatory Visit | Attending: Psychiatry | Admitting: Psychiatry

## 2014-12-01 DIAGNOSIS — F332 Major depressive disorder, recurrent severe without psychotic features: Secondary | ICD-10-CM | POA: Diagnosis present

## 2014-12-01 MED ORDER — METHOHEXITAL SODIUM 100 MG/10ML IV SOSY
90.0000 mg | PREFILLED_SYRINGE | Freq: Once | INTRAVENOUS | Status: AC
  Administered 2014-12-01: 90 mg via INTRAVENOUS

## 2014-12-01 MED ORDER — SUCCINYLCHOLINE CHLORIDE 20 MG/ML IJ SOLN
150.0000 mg | Freq: Once | INTRAMUSCULAR | Status: AC
  Administered 2014-12-01: 150 mg via INTRAVENOUS

## 2014-12-01 MED ORDER — DEXTROSE 5 % IV SOLN
250.0000 mL | Freq: Once | INTRAVENOUS | Status: AC
  Administered 2014-12-01: 250 mL via INTRAVENOUS

## 2014-12-01 MED ORDER — LIDOCAINE HCL (CARDIAC) 20 MG/ML IV SOLN
4.0000 mg | Freq: Once | INTRAVENOUS | Status: AC
  Administered 2014-12-01: 4 mg via INTRAVENOUS

## 2014-12-01 NOTE — Anesthesia Procedure Notes (Signed)
Date/Time: 12/01/2014 11:17 AM Performed by: Dionne Bucy Pre-anesthesia Checklist: Patient identified, Timeout performed, Emergency Drugs available, Suction available and Patient being monitored Patient Re-evaluated:Patient Re-evaluated prior to inductionOxygen Delivery Method: Circle system utilized Preoxygenation: Pre-oxygenation with 100% oxygen Intubation Type: IV induction Ventilation: Mask ventilation without difficulty and Mask ventilation throughout procedure Airway Equipment and Method: Bite block Placement Confirmation: positive ETCO2 Dental Injury: Teeth and Oropharynx as per pre-operative assessment

## 2014-12-01 NOTE — Anesthesia Postprocedure Evaluation (Signed)
  Anesthesia Post-op Note  Patient: Brittany [name]  Procedure(s) Performed: * No procedures listed *  Anesthesia type:General  Patient location: PACU  Post pain: Pain level controlled  Post assessment: Post-op Vital signs reviewed, Patient's Cardiovascular Status Stable, Respiratory Function Stable, Patent Airway and No signs of Nausea or vomiting  Post vital signs: Reviewed and stable  Last Vitals:  Filed Vitals:   12/01/14 1207  BP: 116/63  Pulse:   Temp:   Resp:     Level of consciousness: awake, alert  and patient cooperative  Complications: No apparent anesthesia complications

## 2014-12-01 NOTE — Procedures (Signed)
ECT SERVICES Physician's Interval Evaluation & Treatment Note  Patient Identification: Brittany [name] MRN:  [id number] Date of Evaluation:  12/01/2014 TX #: 7  MADRS:   MMSE:   P.E. Findings:  No new physical complaints or findings  Psychiatric Interval Note:  Mood continues to be quite a bit better. Memory problems clearing up.  Subjective:  Patient is a 49 y.o. female seen for evaluation for Electroconvulsive Therapy. Patient feels much better and wants to go back to work. We discussed a plan of tapering maintenance treatment.  Treatment Summary:   [x]   Right Unilateral             []  Bilateral   % Energy : 0.3 ms, 70%   Impedance: 900 ohms  Seizure Energy Index: 17,604 V squared  Postictal Suppression Index: 98%  Seizure Concordance Index: 98%  Medications  Pre Shock: Xylocaine 4 mg, Brevital 90 mg, succinylcholine 150 mg  Post Shock: None  Seizure Duration: 32 seconds by EMG, 64 seconds by EEG   Comments: Follow-up one week   Lungs:  [x]   Clear to auscultation               []  Other:   Heart:    [x]   Regular rhythm             []  irregular rhythm    [x]   Previous H&P reviewed, patient examined and there are NO CHANGES                 []   Previous H&P reviewed, patient examined and there are changes noted.   [name], MD 8/19/201611:22 AM

## 2014-12-01 NOTE — Anesthesia Preprocedure Evaluation (Signed)
Anesthesia Evaluation  Patient identified by MRN, date of birth, ID band Patient awake    Reviewed: Allergy & Precautions, NPO status , Patient's Chart, lab work & pertinent test results  History of Anesthesia Complications Negative for: history of anesthetic complications  Airway Mallampati: II  TM Distance: >3 FB Neck ROM: Full    Dental  (+) Teeth Intact   Pulmonary sleep apnea , former smoker,  Not using CPAP.   Pulmonary exam normal       Cardiovascular Exercise Tolerance: Good hypertension, Normal cardiovascular exam    Neuro/Psych  Headaches, PSYCHIATRIC DISORDERS Anxiety Depression    GI/Hepatic Neg liver ROS, hiatal hernia, GERD-  Controlled,  Endo/Other  negative endocrine ROS  Renal/GU negative Renal ROS  negative genitourinary   Musculoskeletal   Abdominal (+)  Abdomen: soft.    Peds  Hematology   Anesthesia Other Findings Past Medical History:   Migraine                                                     Hypertension                                                 Hypercholesterolemia                                         Depression                                                   Morbid obesity                                               Sleep apnea                                                    Comment:no CPAP machine    H/O hiatal hernia                                            GERD (gastroesophageal reflux disease)                         Comment:hx of    Arthritis                                                      Comment:hips, knees and hands    Anxiety  PTSD (post-traumatic stress disorder)                        Requested that patient remove contact lenses to help decrease risk of corneal abrasions   Reproductive/Obstetrics                             Anesthesia Physical  Anesthesia  Plan  ASA: III  Anesthesia Plan: General   Post-op Pain Management:    Induction: Intravenous  Airway Management Planned: Mask  Additional Equipment:   Intra-op Plan:   Post-operative Plan:   Informed Consent: I have reviewed the patients History and Physical, chart, labs and discussed the procedure including the risks, benefits and alternatives for the proposed anesthesia with the patient or authorized representative who has indicated his/her understanding and acceptance.     Plan Discussed with: CRNA, Surgeon and Anesthesiologist  Anesthesia Plan Comments:         Anesthesia Quick Evaluation

## 2014-12-01 NOTE — Transfer of Care (Signed)
Immediate Anesthesia Transfer of Care Note  Patient: Brittany [name]  Procedure(s) Performed: ECT  Patient Location: PACU  Anesthesia Type:General  Level of Consciousness: sedated  Airway & Oxygen Therapy: Patient Spontanous Breathing and Patient connected to face mask oxygen  Post-op Assessment: Report given to RN  Post vital signs: Reviewed and stable  Last Vitals:  Filed Vitals:   12/01/14 1129  BP: 106/77  Pulse: 63  Temp: 37.2 C  Resp: 15    Complications: No apparent anesthesia complications

## 2014-12-06 ENCOUNTER — Other Ambulatory Visit: Payer: Self-pay

## 2014-12-06 ENCOUNTER — Ambulatory Visit (HOSPITAL_COMMUNITY): Payer: Self-pay | Admitting: Clinical

## 2014-12-08 ENCOUNTER — Encounter
Admission: RE | Admit: 2014-12-08 | Discharge: 2014-12-08 | Disposition: A | Discharge status: Home / Self Care | Payer: 59 | Source: Ambulatory Visit | Attending: Psychiatry | Admitting: Psychiatry

## 2014-12-08 ENCOUNTER — Encounter: Payer: Self-pay | Admitting: Anesthesiology

## 2014-12-08 DIAGNOSIS — F332 Major depressive disorder, recurrent severe without psychotic features: Secondary | ICD-10-CM

## 2014-12-08 MED ORDER — DEXTROSE 5 % IV SOLN
250.0000 mL | Freq: Once | INTRAVENOUS | Status: AC
  Administered 2014-12-08: 250 mL via INTRAVENOUS

## 2014-12-08 MED ORDER — SUCCINYLCHOLINE CHLORIDE 20 MG/ML IJ SOLN
150.0000 mg | Freq: Once | INTRAMUSCULAR | Status: AC
  Administered 2014-12-08: 150 mg via INTRAVENOUS

## 2014-12-08 MED ORDER — LIDOCAINE HCL (CARDIAC) 20 MG/ML IV SOLN
4.0000 mg | Freq: Once | INTRAVENOUS | Status: AC
  Administered 2014-12-08: 4 mg via INTRAVENOUS

## 2014-12-08 MED ORDER — METHOHEXITAL SODIUM 100 MG/10ML IV SOSY
90.0000 mg | PREFILLED_SYRINGE | Freq: Once | INTRAVENOUS | Status: AC
  Administered 2014-12-08: 90 mg via INTRAVENOUS

## 2014-12-08 NOTE — Transfer of Care (Signed)
Immediate Anesthesia Transfer of Care Note  Patient: Brittany [name]  Procedure(s) Performed: * No procedures listed *  Patient Location: PACU  Anesthesia Type:General  Level of Consciousness: awake, alert , oriented and patient cooperative  Airway & Oxygen Therapy: Patient Spontanous Breathing and Patient connected to face mask oxygen  Post-op Assessment: Report given to RN, Post -op Vital signs reviewed and stable and Patient moving all extremities X 4  Post vital signs: Reviewed and stable  Last Vitals:  Filed Vitals:   12/08/14 1147  BP: 118/75  Pulse: 72  Temp: 37.8 C  Resp: 13    Complications: No apparent anesthesia complications

## 2014-12-08 NOTE — Anesthesia Postprocedure Evaluation (Signed)
  Anesthesia Post-op Note  Patient: Brittany [name]  Procedure(s) Performed: * No procedures listed *  Anesthesia type:General  Patient location: PACU  Post pain: Pain level controlled  Post assessment: Post-op Vital signs reviewed, Patient's Cardiovascular Status Stable, Respiratory Function Stable, Patent Airway and No signs of Nausea or vomiting  Post vital signs: Reviewed and stable  Last Vitals:  Filed Vitals:   12/08/14 1147  BP: 118/75  Pulse: 72  Temp: 37.8 C  Resp: 13    Level of consciousness: awake, alert  and patient cooperative  Complications: No apparent anesthesia complications

## 2014-12-08 NOTE — Procedures (Signed)
ECT SERVICES Physician's Interval Evaluation & Treatment Note  Patient Identification: Brittany [name] MRN:  [id number] Date of Evaluation:  12/08/2014 TX #: 8  MADRS:   MMSE:   P.E. Findings:  No medical change  Psychiatric Interval Note:  Mood continues to feel much improved. She is not aware of any decline. Memory and concentration coming back to normal  Subjective:  Patient is a 49 y.o. female seen for evaluation for Electroconvulsive Therapy. Patient wanted to discuss terminating treatment. We discussed various options and pros and cons. She agreed to a plan to return in 3 weeks for a scheduled follow-up maintenance treatment and then reassess the utility of maintenance after that. Otherwise continue current medicine and follow-up with primary psychiatrist  Treatment Summary:   [x]   Right Unilateral             []  Bilateral   % Energy : 0.3 ms 70%   Impedance: 1320 ohms  Seizure Energy Index: 14,844 V squared  Postictal Suppression Index: 76%  Seizure Concordance Index: 97%  Medications  Pre Shock: Xylocaine 4 mg, Brevital 90 mg, succinylcholine 150 mg  Post Shock: None  Seizure Duration: EMG 29 seconds, EEG 56 seconds   Comments: Follow-up 3 weeks as noted above   Lungs:  [x]   Clear to auscultation               []  Other:   Heart:    [x]   Regular rhythm             []  irregular rhythm    [x]   Previous H&P reviewed, patient examined and there are NO CHANGES                 []   Previous H&P reviewed, patient examined and there are changes noted.   [name], MD 8/26/201611:39 AM

## 2014-12-08 NOTE — Anesthesia Preprocedure Evaluation (Signed)
Anesthesia Evaluation  Patient identified by MRN, date of birth, ID band Patient awake    History of Anesthesia Complications Negative for: history of anesthetic complications  Airway Mallampati: II       Dental no notable dental hx.    Pulmonary sleep apnea , former smoker,          Cardiovascular hypertension, Pt. on medications Normal cardiovascular exam    Neuro/Psych    GI/Hepatic Neg liver ROS, hiatal hernia, GERD-  ,  Endo/Other  negative endocrine ROS  Renal/GU negative Renal ROS     Musculoskeletal  (+) Arthritis -,   Abdominal Normal abdominal exam  (+)   Peds  Hematology  (+) anemia ,   Anesthesia Other Findings   Reproductive/Obstetrics                             Anesthesia Physical Anesthesia Plan  ASA: II  Anesthesia Plan: General   Post-op Pain Management:    Induction: Intravenous  Airway Management Planned: Nasal Cannula  Additional Equipment:   Intra-op Plan:   Post-operative Plan:   Informed Consent: I have reviewed the patients History and Physical, chart, labs and discussed the procedure including the risks, benefits and alternatives for the proposed anesthesia with the patient or authorized representative who has indicated his/her understanding and acceptance.     Plan Discussed with: CRNA  Anesthesia Plan Comments:         Anesthesia Quick Evaluation

## 2014-12-20 ENCOUNTER — Ambulatory Visit (INDEPENDENT_AMBULATORY_CARE_PROVIDER_SITE_OTHER): Payer: 59 | Admitting: Clinical

## 2014-12-20 ENCOUNTER — Other Ambulatory Visit (HOSPITAL_COMMUNITY): Payer: Self-pay | Admitting: Psychiatry

## 2014-12-20 DIAGNOSIS — F4310 Post-traumatic stress disorder, unspecified: Secondary | ICD-10-CM | POA: Diagnosis not present

## 2014-12-20 DIAGNOSIS — F331 Major depressive disorder, recurrent, moderate: Secondary | ICD-10-CM | POA: Diagnosis not present

## 2014-12-20 NOTE — Addendum Note (Signed)
Encounter addended by: Kathyrn Drown, RN on: 12/20/2014  4:03 PM<BR>     Documentation filed: PRL Based Order Sets, Orders

## 2014-12-21 ENCOUNTER — Other Ambulatory Visit (HOSPITAL_COMMUNITY): Payer: Self-pay | Admitting: Psychiatry

## 2014-12-21 ENCOUNTER — Encounter (HOSPITAL_COMMUNITY): Payer: Self-pay | Admitting: Clinical

## 2014-12-21 NOTE — Telephone Encounter (Signed)
Patient currently getting treatment with another provider.

## 2014-12-21 NOTE — Progress Notes (Signed)
   THERAPIST PROGRESS NOTE  Session Time: 7:15 - 8:00  Participation Level: Active  Behavioral Response: NeatAlertNA  Type of Therapy: Individual Therapy  Treatment Goals addressed: improve psychiatric symptoms,  Healthy Coping Skills,   Interventions:  Motivational Interviewing,   Summary: Brittany [name] is a 49 y.o female who presents with   PTSD and Major depressive disorder, recurrent episode, moderate  Suicidal/Homicidal: No -without intent/plan  Therapist Response:  Brittany [name] met with clinician for an individual session. Brittany [name] discussed her psychiatric symptoms and her current life events. Brittany [name] and clinician reviewed where Brittany [name] had left off with in therapy. Clinician had not seen client since 10/18/14. Brittany [name] shared that the suicidal thoughts had increased and Brittany [name] had returned to inpatient care. Brittany [name] shared that while inpatient Brittany [name] had decided to begin ECT (Electroconvulsive therapy) Brittany [name] shared that Brittany [name] has been receiving ECT and has been experiencing positive results from it. Brittany [name] reported that since starting the ECT her suicidal thoughts have decreased. Brittany [name] shared that her husband and closest friend have both commented on her improvement. Brittany [name] shared Brittany [name] has also been experiencing some memory issues from the ECT  which frighten her some.  Brittany [name] stated some of her concerns about others knowing about the ECT. Brittany [name] and clinician discussed the therapy skills Brittany [name] has been practicing as well as skills needing reviewing and areas to grow. Brittany [name] share that sometime in the near future Brittany [name] would like to begin to discuss and process some of her PTSD memories. Brittany [name] agreed to practice some of her grounding and mindfulness skills until next session.  Plan: Return again in 1-2 weeks.  Diagnosis: Axis I: PTSD and Major depressive disorder, recurrent episode, moderate   [name] A, LCSW 12/21/2014

## 2014-12-26 NOTE — Addendum Note (Signed)
Encounter addended by: Kathyrn Drown, RN on: 12/26/2014 10:10 PM<BR>     Documentation filed: PRL Based Order Sets, Orders

## 2014-12-26 NOTE — Addendum Note (Signed)
Encounter addended by: Kathyrn Drown, RN on: 12/26/2014  9:46 PM<BR>     Documentation filed: PRL Based Order Sets, Orders

## 2014-12-27 ENCOUNTER — Other Ambulatory Visit: Payer: Self-pay

## 2014-12-27 NOTE — Addendum Note (Signed)
Encounter addended by: Kathyrn Drown, RN on: 12/27/2014  4:04 PM<BR>     Documentation filed: PRL Based Order Sets, Orders

## 2014-12-29 ENCOUNTER — Encounter
Admission: RE | Admit: 2014-12-29 | Discharge: 2014-12-29 | Disposition: A | Discharge status: Home / Self Care | Payer: 59 | Source: Ambulatory Visit | Attending: Internal Medicine | Admitting: Internal Medicine

## 2014-12-29 ENCOUNTER — Encounter: Payer: Self-pay | Admitting: Anesthesiology

## 2014-12-29 DIAGNOSIS — F332 Major depressive disorder, recurrent severe without psychotic features: Secondary | ICD-10-CM | POA: Insufficient documentation

## 2014-12-29 MED ORDER — LIDOCAINE HCL (CARDIAC) 20 MG/ML IV SOLN
4.0000 mg | Freq: Once | INTRAVENOUS | Status: AC
  Administered 2014-12-29: 4 mg via INTRAVENOUS

## 2014-12-29 MED ORDER — SUCCINYLCHOLINE CHLORIDE 20 MG/ML IJ SOLN
150.0000 mg | Freq: Once | INTRAMUSCULAR | Status: AC
  Administered 2014-12-29: 150 mg via INTRAVENOUS

## 2014-12-29 MED ORDER — METHOHEXITAL SODIUM 100 MG/10ML IV SOSY
90.0000 mg | PREFILLED_SYRINGE | Freq: Once | INTRAVENOUS | Status: AC
  Administered 2014-12-29: 90 mg via INTRAVENOUS

## 2014-12-29 MED ORDER — DEXTROSE 5 % IV SOLN
250.0000 mL | Freq: Once | INTRAVENOUS | Status: AC
  Administered 2014-12-29: 250 mL via INTRAVENOUS

## 2014-12-29 NOTE — Procedures (Signed)
ECT SERVICES Physician's Interval Evaluation & Treatment Note  Patient Identification: Brittany [name] MRN:  [id number] Date of Evaluation:  12/29/2014 TX #: 9  MADRS:   MMSE:   P.E. Findings:  No new physical complaints no new findings. Vital signs stable.  Psychiatric Interval Note:  Mood continues to feel good. She is back to work in functioning well. No suicidal ideation. No obvious decline. No memory impairment noted   Subjective:  Patient is a 49 y.o. female seen for evaluation for Electroconvulsive Therapy. No new complaints  Treatment Summary:   [x]   Right Unilateral             []  Bilateral   % Energy : 0.3 ms 70%   Impedance: 1120 ohms  Seizure Energy Index: 19,710 V squared  Postictal Suppression Index: 84%  Seizure Concordance Index: 96%  Medications  Pre Shock: Xylocaine 4 mg, Brevital 90 mg, succinylcholine 150 mg  Post Shock: None  Seizure Duration: 33 seconds by EMG, 73 seconds by EEG   Comments: Patient agrees to plan to return in 3 weeks which is October 7 for follow-up maintenance treatment   Lungs:  [x]   Clear to auscultation               []  Other:   Heart:    [x]   Regular rhythm             []  irregular rhythm    [x]   Previous H&P reviewed, patient examined and there are NO CHANGES                 []   Previous H&P reviewed, patient examined and there are changes noted.   [name], MD 9/16/201611:24 AM

## 2014-12-29 NOTE — Transfer of Care (Signed)
Immediate Anesthesia Transfer of Care Note  Patient: Brittany [name]  Procedure(s) Performed: ECT  Patient Location: PACU  Anesthesia Type:General  Level of Consciousness: sedated  Airway & Oxygen Therapy: Patient Spontanous Breathing and Patient connected to face mask oxygen  Post-op Assessment: Report given to RN  Post vital signs: Reviewed and stable  Last Vitals:  Filed Vitals:   12/29/14 1141  BP: 111/61  Pulse: 73  Temp: 99.1F  Resp: 18    Complications: No apparent anesthesia complications

## 2014-12-29 NOTE — Anesthesia Preprocedure Evaluation (Signed)
Anesthesia Evaluation  Patient identified by MRN, date of birth, ID band Patient awake    Reviewed: Allergy & Precautions, NPO status , Patient's Chart, lab work & pertinent test results  History of Anesthesia Complications Negative for: history of anesthetic complications  Airway Mallampati: II       Dental no notable dental hx.    Pulmonary sleep apnea , former smoker,           Cardiovascular hypertension, Pt. on medications Normal cardiovascular exam     Neuro/Psych    GI/Hepatic Neg liver ROS, hiatal hernia, GERD  ,  Endo/Other  negative endocrine ROS  Renal/GU negative Renal ROS     Musculoskeletal  (+) Arthritis ,   Abdominal Normal abdominal exam  (+)   Peds  Hematology  (+) anemia ,   Anesthesia Other Findings   Reproductive/Obstetrics                             Anesthesia Physical Anesthesia Plan  ASA: II  Anesthesia Plan: General   Post-op Pain Management:    Induction: Intravenous  Airway Management Planned:   Additional Equipment:   Intra-op Plan:   Post-operative Plan:   Informed Consent: I have reviewed the patients History and Physical, chart, labs and discussed the procedure including the risks, benefits and alternatives for the proposed anesthesia with the patient or authorized representative who has indicated his/her understanding and acceptance.     Plan Discussed with:   Anesthesia Plan Comments:         Anesthesia Quick Evaluation

## 2014-12-29 NOTE — Anesthesia Postprocedure Evaluation (Signed)
  Anesthesia Post-op Note  Patient: Brittany [name]  Procedure(s) Performed: ECT  Anesthesia type:General  Patient location: PACU  Post pain: Pain level controlled  Post assessment: Post-op Vital signs reviewed, Patient's Cardiovascular Status Stable, Respiratory Function Stable, Patent Airway and No signs of Nausea or vomiting  Post vital signs: Reviewed and stable  Last Vitals:  Filed Vitals:   12/29/14 1148  BP:   Pulse: 82  Temp:   Resp: 11    Level of consciousness: awake, alert  and patient cooperative  Complications: No apparent anesthesia complications

## 2014-12-29 NOTE — Anesthesia Procedure Notes (Signed)
Date/Time: 12/29/2014 11:29 AM Performed by: Dionne Bucy Pre-anesthesia Checklist: Patient identified, Emergency Drugs available, Suction available and Patient being monitored Patient Re-evaluated:Patient Re-evaluated prior to inductionOxygen Delivery Method: Circle system utilized Preoxygenation: Pre-oxygenation with 100% oxygen Intubation Type: IV induction Ventilation: Mask ventilation without difficulty and Mask ventilation throughout procedure Airway Equipment and Method: Bite block Placement Confirmation: positive ETCO2 Dental Injury: Teeth and Oropharynx as per pre-operative assessment

## 2015-01-03 ENCOUNTER — Ambulatory Visit (INDEPENDENT_AMBULATORY_CARE_PROVIDER_SITE_OTHER): Payer: 59 | Admitting: Clinical

## 2015-01-03 ENCOUNTER — Encounter (HOSPITAL_COMMUNITY): Payer: Self-pay | Admitting: Clinical

## 2015-01-03 ENCOUNTER — Telehealth (HOSPITAL_COMMUNITY): Payer: Self-pay | Admitting: *Deleted

## 2015-01-03 DIAGNOSIS — F331 Major depressive disorder, recurrent, moderate: Secondary | ICD-10-CM

## 2015-01-03 DIAGNOSIS — F4310 Post-traumatic stress disorder, unspecified: Secondary | ICD-10-CM | POA: Diagnosis not present
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
## 2015-01-03 NOTE — Telephone Encounter (Signed)
Called for prior authorization of ECT. Was given 6 sessions from todays date 9/21 Josem Kaufmann #[id number] per Rachael G. Must send clinicals for retro review of dates past to fax [phone]. Email sent to Nita Sells to fax clinicals.

## 2015-01-10 ENCOUNTER — Ambulatory Visit (INDEPENDENT_AMBULATORY_CARE_PROVIDER_SITE_OTHER): Payer: 59 | Admitting: Psychiatry

## 2015-01-10 ENCOUNTER — Encounter (HOSPITAL_COMMUNITY): Payer: Self-pay | Admitting: Psychiatry

## 2015-01-10 VITALS — BP 138/81 | HR 65 | Ht 70.0 in | Wt 207.0 lb

## 2015-01-10 DIAGNOSIS — F331 Major depressive disorder, recurrent, moderate: Secondary | ICD-10-CM

## 2015-01-10 DIAGNOSIS — F4310 Post-traumatic stress disorder, unspecified: Secondary | ICD-10-CM | POA: Diagnosis not present

## 2015-01-10 MED ORDER — DULOXETINE HCL 60 MG PO CPEP: 60.0000 mg | ORAL_CAPSULE | Freq: Two times a day (BID) | ORAL | Status: DC

## 2015-01-10 MED ORDER — QUETIAPINE FUMARATE 100 MG PO TABS: 100.0000 mg | ORAL_TABLET | Freq: Every day | ORAL | Status: DC
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
## 2015-01-17 ENCOUNTER — Ambulatory Visit (INDEPENDENT_AMBULATORY_CARE_PROVIDER_SITE_OTHER): Payer: 59 | Admitting: Clinical

## 2015-01-17 ENCOUNTER — Encounter (HOSPITAL_COMMUNITY): Payer: Self-pay | Admitting: Clinical

## 2015-01-17 DIAGNOSIS — F331 Major depressive disorder, recurrent, moderate: Secondary | ICD-10-CM | POA: Diagnosis not present

## 2015-01-17 DIAGNOSIS — F4310 Post-traumatic stress disorder, unspecified: Secondary | ICD-10-CM | POA: Diagnosis not present

## 2015-01-21 NOTE — Progress Notes (Signed)
   THERAPIST PROGRESS NOTE  Session Time: 7:03 -8:00  Participation Level: Active  Behavioral Response: NeatAlertDepressed  Type of Therapy: Individual Therapy  Treatment Goals addressed: improve psychiatric symptoms, Emotional Regulation Skills, Healthy Coping Skills, Improve Thoughts and Beliefs  Interventions: CBT and Motivational Interviewing, Grounding and Mindfulness Techniques  Summary: Brittany [name] is a 49 y.o female who presents with PTSD and Major depressive disorder, recurrent episode, moderate  Suicidal/Homicidal:  she reported that she had recently had suicidal thoughts but denied any plan to act on them. Brittany [name] shared she has a lot to live for and listed the people who are important to her. She stated she knew what to do if they increased - follow her safety plan  Summary: Brittany [name] is a 48 y.o. female who presents with PTSD and Major depressive disorder.   Therapist Response: Brittany [name] met with clinician for an individual session. Brittany [name] discussed her psychiatric symptoms and her current life events. Brittany [name] shared that her suicidal thoughts had returned. She reported that she thought they might have returned because of the increased stress she has been under the past week. She shared that she had a very challenging week. Her Mother in laws health has declined and the syblings had not been caring for her properly. Brittany [name] and her husband are now trying to figure out how to care for her. Brittany [name] said that there was a lot of hard feelings between all and she had been internalizing everything. Brittany [name] shared that it was difficult for her emotionally when their is turmoil. Client and clinician discussed the importance of taking care of her emotions while allowing others to be responsible for their own. Client and clinician discussed how to do so. Clinician asked what skills she had been using to regulate her emotions. She shared that the more stressed she became the more forgetful she was off taking  care of her self. She reported her anxiety and depression as high today. Client and clinician practiced a mindfulness technique together. Brittany [name] reported that it helped a lot, that she felt calmer and more centered. Client and clinician discussed the importance of daily care and taking a break to care for herself.   Plan: Return again in 1-2 weeks.  Diagnosis: Axis I: PTSD and Major depressive disorder    Axis     Sultan Pargas A, LCSW 01/17/15

## 2015-01-22 ENCOUNTER — Other Ambulatory Visit: Payer: Self-pay

## 2015-01-26 ENCOUNTER — Encounter: Payer: Self-pay | Admitting: *Deleted

## 2015-01-26 ENCOUNTER — Encounter
Admission: RE | Admit: 2015-01-26 | Discharge: 2015-01-26 | Disposition: A | Discharge status: Home / Self Care | Payer: 59 | Source: Ambulatory Visit | Attending: Psychiatry | Admitting: Psychiatry

## 2015-01-26 DIAGNOSIS — E7800 Pure hypercholesterolemia, unspecified: Secondary | ICD-10-CM | POA: Insufficient documentation

## 2015-01-26 DIAGNOSIS — F419 Anxiety disorder, unspecified: Secondary | ICD-10-CM | POA: Diagnosis not present

## 2015-01-26 DIAGNOSIS — Z87891 Personal history of nicotine dependence: Secondary | ICD-10-CM | POA: Insufficient documentation

## 2015-01-26 DIAGNOSIS — F4310 Post-traumatic stress disorder, unspecified: Secondary | ICD-10-CM | POA: Insufficient documentation

## 2015-01-26 DIAGNOSIS — I10 Essential (primary) hypertension: Secondary | ICD-10-CM | POA: Diagnosis not present

## 2015-01-26 DIAGNOSIS — F332 Major depressive disorder, recurrent severe without psychotic features: Secondary | ICD-10-CM

## 2015-01-26 DIAGNOSIS — K219 Gastro-esophageal reflux disease without esophagitis: Secondary | ICD-10-CM | POA: Insufficient documentation

## 2015-01-26 DIAGNOSIS — Z882 Allergy status to sulfonamides status: Secondary | ICD-10-CM | POA: Insufficient documentation

## 2015-01-26 MED ORDER — DEXTROSE 5 % IV SOLN
250.0000 mL | Freq: Once | INTRAVENOUS | Status: AC
  Administered 2015-01-26: 250 mL via INTRAVENOUS

## 2015-01-26 MED ORDER — LIDOCAINE HCL (CARDIAC) 20 MG/ML IV SOLN
4.0000 mg | Freq: Once | INTRAVENOUS | Status: AC
  Administered 2015-01-26: 4 mg via INTRAVENOUS

## 2015-01-26 MED ORDER — SUCCINYLCHOLINE CHLORIDE 20 MG/ML IJ SOLN
150.0000 mg | Freq: Once | INTRAMUSCULAR | Status: AC
  Administered 2015-01-26: 150 mg via INTRAVENOUS

## 2015-01-26 MED ORDER — METHOHEXITAL SODIUM 100 MG/10ML IV SOSY
90.0000 mg | PREFILLED_SYRINGE | Freq: Once | INTRAVENOUS | Status: AC
Start: 2015-01-26 — End: 2015-01-26
  Administered 2015-01-26: 90 mg via INTRAVENOUS

## 2015-01-26 NOTE — Anesthesia Procedure Notes (Signed)
Date/Time: 01/26/2015 10:37 AM Performed by: Dionne Bucy Pre-anesthesia Checklist: Patient identified, Emergency Drugs available, Suction available and Patient being monitored Patient Re-evaluated:Patient Re-evaluated prior to inductionOxygen Delivery Method: Circle system utilized Preoxygenation: Pre-oxygenation with 100% oxygen Intubation Type: IV induction Ventilation: Mask ventilation without difficulty and Mask ventilation throughout procedure Airway Equipment and Method: Bite block Placement Confirmation: positive ETCO2 Dental Injury: Teeth and Oropharynx as per pre-operative assessment

## 2015-01-26 NOTE — Transfer of Care (Signed)
Immediate Anesthesia Transfer of Care Note  Patient: Brittany [name]  Procedure(s) Performed: ECT  Patient Location: PACU  Anesthesia Type:General  Level of Consciousness: sedated  Airway & Oxygen Therapy: Patient Spontanous Breathing and Patient connected to face mask oxygen  Post-op Assessment: Report given to RN  Post vital signs: Reviewed and stable  Last Vitals:  Filed Vitals:   01/26/15 1050  BP: 108/74  Pulse: 69  Temp: 98.52F F  Resp: 12    Complications: No apparent anesthesia complications

## 2015-01-26 NOTE — H&P (Signed)
Eiliana WESTYN KEATLEY is an 49 y.o. female.   Chief Complaint: Patient has no new complaint. Mood continues to be good. Functioning very well at home. HPI: No interval worsening. Tolerating maintenance ECT in maintaining good benefit  Past Medical History  Diagnosis Date  . Migraine   . Hypertension   . Hypercholesterolemia   . Depression   . Morbid obesity ([place])   . Sleep apnea     no CPAP machine   . H/O hiatal hernia   . GERD (gastroesophageal reflux disease)     hx of   . Arthritis     hips, knees and hands   . Anxiety   . PTSD (post-traumatic stress disorder)     Past Surgical History  Procedure Laterality Date  . Foot surgery    . Breath tek h pylori  05/03/2012    Procedure: BREATH TEK H PYLORI;  Surgeon: [name], MD;  Location: [name];  Service: General;  Laterality: N/A;  . Right tube and ovary removed     . Abdominal hysterectomy  1997 & 2006  . Colonoscopy      hx of benign polyps   . 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 Upper gi endoscopy N/A 07/05/2012    Procedure: UPPER GI ENDOSCOPY;  Surgeon: [name], MD;  Location: [facility];  Service: General;  Laterality: N/A;  . Laparoscopic lysis of adhesions N/A 07/05/2012    Procedure: LAPAROSCOPIC LYSIS OF ADHESIONS;  Surgeon: [name], MD;  Location: [facility];  Service: General;  Laterality: N/A;  repair of abdominal wall hernia  . Irrigation and debridement abscess N/A 01/01/2014    Procedure: IRRIGATION AND DEBRIDEMENT ABSCESS;  Surgeon: [name], MD;  Location: [facility];  Service: General;  Laterality: N/A;  . Gastric bypass      Family History  Problem Relation Age of Onset  . Cancer Mother     colon  . Cancer Maternal Aunt     breast  . Depression Father   . Depression Sister   . Post-traumatic stress disorder Sister   . Alcohol abuse Paternal Uncle   .  Bipolar disorder Cousin   . Anxiety disorder Sister    Social History:  reports that she quit smoking about 10 years ago. Her smoking use included Cigars. She has never used smokeless tobacco. She reports that she does not drink alcohol or use illicit drugs.  Allergies:  Allergies  Allergen Reactions  . Amoxicillin Hives  . Bactrim [Sulfamethoxazole-Trimethoprim] Hives and Rash  . Lamictal [Lamotrigine] Hives and Rash     (Not in a hospital admission)  No results found for this or any previous visit (from the past 48 hour(s)). No results found.  Review of Systems  Constitutional: Negative.   HENT: Negative.   Eyes: Negative.   Respiratory: Negative.   Cardiovascular: Negative.   Gastrointestinal: Negative.   Musculoskeletal: Negative.   Skin: Negative.   Neurological: Negative.   Psychiatric/Behavioral: Negative for depression, suicidal ideas, hallucinations, memory loss and substance abuse. The patient is not nervous/anxious and does not have insomnia.     Blood pressure 125/75, pulse 58, temperature 96.9 F (36.1 C), temperature source Tympanic, weight 94.348 kg (208 lb), SpO2 97 %. Physical Exam  Nursing note and vitals reviewed. Constitutional: She appears well-developed and well-nourished.  HENT:  Head: Normocephalic and atraumatic.  Eyes: Conjunctivae are normal. Pupils are equal, round, and reactive to light.  Neck: Normal range of motion.  Cardiovascular: Normal rate, regular rhythm and normal heart sounds.   Respiratory: Effort normal and breath sounds normal. No respiratory distress. She has no wheezes. She has no rales.  GI: Soft.  Musculoskeletal: Normal range of motion.  Neurological: She is alert.  Skin: Skin is warm and dry.  Psychiatric: She has a normal mood and affect. Her behavior is normal. Judgment and thought content normal.     Assessment/Plan We discussed various options and agreed to at least for the next treatment have her follow-up in 3  weeks.  John Clapacs 01/26/2015, 10:30 AM

## 2015-01-26 NOTE — Anesthesia Postprocedure Evaluation (Signed)
  Anesthesia Post-op Note  Patient: Brittany [name]  Procedure(s) Performed: * No procedures listed *  Anesthesia type:General  Patient location: PACU  Post pain: Pain level controlled  Post assessment: Post-op Vital signs reviewed, Patient's Cardiovascular Status Stable, Respiratory Function Stable, Patent Airway and No signs of Nausea or vomiting  Post vital signs: Reviewed and stable  Last Vitals:  Filed Vitals:   01/26/15 1100  BP: 109/80  Pulse: 80  Temp:   Resp: 14    Level of consciousness: awake, alert  and patient cooperative  Complications: No apparent anesthesia complications

## 2015-01-26 NOTE — Anesthesia Preprocedure Evaluation (Signed)
Anesthesia Evaluation  Patient identified by MRN, date of birth, ID band Patient awake    Reviewed: Allergy & Precautions, NPO status , Patient's Chart, lab work & pertinent test results  Airway Mallampati: I  TM Distance: >3 FB Neck ROM: Full    Dental  (+) Teeth Intact   Pulmonary sleep apnea , former smoker,  OSA, no CPAP, and may  Have resolved with weight loss post gastric bypass.   Pulmonary exam normal        Cardiovascular hypertension, Normal cardiovascular exam     Neuro/Psych    GI/Hepatic GERD  Controlled,  Endo/Other    Renal/GU      Musculoskeletal   Abdominal (+) + obese,  Abdomen: soft.    Peds  Hematology   Anesthesia Other Findings   Reproductive/Obstetrics                             Anesthesia Physical Anesthesia Plan  ASA: III  Anesthesia Plan: General   Post-op Pain Management:    Induction: Intravenous  Airway Management Planned: Mask  Additional Equipment:   Intra-op Plan:   Post-operative Plan:   Informed Consent: I have reviewed the patients History and Physical, chart, labs and discussed the procedure including the risks, benefits and alternatives for the proposed anesthesia with the patient or authorized representative who has indicated his/her understanding and acceptance.     Plan Discussed with: CRNA  Anesthesia Plan Comments:         Anesthesia Quick Evaluation

## 2015-01-26 NOTE — Procedures (Signed)
ECT SERVICES Physician's Interval Evaluation & Treatment Note  Patient Identification: Brittany [name] MRN:  [id number] Date of Evaluation:  01/26/2015 TX #: 10  MADRS: 13  MMSE: 30  P.E. Findings:  No new physical complaints no change to physical exam vital signs stable  Psychiatric Interval Note:  Mood has remained good much improved of any memory problems functioning very well.  Subjective:  Patient is a 49 y.o. female seen for evaluation for Electroconvulsive Therapy. Patient has no subjective complaints.  Treatment Summary:   [x]   Right Unilateral             []  Bilateral   % Energy : 0.3 ms 70%   Impedance: 1200 ohms  Seizure Energy Index: 23,874 V squared  Postictal Suppression Index: 95%  Seizure Concordance Index: 98%  Medications  Pre Shock: Xylocaine 4 mg, Brevital 90 mg, succinylcholine 150 mg  Post Shock:    Seizure Duration: 37 seconds by EMG, 64 seconds by EEG   Comments: Patient was noted during the immediate post seizure. To have some periods of bradycardia but did not go below 45 bpm and maintained good blood pressure and oxygenation. No intervention at this time. Last EKG was reviewed. At this point her EKG appears to come back to normal. We have decided to have follow-up treatment in 3 weeks. No other change to plan.   Lungs:  [x]   Clear to auscultation               []  Other:   Heart:    [x]   Regular rhythm             []  irregular rhythm    [x]   Previous H&P reviewed, patient examined and there are NO CHANGES                 []   Previous H&P reviewed, patient examined and there are changes noted.   [name], MD 10/14/201610:32 AM

## 2015-02-07 ENCOUNTER — Ambulatory Visit (INDEPENDENT_AMBULATORY_CARE_PROVIDER_SITE_OTHER): Payer: 59 | Admitting: Clinical

## 2015-02-07 ENCOUNTER — Encounter (HOSPITAL_COMMUNITY): Payer: Self-pay | Admitting: Clinical

## 2015-02-07 DIAGNOSIS — F4310 Post-traumatic stress disorder, unspecified: Secondary | ICD-10-CM | POA: Diagnosis not present

## 2015-02-07 DIAGNOSIS — F331 Major depressive disorder, recurrent, moderate: Secondary | ICD-10-CM

## 2015-02-07 NOTE — Progress Notes (Signed)
   THERAPIST PROGRESS NOTE  Session Time: 7:03 - 8:00  Participation Level: Active  Behavioral Response: Neat and Well GroomedAlertDepressed  Type of Therapy: Individual Therapy  Treatment Goals addressed: improve psychiatric symptoms, Emotional Regulation Skills, Healthy Coping Skills, Improve Thoughts and Beliefs   Interventions: CBT and Motivational Interviewing, Grounding and Mindfulness Techniques  Summary: Brittany [name] is a 48 y.o female who presents with   PTSD and Major depressive disorder, recurrent episode, moderate  Suicidal/Homicidal: No -without intent/plan  Therapist Response:  Brittany [name] met with clinician for an individual session. Brittany [name] discussed her psychiatric symptoms, her current life events, and her homework. Brittany [name] shared that she has completed her cycle of ECT. She shared that she was happy to report that the suicidal thoughts are absent. She shared that she was concerned however about her memory. She shared about instances at work at noot being able to remember how to do certain tasks. She shared that she was going back and looking at her past work to help her remember. Clinician encouraged her to continue to do so that it was a good coping strategy and may also help her reconnect her memory of such tasks. Brittany [name] shared that she felt very isolated at work since prior to her Depression getting so bad, she had been the person people would go to for questions. Now the staff goes to someone else. Brittany [name] shared she felt like it was because they don't trust her to know things. Client and clinician discussed possible other reasons for this. She shared that they may not want to cause her extra stress. She shared that she had been isolating and not interjecting into conversations because she is embarrassed because she sometimes has difficulty sending her words. Zaley shared that she had been feeling like no one cared. When asked open ended questions Brittany [name] was then able to identify evidence that  did not support this belief. Brittany [name] had the insight that she may be sending signals that she does not want to be approached. Client and clinician discussed that this was something that was in her power to change. Brittany [name] identified ways she could make herself more approachable. Clinician encouraged her to do so. Client and clinician discussed the fact that Brittany [name] has identified interacting with others as something that has helpped improve her mood in the past. Brittany [name] shared that she has not been doing the other thing she was doing prior to improve her mood - walking at lunch. Brittany [name] agreed to try to interact more and to walk at lunch until next session. Brittany [name] also discussed her situation with her mother in law and her tendency to ruminate on the words and behaviors of her brother in laws. Brittany [name] used cbt techniques to help her realize that their behaviors were not personal to her but related more to their fears and own misbehaviors. She was able to create an image that would help her remember this. Brittany [name] and clinician discussed her grounding and mindfulness techniques which she agreed to practice until next session.    Plan: Return again in 1 weeks.  Diagnosis: Axis I: PTSD and Major depressive disorder, recurrent episode, moderate    , A, LCSW 02/07/2015  

## 2015-02-21 ENCOUNTER — Encounter (HOSPITAL_COMMUNITY): Payer: Self-pay | Admitting: Psychiatry

## 2015-02-21 ENCOUNTER — Ambulatory Visit (INDEPENDENT_AMBULATORY_CARE_PROVIDER_SITE_OTHER): Payer: 59 | Admitting: Psychiatry

## 2015-02-21 VITALS — BP 124/87 | HR 68 | Ht 70.0 in | Wt 216.4 lb

## 2015-02-21 DIAGNOSIS — F4310 Post-traumatic stress disorder, unspecified: Secondary | ICD-10-CM | POA: Diagnosis not present

## 2015-02-21 DIAGNOSIS — F331 Major depressive disorder, recurrent, moderate: Secondary | ICD-10-CM | POA: Diagnosis not present

## 2015-02-21 MED ORDER — QUETIAPINE FUMARATE 100 MG PO TABS: 100.0000 mg | ORAL_TABLET | Freq: Every day | ORAL | Status: DC

## 2015-02-21 MED ORDER — DULOXETINE HCL 60 MG PO CPEP: 60.0000 mg | ORAL_CAPSULE | Freq: Two times a day (BID) | ORAL | Status: DC
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
## 2015-03-06 ENCOUNTER — Ambulatory Visit (INDEPENDENT_AMBULATORY_CARE_PROVIDER_SITE_OTHER): Payer: 59 | Admitting: Clinical

## 2015-03-06 ENCOUNTER — Encounter (HOSPITAL_COMMUNITY): Payer: Self-pay | Admitting: Clinical

## 2015-03-06 DIAGNOSIS — F331 Major depressive disorder, recurrent, moderate: Secondary | ICD-10-CM

## 2015-03-06 DIAGNOSIS — F4310 Post-traumatic stress disorder, unspecified: Secondary | ICD-10-CM

## 2015-03-06 NOTE — Progress Notes (Signed)
   THERAPIST PROGRESS NOTE  Session Time: 7:02 - 8:00   Participation Level: Active  Behavioral Response: CasualAlertNA  Type of Therapy: Individual Therapy  Treatment Goals addressed: improve psychiatric symptoms, Emotional Regulation Skills, Healthy Coping Skills, Improve Thoughts and Beliefs   Interventions: CBT and Motivational Interviewing, Grounding and Mindfulness Techniques  Summary: Brittany [name] is a 49 y.o female who presents with   PTSD and Major depressive disorder, recurrent episode, moderate  Suicidal/Homicidal: No -without intent/plan  Therapist Response:  Brittany [name] met with clinician for an individual session. Brittany [name] discussed her psychiatric symptoms, her current life events, and her homework. Brittany [name] shared that she continues to practice her grounding and mindfulness techniques. Brittany [name] sheared that she has been doing better about setting boundaries with her inlaws. She stated that her mother in law is living with them and is doing well. She stated that helping care for her has helped Brittany [name] to focus outside of her own experience which in turn has helped her mood. She also shared that she believes the Brittany [name] helped because she has not had any suicidal thoughts in a while. She shared that she took to heart the discussion at last session and has worked t be more social at work. She shared that it is sometimes a struggle but that the benefit of improved mood and thoughts is worth the effort. 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 She shared that she had to put her dog down which caused her sadness and grief but that she did not "spiral into dark depression" Client and clinician discussed the improvements [name] was experiencing and identified the things she was doing to create the changes. [name] shared her thoughts and insights. [name] and clinician discussed the possibility of increasing her homework again.She  agreed to continue to practice her grounding and mindfulness techniques, to intentionally be more social, and to begin to work in her gratitude journal again.       Plan: Return again in 1 weeks.  Diagnosis: Axis I: PTSD and Major depressive disorder, recurrent episode, moderate    Chantee Cerino A, LCSW 03/06/2015

## 2015-03-12 ENCOUNTER — Telehealth (HOSPITAL_COMMUNITY): Payer: Self-pay | Admitting: *Deleted

## 2015-03-12 NOTE — Telephone Encounter (Signed)
Reviewed notes for insurance purpose to find out about retro-authorization not being approved.

## 2015-03-22 ENCOUNTER — Encounter (HOSPITAL_COMMUNITY): Payer: Self-pay | Admitting: Clinical

## 2015-03-22 ENCOUNTER — Ambulatory Visit (INDEPENDENT_AMBULATORY_CARE_PROVIDER_SITE_OTHER): Payer: 59 | Admitting: Clinical

## 2015-03-22 DIAGNOSIS — F4310 Post-traumatic stress disorder, unspecified: Secondary | ICD-10-CM | POA: Diagnosis not present

## 2015-03-22 DIAGNOSIS — F331 Major depressive disorder, recurrent, moderate: Secondary | ICD-10-CM | POA: Diagnosis not present

## 2015-03-23 ENCOUNTER — Telehealth (HOSPITAL_COMMUNITY): Payer: Self-pay

## 2015-03-26 ENCOUNTER — Telehealth (HOSPITAL_COMMUNITY): Payer: Self-pay

## 2015-03-26 NOTE — Telephone Encounter (Signed)
Medication management - Pt with increased + suicidal ideations, no homicidal ideatons and denies any current plan or intent to harm self with no current plan. Wanted to see if Dr. Adele Schilder had an earlier appt. or would call her back to discuss possible needed medication adjustments or changes or to be worked in to see Dr. Adele Schilder.  Informed Dr. Adele Schilder would be back in the office on 03/27/15 and would pass on message about her reported recent increased and return to suicidal thoughts, but no current plan or intent.  Informed patient Dr. Adele Schilder currently has no openings to get in immediately but agreed to place on call back list if someone no showed and arranged this wit [facility].  Agreed to request Dr. Adele Schilder call patient back on 03/27/15 to discuss increased symptoms and possible needed medication adjustments per patient's report and patient agreed with plan.  Patient again denied any current plan or intent to harm self or others and stated multiple times she was safe, could keep herself safe and would call back on 03/27/15 afternoon if had not heard back by then.

## 2015-03-26 NOTE — Progress Notes (Signed)
   THERAPIST PROGRESS NOTE  Session Time: 7:07 -8:04  Participation Level: Active  Behavioral Response: NeatAlertDepressed  Type of Therapy: Individual Therapy  Treatment Goals addressed: improve psychiatric symptoms, Emotional Regulation Skills, Healthy Coping Skills,   Interventions: CBT and Motivational Interviewing, Grounding and Mindfulness Techniques  Summary: Crystalynn ARDEAN SIMONICH is a 49 y.o female who presents with   PTSD and Major depressive disorder, recurrent episode, moderate  Suicidal/Homicidal: No -without intent/plan  Therapist Response:  Camelle met with clinician for an individual session. Zoria discussed her psychiatric symptoms, her current life events, and her homework. Leela shared that she has been practicing her grounding and mindfulness techniques. She shared that she has also been working to interact more socially. She shared some of her successes as well as some of the challenges she has been having. Aza shared that she has been struggling with "sensory overload." She shared that noises and lights  seem to irritate her more and she struggles not to be irritated and overwhelmed. She stated that she was taking prednisone for a skin rash and believes that it is causing this reaction. Client and clinician discussed healthy ways for Jae to manage this additional stress. Client and clinician reviewed and practiced some healthy grounding techniques. Client and clinician discussed mindfulness techniques, taking a break as well as choosing where to put her focus. Kemi and clinician discussed her progress in shifting her focus with her in-laws. She shared that she has been better able to focus on her goals and own needs rather than react and try to make everything "okay" for everybody else while neglecting herself. Client and clinician discussed how she could apply this same skills to other situations in her life. Tyrhonda shared that she is still struggling with memory issues since the ETC, she  shared that she sometimes has to review her past work to remember how to do things. Clinician shared that this was a good coping strategy and that it was a great way to help her memory come back.    Plan: Return again in 1 weeks.  Diagnosis: Axis I: PTSD and Major depressive disorder, recurrent episode, moderate       Gemma Payor 03/22/15

## 2015-03-27 NOTE — Telephone Encounter (Signed)
D:  Per Dr. Marguerite Olea request, writer called patient but there was no answer.  A:  Left vm for pt to call re: MH-IOP.  Encouraged pt to call for a start date.  Will inform Dr. Adele Schilder and Beather Arbour, RN.

## 2015-03-27 NOTE — Telephone Encounter (Signed)
She needs IOP. Please call her back for IOP.

## 2015-03-27 NOTE — Telephone Encounter (Signed)
Met with Dr. Adele Schilder to discuss patient's status from discussion with her on the phone 03/26/15.  Dr. Adele Schilder discussed with Dr. Lovena Le and will refer patient to IOP and Dr. Lovena Le will also evaluate for return to maintenance ECT if needed.  Left patient a message to call us back to discuss plan and to have her coordinated with IOP for restart with their program.  Requested she call back to discuss plans and to assist with set up if she agrees.

## 2015-04-02 ENCOUNTER — Encounter (HOSPITAL_COMMUNITY): Payer: Self-pay | Admitting: Psychiatry

## 2015-04-02 ENCOUNTER — Other Ambulatory Visit (HOSPITAL_COMMUNITY): Payer: 59 | Attending: Psychiatry | Admitting: Psychiatry

## 2015-04-02 DIAGNOSIS — F331 Major depressive disorder, recurrent, moderate: Secondary | ICD-10-CM

## 2015-04-02 DIAGNOSIS — F419 Anxiety disorder, unspecified: Secondary | ICD-10-CM | POA: Insufficient documentation

## 2015-04-02 DIAGNOSIS — I10 Essential (primary) hypertension: Secondary | ICD-10-CM | POA: Insufficient documentation

## 2015-04-02 DIAGNOSIS — K219 Gastro-esophageal reflux disease without esophagitis: Secondary | ICD-10-CM | POA: Diagnosis not present

## 2015-04-02 DIAGNOSIS — F332 Major depressive disorder, recurrent severe without psychotic features: Secondary | ICD-10-CM | POA: Insufficient documentation

## 2015-04-02 DIAGNOSIS — F4310 Post-traumatic stress disorder, unspecified: Secondary | ICD-10-CM | POA: Insufficient documentation

## 2015-04-02 DIAGNOSIS — Z9884 Bariatric surgery status: Secondary | ICD-10-CM | POA: Insufficient documentation

## 2015-04-02 DIAGNOSIS — Z87891 Personal history of nicotine dependence: Secondary | ICD-10-CM | POA: Diagnosis not present

## 2015-04-02 DIAGNOSIS — R45851 Suicidal ideations: Secondary | ICD-10-CM | POA: Insufficient documentation

## 2015-04-02 DIAGNOSIS — E7800 Pure hypercholesterolemia, unspecified: Secondary | ICD-10-CM | POA: Diagnosis not present

## 2015-04-02 DIAGNOSIS — G4700 Insomnia, unspecified: Secondary | ICD-10-CM | POA: Insufficient documentation
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
## 2015-04-03 ENCOUNTER — Ambulatory Visit (HOSPITAL_COMMUNITY): Payer: 59 | Admitting: Clinical

## 2015-04-03 ENCOUNTER — Encounter (HOSPITAL_COMMUNITY): Payer: Self-pay | Admitting: Psychiatry

## 2015-04-03 ENCOUNTER — Other Ambulatory Visit (HOSPITAL_COMMUNITY): Payer: 59 | Admitting: Psychiatry

## 2015-04-03 DIAGNOSIS — F331 Major depressive disorder, recurrent, moderate: Secondary | ICD-10-CM

## 2015-04-03 DIAGNOSIS — F4310 Post-traumatic stress disorder, unspecified: Secondary | ICD-10-CM

## 2015-04-03 DIAGNOSIS — F332 Major depressive disorder, recurrent severe without psychotic features: Secondary | ICD-10-CM | POA: Diagnosis not present

## 2015-04-03 NOTE — Progress Notes (Signed)
Psychiatric Initial Adult Assessment   Patient Identification: Brittany [name] MRN:  [id number] Date of Evaluation:  04/03/2015 Referral Source: self Chief Complaint:   Visit Diagnosis: major depression, severe, recurrent without psychotic features Diagnosis:   Patient Active Problem List   Diagnosis Date Noted  . Major depressive disorder, recurrent, severe without psychotic features ([place]) [F33.2]   . MDD (major depressive disorder) ([place]) [F32.9] 10/30/2014  . MDD (major depressive disorder), recurrent severe, without psychosis ([place]) [F33.2]   . Suicidal ideation [R45.851] 10/05/2014  . Depression [F32.9] 06/12/2014  . Rash [R21] 01/04/2014  . Normocytic anemia [D64.9] 01/04/2014  . DJD (degenerative joint disease) [M19.90] 01/04/2014  . Hypertension [I10] 01/04/2014  . Dyslipidemia [E78.5] 01/04/2014  . GERD (gastroesophageal reflux disease) [K21.9] 01/04/2014  . Obstructive sleep apnea [G47.33] 01/04/2014  . 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 Abdominal abscess ([place]) [K65.1] 01/01/2014  . Wound infection after surgery [T81.4XXA] 01/01/2014  . PTSD (post-traumatic stress disorder) [F43.10] 11/23/2013  . Severe recurrent major depression without psychotic features ([place]) [F33.2] 11/22/2013  . 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 Morbid obesity, Weight - 312, BMI - 45.2 [E66.01] 04/21/2012   History of Present Illness:  Ms Bobier is known to the IOP having been with [place] before.  Since her last stay she has undergone a round of ECT with good results but about 4 months later she is experiencing depression again and a Booster round of ECT is recommended.  She really does not want to do that if she can avoid it due to the inconvenience and the memory problems.  She is trying to avoid inpatient as well even though that has helped in the past.  She is sad, no energy, no motivation or interest, wanting to sleep but waking up in the night unable  to sleep, tired, irritable, feeling guilty and feeling like giving up.  She is ruminating on suicidal thoughts again and cannot get them out of her mind. She takes her medications religiously and they help but the depression is still severe.  She has had many trials of medications and therapy without much help over the years.  She has no history of seizures and no metal implants and consequently qualifies for [place].  She will review the material and decide if that is what she wants to do next but in the meantime will try IOP as that was helpful in the past.  No changes have occurred to precipitate the depression and in fact things have been going well overall she says. Elements:  Location:  depression. Quality:  daily sadness and no energy or pleasure. Severity:  suicidal rumination. Timing:  no precipitants after a good 4 month response to ECT. Duration:  lifelong. Context:  as above. Associated Signs/Symptoms: Depression Symptoms:  depressed mood, anhedonia, insomnia, hypersomnia, fatigue, feelings of worthlessness/guilt, difficulty concentrating, hopelessness, impaired memory, recurrent thoughts of death, suicidal thoughts without plan, loss of energy/fatigue, disturbed sleep, (Hypo) Manic Symptoms:  Irritable Mood, Anxiety Symptoms:  Excessive Worry, Psychotic Symptoms:  none PTSD Symptoms: Negative  Past Medical History:  Past Medical History  Diagnosis Date  . Migraine   . Hypertension   . Hypercholesterolemia   . Depression   . Morbid obesity ([place])   . Sleep apnea     no CPAP machine   . H/O hiatal hernia   . GERD (gastroesophageal reflux disease)     hx of   . Arthritis     hips, knees and  hands   . Anxiety   . PTSD (post-traumatic stress disorder)     Past Surgical History  Procedure Laterality Date  . Foot surgery    . Breath tek h pylori  05/03/2012    Procedure: BREATH TEK H PYLORI;  Surgeon: [name], MD;  Location: [name];  Service: General;   Laterality: N/A;  . Right tube and ovary removed     . Abdominal hysterectomy  1997 & 2006  . Colonoscopy      hx of benign polyps   . 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 Upper gi endoscopy N/A 07/05/2012    Procedure: UPPER GI ENDOSCOPY;  Surgeon: [name], MD;  Location: [facility];  Service: General;  Laterality: N/A;  . Laparoscopic lysis of adhesions N/A 07/05/2012    Procedure: LAPAROSCOPIC LYSIS OF ADHESIONS;  Surgeon: [name], MD;  Location: [facility];  Service: General;  Laterality: N/A;  repair of abdominal wall hernia  . Irrigation and debridement abscess N/A 01/01/2014    Procedure: IRRIGATION AND DEBRIDEMENT ABSCESS;  Surgeon: [name], MD;  Location: [facility];  Service: General;  Laterality: N/A;  . Gastric bypass     Family History:  Family History  Problem Relation Age of Onset  . Cancer Mother     colon  . Cancer Maternal Aunt     breast  . Depression Father   . Depression Sister   . Post-traumatic stress disorder Sister   . Alcohol abuse Paternal Uncle   . Bipolar disorder Cousin   . Anxiety disorder Sister    Social History:   Social History   Social History  . Marital Status: Married    Spouse Name: N/A  . Number of Children: N/A  . Years of Education: N/A   Social History Main Topics  . Smoking status: Former Smoker    Types: Cigars    Quit date: 04/27/2004  . Smokeless tobacco: Never Used  . Alcohol Use: No     Comment: Occasional use  . Drug Use: No  . Sexual Activity: Yes   Other Topics Concern  . None   Social History Narrative   Additional Social History: none  Musculoskeletal: Strength & Muscle Tone: within normal limits Gait & Station: normal Patient leans: N/A  Psychiatric Specialty Exam: HPI  ROS  There were no vitals taken for this visit.There is no weight on file to calculate BMI.   General Appearance: Well Groomed  Eye Contact:  Good  Speech:  Clear and Coherent  Volume:  Normal  Mood:  Depressed  Affect:  Congruent  Thought Process:  Coherent and Logical  Orientation:  Full (Time, Place, and Person)  Thought Content:  Negative  Suicidal Thoughts:  Yes.  with intent/plan  Homicidal Thoughts:  No  Memory:  Immediate;   Good Recent;   Good Remote;   Good  Judgement:  Good  Insight:  Good  Psychomotor Activity:  Normal  Concentration:  Good  Recall:  Good  Fund of Knowledge:Good  Language: Good  Akathisia:  Negative  Handed:  Right  AIMS (if indicated):  0  Assets:  Communication Skills Desire for Improvement Financial Resources/Insurance Housing Intimacy Leisure Time Physical Health Resilience Social Support Talents/Skills Transportation Vocational/Educational  ADL's:  Intact  Cognition: WNL  Sleep:  poor   Is the patient at risk to  self?  Yes.  has no desire to kill herself and wants very much to feel better Has the patient been a risk to self in the past 6 months?  Yes.   Has the patient been a risk to self within the distant past?  Yes.   Is the patient a risk to others?  No. Has the patient been a risk to others in the past 6 months?  No. Has the patient been a risk to others within the distant past?  No.  Allergies:   Allergies  Allergen Reactions  . Amoxicillin Hives  . Bactrim [Sulfamethoxazole-Trimethoprim] Hives and Rash  . Lamictal [Lamotrigine] Hives and Rash   Current Medications: Current Outpatient Prescriptions  Medication Sig Dispense Refill  . calcium-vitamin D (OSCAL WITH D) 500-200 MG-UNIT per tablet Take 3 tablets by mouth every morning.    . cholecalciferol (VITAMIN D) 1000 UNITS tablet Take 1 tablet (1,000 Units total) by mouth daily.    . DULoxetine (CYMBALTA) 60 MG capsule Take 1 capsule (60 mg total) by mouth 2 (two) times daily. 60 capsule 1  . loratadine (CLARITIN) 10 MG tablet Take 1 tablet (10 mg total) by  mouth daily.    . Multiple Vitamins-Minerals (MULTIVITAMIN WITH MINERALS) tablet Take 1 tablet by mouth daily.    . QUEtiapine (SEROQUEL) 100 MG tablet Take 1 tablet (100 mg total) by mouth at bedtime. 30 tablet 1  . vitamin B-12 (CYANOCOBALAMIN) 500 MCG tablet Take 500 mcg by mouth daily.     No current facility-administered medications for this visit.    Previous Psychotropic Medications: Yes   Substance Abuse History in the last 12 months:  No.  Consequences of Substance Abuse: Negative  Medical Decision Making:  Established Problem, Worsening (2)  Treatment Plan Summary: daily group therapy.  suggested [place] and my evaluation concludes she could benefit     Donnelly Angelica 12/20/20161:36 PM

## 2015-04-03 NOTE — Progress Notes (Signed)
    Daily Group Progress Note  Program: IOP  Group Time: 9:00-10:30  Participation Level: Active  Behavioral Response: Appropriate  Type of Therapy:  Group Therapy  Summary of Progress: Pt. Smiled and talked appropriately. Pt. Discussed developing new interest in raising chickens after her children left home and joy that she experiences from being a grandmother. Pt. Discussed feeling suicidal with the case manager and Dr. Lovena Le.     Group Time: 10:30-12:00  Participation Level:  Active  Behavioral Response: Appropriate  Type of Therapy: Psycho-education Group  Summary of Progress: Pt. Participated in group discussion about assertive communication-asking for what we need in relationship and accepting feedback from others and watched and discussed "nail in head" video.  Eloise Levels, Ph.D., [facility]

## 2015-04-03 NOTE — Progress Notes (Signed)
    Daily Group Progress Note  Program: IOP  Group Time: 9:00-10:30  Participation Level: Active  Behavioral Response: Appropriate  Type of Therapy:  Psycho-education Group  Summary of Progress: Pt. Participated in medication management group with Jiles Garter.     Group Time: 10:30-12:00  Participation Level:  Active  Behavioral Response: Appropriate  Type of Therapy: Psycho-education Group  Summary of Progress: Pt. Participated in vision boarding activity to help facilitate development of goals for the future. Pt. Shared prior experiences in therapy and belief that she had not been completely honest with her providers about what she was experiencing which had impeded her progress. Pt. Processed mood over the weekend and belief that she "destroyed the day" with her attitude. Pt. Discussed how she could have been more emotionally present by telling her parents how much she loves them and misses them instead of communicating her frustration about not being able to spend the time that she wanted to have with them.  Nancie Neas, LPC

## 2015-04-04 ENCOUNTER — Other Ambulatory Visit (HOSPITAL_COMMUNITY): Payer: 59 | Admitting: Psychiatry

## 2015-04-04 ENCOUNTER — Encounter (HOSPITAL_COMMUNITY): Payer: Self-pay | Admitting: Clinical

## 2015-04-04 DIAGNOSIS — F331 Major depressive disorder, recurrent, moderate: Secondary | ICD-10-CM

## 2015-04-04 DIAGNOSIS — F332 Major depressive disorder, recurrent severe without psychotic features: Secondary | ICD-10-CM | POA: Diagnosis not present
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
## 2015-04-05 ENCOUNTER — Other Ambulatory Visit (HOSPITAL_COMMUNITY): Payer: 59 | Admitting: Psychiatry

## 2015-04-05 DIAGNOSIS — F332 Major depressive disorder, recurrent severe without psychotic features: Secondary | ICD-10-CM | POA: Diagnosis not present

## 2015-04-05 DIAGNOSIS — F331 Major depressive disorder, recurrent, moderate: Secondary | ICD-10-CM

## 2015-04-05 NOTE — Progress Notes (Signed)
    Daily Group Progress Note  Program: IOP  Group Time: 9:00-10:30  Participation Level: Active  Behavioral Response: Appropriate  Type of Therapy:  Group Therapy  Summary of Progress: Pt. Presents as quiet, calm and engaged in the group process. Pt. Reports that she is sleeping and eating well and cites the care of her chickens and relationships with her grandchildren as positive in her life.      Group Time: 10:30-12:00  Participation Level:  Active  Behavioral Response: Appropriate  Type of Therapy: Psycho-education Group  Summary of Progress: Pt. Participated in discussion about developing a meditation practice and instruction about RAIN meditation method i.e., recognize, allow, investigate, and non-identification.  Nancie Neas, LPC

## 2015-04-06 ENCOUNTER — Telehealth (HOSPITAL_COMMUNITY): Payer: Self-pay | Admitting: Psychiatry

## 2015-04-06 ENCOUNTER — Other Ambulatory Visit (HOSPITAL_COMMUNITY): Payer: 59 | Admitting: Psychiatry

## 2015-04-06 DIAGNOSIS — F331 Major depressive disorder, recurrent, moderate: Secondary | ICD-10-CM

## 2015-04-06 NOTE — Telephone Encounter (Signed)
D:  Placed call to pt.  Pt has been struggling with SI, no plan/intent the past couple of days.  States that her deterrent is her family.  Mentioned she will be surrounded around them this holiday weekend.  A:  Discussed at length with pt, safety options.  Pt states she is able to contract for safety.  Informed Dr. Lovena Le and Raquel Sarna, RN.  R:  Pt receptive.

## 2015-04-06 NOTE — Progress Notes (Signed)
    Daily Group Progress Note  Program: IOP  Group Time: 9:00-10:30  Participation Level: Active  Behavioral Response: Appropriate  Type of Therapy:  Group Therapy  Summary of Progress: Pt. Reported that she was feeling "alright" continues to be challenged by depression; however, she reported she was able to have a good night spending time with her friends.      Group Time: 10:30-12:00  Participation Level:  Active  Behavioral Response: Appropriate  Type of Therapy: Psycho-education Group  Summary of Progress: Pt. Participated in instruction about breathing exercises and use of grounding series to help manage anxiety and depression.  Nancie Neas, LPC

## 2015-04-06 NOTE — Progress Notes (Signed)
    Daily Group Progress Note  Program: IOP  Group Time: 9:00-10:30  Participation Level: Active  Behavioral Response: Appropriate  Type of Therapy:  Group Therapy  Summary of Progress: Pt. Presents as talkative, depressed, but capable of engaging in group process. Pt. Reports that she continues to have suicidal thoughts, but is able to cite reasons that she will not self-harm including relationships with her husband, daughters, and expecting grandchild in a few days. Pt. 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      Group Time: 10:30-12:00  Participation Level:  Active  Behavioral Response: Appropriate  Type of Therapy: Psycho-education Group  Summary of Progress: Pt. Participated in grief and loss facilitated by Jeanella Craze.  Nancie Neas, LPC

## 2015-04-10 ENCOUNTER — Other Ambulatory Visit (HOSPITAL_COMMUNITY): Payer: 59 | Admitting: *Deleted

## 2015-04-10 DIAGNOSIS — F331 Major depressive disorder, recurrent, moderate: Secondary | ICD-10-CM

## 2015-04-10 DIAGNOSIS — F332 Major depressive disorder, recurrent severe without psychotic features: Secondary | ICD-10-CM | POA: Diagnosis not present

## 2015-04-11 ENCOUNTER — Other Ambulatory Visit (HOSPITAL_COMMUNITY): Payer: 59 | Admitting: *Deleted

## 2015-04-11 DIAGNOSIS — F332 Major depressive disorder, recurrent severe without psychotic features: Secondary | ICD-10-CM | POA: Diagnosis not present

## 2015-04-11 DIAGNOSIS — F331 Major depressive disorder, recurrent, moderate: Secondary | ICD-10-CM

## 2015-04-12 ENCOUNTER — Other Ambulatory Visit (HOSPITAL_COMMUNITY): Payer: 59

## 2015-04-12 DIAGNOSIS — F332 Major depressive disorder, recurrent severe without psychotic features: Secondary | ICD-10-CM | POA: Diagnosis not present

## 2015-04-13 ENCOUNTER — Other Ambulatory Visit (HOSPITAL_COMMUNITY): Payer: 59 | Admitting: Psychiatry

## 2015-04-13 DIAGNOSIS — F331 Major depressive disorder, recurrent, moderate: Secondary | ICD-10-CM

## 2015-04-13 DIAGNOSIS — F332 Major depressive disorder, recurrent severe without psychotic features: Secondary | ICD-10-CM | POA: Diagnosis not present

## 2015-04-13 NOTE — Patient Instructions (Signed)
Patient completed MH-IOP today.  Will follow up with Dr. Adele Schilder on 04-23-15 @ 4 pm.  Pt will call for an appointment with Audelia Acton, LCSW once insurance issue is settled.  Encouraged support groups; especially [facility].
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
## 2015-04-17 ENCOUNTER — Other Ambulatory Visit (HOSPITAL_COMMUNITY): Payer: 59

## 2015-04-17 NOTE — Progress Notes (Signed)
    Daily Group Progress Note  Program: IOP  Daily Group Progress Note Program: IOP  04/11/2015   Group Time: 9 AM - 10:30 AM Participation Level: Active minimal or none Behavioral Response: Appropriate, Motivated and Sharing Type of Therapy:  Process Group Summary of Progress: Patients processed their individual experiences with home practice HALT Exercise. Patient shared that she felt it was helpful as it prompted her to eat at one point and do some art therapy at another which resulted in her experiencing less rumination and more self-care. Patient shared that she liked the 'fact finding' nature of the exercise and had never associated something so brief with self-care and expressed intention to use in the future. Patient reports she sees need for increased fulfillment in her life.  Group Time: 10:45 AM - 12 PM Participation Level:  Active, Minimal or none Behavioral Response: Appropriate, Sharing Type of Therapy: Psycho-education Group Summary of Progress: Facilitator presented 'Wheel of Life' concept with short discussion of eight aspects intrinsic in most lives: Physical environment, Career, Family and Friends, Significant other, Fun and recreation, Health, Chiropodist and personal growth. Patients then ranked their current satisfaction and future goals of satisfaction to see where they may wish to focus some energy.  Patient ranked her current satisfaction as lees than her desired with majority of levels equally balanced while she sees need for recreation as something that could likely add to her sense of well-being. [place], LCSW

## 2015-04-17 NOTE — Progress Notes (Signed)
    Daily Group Progress Note  Program: IOP  Daily Group Progress Note Program: IOP  04/10/2015   Group Time: 9 AM - 10:30 AM Participation Level: Active  Behavioral Response: Appropriate, Sharing, and Motivated Type of Therapy:  Process Group Summary of Progress: Patients processed their holiday experiences during this portion of group and offered feedback to one another. Patient shared how she felt she was able to contribute at times and how she felt she also had some symptoms of decompensation. Patient was receptive to others feedback and also offered support to others. Patient acknowledged tendency judge herself for patterns of behavior such as being quiet and attentive to others. Correlation to her group behavior was made as she engages easily in discussion once her input is requested yet otherwise acknowledges tendency to remain quiet.  Others offered support as they see that as hel0pful verses someone who tries to be center of attention.   Group Time: 10:45 AM - 12 PM Participation Level:  Active Behavioral Response: Appropriate and Sharing Type of Therapy: Psycho-education Group Summary of Progress: Facilitator summarized emotions from individual holiday experiences and presented a home practice exercise which group members agreed to complete at least once before group the following day.  Patient expressed interest as evidenced by her note taking even before she knew it was to be a home practice.   [place], LCSW

## 2015-04-18 ENCOUNTER — Other Ambulatory Visit (HOSPITAL_COMMUNITY): Payer: 59

## 2015-04-18 NOTE — Progress Notes (Signed)
Patient ID: Brittany [name], female   DOB: [birth year], 50 y.o.   MRN: [id number] Discharge Note  Patient:  Brittany [name] is an 50 y.o., female DOB:  [birth year]  Date of Admission:  (Not on file)  Date of Discharge: 04/13/2015  Reason for Admission:depression and suicidal thinking  IOP Course:  Brittany [name] attended and participated in groups,  Her insurance changed at the end of the year and she was not sure which insurance her company would choose so she could not anticipate whether they would pay for IOP.  She did not want to incur the expense if it could be avoided.  She was no better than when she started the program  But did benefit from the security of being in group daily.  She has been approved for [place] therapy and would like to try that rather than returning to ECT but again the change in insurance is the issue.  Mental Status at [place] severely depressed with obsessive suicidal thoughts.  No plan or intent  Lab Results: No results found for this or any previous visit (from the past 48 hour(s)).   Current outpatient prescriptions:  .  calcium-vitamin D (OSCAL WITH D) 500-200 MG-UNIT per tablet, Take 3 tablets by mouth every morning., Disp: , Rfl:  .  cholecalciferol (VITAMIN D) 1000 UNITS tablet, Take 1 tablet (1,000 Units total) by mouth daily., Disp: , Rfl:  .  DULoxetine (CYMBALTA) 60 MG capsule, Take 1 capsule (60 mg total) by mouth 2 (two) times daily., Disp: 60 capsule, Rfl: 1 .  loratadine (CLARITIN) 10 MG tablet, Take 1 tablet (10 mg total) by mouth daily., Disp: , Rfl:  .  Multiple Vitamins-Minerals (MULTIVITAMIN WITH MINERALS) tablet, Take 1 tablet by mouth daily., Disp: , Rfl:  .  QUEtiapine (SEROQUEL) 100 MG tablet, Take 1 tablet (100 mg total) by mouth at bedtime., Disp: 30 tablet, Rfl: 1 .  vitamin B-12 (CYANOCOBALAMIN) 500 MCG tablet, Take 500 mcg by mouth daily., Disp: , Rfl:   Axis Diagnosis:  Severe major depression, recurrent without psychosis   Level of Care:   IOP  Discharge destination:  Other:  has appointment with her psychiatrist but does not want to start with a new therapist until she has the insurance decided  Is patient on multiple antipsychotic therapies at discharge:  No    Has Patient had three or more failed trials of antipsychotic monotherapy by history:  Negative  Patient phone:  [phone] (home)  Patient address:   [place] [address],   Follow-up recommendations:  Activity:  continue current activity Diet:  continue current diet  Comments:  Knows to call for help if she cannot keep the suicidal thoughts at [place].  The patient received suicide prevention pamphlet:  Yes Belongings returned:  na  Donnelly Angelica 04/18/2015, 9:33 AM

## 2015-04-19 ENCOUNTER — Other Ambulatory Visit (HOSPITAL_COMMUNITY): Payer: 59

## 2015-04-20 ENCOUNTER — Other Ambulatory Visit (HOSPITAL_COMMUNITY): Payer: 59

## 2015-04-23 ENCOUNTER — Other Ambulatory Visit (HOSPITAL_COMMUNITY): Payer: 59

## 2015-04-23 ENCOUNTER — Ambulatory Visit (HOSPITAL_COMMUNITY): Payer: Self-pay | Admitting: Psychiatry

## 2015-04-24 ENCOUNTER — Other Ambulatory Visit (HOSPITAL_COMMUNITY): Payer: 59

## 2015-04-25 ENCOUNTER — Other Ambulatory Visit (HOSPITAL_COMMUNITY): Payer: 59

## 2015-04-26 ENCOUNTER — Other Ambulatory Visit (HOSPITAL_COMMUNITY): Payer: 59

## 2015-04-27 ENCOUNTER — Other Ambulatory Visit (HOSPITAL_COMMUNITY): Payer: 59

## 2015-04-30 ENCOUNTER — Other Ambulatory Visit (HOSPITAL_COMMUNITY): Payer: 59

## 2015-05-01 ENCOUNTER — Other Ambulatory Visit (HOSPITAL_COMMUNITY): Payer: 59

## 2015-05-02 ENCOUNTER — Other Ambulatory Visit (HOSPITAL_COMMUNITY): Payer: 59

## 2015-05-03 ENCOUNTER — Other Ambulatory Visit (HOSPITAL_COMMUNITY): Payer: 59

## 2015-05-03 ENCOUNTER — Ambulatory Visit (HOSPITAL_COMMUNITY): Payer: Self-pay | Admitting: Clinical

## 2015-05-04 ENCOUNTER — Other Ambulatory Visit (HOSPITAL_COMMUNITY): Payer: 59

## 2015-05-07 ENCOUNTER — Other Ambulatory Visit (HOSPITAL_COMMUNITY): Payer: 59

## 2015-05-08 ENCOUNTER — Other Ambulatory Visit (HOSPITAL_COMMUNITY): Payer: 59

## 2015-05-08 ENCOUNTER — Ambulatory Visit (INDEPENDENT_AMBULATORY_CARE_PROVIDER_SITE_OTHER): Payer: 59 | Admitting: Psychiatry

## 2015-05-08 ENCOUNTER — Encounter (HOSPITAL_COMMUNITY): Payer: Self-pay | Admitting: Psychiatry

## 2015-05-08 VITALS — BP 144/84 | HR 80 | Resp 12 | Ht 70.0 in | Wt 224.2 lb

## 2015-05-08 DIAGNOSIS — F331 Major depressive disorder, recurrent, moderate: Secondary | ICD-10-CM

## 2015-05-08 DIAGNOSIS — F4310 Post-traumatic stress disorder, unspecified: Secondary | ICD-10-CM | POA: Diagnosis not present

## 2015-05-08 MED ORDER — QUETIAPINE FUMARATE 100 MG PO TABS: 100.0000 mg | ORAL_TABLET | Freq: Every day | ORAL | Status: DC

## 2015-05-08 MED ORDER — DULOXETINE HCL 60 MG PO CPEP: 60.0000 mg | ORAL_CAPSULE | Freq: Two times a day (BID) | ORAL | Status: DC
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
## 2015-05-09 ENCOUNTER — Other Ambulatory Visit (HOSPITAL_COMMUNITY): Payer: 59

## 2015-05-10 ENCOUNTER — Other Ambulatory Visit (HOSPITAL_COMMUNITY): Payer: 59

## 2015-05-11 ENCOUNTER — Other Ambulatory Visit (HOSPITAL_COMMUNITY): Payer: 59

## 2015-05-14 ENCOUNTER — Other Ambulatory Visit (HOSPITAL_COMMUNITY): Payer: 59

## 2015-05-15 ENCOUNTER — Other Ambulatory Visit (HOSPITAL_COMMUNITY): Payer: 59

## 2015-05-16 ENCOUNTER — Other Ambulatory Visit (HOSPITAL_COMMUNITY): Payer: 59

## 2015-05-17 ENCOUNTER — Ambulatory Visit (HOSPITAL_COMMUNITY): Payer: Self-pay | Admitting: Clinical

## 2015-05-17 ENCOUNTER — Other Ambulatory Visit (HOSPITAL_COMMUNITY): Payer: 59

## 2015-05-18 ENCOUNTER — Other Ambulatory Visit (HOSPITAL_COMMUNITY): Payer: 59

## 2015-05-21 ENCOUNTER — Other Ambulatory Visit (HOSPITAL_COMMUNITY): Payer: 59

## 2015-05-30 ENCOUNTER — Telehealth (HOSPITAL_COMMUNITY): Payer: Self-pay

## 2015-05-30 NOTE — Telephone Encounter (Signed)
Telephone call from patient stating she was interested in now trying to get started with [facility].  States she has a new job now and new insurance through [place], [place] # [phone] that all changed since her last discussion with Dionne Bucy, [employer] coordinator and would like to work on getting authorized to start [place].  Agreed to send message to [place] coordinator to contact patient back to assist her with starting process with new insurance company to request coverage and to get patient started in [place] as soon as possible.  Agreed to send message to [employer] coordinator and requested patient call this nurse back if Dionne Bucy had not contacted patient back by 05/31/15 evening and patient agreed with plan.

## 2015-06-01 NOTE — Telephone Encounter (Signed)
D:  Writer was calling pt to complete the 30-day survey for MH-IOP.  Pt was at work; stating that she has been struggling since the last appointment with Dr. Adele Schilder.  Reports she hasn't followed up with [name] nor [place] due to financial strain.  "Dr. Adele Schilder told me that I needed to do [place] or I'll stay in the shape I'm in.  I did call Shawn, RN the other day and left a message to say I am ready for [place], but I haven't heard from anyone."  Pt states she has been going through the motion of working; but has isolated herself and her coworkers aren't talking to her.  Pt became tearful over the phone.  Writer inquired if pt would consider MH-IOP, but strongly recommended inpatient due to her SI.  Pt declined MH-IOP; but states she and her husband have been discussing her going inpatient.  "I have so many hospital bills, but my husband told me not to worry about them."  Reiterated to pt that her safety and wellness are the most important and to not worry about the bills.  Informed pt that she could be put on a payment plan.  Pt scored 42 on the [place] Depression Checklist.  Admits to daily SI with plan (OD), no intent.  States her deterrents are her husband and religion.  "I can't imagine my husband finding me dead."  Pt stated that her next appt with Dr. Adele Schilder is in March.  A:  Strongly recommended that pt come into the hospital; but she declined.  Discussed safety options at length with pt.  Pt was able to verbally contract for safety.  States her husband will be with her this weekend.  Moved patient's appointment to a sooner appt to see Dr. Adele Schilder ( Tuesday 06-05-15 @ 3 pm).  Will inform Dr. Adele Schilder and Beather Arbour, RN of phone call.  Will follow up 06-04-15 with pt.  R:  Pt receptive.

## 2015-06-04 NOTE — Telephone Encounter (Signed)
D:  Placed call to check on patient.  According to pt, she stayed busy over the weekend.  "My husband was here and he was very supportive."  Pt states she was off from work today due to holiday.  Stated that she hasn't heard back about [place] as of yet.  A:  Reminded pt about her appt with Dr. Adele Schilder tomorrow at 3 pm.  Will f/u with Edwena Blow and Raquel Sarna, RN about [employer].  R:  Pt receptive.

## 2015-06-05 ENCOUNTER — Ambulatory Visit (INDEPENDENT_AMBULATORY_CARE_PROVIDER_SITE_OTHER): Payer: 59 | Admitting: Psychiatry

## 2015-06-05 ENCOUNTER — Encounter (HOSPITAL_COMMUNITY): Payer: Self-pay | Admitting: Psychiatry

## 2015-06-05 VITALS — BP 135/83 | HR 90 | Ht 70.0 in | Wt 219.8 lb

## 2015-06-05 DIAGNOSIS — F331 Major depressive disorder, recurrent, moderate: Secondary | ICD-10-CM | POA: Diagnosis not present

## 2015-06-05 DIAGNOSIS — F4310 Post-traumatic stress disorder, unspecified: Secondary | ICD-10-CM

## 2015-06-05 MED ORDER — QUETIAPINE FUMARATE 100 MG PO TABS: 100.0000 mg | ORAL_TABLET | Freq: Every day | ORAL | Status: DC

## 2015-06-05 MED ORDER — DULOXETINE HCL 60 MG PO CPEP: 60.0000 mg | ORAL_CAPSULE | Freq: Two times a day (BID) | ORAL | Status: DC
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
## 2015-06-06 NOTE — Progress Notes (Signed)
We are going to get her in for treatment as soon as we can.

## 2015-06-07 ENCOUNTER — Telehealth: Payer: Self-pay | Admitting: Psychiatry

## 2015-06-07 NOTE — Telephone Encounter (Signed)
I had received information from the patient's outpatient psychiatrist that she was interested in resuming ECT possibly as a brief index or possibly as a maintenance course. I spoke to the patient on the phone this morning and confirm that that was true. She has spoken with our nurses in ECT and there is a plan afoot to put her on the schedule for Monday. I will confirm this with our nursing staff but it sounds like that will work out since her last lab studies were done in July 2016. Hopefully we won't need to do any other studies before that.

## 2015-06-11 ENCOUNTER — Encounter: Payer: Self-pay | Admitting: Anesthesiology

## 2015-06-11 ENCOUNTER — Encounter
Admission: RE | Admit: 2015-06-11 | Discharge: 2015-06-11 | Disposition: A | Discharge status: Home / Self Care | Payer: 59 | Source: Ambulatory Visit | Attending: Psychiatry | Admitting: Psychiatry

## 2015-06-11 DIAGNOSIS — Z9889 Other specified postprocedural states: Secondary | ICD-10-CM | POA: Diagnosis not present

## 2015-06-11 DIAGNOSIS — F332 Major depressive disorder, recurrent severe without psychotic features: Secondary | ICD-10-CM | POA: Diagnosis not present

## 2015-06-11 DIAGNOSIS — F4310 Post-traumatic stress disorder, unspecified: Secondary | ICD-10-CM | POA: Diagnosis not present

## 2015-06-11 DIAGNOSIS — Z888 Allergy status to other drugs, medicaments and biological substances status: Secondary | ICD-10-CM | POA: Diagnosis not present

## 2015-06-11 DIAGNOSIS — M1990 Unspecified osteoarthritis, unspecified site: Secondary | ICD-10-CM | POA: Diagnosis not present

## 2015-06-11 DIAGNOSIS — I10 Essential (primary) hypertension: Secondary | ICD-10-CM | POA: Insufficient documentation

## 2015-06-11 DIAGNOSIS — Z881 Allergy status to other antibiotic agents status: Secondary | ICD-10-CM | POA: Diagnosis not present

## 2015-06-11 DIAGNOSIS — Z87891 Personal history of nicotine dependence: Secondary | ICD-10-CM | POA: Diagnosis not present

## 2015-06-11 DIAGNOSIS — Z880 Allergy status to penicillin: Secondary | ICD-10-CM | POA: Insufficient documentation

## 2015-06-11 DIAGNOSIS — K219 Gastro-esophageal reflux disease without esophagitis: Secondary | ICD-10-CM | POA: Insufficient documentation

## 2015-06-11 DIAGNOSIS — E7800 Pure hypercholesterolemia, unspecified: Secondary | ICD-10-CM | POA: Diagnosis not present

## 2015-06-11 DIAGNOSIS — G4730 Sleep apnea, unspecified: Secondary | ICD-10-CM | POA: Diagnosis not present

## 2015-06-11 MED ORDER — SODIUM CHLORIDE 0.9 % IV SOLN
250.0000 mL | Freq: Once | INTRAVENOUS | Status: AC
  Administered 2015-06-11: 500 mL via INTRAVENOUS

## 2015-06-11 MED ORDER — METHOHEXITAL SODIUM 100 MG/10ML IV SOSY
PREFILLED_SYRINGE | INTRAVENOUS | Status: DC | PRN
  Administered 2015-06-11: 90 mg via INTRAVENOUS

## 2015-06-11 MED ORDER — SUCCINYLCHOLINE CHLORIDE 20 MG/ML IJ SOLN
INTRAMUSCULAR | Status: DC | PRN
  Administered 2015-06-11: 150 mg via INTRAVENOUS

## 2015-06-11 NOTE — Anesthesia Preprocedure Evaluation (Signed)
Anesthesia Evaluation  Patient identified by MRN, date of birth, ID band Patient awake    Reviewed: Allergy & Precautions, NPO status , Patient's Chart, lab work & pertinent test results  Airway Mallampati: I  TM Distance: >3 FB Neck ROM: Full    Dental  (+) Teeth Intact   Pulmonary sleep apnea , former smoker,  OSA, no CPAP, and may  Have resolved with weight loss post gastric bypass.   Pulmonary exam normal        Cardiovascular hypertension, Normal cardiovascular exam     Neuro/Psych PSYCHIATRIC DISORDERS (Depression and PTSD)    GI/Hepatic hiatal hernia, GERD  Controlled,  Endo/Other    Renal/GU      Musculoskeletal   Abdominal (+) + obese,  Abdomen: soft.    Peds  Hematology   Anesthesia Other Findings   Reproductive/Obstetrics                             Anesthesia Physical  Anesthesia Plan  ASA: III  Anesthesia Plan: General   Post-op Pain Management:    Induction: Intravenous  Airway Management Planned: Mask  Additional Equipment:   Intra-op Plan:   Post-operative Plan:   Informed Consent: I have reviewed the patients History and Physical, chart, labs and discussed the procedure including the risks, benefits and alternatives for the proposed anesthesia with the patient or authorized representative who has indicated his/her understanding and acceptance.     Plan Discussed with: CRNA  Anesthesia Plan Comments:         Anesthesia Quick Evaluation

## 2015-06-11 NOTE — H&P (Signed)
Brittany [name] is an 50 y.o. female.   Chief Complaint: Return of depression with irritability low mood and decreased concentration. Returning for ECT. HPI: Patient with a history of recurrent severe depression. History of good response to ECT.  Past Medical History  Diagnosis Date  . Migraine   . Hypertension   . Hypercholesterolemia   . Depression   . Morbid obesity ([place])   . Sleep apnea     no CPAP machine   . H/O hiatal hernia   . GERD (gastroesophageal reflux disease)     hx of   . Arthritis     hips, knees and hands   . Anxiety   . PTSD (post-traumatic stress disorder)     Past Surgical History  Procedure Laterality Date  . Foot surgery    . Breath tek h pylori  05/03/2012    Procedure: BREATH TEK H PYLORI;  Surgeon: [name], MD;  Location: [name];  Service: General;  Laterality: N/A;  . Right tube and ovary removed     . Abdominal hysterectomy  1997 & 2006  . Colonoscopy      hx of benign polyps   . 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 Upper gi endoscopy N/A 07/05/2012    Procedure: UPPER GI ENDOSCOPY;  Surgeon: [name], MD;  Location: [facility];  Service: General;  Laterality: N/A;  . Laparoscopic lysis of adhesions N/A 07/05/2012    Procedure: LAPAROSCOPIC LYSIS OF ADHESIONS;  Surgeon: [name], MD;  Location: [facility];  Service: General;  Laterality: N/A;  repair of abdominal wall hernia  . Irrigation and debridement abscess N/A 01/01/2014    Procedure: IRRIGATION AND DEBRIDEMENT ABSCESS;  Surgeon: [name], MD;  Location: [facility];  Service: General;  Laterality: N/A;  . Gastric bypass      Family History  Problem Relation Age of Onset  . Cancer Mother     colon  . Cancer Maternal Aunt     breast  . Depression Father   . Depression Sister   . Post-traumatic stress disorder Sister   . Alcohol abuse Paternal  Uncle   . Bipolar disorder Cousin   . Anxiety disorder Sister    Social History:  reports that she quit smoking about 11 years ago. Her smoking use included Cigars. She has never used smokeless tobacco. She reports that she does not drink alcohol or use illicit drugs.  Allergies:  Allergies  Allergen Reactions  . Amoxicillin Hives  . Bactrim [Sulfamethoxazole-Trimethoprim] Hives and Rash  . Lamictal [Lamotrigine] Hives and Rash     (Not in a hospital admission)  No results found for this or any previous visit (from the past 48 hour(s)). No results found.  Review of Systems  Constitutional: Negative.   HENT: Negative.   Eyes: Negative.   Respiratory: Negative.   Cardiovascular: Negative.   Gastrointestinal: Negative.   Musculoskeletal: Negative.   Skin: Negative.   Neurological: Negative.   Psychiatric/Behavioral: Positive for depression. Negative for suicidal ideas, hallucinations, memory loss and substance abuse. The patient is nervous/anxious. The patient does not have insomnia.     Blood pressure 132/86, pulse 59, temperature 96.8 F (36 C), temperature source Oral, resp. rate 16, height 5\' 10"  (1.778 m), weight 101.152 kg (223 lb), SpO2 98 %. Physical Exam  Nursing note and vitals reviewed. Constitutional: She  appears well-developed and well-nourished.  HENT:  Head: Normocephalic and atraumatic.  Eyes: Conjunctivae are normal. Pupils are equal, round, and reactive to light.  Neck: Normal range of motion.  Cardiovascular: Normal rate, regular rhythm and normal heart sounds.   Respiratory: Effort normal and breath sounds normal.  GI: Soft.  Musculoskeletal: Normal range of motion.  Neurological: She is alert.  Skin: Skin is warm and dry.  Psychiatric: Her speech is normal. Judgment and thought content normal. She is slowed. Cognition and memory are normal. She exhibits a depressed mood.     Assessment/Plan Treatment today tentatively scheduled [facility] and [facility]  [name], MD 06/11/2015, 10:46 AM

## 2015-06-11 NOTE — Transfer of Care (Signed)
Immediate Anesthesia Transfer of Care Note  Patient: Brittany [name]  Procedure(s) Performed: * No procedures listed *  Patient Location: PACU  Anesthesia Type:General  Level of Consciousness: sedated  Airway & Oxygen Therapy: Patient Spontanous Breathing and Patient connected to face mask oxygen  Post-op Assessment: Report given to RN and Post -op Vital signs reviewed and stable  Post vital signs: Reviewed and stable  Last Vitals:  Filed Vitals:   06/11/15 0853 06/11/15 1104  BP: 132/86 124/76  Pulse: 59 71  Temp: 36 C 36.6 C  Resp: 16 9    Complications: No apparent anesthesia complications

## 2015-06-11 NOTE — Anesthesia Procedure Notes (Signed)
Date/Time: 06/11/2015 10:45 AM Performed by: Allean Found Pre-anesthesia Checklist: Patient identified, Emergency Drugs available, Suction available, Patient being monitored and Timeout performed Patient Re-evaluated:Patient Re-evaluated prior to inductionPreoxygenation: Pre-oxygenation with 100% oxygen Intubation Type: IV induction Airway Equipment and Method: Bite block Dental Injury: Teeth and Oropharynx as per pre-operative assessment

## 2015-06-11 NOTE — Procedures (Signed)
ECT SERVICES Physician's Interval Evaluation & Treatment Note  Patient Identification: LOGHAN MARICONDA MRN:  [id number] Date of Evaluation:  06/11/2015 TX #: 11  MADRS: 20  MMSE: 30  P.E. Findings:  No change to physical exam from prior examination lungs and heart clear vitals normal  Psychiatric Interval Note:  Mood depressedsuicidal thoughts no psychosis  Subjective:  Patient is a 50 y.o. female seen for evaluation for Electroconvulsive Therapy. Feeling more depressed and anxious and irritable  Treatment Summary:   [x]   Right Unilateral             []  Bilateral   % Energy : 0.3 ms 70%   Impedance: 770 ohms  Seizure Energy Index: 22,248 V squared  Postictal Suppression Index: 97%  Seizure Concordance Index: 98%  Medications  Pre Shock: Brevital 90 mg, succinylcholine 150 mg  Post Shock:    Seizure Duration: 35 seconds by EMG, 65 seconds by EEG   Comments: Follow-up Wednesday and most likely Friday   Lungs:  [x]   Clear to auscultation               []  Other:   Heart:    [x]   Regular rhythm             []  irregular rhythm    [x]   Previous H&P reviewed, patient examined and there are NO CHANGES                 []   Previous H&P reviewed, patient examined and there are changes noted.   [name], MD 2/27/201710:49 AM

## 2015-06-13 ENCOUNTER — Encounter
Admission: RE | Admit: 2015-06-13 | Discharge: 2015-06-13 | Disposition: A | Discharge status: Home / Self Care | Payer: 59 | Source: Ambulatory Visit | Attending: Psychiatry | Admitting: Psychiatry

## 2015-06-13 ENCOUNTER — Encounter: Payer: Self-pay | Admitting: Anesthesiology

## 2015-06-13 DIAGNOSIS — F4310 Post-traumatic stress disorder, unspecified: Secondary | ICD-10-CM | POA: Diagnosis not present

## 2015-06-13 DIAGNOSIS — Z87891 Personal history of nicotine dependence: Secondary | ICD-10-CM | POA: Insufficient documentation

## 2015-06-13 DIAGNOSIS — I10 Essential (primary) hypertension: Secondary | ICD-10-CM | POA: Insufficient documentation

## 2015-06-13 DIAGNOSIS — G4730 Sleep apnea, unspecified: Secondary | ICD-10-CM | POA: Diagnosis not present

## 2015-06-13 DIAGNOSIS — E7800 Pure hypercholesterolemia, unspecified: Secondary | ICD-10-CM | POA: Insufficient documentation

## 2015-06-13 DIAGNOSIS — K219 Gastro-esophageal reflux disease without esophagitis: Secondary | ICD-10-CM | POA: Diagnosis not present

## 2015-06-13 DIAGNOSIS — M1990 Unspecified osteoarthritis, unspecified site: Secondary | ICD-10-CM | POA: Diagnosis not present

## 2015-06-13 DIAGNOSIS — F332 Major depressive disorder, recurrent severe without psychotic features: Secondary | ICD-10-CM | POA: Insufficient documentation

## 2015-06-13 DIAGNOSIS — Z880 Allergy status to penicillin: Secondary | ICD-10-CM | POA: Diagnosis not present

## 2015-06-13 MED ORDER — FENTANYL CITRATE (PF) 100 MCG/2ML IJ SOLN: 25.0000 ug | INTRAMUSCULAR | Status: DC | PRN

## 2015-06-13 MED ORDER — SODIUM CHLORIDE 0.9 % IV SOLN
250.0000 mL | Freq: Once | INTRAVENOUS | Status: AC
  Administered 2015-06-13: 500 mL via INTRAVENOUS

## 2015-06-13 MED ORDER — SODIUM CHLORIDE 0.9 % IV SOLN
INTRAVENOUS | Status: DC | PRN
  Administered 2015-06-13: 11:00:00 via INTRAVENOUS

## 2015-06-13 MED ORDER — SUCCINYLCHOLINE 20MG/ML (10ML) SYRINGE FOR MEDFUSION PUMP - OPTIME
INTRAMUSCULAR | Status: DC | PRN
  Administered 2015-06-13: 150 mg via INTRAVENOUS

## 2015-06-13 MED ORDER — ONDANSETRON HCL 4 MG/2ML IJ SOLN: 4.0000 mg | Freq: Once | INTRAMUSCULAR | Status: DC | PRN

## 2015-06-13 MED ORDER — METHOHEXITAL SODIUM 100 MG/10ML IV SOSY
PREFILLED_SYRINGE | INTRAVENOUS | Status: DC | PRN
  Administered 2015-06-13: 90 mg via INTRAVENOUS

## 2015-06-13 NOTE — Procedures (Signed)
ECT SERVICES Physician's Interval Evaluation & Treatment Note  Patient Identification: Brittany [name] MRN:  [id number] Date of Evaluation:  06/13/2015 TX #: 12  MADRS:   MMSE:   P.E. Findings:  No change to physical exam vitals stable heart and lungs normal  Psychiatric Interval Note:  Mood still a little bit down but no worse than before no side effects  Subjective:  Patient is a 50 y.o. female seen for evaluation for Electroconvulsive Therapy. Depressed no other specific complaints  Treatment Summary:   [x]   Right Unilateral             []  Bilateral   % Energy : 0.3 ms 90%   Impedance: 900 ohms  Seizure Energy Index: 19,480 V squared  Postictal Suppression Index: 89%  Seizure Concordance Index: 98%  Medications  Pre Shock: Brevital 90 mg, succinylcholine 150 mg  Post Shock:    Seizure Duration: 40 seconds by EMG 74 seconds by EEG   Comments: Follow-up Friday with continued reevaluation of improvement   Lungs:  [x]   Clear to auscultation               []  Other:   Heart:    [x]   Regular rhythm             []  irregular rhythm    [x]   Previous H&P reviewed, patient examined and there are NO CHANGES                 []   Previous H&P reviewed, patient examined and there are changes noted.   [name], MD 3/1/201710:35 AM

## 2015-06-13 NOTE — Anesthesia Preprocedure Evaluation (Signed)
Anesthesia Evaluation  Patient identified by MRN, date of birth, ID band Patient awake    Reviewed: Allergy & Precautions, NPO status , Patient's Chart, lab work & pertinent test results  Airway Mallampati: II  TM Distance: >3 FB Neck ROM: Full    Dental  (+) Teeth Intact   Pulmonary sleep apnea , former smoker,  OSA, no CPAP, and may  Have resolved with weight loss post gastric bypass.   Pulmonary exam normal        Cardiovascular hypertension, Pt. on medications Normal cardiovascular exam     Neuro/Psych  Headaches, PSYCHIATRIC DISORDERS (Depression and PTSD) Anxiety Depression    GI/Hepatic Neg liver ROS, hiatal hernia, GERD  Controlled,  Endo/Other  negative endocrine ROS  Renal/GU negative Renal ROS     Musculoskeletal  (+) Arthritis , Osteoarthritis,    Abdominal (+) + obese,  Abdomen: soft.    Peds negative pediatric ROS (+)  Hematology  (+) anemia ,   Anesthesia Other Findings   Reproductive/Obstetrics                             Anesthesia Physical Anesthesia Plan  ASA: III  Anesthesia Plan: General   Post-op Pain Management:    Induction: Intravenous  Airway Management Planned: Mask  Additional Equipment:   Intra-op Plan:   Post-operative Plan:   Informed Consent: I have reviewed the patients History and Physical, chart, labs and discussed the procedure including the risks, benefits and alternatives for the proposed anesthesia with the patient or authorized representative who has indicated his/her understanding and acceptance.   Dental advisory given  Plan Discussed with: CRNA and Surgeon  Anesthesia Plan Comments:         Anesthesia Quick Evaluation                                  Anesthesia Evaluation  Patient identified by MRN, date of birth, ID band Patient awake    Reviewed: Allergy & Precautions, NPO status   Airway Mallampati: II  TM  Distance: >3 FB Neck ROM: Full    Dental  (+) Caps   Pulmonary sleep apnea , former smoker,    Pulmonary exam normal       Cardiovascular hypertension, Normal cardiovascular exam    Neuro/Psych  Headaches, Anxiety Depression    GI/Hepatic Neg liver ROS, hiatal hernia, GERD-  Medicated and Controlled,  Endo/Other  negative endocrine ROS  Renal/GU negative Renal ROS     Musculoskeletal  (+) Arthritis -, Osteoarthritis,    Abdominal Normal abdominal exam  (+)   Peds negative pediatric ROS (+)  Hematology negative hematology ROS (+) anemia ,   Anesthesia Other Findings Hysterectomy... Hx of morbid obesity in the past  Reproductive/Obstetrics                             Anesthesia Physical Anesthesia Plan  ASA: II  Anesthesia Plan: General   Post-op Pain Management:    Induction: Intravenous  Airway Management Planned: Simple Face Mask  Additional Equipment:   Intra-op Plan:   Post-operative Plan:   Informed Consent: I have reviewed the patients History and Physical, chart, labs and discussed the procedure including the risks, benefits and alternatives for the proposed anesthesia with the patient or authorized representative who has indicated his/her understanding and acceptance.  Dental advisory given  Plan Discussed with: CRNA and Surgeon  Anesthesia Plan Comments:         Anesthesia Quick Evaluation

## 2015-06-13 NOTE — Anesthesia Postprocedure Evaluation (Signed)
Anesthesia Post Note  Patient: Brittany [name]  Procedure(s) Performed: * No procedures listed *  Patient location during evaluation: PACU Anesthesia Type: General Level of consciousness: awake and alert and oriented Pain management: pain level controlled Vital Signs Assessment: post-procedure vital signs reviewed and stable Respiratory status: spontaneous breathing Cardiovascular status: blood pressure returned to baseline Anesthetic complications: no    Last Vitals:  Filed Vitals:   06/13/15 1135 06/13/15 1143  BP: 111/74 114/77  Pulse: 74 68  Temp:    Resp: 14 16    Last Pain:  Filed Vitals:   06/13/15 1144  PainSc: 0-No pain                 [name]

## 2015-06-13 NOTE — Anesthesia Procedure Notes (Signed)
Date/Time: 06/13/2015 10:42 AM Performed by: Dionne Bucy Pre-anesthesia Checklist: Patient identified, Emergency Drugs available, Suction available and Patient being monitored Patient Re-evaluated:Patient Re-evaluated prior to inductionOxygen Delivery Method: Circle system utilized Preoxygenation: Pre-oxygenation with 100% oxygen Intubation Type: IV induction Ventilation: Mask ventilation without difficulty and Mask ventilation throughout procedure Airway Equipment and Method: Bite block Placement Confirmation: positive ETCO2 Dental Injury: Teeth and Oropharynx as per pre-operative assessment

## 2015-06-13 NOTE — Transfer of Care (Signed)
Immediate Anesthesia Transfer of Care Note  Patient: Brittany [name]  Procedure(s) Performed: ECT  Patient Location: PACU  Anesthesia Type:General  Level of Consciousness: awake  Airway & Oxygen Therapy: Patient Spontanous Breathing  Post-op Assessment: Report given to RN  Post vital signs: Reviewed and stable  Last Vitals:  Filed Vitals:   06/13/15 0846 06/13/15 1055  BP: 116/68 120/71  Pulse: 54 84  Temp: 35.9 C 36.7 C  Resp: 16 14    Complications: No apparent anesthesia complications

## 2015-06-13 NOTE — H&P (Signed)
Brittany [name] is an 50 y.o. female.   Chief Complaint: Patient had a bad day emotionally yesterday but is feeling a little better today. Patient is receiving a return of ECT for major depression relapse HPI: Major depression especially with anxiety and irritability  Past Medical History  Diagnosis Date  . Migraine   . Hypertension   . Hypercholesterolemia   . Depression   . Morbid obesity ([place])   . Sleep apnea     no CPAP machine   . H/O hiatal hernia   . GERD (gastroesophageal reflux disease)     hx of   . Arthritis     hips, knees and hands   . Anxiety   . PTSD (post-traumatic stress disorder)     Past Surgical History  Procedure Laterality Date  . Foot surgery    . Breath tek h pylori  05/03/2012    Procedure: BREATH TEK H PYLORI;  Surgeon: [name], MD;  Location: [name];  Service: General;  Laterality: N/A;  . Right tube and ovary removed     . Abdominal hysterectomy  1997 & 2006  . Colonoscopy      hx of benign polyps   . 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 Upper gi endoscopy N/A 07/05/2012    Procedure: UPPER GI ENDOSCOPY;  Surgeon: [name], MD;  Location: [facility];  Service: General;  Laterality: N/A;  . Laparoscopic lysis of adhesions N/A 07/05/2012    Procedure: LAPAROSCOPIC LYSIS OF ADHESIONS;  Surgeon: [name], MD;  Location: [facility];  Service: General;  Laterality: N/A;  repair of abdominal wall hernia  . Irrigation and debridement abscess N/A 01/01/2014    Procedure: IRRIGATION AND DEBRIDEMENT ABSCESS;  Surgeon: [name], MD;  Location: [facility];  Service: General;  Laterality: N/A;  . Gastric bypass      Family History  Problem Relation Age of Onset  . Cancer Mother     colon  . Cancer Maternal Aunt     breast  . Depression Father   . Depression Sister   . Post-traumatic stress disorder Sister   .  Alcohol abuse Paternal Uncle   . Bipolar disorder Cousin   . Anxiety disorder Sister    Social History:  reports that she quit smoking about 11 years ago. Her smoking use included Cigars. She has never used smokeless tobacco. She reports that she does not drink alcohol or use illicit drugs.  Allergies:  Allergies  Allergen Reactions  . Amoxicillin Hives  . Bactrim [Sulfamethoxazole-Trimethoprim] Hives and Rash  . Lamictal [Lamotrigine] Hives and Rash     (Not in a hospital admission)  No results found for this or any previous visit (from the past 48 hour(s)). No results found.  Review of Systems  Constitutional: Negative.   HENT: Negative.   Eyes: Negative.   Respiratory: Negative.   Cardiovascular: Negative.   Gastrointestinal: Negative.   Musculoskeletal: Negative.   Skin: Negative.   Neurological: Negative.   Psychiatric/Behavioral: Positive for depression. Negative for suicidal ideas, hallucinations, memory loss and substance abuse. The patient is nervous/anxious. The patient does not have insomnia.     Blood pressure 116/68, pulse 54, temperature 96.7 F (35.9 C), temperature source Tympanic, resp. rate 16, weight 101.606 kg (224 lb), SpO2 100 %. Physical Exam  Nursing note and vitals reviewed. Constitutional: She  appears well-developed and well-nourished.  HENT:  Head: Normocephalic and atraumatic.  Eyes: Conjunctivae are normal. Pupils are equal, round, and reactive to light.  Neck: Normal range of motion.  Cardiovascular: Normal rate, regular rhythm and normal heart sounds.   Respiratory: Effort normal.  GI: Soft.  Musculoskeletal: Normal range of motion.  Neurological: She is alert.  Skin: Skin is warm and dry.  Psychiatric: Her speech is normal and behavior is normal. Judgment and thought content normal. Cognition and memory are normal. She exhibits a depressed mood.     Assessment/Plan Treatment today and also scheduled for Friday with continued  monitoring of symptom improvement  [name], MD 06/13/2015, 10:34 AM

## 2015-06-14 NOTE — Addendum Note (Signed)
Addendum  created 06/14/15 1253 by Martha Clan, MD   Modules edited: Clinical Notes   Clinical Notes:  File: [id number]

## 2015-06-14 NOTE — Anesthesia Postprocedure Evaluation (Signed)
Anesthesia Post Note  Patient: Brittany [name]  Procedure(s) Performed: * No procedures listed *  Patient location during evaluation: PACU Anesthesia Type: General Level of consciousness: awake and alert Pain management: pain level controlled Vital Signs Assessment: post-procedure vital signs reviewed and stable Respiratory status: spontaneous breathing, nonlabored ventilation, respiratory function stable and patient connected to nasal cannula oxygen Cardiovascular status: blood pressure returned to baseline and stable Postop Assessment: no signs of nausea or vomiting Anesthetic complications: no    Last Vitals:  Filed Vitals:   06/11/15 1135 06/11/15 1141  BP: 120/92 128/80  Pulse: 80 72  Temp:    Resp: 12 18    Last Pain:  Filed Vitals:   06/11/15 1143  PainSc: 0-No pain                 Martha Clan

## 2015-06-15 ENCOUNTER — Encounter (HOSPITAL_BASED_OUTPATIENT_CLINIC_OR_DEPARTMENT_OTHER)
Admission: RE | Admit: 2015-06-15 | Discharge: 2015-06-15 | Disposition: A | Discharge status: Home / Self Care | Payer: 59 | Source: Ambulatory Visit | Attending: Psychiatry | Admitting: Psychiatry

## 2015-06-15 ENCOUNTER — Encounter: Payer: Self-pay | Admitting: Anesthesiology

## 2015-06-15 DIAGNOSIS — F332 Major depressive disorder, recurrent severe without psychotic features: Secondary | ICD-10-CM

## 2015-06-15 MED ORDER — METHOHEXITAL SODIUM 100 MG/10ML IV SOSY
PREFILLED_SYRINGE | INTRAVENOUS | Status: DC | PRN
  Administered 2015-06-15: 90 mg via INTRAVENOUS

## 2015-06-15 MED ORDER — SUCCINYLCHOLINE 20MG/ML (10ML) SYRINGE FOR MEDFUSION PUMP - OPTIME
INTRAMUSCULAR | Status: DC | PRN
  Administered 2015-06-15: 150 mg via INTRAVENOUS

## 2015-06-15 MED ORDER — SODIUM CHLORIDE 0.9 % IV SOLN
INTRAVENOUS | Status: DC | PRN
  Administered 2015-06-15: 11:00:00 via INTRAVENOUS

## 2015-06-15 NOTE — H&P (Signed)
Brittany [name] is an 50 y.o. female.   Chief Complaint: Patient feels improved but not yet well. Still some low depressed mood. No suicidal thoughts. Physically feeling well minimal side effects no new complaints HPI: Physically feeling well. No complaints  Past Medical History  Diagnosis Date  . Migraine   . Hypertension   . Hypercholesterolemia   . Depression   . Morbid obesity ([place])   . Sleep apnea     no CPAP machine   . H/O hiatal hernia   . GERD (gastroesophageal reflux disease)     hx of   . Arthritis     hips, knees and hands   . Anxiety   . PTSD (post-traumatic stress disorder)     Past Surgical History  Procedure Laterality Date  . Foot surgery    . Breath tek h pylori  05/03/2012    Procedure: BREATH TEK H PYLORI;  Surgeon: [name], MD;  Location: [name];  Service: General;  Laterality: N/A;  . Right tube and ovary removed     . Abdominal hysterectomy  1997 & 2006  . Colonoscopy      hx of benign polyps   . 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 Upper gi endoscopy N/A 07/05/2012    Procedure: UPPER GI ENDOSCOPY;  Surgeon: [name], MD;  Location: [facility];  Service: General;  Laterality: N/A;  . Laparoscopic lysis of adhesions N/A 07/05/2012    Procedure: LAPAROSCOPIC LYSIS OF ADHESIONS;  Surgeon: [name], MD;  Location: [facility];  Service: General;  Laterality: N/A;  repair of abdominal wall hernia  . Irrigation and debridement abscess N/A 01/01/2014    Procedure: IRRIGATION AND DEBRIDEMENT ABSCESS;  Surgeon: [name], MD;  Location: [facility];  Service: General;  Laterality: N/A;  . Gastric bypass      Family History  Problem Relation Age of Onset  . Cancer Mother     colon  . Cancer Maternal Aunt     breast  . Depression Father   . Depression Sister   . Post-traumatic stress disorder Sister   . Alcohol  abuse Paternal Uncle   . Bipolar disorder Cousin   . Anxiety disorder Sister    Social History:  reports that she quit smoking about 11 years ago. Her smoking use included Cigars. She has never used smokeless tobacco. She reports that she does not drink alcohol or use illicit drugs.  Allergies:  Allergies  Allergen Reactions  . Amoxicillin Hives  . Bactrim [Sulfamethoxazole-Trimethoprim] Hives and Rash  . Lamictal [Lamotrigine] Hives and Rash     (Not in a hospital admission)  No results found for this or any previous visit (from the past 48 hour(s)). No results found.  Review of Systems  Constitutional: Negative.   HENT: Negative.   Eyes: Negative.   Respiratory: Negative.   Cardiovascular: Negative.   Gastrointestinal: Negative.   Musculoskeletal: Negative.   Skin: Negative.   Neurological: Negative.   Psychiatric/Behavioral: Positive for depression. Negative for suicidal ideas, hallucinations, memory loss and substance abuse. The patient is nervous/anxious. The patient does not have insomnia.     Blood pressure 114/77, pulse 66, temperature 98.2 F (36.8 C), temperature source Tympanic, resp. rate 16, weight 101.606 kg (224 lb), SpO2 98 %. Physical Exam  Nursing note and vitals reviewed. Constitutional: She appears well-developed and  well-nourished.  HENT:  Head: Normocephalic and atraumatic.  Eyes: Conjunctivae are normal. Pupils are equal, round, and reactive to light.  Neck: Normal range of motion.  Cardiovascular: Normal rate, regular rhythm and normal heart sounds.   Respiratory: Effort normal.  GI: Soft.  Musculoskeletal: Normal range of motion.  Neurological: She is alert.  Skin: Skin is warm and dry.  Psychiatric: Judgment and thought content normal. Her speech is delayed. She is slowed. Cognition and memory are normal. She exhibits a depressed mood.     Assessment/Plan Patient will return for treatment on Monday given partial improvement at this point  with continued reevaluation she agrees to the plan.  [name], MD 06/15/2015, 10:56 AM

## 2015-06-15 NOTE — Progress Notes (Signed)
I am writing this note at the request of Ms. Brittany [name] because of her recent need to miss work.. Beginning 06/11/2015 she has been undergoing a series of treatments that require her to be out of work. Treatments are typically occurring Monday Wednesday and Friday and on those days she is unable to work at all. At her discretion she may come in on alternate days but may also still be having side effects that would preclude effective working. Patient's treatment is anticipated to continue possibly for another week through March 10 although that date is flexible. Thank you for your consideration.

## 2015-06-15 NOTE — Anesthesia Postprocedure Evaluation (Signed)
Anesthesia Post Note  Patient: Brittany [name]  Procedure(s) Performed: * No procedures listed *  Patient location during evaluation: PACU Anesthesia Type: General Level of consciousness: awake and alert Pain management: pain level controlled Vital Signs Assessment: post-procedure vital signs reviewed and stable Respiratory status: spontaneous breathing and patient connected to face mask oxygen Cardiovascular status: blood pressure returned to baseline Postop Assessment: no headache Anesthetic complications: no    Last Vitals:  Filed Vitals:   06/15/15 1151 06/15/15 1203  BP:  125/76  Pulse: 78 77  Temp: 36.8 C   Resp: 16 18    Last Pain:  Filed Vitals:   06/15/15 1204  PainSc: 0-No pain                 Jazariah Teall M

## 2015-06-15 NOTE — Anesthesia Procedure Notes (Signed)
Date/Time: 06/15/2015 11:05 AM Performed by: Dionne Bucy Pre-anesthesia Checklist: Patient identified, Emergency Drugs available, Suction available and Patient being monitored Patient Re-evaluated:Patient Re-evaluated prior to inductionOxygen Delivery Method: Circle system utilized Preoxygenation: Pre-oxygenation with 100% oxygen Intubation Type: IV induction Ventilation: Mask ventilation without difficulty and Mask ventilation throughout procedure Airway Equipment and Method: Bite block Placement Confirmation: positive ETCO2 Dental Injury: Teeth and Oropharynx as per pre-operative assessment

## 2015-06-15 NOTE — Transfer of Care (Signed)
Immediate Anesthesia Transfer of Care Note  Patient: Brittany [name]  Procedure(s) Performed: ECT  Patient Location: PACU  Anesthesia Type:General  Level of Consciousness: awake and confused  Airway & Oxygen Therapy: Patient Spontanous Breathing and Patient connected to face mask oxygen  Post-op Assessment: Report given to RN  Post vital signs: Reviewed and stable  Last Vitals:  Filed Vitals:   06/15/15 0914 06/15/15 1116  BP: 114/77 111/75  Pulse: 66 77  Temp: 36.8 C 97.61F  Resp: 16 16  [id number]  Complications: No apparent anesthesia complications

## 2015-06-15 NOTE — Addendum Note (Signed)
Addendum  created 06/15/15 1209 by Elyse Hsu, MD   Modules edited: Clinical Notes   Clinical Notes:  File: [id number]

## 2015-06-15 NOTE — Progress Notes (Signed)
5oo fluids

## 2015-06-15 NOTE — Procedures (Signed)
ECT SERVICES Physician's Interval Evaluation & Treatment Note  Patient Identification: AMBUR HONORATO MRN:  [id number] Date of Evaluation:  06/15/2015 TX #: 13  MADRS:   MMSE:   P.E. Findings:  No change to physical exam no physical complaints. Vital signs stable. Lungs clear heart normal  Psychiatric Interval Note:  Mood is feeling partially improved but not yet well still some depression and hopelessness and low energy.  Subjective:  Patient is a 50 y.o. female seen for evaluation for Electroconvulsive Therapy. Follow-up Monday for continued treatment  Treatment Summary:   [x]   Right Unilateral             []  Bilateral   % Energy : 0.3 ms 90%   Impedance: 1190 ohms  Seizure Energy Index: 15,660 V squared  Postictal Suppression Index: 90%  Seizure Concordance Index: 97%  Medications  Pre Shock: Brevital 90 mg, succinylcholine 150 mg  Post Shock:    Seizure Duration: 31 seconds by EMG, 59 seconds by EEG   Comments: Follow-up on Monday as she is still in the phase of continued improvement   Lungs:  [x]   Clear to auscultation               []  Other:   Heart:    [x]   Regular rhythm             []  irregular rhythm    [x]   Previous H&P reviewed, patient examined and there are NO CHANGES                 []   Previous H&P reviewed, patient examined and there are changes noted.   [name], MD 3/3/201710:58 AM

## 2015-06-15 NOTE — Anesthesia Preprocedure Evaluation (Addendum)
Anesthesia Evaluation  Patient identified by MRN, date of birth, ID band Patient awake    Reviewed: Allergy & Precautions, NPO status , Patient's Chart, lab work & pertinent test results  Airway Mallampati: II  TM Distance: >3 FB     Dental  (+) Teeth Intact   Pulmonary sleep apnea and Continuous Positive Airway Pressure Ventilation , former smoker,  Not using CPAP and no sx since wt loss post gastric bypass.   Pulmonary exam normal        Cardiovascular Exercise Tolerance: Good (-) hypertensionNormal cardiovascular exam     Neuro/Psych Anxiety Depression    GI/Hepatic hiatal hernia, GERD  Controlled,  Endo/Other    Renal/GU      Musculoskeletal  (+) Arthritis , Osteoarthritis,    Abdominal Normal abdominal exam  (+)   Peds  Hematology   Anesthesia Other Findings   Reproductive/Obstetrics                            Anesthesia Physical Anesthesia Plan  ASA: III  Anesthesia Plan: General   Post-op Pain Management:    Induction: Intravenous  Airway Management Planned: Mask  Additional Equipment:   Intra-op Plan:   Post-operative Plan:   Informed Consent: I have reviewed the patients History and Physical, chart, labs and discussed the procedure including the risks, benefits and alternatives for the proposed anesthesia with the patient or authorized representative who has indicated his/her understanding and acceptance.     Plan Discussed with: CRNA  Anesthesia Plan Comments:         Anesthesia Quick Evaluation

## 2015-06-18 ENCOUNTER — Encounter: Payer: Self-pay | Admitting: Anesthesiology

## 2015-06-18 ENCOUNTER — Encounter (HOSPITAL_BASED_OUTPATIENT_CLINIC_OR_DEPARTMENT_OTHER)
Admission: RE | Admit: 2015-06-18 | Discharge: 2015-06-18 | Disposition: A | Discharge status: Home / Self Care | Payer: 59 | Source: Ambulatory Visit | Attending: Psychiatry | Admitting: Psychiatry

## 2015-06-18 DIAGNOSIS — F332 Major depressive disorder, recurrent severe without psychotic features: Secondary | ICD-10-CM | POA: Diagnosis not present

## 2015-06-18 MED ORDER — SODIUM CHLORIDE 0.9 % IV SOLN
250.0000 mL | Freq: Once | INTRAVENOUS | Status: AC
Start: 2015-06-18 — End: 2015-06-18
  Administered 2015-06-18: 10:00:00 via INTRAVENOUS

## 2015-06-18 MED ORDER — SODIUM CHLORIDE 0.9 % IV SOLN
250.0000 mL | Freq: Once | INTRAVENOUS | Status: AC
  Administered 2015-06-18: 500 mL via INTRAVENOUS

## 2015-06-18 MED ORDER — GLYCOPYRROLATE 0.2 MG/ML IJ SOLN
INTRAMUSCULAR | Status: DC | PRN
  Administered 2015-06-18: 0.2 mg via INTRAVENOUS

## 2015-06-18 MED ORDER — METHOHEXITAL SODIUM 100 MG/10ML IV SOSY
PREFILLED_SYRINGE | INTRAVENOUS | Status: DC | PRN
  Administered 2015-06-18: 90 mg via INTRAVENOUS

## 2015-06-18 MED ORDER — SUCCINYLCHOLINE CHLORIDE 20 MG/ML IJ SOLN
INTRAMUSCULAR | Status: DC | PRN
  Administered 2015-06-18: 150 mg via INTRAVENOUS

## 2015-06-18 NOTE — H&P (Signed)
Brittany [name] is an 50 y.o. female.   Chief Complaint: Patient has no new complaints. Mood is reported as being improved. Feels almost at her baseline. Denies suicidal thoughts denies any psychotic thoughts. Functioning better. No physical complaints. HPI: Patient with a history of recurrent severe depression receiving a updated course of ECT for treatment of depression and appears to have responded well. Good social support. Medically otherwise stable.  Past Medical History  Diagnosis Date  . Migraine   . Hypertension   . Hypercholesterolemia   . Depression   . Morbid obesity ([place])   . Sleep apnea     no CPAP machine   . H/O hiatal hernia   . GERD (gastroesophageal reflux disease)     hx of   . Arthritis     hips, knees and hands   . Anxiety   . PTSD (post-traumatic stress disorder)     Past Surgical History  Procedure Laterality Date  . Foot surgery    . Breath tek h pylori  05/03/2012    Procedure: BREATH TEK H PYLORI;  Surgeon: [name], MD;  Location: [name];  Service: General;  Laterality: N/A;  . Right tube and ovary removed     . Abdominal hysterectomy  1997 & 2006  . Colonoscopy      hx of benign polyps   . 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 Upper gi endoscopy N/A 07/05/2012    Procedure: UPPER GI ENDOSCOPY;  Surgeon: [name], MD;  Location: [facility];  Service: General;  Laterality: N/A;  . Laparoscopic lysis of adhesions N/A 07/05/2012    Procedure: LAPAROSCOPIC LYSIS OF ADHESIONS;  Surgeon: [name], MD;  Location: [facility];  Service: General;  Laterality: N/A;  repair of abdominal wall hernia  . Irrigation and debridement abscess N/A 01/01/2014    Procedure: IRRIGATION AND DEBRIDEMENT ABSCESS;  Surgeon: [name], MD;  Location: [facility];  Service: General;  Laterality: N/A;  . Gastric bypass      Family History   Problem Relation Age of Onset  . Cancer Mother     colon  . Cancer Maternal Aunt     breast  . Depression Father   . Depression Sister   . Post-traumatic stress disorder Sister   . Alcohol abuse Paternal Uncle   . Bipolar disorder Cousin   . Anxiety disorder Sister    Social History:  reports that she quit smoking about 11 years ago. Her smoking use included Cigars. She has never used smokeless tobacco. She reports that she does not drink alcohol or use illicit drugs.  Allergies:  Allergies  Allergen Reactions  . Amoxicillin Hives  . Bactrim [Sulfamethoxazole-Trimethoprim] Hives and Rash  . Lamictal [Lamotrigine] Hives and Rash     (Not in a hospital admission)  No results found for this or any previous visit (from the past 48 hour(s)). No results found.  Review of Systems  Constitutional: Negative.   HENT: Negative.   Eyes: Negative.   Respiratory: Negative.   Cardiovascular: Negative.   Gastrointestinal: Negative.   Musculoskeletal: Negative.   Skin: Negative.   Neurological: Negative.   Psychiatric/Behavioral: Negative for depression, suicidal ideas, hallucinations, memory loss and substance abuse. The patient is not nervous/anxious and does not have insomnia.     Blood pressure 145/83, pulse 59, temperature 96.5 F (35.8 C),  temperature source Tympanic, resp. rate 18, height 5\' 10"  (1.778 m), weight 101.152 kg (223 lb), SpO2 100 %. Physical Exam  Nursing note and vitals reviewed. Constitutional: She appears well-developed and well-nourished.  HENT:  Head: Normocephalic and atraumatic.  Eyes: Conjunctivae are normal. Pupils are equal, round, and reactive to light.  Neck: Normal range of motion.  Cardiovascular: Normal rate, regular rhythm and normal heart sounds.   Respiratory: Effort normal.  GI: Soft.  Musculoskeletal: Normal range of motion.  Neurological: She is alert.  Skin: Skin is warm and dry.  Psychiatric: She has a normal mood and affect. Her  behavior is normal. Judgment and thought content normal.     Assessment/Plan Patient and I discussed possible options and agreed that we will see her back in 1 week rather than on Wednesday. She is agreeable to the plan. She no she can get in touch sooner if needed. No change to medicine. Treatment will be performed today as scheduled.  [name], MD 06/18/2015, 10:11 AM

## 2015-06-18 NOTE — Procedures (Signed)
ECT SERVICES Physician's Interval Evaluation & Treatment Note  Patient Identification: VERTELL YOUNKIN MRN:  [id number] Date of Evaluation:  06/18/2015 TX #: 14  MADRS: 16  MMSE: 30  P.E. Findings:  No physical exam changes. Vital signs stable. Lungs clear heart normal  Psychiatric Interval Note:  Mood is reported as feeling better nearly normal no major depression no suicidal thoughts no psychosis.  Subjective:  Patient is a 50 y.o. female seen for evaluation for Electroconvulsive Therapy. No specific new complaints. Feeling much better.  Treatment Summary:   [x]   Right Unilateral             []  Bilateral   % Energy : 0.3 ms 90%   Impedance: 1350 ohms  Seizure Energy Index: 15,600 V squared  Postictal Suppression Index: 81%  Seizure Concordance Index: 98%  Medications  Pre Shock: Brevital 90 mg, succinylcholine 150 mg  Post Shock:    Seizure Duration: 27 seconds by EMG, 52 seconds by EEG   Comments: Follow-up one week on March 13   Lungs:  [x]   Clear to auscultation               []  Other:   Heart:    [x]   Regular rhythm             []  irregular rhythm    [x]   Previous H&P reviewed, patient examined and there are NO CHANGES                 []   Previous H&P reviewed, patient examined and there are changes noted.   [name], MD 3/6/201710:26 AM

## 2015-06-18 NOTE — Transfer of Care (Signed)
Immediate Anesthesia Transfer of Care Note  Patient: Brittany [name]  Procedure(s) Performed: ECT   Patient Location: PACU  Anesthesia Type:General  Level of Consciousness: awake, alert  and oriented  Airway & Oxygen Therapy: Patient Spontanous Breathing  Post-op Assessment: Report given to RN  Post vital signs: stable  Last Vitals:  Filed Vitals:   06/18/15 0844  BP: 145/83  Pulse: 59  Temp: 35.8 C  Resp: 18    Complications: No apparent anesthesia complications

## 2015-06-18 NOTE — Anesthesia Preprocedure Evaluation (Signed)
Anesthesia Evaluation  Patient identified by MRN, date of birth, ID band Patient awake    Reviewed: Allergy & Precautions, NPO status , Patient's Chart, lab work & pertinent test results  History of Anesthesia Complications Negative for: history of anesthetic complications  Airway Mallampati: II  TM Distance: >3 FB     Dental  (+) Teeth Intact   Pulmonary sleep apnea and Continuous Positive Airway Pressure Ventilation , former smoker,  Not using CPAP and no sx since wt loss post gastric bypass.   Pulmonary exam normal        Cardiovascular Exercise Tolerance: Good hypertension, Normal cardiovascular exam     Neuro/Psych    GI/Hepatic hiatal hernia, GERD  Controlled,  Endo/Other    Renal/GU      Musculoskeletal  (+) Arthritis , Osteoarthritis,    Abdominal Normal abdominal exam  (+)   Peds  Hematology   Anesthesia Other Findings   Reproductive/Obstetrics                             Anesthesia Physical  Anesthesia Plan  ASA: III  Anesthesia Plan: General   Post-op Pain Management:    Induction: Intravenous  Airway Management Planned: Mask  Additional Equipment:   Intra-op Plan:   Post-operative Plan:   Informed Consent: I have reviewed the patients History and Physical, chart, labs and discussed the procedure including the risks, benefits and alternatives for the proposed anesthesia with the patient or authorized representative who has indicated his/her understanding and acceptance.     Plan Discussed with: CRNA  Anesthesia Plan Comments:         Anesthesia Quick Evaluation

## 2015-06-19 ENCOUNTER — Ambulatory Visit (INDEPENDENT_AMBULATORY_CARE_PROVIDER_SITE_OTHER): Payer: 59 | Admitting: Psychiatry

## 2015-06-19 ENCOUNTER — Encounter (HOSPITAL_COMMUNITY): Payer: Self-pay | Admitting: Psychiatry

## 2015-06-19 VITALS — BP 121/75 | HR 74 | Ht 70.0 in | Wt 221.8 lb

## 2015-06-19 DIAGNOSIS — F331 Major depressive disorder, recurrent, moderate: Secondary | ICD-10-CM

## 2015-06-19 DIAGNOSIS — F4310 Post-traumatic stress disorder, unspecified: Secondary | ICD-10-CM | POA: Diagnosis not present

## 2015-06-19 NOTE — Addendum Note (Signed)
Addendum  created 06/19/15 1715 by Aline Brochure, CRNA   Modules edited: Charges VN

## 2015-06-19 NOTE — Anesthesia Postprocedure Evaluation (Signed)
Anesthesia Post Note  Patient: Brittany [name]  Procedure(s) Performed: * No procedures listed *  Patient location during evaluation: PACU Anesthesia Type: General Level of consciousness: awake and alert Pain management: pain level controlled Vital Signs Assessment: post-procedure vital signs reviewed and stable Respiratory status: spontaneous breathing, nonlabored ventilation, respiratory function stable and patient connected to nasal cannula oxygen Cardiovascular status: blood pressure returned to baseline and stable Postop Assessment: no signs of nausea or vomiting Anesthetic complications: no    Last Vitals:  Filed Vitals:   06/18/15 1116 06/18/15 1127  BP: 109/69 113/71  Pulse: 83 75  Temp: 37.4 C   Resp: 16 16    Last Pain: There were no vitals filed for this visit.               Martha Clan
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
## 2015-06-25 ENCOUNTER — Encounter: Payer: Self-pay | Admitting: Anesthesiology

## 2015-06-25 ENCOUNTER — Encounter
Admission: RE | Admit: 2015-06-25 | Discharge: 2015-06-25 | Disposition: A | Discharge status: Home / Self Care | Payer: 59 | Source: Ambulatory Visit | Attending: Psychiatry | Admitting: Psychiatry

## 2015-06-25 DIAGNOSIS — F4310 Post-traumatic stress disorder, unspecified: Secondary | ICD-10-CM | POA: Diagnosis not present

## 2015-06-25 DIAGNOSIS — E7800 Pure hypercholesterolemia, unspecified: Secondary | ICD-10-CM | POA: Insufficient documentation

## 2015-06-25 DIAGNOSIS — F332 Major depressive disorder, recurrent severe without psychotic features: Secondary | ICD-10-CM | POA: Diagnosis not present

## 2015-06-25 DIAGNOSIS — I10 Essential (primary) hypertension: Secondary | ICD-10-CM | POA: Insufficient documentation

## 2015-06-25 DIAGNOSIS — K219 Gastro-esophageal reflux disease without esophagitis: Secondary | ICD-10-CM | POA: Insufficient documentation

## 2015-06-25 DIAGNOSIS — Z880 Allergy status to penicillin: Secondary | ICD-10-CM | POA: Diagnosis not present

## 2015-06-25 DIAGNOSIS — G4730 Sleep apnea, unspecified: Secondary | ICD-10-CM | POA: Insufficient documentation

## 2015-06-25 DIAGNOSIS — Z87891 Personal history of nicotine dependence: Secondary | ICD-10-CM | POA: Diagnosis not present

## 2015-06-25 DIAGNOSIS — Z6832 Body mass index (BMI) 32.0-32.9, adult: Secondary | ICD-10-CM | POA: Diagnosis not present

## 2015-06-25 DIAGNOSIS — F419 Anxiety disorder, unspecified: Secondary | ICD-10-CM | POA: Diagnosis not present

## 2015-06-25 DIAGNOSIS — M1990 Unspecified osteoarthritis, unspecified site: Secondary | ICD-10-CM | POA: Insufficient documentation

## 2015-06-25 MED ORDER — SODIUM CHLORIDE 0.9 % IV SOLN
INTRAVENOUS | Status: DC | PRN
  Administered 2015-06-25: 10:00:00 via INTRAVENOUS

## 2015-06-25 MED ORDER — SODIUM CHLORIDE 0.9 % IV SOLN
250.0000 mL | Freq: Once | INTRAVENOUS | Status: AC
  Administered 2015-06-25: 500 mL via INTRAVENOUS

## 2015-06-25 MED ORDER — METHOHEXITAL SODIUM 100 MG/10ML IV SOSY
PREFILLED_SYRINGE | INTRAVENOUS | Status: DC | PRN
  Administered 2015-06-25: 90 mg via INTRAVENOUS

## 2015-06-25 MED ORDER — SUCCINYLCHOLINE CHLORIDE 20 MG/ML IJ SOLN
INTRAMUSCULAR | Status: DC | PRN
  Administered 2015-06-25: 150 mg via INTRAVENOUS

## 2015-06-25 MED ORDER — LABETALOL HCL 5 MG/ML IV SOLN: INTRAVENOUS | Status: DC | PRN

## 2015-06-25 NOTE — Transfer of Care (Signed)
Immediate Anesthesia Transfer of Care Note  Patient: Brittany [name]  Procedure(s) Performed: * No procedures listed *  Patient Location: PACU  Anesthesia Type:General  Level of Consciousness: awake, alert  and confused  Airway & Oxygen Therapy: Patient Spontanous Breathing and Patient connected to face mask oxygen  Post-op Assessment: Report given to RN and Post -op Vital signs reviewed and stable  Post vital signs: Reviewed and stable  Last Vitals:  Filed Vitals:   06/25/15 0828 06/25/15 1047  BP: 136/69 133/90  Pulse: 60   Temp: 35.6 C 36.8 C  Resp: 18     Complications: No apparent anesthesia complications

## 2015-06-25 NOTE — Procedures (Signed)
ECT SERVICES Physician's Interval Evaluation & Treatment Note  Patient Identification: Brittany [name] MRN:  [id number] Date of Evaluation:  06/25/2015 TX #: 15  MADRS:   MMSE:   P.E. Findings:  No physical exam findings. Vital signs normal. Lungs clear heart normal.  Psychiatric Interval Note:  Mood continues to feel good no return of suicidal ideation  Subjective:  Patient is a 50 y.o. female seen for evaluation for Electroconvulsive Therapy. Minor memory impairment not enough to affect her work  Treatment Summary:   []   Right Unilateral             []  Bilateral   % Energy : 0.3 ms 90%   Impedance: 1130 ohms  Seizure Energy Index: 14,860 V squared  Postictal Suppression Index: 78%  Seizure Concordance Index: 98%  Medications  Pre Shock: Brevital 90 mg, succinylcholine 150 mg  Post Shock: None  Seizure Duration: 35 seconds by EMG, 64 seconds by EEG   Comments: Patient will return in 3 weeks which is April 3. She knows contact myself or her provider if needed sooner. She is aware that I will be away from the hospital the week of March 27-31.   Lungs:  [x]   Clear to auscultation               []  Other:   Heart:    [x]   Regular rhythm             []  irregular rhythm    [x]   Previous H&P reviewed, patient examined and there are NO CHANGES                 []   Previous H&P reviewed, patient examined and there are changes noted.   [name], MD 3/13/201710:30 AM

## 2015-06-25 NOTE — Anesthesia Preprocedure Evaluation (Signed)
Anesthesia Evaluation  Patient identified by MRN, date of birth, ID band Patient awake    Reviewed: Allergy & Precautions, H&P , NPO status , Patient's Chart, lab work & pertinent test results, reviewed documented beta blocker date and time   Airway Mallampati: II   Neck ROM: full    Dental  (+) Poor Dentition   Pulmonary neg pulmonary ROS, sleep apnea , former smoker,    Pulmonary exam normal        Cardiovascular hypertension, negative cardio ROS Normal cardiovascular exam     Neuro/Psych  Headaches, PSYCHIATRIC DISORDERS Anxiety Depression negative neurological ROS  negative psych ROS   GI/Hepatic negative GI ROS, Neg liver ROS, hiatal hernia, GERD  ,  Endo/Other  negative endocrine ROS  Renal/GU negative Renal ROS  negative genitourinary   Musculoskeletal   Abdominal   Peds  Hematology negative hematology ROS (+) anemia ,   Anesthesia Other Findings Past Medical History:   Migraine                                                     Hypertension                                                 Hypercholesterolemia                                         Depression                                                   Morbid obesity ([place])                                         Sleep apnea                                                    Comment:no CPAP machine    H/O hiatal hernia                                            GERD (gastroesophageal reflux disease)                         Comment:hx of    Arthritis                                                      Comment:hips, knees and hands  Anxiety                                                      PTSD (post-traumatic stress disorder)                      Past Surgical History:   FOOT SURGERY                                                  BREATH TEK H PYLORI                              05/03/2012      Comment:Procedure: BREATH TEK H PYLORI;   Surgeon: [name], MD;  Location: [facility];                Service: General;  Laterality: N/A;   right tube and ovary removed                                  ABDOMINAL HYSTERECTOMY                           1997 & 20*   COLONOSCOPY                                                     Comment:hx of benign polyps    GASTRIC ROUX-EN-Y                               N/A 07/05/2012      Comment:Procedure: LAPAROSCOPIC ROUX-EN-Y GASTRIC;                Surgeon: [name], MD;  Location: [facility];              Service: General;  Laterality: N/A;   UPPER GI ENDOSCOPY                              N/A 07/05/2012      Comment:Procedure: UPPER GI ENDOSCOPY;  Surgeon: [name], MD;  Location: [facility];  Service:               General;  Laterality: N/A;   LAPAROSCOPIC LYSIS OF ADHESIONS                 N/A 07/05/2012      Comment:Procedure: LAPAROSCOPIC LYSIS OF ADHESIONS;                Surgeon: [name], MD;  Location: [facility];  Service: General;  Laterality: N/A;  repair of               abdominal wall hernia   IRRIGATION AND DEBRIDEMENT ABSCESS              N/A 01/01/2014      Comment:Procedure: IRRIGATION AND DEBRIDEMENT ABSCESS;               Surgeon: [name], MD;  [facility]: [facility];  Service: General;  Laterality: N/A;   GASTRIC BYPASS                                              BMI    Body Mass Index   31.99 kg/m 2     Reproductive/Obstetrics                             Anesthesia Physical Anesthesia Plan  ASA: III  Anesthesia Plan: General   Post-op Pain Management:    Induction:   Airway Management Planned:   Additional Equipment:   Intra-op Plan:   Post-operative Plan:   Informed Consent: I have reviewed the patients History and Physical, chart, labs and discussed the procedure including the risks, benefits and alternatives for the proposed anesthesia with the patient  or authorized representative who has indicated his/her understanding and acceptance.   Dental Advisory Given  Plan Discussed with: CRNA  Anesthesia Plan Comments:         Anesthesia Quick Evaluation

## 2015-06-25 NOTE — H&P (Signed)
Brittany [name] is an 50 y.o. female.   Chief Complaint: With major depression who has had a return of symptoms now with significant improvement with a shorter index course and switching to maintenance. Since last week her mood has remained stable and good. No return of depression or suicidal thoughts. Does note a little bit of memory impairment but not enough to impair her working HPI: History of recurrent severe major depression which returned but has not responded to medication and ECT  Past Medical History  Diagnosis Date  . Migraine   . Hypertension   . Hypercholesterolemia   . Depression   . Morbid obesity ([place])   . Sleep apnea     no CPAP machine   . H/O hiatal hernia   . GERD (gastroesophageal reflux disease)     hx of   . Arthritis     hips, knees and hands   . Anxiety   . PTSD (post-traumatic stress disorder)     Past Surgical History  Procedure Laterality Date  . Foot surgery    . Breath tek h pylori  05/03/2012    Procedure: BREATH TEK H PYLORI;  Surgeon: [name], MD;  Location: [name];  Service: General;  Laterality: N/A;  . Right tube and ovary removed     . Abdominal hysterectomy  1997 & 2006  . Colonoscopy      hx of benign polyps   . 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 Upper gi endoscopy N/A 07/05/2012    Procedure: UPPER GI ENDOSCOPY;  Surgeon: [name], MD;  Location: [facility];  Service: General;  Laterality: N/A;  . Laparoscopic lysis of adhesions N/A 07/05/2012    Procedure: LAPAROSCOPIC LYSIS OF ADHESIONS;  Surgeon: [name], MD;  Location: [facility];  Service: General;  Laterality: N/A;  repair of abdominal wall hernia  . Irrigation and debridement abscess N/A 01/01/2014    Procedure: IRRIGATION AND DEBRIDEMENT ABSCESS;  Surgeon: [name], MD;  Location: [facility];  Service: General;  Laterality: N/A;  .  Gastric bypass      Family History  Problem Relation Age of Onset  . Cancer Mother     colon  . Cancer Maternal Aunt     breast  . Depression Father   . Depression Sister   . Post-traumatic stress disorder Sister   . Alcohol abuse Paternal Uncle   . Bipolar disorder Cousin   . Anxiety disorder Sister    Social History:  reports that she quit smoking about 11 years ago. Her smoking use included Cigars. She has never used smokeless tobacco. She reports that she does not drink alcohol or use illicit drugs.  Allergies:  Allergies  Allergen Reactions  . Amoxicillin Hives  . Bactrim [Sulfamethoxazole-Trimethoprim] Hives and Rash  . Lamictal [Lamotrigine] Hives and Rash     (Not in a hospital admission)  No results found for this or any previous visit (from the past 48 hour(s)). No results found.  Review of Systems  Constitutional: Negative.   HENT: Negative.   Eyes: Negative.   Respiratory: Negative.   Cardiovascular: Negative.   Gastrointestinal: Negative.   Musculoskeletal: Negative.   Skin: Negative.   Neurological: Negative.   Psychiatric/Behavioral: Positive for memory loss. Negative for depression, suicidal ideas, hallucinations and substance abuse. The patient does not have insomnia.  Blood pressure 136/69, pulse 60, temperature 96.1 F (35.6 C), temperature source Oral, resp. rate 18, height 5\' 10"  (1.778 m), weight 101.152 kg (223 lb), SpO2 97 %. Physical Exam  Nursing note and vitals reviewed. Constitutional: She appears well-developed and well-nourished.  HENT:  Head: Normocephalic and atraumatic.  Eyes: Conjunctivae are normal. Pupils are equal, round, and reactive to light.  Neck: Normal range of motion.  Cardiovascular: Normal rate, regular rhythm and normal heart sounds.   Respiratory: Effort normal and breath sounds normal. No respiratory distress.  GI: Soft.  Musculoskeletal: Normal range of motion.  Neurological: She is alert.  Skin: Skin is  warm and dry.  Psychiatric: She has a normal mood and affect. Her speech is normal and behavior is normal. Judgment and thought content normal. She exhibits abnormal recent memory.     Assessment/Plan Maintenance ECT treatment today next one by our agreement in 3 weeks on April 3  [name], MD 06/25/2015, 10:27 AM

## 2015-06-25 NOTE — Anesthesia Postprocedure Evaluation (Signed)
Anesthesia Post Note  Patient: Brittany [name]  Procedure(s) Performed: * No procedures listed *  Patient location during evaluation: PACU Anesthesia Type: General Level of consciousness: awake and alert Pain management: pain level controlled Vital Signs Assessment: post-procedure vital signs reviewed and stable Respiratory status: spontaneous breathing, nonlabored ventilation, respiratory function stable and patient connected to nasal cannula oxygen Cardiovascular status: blood pressure returned to baseline and stable Postop Assessment: no signs of nausea or vomiting Anesthetic complications: no    Last Vitals:  Filed Vitals:   06/25/15 0828 06/25/15 1047  BP: 136/69 133/90  Pulse: 60   Temp: 35.6 C 36.8 C  Resp: 18     Last Pain:  Filed Vitals:   06/25/15 1110  PainSc: 0-No pain                 Molli Barrows

## 2015-06-26 ENCOUNTER — Telehealth (HOSPITAL_COMMUNITY): Payer: Self-pay | Admitting: *Deleted

## 2015-06-26 NOTE — Telephone Encounter (Signed)
Called for authorization of ECT. Was told insurance changed to [facility] 04/15/15. Called [phone] was told by Lonia Farber. That no pre-certification is required. Reference [id number].

## 2015-07-04 ENCOUNTER — Encounter: Payer: Self-pay | Admitting: Anesthesiology

## 2015-07-04 ENCOUNTER — Encounter (HOSPITAL_BASED_OUTPATIENT_CLINIC_OR_DEPARTMENT_OTHER)
Admission: RE | Admit: 2015-07-04 | Discharge: 2015-07-04 | Disposition: A | Discharge status: Home / Self Care | Payer: 59 | Source: Ambulatory Visit | Attending: Psychiatry | Admitting: Psychiatry

## 2015-07-04 DIAGNOSIS — F332 Major depressive disorder, recurrent severe without psychotic features: Secondary | ICD-10-CM

## 2015-07-04 MED ORDER — SUCCINYLCHOLINE CHLORIDE 20 MG/ML IJ SOLN
INTRAMUSCULAR | Status: DC | PRN
  Administered 2015-07-04: 150 mg via INTRAVENOUS

## 2015-07-04 MED ORDER — SODIUM CHLORIDE 0.9 % IV SOLN
250.0000 mL | Freq: Once | INTRAVENOUS | Status: AC
  Administered 2015-07-04: 500 mL via INTRAVENOUS

## 2015-07-04 MED ORDER — GLYCOPYRROLATE 0.2 MG/ML IJ SOLN
0.2000 mg | Freq: Once | INTRAMUSCULAR | Status: AC
  Administered 2015-07-04: 0.2 mg via INTRAVENOUS

## 2015-07-04 MED ORDER — METHOHEXITAL SODIUM 100 MG/10ML IV SOSY
PREFILLED_SYRINGE | INTRAVENOUS | Status: DC | PRN
  Administered 2015-07-04: 90 mg via INTRAVENOUS

## 2015-07-04 MED ORDER — SODIUM CHLORIDE 0.9 % IV SOLN
INTRAVENOUS | Status: DC | PRN
  Administered 2015-07-04: 10:00:00 via INTRAVENOUS

## 2015-07-04 NOTE — H&P (Signed)
Brittany [name] is an 50 y.o. female.   Chief Complaint: Patient reports mood has been worse. Depressed. Anxious. Trouble focusing at work. Suicidal thoughts without intent or plan and with lucidity about this that allows her to be certain she will not act on it. HPI: Recurrent severe depression being treated with ECT current worsening of symptoms.  Past Medical History  Diagnosis Date  . Migraine   . Hypertension   . Hypercholesterolemia   . Depression   . Morbid obesity ([place])   . Sleep apnea     no CPAP machine   . H/O hiatal hernia   . GERD (gastroesophageal reflux disease)     hx of   . Arthritis     hips, knees and hands   . Anxiety   . PTSD (post-traumatic stress disorder)     Past Surgical History  Procedure Laterality Date  . Foot surgery    . Breath tek h pylori  05/03/2012    Procedure: BREATH TEK H PYLORI;  Surgeon: [name], MD;  Location: [name];  Service: General;  Laterality: N/A;  . Right tube and ovary removed     . Abdominal hysterectomy  1997 & 2006  . Colonoscopy      hx of benign polyps   . 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 Upper gi endoscopy N/A 07/05/2012    Procedure: UPPER GI ENDOSCOPY;  Surgeon: [name], MD;  Location: [facility];  Service: General;  Laterality: N/A;  . Laparoscopic lysis of adhesions N/A 07/05/2012    Procedure: LAPAROSCOPIC LYSIS OF ADHESIONS;  Surgeon: [name], MD;  Location: [facility];  Service: General;  Laterality: N/A;  repair of abdominal wall hernia  . Irrigation and debridement abscess N/A 01/01/2014    Procedure: IRRIGATION AND DEBRIDEMENT ABSCESS;  Surgeon: [name], MD;  Location: [facility];  Service: General;  Laterality: N/A;  . Gastric bypass      Family History  Problem Relation Age of Onset  . Cancer Mother     colon  . Cancer Maternal Aunt     breast  .  Depression Father   . Depression Sister   . Post-traumatic stress disorder Sister   . Alcohol abuse Paternal Uncle   . Bipolar disorder Cousin   . Anxiety disorder Sister    Social History:  reports that she quit smoking about 11 years ago. Her smoking use included Cigars. She has never used smokeless tobacco. She reports that she does not drink alcohol or use illicit drugs.  Allergies:  Allergies  Allergen Reactions  . Amoxicillin Hives  . Bactrim [Sulfamethoxazole-Trimethoprim] Hives and Rash  . Lamictal [Lamotrigine] Hives and Rash     (Not in a hospital admission)  No results found for this or any previous visit (from the past 48 hour(s)). No results found.  Review of Systems  Constitutional: Negative.   HENT: Negative.   Eyes: Negative.   Respiratory: Negative.   Cardiovascular: Negative.   Gastrointestinal: Negative.   Musculoskeletal: Negative.   Skin: Negative.   Neurological: Negative.   Psychiatric/Behavioral: Positive for depression, suicidal ideas and memory loss. Negative for hallucinations and substance abuse. The patient is nervous/anxious and has insomnia.     Blood pressure 124/82, pulse 59, temperature 97.3 F (36.3 C), resp. rate 18, height 5\' 10"  (1.778 m), weight 102.059 kg (225  lb), SpO2 97 %. Physical Exam  Nursing note and vitals reviewed. Constitutional: She appears well-developed and well-nourished.  HENT:  Head: Normocephalic and atraumatic.  Eyes: Conjunctivae are normal. Pupils are equal, round, and reactive to light.  Neck: Normal range of motion.  Cardiovascular: Normal rate, regular rhythm and normal heart sounds.   Respiratory: Effort normal and breath sounds normal. No respiratory distress.  GI: Soft.  Musculoskeletal: Normal range of motion.  Neurological: She is alert.  Skin: Skin is warm and dry.  Psychiatric: Her speech is normal. Judgment and thought content normal. She is slowed. Cognition and memory are normal. She exhibits a  depressed mood. She expresses no suicidal plans.     Assessment/Plan Treatment today also Friday I will be away next week we will schedule follow-up for the week after that again she is to see her outpatient psychiatrist meanwhile.  [name], MD 07/04/2015, 10:19 AM

## 2015-07-04 NOTE — Discharge Instructions (Addendum)
1)  The drugs that you have been given will stay in your system until tomorrow so for the       next 24 hours you should not:  A. Drive an automobile  B. Make any legal decisions  C. Drink any alcoholic beverages  2)  You may resume your regular meals upon return home.  3)  A responsible adult must take you home.  Someone should stay with you for a few          hours, then be available by phone for the remainder of the treatment day.  4)  You May experience any of the following symptoms:  Headache, Nausea and a dry mouth (due to the medications you were given),  temporary memory loss and some confusion, or sore muscles (a warm bath  should help this).  If you you experience any of these symptoms let us know on                your return visit.  5)  Report any of the following: any acute discomfort, severe headache, or temperature        greater than 100.5 F.   Also report any unusual redness, swelling, drainage, or pain         at your IV site.    You may report Symptoms to:  [place] [facility]          Phone: [phone], ECT Department           or Dr. Prescott Gum office [phone]  6)  Your next ECT Treatment is Day Friday  Date March 24  We will call 2 days prior to your scheduled appointment for arrival times.  7)  Nothing to eat or drink after midnight the night before your procedure.  8)  Take .     With a sip of water the morning of your procedure.  9)  Other Instructions: Call [phone] to cancel the morning of your procedure due         to illness or emergency.  10) We will call within 72 hours to assess how you are feeling.

## 2015-07-04 NOTE — Anesthesia Postprocedure Evaluation (Signed)
Anesthesia Post Note  Patient: Brittany [name]  Procedure(s) Performed: * No procedures listed *  Patient location during evaluation: PACU Anesthesia Type: General Level of consciousness: awake and alert Pain management: pain level controlled Vital Signs Assessment: post-procedure vital signs reviewed and stable Respiratory status: spontaneous breathing and respiratory function stable Cardiovascular status: stable Anesthetic complications: no    Last Vitals:  Filed Vitals:   07/04/15 1100 07/04/15 1112  BP: 106/62 126/87  Pulse:  83  Temp: 36.3 C   Resp:  16    Last Pain:  Filed Vitals:   07/04/15 1114  PainSc: 0-No pain                 Kayline Sheer K

## 2015-07-04 NOTE — Procedures (Signed)
ECT SERVICES Physician's Interval Evaluation & Treatment Note  Patient Identification: Brittany [name] MRN:  [id number] Date of Evaluation:  07/04/2015 TX #: [id number]  MADRS:   MMSE:   P.E. Findings:  No change to physical exam vitals stable.  Psychiatric Interval Note:  Mood is more depressed. Suicidal thoughts without any intention or plan and no psychosis  Subjective:  Patient is a 50 y.o. female seen for evaluation for Electroconvulsive Therapy. Feeling worse more down and trouble concentrating  Treatment Summary:   [x]   Right Unilateral             []  Bilateral   % Energy : 0.3 ms 90%   Impedance: 900 ohms  Seizure Energy Index: 18,572 V squared  Postictal Suppression Index: 79%  Seizure Concordance Index: 97%  Medications  Pre Shock: Robinul 0.2 mg, Brevital 90 mg, succinylcholine 150 mg  Post Shock: None  Seizure Duration: 37 seconds by EMG, 64 seconds by EEG   Comments: Follow-up scheduled for this Friday. Patient is able to articulate clear understanding that she is not going to act on harming herself and has solid reasons to not do so. She knows that she can get immediate help if things are worsening.   Lungs:  [x]   Clear to auscultation               []  Other:   Heart:    [x]   Regular rhythm             []  irregular rhythm    [x]   Previous H&P reviewed, patient examined and there are NO CHANGES                 []   Previous H&P reviewed, patient examined and there are changes noted.   [name], MD 3/22/201710:21 AM

## 2015-07-04 NOTE — Anesthesia Preprocedure Evaluation (Signed)
Anesthesia Evaluation  Patient identified by MRN, date of birth, ID band Patient awake    Reviewed: Allergy & Precautions, H&P , NPO status , Patient's Chart, lab work & pertinent test results, reviewed documented beta blocker date and time   History of Anesthesia Complications Negative for: history of anesthetic complications  Airway Mallampati: II   Neck ROM: full    Dental  (+) Poor Dentition   Pulmonary neg pulmonary ROS, sleep apnea , former smoker,    Pulmonary exam normal        Cardiovascular hypertension, negative cardio ROS Normal cardiovascular exam     Neuro/Psych  Headaches, PSYCHIATRIC DISORDERS Anxiety Depression negative neurological ROS  negative psych ROS   GI/Hepatic negative GI ROS, Neg liver ROS, hiatal hernia, GERD  ,  Endo/Other  negative endocrine ROS  Renal/GU negative Renal ROS  negative genitourinary   Musculoskeletal   Abdominal   Peds  Hematology negative hematology ROS (+) anemia ,   Anesthesia Other Findings Past Medical History:   Migraine                                                     Hypertension                                                 Hypercholesterolemia                                         Depression                                                   Morbid obesity ([facility])                                         Sleep apnea                                                    Comment:no CPAP machine    H/O hiatal hernia                                            GERD (gastroesophageal reflux disease)                         Comment:hx of    Arthritis  Comment:hips, knees and hands    Anxiety                                                      PTSD (post-traumatic stress disorder)                      Past Surgical History:   FOOT SURGERY                                                  BREATH TEK H PYLORI                               05/03/2012      Comment:Procedure: BREATH TEK H PYLORI;  Surgeon: [name], MD;  Location: [facility];                Service: General;  Laterality: N/A;   right tube and ovary removed                                  ABDOMINAL HYSTERECTOMY                           1997 & 20*   COLONOSCOPY                                                     Comment:hx of benign polyps    GASTRIC ROUX-EN-Y                               N/A 07/05/2012      Comment:Procedure: LAPAROSCOPIC ROUX-EN-Y GASTRIC;                Surgeon: [name], MD;  Location: [facility];              Service: General;  Laterality: N/A;   UPPER GI ENDOSCOPY                              N/A 07/05/2012      Comment:Procedure: UPPER GI ENDOSCOPY;  Surgeon: [name], MD;  Location: [facility];  Service:               General;  Laterality: N/A;   LAPAROSCOPIC LYSIS OF ADHESIONS                 N/A 07/05/2012      Comment:Procedure: LAPAROSCOPIC LYSIS OF ADHESIONS;                Surgeon: [name], MD;  Location: [facility];  Service: General;  Laterality: N/A;  repair of               abdominal wall hernia   IRRIGATION AND DEBRIDEMENT ABSCESS              N/A 01/01/2014      Comment:Procedure: IRRIGATION AND DEBRIDEMENT ABSCESS;               Surgeon: [name], MD;  Location: [facility];  Service: General;  Laterality: N/A;   GASTRIC BYPASS                                              BMI    Body Mass Index   31.99 kg/m 2     Reproductive/Obstetrics                             Anesthesia Physical  Anesthesia Plan  ASA: III  Anesthesia Plan: General   Post-op Pain Management:    Induction:   Airway Management Planned:   Additional Equipment:   Intra-op Plan:   Post-operative Plan:   Informed Consent: I have reviewed the patients History and Physical, chart, labs and discussed the procedure  including the risks, benefits and alternatives for the proposed anesthesia with the patient or authorized representative who has indicated his/her understanding and acceptance.   Dental Advisory Given  Plan Discussed with: CRNA  Anesthesia Plan Comments:         Anesthesia Quick Evaluation

## 2015-07-04 NOTE — Transfer of Care (Signed)
Immediate Anesthesia Transfer of Care Note  Patient: Brittany [name]  Procedure(s) Performed: * No procedures listed *  Patient Location: PACU  Anesthesia Type:General  Level of Consciousness: awake and alert   Airway & Oxygen Therapy: Patient Spontanous Breathing and Patient connected to face mask oxygen  Post-op Assessment: Report given to RN and Post -op Vital signs reviewed and stable  Post vital signs: Reviewed and stable  Last Vitals:  Filed Vitals:   07/04/15 0804  BP: 124/82  Pulse: 59  Temp: 36.3 C  Resp: 18    Complications: No apparent anesthesia complications

## 2015-07-06 ENCOUNTER — Encounter: Payer: Self-pay | Admitting: Anesthesiology

## 2015-07-06 ENCOUNTER — Encounter (HOSPITAL_BASED_OUTPATIENT_CLINIC_OR_DEPARTMENT_OTHER)
Admission: RE | Admit: 2015-07-06 | Discharge: 2015-07-06 | Disposition: A | Discharge status: Home / Self Care | Payer: 59 | Source: Ambulatory Visit | Attending: Psychiatry | Admitting: Psychiatry

## 2015-07-06 DIAGNOSIS — F332 Major depressive disorder, recurrent severe without psychotic features: Secondary | ICD-10-CM | POA: Diagnosis not present

## 2015-07-06 MED ORDER — SUCCINYLCHOLINE 20MG/ML (10ML) SYRINGE FOR MEDFUSION PUMP - OPTIME
INTRAMUSCULAR | Status: DC | PRN
  Administered 2015-07-06: 150 mg via INTRAVENOUS

## 2015-07-06 MED ORDER — GLYCOPYRROLATE 0.2 MG/ML IJ SOLN
0.4000 mg | Freq: Once | INTRAMUSCULAR | Status: AC
  Administered 2015-07-06: 0.4 mg via INTRAVENOUS

## 2015-07-06 MED ORDER — METHOHEXITAL SODIUM 100 MG/10ML IV SOSY
PREFILLED_SYRINGE | INTRAVENOUS | Status: DC | PRN
  Administered 2015-07-06: 90 mg via INTRAVENOUS

## 2015-07-06 MED ORDER — SODIUM CHLORIDE 0.9 % IV SOLN
250.0000 mL | Freq: Once | INTRAVENOUS | Status: AC
  Administered 2015-07-06: 11:00:00 via INTRAVENOUS

## 2015-07-06 MED ORDER — SODIUM CHLORIDE 0.9 % IV SOLN: Freq: Once | INTRAVENOUS | Status: DC

## 2015-07-06 NOTE — Anesthesia Postprocedure Evaluation (Signed)
Anesthesia Post Note  Patient: Brittany [name]  Procedure(s) Performed: * No procedures listed *  Patient location during evaluation: PACU Anesthesia Type: General Level of consciousness: awake and alert Pain management: pain level controlled Vital Signs Assessment: post-procedure vital signs reviewed and stable Respiratory status: spontaneous breathing, nonlabored ventilation, respiratory function stable and patient connected to nasal cannula oxygen Cardiovascular status: blood pressure returned to baseline and stable Postop Assessment: no signs of nausea or vomiting Anesthetic complications: no    Last Vitals:  Filed Vitals:   07/06/15 1103 07/06/15 1105  BP: 109/94   Pulse: 93 114  Temp: 37.2 C   Resp: 27 21    Last Pain: There were no vitals filed for this visit.               Molli Barrows

## 2015-07-06 NOTE — Anesthesia Preprocedure Evaluation (Addendum)
Anesthesia Evaluation  Patient identified by MRN, date of birth, ID band Patient awake    Reviewed: Allergy & Precautions, H&P , NPO status , Patient's Chart, lab work & pertinent test results, reviewed documented beta blocker date and time   History of Anesthesia Complications Negative for: history of anesthetic complications  Airway Mallampati: II   Neck ROM: full    Dental  (+) Poor Dentition   Pulmonary neg pulmonary ROS, sleep apnea , former smoker,    Pulmonary exam normal        Cardiovascular hypertension, negative cardio ROS Normal cardiovascular exam Rhythm:Regular     Neuro/Psych  Headaches, PSYCHIATRIC DISORDERS Anxiety Depression negative neurological ROS  negative psych ROS   GI/Hepatic negative GI ROS, Neg liver ROS, hiatal hernia, GERD  ,  Endo/Other  negative endocrine ROS  Renal/GU negative Renal ROS  negative genitourinary   Musculoskeletal   Abdominal   Peds  Hematology negative hematology ROS (+) anemia ,   Anesthesia Other Findings Past Medical History:   Migraine                                                     Hypertension                                                 Hypercholesterolemia                                         Depression                                                   Morbid obesity (HCC)                                         Sleep apnea                                                    Comment:no CPAP machine    H/O hiatal hernia                                            GERD (gastroesophageal reflux disease)                         Comment:hx of    Arthritis  Comment:hips, knees and hands    Anxiety                                                      PTSD (post-traumatic stress disorder)                      Past Surgical History:   FOOT SURGERY                                                  BREATH  TEK H PYLORI                              05/03/2012      Comment:Procedure: BREATH TEK H PYLORI;  Surgeon: [name], MD;  Location: [facility];                Service: General;  Laterality: N/A;   right tube and ovary removed                                  ABDOMINAL HYSTERECTOMY                           1997 & 20*   COLONOSCOPY                                                     Comment:hx of benign polyps    GASTRIC ROUX-EN-Y                               N/A 07/05/2012      Comment:Procedure: LAPAROSCOPIC ROUX-EN-Y GASTRIC;                Surgeon: [name], MD;  Location: [facility];              Service: General;  Laterality: N/A;   UPPER GI ENDOSCOPY                              N/A 07/05/2012      Comment:Procedure: UPPER GI ENDOSCOPY;  Surgeon: [name], MD;  Location: [facility];  Service:               General;  Laterality: N/A;   LAPAROSCOPIC LYSIS OF ADHESIONS                 N/A 07/05/2012      Comment:Procedure: LAPAROSCOPIC LYSIS OF ADHESIONS;                Surgeon: [name], MD;  Location: [facility];  Service: General;  Laterality: N/A;  repair of               abdominal wall hernia   IRRIGATION AND DEBRIDEMENT ABSCESS              N/A 01/01/2014      Comment:Procedure: IRRIGATION AND DEBRIDEMENT ABSCESS;               Surgeon: [name], MD;  Location: [facility];  Service: General;  Laterality: N/A;   GASTRIC BYPASS                                              BMI    Body Mass Index   31.99 kg/m 2     Reproductive/Obstetrics                            Anesthesia Physical Anesthesia Plan  ASA: III  Anesthesia Plan: General   Post-op Pain Management:    Induction: Intravenous  Airway Management Planned:   Additional Equipment:   Intra-op Plan:   Post-operative Plan:   Informed Consent: I have reviewed the patients History and Physical, chart, labs and  discussed the procedure including the risks, benefits and alternatives for the proposed anesthesia with the patient or authorized representative who has indicated his/her understanding and acceptance.     Plan Discussed with:   Anesthesia Plan Comments:         Anesthesia Quick Evaluation

## 2015-07-06 NOTE — Discharge Instructions (Signed)
1)  The drugs that you have been given will stay in your system until tomorrow so for the       next 24 hours you should not:  A. Drive an automobile  B. Make any legal decisions  C. Drink any alcoholic beverages  2)  You may resume your regular meals upon return home.  3)  A responsible adult must take you home.  Someone should stay with you for a few          hours, then be available by phone for the remainder of the treatment day.  4)  You May experience any of the following symptoms:  Headache, Nausea and a dry mouth (due to the medications you were given),  temporary memory loss and some confusion, or sore muscles (a warm bath  should help this).  If you you experience any of these symptoms let us know on                your return visit.  5)  Report any of the following: any acute discomfort, severe headache, or temperature        greater than 100.5 F.   Also report any unusual redness, swelling, drainage, or pain         at your IV site.    You may report Symptoms to:  [place] [facility]          Phone: [phone], ECT Department           or Dr. Prescott Gum office [phone]  6)  Your next ECT Treatment is Day Monday  Date April 3   We will call 2 days prior to your scheduled appointment for arrival times.  7)  Nothing to eat or drink after midnight the night before your procedure.  8)  Take      With a sip of water the morning of your procedure.  9)  Other Instructions: Call [phone] to cancel the morning of your procedure due         to illness or emergency.  10) We will call within 72 hours to assess how you are feeling.

## 2015-07-06 NOTE — Progress Notes (Signed)
Dr Andree Elk aware of b/p 90/45. Orders for NS 250 ml bolus started.

## 2015-07-06 NOTE — Procedures (Signed)
ECT SERVICES Physician's Interval Evaluation & Treatment Note  Patient Identification: Brittany [name] MRN:  [id number] Date of Evaluation:  07/06/2015 TX #: [id number]  MADRS: 16  MMSE: 30  P.E. Findings:  Physical exam stable vital stable. No change to exam.  Psychiatric Interval Note:  Mood slightly better although she still has some subjective depression and slight suicidal thoughts although she is very confident of no intent or plan and there is no evidence of psychosis.  Subjective:  Patient is a 50 y.o. female seen for evaluation for Electroconvulsive Therapy. No subjective complaint.  Treatment Summary:   [x]   Right Unilateral             []  Bilateral   % Energy : 0.3 ms 90%   Impedance: 630 ohms  Seizure Energy Index: 12,269 V squared  Postictal Suppression Index: 95%  Seizure Concordance Index: 97%  Medications  Pre Shock: Robinul 0.4 mg, Brevital 90 mg, succinylcholine 150 mg  Post Shock:    Seizure Duration: 37 seconds by EMG, and 60 seconds by EEG   Comments: Follow-up April 3. Patient will contact her primary psychiatrist if there is an emergency in the meantime. Continue current medicine plan otherwise.   Lungs:  [x]   Clear to auscultation               []  Other:   Heart:    [x]   Regular rhythm             []  irregular rhythm    [x]   Previous H&P reviewed, patient examined and there are NO CHANGES                 []   Previous H&P reviewed, patient examined and there are changes noted.   [name], MD 3/24/201710:44 AM

## 2015-07-06 NOTE — Transfer of Care (Signed)
Immediate Anesthesia Transfer of Care Note  Patient: Brittany [name]  Procedure(s) Performed: ECT  Patient Location: PACU  Anesthesia Type:General  Level of Consciousness: sedated  Airway & Oxygen Therapy: Patient Spontanous Breathing and Patient connected to face mask oxygen  Post-op Assessment: Report given to RN  Post vital signs: Reviewed and stable  Last Vitals:  Filed Vitals:   07/06/15 0828 07/06/15 1105  BP: 135/90 109/94  Pulse: 66 114  Temp: 36.3 C 51F  Resp: 16 21    Complications: No apparent anesthesia complications

## 2015-07-06 NOTE — H&P (Signed)
Brittany [name] is an 50 y.o. female.   Chief Complaint: Patient is still feeling depressed although she subjectively feels like things are slightly better. Minimal memory problems HPI: Patient currently has clear lungs normal heart blood pressure pulse normal.  Past Medical History  Diagnosis Date  . Migraine   . Hypertension   . Hypercholesterolemia   . Depression   . Morbid obesity (Brittany [name])   . Sleep apnea     no CPAP machine   . H/O hiatal hernia   . GERD (gastroesophageal reflux disease)     hx of   . Arthritis     hips, knees and hands   . Anxiety   . PTSD (post-traumatic stress disorder)     Past Surgical History  Procedure Laterality Date  . Foot surgery    . Breath tek h pylori  05/03/2012    Procedure: BREATH TEK H PYLORI;  Surgeon: [name], MD;  Location: [name];  Service: General;  Laterality: N/A;  . Right tube and ovary removed     . Abdominal hysterectomy  1997 & 2006  . Colonoscopy      hx of benign polyps   . 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 Upper gi endoscopy N/A 07/05/2012    Procedure: UPPER GI ENDOSCOPY;  Surgeon: [name], MD;  Location: [facility];  Service: General;  Laterality: N/A;  . Laparoscopic lysis of adhesions N/A 07/05/2012    Procedure: LAPAROSCOPIC LYSIS OF ADHESIONS;  Surgeon: [name], MD;  Location: [facility];  Service: General;  Laterality: N/A;  repair of abdominal wall hernia  . Irrigation and debridement abscess N/A 01/01/2014    Procedure: IRRIGATION AND DEBRIDEMENT ABSCESS;  Surgeon: [name], MD;  Location: [facility];  Service: General;  Laterality: N/A;  . Gastric bypass      Family History  Problem Relation Age of Onset  . Cancer Mother     colon  . Cancer Maternal Aunt     breast  . Depression Father   . Depression Sister   . Post-traumatic stress disorder Sister   .  Alcohol abuse Paternal Uncle   . Bipolar disorder Cousin   . Anxiety disorder Sister    Social History:  reports that she quit smoking about 11 years ago. Her smoking use included Cigars. She has never used smokeless tobacco. She reports that she does not drink alcohol or use illicit drugs.  Allergies:  Allergies  Allergen Reactions  . Amoxicillin Hives  . Bactrim [Sulfamethoxazole-Trimethoprim] Hives and Rash  . Lamictal [Lamotrigine] Hives and Rash     (Not in a hospital admission)  No results found for this or any previous visit (from the past 48 hour(s)). No results found.  Review of Systems  Constitutional: Negative.   HENT: Negative.   Eyes: Negative.   Respiratory: Negative.   Cardiovascular: Negative.   Gastrointestinal: Negative.   Musculoskeletal: Negative.   Skin: Negative.   Neurological: Negative.   Psychiatric/Behavioral: Positive for depression and suicidal ideas. Negative for hallucinations, memory loss and substance abuse. The patient is nervous/anxious. The patient does not have insomnia.     Blood pressure 135/90, pulse 66, temperature 97.3 F (36.3 C), temperature source Tympanic, resp. rate 16, height 5\' 10"  (1.778 m), weight 102.513 kg (226 lb), SpO2 100 %. Physical Exam  Nursing note and vitals reviewed.  Constitutional: She appears well-developed and well-nourished.  HENT:  Head: Normocephalic and atraumatic.  Eyes: Conjunctivae are normal. Pupils are equal, round, and reactive to light.  Neck: Normal range of motion.  Cardiovascular: Normal rate, regular rhythm and normal heart sounds.   Respiratory: Effort normal and breath sounds normal. No respiratory distress.  GI: Soft.  Musculoskeletal: Normal range of motion.  Neurological: She is alert.  Skin: Skin is warm and dry.  Psychiatric: Her speech is normal. Judgment normal. She is withdrawn. Cognition and memory are normal. She exhibits a depressed mood. She expresses suicidal ideation. She  expresses no suicidal plans.  Patient has passive suicidal thoughts without any active intent or plan     Assessment/Plan Improving and does not need hospital level treatment but does require further ECT. She is aware that I will be away next week and so we are scheduled for a next treatment on April 3.  Brittany Berthold, MD 07/06/2015, 10:42 AM

## 2015-07-16 ENCOUNTER — Encounter
Admission: RE | Admit: 2015-07-16 | Discharge: 2015-07-16 | Disposition: A | Discharge status: Home / Self Care | Payer: 59 | Source: Ambulatory Visit | Attending: Psychiatry | Admitting: Psychiatry

## 2015-07-16 ENCOUNTER — Encounter: Payer: Self-pay | Admitting: Anesthesiology

## 2015-07-16 DIAGNOSIS — K219 Gastro-esophageal reflux disease without esophagitis: Secondary | ICD-10-CM | POA: Insufficient documentation

## 2015-07-16 DIAGNOSIS — Z800 Family history of malignant neoplasm of digestive organs: Secondary | ICD-10-CM | POA: Diagnosis not present

## 2015-07-16 DIAGNOSIS — G4730 Sleep apnea, unspecified: Secondary | ICD-10-CM | POA: Insufficient documentation

## 2015-07-16 DIAGNOSIS — Z803 Family history of malignant neoplasm of breast: Secondary | ICD-10-CM | POA: Insufficient documentation

## 2015-07-16 DIAGNOSIS — M19049 Primary osteoarthritis, unspecified hand: Secondary | ICD-10-CM | POA: Diagnosis not present

## 2015-07-16 DIAGNOSIS — Z880 Allergy status to penicillin: Secondary | ICD-10-CM | POA: Insufficient documentation

## 2015-07-16 DIAGNOSIS — Z811 Family history of alcohol abuse and dependence: Secondary | ICD-10-CM | POA: Diagnosis not present

## 2015-07-16 DIAGNOSIS — Z90710 Acquired absence of both cervix and uterus: Secondary | ICD-10-CM | POA: Diagnosis not present

## 2015-07-16 DIAGNOSIS — M1610 Unilateral primary osteoarthritis, unspecified hip: Secondary | ICD-10-CM | POA: Insufficient documentation

## 2015-07-16 DIAGNOSIS — F332 Major depressive disorder, recurrent severe without psychotic features: Secondary | ICD-10-CM | POA: Diagnosis not present

## 2015-07-16 DIAGNOSIS — Z881 Allergy status to other antibiotic agents status: Secondary | ICD-10-CM | POA: Diagnosis not present

## 2015-07-16 DIAGNOSIS — I10 Essential (primary) hypertension: Secondary | ICD-10-CM | POA: Diagnosis not present

## 2015-07-16 DIAGNOSIS — E7800 Pure hypercholesterolemia, unspecified: Secondary | ICD-10-CM | POA: Insufficient documentation

## 2015-07-16 DIAGNOSIS — G43909 Migraine, unspecified, not intractable, without status migrainosus: Secondary | ICD-10-CM | POA: Diagnosis not present

## 2015-07-16 DIAGNOSIS — M179 Osteoarthritis of knee, unspecified: Secondary | ICD-10-CM | POA: Insufficient documentation

## 2015-07-16 DIAGNOSIS — Z888 Allergy status to other drugs, medicaments and biological substances status: Secondary | ICD-10-CM | POA: Diagnosis not present

## 2015-07-16 DIAGNOSIS — Z9889 Other specified postprocedural states: Secondary | ICD-10-CM | POA: Diagnosis not present

## 2015-07-16 DIAGNOSIS — Z818 Family history of other mental and behavioral disorders: Secondary | ICD-10-CM | POA: Diagnosis not present

## 2015-07-16 MED ORDER — METHOHEXITAL SODIUM 100 MG/10ML IV SOSY
PREFILLED_SYRINGE | INTRAVENOUS | Status: DC | PRN
  Administered 2015-07-16: 90 mg via INTRAVENOUS

## 2015-07-16 MED ORDER — SODIUM CHLORIDE 0.9 % IV SOLN
INTRAVENOUS | Status: DC | PRN
  Administered 2015-07-16: 10:00:00 via INTRAVENOUS

## 2015-07-16 MED ORDER — GLYCOPYRROLATE 0.2 MG/ML IJ SOLN
0.4000 mg | Freq: Once | INTRAMUSCULAR | Status: AC
  Administered 2015-07-16: 0.4 mg via INTRAVENOUS

## 2015-07-16 MED ORDER — GLYCOPYRROLATE 0.2 MG/ML IJ SOLN
INTRAMUSCULAR | Status: AC
  Administered 2015-07-16: 0.4 mg via INTRAVENOUS
  Filled 2015-07-16: qty 2

## 2015-07-16 MED ORDER — SUCCINYLCHOLINE 20MG/ML (10ML) SYRINGE FOR MEDFUSION PUMP - OPTIME
INTRAMUSCULAR | Status: DC | PRN
  Administered 2015-07-16: 150 mg via INTRAVENOUS

## 2015-07-16 MED ORDER — SODIUM CHLORIDE 0.9 % IV SOLN
250.0000 mL | Freq: Once | INTRAVENOUS | Status: AC
  Administered 2015-07-16: 500 mL via INTRAVENOUS

## 2015-07-16 NOTE — Anesthesia Procedure Notes (Signed)
Date/Time: 07/16/2015 10:14 AM Performed by: Dionne Bucy Pre-anesthesia Checklist: Patient identified, Emergency Drugs available, Suction available and Patient being monitored Patient Re-evaluated:Patient Re-evaluated prior to inductionOxygen Delivery Method: Circle system utilized Preoxygenation: Pre-oxygenation with 100% oxygen Intubation Type: IV induction Ventilation: Mask ventilation without difficulty and Mask ventilation throughout procedure Airway Equipment and Method: Bite block Placement Confirmation: positive ETCO2 Dental Injury: Teeth and Oropharynx as per pre-operative assessment

## 2015-07-16 NOTE — H&P (Signed)
Brittany [name] is an 50 y.o. female.   Chief Complaint: Mood has been improved over the last couple weeks. The last week has felt particularly good. Functioning well at home and work. No depression no return of suicidal thinking. 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 Recent relapse now improving  Past Medical History  Diagnosis Date  . Migraine   . Hypertension   . Hypercholesterolemia   . Depression   . Morbid obesity ([place])   . Sleep apnea     no CPAP machine   . H/O hiatal hernia   . GERD (gastroesophageal reflux disease)     hx of   . Arthritis     hips, knees and hands   . Anxiety   . PTSD (post-traumatic stress disorder)     Past Surgical History  Procedure Laterality Date  . Foot surgery    . Breath tek h pylori  05/03/2012    Procedure: BREATH TEK H PYLORI;  Surgeon: [name], MD;  Location: [name];  Service: General;  Laterality: N/A;  . Right tube and ovary removed     . Abdominal hysterectomy  1997 & 2006  . Colonoscopy      hx of benign polyps   . 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 Upper gi endoscopy N/A 07/05/2012    Procedure: UPPER GI ENDOSCOPY;  Surgeon: [name], MD;  Location: [facility];  Service: General;  Laterality: N/A;  . Laparoscopic lysis of adhesions N/A 07/05/2012    Procedure: LAPAROSCOPIC LYSIS OF ADHESIONS;  Surgeon: [name], MD;  Location: [facility];  Service: General;  Laterality: N/A;  repair of abdominal wall hernia  . Irrigation and debridement abscess N/A 01/01/2014    Procedure: IRRIGATION AND DEBRIDEMENT ABSCESS;  Surgeon: [name], MD;  Location: [facility];  Service: General;  Laterality: N/A;  . Gastric bypass      Family History  Problem Relation Age of Onset  . Cancer Mother     colon  . Cancer Maternal Aunt     breast  . Depression Father   . Depression  Sister   . Post-traumatic stress disorder Sister   . Alcohol abuse Paternal Uncle   . Bipolar disorder Cousin   . Anxiety disorder Sister    Social History:  reports that she quit smoking about 11 years ago. Her smoking use included Cigars. She has never used smokeless tobacco. She reports that she does not drink alcohol or use illicit drugs.  Allergies:  Allergies  Allergen Reactions  . Amoxicillin Hives  . Bactrim [Sulfamethoxazole-Trimethoprim] Hives and Rash  . Lamictal [Lamotrigine] Hives and Rash     (Not in a hospital admission)  No results found for this or any previous visit (from the past 48 hour(s)). No results found.  Review of Systems  Constitutional: Negative.   HENT: Negative.   Eyes: Negative.   Respiratory: Negative.   Cardiovascular: Negative.   Gastrointestinal: Negative.   Musculoskeletal: Negative.   Skin: Negative.   Neurological: Negative.   Psychiatric/Behavioral: Negative for depression, suicidal ideas, hallucinations, memory loss and substance abuse. The patient is not nervous/anxious and does not have insomnia.     Blood pressure 133/82, pulse 76, temperature 97.2 F (36.2 C), temperature source Oral, resp. rate 18, height 5\' 10"  (1.778 m), weight 102.967 kg (227 lb), SpO2  99 %. Physical Exam  Nursing note and vitals reviewed. Constitutional: She appears well-developed and well-nourished.  HENT:  Head: Normocephalic and atraumatic.  Eyes: Conjunctivae are normal. Pupils are equal, round, and reactive to light.  Neck: Normal range of motion.  Cardiovascular: Normal rate, regular rhythm and normal heart sounds.   Respiratory: Effort normal and breath sounds normal. No respiratory distress.  GI: Soft.  Musculoskeletal: Normal range of motion.  Neurological: She is alert.  Skin: Skin is warm and dry.  Psychiatric: She has a normal mood and affect. Her behavior is normal. Judgment and thought content normal.     Assessment/Plan Treatment  today next scheduled to be seen in about 2 weeks most likely on April 17  [name], MD 07/16/2015, 10:04 AM

## 2015-07-16 NOTE — Discharge Instructions (Signed)
1)  The drugs that you have been given will stay in your system until tomorrow so for the       next 24 hours you should not:  A. Drive an automobile  B. Make any legal decisions  C. Drink any alcoholic beverages  2)  You may resume your regular meals upon return home.  3)  A responsible adult must take you home.  Someone should stay with you for a few          hours, then be available by phone for the remainder of the treatment day.  4)  You May experience any of the following symptoms:  Headache, Nausea and a dry mouth (due to the medications you were given),  temporary memory loss and some confusion, or sore muscles (a warm bath  should help this).  If you you experience any of these symptoms let us know on                your return visit.  5)  Report any of the following: any acute discomfort, severe headache, or temperature        greater than 100.5 F.   Also report any unusual redness, swelling, drainage, or pain         at your IV site.    You may report Symptoms to:  [place] [facility]          Phone: [phone], ECT Department           or Dr. Prescott Gum office [phone]  6)  Your next ECT Treatment is Monday July 30, 2015  We will call 2 days prior to your scheduled appointment for arrival times.  7)  Nothing to eat or drink after midnight the night before your procedure.  8)  Take      With a sip of water the morning of your procedure.  9)  Other Instructions: Call [phone] to cancel the morning of your procedure due         to illness or emergency.  10) We will call within 72 hours to assess how you are feeling.

## 2015-07-16 NOTE — Anesthesia Postprocedure Evaluation (Signed)
Anesthesia Post Note  Patient: Brittany [name]  Procedure(s) Performed: * No procedures listed *  Patient location during evaluation: PACU Anesthesia Type: General Level of consciousness: awake and alert Pain management: pain level controlled Vital Signs Assessment: post-procedure vital signs reviewed and stable Respiratory status: spontaneous breathing, nonlabored ventilation, respiratory function stable and patient connected to nasal cannula oxygen Cardiovascular status: blood pressure returned to baseline and stable Postop Assessment: no signs of nausea or vomiting Anesthetic complications: no    Last Vitals:  Filed Vitals:   07/16/15 1100 07/16/15 1111  BP:  108/71  Pulse: 88   Temp:    Resp: 16     Last Pain:  Filed Vitals:   07/16/15 1112  PainSc: 0-No pain                 Martha Clan

## 2015-07-16 NOTE — Anesthesia Preprocedure Evaluation (Signed)
Anesthesia Evaluation  Patient identified by MRN, date of birth, ID band Patient awake    Reviewed: Allergy & Precautions, H&P , NPO status , Patient's Chart, lab work & pertinent test results, reviewed documented beta blocker date and time   History of Anesthesia Complications Negative for: history of anesthetic complications  Airway Mallampati: II   Neck ROM: full    Dental  (+) Poor Dentition   Pulmonary neg pulmonary ROS, sleep apnea , former smoker,    Pulmonary exam normal        Cardiovascular hypertension, negative cardio ROS Normal cardiovascular exam Rhythm:Regular     Neuro/Psych  Headaches, PSYCHIATRIC DISORDERS negative neurological ROS  negative psych ROS   GI/Hepatic negative GI ROS, Neg liver ROS, hiatal hernia, GERD  ,  Endo/Other  negative endocrine ROS  Renal/GU negative Renal ROS  negative genitourinary   Musculoskeletal   Abdominal   Peds  Hematology negative hematology ROS (+) anemia ,   Anesthesia Other Findings Past Medical History:   Migraine                                                     Hypertension                                                 Hypercholesterolemia                                         Depression                                                   Morbid obesity (HCC)                                         Sleep apnea                                                    Comment:no CPAP machine    H/O hiatal hernia                                            GERD (gastroesophageal reflux disease)                         Comment:hx of    Arthritis  Comment:hips, knees and hands    Anxiety                                                      PTSD (post-traumatic stress disorder)                      Past Surgical History:   FOOT SURGERY                                                  BREATH TEK H PYLORI                               05/03/2012      Comment:Procedure: BREATH TEK H PYLORI;  Surgeon: [name], MD;  Location: [facility];                Service: General;  Laterality: N/A;   right tube and ovary removed                                  ABDOMINAL HYSTERECTOMY                           1997 & 20*   COLONOSCOPY                                                     Comment:hx of benign polyps    GASTRIC ROUX-EN-Y                               N/A 07/05/2012      Comment:Procedure: LAPAROSCOPIC ROUX-EN-Y GASTRIC;                Surgeon: [name], MD;  Location: [facility];              Service: General;  Laterality: N/A;   UPPER GI ENDOSCOPY                              N/A 07/05/2012      Comment:Procedure: UPPER GI ENDOSCOPY;  Surgeon: [name], MD;  Location: [facility];  Service:               General;  Laterality: N/A;   LAPAROSCOPIC LYSIS OF ADHESIONS                 N/A 07/05/2012      Comment:Procedure: LAPAROSCOPIC LYSIS OF ADHESIONS;                Surgeon: [name], MD;  Location: [facility];  Service: General;  Laterality: N/A;  repair of               abdominal wall hernia   IRRIGATION AND DEBRIDEMENT ABSCESS              N/A 01/01/2014      Comment:Procedure: IRRIGATION AND DEBRIDEMENT ABSCESS;               Surgeon: [name], MD;  Location: [facility];  Service: General;  Laterality: N/A;   GASTRIC BYPASS                                              BMI    Body Mass Index   31.99 kg/m 2     Reproductive/Obstetrics                             Anesthesia Physical  Anesthesia Plan  ASA: III  Anesthesia Plan: General   Post-op Pain Management:    Induction: Intravenous  Airway Management Planned:   Additional Equipment:   Intra-op Plan:   Post-operative Plan:   Informed Consent: I have reviewed the patients History and Physical, chart, labs and discussed the procedure  including the risks, benefits and alternatives for the proposed anesthesia with the patient or authorized representative who has indicated his/her understanding and acceptance.     Plan Discussed with:   Anesthesia Plan Comments:         Anesthesia Quick Evaluation

## 2015-07-16 NOTE — Procedures (Signed)
ECT SERVICES Physician's Interval Evaluation & Treatment Note  Patient Identification: Brittany [name] MRN:  [id number] Date of Evaluation:  07/16/2015 TX #: [id number]  MADRS:   MMSE:   P.E. Findings:  No change to physical exam. No new complaints. Lungs clear heart normal. Vitals evaluated all normal.  Psychiatric Interval Note:  Mood is feeling much improved. No subjective depression no suicidal thoughts.  Subjective:  Patient is a 50 y.o. female seen for evaluation for Electroconvulsive Therapy. No specific new complaint  Treatment Summary:   [x]   Right Unilateral             []  Bilateral   % Energy : 0.3 ms, 90%   Impedance: 1450 ohms  Seizure Energy Index: 16,852 V squared  Postictal Suppression Index: 91%  Seizure Concordance Index: 99%  Medications  Pre Shock: Robinul 0.4 mg, Brevital 90 mg, succinylcholine 150 mg  Post Shock:    Seizure Duration: 36 seconds by EMG, 74 seconds by EEG   Comments: Plan follow-up in 2 weeks on April 17   Lungs:  [x]   Clear to auscultation               []  Other:   Heart:    [x]   Regular rhythm             []  irregular rhythm    [x]   Previous H&P reviewed, patient examined and there are NO CHANGES                 []   Previous H&P reviewed, patient examined and there are changes noted.   [name], MD 4/3/201710:06 AM

## 2015-07-16 NOTE — Transfer of Care (Signed)
Immediate Anesthesia Transfer of Care Note  Patient: Brittany [name]  Procedure(s) Performed: ECT  Patient Location: PACU  Anesthesia Type:General  Level of Consciousness: awake and confused  Airway & Oxygen Therapy: Patient Spontanous Breathing and Patient connected to face mask oxygen  Post-op Assessment: Report given to RN  Post vital signs: Reviewed and stable  Last Vitals:  Filed Vitals:   07/16/15 0825 07/16/15 1028  BP: 133/82 119/71  Pulse: 76 91  Temp: 36.2 C 97.26F  Resp: 18 10    Complications: No apparent anesthesia complications

## 2015-07-23 ENCOUNTER — Telehealth: Payer: Self-pay | Admitting: *Deleted

## 2015-07-27 ENCOUNTER — Other Ambulatory Visit: Payer: Self-pay | Admitting: Psychiatry

## 2015-07-30 ENCOUNTER — Encounter
Admission: RE | Admit: 2015-07-30 | Discharge: 2015-07-30 | Disposition: A | Discharge status: Home / Self Care | Payer: 59 | Source: Ambulatory Visit | Attending: Psychiatry | Admitting: Psychiatry

## 2015-07-30 ENCOUNTER — Encounter: Payer: Self-pay | Admitting: Anesthesiology

## 2015-07-30 DIAGNOSIS — F332 Major depressive disorder, recurrent severe without psychotic features: Secondary | ICD-10-CM | POA: Diagnosis not present

## 2015-07-30 MED ORDER — GLYCOPYRROLATE 0.2 MG/ML IJ SOLN
INTRAMUSCULAR | Status: AC
  Filled 2015-07-30: qty 2

## 2015-07-30 MED ORDER — SODIUM CHLORIDE 0.9 % IV SOLN
250.0000 mL | Freq: Once | INTRAVENOUS | Status: AC
  Administered 2015-07-30: 500 mL via INTRAVENOUS

## 2015-07-30 MED ORDER — GLYCOPYRROLATE 0.2 MG/ML IJ SOLN
0.4000 mg | Freq: Once | INTRAMUSCULAR | Status: AC
  Administered 2015-07-30: 0.4 mg via INTRAVENOUS

## 2015-07-30 NOTE — Progress Notes (Signed)
Patient ID: Brittany [name], female   DOB: [birth year], 50 y.o.   MRN: [id number] ECT: Patient had been scheduled for ECT treatment today but on interview this morning she reports that her mood is doing quite well but she has continued to have concerns about significant memory impairment. Based on this it seems more prudent to cancel today's treatment and follow-up in 1 week instead. Patient is agreeable to the plan.

## 2015-07-30 NOTE — Discharge Summary (Signed)
0955 Dr Weber Cooks in to see pt.  Talked to pt and husband.  Decision made to cancel ECT today due to increased memory loss.  Pt rescheduled for next Monday.  Informed pt we would call Friday with appointment time on Monday April 24th.  Pt verbalized understanding of preprocedure inst given.

## 2015-07-30 NOTE — Anesthesia Preprocedure Evaluation (Signed)
Anesthesia Evaluation  Patient identified by MRN, date of birth, ID band Patient awake    Reviewed: Allergy & Precautions, NPO status , Patient's Chart, lab work & pertinent test results, reviewed documented beta blocker date and time   Airway Mallampati: II  TM Distance: >3 FB     Dental  (+) Chipped   Pulmonary sleep apnea , former smoker,           Cardiovascular hypertension, Pt. on medications      Neuro/Psych  Headaches, PSYCHIATRIC DISORDERS Anxiety Depression    GI/Hepatic hiatal hernia, GERD  Controlled,  Endo/Other    Renal/GU      Musculoskeletal  (+) Arthritis ,   Abdominal   Peds  Hematology  (+) anemia ,   Anesthesia Other Findings   Reproductive/Obstetrics                             Anesthesia Physical Anesthesia Plan  ASA: II  Anesthesia Plan: General   Post-op Pain Management:    Induction: Intravenous  Airway Management Planned: Oral ETT  Additional Equipment:   Intra-op Plan:   Post-operative Plan:   Informed Consent: I have reviewed the patients History and Physical, chart, labs and discussed the procedure including the risks, benefits and alternatives for the proposed anesthesia with the patient or authorized representative who has indicated his/her understanding and acceptance.     Plan Discussed with: CRNA  Anesthesia Plan Comments:         Anesthesia Quick Evaluation

## 2015-08-01 ENCOUNTER — Telehealth (HOSPITAL_COMMUNITY): Payer: Self-pay

## 2015-08-01 NOTE — Telephone Encounter (Signed)
Wrote patient an out of work note and called her to let her know she can pick it up any time

## 2015-08-01 NOTE — Telephone Encounter (Signed)
I returned patient's phone call.  She had a blowup at work.  She is not sure what triggered but endorsed she was anxious and she got very upset with a coworker and left the work.  She admitted it was embarrassing but denies any suicidal thoughts or homicidal thought.  Otherwise she is reported that her depression is much better and ECT is working very well.  I recommended to take 3 days off from the work and if she still feel very anxious than we will consider see her early.  Discuss anytime having suicidal thoughts or homicidal thoughts then she need to call us or go to the local emergency room.

## 2015-08-01 NOTE — Telephone Encounter (Signed)
I called and spoke to patient - she said that she has been having a lot of anxiety lately. Patient stated that her depression is better since starting ECT, but the anxiety is worse. Patient is going to try to move up her appointment with you, it is currently set for 5/8. Patient says she is feeling a lot better know and has no suicidal or homicidal intentions

## 2015-08-03 ENCOUNTER — Telehealth: Payer: Self-pay | Admitting: *Deleted

## 2015-08-08 ENCOUNTER — Telehealth (HOSPITAL_COMMUNITY): Payer: Self-pay

## 2015-08-08 NOTE — Telephone Encounter (Signed)
Patient is calling, she states that her job gave her ADA paperwork for accommodations, but she is not sure what she needs. I told her to get together with her boss and discuss what they think would help. Patient states this is a job that can be done at home, I suggested she speak to management about cutting down her hours at the office and working some from home, patient states that her job performance is good but her anxiety is through the roof. Please review and advise if you have any suggestions for patient, Thank you.

## 2015-08-20 ENCOUNTER — Ambulatory Visit (HOSPITAL_COMMUNITY): Payer: Self-pay | Admitting: Psychiatry

## 2015-08-22 ENCOUNTER — Encounter (HOSPITAL_COMMUNITY): Payer: Self-pay | Admitting: Psychiatry

## 2015-08-22 ENCOUNTER — Ambulatory Visit (INDEPENDENT_AMBULATORY_CARE_PROVIDER_SITE_OTHER): Payer: 59 | Admitting: Psychiatry

## 2015-08-22 VITALS — BP 122/70 | HR 75 | Ht 70.0 in | Wt 226.6 lb

## 2015-08-22 DIAGNOSIS — F4310 Post-traumatic stress disorder, unspecified: Secondary | ICD-10-CM

## 2015-08-22 DIAGNOSIS — F331 Major depressive disorder, recurrent, moderate: Secondary | ICD-10-CM

## 2015-08-22 MED ORDER — QUETIAPINE FUMARATE 100 MG PO TABS: 100.0000 mg | ORAL_TABLET | Freq: Every day | ORAL | Status: DC

## 2015-08-22 MED ORDER — HYDROXYZINE PAMOATE 25 MG PO CAPS: 25.0000 mg | ORAL_CAPSULE | Freq: Every day | ORAL | Status: DC | PRN

## 2015-08-22 MED ORDER — DULOXETINE HCL 60 MG PO CPEP: 60.0000 mg | ORAL_CAPSULE | Freq: Two times a day (BID) | ORAL | Status: DC
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
## 2015-09-11 ENCOUNTER — Encounter (HOSPITAL_COMMUNITY): Payer: Self-pay | Admitting: Behavioral Health

## 2015-09-11 ENCOUNTER — Encounter (HOSPITAL_COMMUNITY): Payer: Self-pay | Admitting: Emergency Medicine

## 2015-09-11 ENCOUNTER — Emergency Department (HOSPITAL_COMMUNITY)
Admission: EM | Admit: 2015-09-11 | Discharge: 2015-09-11 | Disposition: A | Discharge status: Other Acute Inpatient Hospital | Payer: Managed Care, Other (non HMO) | Attending: Emergency Medicine | Admitting: Emergency Medicine

## 2015-09-11 ENCOUNTER — Inpatient Hospital Stay (HOSPITAL_COMMUNITY)
Admission: AD | Admit: 2015-09-11 | Discharge: 2015-09-17 | DRG: 885 | Disposition: A | Discharge status: Home / Self Care | Payer: 59 | Source: Intra-hospital | Attending: Psychiatry | Admitting: Psychiatry

## 2015-09-11 DIAGNOSIS — G44209 Tension-type headache, unspecified, not intractable: Secondary | ICD-10-CM | POA: Diagnosis present

## 2015-09-11 DIAGNOSIS — K219 Gastro-esophageal reflux disease without esophagitis: Secondary | ICD-10-CM | POA: Diagnosis present

## 2015-09-11 DIAGNOSIS — F332 Major depressive disorder, recurrent severe without psychotic features: Secondary | ICD-10-CM | POA: Insufficient documentation

## 2015-09-11 DIAGNOSIS — G4733 Obstructive sleep apnea (adult) (pediatric): Secondary | ICD-10-CM | POA: Diagnosis present

## 2015-09-11 DIAGNOSIS — Z9049 Acquired absence of other specified parts of digestive tract: Secondary | ICD-10-CM

## 2015-09-11 DIAGNOSIS — M1990 Unspecified osteoarthritis, unspecified site: Secondary | ICD-10-CM | POA: Diagnosis present

## 2015-09-11 DIAGNOSIS — Z818 Family history of other mental and behavioral disorders: Secondary | ICD-10-CM

## 2015-09-11 DIAGNOSIS — Z87891 Personal history of nicotine dependence: Secondary | ICD-10-CM | POA: Insufficient documentation

## 2015-09-11 DIAGNOSIS — F4310 Post-traumatic stress disorder, unspecified: Secondary | ICD-10-CM | POA: Diagnosis present

## 2015-09-11 DIAGNOSIS — Z9884 Bariatric surgery status: Secondary | ICD-10-CM

## 2015-09-11 DIAGNOSIS — F411 Generalized anxiety disorder: Secondary | ICD-10-CM | POA: Diagnosis present

## 2015-09-11 DIAGNOSIS — Z8249 Family history of ischemic heart disease and other diseases of the circulatory system: Secondary | ICD-10-CM

## 2015-09-11 DIAGNOSIS — G4700 Insomnia, unspecified: Secondary | ICD-10-CM | POA: Diagnosis present

## 2015-09-11 DIAGNOSIS — I10 Essential (primary) hypertension: Secondary | ICD-10-CM | POA: Diagnosis present

## 2015-09-11 DIAGNOSIS — Z809 Family history of malignant neoplasm, unspecified: Secondary | ICD-10-CM

## 2015-09-11 DIAGNOSIS — M179 Osteoarthritis of knee, unspecified: Secondary | ICD-10-CM | POA: Diagnosis not present

## 2015-09-11 DIAGNOSIS — E7800 Pure hypercholesterolemia, unspecified: Secondary | ICD-10-CM | POA: Diagnosis present

## 2015-09-11 DIAGNOSIS — F410 Panic disorder [episodic paroxysmal anxiety] without agoraphobia: Secondary | ICD-10-CM | POA: Diagnosis present

## 2015-09-11 DIAGNOSIS — E785 Hyperlipidemia, unspecified: Secondary | ICD-10-CM | POA: Diagnosis present

## 2015-09-11 DIAGNOSIS — M19049 Primary osteoarthritis, unspecified hand: Secondary | ICD-10-CM | POA: Diagnosis not present

## 2015-09-11 DIAGNOSIS — Z832 Family history of diseases of the blood and blood-forming organs and certain disorders involving the immune mechanism: Secondary | ICD-10-CM

## 2015-09-11 DIAGNOSIS — R45851 Suicidal ideations: Secondary | ICD-10-CM | POA: Diagnosis present

## 2015-09-11 DIAGNOSIS — M169 Osteoarthritis of hip, unspecified: Secondary | ICD-10-CM | POA: Insufficient documentation

## 2015-09-11 LAB — COMPREHENSIVE METABOLIC PANEL
ALBUMIN: 4.2 g/dL (ref 3.5–5.0)
ALK PHOS: 86 U/L (ref 38–126)
ALT: 18 U/L (ref 14–54)
AST: 15 U/L (ref 15–41)
Anion gap: 8 (ref 5–15)
BUN: 18 mg/dL (ref 6–20)
CHLORIDE: 106 mmol/L (ref 101–111)
CO2: 26 mmol/L (ref 22–32)
CREATININE: 0.63 mg/dL (ref 0.44–1.00)
Calcium: 9.2 mg/dL (ref 8.9–10.3)
GFR calc non Af Amer: >60 mL/min (ref >60)
GLUCOSE: 81 mg/dL (ref 65–99)
Potassium: 4 mmol/L (ref 3.5–5.1)
SODIUM: 140 mmol/L (ref 135–145)
Total Bilirubin: 0.5 mg/dL (ref 0.3–1.2)
Total Protein: 7.2 g/dL (ref 6.5–8.1)

## 2015-09-11 LAB — RAPID URINE DRUG SCREEN, HOSP PERFORMED
AMPHETAMINES: NOT DETECTED
BARBITURATES: NOT DETECTED
Benzodiazepines: NOT DETECTED
Cocaine: NOT DETECTED
Opiates: NOT DETECTED
TETRAHYDROCANNABINOL: NOT DETECTED

## 2015-09-11 LAB — CBC
HEMATOCRIT: 37.6 % (ref 36.0–46.0)
HEMOGLOBIN: 12.5 g/dL (ref 12.0–15.0)
MCH: 31.5 pg (ref 26.0–34.0)
MCHC: 33.2 g/dL (ref 30.0–36.0)
MCV: 94.7 fL (ref 78.0–100.0)
Platelets: 352 10*3/uL (ref 150–400)
RBC: 3.97 MIL/uL (ref 3.87–5.11)
RDW: 13.4 % (ref 11.5–15.5)
WBC: 4.7 10*3/uL (ref 4.0–10.5)

## 2015-09-11 LAB — ACETAMINOPHEN LEVEL: Acetaminophen (Tylenol), Serum: <10 ug/mL — ABNORMAL LOW (ref 10–30)

## 2015-09-11 LAB — ETHANOL: Alcohol, Ethyl (B): <5 mg/dL (ref <5)

## 2015-09-11 LAB — SALICYLATE LEVEL: Salicylate Lvl: <4 mg/dL (ref 2.8–30.0)

## 2015-09-11 MED ORDER — TOBRAMYCIN-DEXAMETHASONE 0.3-0.1 % OP SUSP
1.0000 [drp] | Freq: Four times a day (QID) | OPHTHALMIC | Status: DC
  Administered 2015-09-11 – 2015-09-17 (×22): 1 [drp] via OPHTHALMIC
  Filled 2015-09-11: qty 2.5

## 2015-09-11 MED ORDER — DULOXETINE HCL 60 MG PO CPEP
60.0000 mg | ORAL_CAPSULE | Freq: Two times a day (BID) | ORAL | Status: DC
Start: 2015-09-11 — End: 2015-09-18
  Administered 2015-09-11 – 2015-09-17 (×12): 60 mg via ORAL
  Filled 2015-09-11 (×17): qty 1

## 2015-09-11 MED ORDER — ALUM & MAG HYDROXIDE-SIMETH 200-200-20 MG/5ML PO SUSP: 30.0000 mL | ORAL | Status: DC | PRN

## 2015-09-11 MED ORDER — VITAMIN D3 25 MCG (1000 UNIT) PO TABS: 1000.0000 [IU] | ORAL_TABLET | Freq: Every day | ORAL | Status: DC

## 2015-09-11 MED ORDER — HYDROXYZINE HCL 25 MG PO TABS: 25.0000 mg | ORAL_TABLET | Freq: Every day | ORAL | Status: DC | PRN

## 2015-09-11 MED ORDER — DULOXETINE HCL 60 MG PO CPEP
60.0000 mg | ORAL_CAPSULE | Freq: Two times a day (BID) | ORAL | Status: DC
  Filled 2015-09-11: qty 1

## 2015-09-11 MED ORDER — QUETIAPINE FUMARATE 100 MG PO TABS: 100.0000 mg | ORAL_TABLET | Freq: Every day | ORAL | Status: DC

## 2015-09-11 MED ORDER — ACETAMINOPHEN 325 MG PO TABS: 650.0000 mg | ORAL_TABLET | Freq: Four times a day (QID) | ORAL | Status: DC | PRN

## 2015-09-11 MED ORDER — CYANOCOBALAMIN 500 MCG PO TABS
500.0000 ug | ORAL_TABLET | Freq: Every day | ORAL | Status: DC
  Administered 2015-09-12 – 2015-09-17 (×6): 500 ug via ORAL
  Filled 2015-09-11 (×9): qty 1

## 2015-09-11 MED ORDER — CALCIUM CARBONATE-VITAMIN D 500-200 MG-UNIT PO TABS
3.0000 | ORAL_TABLET | Freq: Every morning | ORAL | Status: DC
  Administered 2015-09-12 – 2015-09-17 (×6): 3 via ORAL
  Filled 2015-09-11 (×9): qty 3

## 2015-09-11 MED ORDER — VITAMIN D3 25 MCG (1000 UNIT) PO TABS
1000.0000 [IU] | ORAL_TABLET | Freq: Every day | ORAL | Status: DC
  Administered 2015-09-12 – 2015-09-17 (×6): 1000 [IU] via ORAL
  Filled 2015-09-11 (×10): qty 1

## 2015-09-11 MED ORDER — CALCIUM CARBONATE-VITAMIN D 500-200 MG-UNIT PO TABS: 3.0000 | ORAL_TABLET | Freq: Every morning | ORAL | Status: DC

## 2015-09-11 MED ORDER — ZOLPIDEM TARTRATE 5 MG PO TABS: 5.0000 mg | ORAL_TABLET | Freq: Every evening | ORAL | Status: DC | PRN

## 2015-09-11 MED ORDER — ADULT MULTIVITAMIN W/MINERALS CH: 1.0000 | ORAL_TABLET | Freq: Every day | ORAL | Status: DC

## 2015-09-11 MED ORDER — TOBRAMYCIN-DEXAMETHASONE 0.3-0.1 % OP SUSP
1.0000 [drp] | Freq: Four times a day (QID) | OPHTHALMIC | Status: DC
  Filled 2015-09-11: qty 2.5

## 2015-09-11 MED ORDER — ADULT MULTIVITAMIN W/MINERALS CH
1.0000 | ORAL_TABLET | Freq: Every day | ORAL | Status: DC
  Administered 2015-09-12 – 2015-09-17 (×6): 1 via ORAL
  Filled 2015-09-11 (×10): qty 1

## 2015-09-11 MED ORDER — ONDANSETRON HCL 4 MG PO TABS: 4.0000 mg | ORAL_TABLET | Freq: Three times a day (TID) | ORAL | Status: DC | PRN

## 2015-09-11 MED ORDER — IBUPROFEN 200 MG PO TABS: 600.0000 mg | ORAL_TABLET | Freq: Three times a day (TID) | ORAL | Status: DC | PRN

## 2015-09-11 MED ORDER — HYDROXYZINE PAMOATE 25 MG PO CAPS
25.0000 mg | ORAL_CAPSULE | Freq: Every day | ORAL | Status: DC | PRN
Start: 2015-09-11 — End: 2015-09-11
  Filled 2015-09-11: qty 1

## 2015-09-11 MED ORDER — LORATADINE 10 MG PO TABS
10.0000 mg | ORAL_TABLET | Freq: Every day | ORAL | Status: DC
  Administered 2015-09-12 – 2015-09-17 (×6): 10 mg via ORAL
  Filled 2015-09-11 (×10): qty 1

## 2015-09-11 MED ORDER — QUETIAPINE FUMARATE 100 MG PO TABS
100.0000 mg | ORAL_TABLET | Freq: Every day | ORAL | Status: DC
  Administered 2015-09-11: 100 mg via ORAL
  Filled 2015-09-11 (×3): qty 1

## 2015-09-11 MED ORDER — MAGNESIUM HYDROXIDE 400 MG/5ML PO SUSP: 30.0000 mL | Freq: Every day | ORAL | Status: DC | PRN

## 2015-09-11 MED ORDER — ACETAMINOPHEN 325 MG PO TABS: 650.0000 mg | ORAL_TABLET | ORAL | Status: DC | PRN

## 2015-09-11 MED ORDER — CYANOCOBALAMIN 500 MCG PO TABS: 500.0000 ug | ORAL_TABLET | Freq: Every day | ORAL | Status: DC

## 2015-09-11 MED ORDER — LORATADINE 10 MG PO TABS: 10.0000 mg | ORAL_TABLET | Freq: Every day | ORAL | Status: DC

## 2015-09-11 NOTE — ED Notes (Signed)
Pt discharged ambulatory with [place] driver.  All belongings were sent with patient.
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
## 2015-09-11 NOTE — Progress Notes (Signed)
Psychoeducational Group Note  Date:  09/11/2015 Time:  2100 Group Topic/Focus:  wrap up group  Participation Level: Did Not Attend  Participation Quality:  Not Applicable  Affect:  Not Applicable  Cognitive:  Not Applicable  Insight:  Not Applicable  Engagement in Group: Not Applicable  Additional Comments:  Pt was notified that group was beginning but remained in bed.   Shellia Cleverly 09/11/2015, 9:47 PM

## 2015-09-11 NOTE — ED Provider Notes (Signed)
CSN: [id number]     Arrival date & time 09/11/15  1056 History   First MD Initiated Contact with Patient 09/11/15 1105     Chief Complaint  Patient presents with  . Suicidal     (Consider location/radiation/quality/duration/timing/severity/associated sxs/prior Treatment) HPI Patient presents to the emergency department withSuicidal ideations.  The patient states that increasing depression and anxiety over the last few months and had a breaking point over the weekend where she was very verbally violent towards her mother-in-law who has dementia.  The patient states the husband left the house and called police due to the fact that she was so angry and upset.  The patient states that she has had suicidal thoughts previously, but these have been more significant.  Patient denies hallucinationsThe patient denies chest pain, shortness of breath, headache,blurred vision, neck pain, fever, cough, weakness, numbness, dizziness, anorexia, edema, abdominal pain, nausea, vomiting, diarrhea, rash, back pain, dysuria, hematemesis, bloody stool, near syncope, or syncope. Past Medical History  Diagnosis Date  . Migraine   . Hypertension   . Hypercholesterolemia   . Depression   . Morbid obesity ([place])   . Sleep apnea     no CPAP machine   . H/O hiatal hernia   . GERD (gastroesophageal reflux disease)     hx of   . Arthritis     hips, knees and hands   . Anxiety   . PTSD (post-traumatic stress disorder)    Past Surgical History  Procedure Laterality Date  . Foot surgery    . Breath tek h pylori  05/03/2012    Procedure: BREATH TEK H PYLORI;  Surgeon: [name], MD;  Location: [name];  Service: General;  Laterality: N/A;  . Right tube and ovary removed     . Abdominal hysterectomy  1997 & 2006  . Colonoscopy      hx of benign polyps   . Gastric roux-en-y N/A 07/05/2012    Procedure: LAPAROSCOPIC ROUX-EN-Y GASTRIC;  Surgeon: [name], MD;  [facility];  Service: General;   Laterality: N/A;  . Upper gi endoscopy N/A 07/05/2012    Procedure: UPPER GI ENDOSCOPY;  Surgeon: [name], MD;  Location: [facility];  Service: General;  Laterality: N/A;  . Laparoscopic lysis of adhesions N/A 07/05/2012    Procedure: LAPAROSCOPIC LYSIS OF ADHESIONS;  Surgeon: [name], MD;  Location: [facility];  Service: General;  Laterality: N/A;  repair of abdominal wall hernia  . Irrigation and debridement abscess N/A 01/01/2014    Procedure: IRRIGATION AND DEBRIDEMENT ABSCESS;  Surgeon: [name], MD;  Location: [facility];  Service: General;  Laterality: N/A;  . Gastric bypass     Family History  Problem Relation Age of Onset  . Cancer Mother     colon  . Cancer Maternal Aunt     breast  . Depression Father   . Depression Sister   . Post-traumatic stress disorder Sister   . Alcohol abuse Paternal Uncle   . Bipolar disorder Cousin   . Anxiety disorder Sister    Social History  Substance Use Topics  . Smoking status: Former Smoker    Types: Cigars    Quit date: 04/27/2004  . Smokeless tobacco: Never Used  . Alcohol Use: No     Comment: Occasional use   OB History    No data available     Review of Systems All other systems negative except as documented in the HPI. All pertinent positives and  negatives as reviewed in the HPI.   Allergies  Amoxicillin; Bactrim; and Lamictal  Home Medications   Prior to Admission medications   Medication Sig Start Date End Date Taking? Authorizing Provider  acetaminophen (TYLENOL) 500 MG tablet Take 1,000 mg by mouth every 6 (six) hours as needed for mild pain or headache.   Yes Historical Provider, MD  calcium-vitamin D (OSCAL WITH D) 500-200 MG-UNIT per tablet Take 3 tablets by mouth every morning.   Yes Historical Provider, MD  cholecalciferol (VITAMIN D) 1000 UNITS tablet Take 1 tablet (1,000 Units total) by mouth daily. 11/02/14  Yes Kerrie Buffalo, NP  DULoxetine (CYMBALTA) 60 MG capsule Take 1 capsule (60 mg total) by  mouth 2 (two) times daily. 08/22/15  Yes Kathlee Nations, MD  hydrOXYzine (VISTARIL) 25 MG capsule Take 1 capsule (25 mg total) by mouth daily as needed for anxiety. 08/22/15  Yes Kathlee Nations, MD  loratadine (CLARITIN) 10 MG tablet Take 1 tablet (10 mg total) by mouth daily. 11/02/14  Yes Kerrie Buffalo, NP  Multiple Vitamins-Minerals (MULTIVITAMIN WITH MINERALS) tablet Take 1 tablet by mouth daily.   Yes Historical Provider, MD  QUEtiapine (SEROQUEL) 100 MG tablet Take 1 tablet (100 mg total) by mouth at bedtime. 08/22/15  Yes Kathlee Nations, MD  tobramycin-dexamethasone [facility]) ophthalmic solution Place 1 drop into both eyes 4 (four) times daily. 09/07/15  Yes Historical Provider, MD  vitamin B-12 (CYANOCOBALAMIN) 500 MCG tablet Take 500 mcg by mouth daily.   Yes Historical Provider, MD   BP 140/84 mmHg  Pulse 66  Temp(Src) 97.7 F (36.5 C) (Oral)  Resp 16  SpO2 99% Physical Exam  Constitutional: She is oriented to person, place, and time. She appears well-developed and well-nourished. No distress.  HENT:  Head: Normocephalic and atraumatic.  Mouth/Throat: Oropharynx is clear and moist.  Eyes: Pupils are equal, round, and reactive to light.  Neck: Normal range of motion. Neck supple.  Cardiovascular: Normal rate, regular rhythm and normal heart sounds.  Exam reveals no gallop and no friction rub.   No murmur heard. Pulmonary/Chest: Effort normal and breath sounds normal. No respiratory distress. She has no wheezes.  Abdominal: Soft. Bowel sounds are normal. She exhibits no distension. There is no tenderness.  Neurological: She is alert and oriented to person, place, and time. She exhibits normal muscle tone. Coordination normal.  Skin: Skin is warm and dry. No rash noted. No erythema.  Psychiatric: She has a normal mood and affect. Her speech is normal and behavior is normal. Judgment normal. She is not aggressive. Thought content is not paranoid. She expresses suicidal ideation. She  expresses no homicidal ideation. She expresses suicidal plans. She expresses no homicidal plans.  Nursing note and vitals reviewed.   ED Course  Procedures (including critical care time) Labs Review Labs Reviewed  ACETAMINOPHEN LEVEL - Abnormal; Notable for the following:    Acetaminophen (Tylenol), Serum <10 (*)    All other components within normal limits  COMPREHENSIVE METABOLIC PANEL  ETHANOL  SALICYLATE LEVEL  CBC  URINE RAPID DRUG SCREEN, HOSP PERFORMED    Imaging Review No results found. I have personally reviewed and evaluated these images and lab results as part of my medical decision-making.   Will have TTS assessment for suicidal ideations   Dalia Heading, PA-C 09/12/15 [place], MD 09/13/15 1806

## 2015-09-11 NOTE — ED Notes (Signed)
Per pt, states has been going to therapy and getting ECT treatments-states is on meds-states husband walked out on her due to outbursts-states a lot of stuff going on that is hard

## 2015-09-11 NOTE — ED Notes (Signed)
Pt oriented to room and unit.  Pt. Complains of suicidal thoughts but contracts for safety.  She is calm and cooperative.  15 minute checks and video monitoring in place.

## 2015-09-11 NOTE — Progress Notes (Signed)
Pt presented to [facility] with complaints of suicidal thoughts with a plan to OD on her medications or alcohol. Pt verbally contracts for safety at this time. Pt appeared sad and depressed during admission process. Pt  Reported long hx of depression, most recently controlled by Cymbalta and ECT treatments. Pt reported that she recently stopped ECT due to increase confusion and short term memory loss. Pt stated that she couldn't recall previous events were she went out shopping and couldn't recall where she was or items she had purchased. Pt also stated that she recently got lost while traveling to see family in [place], where she have traveled over a dozen times. Pt stated that she's been experiencing anger outburst of yelling and throwing objects. Pt stated that she have these outbursts in her home or in public and it's scary because she never know what might trigger an outburst. Pt reported compliance with medication regimen, occasional alcohol use and no illicit drugs.

## 2015-09-11 NOTE — BH Assessment (Signed)
[facility] Assessment Progress Note   Case was staffed with Reita Cliche DNP who recommended an inpatient admission. Patient was admitted to 405-1.

## 2015-09-11 NOTE — Tx Team (Signed)
Initial Interdisciplinary Treatment Plan   PATIENT STRESSORS: Marital or family conflict Medication change or noncompliance   PATIENT STRENGTHS: Ability for insight Motivation for treatment/growth   PROBLEM LIST: Problem List/Patient Goals Date to be addressed Date deferred Reason deferred Estimated date of resolution  "stop suicidal thoughts" 09/11/15     "stop depression" 09/11/15     "stop anxiety" 09/11/15                                          DISCHARGE CRITERIA:  Ability to meet basic life and health needs Adequate post-discharge living arrangements Improved stabilization in mood, thinking, and/or behavior  PRELIMINARY DISCHARGE PLAN: Attend aftercare/continuing care group Attend PHP/IOP  PATIENT/FAMIILY INVOLVEMENT: This treatment plan has been presented to and reviewed with the patient, Brittany [name], and/or family member.  The patient and family have been given the opportunity to ask questions and make suggestions.  Dhilan Brauer L 09/11/2015, 4:33 PM

## 2015-09-12 DIAGNOSIS — R45851 Suicidal ideations: Secondary | ICD-10-CM

## 2015-09-12 DIAGNOSIS — F332 Major depressive disorder, recurrent severe without psychotic features: Principal | ICD-10-CM

## 2015-09-12 MED ORDER — QUETIAPINE FUMARATE 50 MG PO TABS
150.0000 mg | ORAL_TABLET | Freq: Every day | ORAL | Status: DC
  Administered 2015-09-12 – 2015-09-16 (×5): 150 mg via ORAL
  Filled 2015-09-12 (×7): qty 3

## 2015-09-12 NOTE — BHH Suicide Risk Assessment (Signed)
New Site INPATIENT:  Family/Significant Other Suicide Prevention Education  Suicide Prevention Education:  Patient Refusal for Family/Significant Other Suicide Prevention Education: The patient Brittany [name] has refused to provide written consent for family/significant other to be provided Family/Significant Other Suicide Prevention Education during admission and/or prior to discharge.  Physician notified.  Bo Mcclintock 09/12/2015, 4:24 PM

## 2015-09-12 NOTE — BHH Group Notes (Addendum)
[place] LCSW Group Therapy 09/12/2015 1:15 PM  Type of Therapy: Group Therapy- Emotion Regulation  Pt did not attend as she was meeting with the MD.  Peri Maris, [place] 09/12/2015 4:01 PM

## 2015-09-12 NOTE — Progress Notes (Addendum)
1:1 Note 1830  Patient denied SI and HI.  Denied A/V hallucinations.  Patient stated she is feeling better.  Visitor tonight.  Respirations even and unlabored.  No signs/symptoms of pain/distress noted on patient's face/body movements.  Patient has been laughing and talking to staff.  1:1 continues per MD order for safety.

## 2015-09-12 NOTE — BHH Group Notes (Signed)
[facility] LCSW Aftercare Discharge Planning Group Note  09/12/2015 8:45 AM  Participation Quality: Alert, Appropriate and Oriented  Mood/Affect: Flat; Tearful  Depression Rating: 9  Anxiety Rating: 9  Thoughts of Suicide: Pt endorses SI  Will you contract for safety? Yes  Current AVH: Pt denies  Plan for Discharge/Comments: Pt attended discharge planning group and actively participated in group. CSW discussed suicide prevention education with the group and encouraged them to discuss discharge planning and any relevant barriers. Pt was observed to have flat affect and was tearful at times. Pt expresses that she is having high levels of depression and anxiety.  Transportation Means: Pt reports access to transportation  Supports: No supports mentioned at this time  Peri Maris, [place] 09/12/2015 11:00 AM

## 2015-09-12 NOTE — Progress Notes (Signed)
1:1 Note:  1000  Patient tearful.  Stated she does have suicidal thoughts to take cover off electrical outlets and cut her neck/wrists.  Sales promotion account executive for safety.  Patient stated "Just so hard to deal with it, tired of dealing with it, everything.  Fredrich Birks is great, knows there is a limit.  Husband won't explain, won't answer phone.  I have not been ugly to my husband.  Everything is collapsing.  Friends/coworkers won't talk to me, have to start all over.  We live without our means.  No friends to help me, alienated them.  Husband's 85 yr old daughter and his 84 yr old daughter call me, they care about me.  I have been fighting this for years.  I know I don't have to have a husband.  He's one more person I have managed to push away.  I have volunteered in the past.  I know I am about to lose my job, my husband, can't go on this way."  Patient stated she does have suicidal thoughts to her mother-in-law.  Denied A/V hallucinations.  Denied physical pain.  MHT sitting with patient on 1:1.  Respirations even and unlabored.  1:1 for patient's safety per MD order.
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
## 2015-09-12 NOTE — Progress Notes (Signed)
1:1 Note 1630  Patient has been laying in her bed, resting with eyes closed.  Patient has denied SI and HI thoughts.  Denied A/V hallucinations.  Respirations even and unlabored.  No signs/symptoms of pain/distress noted on patient's face/body movements.  1:1 continues per MD order for safety.

## 2015-09-12 NOTE — H&P (Signed)
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 with her husband and her mother in law, who lives with them. States this has caused her husband to " get tired and he moved out and went to his sister's " Reports history of PTSD, Panic Attacks, some agoraphobia, , in addition to her history of depression. Does not endorse history of psychosis or of mania. No history of suicidal attempts  States she has been on Cymbalta and Seroquel combination x 1 year. States " they help a little, a little bit better than others I have been on". Dr. Adele Schilder is her outpatient psychiatrist . Denies any drug or alcohol abuse . Denies any medical illnesses . Dx- Major Depression, Recurrent  Plan - For now continue SEROQUEL- increase to 150 mgrs QHS -, CYMBALTA- 60 mgrs BID- , continue one to one observation for safety at this time

## 2015-09-12 NOTE — Tx Team (Signed)
Interdisciplinary Treatment Plan Update (Adult) Date: 09/12/2015   Date: 09/12/2015 4:25 PM  Progress in Treatment:  Attending groups: Yes  Participating in groups: Yes  Taking medication as prescribed: Yes  Tolerating medication: Yes  Family/Significant othe contact made: No, Pt declines Patient understands diagnosis: Yes AEB seeking help for depression and anxiety Discussing patient identified problems/goals with staff: Yes  Medical problems stabilized or resolved: Yes  Denies suicidal/homicidal ideation: Endorsed SI today Patient has not harmed self or Others: Yes   New problem(s) identified: None identified at this time.   Discharge Plan or Barriers: Pt will return home and follow-up with [facility]. Pt considering IOP  Additional comments:  Patient and CSW reviewed pt's identified goals and treatment plan. Patient verbalized understanding and agreed to treatment plan. CSW reviewed [facility] "Discharge Process and Patient Involvement" Form. Pt verbalized understanding of information provided and signed form.   Reason for Continuation of Hospitalization:  Anxiety Depression Medication stabilization Suicidal ideation  Estimated length of stay: 3-5 days  Review of initial/current patient goals per problem list:   1.  Goal(s): Patient will participate in aftercare plan  Met:  Yes  Target date: 3-5 days from date of admission   As evidenced by: Patient will participate within aftercare plan AEB aftercare provider and housing plan at discharge being identified.  09/12/15: Pt will return home and follow-up with [facility]. Pt considering IOP  2.  Goal (s): Patient will exhibit decreased depressive symptoms and suicidal ideations.  Met:  No  Target date: 3-5 days from date of admission   As evidenced by: Patient will utilize self rating of depression at 3 or below and demonstrate decreased signs of depression or be deemed stable for discharge by MD. 09/12/15: Pt was  admitted with symptoms of depression, rating 9/10. Pt continues to present with flat affect and depressive symptoms.  Pt will demonstrate decreased symptoms of depression and rate depression at 3/10 or lower prior to discharge.  3.  Goal(s): Patient will demonstrate decreased signs and symptoms of anxiety.  Met:  No  Target date: 3-5 days from date of admission   As evidenced by: Patient will utilize self rating of anxiety at 3 or below and demonstrated decreased signs of anxiety, or be deemed stable for discharge by MD 09/12/15: Pt was admitted with increased levels of anxiety and is currently rating those symptoms highly. Pt will demonstrated decreased symptoms of anxiety and rate it at 3/10 prior to d/c.  Attendees:  Patient:    Family:    Physician: Dr. Parke Poisson, MD  09/12/2015 4:25 PM  Nursing: Lars Pinks, RN Case manager  09/12/2015 4:25 PM  Clinical Social Worker Peri Maris, [place] 09/12/2015 4:25 PM  Other: Tilden Fossa, LCSWA 09/12/2015 4:25 PM  Clinical: Grayland Ormond, RN 09/12/2015 4:25 PM  Other: , RN Charge Nurse 09/12/2015 4:25 PM  Other:     Peri Maris, [place] Work [phone]

## 2015-09-12 NOTE — Progress Notes (Addendum)
Adult Psychoeducational Group Note  Date:  09/12/2015 Time:  9:55 PM  Group Topic/Focus:  Wrap-Up Group:   The focus of this group is to help patients review their daily goal of treatment and discuss progress on daily workbooks.  Participation Level:  Active  Participation Quality:  Appropriate  Affect:  Appropriate  Cognitive:  Alert  Insight: Appropriate  Engagement in Group:  Engaged  Modes of Intervention:  Discussion  Additional Comments:  Patient stated her day started off horrible. Something positive that happened today was "I got to talk to the [facility] one on one about personal things". Patient goal for today was to try to communicate. Brittany [name] 09/12/2015, 9:55 PM

## 2015-09-12 NOTE — Progress Notes (Signed)
Pt has been in her room most of the evening, but she did come to the dayroom to get a snack.  During shift assessment, pt reports that she is still having some suicidal thoughts, but can contract for safety to come to staff if she feels she might hurt herself.  Pt is hopeful that the MD will make some changes to her meds that will help her function day to day.  She denies HI/AVH.  Pt was encouraged to be out of her room more tomorrow on her first full day on the unit.  Support and encouragement offered.  Pt was encouraged to make her needs known to staff.  Pt voices no needs or concerns at this time.  Discharge plans are in process.  Safety maintained with q15 minute checks.

## 2015-09-12 NOTE — Progress Notes (Addendum)
1:1 Note 32  Patient's husband, [name], came by to pick up patient's car keys to take her vehicle home.  Patient denied SI and HI at this time.  Denied A/V hallucinations.  Patient stated she has been feeling better since MHT Frankey Poot has been talking to her about her depression/stress.  Patient has been sitting in dayroom eating lunch.  Respirations even and unlabored.  No signs/symptoms of pain/distress noted on patient's face/body movements.  1:1 continues for safety.  1330  Patient has been with 1:1 today for safety.  Patient denied SI and HI.  Denied A/V hallucinations.  Patient stated she has been feeling better since 1:1 began.  MHT Frankey Poot has been talking to her about depression/anxiety and suicidal thoughts. Patient stated she has talked to her employer about her hospitalization and feels good about her job.   Respirations even and unlabored.  No signs/symptoms of pain/distress noted on patient's face/body movements.  1:1 continues for safety per MD order.
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
## 2015-09-12 NOTE — Progress Notes (Signed)
1:1 note D: Pt presents with depressed affect.  She reports her she is "doing a lot better than this morning, I've been feeling pretty good."  Pt reports she met with her treatment team and "between all of them today I haven't had any suicidal thoughts in hours.  It's really a relief."  Pt reports "some the stuff that was going on has resolved, like I got up with my boss and my husband was able to get my car from [employer].  I drove myself to there and I was worried it might get towed."  Pt reports "I haven't been crying like I was before."  Pt denies SI/HI when speaking to Probation officer.  She denies hallucinations, denies pain.  She has been visible in the milieu interacting appropriately with peers and staff.  Pt reports she has been coloring today and "it helps me not think about the things stressing me out.  I've been reading today too."  Pt reports it was helpful to talk to MHT earlier today as well.  A: Introduced self to pt.  Actively listened to pt and provided support and encouragement.  Medication administered per order.  1:1 staff remains with pt for safety.    R: Pt is compliant with medications.  She verbally contracts for safety.  She reports that if she has thoughts of harming herself she will notify staff, stating "I can promise you that sincerely."  Will continue to monitor and assess for safety.

## 2015-09-12 NOTE — Progress Notes (Signed)
Recreation Therapy Notes  Date: 05.31.2017 Time: 9:30am Location: [address] Group Room   Group Topic: Stress Management  Goal Area(s) Addresses:  Patient will actively participate in stress management techniques presented during session.   Behavioral Response: Did not attend.   Laureen Ochs Joanie Duprey, LRT/CTRS        Milderd Manocchio L 09/12/2015 2:08 PM

## 2015-09-13 DIAGNOSIS — F332 Major depressive disorder, recurrent severe without psychotic features: Secondary | ICD-10-CM | POA: Insufficient documentation

## 2015-09-13 NOTE — Progress Notes (Signed)
Pt is resting bed with her eyes closed.  Pt appears to be asleep.  No distress noted.  Pt is safe.  1:1 continues for safety.

## 2015-09-13 NOTE — Progress Notes (Signed)
Patient ID: Brittany [name], female   DOB: Sep 27, 1965, 50 y.o.   MRN: [id number] D: Client in room reading bible, reports of her day "better, been talking to various people and it's been helpful" Client reports depression "7" of 10. A: Writer provided emotional support encouraged client to reports any concerns. Medications reviewed, administered as prescribed. Staff will monitor q67min for safety. R: Client is safe on the unit, did not attend group.

## 2015-09-13 NOTE — Progress Notes (Signed)
[place] [facility] 1:1 Observation Documentation  For the first (8) hours following discontinuation of 1:1 precautions, a progress note entry by nursing staff should be documented at least every 2 hours, reflecting the patient's behavior, condition, mood, and conversation.  Use the progress notes for additional entries.  Time 1:1 discontinued:  1321  Patient's Behavior:  Pt observed sitting in the dayroom watching t.v. Pt calm and cooperative at this time. Pt denies suicidal thoughts and verbalized that she's in a better place today. Pt verbally contracts for safety.  Patient's Condition:  Pt safety maintained. 15 minute checks performed for safety.  Patient's Conversation:  Logical/coherent.  Shiryl Ruddy L 09/13/2015, 7:24 PM

## 2015-09-13 NOTE — Progress Notes (Signed)
[place] [facility] 1:1 Observation Documentation  For the first (8) hours following discontinuation of 1:1 precautions, a progress note entry by nursing staff should be documented at least every 2 hours, reflecting the patient's behavior, condition, mood, and conversation.  Use the progress notes for additional entries.  Time 1:1 discontinued:  1321  Patient's Behavior:  Calm and cooperative. Participating in group at this time.  Patient's Condition:  Safety maintained. 15 minute checks performed for safety.  Patient's Conversation:  Logical/coherent.    Brittany [name] L 09/13/2015, 1:30 PM

## 2015-09-13 NOTE — Progress Notes (Signed)
[place] [facility] 1:1 Observation Documentation  For the first (8) hours following discontinuation of 1:1 precautions, a progress note entry by nursing staff should be documented at least every 2 hours, reflecting the patient's behavior, condition, mood, and conversation.  Use the progress notes for additional entries.  Time 1:1 discontinued:  1321  Patient's Behavior:  Pt lined up for supper in the hallway.  Pt calm and cooperative.   Patient's Condition:  Pt safety maintained. 15 minute checks performed for safety.  Patient's Conversation:  Logical/coherent   Jaimere Feutz L 09/13/2015, 5:45 PM

## 2015-09-13 NOTE — Progress Notes (Signed)
[place] [facility] 1:1 Observation Documentation  For the first (8) hours following discontinuation of 1:1 precautions, a progress note entry by nursing staff should be documented at least every 2 hours, reflecting the patient's behavior, condition, mood, and conversation.  Use the progress notes for additional entries.  Time 1:1 discontinued:  1321  Patient's Behavior:  Pt observed lying in bed. Pt do not appear to be in distress. Pt denies suicidal thoughts. Pt verbally contracts for safety.  Patient's Condition:  Pt safety maintained. 15 minute checks performed for safety.  Patient's Conversation:  Logical/coherent   Davidjames Blansett L 09/13/2015, 3:28 PM14

## 2015-09-13 NOTE — Progress Notes (Signed)
Pt is resting in bed with her eyes closed.  Pt appears to be asleep.  No distress noted.  1:1 continues for safety.  Pt is safe.

## 2015-09-13 NOTE — BHH Group Notes (Signed)
[facility] Group Therapy 09/13/2015 1:15pm  Type of Therapy: Mental Health Association Presentation  Participation Level: Active  Participation Quality: Attentive  Affect: Appropriate  Cognitive: Oriented  Insight: Developing/Improving  Engagement in Therapy: Engaged  Modes of Intervention: Discussion, Education and Socialization  Summary of Progress/Problems: Mental Health Association ([place]) Speaker came to talk about his personal journey with substance abuse and addiction. The pt processed ways by which to relate to the speaker. [place] speaker provided handouts and educational information pertaining to groups and services offered by the [facility]. Pt was engaged in speaker's presentation and was receptive to resources provided.    Peri Maris, [place] 09/13/2015 2:07 PM

## 2015-09-13 NOTE — Progress Notes (Addendum)
[facility] MD Progress Note  09/13/2015 5:35 PM Hye JAYMEE TILSON  MRN:  [id number] Subjective:  Patient reports she is feeling better today, less severely  depressed, less severely anxious . At this time denies suicidal ideations , and is able to contract for safety on  The unit. Continues to ruminate about marital tension, discord, reports that husband recently left and is now staying with his sister, but states she is less fearful about this . States " I want to work on the marriage, have him come back, but if he does not , I realize I will still be OK, I have a job, and I can manage on my own if I need to". Denies medication side effects. Objective : I have discussed case with treatment team and have met with patient . Patient presents improved compared to admission- mood less severely depressed, affect more reactive, not tearful today. Denies medication side effects at this time. No disruptive or agitated behaviors on unit   Principal Problem: MDD  Diagnosis:   Patient Active Problem List   Diagnosis Date Noted  . Depression, major, severe recurrence ([place]) [F33.2] 09/11/2015  . Major depressive disorder, recurrent, severe without psychotic features ([place]) [F33.2]   . MDD (major depressive disorder) ([place]) [F32.9] 10/30/2014  . MDD (major depressive disorder), recurrent severe, without psychosis ([place]) [F33.2]   . Suicidal ideation [R45.851] 10/05/2014  . Depression [F32.9] 06/12/2014  . Rash [R21] 01/04/2014  . Normocytic anemia [D64.9] 01/04/2014  . DJD (degenerative joint disease) [M19.90] 01/04/2014  . Hypertension [I10] 01/04/2014  . Dyslipidemia [E78.5] 01/04/2014  . GERD (gastroesophageal reflux disease) [K21.9] 01/04/2014  . Obstructive sleep apnea [G47.33] 01/04/2014  . 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 Abdominal abscess ([place]) [K65.1] 01/01/2014  . Wound infection after surgery [T81.4XXA] 01/01/2014  . PTSD (post-traumatic stress disorder) [F43.10] 11/23/2013  .  Severe recurrent major depression without psychotic features ([place]) [F33.2] 11/22/2013  . 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 Morbid obesity, Weight - 312, BMI - 45.2 [E66.01] 04/21/2012   Total Time spent with patient: 20 minutes  *  Past Medical History:  Past Medical History  Diagnosis Date  . Migraine   . Hypertension   . Hypercholesterolemia   . Depression   . Morbid obesity ([place])   . Sleep apnea     no CPAP machine   . H/O hiatal hernia   . GERD (gastroesophageal reflux disease)     hx of   . Arthritis     hips, knees and hands   . Anxiety   . PTSD (post-traumatic stress disorder)     Past Surgical History  Procedure Laterality Date  . Foot surgery    . Breath tek h pylori  05/03/2012    Procedure: BREATH TEK H PYLORI;  Surgeon: [name], MD;  Location: [name];  Service: General;  Laterality: N/A;  . Right tube and ovary removed     . Abdominal hysterectomy  1997 & 2006  . Colonoscopy      hx of benign polyps   . 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 Upper gi endoscopy N/A 07/05/2012    Procedure: UPPER GI ENDOSCOPY;  Surgeon: [name], MD;  Location: [facility];  Service: General;  Laterality: N/A;  . Laparoscopic lysis of adhesions N/A 07/05/2012    Procedure:  LAPAROSCOPIC LYSIS OF ADHESIONS;  Surgeon: [name], MD;  Location: [facility];  Service: General;  Laterality: N/A;  repair of abdominal wall hernia  . Irrigation and debridement abscess N/A 01/01/2014    Procedure: IRRIGATION AND DEBRIDEMENT ABSCESS;  Surgeon: [name], MD;  Location: [facility];  Service: General;  Laterality: N/A;  . Gastric bypass     Family History:  Family History  Problem Relation Age of Onset  . Cancer Mother     colon  . Cancer Maternal Aunt     breast  . Depression Father   . Depression Sister    . Post-traumatic stress disorder Sister   . Alcohol abuse Paternal Uncle   . Bipolar disorder Cousin   . Anxiety disorder Sister     Social History:  History  Alcohol Use No    Comment: Occasional use     History  Drug Use No    Social History   Social History  . Marital Status: Married    Spouse Name: N/A  . Number of Children: N/A  . Years of Education: N/A   Social History Main Topics  . Smoking status: Former Smoker    Types: Cigars    Quit date: 04/27/2004  . Smokeless tobacco: Never Used  . Alcohol Use: No     Comment: Occasional use  . Drug Use: No  . Sexual Activity: Yes   Other Topics Concern  . None   Social History Narrative   Additional Social History:   Sleep: improved   Appetite:  Good  Current Medications: Current Facility-Administered Medications  Medication Dose Route Frequency Provider Last Rate Last Dose  . acetaminophen (TYLENOL) tablet 650 mg  650 mg Oral Q6H PRN Patrecia Pour, NP      . calcium-vitamin D (OSCAL WITH D) 500-200 MG-UNIT per tablet 3 tablet  3 tablet Oral q morning - 10a Patrecia Pour, NP   3 tablet at 09/13/15 0815  . cholecalciferol (VITAMIN D) tablet 1,000 Units  1,000 Units Oral Daily Patrecia Pour, NP   1,000 Units at 09/13/15 0815  . cyanocobalamin tablet 500 mcg  500 mcg Oral Daily Patrecia Pour, NP   500 mcg at 09/13/15 0815  . DULoxetine (CYMBALTA) DR capsule 60 mg  60 mg Oral BID Patrecia Pour, NP   60 mg at 09/13/15 1644  . loratadine (CLARITIN) tablet 10 mg  10 mg Oral Daily Patrecia Pour, NP   10 mg at 09/13/15 0815  . magnesium hydroxide (MILK OF MAGNESIA) suspension 30 mL  30 mL Oral Daily PRN Patrecia Pour, NP      . multivitamin with minerals tablet 1 tablet  1 tablet Oral Daily Patrecia Pour, NP   1 tablet at 09/13/15 0815  . QUEtiapine (SEROQUEL) tablet 150 mg  150 mg Oral QHS Nanci Pina, FNP   150 mg at 09/12/15 2107  . tobramycin-dexamethasone (TOBRADEX) ophthalmic suspension 1 drop  1 drop  Both Eyes QID Patrecia Pour, NP   1 drop at 09/13/15 1644    Lab Results: No results found for this or any previous visit (from the past 48 hour(s)).  Blood Alcohol level:  Lab Results  Component Value Date   [facility] <5 09/11/2015   ETH <11 11/22/2013    Physical Findings: AIMS: Facial and Oral Movements Muscles of Facial Expression: None, normal Lips and Perioral Area: None, normal Jaw: None, normal Tongue: None, normal,Extremity Movements Upper (arms, wrists, hands,  fingers): None, normal Lower (legs, knees, ankles, toes): None, normal, Trunk Movements Neck, shoulders, hips: None, normal, Overall Severity Severity of abnormal movements (highest score from questions above): None, normal Incapacitation due to abnormal movements: None, normal Patient's awareness of abnormal movements (rate only patient's report): No Awareness, Dental Status Current problems with teeth and/or dentures?: No Does patient usually wear dentures?: No  CIWA:  CIWA-Ar Total: 1 COWS:  COWS Total Score: 2  Musculoskeletal: Strength & Muscle Tone: within normal limits Gait & Station: normal Patient leans: N/A  Psychiatric Specialty Exam: Physical Exam  ROS no headaches, no chest pain, no shortness of breath, no vomiting   Blood pressure 111/74, pulse 83, temperature 98.5 F (36.9 C), temperature source Oral, resp. rate 18, height _0  (1.778 m), weight 226 lb (102.513 kg).Body mass index is 32.43 kg/(m^2).  General Appearance: improved grooming   Eye Contact:  Good  Speech:  Normal Rate  Volume:  Normal  Mood:  still depressed, but improving   Affect:  Appropriate, less severely constricted, not tearful today  Thought Process:  Linear  Orientation:  Full (Time, Place, and Person)  Thought Content:  denies hallucinations, no delusions   Suicidal Thoughts:  No- at this time denies any suicidal ideations, denies any self injurious ideations   Homicidal Thoughts:  No denies any homicidal ideations,  and specifically also denies any homicidal or violent ideations towards his husband or mother in law, who lives with them  Memory:  recent and remote grossly intact   Judgement:  improving  Insight:  Present  Psychomotor Activity:  Normal  Concentration:  Concentration: Good and Attention Span: Good  Recall:  Good  Fund of Knowledge:  Good  Language:  Good  Akathisia:  Negative  Handed:  Right  AIMS (if indicated):     Assets:  Desire for Improvement Resilience  ADL's:  Intact  Cognition:  WNL  Sleep:  Number of Hours: 6.5   Assessment - patient presents with improving mood, less severe depression, and affect is more reactive. Today denies suicidal ideations, and denies any self injurious ideations . Also denies any homicidal or violent ideations . Tolerating medications well at this time.  Treatment Plan Summary: Daily contact with patient to assess and evaluate symptoms and progress in treatment, Medication management, Plan inpatient treatment  and medications as below  Continue inpatient treatment, continue to encourage group and milieu participation to work on coping skills and symptom reduction Continue Cymbalta 60 mgrs BID for depression Continue Seroquel 150 mgrs QHS for mood disorder  As discussed with nursing staff , will discontinue one to one observation at this time Treatment team working on disposition planning   Neita Garnet, MD 09/13/2015, 5:35 PM

## 2015-09-13 NOTE — Plan of Care (Signed)
Problem: Activity: Goal: Sleeping patterns will improve Outcome: Progressing Pt slept 6.5 hours last night according to check sheet.

## 2015-09-13 NOTE — Progress Notes (Signed)
Observation note: Pt observed sitting in the dayroom with sitter present. Pt denies suicidal thoughts. Pt verbally contracts for safety. Pt verbalized to writer this morning that her family is supportive and she feels good about having their support.

## 2015-09-14 NOTE — BHH Group Notes (Signed)
[place] LCSW Group Therapy  09/14/2015  1:05 PM  Type of Therapy:  Group therapy  Participation Level:  Active  Participation Quality:  Attentive  Affect:  Flat  Cognitive:  Oriented  Insight:  Limited  Engagement in Therapy:  Limited  Modes of Intervention:  Discussion, Socialization  Summary of Progress/Problems:  Chaplain was here to lead a group on themes of hope and courage. "I need a lot of courage right now.  The future is scarey and uncertain.  I am on the eve of my 3th wedding anniversary, and my husband says he is done.  I don't know what the future holds," Went on to talk about family and how she is thankful that she has supports that are there for her.  Also talked about how this could be an opportunity to look for to change careers to something that is more fulfilling.  "There are times when I feel so alone and have a hard time crawling out of it." Brittany [name] B 09/14/2015 1:41 PM

## 2015-09-14 NOTE — Progress Notes (Signed)
[facility] MD Progress Note  09/14/2015 11:29 AM Darina CHARLSEY TUN  MRN:  [id number] Subjective:  Patient reports she is a bit drowsy today due to her medication dosage increase , but continues to feel better otherwise . "  Objective:Ashten E Joos is a 50 y.o. Caucasian female, who is separated , who presented as a voluntary admission due to thoughts of self harm with patient stating she had a plan to overdose on medications. Pt seen as chart reviewed . I have discussed pt with treatment team. Pt today continues to ruminate about her marital discord , still is anxious and depressed about it . However , she is hopeful that her medications will help her and is hopeful to get in to an IOP which worked for her in the past. She does report drowsiness on her current medications - but states it is too early and does not want it reduced due to that. No disruptive or agitated behaviors on unit .   Principal Problem: MDD  Diagnosis:   Patient Active Problem List   Diagnosis Date Noted  . Severe episode of recurrent major depressive disorder, without psychotic features ([place]) [F33.2]   . Depression, major, severe recurrence ([place]) [F33.2] 09/11/2015  . Major depressive disorder, recurrent, severe without psychotic features ([place]) [F33.2]   . MDD (major depressive disorder) ([place]) [F32.9] 10/30/2014  . MDD (major depressive disorder), recurrent severe, without psychosis ([place]) [F33.2]   . Suicidal ideation [R45.851] 10/05/2014  . Depression [F32.9] 06/12/2014  . Rash [R21] 01/04/2014  . Normocytic anemia [D64.9] 01/04/2014  . DJD (degenerative joint disease) [M19.90] 01/04/2014  . Hypertension [I10] 01/04/2014  . Dyslipidemia [E78.5] 01/04/2014  . GERD (gastroesophageal reflux disease) [K21.9] 01/04/2014  . Obstructive sleep apnea [G47.33] 01/04/2014  . 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 Abdominal abscess ([place]) [K65.1] 01/01/2014  . Wound infection after surgery [T81.4XXA] 01/01/2014  . PTSD  (post-traumatic stress disorder) [F43.10] 11/23/2013  . Severe recurrent major depression without psychotic features ([place]) [F33.2] 11/22/2013  . 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 Morbid obesity, Weight - 312, BMI - 45.2 [E66.01] 04/21/2012   Total Time spent with patient: 20 minutes  *  Past Medical History:  Past Medical History  Diagnosis Date  . Migraine   . Hypertension   . Hypercholesterolemia   . Depression   . Morbid obesity ([place])   . Sleep apnea     no CPAP machine   . H/O hiatal hernia   . GERD (gastroesophageal reflux disease)     hx of   . Arthritis     hips, knees and hands   . Anxiety   . PTSD (post-traumatic stress disorder)     Past Surgical History  Procedure Laterality Date  . Foot surgery    . Breath tek h pylori  05/03/2012    Procedure: BREATH TEK H PYLORI;  Surgeon: [name], MD;  Location: [name];  Service: General;  Laterality: N/A;  . Right tube and ovary removed     . Abdominal hysterectomy  1997 & 2006  . Colonoscopy      hx of benign polyps   . 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 Upper gi endoscopy N/A 07/05/2012    Procedure: UPPER GI ENDOSCOPY;  Surgeon: [name], MD;  Location: [facility];  Service: General;  Laterality: N/A;  . Laparoscopic lysis of adhesions N/A 07/05/2012    Procedure: LAPAROSCOPIC LYSIS OF ADHESIONS;  Surgeon: [name], MD;  Location: [facility];  Service: General;  Laterality: N/A;  repair of abdominal wall hernia  . Irrigation and debridement abscess N/A 01/01/2014    Procedure: IRRIGATION AND DEBRIDEMENT ABSCESS;  Surgeon: [name], MD;  Location: [facility];  Service: General;  Laterality: N/A;  . Gastric bypass     Family History:  Family History  Problem Relation Age of Onset  . Cancer Mother     colon  . Cancer Maternal Aunt      breast  . Depression Father   . Depression Sister   . Post-traumatic stress disorder Sister   . Alcohol abuse Paternal Uncle   . Bipolar disorder Cousin   . Anxiety disorder Sister     Social History:  History  Alcohol Use No    Comment: Occasional use     History  Drug Use No    Social History   Social History  . Marital Status: Married    Spouse Name: N/A  . Number of Children: N/A  . Years of Education: N/A   Social History Main Topics  . Smoking status: Former Smoker    Types: Cigars    Quit date: 04/27/2004  . Smokeless tobacco: Never Used  . Alcohol Use: No     Comment: Occasional use  . Drug Use: No  . Sexual Activity: Yes   Other Topics Concern  . None   Social History Narrative   Additional Social History:   Sleep: improved   Appetite:  Good  Current Medications: Current Facility-Administered Medications  Medication Dose Route Frequency Provider Last Rate Last Dose  . acetaminophen (TYLENOL) tablet 650 mg  650 mg Oral Q6H PRN Patrecia Pour, NP      . calcium-vitamin D (OSCAL WITH D) 500-200 MG-UNIT per tablet 3 tablet  3 tablet Oral q morning - 10a Patrecia Pour, NP   3 tablet at 09/14/15 [id number]  . cholecalciferol (VITAMIN D) tablet 1,000 Units  1,000 Units Oral Daily Patrecia Pour, NP   1,000 Units at 09/14/15 0929  . cyanocobalamin tablet 500 mcg  500 mcg Oral Daily Patrecia Pour, NP   500 mcg at 09/14/15 [id number]  . DULoxetine (CYMBALTA) DR capsule 60 mg  60 mg Oral BID Patrecia Pour, NP   60 mg at 09/14/15 [id number]  . loratadine (CLARITIN) tablet 10 mg  10 mg Oral Daily Patrecia Pour, NP   10 mg at 09/14/15 0929  . magnesium hydroxide (MILK OF MAGNESIA) suspension 30 mL  30 mL Oral Daily PRN Patrecia Pour, NP      . multivitamin with minerals tablet 1 tablet  1 tablet Oral Daily Patrecia Pour, NP   1 tablet at 09/14/15 [id number]  . QUEtiapine (SEROQUEL) tablet 150 mg  150 mg Oral QHS Nanci Pina, FNP   150 mg at 09/13/15 2210  .  tobramycin-dexamethasone (TOBRADEX) ophthalmic suspension 1 drop  1 drop Both Eyes QID Patrecia Pour, NP   1 drop at 09/14/15 [id number]    Lab Results: No results found for this or any previous visit (from the past 48 hour(s)).  Blood Alcohol level:  Lab Results  Component Value Date   [facility] <5 09/11/2015   ETH <11 11/22/2013    Physical Findings: AIMS: Facial and Oral Movements Muscles of Facial Expression: None, normal Lips and Perioral  Area: None, normal Jaw: None, normal Tongue: None, normal,Extremity Movements Upper (arms, wrists, hands, fingers): None, normal Lower (legs, knees, ankles, toes): None, normal, Trunk Movements Neck, shoulders, hips: None, normal, Overall Severity Severity of abnormal movements (highest score from questions above): None, normal Incapacitation due to abnormal movements: None, normal Patient's awareness of abnormal movements (rate only patient's report): No Awareness, Dental Status Current problems with teeth and/or dentures?: No Does patient usually wear dentures?: No  CIWA:  CIWA-Ar Total: 1 COWS:  COWS Total Score: 2  Musculoskeletal: Strength & Muscle Tone: within normal limits Gait & Station: normal Patient leans: N/A  Psychiatric Specialty Exam: Physical Exam  Review of Systems  Psychiatric/Behavioral: Positive for depression. The patient is nervous/anxious.   All other systems reviewed and are negative.  no headaches, no chest pain, no shortness of breath, no vomiting   Blood pressure 108/62, pulse 81, temperature 98 F (36.7 C), temperature source Oral, resp. rate 16, height 5\' 10"  (1.778 m), weight 102.513 kg (226 lb).Body mass index is 32.43 kg/(m^2).  General Appearance: improved grooming   Eye Contact:  Good  Speech:  Normal Rate  Volume:  Normal  Mood:  still depressed, but improving   Affect:  Appropriate, less severely constricted, not tearful today  Thought Process:  Linear  Orientation:  Full (Time, Place, and Person)   Thought Content:  denies hallucinations, no delusions   Suicidal Thoughts:  No- at this time denies any suicidal ideations, denies any self injurious ideations   Homicidal Thoughts:  No denies any homicidal ideations  Memory:  recent and remote grossly intact , immediate - fair  Judgement:  improving  Insight:  Present  Psychomotor Activity:  Normal  Concentration:  Concentration: Good and Attention Span: Good  Recall:  Good  Fund of Knowledge:  Good  Language:  Good  Akathisia:  Negative  Handed:  Right  AIMS (if indicated):     Assets:  Desire for Improvement Resilience  ADL's:  Intact  Cognition:  WNL  Sleep:  Number of Hours: 6.75   Assessment - patient presents with improving mood, less severe depression, and affect is more reactive. Pt tolerating her medications well, does have some drowsiness- will continue to observe on the unit.   Treatment Plan Summary: Daily contact with patient to assess and evaluate symptoms and progress in treatment, Medication management, Plan inpatient treatment  and medications as below   Will continue plan as per Dr.Cobos below - no changes made today 09/14/15. Continue inpatient treatment, continue to encourage group and milieu participation to work on coping skills and symptom reduction Continue Cymbalta 60 mg po  BID for depression Continue Seroquel 150 mg po QHS for mood disorder. Treatment team working on disposition planning  . Ariely Riddell, MD 09/14/2015, 11:29 AM

## 2015-09-14 NOTE — Progress Notes (Signed)
[facility] 1:1 Observation Documentation  For the first (8) hours following discontinuation of 1:1 precautions, a progress note entry by nursing staff should be documented at least every 2 hours, reflecting the patient's behavior, condition, mood, and conversation.  Use the progress notes for additional entries.  Time 1:1 discontinued:  13:21  Patient's Behavior:  Client calm, denies SI  Patient's Condition:  Client is pleasant, sitting up in bed reading bible.  Patient's Conversation:  "day been better, talking to various people"   Zoe Lan 09/14/2015, 12:53 AM

## 2015-09-14 NOTE — Progress Notes (Signed)
Brittany  Brittany [name] is OOB UAL on the 400 hall this morning...she tolerates this well. She takes her scheduled meds as planned - late_ and says she was " still sleepy after breakfast so I had to go back to sleep". A She is cooperative. Somewhat distant. She completes her daily assessment and on it she wrote she denied SI today and she rated her depression, hopelessness and anxiety " 6/5/7", respectively. R Safety in place.

## 2015-09-14 NOTE — Progress Notes (Signed)
Adult Psychoeducational Group Note  Date:  09/14/2015 Time:  11:43 PM  Group Topic/Focus:  Wrap-Up Group:   The focus of this group is to help patients review their daily goal of treatment and discuss progress on daily workbooks.  Participation Level:  Active  Participation Quality:  Appropriate  Affect:  Appropriate  Cognitive:  Oriented  Insight: Good  Engagement in Group:  Engaged  Modes of Intervention:  Activity  Additional Comments:  Patient rated her day a 7. Stated she got to see her daughter today and had a good day. Goal is to control anxiety more.  Donato Heinz 09/14/2015, 11:43 PM

## 2015-09-14 NOTE — Progress Notes (Signed)
Patient ID: Brittany [name], female   DOB: 1965-12-15, 50 y.o.   MRN: [id number] D: Client in dayroom this evening reports "good day" "went to a spiritual group today he talked about courage, that was helpful to me" "my daughter came tonight, feel better, wanted her to stay" "I know I got a lot in front of me, but it will work out" Client reports depression "3" of 10. A: Writer provided emotional support encouraged client to continue spiritual support. Medication reviewed, administered as ordered. Staff will monitor q23min for safety. R: Client is safe on the unit, agrees that spiritual support was very helpful. Client attended group.

## 2015-09-14 NOTE — Plan of Care (Signed)
Problem: Safety: Goal: Periods of time without injury will increase Outcome: Progressing Client is safe on the unit, AEB q72min safety checks, client denies SI.

## 2015-09-15 MED ORDER — CLINDAMYCIN PHOSPHATE 2 % VA CREA
1.0000 | TOPICAL_CREAM | Freq: Every day | VAGINAL | Status: DC
  Filled 2015-09-15: qty 40

## 2015-09-15 NOTE — Progress Notes (Signed)
Patient observed up in the dayroom wearing her pajamas watching tv with minimal interaction with peers. Writer spoke with her 1:1 and she reports that her day was better and she was able to get some rest. She talked a little about her marital issues and working on [employer] for [employer]. She reports that she is working on the stressors that got her here. Support givne and safety maintained on unit with 15 min checks.

## 2015-09-15 NOTE — Progress Notes (Signed)
[facility] MD Progress Note  09/15/2015 4:29 PM Brittany [name]  MRN:  [id number]   Subjective:  Patient reports" I am feeling okay today. I am going to Outpatient on Wednesday."  Objective:Brittany [name] is awake, alert and oriented X4. found attending group session.  Denies suicidal or homicidal ideation. Denies auditory or visual hallucination and does not appear to be responding to internal stimuli. Patient reports attending group session. Patient reports her is medication compliant without mediation side effects. States her depression 4/10. Patient states "I am feeling okay today. However her Seroquel  is making me feel tired in the morning. States she wants to continue to take this despite the side effect. Reports good appetite  and resting well. Support, encouragement and reassurance was provided.   Principal Problem: MDD  Diagnosis:   Patient Active Problem List   Diagnosis Date Noted  . Severe episode of recurrent major depressive disorder, without psychotic features ([place]) [F33.2]   . Depression, major, severe recurrence ([place]) [F33.2] 09/11/2015  . Major depressive disorder, recurrent, severe without psychotic features ([place]) [F33.2]   . MDD (major depressive disorder) ([employer]) [F32.9] 10/30/2014  . MDD (major depressive disorder), recurrent severe, without psychosis ([place]) [F33.2]   . Suicidal ideation [R45.851] 10/05/2014  . Depression [F32.9] 06/12/2014  . Rash [R21] 01/04/2014  . Normocytic anemia [D64.9] 01/04/2014  . DJD (degenerative joint disease) [M19.90] 01/04/2014  . Hypertension [I10] 01/04/2014  . Dyslipidemia [E78.5] 01/04/2014  . GERD (gastroesophageal reflux disease) [K21.9] 01/04/2014  . Obstructive sleep apnea [G47.33] 01/04/2014  . 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 Abdominal abscess ([place]) [K65.1] 01/01/2014  . Wound infection after surgery [T81.4XXA] 01/01/2014  . PTSD (post-traumatic stress disorder) [F43.10] 11/23/2013  . Severe recurrent major  depression without psychotic features ([place]) [F33.2] 11/22/2013  . 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 Morbid obesity, Weight - 312, BMI - 45.2 [E66.01] 04/21/2012   Total Time spent with patient: 20 minutes  *  Past Medical History:  Past Medical History  Diagnosis Date  . Migraine   . Hypertension   . Hypercholesterolemia   . Depression   . Morbid obesity ([place])   . Sleep apnea     no CPAP machine   . H/O hiatal hernia   . GERD (gastroesophageal reflux disease)     hx of   . Arthritis     hips, knees and hands   . Anxiety   . PTSD (post-traumatic stress disorder)     Past Surgical History  Procedure Laterality Date  . Foot surgery    . Breath tek h pylori  05/03/2012    Procedure: BREATH TEK H PYLORI;  Surgeon: [name], MD;  Location: [name];  Service: General;  Laterality: N/A;  . Right tube and ovary removed     . Abdominal hysterectomy  1997 & 2006  . Colonoscopy      hx of benign polyps   . 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 Upper gi endoscopy N/A 07/05/2012    Procedure: UPPER GI ENDOSCOPY;  Surgeon: [name], MD;  Location: [facility];  Service: General;  Laterality: N/A;  . Laparoscopic lysis of adhesions N/A 07/05/2012    Procedure: LAPAROSCOPIC LYSIS OF ADHESIONS;  Surgeon: [name], MD;  Location: [facility];  Service: General;  Laterality: N/A;  repair  of abdominal wall hernia  . Irrigation and debridement abscess N/A 01/01/2014    Procedure: IRRIGATION AND DEBRIDEMENT ABSCESS;  Surgeon: [name], MD;  Location: [facility];  Service: General;  Laterality: N/A;  . Gastric bypass     Family History:  Family History  Problem Relation Age of Onset  . Cancer Mother     colon  . Cancer Maternal Aunt     breast  . Depression Father   . Depression Sister   . Post-traumatic  stress disorder Sister   . Alcohol abuse Paternal Uncle   . Bipolar disorder Cousin   . Anxiety disorder Sister     Social History:  History  Alcohol Use No    Comment: Occasional use     History  Drug Use No    Social History   Social History  . Marital Status: Married    Spouse Name: N/A  . Number of Children: N/A  . Years of Education: N/A   Social History Main Topics  . Smoking status: Former Smoker    Types: Cigars    Quit date: 04/27/2004  . Smokeless tobacco: Never Used  . Alcohol Use: No     Comment: Occasional use  . Drug Use: No  . Sexual Activity: Yes   Other Topics Concern  . None   Social History Narrative   Additional Social History:   Sleep: improved   Appetite:  Good  Current Medications: Current Facility-Administered Medications  Medication Dose Route Frequency Provider Last Rate Last Dose  . acetaminophen (TYLENOL) tablet 650 mg  650 mg Oral Q6H PRN Patrecia Pour, NP      . calcium-vitamin D (OSCAL WITH D) 500-200 MG-UNIT per tablet 3 tablet  3 tablet Oral q morning - 10a Patrecia Pour, NP   3 tablet at 09/15/15 1007  . cholecalciferol (VITAMIN D) tablet 1,000 Units  1,000 Units Oral Daily Patrecia Pour, NP   1,000 Units at 09/15/15 0743  . cyanocobalamin tablet 500 mcg  500 mcg Oral Daily Patrecia Pour, NP   500 mcg at 09/15/15 0741  . DULoxetine (CYMBALTA) DR capsule 60 mg  60 mg Oral BID Patrecia Pour, NP   60 mg at 09/15/15 0741  . loratadine (CLARITIN) tablet 10 mg  10 mg Oral Daily Patrecia Pour, NP   10 mg at 09/15/15 0741  . magnesium hydroxide (MILK OF MAGNESIA) suspension 30 mL  30 mL Oral Daily PRN Patrecia Pour, NP      . multivitamin with minerals tablet 1 tablet  1 tablet Oral Daily Patrecia Pour, NP   1 tablet at 09/15/15 0741  . QUEtiapine (SEROQUEL) tablet 150 mg  150 mg Oral QHS Nanci Pina, FNP   150 mg at 09/14/15 2130  . tobramycin-dexamethasone (TOBRADEX) ophthalmic suspension 1 drop  1 drop Both Eyes QID  Patrecia Pour, NP   1 drop at 09/15/15 1216    Lab Results: No results found for this or any previous visit (from the past 48 hour(s)).  Blood Alcohol level:  Lab Results  Component Value Date   [facility] <5 09/11/2015   ETH <11 11/22/2013    Physical Findings: AIMS: Facial and Oral Movements Muscles of Facial Expression: None, normal Lips and Perioral Area: None, normal Jaw: None, normal Tongue: None, normal,Extremity Movements Upper (arms, wrists, hands, fingers): None, normal Lower (legs, knees, ankles, toes): None, normal, Trunk Movements Neck, shoulders, hips: None, normal, Overall Severity Severity of abnormal  movements (highest score from questions above): None, normal Incapacitation due to abnormal movements: None, normal Patient's awareness of abnormal movements (rate only patient's report): No Awareness, Dental Status Current problems with teeth and/or dentures?: No Does patient usually wear dentures?: No  CIWA:  CIWA-Ar Total: 1 COWS:  COWS Total Score: 2  Musculoskeletal: Strength & Muscle Tone: within normal limits Gait & Station: normal Patient leans: N/A  Psychiatric Specialty Exam: Physical Exam  Nursing note and vitals reviewed. Constitutional: She is oriented to person, place, and time. She appears well-developed and well-nourished.  Cardiovascular: Normal rate and regular rhythm.   Neurological: She is alert and oriented to person, place, and time.  Skin: Skin is warm and dry.  Psychiatric: She has a normal mood and affect. Her behavior is normal. Thought content normal.    Review of Systems  Psychiatric/Behavioral: Positive for depression. The patient is nervous/anxious.   All other systems reviewed and are negative.  no headaches, no chest pain, no shortness of breath, no vomiting   Blood pressure 104/80, pulse 79, temperature 98.3 F (36.8 C), temperature source Oral, resp. rate 16, height 5\' 10"  (1.778 m), weight 102.513 kg (226 lb).Body mass index is  32.43 kg/(m^2).  General Appearance: improved grooming   Eye Contact:  Good  Speech:  Normal Rate  Volume:  Normal  Mood:  still depressed, but improving   Affect:  Appropriate  Thought Process:  Linear  Orientation:  Full (Time, Place, and Person)  Thought Content:  denies hallucinations, no delusions   Suicidal Thoughts:  No- at this time denies any suicidal ideations, denies any self injurious ideations   Homicidal Thoughts:  No denies any homicidal ideations  Memory:  Immediate;   Fair Recent;   Fair Remote;   Fair,   Judgement:  improving  Insight:  Present  Psychomotor Activity:  Normal  Concentration:  Concentration: Good and Attention Span: Good  Recall:  Good  Fund of Knowledge:  Good  Language:  Good  Akathisia:  Negative  Handed:  Right  AIMS (if indicated):     Assets:  Desire for Improvement Resilience  ADL's:  Intact  Cognition:  WNL  Sleep:  Number of Hours: 6.75    I agree with current treatment plan on 06/03/017, Patient seen face-to-face for psychiatric evaluation follow-up, chart reviewed. Reviewed the information documented and agree with the treatment plan.  Treatment Plan Summary: Daily contact with patient to assess and evaluate symptoms and progress in treatment, Medication management, Plan inpatient treatment  and medications as below   Continue inpatient treatment, continue to encourage group and milieu participation to work on coping skills and symptom reduction Continue Cymbalta 60 mg po  BID for depression Continue Seroquel 150 mg po QHS for mood disorder. Treatment team working on disposition [place], NP 09/15/2015, 4:29 PM  I reviewed chart and agreed with the findings and treatment Plan.  Berniece Andreas, MD

## 2015-09-15 NOTE — Progress Notes (Signed)
D: Patient denies SI/HI and A/V hallucinations; patient reports sleep is good; patient reports that she slept all night;  reports appetite is good ; reports energy level is normal ; reports ability to pay attention is good; rates depression as 5/10; rates hopelessness 4/10; rates anxiety as 5/10;    A: Monitored q 15 minutes; patient encouraged to attend groups; patient educated about medications; patient given medications per physician orders; patient encouraged to express feelings and/or concerns  R: Patient is pleasant and animated; patient is cooperative and appropriate to circumstances;   patient's interaction with staff and peers is appropriate; patient was able to set goal to talk with staff 1:1 when having feelings of SI; patient is taking medications as prescribed and tolerating medications; patient is attending all groups

## 2015-09-15 NOTE — [facility] Group Notes (Signed)
[facility] LCSW Group Therapy  09/15/2015 10:00 AM  Type of Therapy:  Group Therapy  Participation Level:  Active  Participation Quality:  Appropriate and Attentive  Affect:  Appropriate  Cognitive:  Alert, Appropriate and Oriented  Insight:  Improving  Engagement in Therapy:  Engaged  Modes of Intervention:  Discussion  Summary of Progress/Problems: Group today was about "Show and Tell." Participants were able to discuss and develop process to engage several parts of their identity for self-actualization and goal planning. Patients were able to discuss how they identify themselves, how their past has defined them and how others perceive them. Patients engaged in identifying goals to reflect how they can use the concepts discussed in creating appropriate plans to achieve goals. Patient was very engaged in the group process. Patient was willing to share that at this time in her state of treatment she feels like she is trying to learn and understand her identity.   Christene Lye 09/15/2015, 3:30 PM

## 2015-09-16 NOTE — Progress Notes (Signed)
[name]:  (Nursing/MHT/Case Management/Adjunct)  Date:  09/16/2015  Time:  12:11 AM  Type of Therapy:  Psychoeducational Skills  Participation Level:  Minimal  Participation Quality:  Attentive  Affect:  Appropriate  Cognitive:  Appropriate  Insight:  Appropriate  Engagement in Group:  Engaged  Modes of Intervention:  Education  Summary of Progress/Problems: The patient verbalized in group that she had a good day since she was able to get some rest. As for the theme of the day, her coping skill will be to communicate more.   Brittany [name] S 09/16/2015, 12:11 AM

## 2015-09-16 NOTE — Progress Notes (Signed)
[name] MD Progress Note  09/16/2015 1:47 PM Brittany [name]  MRN:  [id number]   Subjective:  Patient reports" I am doing well today,  I feel ready to discharge. My mother in law has left our house." This will reduce some of my stress."   Objective:Brittany [name] is awake, alert and oriented X4. found attending group session.  Denies suicidal or homicidal ideation. Denies auditory or visual hallucination and does not appear to be responding to internal stimuli. Patient reports attending group session. Patient reports she is dealing with her stressors one situation at a time. States she is unsure of the state of her marriage, but her mother in law has moved out. Patient reports her is medication compliant and is tolerating medications well. States her depression 5/10. Patient continues to endorse tiredness Seroquel.States she wants to continue to take this despite the side effect. Reports good appetite  and resting well. Patient reports she is excited to start IOP(intensive out-patient) on Wendnesday.Support, encouragement and reassurance was provided.   Principal Problem: MDD  Diagnosis:   Patient Active Problem List   Diagnosis Date Noted  . Severe episode of recurrent major depressive disorder, without psychotic features ([place]) [F33.2]   . Depression, major, severe recurrence ([name]) [F33.2] 09/11/2015  . Major depressive disorder, recurrent, severe without psychotic features ([place]) [F33.2]   . MDD (major depressive disorder) ([place]) [F32.9] 10/30/2014  . MDD (major depressive disorder), recurrent severe, without psychosis ([name]) [F33.2]   . Suicidal ideation [R45.851] 10/05/2014  . Depression [F32.9] 06/12/2014  . Rash [R21] 01/04/2014  . Normocytic anemia [D64.9] 01/04/2014  . DJD (degenerative joint disease) [M19.90] 01/04/2014  . Hypertension [I10] 01/04/2014  . Dyslipidemia [E78.5] 01/04/2014  . GERD (gastroesophageal reflux disease) [K21.9] 01/04/2014  . Obstructive sleep apnea [G47.33] 01/04/2014   . 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 Abdominal abscess ([place]) [K65.1] 01/01/2014  . Wound infection after surgery [T81.4XXA] 01/01/2014  . PTSD (post-traumatic stress disorder) [F43.10] 11/23/2013  . Severe recurrent major depression without psychotic features ([name]) [F33.2] 11/22/2013  . 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 Morbid obesity, Weight - 312, BMI - 45.2 [E66.01] 04/21/2012   Total Time spent with patient: 20 minutes  *  Past Medical History:  Past Medical History  Diagnosis Date  . Migraine   . Hypertension   . Hypercholesterolemia   . Depression   . Morbid obesity ([name])   . Sleep apnea     no CPAP machine   . H/O hiatal hernia   . GERD (gastroesophageal reflux disease)     hx of   . Arthritis     hips, knees and hands   . Anxiety   . PTSD (post-traumatic stress disorder)     Past Surgical History  Procedure Laterality Date  . Foot surgery    . Breath tek h pylori  05/03/2012    Procedure: BREATH TEK H PYLORI;  Surgeon: [name], MD;  Location: [name];  Service: General;  Laterality: N/A;  . Right tube and ovary removed     . Abdominal hysterectomy  1997 & 2006  . Colonoscopy      hx of benign polyps   . 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 Upper gi endoscopy N/A 07/05/2012    Procedure: UPPER GI ENDOSCOPY;  Surgeon: [name], MD;  Location: [facility];  Service: General;  Laterality: N/A;  . Laparoscopic lysis of adhesions N/A 07/05/2012    Procedure: LAPAROSCOPIC LYSIS OF ADHESIONS;  Surgeon: [name], MD;  Location: [facility];  Service: General;  Laterality: N/A;  repair of abdominal wall hernia  . Irrigation and debridement abscess N/A 01/01/2014    Procedure: IRRIGATION AND DEBRIDEMENT ABSCESS;  Surgeon: [name], MD;  Location: [facility];  Service:  General;  Laterality: N/A;  . Gastric bypass     Family History:  Family History  Problem Relation Age of Onset  . Cancer Mother     colon  . Cancer Maternal Aunt     breast  . Depression Father   . Depression Sister   . Post-traumatic stress disorder Sister   . Alcohol abuse Paternal Uncle   . Bipolar disorder Cousin   . Anxiety disorder Sister     Social History:  History  Alcohol Use No    Comment: Occasional use     History  Drug Use No    Social History   Social History  . Marital Status: Married    Spouse Name: N/A  . Number of Children: N/A  . Years of Education: N/A   Social History Main Topics  . Smoking status: Former Smoker    Types: Cigars    Quit date: 04/27/2004  . Smokeless tobacco: Never Used  . Alcohol Use: No     Comment: Occasional use  . Drug Use: No  . Sexual Activity: Yes   Other Topics Concern  . None   Social History Narrative   Additional Social History:   Sleep: improved   Appetite:  Good  Current Medications: Current Facility-Administered Medications  Medication Dose Route Frequency Provider Last Rate Last Dose  . acetaminophen (TYLENOL) tablet 650 mg  650 mg Oral Q6H PRN Patrecia Pour, NP      . calcium-vitamin D (OSCAL WITH D) 500-200 MG-UNIT per tablet 3 tablet  3 tablet Oral q morning - 10a Patrecia Pour, NP   3 tablet at 09/16/15 1027  . cholecalciferol (VITAMIN D) tablet 1,000 Units  1,000 Units Oral Daily Patrecia Pour, NP   1,000 Units at 09/16/15 0802  . cyanocobalamin tablet 500 mcg  500 mcg Oral Daily Patrecia Pour, NP   500 mcg at 09/16/15 0802  . DULoxetine (CYMBALTA) DR capsule 60 mg  60 mg Oral BID Patrecia Pour, NP   60 mg at 09/16/15 0803  . loratadine (CLARITIN) tablet 10 mg  10 mg Oral Daily Patrecia Pour, NP   10 mg at 09/16/15 0802  . magnesium hydroxide (MILK OF MAGNESIA) suspension 30 mL  30 mL Oral Daily PRN Patrecia Pour, NP      . multivitamin with minerals tablet 1 tablet  1 tablet Oral Daily  Patrecia Pour, NP   1 tablet at 09/16/15 0802  . QUEtiapine (SEROQUEL) tablet 150 mg  150 mg Oral QHS Nanci Pina, FNP   150 mg at 09/15/15 2139  . tobramycin-dexamethasone (TOBRADEX) ophthalmic suspension 1 drop  1 drop Both Eyes QID Patrecia Pour, NP   1 drop at 09/16/15 1145    Lab Results: No results found for this or any previous visit (from the past 48 hour(s)).  Blood Alcohol level:  Lab Results  Component Value Date   [facility] <5 09/11/2015   ETH <11 11/22/2013    Physical Findings: AIMS: Facial and Oral Movements Muscles of  Facial Expression: None, normal Lips and Perioral Area: None, normal Jaw: None, normal Tongue: None, normal,Extremity Movements Upper (arms, wrists, hands, fingers): None, normal Lower (legs, knees, ankles, toes): None, normal, Trunk Movements Neck, shoulders, hips: None, normal, Overall Severity Severity of abnormal movements (highest score from questions above): None, normal Incapacitation due to abnormal movements: None, normal Patient's awareness of abnormal movements (rate only patient's report): No Awareness, Dental Status Current problems with teeth and/or dentures?: No Does patient usually wear dentures?: No  CIWA:  CIWA-Ar Total: 1 COWS:  COWS Total Score: 2  Musculoskeletal: Strength & Muscle Tone: within normal limits Gait & Station: normal Patient leans: N/A  Psychiatric Specialty Exam: Physical Exam  Nursing note and vitals reviewed. Constitutional: She is oriented to person, place, and time. She appears well-developed and well-nourished.  Cardiovascular: Normal rate and regular rhythm.   Neurological: She is alert and oriented to person, place, and time.  Skin: Skin is warm and dry.  Psychiatric: She has a normal mood and affect. Her behavior is normal. Thought content normal.    Review of Systems  Psychiatric/Behavioral: Positive for depression. The patient is nervous/anxious.   All other systems reviewed and are negative.   no headaches, no chest pain, no shortness of breath, no vomiting   Blood pressure 112/67, pulse 77, temperature 98.1 F (36.7 C), temperature source Oral, resp. rate 16, height 5\' 10"  (1.778 m), weight 102.513 kg (226 lb).Body mass index is 32.43 kg/(m^2).  General Appearance: Casual and Well Groomed  Eye Contact:  Good  Speech:  Normal Rate  Volume:  Normal  Mood:  still depressed, but improving   Affect:  Appropriate  Thought Process:  Linear  Orientation:  Full (Time, Place, and Person)  Thought Content:  denies hallucinations, no delusions   Suicidal Thoughts:  No- at this time denies any suicidal ideations, denies any self injurious ideations   Homicidal Thoughts:  No denies any homicidal ideations  Memory:  Immediate;   Fair Recent;   Fair Remote;   Fair,   Judgement:  improving  Insight:  Present  Psychomotor Activity:  Normal  Concentration:  Concentration: Good and Attention Span: Good  Recall:  Good  Fund of Knowledge:  Good  Language:  Good  Akathisia:  Negative  Handed:  Right  AIMS (if indicated):     Assets:  Desire for Improvement Resilience  ADL's:  Intact  Cognition:  WNL  Sleep:  Number of Hours: 6.75    I agree with current treatment plan on 06/04/017, Patient seen face-to-face for psychiatric evaluation follow-up, chart reviewed. Reviewed the information documented and agree with the treatment plan.  Treatment Plan Summary: Daily contact with patient to assess and evaluate symptoms and progress in treatment, Medication management, Plan inpatient treatment  and medications as below   Continue inpatient treatment, continue to encourage group and milieu participation to work on coping skills and symptom reduction Continue Cymbalta 60 mg po  BID for depression Continue Seroquel 150 mg po QHS for mood disorder. Treatment team working on disposition planning    [place], NP 09/16/2015, 1:47 PM  I reviewed chart and agreed with the findings and treatment  Plan.  Berniece Andreas, MD

## 2015-09-16 NOTE — [facility] Group Notes (Signed)
[facility] Group Notes:  (Clinical Social Work)   09/16/2015   1:15-2:15PM  Summary of Progress/Problems:   The main focus of today's process group was to   1)  Examine first impressions;  2)  Discuss what characteristics, beyond people's first impressions, are important to know;  3)  Look at the supports being viewed differently when only the superficial first impressions are considered;  4)  Attempt to integrate a broader view of supports, accepting the limits of support they may offer; and  5)  Acknowledge the need to increase professional supports.  The patient expressed her thoughts throughout group and was very supportive and uplifting to others.  She adamantly refused to go through the exercise herself and was given support and encouragement for having appropriate boundaries by CSW.    Type of Therapy:  Process Group with Motivational Interviewing  Participation Level:  Active  Participation Quality:  Attentive, Sharing and Supportive  Affect:  Blunted and Depressed  Cognitive:  Alert and Appropriate  Insight:  Engaged and Supportive  Engagement in Therapy:  Engaged  Modes of Intervention:   Education, Support and Processing, Activity  Selmer Dominion, LCSW 09/16/2015    4:17 PM

## 2015-09-16 NOTE — Progress Notes (Signed)
D: Pt continues to be very flat and depressed on the unit today. Pt also been visible in the day area interacting well peers. Pt attended and participated in the group. Pt has been cooperative, calm and followed unit rules. Medications given as scheduled. Pt reported that her depression was a 3, her hopelessness was a 3, and that her anxiety was a 4. Pt reported being negative SI/HI, no AH/VH noted. A: 15 min checks continued for patient safety. R: Pts safety maintained.

## 2015-09-16 NOTE — Progress Notes (Signed)
[facility] Group Notes:  (Nursing/MHT/Case Management/Adjunct)  Date:  09/16/2015  Time:  11:42 PM  Type of Therapy:  Psychoeducational Skills  Participation Level:  Minimal  Participation Quality:  Attentive  Affect:  Blunted  Cognitive:  Appropriate  Insight:  Limited  Engagement in Group:  Limited  Modes of Intervention:  Education  Summary of Progress/Problems: The patient described his day as having been a "mixed day" but did not go into further detail. As for the theme of the day, her support system will consist of her mother, sister, and daughter.   Archie Balboa S 09/16/2015, 11:42 PM

## 2015-09-16 NOTE — [facility] Group Notes (Signed)
[facility] Group Notes:  (Nursing/MHT/Case Management/Adjunct)  Date:  09/16/2015  Time:  11:01 AM  Type of Therapy:  Psychoeducational Skills  Participation Level:  Active  Participation Quality:  Appropriate  Affect:  Appropriate  Cognitive:  Appropriate  Insight:  Appropriate  Engagement in Group:  Engaged  Modes of Intervention:  Problem-solving  Summary of Progress/Problems:  Topic was on self care, Pt sated self care is "Putting self a head of others."   Brittany [name] 09/16/2015, 11:01 AM

## 2015-09-17 ENCOUNTER — Ambulatory Visit (HOSPITAL_COMMUNITY): Payer: Self-pay | Admitting: Clinical

## 2015-09-17 MED ORDER — VITAMIN B-12 500 MCG PO TABS
500.0000 ug | ORAL_TABLET | Freq: Every day | ORAL | Status: AC
Start: 2015-09-17 — End: ?

## 2015-09-17 MED ORDER — DULOXETINE HCL 60 MG PO CPEP: 60.0000 mg | ORAL_CAPSULE | Freq: Two times a day (BID) | ORAL | Status: DC

## 2015-09-17 MED ORDER — VITAMIN D3 25 MCG (1000 UNIT) PO TABS: 1000.0000 [IU] | ORAL_TABLET | Freq: Every day | ORAL | Status: AC

## 2015-09-17 MED ORDER — QUETIAPINE FUMARATE 50 MG PO TABS: 150.0000 mg | ORAL_TABLET | Freq: Every day | ORAL | Status: DC

## 2015-09-17 MED ORDER — MULTI-VITAMIN/MINERALS PO TABS: 1.0000 | ORAL_TABLET | Freq: Every day | ORAL | Status: DC

## 2015-09-17 MED ORDER — ACETAMINOPHEN 500 MG PO TABS: 1000.0000 mg | ORAL_TABLET | Freq: Four times a day (QID) | ORAL | Status: DC | PRN

## 2015-09-17 MED ORDER — LORATADINE 10 MG PO TABS: 10.0000 mg | ORAL_TABLET | Freq: Every day | ORAL | Status: DC

## 2015-09-17 MED ORDER — CALCIUM CARBONATE-VITAMIN D 500-200 MG-UNIT PO TABS: 3.0000 | ORAL_TABLET | Freq: Every morning | ORAL | Status: DC

## 2015-09-17 MED ORDER — TOBRAMYCIN-DEXAMETHASONE 0.3-0.1 % OP SUSP: 1.0000 [drp] | Freq: Four times a day (QID) | OPHTHALMIC | Status: DC

## 2015-09-17 NOTE — Progress Notes (Signed)
  [facility] Adult Case Management Discharge Plan :  Will you be returning to the same living situation after discharge:  Yes,  Pt returning home At discharge, do you have transportation home?: Yes,  Pt car is here at the [place]. Do you have the ability to pay for your medications: Yes,  Pt provided with prescriptions  Release of information consent forms completed and in the chart;  Patient's signature needed at discharge.  Patient to Follow up at: Follow-up Information    Follow up with [facility] On 09/19/2015.   Specialty:  Behavioral Health   Why:  at 9:00am to meet with Velva Harman and start the IOP program. After this please continue following-up with Dr. Adele Schilder and [name] for therapy   Contact information:   [address] [id number] [place] [phone]      Next level of care provider has access to [place] and Suicide Prevention discussed: Yes,  with Pt; declines family contact  Have you used any form of tobacco in the last 30 days? (Cigarettes, Smokeless Tobacco, Cigars, and/or Pipes): No  Has patient been referred to the Quitline?: N/A patient is not a smoker  Patient has been referred for addiction treatment: N/A  Bo Mcclintock 09/17/2015, 10:19 AM

## 2015-09-17 NOTE — [facility] Suicide Risk Assessment (Addendum)
[facility] Discharge Suicide Risk Assessment   Principal Problem: <principal problem not specified> Discharge Diagnoses:  Patient Active Problem List   Diagnosis Date Noted  . Severe episode of recurrent major depressive disorder, without psychotic features ([place]) [F33.2]   . Depression, major, severe recurrence ([place]) [F33.2] 09/11/2015  . Major depressive disorder, recurrent, severe without psychotic features ([place]) [F33.2]   . MDD (major depressive disorder) ([place]) [F32.9] 10/30/2014  . MDD (major depressive disorder), recurrent severe, without psychosis ([place]) [F33.2]   . Suicidal ideation [R45.851] 10/05/2014  . Depression [F32.9] 06/12/2014  . Rash [R21] 01/04/2014  . Normocytic anemia [D64.9] 01/04/2014  . DJD (degenerative joint disease) [M19.90] 01/04/2014  . Hypertension [I10] 01/04/2014  . Dyslipidemia [E78.5] 01/04/2014  . GERD (gastroesophageal reflux disease) [K21.9] 01/04/2014  . Obstructive sleep apnea [G47.33] 01/04/2014  . 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 Abdominal abscess ([place]) [K65.1] 01/01/2014  . Wound infection after surgery [T81.4XXA] 01/01/2014  . PTSD (post-traumatic stress disorder) [F43.10] 11/23/2013  . Severe recurrent major depression without psychotic features ([place]) [F33.2] 11/22/2013  . 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 Morbid obesity, Weight - 312, BMI - 45.2 [E66.01] 04/21/2012    Total Time spent with patient: 30 minutes  Musculoskeletal: Strength & Muscle Tone: within normal limits Gait & Station: normal Patient leans: N/A  Psychiatric Specialty Exam: ROS  Blood pressure 121/75, pulse 80, temperature 97.9 F (36.6 C), temperature source Oral, resp. rate 16, height 5\' 10"  (1.778 m), weight 226 lb (102.513 kg).Body mass index is 32.43 kg/(m^2).  General Appearance: Well Groomed  Eye Contact::  Good  Speech:  Normal Rate409  Volume:  Normal  Mood:  improved mood, feels less depressed   Affect:  Appropriate and smiles at times appropriately   Thought Process:  Linear  Orientation:  Full (Time, Place, and Person)  Thought Content:  denies hallucinations, no delusions, not internally preoccupied, still ruminative about marital separation issues   Suicidal Thoughts:  No- denies any suicidal ideations, denies any self injurious ideations, denies any violent or homicidal ideations   Homicidal Thoughts:  No  Memory:  recent and remote grossly intact   Judgement:  Other:  improving  Insight:  improving  Psychomotor Activity:  Normal  Concentration:  Good  Recall:  Good  Fund of Knowledge:Good  Language: Good  Akathisia:  Negative  Handed:  Right  AIMS (if indicated):   no current akathisia, no abnormal involuntary movements noted  Assets:  Desire for Improvement Resilience Others:  employed   Sleep:  Number of Hours: 6  Cognition: WNL  ADL's:  Intact   Mental Status Per Nursing Assessment::   On Admission:  Suicidal ideation indicated by patient, Suicide plan  Demographic Factors:  50 year old married female, employed   Loss Factors: Marital discord, separation recently.  Was living with mother in law who has dementia. Also describes stressors at work   Historical Factors: history of mood disorder, depression, prior psychiatric admissions  Risk Reduction Factors:   employed, resilience   Continued Clinical Symptoms:  At this time patient is improved compared to admission - mood is improving, affect is more reactive, she denies any suicidal ideations, denies any homicidal or violent ideations, no hallucinations, no delusions- is future oriented and is planning on attending IOP and returning to work in 2-3 weeks. She is still ruminative about her separation ( states that husband left the home a few days before she was admitted ) . States she  is hoping they will be able to get back together and preserve marriage but states that even if the marriage fails she knows  she is independent , employed, and " I will do OK either way".  Cognitive Features That Contribute To Risk:  No gross cognitive deficits noted upon discharge. Is alert , attentive, and oriented x 3 .  Suicide Risk:  Mild:  Suicidal ideation of limited frequency, intensity, duration, and specificity.  There are no identifiable plans, no associated intent, mild dysphoria and related symptoms, good self-control (both objective and subjective assessment), few other risk factors, and identifiable protective factors, including available and accessible social support.  Follow-up Information    Follow up with [facility] On 09/19/2015.   Specialty:  Behavioral Health   Why:  at 9:00am to meet with Velva Harman and start the IOP program. After this please continue following-up with Dr. Adele Schilder and [name] for therapy   Contact information:   [address] [id number] [place] [phone]      Plan Of Care/Follow-up recommendations:  Activity:  as tolerated  Diet:  Regular Tests:  NA Other:  See below  Patient is leaving unit in good spirits Plans to return home  Plans to go participation in [place], [place], MD 09/17/2015, 12:04 PM

## 2015-09-17 NOTE — Progress Notes (Signed)
Writer spoke with patient 1:1 and she reports that she may discharge on tomorrow. She reports that she will hopefully start outpatient on Wednesday and her husband has moved out of their home with his mother. She reports that she has a daughter and her husband as a good support system and they live close by. She denies si/hi/a/v hallucinations. Support given and safety maintained on unit with 15 min checks.

## 2015-09-17 NOTE — Tx Team (Signed)
Interdisciplinary Treatment Plan Update (Adult) Date: 09/17/2015   Date: 09/17/2015 10:15 AM  Progress in Treatment:  Attending groups: Yes  Participating in groups: Yes  Taking medication as prescribed: Yes  Tolerating medication: Yes  Family/Significant othe contact made: No, Pt declines Patient understands diagnosis: Yes AEB seeking help for depression and anxiety Discussing patient identified problems/goals with staff: Yes  Medical problems stabilized or resolved: Yes  Denies suicidal/homicidal ideation: Yes Patient has not harmed self or Others: Yes   New problem(s) identified: None identified at this time.   Discharge Plan or Barriers: Pt will return home and follow-up with [facility]- will start IOP on Wednesday  Additional comments:  Patient and CSW reviewed pt's identified goals and treatment plan. Patient verbalized understanding and agreed to treatment plan. CSW reviewed [facility] "Discharge Process and Patient Involvement" Form. Pt verbalized understanding of information provided and signed form.   Reason for Continuation of Hospitalization:  Anxiety Depression Medication stabilization Suicidal ideation  Estimated length of stay: 0 days  Review of initial/current patient goals per problem list:   1.  Goal(s): Patient will participate in aftercare plan  Met:  Yes  Target date: 3-5 days from date of admission   As evidenced by: Patient will participate within aftercare plan AEB aftercare provider and housing plan at discharge being identified.  09/12/15: Pt will return home and follow-up with [facility] Outpatient. Pt considering IOP  2.  Goal (s): Patient will exhibit decreased depressive symptoms and suicidal ideations.  Met:  Yes  Target date: 3-5 days from date of admission   As evidenced by: Patient will utilize self rating of depression at 3 or below and demonstrate decreased signs of depression or be deemed stable for discharge by MD. 09/12/15: Pt was admitted with  symptoms of depression, rating 9/10. Pt continues to present with flat affect and depressive symptoms.  Pt will demonstrate decreased symptoms of depression and rate depression at 3/10 or lower prior to discharge. 09/17/2015: Pt rates depression at 3/10; denies SI  3.  Goal(s): Patient will demonstrate decreased signs and symptoms of anxiety.  Met:  Adequate for DC  Target date: 3-5 days from date of admission   As evidenced by: Patient will utilize self rating of anxiety at 3 or below and demonstrated decreased signs of anxiety, or be deemed stable for discharge by MD 09/12/15: Pt was admitted with increased levels of anxiety and is currently rating those symptoms highly. Pt will demonstrated decreased symptoms of anxiety and rate it at 3/10 prior to d/c. 09/17/2015: MD feels that Pt's symptoms have decreased to the point that they can be managed in an outpatient setting.  Attendees:  Patient:    Family:    Physician: Dr. Parke Poisson, MD  09/17/2015 10:15 AM  Nursing: Lars Pinks, RN Case manager  09/17/2015 10:15 AM  Clinical Social Worker Peri Maris, [place] 09/17/2015 10:15 AM  Other: Tilden Fossa, [place] 09/17/2015 10:15 AM  Clinical: Eulogio Bear, RN 09/17/2015 10:15 AM  Other: , RN Charge Nurse 09/17/2015 10:15 AM  Other:     Peri Maris, [place] Social Work [phone]

## 2015-09-17 NOTE — Discharge Summary (Signed)
Physician Discharge Summary Note  Patient:  Brittany [name] is an 50 y.o., female MRN:  [id number] DOB:  Jan 29, 1966  Patient phone:  [phone] (home)   Patient address:   [address] [place] [address],   Total Time spent with patient: 30 minutes  Date of Admission:  09/11/2015  Date of Discharge: 09-17-15  Reason for Admission:  Admitted for major depression severe   Principal Problem: Depression, major, severe recurrence [facility])  Discharge Diagnoses: Patient Active Problem List   Diagnosis Date Noted  . Severe episode of recurrent major depressive disorder, without psychotic features ([place]) [F33.2]   . Depression, major, severe recurrence ([place]) [F33.2] 09/11/2015  . Major depressive disorder, recurrent, severe without psychotic features ([place]) [F33.2]   . MDD (major depressive disorder) ([place]) [F32.9] 10/30/2014  . MDD (major depressive disorder), recurrent severe, without psychosis ([place]) [F33.2]   . Suicidal ideation [R45.851] 10/05/2014  . Depression [F32.9] 06/12/2014  . Rash [R21] 01/04/2014  . Normocytic anemia [D64.9] 01/04/2014  . DJD (degenerative joint disease) [M19.90] 01/04/2014  . Hypertension [I10] 01/04/2014  . Dyslipidemia [E78.5] 01/04/2014  . GERD (gastroesophageal reflux disease) [K21.9] 01/04/2014  . Obstructive sleep apnea [G47.33] 01/04/2014  . 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 Abdominal abscess ([place]) [K65.1] 01/01/2014  . Wound infection after surgery [T81.4XXA] 01/01/2014  . PTSD (post-traumatic stress disorder) [F43.10] 11/23/2013  . Severe recurrent major depression without psychotic features ([place]) [F33.2] 11/22/2013  . 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 Morbid obesity, Weight - 312, BMI - 45.2 [E66.01] 04/21/2012   Musculoskeletal: Strength & Muscle Tone: within normal limits Gait & Station: normal Patient leans: N/A  Psychiatric Specialty Exam: Physical Exam  Constitutional: She  appears well-developed and well-nourished.  HENT:  Head: Normocephalic and atraumatic.  Eyes: Conjunctivae are normal. Pupils are equal, round, and reactive to light.  Neck: Normal range of motion.  Cardiovascular: Normal heart sounds.   Respiratory: Effort normal.  GI: Soft.  Genitourinary:  Denies any issues in this area  Musculoskeletal: Normal range of motion.  Neurological: She is alert.  Skin: Skin is warm and dry.  Psychiatric: She has a normal mood and affect. Her speech is normal and behavior is normal. Judgment and thought content normal. She exhibits abnormal recent memory.    Review of Systems  Constitutional: Negative.   HENT: Negative.   Eyes: Negative.   Respiratory: Negative.   Cardiovascular: Negative.   Gastrointestinal: Negative.   Musculoskeletal: Negative.   Skin: Negative.   Neurological: Negative.   Psychiatric/Behavioral: Positive for depression (Stable). Negative for suicidal ideas, hallucinations, memory loss and substance abuse. The patient has insomnia (Stable). The patient is not nervous/anxious.     Blood pressure 121/75, pulse 80, temperature 97.9 F (36.6 C), temperature source Oral, resp. rate 16, height 5\' 10"  (1.778 m), weight 102.513 kg (226 lb).Body mass index is 32.43 kg/(m^2).  See Md's SRA   Have you used any form of tobacco in the last 30 days? (Cigarettes, Smokeless Tobacco, Cigars, and/or Pipes): No  Has this patient used any form of tobacco in the last 30 days? (Cigarettes, Smokeless Tobacco, Cigars, and/or Pipes) Yes, A prescription for an FDA-approved tobacco cessation medication was offered at discharge and the patient refused  Past Medical History:  Past Medical History  Diagnosis Date  . Migraine   . Hypertension   . Hypercholesterolemia   . Depression   . Morbid obesity ([place])   . Sleep apnea  no CPAP machine   . H/O hiatal hernia   . GERD (gastroesophageal reflux disease)     hx of   . Arthritis     hips, knees and  hands   . Anxiety   . PTSD (post-traumatic stress disorder)     Past Surgical History  Procedure Laterality Date  . Foot surgery    . Breath tek h pylori  05/03/2012    Procedure: BREATH TEK H PYLORI;  Surgeon: [name], MD;  Location: [name];  Service: General;  Laterality: N/A;  . Right tube and ovary removed     . Abdominal hysterectomy  1997 & 2006  . Colonoscopy      hx of benign polyps   . 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 Upper gi endoscopy N/A 07/05/2012    Procedure: UPPER GI ENDOSCOPY;  Surgeon: [name], MD;  Location: [facility];  Service: General;  Laterality: N/A;  . Laparoscopic lysis of adhesions N/A 07/05/2012    Procedure: LAPAROSCOPIC LYSIS OF ADHESIONS;  Surgeon: [name], MD;  Location: [facility];  Service: General;  Laterality: N/A;  repair of abdominal wall hernia  . Irrigation and debridement abscess N/A 01/01/2014    Procedure: IRRIGATION AND DEBRIDEMENT ABSCESS;  Surgeon: [name], MD;  Location: [facility];  Service: General;  Laterality: N/A;  . Gastric bypass     Family History:  Family History  Problem Relation Age of Onset  . Cancer Mother     colon  . Cancer Maternal Aunt     breast  . Depression Father   . Depression Sister   . Post-traumatic stress disorder Sister   . Alcohol abuse Paternal Uncle   . Bipolar disorder Cousin   . Anxiety disorder Sister    Social History:  History  Alcohol Use No    Comment: Occasional use     History  Drug Use No    Social History   Social History  . Marital Status: Married    Spouse Name: N/A  . Number of Children: N/A  . Years of Education: N/A   Social History Main Topics  . Smoking status: Former Smoker    Types: Cigars    Quit date: 04/27/2004  . Smokeless tobacco: Never Used  . Alcohol Use: No     Comment: Occasional use  . Drug Use: No   . Sexual Activity: Yes   Other Topics Concern  . None   Social History Narrative   Past Psychiatric History: Major depressive disorder, recurrent episodes  Level of Care:  OP  Hospital Course:  Birdell is 50year old female who lives in a single family home with her husband and her mother in Sports coach. She has two step daughters ( 26 and 84). She completed her junior year in college. Everything is going to hell. I been fighting this depression and anxiety for years. My husband is leaving me after 25 years. My car is at [employer] and it is going to get towed and he wont even answer my phone calls. He said he is done with me. He is tired of my mood swings and outbursts. It seems like everybody in my life is walking out on me. I have no close friends, my coworkers Scientist, research (physical sciences) even talk to me. I confronted people who dont have good work Nurse, learning disability, and people  dont like to hear it. But I completely blew up on Sunday which is what lead to me coming in here. My husbands mother has dementia, and she treats him nicely but is very aggressive towards me and this has been ongoing. Their resolution was for me to be patient with her but it has been 25 years. So I was very cruel to her, and I can't say that if I were to see her again that she would be safe in my care. My husband packed up his things and her belongings & left. They went to his sister's house. So I guess when I go home that I will be home alone. I recently was receiving ECT treatment at the [facility]. We stopped it because my memory was so bad and I was getting lost.   Upon her arrival & admision to the adult unit, Farrin was evaluated & her presenting symptoms identified. The medication management for the presenting symptoms were discussed & with her consent initiated targeting those presenting symptoms. Arnita's mental illness is chronic. It has failed to respond positively to several other antidepressants including ECT. Her behavior which includes anger, outburst &  frustrations has affected her family & work relationships,she reported on admission. Emeline was admitted to [facility] adult unit for another try on mood stabilization treatments. She was medicated & discharged on; Duloxetine DR 60 mg for depression & Seroquel 150 mg for mood control. She was resumed on all her pertinent home medications for her other pre-existing medical issues. Litzy was enrolled in the group counseling sessions & encouraged to participate in the unit programming.   During the course of her treatment, Aysia was evaluated on daily basis by the clinical providers to assure her response to her treatment regimen.As her treatment progressed, improvement was noted as evidenced by her report of decreasing symptoms, improved mood, medication tolerance & active participation in the unit programming. She was encouraged to update her providers on her progress by daily completion of a self-inventory assessment, noting mood, mental status, pain, any new symptoms, anxiety and or concerns.  Dashanique's symptoms responded well to her treatment regimen, to some degree combined with a therapeutic and supportive environment. She was motivated for recovery as evidenced by a positive/appropriate behavior and her interaction with the staff & fellow patients.Upon discharge, Sami was in much improved condition than upon admission.Her symptoms were reported as significantly decreased. She adamantly denies any SI/HI,  AVH, delusional thoughts & or paranoia. She was motivated to continue taking medication with a goal of continued improvement in mental health. Deasia will continue psychiatric care on an outpatient basis as noted below. She is provided with all the necessary information required to make this appointment without problems. She left [facility] with all personal belongings in no apparent distress.  Consults:  psychiatry  Discharge Vitals:   Blood pressure 121/75, pulse 80, temperature 97.9 F (36.6 C), temperature source  Oral, resp. rate 16, height 5\' 10"  (1.778 m), weight 102.513 kg (226 lb). Body mass index is 32.43 kg/(m^2). Lab Results:   No results found for this or any previous visit (from the past 72 hour(s)).  Physical Findings: AIMS: Facial and Oral Movements Muscles of Facial Expression: None, normal Lips and Perioral Area: None, normal Jaw: None, normal Tongue: None, normal,Extremity Movements Upper (arms, wrists, hands, fingers): None, normal Lower (legs, knees, ankles, toes): None, normal, Trunk Movements Neck, shoulders, hips: None, normal, Overall Severity Severity of abnormal movements (highest score from questions above): None, normal Incapacitation due to  abnormal movements: None, normal Patient's awareness of abnormal movements (rate only patient's report): No Awareness, Dental Status Current problems with teeth and/or dentures?: No Does patient usually wear dentures?: No  CIWA:  CIWA-Ar Total: 1 COWS:  COWS Total Score: 2  See Psychiatric Specialty Exam and Suicide Risk Assessment completed by Attending Physician prior to discharge.  Discharge destination:  Home  Is patient on multiple antipsychotic therapies at discharge:  No   Has Patient had three or more failed trials of antipsychotic monotherapy by history:  No  Recommended Plan for Multiple Antipsychotic Therapies: NA    Medication List    STOP taking these medications        hydrOXYzine 25 MG capsule  Commonly known as:  VISTARIL      TAKE these medications      Indication   acetaminophen 500 MG tablet  Commonly known as:  TYLENOL  Take 2 tablets (1,000 mg total) by mouth every 6 (six) hours as needed for mild pain or headache.   Indication:  Pain, Headaches     calcium-vitamin D 500-200 MG-UNIT tablet  Commonly known as:  OSCAL WITH D  Take 3 tablets by mouth every morning. For bone health   Indication:  Low Amount of Calcium in the Blood     cholecalciferol 1000 units tablet  Commonly known as:   VITAMIN D  Take 1 tablet (1,000 Units total) by mouth daily. For bone health   Indication:  Calcium supplement     DULoxetine 60 MG capsule  Commonly known as:  CYMBALTA  Take 1 capsule (60 mg total) by mouth 2 (two) times daily. For depression   Indication:  Major Depressive Disorder     loratadine 10 MG tablet  Commonly known as:  CLARITIN  Take 1 tablet (10 mg total) by mouth daily. For allergies   Indication:  Hayfever     multivitamin with minerals tablet  Take 1 tablet by mouth daily. For low vitamin   Indication:  Vitamin supplement     QUEtiapine 50 MG tablet  Commonly known as:  SEROQUEL  Take 3 tablets (150 mg total) by mouth at bedtime. For mood control   Indication:  Mood control     tobramycin-dexamethasone ophthalmic solution  Commonly known as:  TOBRADEX  Place 1 drop into both eyes 4 (four) times daily. For eye irritation   Indication:  Inflammation of the Eye, Bacterial Infection on the Surface of the Eye     vitamin B-12 500 MCG tablet  Commonly known as:  CYANOCOBALAMIN  Take 1 tablet (500 mcg total) by mouth daily. For Vitamin B-12 replacement   Indication:  Vitamin B12 Absorption Study       Follow-up Information    Follow up with [facility] On 09/19/2015.   Specialty:  Behavioral Health   Why:  at 9:00am to meet with Velva Harman and start the IOP program. After this please continue following-up with Dr. Adele Schilder and [name] for therapy   Contact information:   [address] [phone] [place] [phone]     Follow-up recommendations:  Activity:  As tolerated Diet: As recommended by your primary care doctor. Keep all scheduled follow-up appointments as recommended.  Comments:  Patient is instructed prior to discharge to: Take all medications as prescribed by his/her mental healthcare provider. Report any adverse effects and or reactions from the medicines to his/her outpatient provider  promptly. Patient has been instructed & cautioned: To not engage in  alcohol and or illegal drug use while on prescription medicines. In the event of worsening symptoms, patient is instructed to call the crisis hotline, 911 and or go to the nearest ED for appropriate evaluation and treatment of symptoms. To follow-up with his/her primary care provider for your other medical issues, concerns and or health care needs  Signed: Encarnacion Slates, PMHNP, FNP-BC 09/17/2015, 11:12 AM  Patient seen, Suicide Assessment Completed.  Disposition Plan Reviewed

## 2015-09-17 NOTE — Progress Notes (Signed)
Pt discharged home with her car. Pt was ambulatory, stable and appreciative at that time. All papers and prescriptions were given and valuables returned. Verbal understanding expressed. Denies SI/HI and A/VH. Pt given opportunity to express concerns and ask questions.

## 2015-09-19 ENCOUNTER — Other Ambulatory Visit (HOSPITAL_COMMUNITY): Payer: 59 | Attending: Psychiatry | Admitting: Psychiatry

## 2015-09-19 ENCOUNTER — Encounter (HOSPITAL_COMMUNITY): Payer: Self-pay | Admitting: Psychiatry

## 2015-09-19 DIAGNOSIS — F332 Major depressive disorder, recurrent severe without psychotic features: Secondary | ICD-10-CM | POA: Diagnosis not present

## 2015-09-19 DIAGNOSIS — I10 Essential (primary) hypertension: Secondary | ICD-10-CM | POA: Insufficient documentation

## 2015-09-19 DIAGNOSIS — Z87891 Personal history of nicotine dependence: Secondary | ICD-10-CM | POA: Insufficient documentation

## 2015-09-19 DIAGNOSIS — E7800 Pure hypercholesterolemia, unspecified: Secondary | ICD-10-CM | POA: Diagnosis not present

## 2015-09-19 DIAGNOSIS — G4700 Insomnia, unspecified: Secondary | ICD-10-CM | POA: Diagnosis not present

## 2015-09-19 DIAGNOSIS — F4310 Post-traumatic stress disorder, unspecified: Secondary | ICD-10-CM | POA: Insufficient documentation

## 2015-09-19 DIAGNOSIS — K219 Gastro-esophageal reflux disease without esophagitis: Secondary | ICD-10-CM | POA: Diagnosis not present

## 2015-09-19 NOTE — Progress Notes (Signed)
Psychiatric Initial Adult Assessment   Patient Identification: Brittany [name] MRN:  [id number] Date of Evaluation:  09/19/2015 Referral Source: inpatient psychiatric [facility] Chief Complaint: depression  Visit Diagnosis: Major depression, recurrent, severe without psyshosis  History of Present Illness:  Brittany [name] said she went through ECT and that has helped her depression but the memory issues have been major.  This current episode started after she had a blow out with her mother in law who has dementia probably in the early/middle stages.  She felt bad about yelling at her but has had a harder time than she thought she would dealing with her mother in law living with them as she has been pretty mean to her as well.  Her husband blamed her and abruptly moved out  Sending his mother to his sister.  He plans to end the marriage after 25 years.  She recalls his having an affair 3 years into the marriage and her forgiving him and working things out.  This seems worse to her than that but there seems no room to work things out.  She had suicidal thoughts with a plan and even intent but was able to get help.  The suicidal thinking is gone but she still needs support as she figures out what to do next.  She says the abrupt separation was not the main reason for the suicidal thoughts but was more a last straw.  She does see hope for her future and does have support.  Finances are not a problem.  Associated Signs/Symptoms: Depression Symptoms:  depressed mood, insomnia, fatigue, feelings of worthlessness/guilt, difficulty concentrating, hopelessness, impaired memory, anxiety, (Hypo) Manic Symptoms:  Irritable Mood, Anxiety Symptoms:  Excessive Worry, Psychotic Symptoms:  none PTSD Symptoms: Negative  Past Psychiatric History: was a patient in IOP in the past as well as ongoing outpatient therapy and medication for years  Previous Psychotropic Medications: Yes   Substance Abuse History in the last 12  months:  No.  Consequences of Substance Abuse: Negative  Past Medical History:  Past Medical History  Diagnosis Date  . Migraine   . Hypertension   . Hypercholesterolemia   . Depression   . Morbid obesity ([place])   . Sleep apnea     no CPAP machine   . H/O hiatal hernia   . GERD (gastroesophageal reflux disease)     hx of   . Arthritis     hips, knees and hands   . Anxiety   . PTSD (post-traumatic stress disorder)     Past Surgical History  Procedure Laterality Date  . Foot surgery    . Breath tek h pylori  05/03/2012    Procedure: BREATH TEK H PYLORI;  Surgeon: [name], MD;  Location: [name];  Service: General;  Laterality: N/A;  . Right tube and ovary removed     . Abdominal hysterectomy  1997 & 2006  . Colonoscopy      hx of benign polyps   . 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 Upper gi endoscopy N/A 07/05/2012    Procedure: UPPER GI ENDOSCOPY;  Surgeon: [name], MD;  Location: [facility];  Service: General;  Laterality: N/A;  . Laparoscopic lysis of adhesions N/A 07/05/2012    Procedure: LAPAROSCOPIC LYSIS OF ADHESIONS;  Surgeon: [name], MD;  Location: [facility];  Service: General;  Laterality: N/A;  repair of abdominal wall hernia  . Irrigation and debridement abscess N/A 01/01/2014    Procedure: IRRIGATION AND DEBRIDEMENT ABSCESS;  Surgeon: [name], MD;  Location: [facility];  Service: General;  Laterality: N/A;  . Gastric bypass      Family Psychiatric History: none of current relevance  Family History:  Family History  Problem Relation Age of Onset  . Cancer Mother     colon  . Cancer Maternal Aunt     breast  . Depression Father   . Depression Sister   . Post-traumatic stress disorder Sister   . Alcohol abuse Paternal Uncle   . Bipolar disorder Cousin   . Anxiety disorder Sister     Social History:    Social History   Social History  . Marital Status: Married    Spouse Name: N/A  . Number of Children: N/A  . Years of Education: N/A   Social History Main Topics  . Smoking status: Former Smoker    Types: Cigars    Quit date: 04/27/2004  . Smokeless tobacco: Never Used  . Alcohol Use: No     Comment: Occasional use  . Drug Use: No  . Sexual Activity: Yes   Other Topics Concern  . None   Social History Narrative    Additional Social History: none  Allergies:   Allergies  Allergen Reactions  . Amoxicillin Hives    .Marland KitchenHas patient had a PCN reaction causing immediate rash, facial/tongue/throat swelling, SOB or lightheadedness with hypotension: Yes Has patient had a PCN reaction causing severe rash involving mucus membranes or skin necrosis: No Has patient had a PCN reaction that required hospitalization No Has patient had a PCN reaction occurring within the last 10 years: No If all of the above answers are "NO", then may proceed with Cephalosporin use.   . Bactrim [Sulfamethoxazole-Trimethoprim] Hives and Rash  . Lamictal [Lamotrigine] Hives and Rash    Metabolic Disorder Labs: Lab Results  Component Value Date   HGBA1C 5.2 11/03/2014   MPG 108 10/12/2014   MPG 111 06/13/2014   No results found for: PROLACTIN Lab Results  Component Value Date   CHOL 236* 11/03/2014   TRIG 76 11/03/2014   HDL 66 11/03/2014   CHOLHDL 3.6 11/03/2014   VLDL 15 11/03/2014   LDLCALC 155* 11/03/2014   LDLCALC 153* 10/12/2014     Current Medications: Current Outpatient Prescriptions  Medication Sig Dispense Refill  . acetaminophen (TYLENOL) 500 MG tablet Take 2 tablets (1,000 mg total) by mouth every 6 (six) hours as needed for mild pain or headache. 30 tablet 0  . calcium-vitamin D (OSCAL WITH D) 500-200 MG-UNIT tablet Take 3 tablets by mouth every morning. For bone health    . cholecalciferol (VITAMIN D) 1000 units tablet Take 1 tablet (1,000 Units total) by mouth daily. For  bone health    . DULoxetine (CYMBALTA) 60 MG capsule Take 1 capsule (60 mg total) by mouth 2 (two) times daily. For depression 60 capsule 0  . loratadine (CLARITIN) 10 MG tablet Take 1 tablet (10 mg total) by mouth daily. For allergies    . Multiple Vitamins-Minerals (MULTIVITAMIN WITH MINERALS) tablet Take 1 tablet by mouth daily. For low vitamin    . QUEtiapine (SEROQUEL) 50 MG tablet Take 3 tablets (150 mg total) by mouth at bedtime. For mood control 90 tablet 0  . tobramycin-dexamethasone (TOBRADEX) ophthalmic solution Place 1 drop into both eyes 4 (four) times daily. For eye irritation 5 mL  0  . vitamin B-12 (CYANOCOBALAMIN) 500 MCG tablet Take 1 tablet (500 mcg total) by mouth daily. For Vitamin B-12 replacement     No current facility-administered medications for this visit.    Neurologic: Headache: Negative Seizure: Negative Paresthesias:Negative  Musculoskeletal: Strength & Muscle Tone: within normal limits Gait & Station: normal Patient leans: N/A  Psychiatric Specialty Exam: ROS  There were no vitals taken for this visit.There is no weight on file to calculate BMI.  General Appearance: Well Groomed  Eye Contact:  Good  Speech:  Clear and Coherent  Volume:  Normal  Mood:  Depressed  Affect:  Congruent  Thought Process:  Coherent  Orientation:  Full (Time, Place, and Person)  Thought Content:  Negative  Suicidal Thoughts:  No  Homicidal Thoughts:  No  Memory:  Immediate;   Good Recent;   Good Remote;   Good  Judgement:  Good  Insight:  Good  Psychomotor Activity:  Normal  Concentration:  Concentration: Good and Attention Span: Good  Recall:  Good  Fund of Knowledge:Good  Language: Good  Akathisia:  Negative  Handed:  Right  AIMS (if indicated):  0  Assets:  Communication Skills Desire for Improvement Financial Resources/Insurance Housing Leisure Time [place] Talents/Skills Transportation Vocational/Educational   ADL's:  Intact  Cognition: WNL  Sleep:  improved    Treatment Plan [facility] group therapydaily group therapy   Donnelly Angelica, MD 6/7/20171:44 PM

## 2015-09-19 NOTE — Progress Notes (Signed)
Daily Group Progress Note  Program: IOP  Group Time: 10:45-12:00pm  Participation Level: Active  Behavioral Response: Appropriate  Type of Therapy:  Group Therapy  Summary of Progress: Today was pts first day in the program . She introduced herself to the group. Pt presented as talkative, engaged in the group process. Pt participated in a discussion about self-esteem. Pt was able to identify, through an activity, her overall subjective emotional evaluation of her own worth. Pt was encouraged to continue to work on her self esteem by using positive affirmations.

## 2015-09-19 NOTE — Progress Notes (Signed)
    Daily Group Progress Note  Program: IOP  Group Time: 9:00-10:45 a.m.  Participation Level: Active  Behavioral Response: Responsive  Type: Group Therapy  Summary: Client came in at the very end of group. But related with the statements that another patients made about desiring acceptance from his family.     Nancie Neas, LPC

## 2015-09-19 NOTE — Addendum Note (Signed)
Addended by: Sherlyn Hay on: 09/19/2015 04:30 PM   Modules accepted: Medications
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
## 2015-09-20 ENCOUNTER — Other Ambulatory Visit (HOSPITAL_COMMUNITY): Payer: 59 | Admitting: Psychiatry

## 2015-09-20 DIAGNOSIS — F332 Major depressive disorder, recurrent severe without psychotic features: Secondary | ICD-10-CM | POA: Diagnosis not present

## 2015-09-21 ENCOUNTER — Other Ambulatory Visit (HOSPITAL_COMMUNITY): Payer: 59 | Admitting: Psychiatry

## 2015-09-21 DIAGNOSIS — F332 Major depressive disorder, recurrent severe without psychotic features: Secondary | ICD-10-CM

## 2015-09-21 NOTE — Progress Notes (Signed)
    Daily Group Progress Note  Program: IOP    Group Time: 9:00 a.m.-12:00p.m.  Participation Level: Active  Behavioral Response: Engaged Responsive  Type: Group Therapy  Summary: Pt. did well with relating to other Pt. and his grieving process, by sharing experience with her friend. Pt. was responsive to the benefits of "negative" feelings. Therapist addressed how emotions like anxiety, frustration, etc. can be used for good in our life.    Nancie Neas, LPC

## 2015-09-24 ENCOUNTER — Other Ambulatory Visit (HOSPITAL_COMMUNITY): Payer: 59 | Admitting: Psychiatry

## 2015-09-24 DIAGNOSIS — F332 Major depressive disorder, recurrent severe without psychotic features: Secondary | ICD-10-CM | POA: Diagnosis not present

## 2015-09-24 NOTE — Progress Notes (Signed)
    Daily Group Progress Note  Program: IOP  Group Time: 9:00-10:30  Participation Level: Active  Behavioral Response: Appropriate  Type of Therapy:  Group Therapy  Summary of Progress: Pt. Presents as alert, engaged in the group process, depressed. Pt. Discussed her fear about conversation with her husband about ending her marriage. Pt. Expressed sadness and feeling powerless about saving her relationship with her husband.     Group Time: 10:30-12:00  Participation Level:  Active  Behavioral Response: Appropriate  Type of Therapy: Psycho-education Group  Summary of Progress: Pt. Participated in grief and loss with the Chaplain.  Nancie Neas, LPC

## 2015-09-24 NOTE — Progress Notes (Signed)
    Daily Group Progress Note  Program: IOP  Group Time: 9:00-12:00   Participation Level: active   Behavioral Response: engaged   Type of Therapy:   group therapy   Summary of Progress: Patient focused on loss of marriage and hope for renewed relationship with husband.  Patient's grief was normalized by clinician and group.  Patient feels she lost husband and her in-laws due to her mental illness.  Group discussed understanding diagnoses, stigmas that accompany specific diagnoses, and becoming comfortable with diagnoses.  Therapist encouraged client to practice self-care techniques, and provided group with a list of possible activities.  Nancie Neas, LPC

## 2015-09-25 ENCOUNTER — Other Ambulatory Visit (HOSPITAL_COMMUNITY): Payer: 59 | Admitting: Licensed Clinical Social Worker

## 2015-09-25 DIAGNOSIS — F332 Major depressive disorder, recurrent severe without psychotic features: Secondary | ICD-10-CM

## 2015-09-25 NOTE — Progress Notes (Signed)
Daily Group Progress Note  Program: IOP  Group Time: 10:45-12:00pm  Participation Level: Active  Behavioral Response: Appropriate  Type of Therapy:  Psychotherapy/ Group Therapy  Summary of Progress: Pt presented as talkative, engaged in the group process. Pt participated in a discussion about "The power of No." Pt was able to identify all daily activities required and how exhausting that can be. It was suggested to pt that feeling overwhelmed continuously can lead to burn out. The opportunity to replenish mind, body and spirit can lead to a less stressful life.

## 2015-09-25 NOTE — Progress Notes (Signed)
    Daily Group Progress Note  Program: IOP Group Time: 9:15-10:45  Participation Level: Active  Behavioral Response: Engaged, Responsive  Type: Group Therapy  Summary: Patient has a major theme of loss in her current situation. She has lost her relationship with her husband, his family, and the life that is "normal " to her. She displayed resilience in handling the circumstances of the divorce, and is actively making the needed adjustments. Therapist affirmed her activities yesterday, and shared that high anxiety is "normal " for someone in her situation.     Nancie Neas, LPC

## 2015-09-26 ENCOUNTER — Other Ambulatory Visit (HOSPITAL_COMMUNITY): Payer: 59 | Admitting: Licensed Clinical Social Worker

## 2015-09-26 DIAGNOSIS — F332 Major depressive disorder, recurrent severe without psychotic features: Secondary | ICD-10-CM | POA: Diagnosis not present

## 2015-09-26 MED ORDER — CLONAZEPAM 0.5 MG PO TABS: 0.5000 mg | ORAL_TABLET | Freq: Two times a day (BID) | ORAL | Status: DC

## 2015-09-26 NOTE — Progress Notes (Signed)
Daily Group Progress Note  Program: IOP  Group Time: 10:45-12:00pm  Participation Level: Active  Behavioral Response: Appropriate  Type of Therapy:  Psychotherapy  Summary of Progress:  Pt participated in a discussion on positive affirmations. Pt participated in an activity "I am okay" by reading one positive affirmation with explanation of thoughts/feelings surrounding the affirmation read. Pt was challenged to read the affirmation every day to overcome self-sabotaging negative thoughts.

## 2015-09-26 NOTE — Progress Notes (Signed)
Patient ID: Brittany [name], female   DOB: 1966-03-19, 50 y.o.   MRN: [id number] In consultation with Dr Adele Schilder, decided to write clonazepam 0.5 mg bid for anxiety # 30 no refills.  She has taken it before and it helped.  She is reluctant to take any medication and did not even fill her last prescription but says she will take this as she needs to break the cycle of anxiety.  She is having suicidal thoughts today but no plan or intent she says and the anxiety is a big contributer to the depression.

## 2015-09-26 NOTE — Progress Notes (Signed)
    Daily Group Progress Note  Program: IOP Group Time: 9:15-10:45 Participation Level: active Behavioral Response: responsive Type: Group Therapy Summary: Patient felt the weight of her depression/divorce today. She was very anxious, and unreceptive to the suggestions of those in group. Patient stated that she wanted to go home and go back to bed. Therapist addressed if she was able to be vulnerable today. Patient stated that she viewed vulnerability as weakness. Therapist suggested that vulnerability could be showing courage to be open and honest.  Nancie Neas, LPC

## 2015-09-27 ENCOUNTER — Other Ambulatory Visit (HOSPITAL_COMMUNITY): Payer: 59 | Admitting: Psychiatry

## 2015-09-27 DIAGNOSIS — F332 Major depressive disorder, recurrent severe without psychotic features: Secondary | ICD-10-CM

## 2015-09-27 NOTE — Progress Notes (Signed)
Patient ID: Brittany [name], female   DOB: 1966-03-21, 50 y.o.   MRN: [id number] Discharge Note  Patient:  Brittany [name] is an 50 y.o., female DOB:  1965-05-05  Date of Admission:  09/19/2015  Date of Discharge:  09/28/2015  Reason for Admission:depression  IOP Course:  Ms Birkle attended and participated.  She remains depressed and anxious but is more optimistic about herself and her future.  She is dealing with the separation well under the circumstances but that does weigh her down.  Her daughters and grandchildren are supportive and present in her life and her husband is being reasonable.  She plans to go back to work on Monday and has made accommodations that should be helpful.  Mental Status at Discharge:not suicidal though she can still have suicidal  Thoughts without intent  Lab Results: No results found for this or any previous visit (from the past 75 hour(s)).   Current outpatient prescriptions:  .  acetaminophen (TYLENOL) 500 MG tablet, Take 2 tablets (1,000 mg total) by mouth every 6 (six) hours as needed for mild pain or headache., Disp: 30 tablet, Rfl: 0 .  calcium-vitamin D (OSCAL WITH D) 500-200 MG-UNIT tablet, Take 3 tablets by mouth every morning. For bone health, Disp: , Rfl:  .  cholecalciferol (VITAMIN D) 1000 units tablet, Take 1 tablet (1,000 Units total) by mouth daily. For bone health, Disp: , Rfl:  .  clonazePAM (KLONOPIN) 0.5 MG tablet, Take 1 tablet (0.5 mg total) by mouth 2 (two) times daily., Disp: 30 tablet, Rfl: 0 .  DULoxetine (CYMBALTA) 60 MG capsule, Take 1 capsule (60 mg total) by mouth 2 (two) times daily. For depression, Disp: 60 capsule, Rfl: 0 .  loratadine (CLARITIN) 10 MG tablet, Take 1 tablet (10 mg total) by mouth daily. For allergies, Disp: , Rfl:  .  Multiple Vitamins-Minerals (MULTIVITAMIN WITH MINERALS) tablet, Take 1 tablet by mouth daily. For low vitamin, Disp: , Rfl:  .  QUEtiapine (SEROQUEL) 50 MG tablet, Take 3 tablets (150 mg total) by mouth at  bedtime. For mood control, Disp: 90 tablet, Rfl: 0 .  tobramycin-dexamethasone (TOBRADEX) ophthalmic solution, Place 1 drop into both eyes 4 (four) times daily. For eye irritation, Disp: 5 mL, Rfl: 0 .  vitamin B-12 (CYANOCOBALAMIN) 500 MCG tablet, Take 1 tablet (500 mcg total) by mouth daily. For Vitamin B-12 replacement, Disp: , Rfl:   Axis Diagnosis:  Major depression, recurrent severe without psychosis   Level of Care:  IOP  Discharge destination:  Other:  has appointment with psychiatrist and therapist  Is patient on multiple antipsychotic therapies at discharge:  No    Has Patient had three or more failed trials of antipsychotic monotherapy by history:  Negative  Patient phone:  [phone] (home)  Patient address:   [address] [place] [address],   Follow-up recommendations:  Activity:  continue current activity Diet:  continue current diet  Comments:  none  The patient received suicide prevention pamphlet:  Yes   Donnelly Angelica 09/27/2015, 11:08 AM

## 2015-09-27 NOTE — Progress Notes (Signed)
    Daily Group Progress Note  Program: IOP  Group Time: 9:00-12:00   Participation Level:  active   Behavioral Response: { engaged   Type of Therapy:  group therapy   Summary of Progress: Client stated she felt better today than yesterday.  Client was more engaged and felt more in control today.  Client discussed her anger over her divorce and towards her husband.  Therapist encouraged client to think about what the role of her anger is at the moment.  Client believes her anger is helping her take action, find value, and stand up for herself during the process of divorce.  Client stated she is anxious about an upcoming anniversary.  Therapist encouraged client to make plans for the day to cope with the stress of the day.  Nancie Neas, LPC

## 2015-09-28 ENCOUNTER — Telehealth (HOSPITAL_COMMUNITY): Payer: Self-pay | Admitting: Psychiatry

## 2015-09-28 ENCOUNTER — Other Ambulatory Visit (HOSPITAL_COMMUNITY): Payer: 59 | Admitting: Psychiatry

## 2015-09-28 NOTE — Patient Instructions (Signed)
Pt completed MH-IOP today.  F/U with Dr. Adele Schilder on 11-05-15 @ 1:15 pm and Audelia Acton, LCSW on 10-25-15 @ 9 a.m.  Encouraged support groups.  RTW on 10-01-15.

## 2015-09-28 NOTE — Progress Notes (Signed)
Brittany [name] is a 50 y.o. , married, employed, Caucasian female, who was transitioned from the inpatient unit at [facility]; treatment for worsening depressive symptoms, with SI (plan: OD). Pt currently reports continued passive SI, no plan or intent. Discussed safety options with pt at length. Pt able to contract for safety. States deterrent is her family. Pt is well known to this writer due to prior admissions in [place]. According to pt, she has been spiraling down since her mother-in-law moved into her home with she and her husband, September 2016. She has dementia and she's very aggressive with pt; whenever she's alone with her. Pt's mother-in-law residing with pt and her husband of twenty five yrs has affected her marriage so that her husband has moved out of the home. Husband moved out during [place] Day weekened 2017; and moved his mother in with his sister. Pt became so depressed to the point of coming close to taking an overdose. Luckily her sister called before she took the pills. Pt has a hx of ECT treatments. Pt reports she doesn't want anymore ECT treatments due to the loss of memory. "I have some dresses in my closet in which I don't remember shopping for." CC: prior notes for detailed hx. Pt didn't show for MH-IOP today; but did return writer's phone call.  Pt states she didn't feel like attending MH-IOP.  "I just didn't want to go out today."  According to pt, she is fine.  Plans to return to work on 10-01-15.  States that the groups were helpful.  Pt is applying coping skills learned.  A:  D/C today.  F/U with Dr. Adele Schilder on 11-05-15 @ 1:15 pm and Audelia Acton, LCSW on 10-25-15 @ 9 a.m.  Encouraged pt to call Dr. Jessina Marse [place] office if she felt she needed to see him sooner.  Pt states she thinks she will be ok, since she will be seeing [name] soon.  Highly recommended support groups.  R:  Pt receptive.         Carlis Abbott, RITA, M.Ed, CNA

## 2015-10-01 ENCOUNTER — Other Ambulatory Visit (HOSPITAL_COMMUNITY): Payer: 59

## 2015-10-02 ENCOUNTER — Encounter (HOSPITAL_COMMUNITY): Payer: Self-pay | Admitting: Psychiatry

## 2015-10-02 ENCOUNTER — Other Ambulatory Visit (HOSPITAL_COMMUNITY): Payer: 59

## 2015-10-03 ENCOUNTER — Other Ambulatory Visit (HOSPITAL_COMMUNITY): Payer: 59

## 2015-10-04 ENCOUNTER — Other Ambulatory Visit (HOSPITAL_COMMUNITY): Payer: 59

## 2015-10-05 ENCOUNTER — Other Ambulatory Visit (HOSPITAL_COMMUNITY): Payer: 59

## 2015-10-08 ENCOUNTER — Other Ambulatory Visit (HOSPITAL_COMMUNITY): Payer: 59

## 2015-10-09 ENCOUNTER — Other Ambulatory Visit (HOSPITAL_COMMUNITY): Payer: 59

## 2015-10-10 ENCOUNTER — Other Ambulatory Visit (HOSPITAL_COMMUNITY): Payer: 59

## 2015-10-24 ENCOUNTER — Other Ambulatory Visit (HOSPITAL_COMMUNITY): Payer: Self-pay

## 2015-10-24 ENCOUNTER — Other Ambulatory Visit (HOSPITAL_COMMUNITY): Payer: Self-pay | Admitting: Psychiatry

## 2015-10-24 DIAGNOSIS — F332 Major depressive disorder, recurrent severe without psychotic features: Secondary | ICD-10-CM

## 2015-10-24 MED ORDER — QUETIAPINE FUMARATE 50 MG PO TABS: 150.0000 mg | ORAL_TABLET | Freq: Every day | ORAL | Status: DC

## 2015-10-24 MED ORDER — DULOXETINE HCL 60 MG PO CPEP: 60.0000 mg | ORAL_CAPSULE | Freq: Two times a day (BID) | ORAL | Status: DC

## 2015-10-25 ENCOUNTER — Encounter (HOSPITAL_COMMUNITY): Payer: Self-pay | Admitting: Clinical

## 2015-10-25 ENCOUNTER — Ambulatory Visit (INDEPENDENT_AMBULATORY_CARE_PROVIDER_SITE_OTHER): Payer: 59 | Admitting: Clinical

## 2015-10-25 DIAGNOSIS — F4310 Post-traumatic stress disorder, unspecified: Secondary | ICD-10-CM | POA: Diagnosis not present

## 2015-10-25 DIAGNOSIS — F332 Major depressive disorder, recurrent severe without psychotic features: Secondary | ICD-10-CM | POA: Diagnosis not present
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
## 2015-11-05 ENCOUNTER — Ambulatory Visit (HOSPITAL_COMMUNITY): Payer: Self-pay | Admitting: Psychiatry

## 2015-11-15 ENCOUNTER — Ambulatory Visit (INDEPENDENT_AMBULATORY_CARE_PROVIDER_SITE_OTHER): Payer: 59 | Admitting: Clinical

## 2015-11-15 ENCOUNTER — Encounter (HOSPITAL_COMMUNITY): Payer: Self-pay | Admitting: Clinical

## 2015-11-15 DIAGNOSIS — F332 Major depressive disorder, recurrent severe without psychotic features: Secondary | ICD-10-CM | POA: Diagnosis not present

## 2015-11-15 DIAGNOSIS — F4310 Post-traumatic stress disorder, unspecified: Secondary | ICD-10-CM

## 2015-11-15 NOTE — Progress Notes (Signed)
   THERAPIST PROGRESS NOTE  Session Time: 8:05 - 9:00  Participation Level: Active  Behavioral Response: CasualAlertAnxious and Depressed  Type of Therapy: Individual Therapy  Treatment Goals addressed: improve psychiatric symptoms, Emotional Regulation Skills, Healthy Coping Skills, Improve Thoughts and Beliefs   Interventions: CBT and Motivational Interviewing, Grounding and Mindfulness Techniques  Summary: Brittany [name] is a 50 y.o female who presents with Major depressive disorder, recurrent episode, moderate and PTSD  Suicidal/Homicidal: No -without intent/plan  Therapist Response:  Sandy met with clinician for an individual session. [name] discussed her psychiatric symptoms and her current life events. [name] shared that she has been having a difficult time since last session. [name] shared that she has been challenged with transitioning from a married couple to a single woman and that she was also having work difficulties. [name] shared about being overwhelmed with emotions and having a a few angry outburst. Client and clinician discussed the events that led up to her outburst. Client and clinician discussed her negative automatic thoughts and emotions. Clinician asked open ended questions and [name] identified the evidence for and against the thoughts. She was then able to formulate healthier alternative thoughts. Client and clinician discussed how her behaviors might change if her thoughts changes. [name] shared that she would like to invest in the healthier alternative thoughts. Clinician asked open ended questions and [name] identified the positive aspects of her life. She shared about some of the healthy coping skills she has been using. Client and clinician discussed grounding and mindfulness techniques. [name] stated that she plans toput a symbol at work and home so that she can remind herself to practice her techniques.  Plan: Return again in 1 weeks.  Diagnosis: Axis I: Major depressive  disorder, recurrent episode, moderate and PTSD    Thana Ramp A, LCSW 11/15/2015

## 2015-11-25 ENCOUNTER — Other Ambulatory Visit (HOSPITAL_COMMUNITY): Payer: Self-pay | Admitting: Psychiatry

## 2015-11-25 DIAGNOSIS — F332 Major depressive disorder, recurrent severe without psychotic features: Secondary | ICD-10-CM

## 2015-11-29 ENCOUNTER — Ambulatory Visit (INDEPENDENT_AMBULATORY_CARE_PROVIDER_SITE_OTHER): Payer: 59 | Admitting: Clinical

## 2015-11-29 DIAGNOSIS — F4310 Post-traumatic stress disorder, unspecified: Secondary | ICD-10-CM

## 2015-11-29 DIAGNOSIS — F332 Major depressive disorder, recurrent severe without psychotic features: Secondary | ICD-10-CM
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
## 2015-12-04 ENCOUNTER — Encounter (HOSPITAL_COMMUNITY): Payer: Self-pay | Admitting: Clinical

## 2015-12-13 ENCOUNTER — Ambulatory Visit (HOSPITAL_COMMUNITY): Payer: Self-pay | Admitting: Clinical

## 2015-12-13 ENCOUNTER — Ambulatory Visit (HOSPITAL_COMMUNITY): Payer: Self-pay | Admitting: Psychiatry

## 2015-12-18 ENCOUNTER — Ambulatory Visit (INDEPENDENT_AMBULATORY_CARE_PROVIDER_SITE_OTHER): Payer: 59 | Admitting: Clinical

## 2015-12-18 DIAGNOSIS — F331 Major depressive disorder, recurrent, moderate: Secondary | ICD-10-CM | POA: Diagnosis not present

## 2015-12-18 DIAGNOSIS — F4310 Post-traumatic stress disorder, unspecified: Secondary | ICD-10-CM

## 2015-12-18 NOTE — Progress Notes (Signed)
.  nn

## 2015-12-18 NOTE — Progress Notes (Signed)
   THERAPIST PROGRESS NOTE  Session Time: 7:54 - 8:55  Participation Level: Active  Behavioral Response: CasualAlertAnxious and Depressed  Type of Therapy: Individual Therapy  Treatment Goals addressed: improve psychiatric symptoms, Emotional Regulation Skills, Healthy Coping Skills, Improve Thoughts and Beliefs   Interventions: CBT and Motivational Interviewing, Grounding and Mindfulness Techniques  Summary: Brittany [name] is a 50 y.o female who presents with   Major depressive disorder, recurrent episode, severe and PTSD  Suicidal/Homicidal: No -without intent/plan  Therapist Response:  Brittany [name] met with clinician for an individual session. Brittany [name] discussed her psychiatric symptoms and  her current life events. Brittany [name] shared that she has been experiencing emotional roller coaster. She shared that in addition she has experienced some panic attacks. She shared that as she moves out of the house she once shared with her husband and becomes more independent she fluctuates between excitement and sadness and fear. Clinician asked open ended questions and Brittany [name] shared her negative thoughts. She shared about reviewing and ruminating about the past. Client and clinician discussed how this affected her mood. Client and clinician discussed grounding and mindfulness techniques to interrupt rumination. Client and clinician discussed some of her ruminations. Brittany [name] had the insight that the were unhelpful not only to her mood but to solve why things happen. Client and clinician discussed healthy ways to direct her attention. Client and clinician discussed healthier alternative questions to ask her mind, such as what can she do to improve her situation, what would I like to do that I would enjoy, what can I do today that I be proud of tomorrow. Client and clinician also discussed pages panic attack. Brittany [name] shared her activities and thoughts prior to the attack, Brittany [name] shared about the sensations of the attack, Brittany [name] shared the  skills she used to get through the attacks. Client and clinician discussed her success and how she could build upon the success. Brittany [name] agreed to practice her grounding and mindfulness techniques and tell next session. Brittany [name] also agreed to do a homework packet.  Plan: Return again in 1 weeks.  Diagnosis: Axis I: Major depressive disorder, recurrent episode, severe and PTSD    Renise Gillies A, LCSW 12/18/2015

## 2015-12-20 ENCOUNTER — Encounter (HOSPITAL_COMMUNITY): Payer: Self-pay | Admitting: Clinical

## 2016-01-01 ENCOUNTER — Ambulatory Visit (INDEPENDENT_AMBULATORY_CARE_PROVIDER_SITE_OTHER): Payer: 59 | Admitting: Clinical

## 2016-01-01 ENCOUNTER — Other Ambulatory Visit (HOSPITAL_COMMUNITY): Payer: Self-pay

## 2016-01-01 DIAGNOSIS — F332 Major depressive disorder, recurrent severe without psychotic features: Secondary | ICD-10-CM

## 2016-01-01 DIAGNOSIS — F331 Major depressive disorder, recurrent, moderate: Secondary | ICD-10-CM

## 2016-01-01 DIAGNOSIS — F4310 Post-traumatic stress disorder, unspecified: Secondary | ICD-10-CM

## 2016-01-01 MED ORDER — DULOXETINE HCL 60 MG PO CPEP: 60.0000 mg | ORAL_CAPSULE | Freq: Every day | ORAL | 0 refills | Status: DC

## 2016-01-01 MED ORDER — QUETIAPINE FUMARATE 50 MG PO TABS
150.0000 mg | ORAL_TABLET | Freq: Every day | ORAL | 0 refills | Status: DC
Start: 2016-01-01 — End: 2016-01-29

## 2016-01-01 MED ORDER — HYDROXYZINE PAMOATE 25 MG PO CAPS: 25.0000 mg | ORAL_CAPSULE | Freq: Every day | ORAL | 0 refills | Status: DC

## 2016-01-01 NOTE — Progress Notes (Signed)
Patient calling for a refill on medication. Refill is appropriate and patient has a follow up next month. [facility] uses [facility] and needed a 90 day on 2 of her medications, this was sent in. The Seroquel was called into her local pharmacy because patient is out today.

## 2016-01-01 NOTE — Progress Notes (Signed)
   THERAPIST PROGRESS NOTE  Session Time:7:05 - 8:00   Participation Level: Active  Behavioral Response: CasualAlertDepressed  Type of Therapy: Individual Therapy  Treatment Goals addressed: improve psychiatric symptoms, Emotional Regulation Skills, Healthy Coping Skills, Improve Thoughts and Beliefs   Interventions: CBT and Motivational Interviewing,   Summary: Brittany [name] is a 50 y.o female who presents with Major depressive disorder, recurrent episode, severe and PTSD    Suicidal/Homicidal: No -without intent/plan  Therapist Response:  [name] met with clinician for an individual session. [name] discussed her psychiatric symptoms and her current life events. [name] shared that her depression has been better this past week she was excited to share that she didn't have any suicidal thoughts and felt a bit less hopeless. Clinician asked open ended questions and [name] shared a bout her current life transition and all the busyness of doing this transition. She shared that she realized when she was busy that her mind is less likely to dwell. Client and clinician discussed how to interrupt ruminating thoughts when she is not busy. Client and clinician discussed emotional regulation.[name] shared that she also made some changes at work. She shared that she realized that even at work  our thoughts influence our emotions and actions. She shared some steps she took in order to free her mind from ruminating a bout others or things that were in her list of responsibilities. Client and clinician discussed other areas for improvement. Client and clinician discussed the process of identifying unhelpful thoughts and beliefs. [name] agreed to continue practicing her grounding and mindfulness techniques as well as to continue challenging her negative thoughts.  Plan: Return again in 1 weeks.  Diagnosis: Axis I: Major depressive disorder, recurrent episode, severe and PTSD      Sonya Gunnoe A, LCSW 01/01/2016

## 2016-01-07 ENCOUNTER — Encounter (HOSPITAL_COMMUNITY): Payer: Self-pay | Admitting: Clinical

## 2016-01-15 ENCOUNTER — Ambulatory Visit (INDEPENDENT_AMBULATORY_CARE_PROVIDER_SITE_OTHER): Payer: 59 | Admitting: Clinical

## 2016-01-15 DIAGNOSIS — F332 Major depressive disorder, recurrent severe without psychotic features: Secondary | ICD-10-CM | POA: Diagnosis not present

## 2016-01-15 DIAGNOSIS — F4310 Post-traumatic stress disorder, unspecified: Secondary | ICD-10-CM
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
## 2016-01-21 ENCOUNTER — Encounter (HOSPITAL_COMMUNITY): Payer: Self-pay | Admitting: Clinical

## 2016-01-28 ENCOUNTER — Other Ambulatory Visit (HOSPITAL_COMMUNITY): Payer: Self-pay | Admitting: Psychiatry

## 2016-01-28 DIAGNOSIS — F332 Major depressive disorder, recurrent severe without psychotic features: Secondary | ICD-10-CM

## 2016-01-29 ENCOUNTER — Encounter (HOSPITAL_COMMUNITY): Payer: Self-pay | Admitting: Psychiatry

## 2016-01-29 ENCOUNTER — Ambulatory Visit (INDEPENDENT_AMBULATORY_CARE_PROVIDER_SITE_OTHER): Payer: 59 | Admitting: Psychiatry

## 2016-01-29 ENCOUNTER — Encounter (HOSPITAL_COMMUNITY): Payer: Self-pay

## 2016-01-29 DIAGNOSIS — F17210 Nicotine dependence, cigarettes, uncomplicated: Secondary | ICD-10-CM | POA: Diagnosis not present

## 2016-01-29 DIAGNOSIS — Z811 Family history of alcohol abuse and dependence: Secondary | ICD-10-CM

## 2016-01-29 DIAGNOSIS — F332 Major depressive disorder, recurrent severe without psychotic features: Secondary | ICD-10-CM

## 2016-01-29 DIAGNOSIS — Z818 Family history of other mental and behavioral disorders: Secondary | ICD-10-CM

## 2016-01-29 DIAGNOSIS — Z803 Family history of malignant neoplasm of breast: Secondary | ICD-10-CM

## 2016-01-29 DIAGNOSIS — Z79899 Other long term (current) drug therapy: Secondary | ICD-10-CM | POA: Diagnosis not present

## 2016-01-29 DIAGNOSIS — Z800 Family history of malignant neoplasm of digestive organs: Secondary | ICD-10-CM

## 2016-01-29 DIAGNOSIS — Z791 Long term (current) use of non-steroidal anti-inflammatories (NSAID): Secondary | ICD-10-CM | POA: Diagnosis not present

## 2016-01-29 MED ORDER — QUETIAPINE FUMARATE 50 MG PO TABS: 150.0000 mg | ORAL_TABLET | Freq: Every day | ORAL | 0 refills | Status: DC

## 2016-01-29 NOTE — Progress Notes (Signed)
BH MD/PA/NP OP Progress Note  01/29/2016 2:10 PM [place] MEKHIA CASAL  MRN:  [id number]  Chief Complaint:  routine medication checkSubjective:  Feeling much better after ECT HPI:   Ms Bingaman says ECT was very helpful.  She is happier and finds pleasure in things again.  Things with her estranged husband are going okay.  Her company was sold and she was retained and given a significant raise.  Depression is much reduced but anxiety remains periodically and the hydroxyzine helps. Visit Diagnosis:    ICD-9-CM ICD-10-CM   1. Severe recurrent major depression without psychotic features ([place]) 296.33 F33.2 QUEtiapine (SEROQUEL) 50 MG tablet    Past Psychiatric History: no changes  Past Medical History:  Past Medical History:  Diagnosis Date  . Anxiety   . Arthritis    hips, knees and hands   . Depression   . GERD (gastroesophageal reflux disease)    hx of   . H/O hiatal hernia   . Hypercholesterolemia   . Hypertension   . Migraine   . Morbid obesity ([place])   . PTSD (post-traumatic stress disorder)   . Sleep apnea    no CPAP machine     Past Surgical History:  Procedure Laterality Date  . ABDOMINAL HYSTERECTOMY  1997 & 2006  . BREATH TEK H PYLORI  05/03/2012   Procedure: BREATH TEK H PYLORI;  Surgeon: [name], MD;  Location: [name];  Service: General;  Laterality: N/A;  . COLONOSCOPY     hx of benign polyps   . FOOT SURGERY    . GASTRIC BYPASS    . GASTRIC ROUX-EN-Y N/A 07/05/2012   Procedure: LAPAROSCOPIC ROUX-EN-Y GASTRIC;  Surgeon: [name], MD;  Location: [facility];  Service: General;  Laterality: N/A;  . IRRIGATION AND DEBRIDEMENT ABSCESS N/A 01/01/2014   Procedure: IRRIGATION AND DEBRIDEMENT ABSCESS;  Surgeon: [name], MD;  Location: [facility];  Service: General;  Laterality: N/A;  . LAPAROSCOPIC LYSIS OF ADHESIONS N/A 07/05/2012   Procedure: LAPAROSCOPIC LYSIS OF ADHESIONS;  Surgeon: [name], MD;  Location: [facility];  Service: General;  Laterality: N/A;   repair of abdominal wall hernia  . right tube and ovary removed     . UPPER GI ENDOSCOPY N/A 07/05/2012   Procedure: UPPER GI ENDOSCOPY;  Surgeon: [name], MD;  Location: [facility];  Service: General;  Laterality: N/A;    Family Psychiatric History: no changes  Family History:  Family History  Problem Relation Age of Onset  . Cancer Mother     colon  . Cancer Maternal Aunt     breast  . Depression Father   . Depression Sister   . Post-traumatic stress disorder Sister   . Alcohol abuse Paternal Uncle   . Bipolar disorder Cousin   . Anxiety disorder Sister     Social History:  Social History   Social History  . Marital status: Married    Spouse name: N/A  . Number of children: N/A  . Years of education: N/A   Social History Main Topics  . Smoking status: Smoker, Current Status Unknown    Packs/day: 0.25    Types: Cigarettes    Last attempt to quit: 04/27/2004  . Smokeless tobacco: Never Used  . Alcohol use No     Comment: Occasional use  . Drug use: No  . Sexual activity: Not Currently   Other Topics Concern  . None   Social History Narrative  . None  Allergies:  Allergies  Allergen Reactions  . Amoxicillin Hives    .Marland KitchenHas patient had a PCN reaction causing immediate rash, facial/tongue/throat swelling, SOB or lightheadedness with hypotension: Yes Has patient had a PCN reaction causing severe rash involving mucus membranes or skin necrosis: No Has patient had a PCN reaction that required hospitalization No Has patient had a PCN reaction occurring within the last 10 years: No If all of the above answers are "NO", then may proceed with Cephalosporin use.   . Bactrim [Sulfamethoxazole-Trimethoprim] Hives and Rash  . Lamictal [Lamotrigine] Hives and Rash    Metabolic Disorder Labs: Lab Results  Component Value Date   HGBA1C 5.2 11/03/2014   MPG 108 10/12/2014   MPG 111 06/13/2014   No results found for: PROLACTIN Lab Results  Component Value Date    CHOL 236 (H) 11/03/2014   TRIG 76 11/03/2014   HDL 66 11/03/2014   CHOLHDL 3.6 11/03/2014   VLDL 15 11/03/2014   LDLCALC 155 (H) 11/03/2014   LDLCALC 153 (H) 10/12/2014     Current Medications: Current Outpatient Prescriptions  Medication Sig Dispense Refill  . acetaminophen (TYLENOL) 500 MG tablet Take 2 tablets (1,000 mg total) by mouth every 6 (six) hours as needed for mild pain or headache. 30 tablet 0  . calcium-vitamin D (OSCAL WITH D) 500-200 MG-UNIT tablet Take 3 tablets by mouth every morning. For bone health    . cholecalciferol (VITAMIN D) 1000 units tablet Take 1 tablet (1,000 Units total) by mouth daily. For bone health    . clonazePAM (KLONOPIN) 0.5 MG tablet Take 1 tablet (0.5 mg total) by mouth 2 (two) times daily. 30 tablet 0  . DULoxetine (CYMBALTA) 60 MG capsule Take 1 capsule (60 mg total) by mouth daily. 90 capsule 0  . hydrOXYzine (VISTARIL) 25 MG capsule Take 1 capsule (25 mg total) by mouth daily. 90 capsule 0  . loratadine (CLARITIN) 10 MG tablet Take 1 tablet (10 mg total) by mouth daily. For allergies    . Multiple Vitamins-Minerals (MULTIVITAMIN WITH MINERALS) tablet Take 1 tablet by mouth daily. For low vitamin    . QUEtiapine (SEROQUEL) 50 MG tablet Take 3 tablets (150 mg total) by mouth at bedtime. 90 tablet 0  . tobramycin-dexamethasone (TOBRADEX) ophthalmic solution Place 1 drop into both eyes 4 (four) times daily. For eye irritation (Patient not taking: Reported on 10/25/2015) 5 mL 0  . vitamin B-12 (CYANOCOBALAMIN) 500 MCG tablet Take 1 tablet (500 mcg total) by mouth daily. For Vitamin B-12 replacement     No current facility-administered medications for this visit.     Neurologic: Headache: Negative Seizure: Negative Paresthesias: Negative  Musculoskeletal: Strength & Muscle Tone: within normal limits Gait & Station: normal Patient leans: N/A  Psychiatric Specialty Exam: ROS  Blood pressure 126/72, pulse 73, height 5\' 10"  (1.778 m), weight  237 lb 6.4 oz (107.7 kg).Body mass index is 34.06 kg/m.  General Appearance: Well Groomed  Eye Contact:  Good  Speech:  Clear and Coherent  Volume:  Normal  Mood:  Euthymic  Affect:  Congruent  Thought Process:  Coherent  Orientation:  Full (Time, Place, and Person)  Thought Content: Logical   Suicidal Thoughts:  No  Homicidal Thoughts:  No  Memory:  Immediate;   Good Recent;   Good Remote;   Good  Judgement:  Good  Insight:  Good  Psychomotor Activity:  Normal  Concentration:  Concentration: Good and Attention Span: Good  Recall:  Good  Fund of Knowledge:  Good  Language: Good  Akathisia:  Negative  Handed:  Right  AIMS (if indicated):  0  Assets:  Communication Skills Desire for Improvement Financial Resources/Insurance Housing Leisure Time Physical Health Resilience Social Support Talents/Skills Transportation Vocational/Educational  ADL's:  Intact  Cognition: WNL  Sleep:  okay     Treatment Plan Summary:depression:  Better after ECT.  Continue Cymbalta 60 mg bid Anxiety:  Helped by th hydroxyzine and therapy PTSD:  Helped by therapy Plan is to return for med follow up in 3 months and to continue therapy  Donnelly Angelica, MD 01/29/2016, 2:10 PM

## 2016-02-07 ENCOUNTER — Ambulatory Visit (INDEPENDENT_AMBULATORY_CARE_PROVIDER_SITE_OTHER): Payer: 59 | Admitting: Clinical

## 2016-02-07 DIAGNOSIS — F332 Major depressive disorder, recurrent severe without psychotic features: Secondary | ICD-10-CM | POA: Diagnosis not present

## 2016-02-07 DIAGNOSIS — F4310 Post-traumatic stress disorder, unspecified: Secondary | ICD-10-CM

## 2016-02-07 NOTE — Progress Notes (Signed)
   THERAPIST PROGRESS NOTE  Session Time: 7:02 - 8:00  Participation Level: Active  Behavioral Response: Neat and Well GroomedAlertDepressed  Type of Therapy: Individual Therapy  Treatment Goals addressed: improve psychiatric symptoms, Emotional Regulation Skills, Healthy Coping Skills, Improve Thoughts and Beliefs   Interventions: CBT and Motivational Interviewing, Grounding and Mindfulness Techniques  Summary: Brittany [name] is a 50 y.o female who presents with Major depressive disorder, recurrent episode, severe and PTSD    Suicidal/Homicidal: No -without intent/plan  Therapist Response:  Shaquana met with clinician for an individual session. [name] discussed her psychiatric symptoms and her current life events.  [name] shared that she has been having ups and downs emotionally. But that the last couple weeks have been better and that she has had no suicidal ideation and no homicidal ideation. Clinician asked open ended questions and [name] shared that she is adjusting to life in her new home. She shared that she has had some emotional difficulty  when she saw her ex-husband. Client and clinician discussed natural emotions. [name] shared a bout judgment against her self her feeling emotions. Client and clinician discussed her negative thoughts a bout feeling and expressing her emotions. Clinician asked open ended questions and [name] identified the evidence for and against her negative thoughts a bout feeling emotions. [name] and clinician discussed alternative healthier ways to view emotions. [name] and clinician discussed healthy expressions of emotions versus unhealthy expressions. [name] shared that she was also experiencing a lot of anxiety related to returning to certain social situations. Clinician asked open ended questions and [name] identified her beliefs a bout others judgments a bout her. Clinician asked her if she would have those judgments if it was somebody else. [name] had the insight that it was her  judgment against herself. Client and clinician discussed these judgments against herself, evidence for and against those judgments, and [name] was able to identify healthier alternative thoughts. Client and clinician agreed to discuss emotions and her process at future sessions.  Plan: Return again in 1-2  weeks.  Diagnosis: Axis I: Major depressive disorder, recurrent episode, severe and PTSD     Israa Caban A, LCSW 02/07/2016

## 2016-02-12 ENCOUNTER — Encounter (HOSPITAL_COMMUNITY): Payer: Self-pay | Admitting: Clinical

## 2016-02-20 ENCOUNTER — Other Ambulatory Visit (HOSPITAL_COMMUNITY): Payer: Self-pay | Admitting: Psychiatry

## 2016-02-20 DIAGNOSIS — F332 Major depressive disorder, recurrent severe without psychotic features: Secondary | ICD-10-CM

## 2016-02-21 ENCOUNTER — Ambulatory Visit (INDEPENDENT_AMBULATORY_CARE_PROVIDER_SITE_OTHER): Payer: 59 | Admitting: Clinical

## 2016-02-21 DIAGNOSIS — F4310 Post-traumatic stress disorder, unspecified: Secondary | ICD-10-CM

## 2016-02-21 DIAGNOSIS — F332 Major depressive disorder, recurrent severe without psychotic features: Secondary | ICD-10-CM

## 2016-02-21 NOTE — Progress Notes (Signed)
   THERAPIST PROGRESS NOTE  Session Time: 7:05 -8:00  Participation Level: Active  Behavioral Response: CasualAlertDepressed  Type of Therapy: Individual Therapy  Treatment Goals addressed: improve psychiatric symptoms, Emotional Regulation Skills, Healthy Coping Skills, Improve Thoughts and Beliefs   Interventions: CBT and Motivational Interviewing,   Summary: Brittany [name] is [name] 50 y.o female who presents with Major depressive disorder, recurrent episode, severe and PTSD    Suicidal/Homicidal: No -without intent/plan  Therapist Response:  Brittany [name] met with clinician for an individual session. Brittany [name] discussed her psychiatric symptoms and  her current life events. Brittany [name] shared that she has had both good days and bad days with her symptoms since last session. She shared that she has not had any suicidal or homicidal ideation which makes her believe the medications and therapy is working. Clinician asked open ended questions about what was working and the skills she has been using (to reinforce her success and the use of the skills) Clinician asked open ended questions about the challenges she has been experiencing. Brittany [name] shared about some ruminations that keep resurfacing. Brittany [name] and clinician discussed using her grounding and mindfulness techniques. Brittany [name] and clinician discussed her ruminations. Clinician asked open ended question and Brittany [name] identified her emotions when she ruminated. She identified the key negative thoughts and beliefs. Clinician asked open ended questions and Brittany [name] identified the evidence for and against the thoughts and beliefs. Brittany [name] was then able to formulate healthier alternative thoughts. Brittany [name] and clinician discussed how her experience would change if she believed the healthier alternative thoughts. Brittany [name] shared life might still be challenging but she would be more accepting and thus happier. Brittany [name] agreed to continue practicing her grounding and mindfulness techniques as well as work  to challenge her negative thoughts and beliefs.  Plan: Return again in 1 weeks.  Diagnosis: Axis I: Major depressive disorder, recurrent episode, severe and PTSD     Brittany Mercier A, LCSW 02/21/2016

## 2016-02-28 ENCOUNTER — Encounter (HOSPITAL_COMMUNITY): Payer: Self-pay | Admitting: Clinical

## 2016-03-05 ENCOUNTER — Ambulatory Visit (INDEPENDENT_AMBULATORY_CARE_PROVIDER_SITE_OTHER): Payer: 59 | Admitting: Clinical

## 2016-03-05 DIAGNOSIS — F332 Major depressive disorder, recurrent severe without psychotic features: Secondary | ICD-10-CM | POA: Diagnosis not present

## 2016-03-05 DIAGNOSIS — F4310 Post-traumatic stress disorder, unspecified: Secondary | ICD-10-CM

## 2016-03-05 NOTE — Progress Notes (Signed)
   THERAPIST PROGRESS NOTE  Session Time: 7:00 -7:55  Participation Level: Active  Behavioral Response: CasualAlertDepressed  Type of Therapy: Individual Therapy  Treatment Goals addressed: improve psychiatric symptoms, Emotional Regulation Skills, Healthy Coping Skills, Improve Thoughts and Beliefs   Interventions: CBT and Motivational Interviewing,   Summary: Brittany [name] is a 50 y.o female who presents with Major depressive disorder, recurrent episode, severe and PTSD    Suicidal/Homicidal: No -without intent/plan  Therapist Response:  Brinda met with clinician for an individual session. Jozee discussed her psychiatric symptoms and her current life events. Emmarose shared that she feels like she is making progress. She stated that she has not had any suicidal ideation. She shared that she has had passing thoughts of not wanting to be here but that she is able to recognize them and change her focus. Tashonda shared that she has been depressed thinking about the holidays. Her and her husband broke up this year and she is missing the traditions she has had. She stated "I guess this is my new norm" Clinician asked open ended questions and Nedra questioned that thought which makes her feel depressed. After considering the evidence for and against the thought. She had the insight that this year is just a transition that in the future she can choose new traditions and experiences. She stated this thought felt much better to her. Client and clinician reviewed how to interrupt negative thoughts and how to challenge and change them.  Plan: Return again in 1 weeks.  Diagnosis: Axis I: Major depressive disorder, recurrent episode, severe and PTSD     Toure Edmonds A, LCSW 03/05/2016

## 2016-03-13 ENCOUNTER — Ambulatory Visit (HOSPITAL_COMMUNITY): Payer: Self-pay | Admitting: Clinical

## 2016-03-18 ENCOUNTER — Ambulatory Visit (INDEPENDENT_AMBULATORY_CARE_PROVIDER_SITE_OTHER): Payer: 59 | Admitting: Family

## 2016-03-18 ENCOUNTER — Encounter: Payer: Self-pay | Admitting: Family

## 2016-03-18 ENCOUNTER — Other Ambulatory Visit (INDEPENDENT_AMBULATORY_CARE_PROVIDER_SITE_OTHER): Payer: 59

## 2016-03-18 VITALS — BP 124/82 | HR 68 | Temp 97.7°F | Resp 16 | Ht 70.0 in | Wt 243.0 lb

## 2016-03-18 DIAGNOSIS — Z1231 Encounter for screening mammogram for malignant neoplasm of breast: Secondary | ICD-10-CM | POA: Diagnosis not present

## 2016-03-18 DIAGNOSIS — Z0000 Encounter for general adult medical examination without abnormal findings: Secondary | ICD-10-CM | POA: Diagnosis not present

## 2016-03-18 DIAGNOSIS — E6609 Other obesity due to excess calories: Secondary | ICD-10-CM

## 2016-03-18 DIAGNOSIS — E669 Obesity, unspecified: Secondary | ICD-10-CM | POA: Insufficient documentation

## 2016-03-18 DIAGNOSIS — Z6834 Body mass index (BMI) 34.0-34.9, adult: Secondary | ICD-10-CM | POA: Diagnosis not present

## 2016-03-18 DIAGNOSIS — E66811 Obesity, class 1: Secondary | ICD-10-CM

## 2016-03-18 LAB — CBC
HEMATOCRIT: 36.2 % (ref 36.0–46.0)
HEMOGLOBIN: 12.2 g/dL (ref 12.0–15.0)
MCHC: 33.7 g/dL (ref 30.0–36.0)
MCV: 95.2 fl (ref 78.0–100.0)
PLATELETS: 320 10*3/uL (ref 150.0–400.0)
RBC: 3.8 Mil/uL — ABNORMAL LOW (ref 3.87–5.11)
RDW: 13.3 % (ref 11.5–15.5)
WBC: 4.6 10*3/uL (ref 4.0–10.5)

## 2016-03-18 LAB — LIPID PANEL
CHOLESTEROL: 228 mg/dL — AB (ref 0–200)
HDL: 71.4 mg/dL (ref >39.00)
LDL Cholesterol: 140 mg/dL — ABNORMAL HIGH (ref 0–99)
NonHDL: 156.85
TRIGLYCERIDES: 82 mg/dL (ref 0.0–149.0)
Total CHOL/HDL Ratio: 3
VLDL: 16.4 mg/dL (ref 0.0–40.0)

## 2016-03-18 LAB — COMPREHENSIVE METABOLIC PANEL
ALK PHOS: 78 U/L (ref 39–117)
ALT: 12 U/L (ref 0–35)
AST: 10 U/L (ref 0–37)
Albumin: 4 g/dL (ref 3.5–5.2)
BILIRUBIN TOTAL: 0.6 mg/dL (ref 0.2–1.2)
BUN: 14 mg/dL (ref 6–23)
CALCIUM: 9.2 mg/dL (ref 8.4–10.5)
CO2: 29 mEq/L (ref 19–32)
Chloride: 105 mEq/L (ref 96–112)
Creatinine, Ser: 0.72 mg/dL (ref 0.40–1.20)
GFR: 90.7 mL/min (ref >60.00)
Glucose, Bld: 88 mg/dL (ref 70–99)
POTASSIUM: 4.6 meq/L (ref 3.5–5.1)
Sodium: 142 mEq/L (ref 135–145)
TOTAL PROTEIN: 7 g/dL (ref 6.0–8.3)

## 2016-03-18 LAB — VITAMIN D 25 HYDROXY (VIT D DEFICIENCY, FRACTURES): VITD: 34.46 ng/mL (ref 30.00–100.00)

## 2016-03-18 MED ORDER — CLOTRIMAZOLE-BETAMETHASONE 1-0.05 % EX CREA: 1.0000 "application " | TOPICAL_CREAM | Freq: Two times a day (BID) | CUTANEOUS | 0 refills | Status: DC

## 2016-03-18 NOTE — Assessment & Plan Note (Signed)
1) Anticipatory Guidance: Discussed importance of wearing a seatbelt while driving and not texting while driving; changing batteries in smoke detector at least once annually; wearing suntan lotion when outside; eating a balanced and moderate diet; getting physical activity at least 30 minutes per day.  2) Immunizations / Screenings / Labs:  All immunizations are up-toper recommendations. Due for a breast cancer screening with mammogram order placed. Due for colon cancer screening with colonoscopy referral to GI placed. Obtain vitamin D for vitamin D deficiency screening. All other screenings are up-to-date per recommendations. Obtain CBC, TSH, CMET, and lipid profile.    Overall well exam with risk factors for cardiovascular disease including hypertension, hyperlipidemia, and obesity. Recommend weight loss of 5-10% of current body weight through nutrition and physical activity. Emphasize importance of decreasing saturated fats and processed/sugary foods in dietary intake. Hypertension appears adequately controlled and not on medication. Lipid profile will be updated today. She is currently in the middle of being separated and working on a divorce. Her behavioral health needs are being seen by psychiatry. Continue healthy lifestyle behaviors and choices. Follow-up prevention exam in 1 year. Follow-up office visit pending blood work

## 2016-03-18 NOTE — Progress Notes (Signed)
Subjective:    Patient ID: Brittany [name], female    DOB: June 29, 1965, 50 y.o.   MRN: [id number]  Chief Complaint  Patient presents with  . Establish Care    CPE, Fasting     HPI:  Brittany [name] is a 50 y.o. female who presents today for an annual wellness visit.   1) Health Maintenance -   Diet - Averages about 3 meals per day consisting of a regular diet; Caffeine intake of 2-3 cups per day.   Exercise - Walking on occasion; previously about 3 miles per day   2) Preventative Exams / Immunizations:  Dental -- Up to date  Vision -- Up to date   Health Maintenance  Topic Date Due  . HIV Screening  01/11/1980  . MAMMOGRAM  01/11/2015  . COLONOSCOPY  01/11/2015  . TETANUS/TDAP  12/27/2020  . INFLUENZA VACCINE  Completed    Immunization History  Administered Date(s) Administered  . Influenza,inj,Quad PF,36+ Mos 01/09/2014  . Tdap 12/28/2010     Allergies  Allergen Reactions  . Amoxicillin Hives    .Marland KitchenHas patient had a PCN reaction causing immediate rash, facial/tongue/throat swelling, SOB or lightheadedness with hypotension: Yes Has patient had a PCN reaction causing severe rash involving mucus membranes or skin necrosis: No Has patient had a PCN reaction that required hospitalization No Has patient had a PCN reaction occurring within the last 10 years: No If all of the above answers are "NO", then may proceed with Cephalosporin use.   . Bactrim [Sulfamethoxazole-Trimethoprim] Hives and Rash  . Lamictal [Lamotrigine] Hives and Rash     Outpatient Medications Prior to Visit  Medication Sig Dispense Refill  . calcium-vitamin D (OSCAL WITH D) 500-200 MG-UNIT tablet Take 3 tablets by mouth every morning. For bone health    . cholecalciferol (VITAMIN D) 1000 units tablet Take 1 tablet (1,000 Units total) by mouth daily. For bone health    . DULoxetine (CYMBALTA) 60 MG capsule Take 1 capsule (60 mg total) by mouth daily. 90 capsule 0  . hydrOXYzine (VISTARIL) 25 MG  capsule Take 1 capsule (25 mg total) by mouth daily. 90 capsule 0  . loratadine (CLARITIN) 10 MG tablet Take 1 tablet (10 mg total) by mouth daily. For allergies    . Multiple Vitamins-Minerals (MULTIVITAMIN WITH MINERALS) tablet Take 1 tablet by mouth daily. For low vitamin    . QUEtiapine (SEROQUEL) 50 MG tablet Take 3 tablets (150 mg total) by mouth at bedtime. 90 tablet 0  . vitamin B-12 (CYANOCOBALAMIN) 500 MCG tablet Take 1 tablet (500 mcg total) by mouth daily. For Vitamin B-12 replacement    . acetaminophen (TYLENOL) 500 MG tablet Take 2 tablets (1,000 mg total) by mouth every 6 (six) hours as needed for mild pain or headache. 30 tablet 0  . clonazePAM (KLONOPIN) 0.5 MG tablet Take 1 tablet (0.5 mg total) by mouth 2 (two) times daily. 30 tablet 0  . tobramycin-dexamethasone (TOBRADEX) ophthalmic solution Place 1 drop into both eyes 4 (four) times daily. For eye irritation 5 mL 0   No facility-administered medications prior to visit.      Past Medical History:  Diagnosis Date  . Allergy   . Anxiety   . Arthritis    hips, knees and hands   . Depression   . GERD (gastroesophageal reflux disease)    hx of   . H/O hiatal hernia   . History of chicken pox   . Hypercholesterolemia   . Hypertension   .  Migraine   . Migraines   . Morbid obesity ([place])   . Positive TB test   . PTSD (post-traumatic stress disorder)   . Sleep apnea    no CPAP machine      Past Surgical History:  Procedure Laterality Date  . ABDOMINAL HYSTERECTOMY  1997 & 2006  . BREATH TEK H PYLORI  05/03/2012   Procedure: BREATH TEK H PYLORI;  Surgeon: [name], MD;  Location: [name];  Service: General;  Laterality: N/A;  . COLONOSCOPY     hx of benign polyps   . FOOT SURGERY    . GASTRIC BYPASS    . GASTRIC ROUX-EN-Y N/A 07/05/2012   Procedure: LAPAROSCOPIC ROUX-EN-Y GASTRIC;  Surgeon: [name], MD;  Location: [facility];  Service: General;  Laterality: N/A;  . IRRIGATION AND DEBRIDEMENT  ABSCESS N/A 01/01/2014   Procedure: IRRIGATION AND DEBRIDEMENT ABSCESS;  Surgeon: [name], MD;  Location: [facility];  Service: General;  Laterality: N/A;  . LAPAROSCOPIC LYSIS OF ADHESIONS N/A 07/05/2012   Procedure: LAPAROSCOPIC LYSIS OF ADHESIONS;  Surgeon: [name], MD;  Location: [facility];  Service: General;  Laterality: N/A;  repair of abdominal wall hernia  . right tube and ovary removed     . UPPER GI ENDOSCOPY N/A 07/05/2012   Procedure: UPPER GI ENDOSCOPY;  Surgeon: [name], MD;  Location: [facility];  Service: General;  Laterality: N/A;     Family History  Problem Relation Age of Onset  . Colon cancer Mother   . Depression Father   . Diabetes Father   . Heart disease Father   . Bladder Cancer Father   . Depression Sister   . Post-traumatic stress disorder Sister   . Alcohol abuse Paternal Uncle   . Bipolar disorder Cousin   . Anxiety disorder Sister   . Cancer Maternal Aunt     breast  . Melanoma Maternal Grandmother   . Healthy Maternal Grandfather   . Healthy Paternal Grandmother   . Heart disease Paternal Grandfather   . Stroke Paternal Grandfather   . Diabetes Paternal Grandfather      Social History   Social History  . Marital status: Legally Separated    Spouse name: N/A  . Number of children: 0  . Years of education: 15   Occupational History  . Software engineer     Social History Main Topics  . Smoking status: Former Smoker    Packs/day: 0.10    Years: 1.00    Types: Cigarettes    Quit date: 04/27/2004  . Smokeless tobacco: Never Used  . Alcohol use No     Comment: Occasional use  . Drug use: No  . Sexual activity: Not Currently    Birth control/ protection: Surgical   Other Topics Concern  . Not on file   Social History Narrative   Fun: Color and walk, house renovations   Denies abuse and feels safe at home.       Review of Systems  Constitutional: Denies fever, chills, fatigue, or significant weight  gain/loss. HENT: Head: Denies headache or neck pain Ears: Denies changes in hearing, ringing in ears, earache, drainage Nose: Denies discharge, stuffiness, itching, nosebleed, sinus pain Throat: Denies sore throat, hoarseness, dry mouth, sores, thrush Eyes: Denies loss/changes in vision, pain, redness, blurry/double vision, flashing lights Cardiovascular: Denies chest pain/discomfort, tightness, palpitations, shortness of breath with activity, difficulty lying down, swelling, sudden awakening with shortness of breath Respiratory: Denies shortness of  breath, cough, sputum production, wheezing Gastrointestinal: Denies dysphasia, heartburn, change in appetite, nausea, change in bowel habits, rectal bleeding, constipation, diarrhea, yellow skin or eyes Genitourinary: Denies frequency, urgency, burning/pain, blood in urine, incontinence, change in urinary strength. Musculoskeletal: Denies muscle/joint pain, stiffness, back pain, redness or swelling of joints, trauma Skin: Denies rashes, lumps, itching, dryness, color changes, or hair/nail changes Neurological: Denies dizziness, fainting, seizures, weakness, numbness, tingling, tremor Psychiatric - Denies nervousness, stress, depression or memory loss Endocrine: Denies heat or cold intolerance, sweating, frequent urination, excessive thirst, changes in appetite Hematologic: Denies ease of bruising or bleeding     Objective:     BP 124/82 (BP Location: Left Arm, Patient Position: Sitting, Cuff Size: Large)   Pulse 68   Temp 97.7 F (36.5 C) (Oral)   Resp 16   Ht 5\' 10"  (1.778 m)   Wt 243 lb (110.2 kg)   SpO2 96%   BMI 34.87 kg/m  Nursing note and vital signs reviewed.  Physical Exam  Constitutional: She is oriented to person, place, and time. She appears well-developed and well-nourished.  HENT:  Head: Normocephalic.  Right Ear: Hearing, tympanic membrane, external ear and ear canal normal.  Left Ear: Hearing, tympanic membrane,  external ear and ear canal normal.  Nose: Nose normal.  Mouth/Throat: Uvula is midline, oropharynx is clear and moist and mucous membranes are normal.  Eyes: Conjunctivae and EOM are normal. Pupils are equal, round, and reactive to light.  Neck: Neck supple. No JVD present. No tracheal deviation present. No thyromegaly present.  Cardiovascular: Normal rate, regular rhythm, normal heart sounds and intact distal pulses.   Pulmonary/Chest: Effort normal and breath sounds normal.  Abdominal: Soft. Bowel sounds are normal. She exhibits no distension and no mass. There is no tenderness. There is no rebound and no guarding.  Musculoskeletal: Normal range of motion. She exhibits no edema or tenderness.  Lymphadenopathy:    She has no cervical adenopathy.  Neurological: She is alert and oriented to person, place, and time. She has normal reflexes. No cranial nerve deficit. She exhibits normal muscle tone. Coordination normal.  Skin: Skin is warm and dry.  Psychiatric: She has a normal mood and affect. Her behavior is normal. Judgment and thought content normal.       Assessment & Plan:   Problem List Items Addressed This Visit      Other   Routine general medical examination at a health care facility - Primary    1) Anticipatory Guidance: Discussed importance of wearing a seatbelt while driving and not texting while driving; changing batteries in smoke detector at least once annually; wearing suntan lotion when outside; eating a balanced and moderate diet; getting physical activity at least 30 minutes per day.  2) Immunizations / Screenings / Labs:  All immunizations are up-toper recommendations. Due for a breast cancer screening with mammogram order placed. Due for colon cancer screening with colonoscopy referral to GI placed. Obtain vitamin D for vitamin D deficiency screening. All other screenings are up-to-date per recommendations. Obtain CBC, TSH, CMET, and lipid profile.    Overall well  exam with risk factors for cardiovascular disease including hypertension, hyperlipidemia, and obesity. Recommend weight loss of 5-10% of current body weight through nutrition and physical activity. Emphasize importance of decreasing saturated fats and processed/sugary foods in dietary intake. Hypertension appears adequately controlled and not on medication. Lipid profile will be updated today. She is currently in the middle of being separated and working on a divorce. Her behavioral health  needs are being seen by psychiatry. Continue healthy lifestyle behaviors and choices. Follow-up prevention exam in 1 year. Follow-up office visit pending blood work      Relevant Orders   Ambulatory referral to Gynecology   Ambulatory referral to Gastroenterology   CBC   Comprehensive metabolic panel   Lipid panel   VITAMIN D 25 Hydroxy (Vit-D Deficiency, Fractures)   TSH   Obesity    BMI of 34. Recommend weight loss of 5-10% of current body weight. Recommend increasing physical activity to 30 minutes of moderate level activity daily. Encourage nutritional intake that focuses on nutrient dense foods and is moderate, varied, and balanced and is low in saturated fats and processed/sugary foods. Continue to monitor.         Other Visit Diagnoses    Encounter for screening mammogram for breast cancer       Relevant Orders   MM DIGITAL SCREENING BILATERAL       I have discontinued Ms. Michalec's acetaminophen, tobramycin-dexamethasone, and clonazePAM. I am also having her start on clotrimazole-betamethasone. Additionally, I am having her maintain her calcium-vitamin D, cholecalciferol, loratadine, multivitamin with minerals, vitamin B-12, DULoxetine, hydrOXYzine, and QUEtiapine.   Follow-up: Return if symptoms worsen or fail to improve.   Mauricio Po, FNP

## 2016-03-18 NOTE — Patient Instructions (Signed)
Thank you for choosing [facility].  SUMMARY AND INSTRUCTIONS:  Medication:  Please continue to take your medications as prescribed.  Your prescription(s) have been submitted to your pharmacy or been printed and provided for you. Please take as directed and contact our office if you believe you are having problem(s) with the medication(s) or have any questions.  Labs:  Please stop by the lab on the lower level of the building for your blood work. Your results will be released to [facility] (or called to you) after review, usually within 72 hours after test completion. If any changes need to be made, you will be notified at that same time.  1.) The lab is open from 7:30am to 5:30 pm Monday-Friday 2.) No appointment is necessary 3.) Fasting (if needed) is 6-8 hours after food and drink; black coffee and water are okay   Referrals:  You will receive a call to schedule your gynecology and colonoscopy appointment and mammogram.  Referrals have been made during this visit. You should expect to hear back from our schedulers in about 7-10 days in regards to establishing an appointment with the specialists we discussed.   Follow up:  If your symptoms worsen or fail to improve, please contact our office for further instruction, or in case of emergency go directly to the emergency room at the closest medical facility.   Health Maintenance, Female Introduction Adopting a healthy lifestyle and getting preventive care can go a long way to promote health and wellness. Talk with your health care provider about what schedule of regular examinations is right for you. This is a good chance for you to check in with your provider about disease prevention and staying healthy. In between checkups, there are plenty of things you can do on your own. Experts have done a lot of research about which lifestyle changes and preventive measures are most likely to keep you healthy. Ask your health care provider  for more information. Weight and diet Eat a healthy diet  Be sure to include plenty of vegetables, fruits, low-fat dairy products, and lean protein.  Do not eat a lot of foods high in solid fats, added sugars, or salt.  Get regular exercise. This is one of the most important things you can do for your health.  Most adults should exercise for at least 150 minutes each week. The exercise should increase your heart rate and make you sweat (moderate-intensity exercise).  Most adults should also do strengthening exercises at least twice a week. This is in addition to the moderate-intensity exercise. Maintain a healthy weight  Body mass index (BMI) is a measurement that can be used to identify possible weight problems. It estimates body fat based on height and weight. Your health care provider can help determine your BMI and help you achieve or maintain a healthy weight.  For females 79 years of age and older:  A BMI below 18.5 is considered underweight.  A BMI of 18.5 to 24.9 is normal.  A BMI of 25 to 29.9 is considered overweight.  A BMI of 30 and above is considered obese. Watch levels of cholesterol and blood lipids  You should start having your blood tested for lipids and cholesterol at 51 years of age, then have this test every 5 years.  You may need to have your cholesterol levels checked more often if:  Your lipid or cholesterol levels are high.  You are older than 51 years of age.  You are at high risk for  heart disease. Cancer screening Lung Cancer  Lung cancer screening is recommended for adults 40-5 years old who are at high risk for lung cancer because of a history of smoking.  A yearly low-dose CT scan of the lungs is recommended for people who:  Currently smoke.  Have quit within the past 15 years.  Have at least a 30-pack-year history of smoking. A pack year is smoking an average of one pack of cigarettes a day for 1 year.  Yearly screening should  continue until it has been 15 years since you quit.  Yearly screening should stop if you develop a health problem that would prevent you from having lung cancer treatment. Breast Cancer  Practice breast self-awareness. This means understanding how your breasts normally appear and feel.  It also means doing regular breast self-exams. Let your health care provider know about any changes, no matter how small.  If you are in your 20s or 30s, you should have a clinical breast exam (CBE) by a health care provider every 1-3 years as part of a regular health exam.  If you are 32 or older, have a CBE every year. Also consider having a breast X-ray (mammogram) every year.  If you have a family history of breast cancer, talk to your health care provider about genetic screening.  If you are at high risk for breast cancer, talk to your health care provider about having an MRI and a mammogram every year.  Breast cancer gene (BRCA) assessment is recommended for women who have family members with BRCA-related cancers. BRCA-related cancers include:  Breast.  Ovarian.  Tubal.  Peritoneal cancers.  Results of the assessment will determine the need for genetic counseling and BRCA1 and BRCA2 testing. Cervical Cancer  Your health care provider may recommend that you be screened regularly for cancer of the pelvic organs (ovaries, uterus, and vagina). This screening involves a pelvic examination, including checking for microscopic changes to the surface of your cervix (Pap test). You may be encouraged to have this screening done every 3 years, beginning at age 86.  For women ages 106-65, health care providers may recommend pelvic exams and Pap testing every 3 years, or they may recommend the Pap and pelvic exam, combined with testing for human papilloma virus (HPV), every 5 years. Some types of HPV increase your risk of cervical cancer. Testing for HPV may also be done on women of any age with unclear Pap test  results.  Other health care providers may not recommend any screening for nonpregnant women who are considered low risk for pelvic cancer and who do not have symptoms. Ask your health care provider if a screening pelvic exam is right for you.  If you have had past treatment for cervical cancer or a condition that could lead to cancer, you need Pap tests and screening for cancer for at least 20 years after your treatment. If Pap tests have been discontinued, your risk factors (such as having a new sexual partner) need to be reassessed to determine if screening should resume. Some women have medical problems that increase the chance of getting cervical cancer. In these cases, your health care provider may recommend more frequent screening and Pap tests. Colorectal Cancer  This type of cancer can be detected and often prevented.  Routine colorectal cancer screening usually begins at 51 years of age and continues through 51 years of age.  Your health care provider may recommend screening at an earlier age if you have risk factors  for colon cancer.  Your health care provider may also recommend using home test kits to check for hidden blood in the stool.  A small camera at the end of a tube can be used to examine your colon directly (sigmoidoscopy or colonoscopy). This is done to check for the earliest forms of colorectal cancer.  Routine screening usually begins at age 62.  Direct examination of the colon should be repeated every 5-10 years through 51 years of age. However, you may need to be screened more often if early forms of precancerous polyps or small growths are found. Skin Cancer  Check your skin from head to toe regularly.  Tell your health care provider about any new moles or changes in moles, especially if there is a change in a mole's shape or color.  Also tell your health care provider if you have a mole that is larger than the size of a pencil eraser.  Always use sunscreen.  Apply sunscreen liberally and repeatedly throughout the day.  Protect yourself by wearing long sleeves, pants, a wide-brimmed hat, and sunglasses whenever you are outside. Heart disease, diabetes, and high blood pressure  High blood pressure causes heart disease and increases the risk of stroke. High blood pressure is more likely to develop in:  People who have blood pressure in the high end of the normal range (130-139/85-89 mm Hg).  People who are overweight or obese.  People who are African American.  If you are 69-9 years of age, have your blood pressure checked every 3-5 years. If you are 30 years of age or older, have your blood pressure checked every year. You should have your blood pressure measured twice-once when you are at a hospital or clinic, and once when you are not at a hospital or clinic. Record the average of the two measurements. To check your blood pressure when you are not at a hospital or clinic, you can use:  An automated blood pressure machine at a pharmacy.  A home blood pressure monitor.  If you are between 47 years and 46 years old, ask your health care provider if you should take aspirin to prevent strokes.  Have regular diabetes screenings. This involves taking a blood sample to check your fasting blood sugar level.  If you are at a normal weight and have a low risk for diabetes, have this test once every three years after 51 years of age.  If you are overweight and have a high risk for diabetes, consider being tested at a younger age or more often. Preventing infection Hepatitis B  If you have a higher risk for hepatitis B, you should be screened for this virus. You are considered at high risk for hepatitis B if:  You were born in a country where hepatitis B is common. Ask your health care provider which countries are considered high risk.  Your parents were born in a high-risk country, and you have not been immunized against hepatitis B (hepatitis B  vaccine).  You have HIV or AIDS.  You use needles to inject street drugs.  You live with someone who has hepatitis B.  You have had sex with someone who has hepatitis B.  You get hemodialysis treatment.  You take certain medicines for conditions, including cancer, organ transplantation, and autoimmune conditions. Hepatitis C  Blood testing is recommended for:  Everyone born from 40 through 1966.  Anyone with known risk factors for hepatitis C. Sexually transmitted infections (STIs)  You should be screened  for sexually transmitted infections (STIs) including gonorrhea and chlamydia if:  You are sexually active and are younger than 51 years of age.  You are older than 51 years of age and your health care provider tells you that you are at risk for this type of infection.  Your sexual activity has changed since you were last screened and you are at an increased risk for chlamydia or gonorrhea. Ask your health care provider if you are at risk.  If you do not have HIV, but are at risk, it may be recommended that you take a prescription medicine daily to prevent HIV infection. This is called pre-exposure prophylaxis (PrEP). You are considered at risk if:  You are sexually active and do not regularly use condoms or know the HIV status of your partner(s).  You take drugs by injection.  You are sexually active with a partner who has HIV. Talk with your health care provider about whether you are at high risk of being infected with HIV. If you choose to begin PrEP, you should first be tested for HIV. You should then be tested every 3 months for as long as you are taking PrEP. Pregnancy  If you are premenopausal and you may become pregnant, ask your health care provider about preconception counseling.  If you may become pregnant, take 400 to 800 micrograms (mcg) of folic acid every day.  If you want to prevent pregnancy, talk to your health care provider about birth control  (contraception). Osteoporosis and menopause  Osteoporosis is a disease in which the bones lose minerals and strength with aging. This can result in serious bone fractures. Your risk for osteoporosis can be identified using a bone density scan.  If you are 52 years of age or older, or if you are at risk for osteoporosis and fractures, ask your health care provider if you should be screened.  Ask your health care provider whether you should take a calcium or vitamin D supplement to lower your risk for osteoporosis.  Menopause may have certain physical symptoms and risks.  Hormone replacement therapy may reduce some of these symptoms and risks. Talk to your health care provider about whether hormone replacement therapy is right for you. Follow these instructions at home:  Schedule regular health, dental, and eye exams.  Stay current with your immunizations.  Do not use any tobacco products including cigarettes, chewing tobacco, or electronic cigarettes.  If you are pregnant, do not drink alcohol.  If you are breastfeeding, limit how much and how often you drink alcohol.  Limit alcohol intake to no more than 1 drink per day for nonpregnant women. One drink equals 12 ounces of beer, 5 ounces of wine, or 1 ounces of hard liquor.  Do not use street drugs.  Do not share needles.  Ask your health care provider for help if you need support or information about quitting drugs.  Tell your health care provider if you often feel depressed.  Tell your health care provider if you have ever been abused or do not feel safe at home. This information is not intended to replace advice given to you by your health care provider. Make sure you discuss any questions you have with your health care provider. Document Released: 10/14/2010 Document Revised: 09/06/2015 Document Reviewed: 01/02/2015  2017 Elsevier

## 2016-03-18 NOTE — Assessment & Plan Note (Signed)
BMI of 34. Recommend weight loss of 5-10% of current body weight. Recommend increasing physical activity to 30 minutes of moderate level activity daily. Encourage nutritional intake that focuses on nutrient dense foods and is moderate, varied, and balanced and is low in saturated fats and processed/sugary foods. Continue to monitor.  

## 2016-03-20 ENCOUNTER — Ambulatory Visit (INDEPENDENT_AMBULATORY_CARE_PROVIDER_SITE_OTHER): Payer: 59 | Admitting: Clinical

## 2016-03-20 DIAGNOSIS — F332 Major depressive disorder, recurrent severe without psychotic features: Secondary | ICD-10-CM

## 2016-03-20 DIAGNOSIS — F4310 Post-traumatic stress disorder, unspecified: Secondary | ICD-10-CM

## 2016-03-24 ENCOUNTER — Encounter (HOSPITAL_COMMUNITY): Payer: Self-pay | Admitting: Clinical

## 2016-03-24 NOTE — Progress Notes (Signed)
   THERAPIST PROGRESS NOTE  Session Time: 7:03 - 8:00   Participation Level: Active  Behavioral Response: CasualAlertDepressed  Type of Therapy: Individual Therapy  Treatment Goals addressed: improve psychiatric symptoms, Emotional Regulation Skills, Healthy Coping Skills, Improve Thoughts and Beliefs   Interventions: CBT and Motivational Interviewing,   Summary: Seng ROSELENE GRAY is a 50 y.o female who presents with Major depressive disorder, recurrent episode, severe and PTSD    Suicidal/Homicidal: No -without intent/plan  Therapist Response:  Matilde met with clinician for an individual session. Pegah discussed her psychiatric symptoms and her current life events. Jaydah shared that she had some good days, but more difficult days. She shared that the holiday was challenging (1st Thanksgiving after her divorce and anniversary of their first date). Clinician asked open ended questions and Kinslee shared about the things that were especially challenging for her. Tiawana shared her negative automatic thoughts about the events. Clinician asked open ended questions and Mckynlee identified the evidence for and against the thoughts. Client and clinician discussed alternative healthier thoughts. Client and clinician discussed how she felt when she thought the healthier alternative thoughts. Icis shared that she felt more hopeful and less stressed. Client and clinician discussed the process and her growth in identifying her negative thoughts and formulating healthier thoughts.  Plan: Return again in 1 weeks.  Diagnosis: Axis I: Major depressive disorder, recurrent episode, severe and PTSD     Daniyla Pfahler A, LCSW 03/24/2016

## 2016-03-27 ENCOUNTER — Telehealth: Payer: Self-pay | Admitting: Gastroenterology

## 2016-04-01 ENCOUNTER — Other Ambulatory Visit (HOSPITAL_COMMUNITY): Payer: Self-pay | Admitting: Psychiatry

## 2016-04-01 ENCOUNTER — Encounter: Payer: Self-pay | Admitting: Gastroenterology

## 2016-04-01 DIAGNOSIS — F332 Major depressive disorder, recurrent severe without psychotic features: Secondary | ICD-10-CM

## 2016-04-02 NOTE — Telephone Encounter (Signed)
Called patient to verify she is now taking Cymbalta 60 mg twice a day as was documented by Dr. Lovena Le on 01/29/16.  Patient to return to see Dr. Adele Schilder on 04/30/16 and met with Dr. Lovena Le who authorized a new 90 day order for Cymbalta 90 mg, one twice a day, #180 with no refills.  Patient informed new order would be sent in for her to her pharmacy.  New order e-scribed as approved and authorized by Dr. Lovena Le with correct dosage this date to patient's [facility].

## 2016-04-03 ENCOUNTER — Ambulatory Visit (INDEPENDENT_AMBULATORY_CARE_PROVIDER_SITE_OTHER): Payer: 59 | Admitting: Clinical

## 2016-04-03 ENCOUNTER — Encounter (HOSPITAL_COMMUNITY): Payer: Self-pay | Admitting: Clinical

## 2016-04-03 DIAGNOSIS — F4310 Post-traumatic stress disorder, unspecified: Secondary | ICD-10-CM | POA: Diagnosis not present

## 2016-04-03 DIAGNOSIS — F332 Major depressive disorder, recurrent severe without psychotic features: Secondary | ICD-10-CM | POA: Diagnosis not present

## 2016-04-03 NOTE — Progress Notes (Signed)
   THERAPIST PROGRESS NOTE  Session Time: 7:05 - 8:00  Participation Level: Active  Behavioral Response: CasualAlertDepressed  Type of Therapy: Individual Therapy  Treatment Goals addressed: improve psychiatric symptoms, Emotional Regulation Skills, Healthy Coping Skills, Improve Thoughts and Beliefs   Interventions: CBT and Motivational Interviewing,   Summary: Brittany [name] is [name] 50 y.o female who presents with Major depressive disorder, recurrent episode, severe and PTSD    Suicidal/Homicidal: No -without intent/plan  Therapist Response:  Brittany [name] met with clinician for an individual session. Brittany [name] discussed her psychiatric symptoms and  her current life events. Brittany [name] shared that she has been struggling with her depression and emotions lately. She shared about an event that triggered feelings of anger and sadness. Clinician asked open ended questions, Brittany [name] identified the triggering event, her emotions, her negative automatic thoughts, the evidence for and against the thoughts. She was then able to formulate healthier alternative thoughts. Brittany [name] shared that prior she had felt poorly about her self for having emotions. Brittany [name] and clinician discussed natural verses manufactured emotions. Clinician asked open ended questions and Brittany [name] shared that she had passive suicidal thoughts when she condemned herself for having emotions. Brittany [name] and clinician discussed her emotions (which were healthy), her healthy thoughts and the condemnation (unhealthy thoughts). Brittany [name] and clinician discussed the challenge of experiencing natural emotions after having bottled them up for several years. Brittany [name] shared some of her concerns about the upcoming holidays. Clinician asked open ended questions and Brittany [name] identified her healthy and her unhealthy thoughts. Brittany [name] and clinician briefly reviewed how to interrupt and challenge unhealthy thoughts.    Plan: Return again in 1 weeks.  Diagnosis: Axis I: Major depressive disorder,  recurrent episode, severe and PTSD     Brittany Stainback A, LCSW 04/03/2016

## 2016-04-20 ENCOUNTER — Other Ambulatory Visit (HOSPITAL_COMMUNITY): Payer: Self-pay | Admitting: Psychiatry

## 2016-04-20 DIAGNOSIS — F332 Major depressive disorder, recurrent severe without psychotic features: Secondary | ICD-10-CM

## 2016-04-21 ENCOUNTER — Other Ambulatory Visit (HOSPITAL_COMMUNITY): Payer: Self-pay

## 2016-04-21 DIAGNOSIS — F332 Major depressive disorder, recurrent severe without psychotic features: Secondary | ICD-10-CM

## 2016-04-21 MED ORDER — QUETIAPINE FUMARATE 50 MG PO TABS: 150.0000 mg | ORAL_TABLET | Freq: Every day | ORAL | 0 refills | Status: DC

## 2016-04-22 ENCOUNTER — Encounter: Payer: Self-pay | Admitting: Gynecology

## 2016-04-22 ENCOUNTER — Ambulatory Visit (INDEPENDENT_AMBULATORY_CARE_PROVIDER_SITE_OTHER): Payer: Managed Care, Other (non HMO) | Admitting: Gynecology

## 2016-04-22 VITALS — BP 122/78 | Ht 69.0 in | Wt 269.0 lb

## 2016-04-22 DIAGNOSIS — Z01411 Encounter for gynecological examination (general) (routine) with abnormal findings: Secondary | ICD-10-CM | POA: Diagnosis not present

## 2016-04-22 DIAGNOSIS — N9089 Other specified noninflammatory disorders of vulva and perineum: Secondary | ICD-10-CM

## 2016-04-22 DIAGNOSIS — B9689 Other specified bacterial agents as the cause of diseases classified elsewhere: Secondary | ICD-10-CM

## 2016-04-22 DIAGNOSIS — N760 Acute vaginitis: Secondary | ICD-10-CM | POA: Diagnosis not present

## 2016-04-22 DIAGNOSIS — Z780 Asymptomatic menopausal state: Secondary | ICD-10-CM

## 2016-04-22 LAB — WET PREP FOR TRICH, YEAST, CLUE
TRICH WET PREP: NONE SEEN
YEAST WET PREP: NONE SEEN

## 2016-04-22 MED ORDER — TINIDAZOLE 500 MG PO TABS: ORAL_TABLET | ORAL | 0 refills | Status: DC

## 2016-04-22 MED ORDER — FLUCONAZOLE 150 MG PO TABS: 150.0000 mg | ORAL_TABLET | Freq: Once | ORAL | 0 refills | Status: AC

## 2016-04-22 NOTE — Progress Notes (Addendum)
Brittany [name] 30-Jul-1965 [id number]   History:    51 y.o.  for annual gyn exam who is a new patient to the practice. Her PCP has been doing her blood work and her vaccines. Patient also been followed by behavioral health clinic as a result of her PTSD, depression and anxiety. She reports that she had ECT treatment in 2016 2017. Her PCP recently gave her vaginal cream for suspected yeast infection. Patient also many years ago had bowel resection as a result of abdominal abscess. Also prior to that back in 2006 she had a supracervical hysterectomy with bilateral salpingo-oophorectomy. She stated she was on hormonal replacement therapy for proximally 6 years and has been off for several years now. She is currently in the process of 1 through a divorce. She has not been sexually active in over a year. She had a colonoscopy in 2014 benign polyps had been removed. Both her mother and maternal aunt had colon cancer. Patient has not had a bone density study as of yet. Patient denied any prior history of any abnormal Pap smears. Patient had been complaining some vulvar irritation and slight odor.  Past medical history,surgical history, family history and social history were all reviewed and documented in the EPIC chart.  Gynecologic History No LMP recorded. Patient has had a hysterectomy. Contraception: status post hysterectomy Last Pap: 2013. Results were: normal Last mammogram: 2016. Results were: normal  Obstetric History OB History  Gravida Para Term Preterm AB Living  0 0 0 0 0 0  SAB TAB Ectopic Multiple Live Births  0 0 0 0 0         ROS: A ROS was performed and pertinent positives and negatives are included in the history.  GENERAL: No fevers or chills. HEENT: No change in vision, no earache, sore throat or sinus congestion. NECK: No pain or stiffness. CARDIOVASCULAR: No chest pain or pressure. No palpitations. PULMONARY: No shortness of breath, cough or wheeze. GASTROINTESTINAL: No  abdominal pain, nausea, vomiting or diarrhea, melena or bright red blood per rectum. GENITOURINARY: No urinary frequency, urgency, hesitancy or dysuria. MUSCULOSKELETAL: No joint or muscle pain, no back pain, no recent trauma. DERMATOLOGIC: No rash, no itching, no lesions. ENDOCRINE: No polyuria, polydipsia, no heat or cold intolerance. No recent change in weight. HEMATOLOGICAL: No anemia or easy bruising or bleeding. NEUROLOGIC: No headache, seizures, numbness, tingling or weakness. PSYCHIATRIC: No depression, no loss of interest in normal activity or change in sleep pattern.     Exam: chaperone present  BP 122/78   Ht 5\' 9"  (1.753 m)   Wt 269 lb (122 kg)   BMI 39.72 kg/m   Body mass index is 39.72 kg/m.  General appearance : Well developed well nourished female. No acute distress HEENT: Eyes: no retinal hemorrhage or exudates,  Neck supple, trachea midline, no carotid bruits, no thyroidmegaly Lungs: Clear to auscultation, no rhonchi or wheezes, or rib retractions  Heart: Regular rate and rhythm, no murmurs or gallops Breast:Examined in sitting and supine position were symmetrical in appearance, no palpable masses or tenderness,  no skin retraction, no nipple inversion, no nipple discharge, no skin discoloration, no axillary or supraclavicular lymphadenopathy Abdomen: no palpable masses or tenderness, no rebound or guarding Extremities: no edema or skin discoloration or tenderness  Pelvic:  Bartholin, Urethra, Skene Glands: Within normal limits             Vagina: No gross lesions or discharge  Cervix: No gross lesions or discharge  Uterus absent  Adnexa  Without masses or tenderness  Anus and perineum  normal   Rectovaginal  normal sphincter tone without palpated masses or tenderness             Hemoccult colonoscopy this year   Wet prep few clue cells, few white blood cells and many bacteria  Assessment/Plan:  51 y.o. female for annual exam with clinical evidence of bacterial  vaginosis. Since she only used the vaginal antifungal cream that her PCP gave her for 2 days and going to have her discontinue that and take a Diflucan 150 mg today and tomorrow and the following day she is going to take Tindamax 500 mg 4 tablets daily. Patient has a mammogram scheduled for tomorrow. She'll return back to the office the next 2 weeks for bone density study. Pap smear with HPV screening was done today. Patient states that she has several family members have history of thyroid disease and her PCP recently has not done so so we'll check a TSH today.   Terrance Mass MD, 4:28 PM 04/22/2016

## 2016-04-22 NOTE — Addendum Note (Signed)
Addended by: Joaquin Music on: 04/22/2016 05:03 PM   Modules accepted: Orders

## 2016-04-22 NOTE — Patient Instructions (Addendum)
Vaginal Yeast infection, Adult Vaginal yeast infection is a condition that causes soreness, swelling, and redness (inflammation) of the vagina. It also causes vaginal discharge. This is a common condition. Some women get this infection frequently. What are the causes? This condition is caused by a change in the normal balance of the yeast (candida) and bacteria that live in the vagina. This change causes an overgrowth of yeast, which causes the inflammation. What increases the risk? This condition is more likely to develop in:  Women who take antibiotic medicines.  Women who have diabetes.  Women who take birth control pills.  Women who are pregnant.  Women who douche often.  Women who have a weak defense (immune) system.  Women who have been taking steroid medicines for a long time.  Women who frequently wear tight clothing. What are the signs or symptoms? Symptoms of this condition include:  White, thick vaginal discharge.  Swelling, itching, redness, and irritation of the vagina. The lips of the vagina (vulva) may be affected as well.  Pain or a burning feeling while urinating.  Pain during sex. How is this diagnosed? This condition is diagnosed with a medical history and physical exam. This will include a pelvic exam. Your health care provider will examine a sample of your vaginal discharge under a microscope. Your health care provider may send this sample for testing to confirm the diagnosis. How is this treated? This condition is treated with medicine. Medicines may be over-the-counter or prescription. You may be told to use one or more of the following:  Medicine that is taken orally.  Medicine that is applied as a cream.  Medicine that is inserted directly into the vagina (suppository). Follow these instructions at home:  Take or apply over-the-counter and prescription medicines only as told by your health care provider.  Do not have sex until your health care  provider has approved. Tell your sex partner that you have a yeast infection. That person should go to his or her health care provider if he or she develops symptoms.  Do not wear tight clothes, such as pantyhose or tight pants.  Avoid using tampons until your health care provider approves.  Eat more yogurt. This may help to keep your yeast infection from returning.  Try taking a sitz bath to help with discomfort. This is a warm water bath that is taken while you are sitting down. The water should only come up to your hips and should cover your buttocks. Do this 3-4 times per day or as told by your health care provider.  Do not douche.  Wear breathable, cotton underwear.  If you have diabetes, keep your blood sugar levels under control. Contact a health care provider if:  You have a fever.  Your symptoms go away and then return.  Your symptoms do not get better with treatment.  Your symptoms get worse.  You have new symptoms.  You develop blisters in or around your vagina.  You have blood coming from your vagina and it is not your menstrual period.  You develop pain in your abdomen. This information is not intended to replace advice given to you by your health care provider. Make sure you discuss any questions you have with your health care provider. Document Released: 01/08/2005 Document Revised: 09/12/2015 Document Reviewed: 10/02/2014 Elsevier Interactive Patient Education  2017 Elsevier Inc.   Bacterial Vaginosis Bacterial vaginosis is a vaginal infection that occurs when the normal balance of bacteria in the vagina is disrupted. It   results from an overgrowth of certain bacteria. This is the most common vaginal infection among women ages 15-44. Because bacterial vaginosis increases your risk for STIs (sexually transmitted infections), getting treated can help reduce your risk for chlamydia, gonorrhea, herpes, and HIV (human immunodeficiency virus). Treatment is also  important for preventing complications in pregnant women, because this condition can cause an early (premature) delivery. What are the causes? This condition is caused by an increase in harmful bacteria that are normally present in small amounts in the vagina. However, the reason that the condition develops is not fully understood. What increases the risk? The following factors may make you more likely to develop this condition: Having a new sexual partner or multiple sexual partners. Having unprotected sex. Douching. Having an intrauterine device (IUD). Smoking. Drug and alcohol abuse. Taking certain antibiotic medicines. Being pregnant. You cannot get bacterial vaginosis from toilet seats, bedding, swimming pools, or contact with objects around you. What are the signs or symptoms? Symptoms of this condition include: Grey or white vaginal discharge. The discharge can also be watery or foamy. A fish-like odor with discharge, especially after sexual intercourse or during menstruation. Itching in and around the vagina. Burning or pain with urination. Some women with bacterial vaginosis have no signs or symptoms. How is this diagnosed? This condition is diagnosed based on: Your medical history. A physical exam of the vagina. Testing a sample of vaginal fluid under a microscope to look for a large amount of bad bacteria or abnormal cells. Your health care provider may use a cotton swab or a small wooden spatula to collect the sample. How is this treated? This condition is treated with antibiotics. These may be given as a pill, a vaginal cream, or a medicine that is put into the vagina (suppository). If the condition comes back after treatment, a second round of antibiotics may be needed. Follow these instructions at home: Medicines  Take over-the-counter and prescription medicines only as told by your health care provider. Take or use your antibiotic as told by your health care provider.  Do not stop taking or using the antibiotic even if you start to feel better. General instructions  If you have a female sexual partner, tell her that you have a vaginal infection. She should see her health care provider and be treated if she has symptoms. If you have a female sexual partner, he does not need treatment. During treatment: Avoid sexual activity until you finish treatment. Do not douche. Avoid alcohol as directed by your health care provider. Avoid breastfeeding as directed by your health care provider. Drink enough water and fluids to keep your urine clear or pale yellow. Keep the area around your vagina and rectum clean. Wash the area daily with warm water. Wipe yourself from front to back after using the toilet. Keep all follow-up visits as told by your health care provider. This is important. How is this prevented? Do not douche. Wash the outside of your vagina with warm water only. Use protection when having sex. This includes latex condoms and dental dams. Limit how many sexual partners you have. To help prevent bacterial vaginosis, it is best to have sex with just one partner (monogamous). Make sure you and your sexual partner are tested for STIs. Wear cotton or cotton-lined underwear. Avoid wearing tight pants and pantyhose, especially during summer. Limit the amount of alcohol that you drink. Do not use any products that contain nicotine or tobacco, such as cigarettes and e-cigarettes. If you need   help quitting, ask your health care provider. Do not use illegal drugs. Where to find more information: Centers for Disease Control and Prevention: www.cdc.gov/std American Sexual Health Association (ASHA): www.ashastd.org U.S. Department of Health and Human Services, Office on Women's Health: www.womenshealth.gov/ or https://www.womenshealth.gov/a-z-topics/bacterial-vaginosis Contact a health care provider if: Your symptoms do not improve, even after treatment. You have  more discharge or pain when urinating. You have a fever. You have pain in your abdomen. You have pain during sex. You have vaginal bleeding between periods. Summary Bacterial vaginosis is a vaginal infection that occurs when the normal balance of bacteria in the vagina is disrupted. Because bacterial vaginosis increases your risk for STIs (sexually transmitted infections), getting treated can help reduce your risk for chlamydia, gonorrhea, herpes, and HIV (human immunodeficiency virus). Treatment is also important for preventing complications in pregnant women, because the condition can cause an early (premature) delivery. This condition is treated with antibiotic medicines. These may be given as a pill, a vaginal cream, or a medicine that is put into the vagina (suppository). This information is not intended to replace advice given to you by your health care provider. Make sure you discuss any questions you have with your health care provider. Document Released: 03/31/2005 Document Revised: 12/15/2015 Document Reviewed: 12/15/2015 Elsevier Interactive Patient Education  2017 Elsevier Inc.  

## 2016-04-23 ENCOUNTER — Ambulatory Visit
Admission: RE | Admit: 2016-04-23 | Discharge: 2016-04-23 | Disposition: A | Discharge status: Home / Self Care | Payer: Managed Care, Other (non HMO) | Source: Ambulatory Visit | Attending: Family | Admitting: Family

## 2016-04-23 DIAGNOSIS — Z1231 Encounter for screening mammogram for malignant neoplasm of breast: Secondary | ICD-10-CM

## 2016-04-23 LAB — PAP IG W/ RFLX HPV ASCU

## 2016-04-23 LAB — TSH: TSH: 2.46 m[IU]/L

## 2016-04-23 NOTE — Addendum Note (Signed)
Addended by: Terrance Mass on: 04/23/2016 08:00 AM   Modules accepted: Orders

## 2016-04-28 ENCOUNTER — Ambulatory Visit (HOSPITAL_COMMUNITY): Payer: Self-pay | Admitting: Clinical

## 2016-04-28 NOTE — Telephone Encounter (Signed)
Pt is schedule for colon on 06-05-16

## 2016-04-30 ENCOUNTER — Ambulatory Visit (HOSPITAL_COMMUNITY): Payer: Self-pay | Admitting: Psychiatry

## 2016-05-05 ENCOUNTER — Ambulatory Visit (INDEPENDENT_AMBULATORY_CARE_PROVIDER_SITE_OTHER): Payer: Self-pay | Admitting: Clinical

## 2016-05-05 DIAGNOSIS — F4310 Post-traumatic stress disorder, unspecified: Secondary | ICD-10-CM

## 2016-05-05 DIAGNOSIS — F332 Major depressive disorder, recurrent severe without psychotic features: Secondary | ICD-10-CM

## 2016-05-05 NOTE — Progress Notes (Signed)
   THERAPIST PROGRESS NOTE  Session Time: 7:07 -8:02  Participation Level: Active  Behavioral Response: CasualAlertDepressed  Type of Therapy: Individual Therapy  Treatment Goals addressed: improve psychiatric symptoms, Emotional Regulation Skills, Healthy Coping Skills, Improve Thoughts and Beliefs   Interventions: CBT and Motivational Interviewing,   Summary: Brittany [name] is a 51 y.o female who presents with Major depressive disorder, recurrent episode, severe and PTSD    Suicidal/Homicidal: No -without intent/plan  Therapist Response:  Brittany [name] met with clinician for an individual session. Brittany [name] discussed her psychiatric symptoms and  her current life events. Brittany [name] shared that she has been struggling recently emotionally. She shared that she has not experienced any suicidal ideation since last session. She shared that she has been struggling because her father has been really ill and she has been driving back and forth to [place] to be with her family. Clinician asked open ended questions and [name] identified her natural and manufactured emotions. Client and clinician discussed the differences. Brittany [name] shared she continues to shy away from social situations. Clinician asked open ended questions and [name] had the insight that she might be limiting her  Support by doing this. . Clinician asked open ended questions and Brittany [name] identified her negative thoughts and emotions about engaging with others. She shared about her physical reactions too. Clinician asked open ended questions and Brittany [name] identified the evidence for and against the thoughts. Client and clinician discussed possible actions if her negative thoughts were true. Client and clinician discussed how she might ease into being more engaged    Plan: Return again in 1 weeks.  Diagnosis: Axis I: Major depressive disorder, recurrent episode, severe and PTSD     Righteous Claiborne A, LCSW 05/05/2016

## 2016-05-06 ENCOUNTER — Ambulatory Visit (INDEPENDENT_AMBULATORY_CARE_PROVIDER_SITE_OTHER): Payer: Self-pay | Admitting: Psychiatry

## 2016-05-06 ENCOUNTER — Encounter (HOSPITAL_COMMUNITY): Payer: Self-pay | Admitting: Clinical

## 2016-05-06 ENCOUNTER — Encounter (HOSPITAL_COMMUNITY): Payer: Self-pay | Admitting: Psychiatry

## 2016-05-06 DIAGNOSIS — Z888 Allergy status to other drugs, medicaments and biological substances status: Secondary | ICD-10-CM

## 2016-05-06 DIAGNOSIS — Z811 Family history of alcohol abuse and dependence: Secondary | ICD-10-CM

## 2016-05-06 DIAGNOSIS — Z833 Family history of diabetes mellitus: Secondary | ICD-10-CM

## 2016-05-06 DIAGNOSIS — Z803 Family history of malignant neoplasm of breast: Secondary | ICD-10-CM

## 2016-05-06 DIAGNOSIS — Z800 Family history of malignant neoplasm of digestive organs: Secondary | ICD-10-CM

## 2016-05-06 DIAGNOSIS — F332 Major depressive disorder, recurrent severe without psychotic features: Secondary | ICD-10-CM

## 2016-05-06 DIAGNOSIS — Z90710 Acquired absence of both cervix and uterus: Secondary | ICD-10-CM

## 2016-05-06 DIAGNOSIS — Z8052 Family history of malignant neoplasm of bladder: Secondary | ICD-10-CM

## 2016-05-06 DIAGNOSIS — Z818 Family history of other mental and behavioral disorders: Secondary | ICD-10-CM

## 2016-05-06 DIAGNOSIS — Z87891 Personal history of nicotine dependence: Secondary | ICD-10-CM

## 2016-05-06 DIAGNOSIS — Z9889 Other specified postprocedural states: Secondary | ICD-10-CM

## 2016-05-06 DIAGNOSIS — Z823 Family history of stroke: Secondary | ICD-10-CM

## 2016-05-06 DIAGNOSIS — Z8249 Family history of ischemic heart disease and other diseases of the circulatory system: Secondary | ICD-10-CM

## 2016-05-06 DIAGNOSIS — Z79899 Other long term (current) drug therapy: Secondary | ICD-10-CM

## 2016-05-06 MED ORDER — QUETIAPINE FUMARATE 50 MG PO TABS: 150.0000 mg | ORAL_TABLET | Freq: Every day | ORAL | 2 refills | Status: DC

## 2016-05-06 MED ORDER — DULOXETINE HCL 60 MG PO CPEP: 60.0000 mg | ORAL_CAPSULE | Freq: Two times a day (BID) | ORAL | 0 refills | Status: DC
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
## 2016-05-22 ENCOUNTER — Ambulatory Visit (AMBULATORY_SURGERY_CENTER): Payer: Self-pay | Admitting: *Deleted

## 2016-05-22 VITALS — Ht 70.0 in | Wt 253.2 lb

## 2016-05-22 DIAGNOSIS — Z800 Family history of malignant neoplasm of digestive organs: Secondary | ICD-10-CM

## 2016-05-22 MED ORDER — NA SULFATE-K SULFATE-MG SULF 17.5-3.13-1.6 GM/177ML PO SOLN: ORAL | 0 refills | Status: DC

## 2016-05-22 NOTE — Progress Notes (Signed)
No allergies to eggs or soy. No problems with anesthesia.  Pt given Emmi instructions for colonoscopy  No oxygen use  No diet drug use  

## 2016-05-25 ENCOUNTER — Other Ambulatory Visit (HOSPITAL_COMMUNITY): Payer: Self-pay | Admitting: Psychiatry

## 2016-05-25 DIAGNOSIS — F332 Major depressive disorder, recurrent severe without psychotic features: Secondary | ICD-10-CM

## 2016-05-26 ENCOUNTER — Ambulatory Visit (INDEPENDENT_AMBULATORY_CARE_PROVIDER_SITE_OTHER): Payer: Managed Care, Other (non HMO) | Admitting: Clinical

## 2016-05-26 DIAGNOSIS — F332 Major depressive disorder, recurrent severe without psychotic features: Secondary | ICD-10-CM

## 2016-05-26 DIAGNOSIS — F4310 Post-traumatic stress disorder, unspecified: Secondary | ICD-10-CM | POA: Diagnosis not present
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
## 2016-05-27 ENCOUNTER — Encounter (HOSPITAL_COMMUNITY): Payer: Self-pay | Admitting: Clinical

## 2016-06-05 ENCOUNTER — Encounter: Payer: Self-pay | Admitting: Gastroenterology

## 2016-06-05 ENCOUNTER — Ambulatory Visit (AMBULATORY_SURGERY_CENTER): Payer: Managed Care, Other (non HMO) | Admitting: Gastroenterology

## 2016-06-05 VITALS — BP 120/84 | HR 64 | Temp 99.1°F | Resp 11 | Ht 70.0 in | Wt 253.0 lb

## 2016-06-05 DIAGNOSIS — Z1212 Encounter for screening for malignant neoplasm of rectum: Secondary | ICD-10-CM | POA: Diagnosis not present

## 2016-06-05 DIAGNOSIS — Z1211 Encounter for screening for malignant neoplasm of colon: Secondary | ICD-10-CM

## 2016-06-05 DIAGNOSIS — Z800 Family history of malignant neoplasm of digestive organs: Secondary | ICD-10-CM

## 2016-06-05 MED ORDER — SODIUM CHLORIDE 0.9 % IV SOLN: 500.0000 mL | INTRAVENOUS | Status: DC

## 2016-06-05 NOTE — Progress Notes (Signed)
Pt's states no medical or surgical changes since previsit or office visit. 

## 2016-06-05 NOTE — Op Note (Signed)
[facility] Patient Name: Brittany [name] Procedure Date: 06/05/2016 10:56 AM MRN: [id number] Endoscopist: Ladene Artist , MD Age: 51 Referring MD:  Date of Birth: [birth year] Gender: Female Account #: [id number] Procedure:                Colonoscopy Indications:              Colon cancer screening in patient at increased                            risk: Family history of 1st-degree relative with                            colon polyps, Colon cancer screening in patient at                            increased risk: Family history of colorectal cancer                            in multiple 2nd degree relatives Medicines:                Monitored Anesthesia Care Procedure:                Pre-Anesthesia Assessment:                           - Prior to the procedure, a History and Physical                            was performed, and patient medications and                            allergies were reviewed. The patient's tolerance of                            previous anesthesia was also reviewed. The risks                            and benefits of the procedure and the sedation                            options and risks were discussed with the patient.                            All questions were answered, and informed consent                            was obtained. Prior Anticoagulants: The patient has                            taken no previous anticoagulant or antiplatelet                            agents. ASA Grade Assessment: II - A patient with  mild systemic disease. After reviewing the risks                            and benefits, the patient was deemed in                            satisfactory condition to undergo the procedure.                           After obtaining informed consent, the colonoscope                            was passed under direct vision. Throughout the                            procedure, the patient's blood  pressure, pulse, and                            oxygen saturations were monitored continuously. The                            Model PCF-H190L [id number]) scope was introduced                            through the anus and advanced to the the cecum,                            identified by appendiceal orifice and ileocecal                            valve. The ileocecal valve, appendiceal orifice,                            and rectum were photographed. The quality of the                            bowel preparation was excellent. The colonoscopy                            was performed without difficulty. The patient                            tolerated the procedure well. Scope In: 11:05:28 AM Scope Out: 11:18:48 AM Scope Withdrawal Time: 0 hours 8 minutes 8 seconds  Total Procedure Duration: 0 hours 13 minutes 20 seconds  Findings:                 The perianal and digital rectal examinations were                            normal.                           Internal hemorrhoids were found during  retroflexion. The hemorrhoids were small and Grade                            I (internal hemorrhoids that do not prolapse).                           Many small-mouthed diverticula were found in the                            left colon. There was no evidence of diverticular                            bleeding.                           The exam was otherwise without abnormality on                            direct and retroflexion views. Complications:            No immediate complications. Estimated blood loss:                            None. Estimated Blood Loss:     Estimated blood loss: none. Impression:               - Internal hemorrhoids.                           - Moderate diverticulosis in the left colon.                           - The examination was otherwise normal on direct                            and retroflexion views.                            - No specimens collected. Recommendation:           - Repeat colonoscopy in 5 years for screening                            purposes.                           - Patient has a contact number available for                            emergencies. The signs and symptoms of potential                            delayed complications were discussed with the                            patient. Return to normal activities tomorrow.  Written discharge instructions were provided to the                            patient.                           - Continue present medications.                           - Await pathology results.                           - High fiber diet indefinitely. Ladene Artist, MD 06/05/2016 11:22:40 AM This report has been signed electronically.

## 2016-06-05 NOTE — Progress Notes (Signed)
A/ox3 pleased with MAC, report to Kristen RN 

## 2016-06-05 NOTE — Patient Instructions (Signed)
YOU HAD AN ENDOSCOPIC PROCEDURE TODAY AT [facility]:   Refer to the procedure report that was given to you for any specific questions about what was found during the examination.  If the procedure report does not answer your questions, please call your gastroenterologist to clarify.  If you requested that your care partner not be given the details of your procedure findings, then the procedure report has been included in a sealed envelope for you to review at your convenience later.  YOU SHOULD EXPECT: Some feelings of bloating in the abdomen. Passage of more gas than usual.  Walking can help get rid of the air that was put into your GI tract during the procedure and reduce the bloating. If you had a lower endoscopy (such as a colonoscopy or flexible sigmoidoscopy) you may notice spotting of blood in your stool or on the toilet paper. If you underwent a bowel prep for your procedure, you may not have a normal bowel movement for a few days.  Please Note:  You might notice some irritation and congestion in your nose or some drainage.  This is from the oxygen used during your procedure.  There is no need for concern and it should clear up in a day or so.  SYMPTOMS TO REPORT IMMEDIATELY:   Following lower endoscopy (colonoscopy or flexible sigmoidoscopy):  Excessive amounts of blood in the stool  Significant tenderness or worsening of abdominal pains  Swelling of the abdomen that is new, acute  Fever of 100F or higher  For urgent or emergent issues, a gastroenterologist can be reached at any hour by calling [phone].  DIET:  We do recommend a small meal at first, but then you may proceed to your regular diet.  Drink plenty of fluids but you should avoid alcoholic beverages for 24 hours.  ACTIVITY:  You should plan to take it easy for the rest of today and you should NOT DRIVE or use heavy machinery until tomorrow (because of the sedation medicines used during the test).     FOLLOW UP: Our staff will call the number listed on your records the next business day following your procedure to check on you and address any questions or concerns that you may have regarding the information given to you following your procedure. If we do not reach you, we will leave a message.  However, if you are feeling well and you are not experiencing any problems, there is no need to return our call.  We will assume that you have returned to your regular daily activities without incident.  SIGNATURES/CONFIDENTIALITY: You and/or your care partner have signed paperwork which will be entered into your electronic medical record.  These signatures attest to the fact that that the information above on your After Visit Summary has been reviewed and is understood.  Full responsibility of the confidentiality of this discharge information lies with you and/or your care-partner.  Next colonoscopy=5 years  Please read over handouts about hemorrhoids, diverticulosis and high fiber diets  High fiber diet indefinitely  Continue your normal medications

## 2016-06-06 ENCOUNTER — Telehealth: Payer: Self-pay | Admitting: *Deleted

## 2016-06-06 NOTE — Telephone Encounter (Signed)
  Follow up Call-  Call back number 06/05/2016  Post procedure Call Back phone  # [phone]  Permission to leave phone message Yes  Some recent data might be hidden     Patient questions:  Do you have a fever, pain , or abdominal swelling? No. Pain Score  0 *  Have you tolerated food without any problems? Yes.    Have you been able to return to your normal activities? Yes.    Do you have any questions about your discharge instructions: Diet   No. Medications  No. Follow up visit  No.  Do you have questions or concerns about your Care? No.  Actions: * If pain score is 4 or above: No action needed, pain <4.

## 2016-06-16 ENCOUNTER — Ambulatory Visit (HOSPITAL_COMMUNITY): Payer: Self-pay | Admitting: Clinical

## 2016-06-24 ENCOUNTER — Other Ambulatory Visit (HOSPITAL_COMMUNITY): Payer: Self-pay | Admitting: Psychiatry

## 2016-06-24 DIAGNOSIS — F332 Major depressive disorder, recurrent severe without psychotic features: Secondary | ICD-10-CM

## 2016-06-27 ENCOUNTER — Emergency Department: Admit: 2016-06-27 | Payer: PRIVATE HEALTH INSURANCE | Primary: Family Medicine

## 2016-06-27 ENCOUNTER — Emergency Department: Admit: 2016-06-28 | Payer: PRIVATE HEALTH INSURANCE | Primary: Family Medicine

## 2016-06-27 ENCOUNTER — Emergency Department: Payer: PRIVATE HEALTH INSURANCE | Primary: Family Medicine

## 2016-06-27 DIAGNOSIS — K430 Incisional hernia with obstruction, without gangrene: Secondary | ICD-10-CM

## 2016-06-27 NOTE — ED Notes (Signed)
Pt hourly rounding competed.  Safety   Pt (x) resting on stretcher with side rails up and call bell in reach.    () in chair    () in parents arms.  Toileting   Pt offered ()Bedpan     (x)Assistance to Restroom: declines any need at this time     ()Urinal  Ongoing Updates  Updated on plan of care and status of test results.  Pain Management  Inquired as to comfort and offered comfort measures:    (x) warm blankets   () dimmed lights

## 2016-06-27 NOTE — ED Provider Notes (Signed)
EMERGENCY DEPARTMENT HISTORY AND PHYSICAL EXAM    Date: 06/27/2016  Patient Name: Lori Delgado    History of Presenting Illness     Chief Complaint   Patient presents with   ??? Abdominal Pain         History Provided By: Patient    Chief Complaint: Periumbilical and epigastric abdominal pain  Duration: A few days   Timing:  Intermittent and Worsening  Location: Periumbilical and epigastric abdomen  Severity: 10 out of 10  Associated Symptoms: nausea, diarrhea (yesterday), chills, and decreased urinary frequency    Additional History (Context):   9:00 PM  Lori Delgado is a 51 y.o. female with PMHx bowel obstruction and PSHx of hysterectomy, tubal ligation, and gastric bypass who presents ambulatory to the emergency department C/O intermittent, worsening, periumbilical and epigastric abdominal pain (rated 10/10), onset a few days. Associated sxs include nausea, diarrhea (yesterday), chills, and decreased urinary frequency. Pt reports that her sxs began with pain. Last BM was 1 day ago, normally has 1 daily. Pt drank water and coffee today. Of note, she reports that her father passed away yesterday. Endorses tobacco use. Notes FHx of non cancerous (mother) and cancerous (maternal grandfather) colon polyps and IBS (mother). Denies any ulcerative colitis or Crohn's. Pt denies vomiting, fever, CP, burning sensation, SOB, leg swelling, illicit drug use, or any other sxs or complaints.     PCP: Phys Other, MD    Current Facility-Administered Medications   Medication Dose Route Frequency Provider Last Rate Last Dose   ??? 0.9% sodium chloride infusion  150 mL/hr IntraVENous CONTINUOUS Luvenia Redden, MD       ??? morphine injection 2 mg  2 mg IntraVENous Q2H PRN Luvenia Redden, MD       ??? ondansetron [facility]) injection 4 mg  4 mg IntraVENous Q4H PRN Luvenia Redden, MD         Current Outpatient Prescriptions   Medication Sig Dispense Refill   ??? DULoxetine (CYMBALTA) 60 mg capsule Take 60 mg by mouth two (2) times a day.      ??? QUEtiapine SR (SEROQUEL XR) 150 mg sr tablet Take  by mouth nightly.     ??? multivitamin (ONE A DAY) tablet Take 1 Tab by mouth daily.     ??? calcium-cholecalciferol, d3, (CALCIUM 600 + D) 600-125 mg-unit tab Take  by mouth.     ??? cholecalciferol, vitamin D3, (VITAMIN D3) 2,000 unit tab Take  by mouth.     ??? cyanocobalamin 1,000 mcg tablet Take 1,000 mcg by mouth daily.         Past History     Past Medical History:  Past Medical History:   Diagnosis Date   ??? Bowel obstruction    ??? Chlamydia        Past Surgical History:  Past Surgical History:   Procedure Laterality Date   ??? HX GASTRIC BYPASS     ??? HX HYSTERECTOMY         Family History:  History reviewed. No pertinent family history.    Social History:  Social History   Substance Use Topics   ??? Smoking status: None   ??? Smokeless tobacco: None   ??? Alcohol use None       Allergies:  Allergies   Allergen Reactions   ??? Amoxil [Amoxicillin] Hives   ??? Bactrim [Sulfamethoprim] Hives   ??? Lamictal [Lamotrigine] Hives         Review of Systems   Review  of Systems   Constitutional: Positive for chills. Negative for diaphoresis, fever and unexpected weight change.   HENT: Negative for congestion, drooling, ear pain, rhinorrhea, sore throat, tinnitus and trouble swallowing.    Eyes: Negative for photophobia, pain, redness and visual disturbance.   Respiratory: Negative for cough, choking, chest tightness, shortness of breath, wheezing and stridor.    Cardiovascular: Negative for chest pain, palpitations and leg swelling.   Gastrointestinal: Positive for abdominal pain (periumbilical and epigastric), diarrhea and nausea. Negative for abdominal distention, anal bleeding, blood in stool, constipation and vomiting.   Genitourinary: Positive for frequency (decreased). Negative for difficulty urinating, dysuria, flank pain, hematuria and urgency.   Musculoskeletal: Negative for arthralgias, back pain and neck pain.   Skin: Negative for color change, rash and wound.    Neurological: Negative for dizziness, seizures, syncope, speech difficulty, light-headedness and headaches.   Hematological: Does not bruise/bleed easily.   Psychiatric/Behavioral: Negative for agitation, behavioral problems, hallucinations, self-injury and suicidal ideas. The patient is not hyperactive.        Physical Exam     Vitals:    06/27/16 1930   BP: (!) 155/109   Pulse: 67   Resp: 16   Temp: 98 ??F (36.7 ??C)   SpO2: 99%   Weight: 113.4 kg (250 lb)   Height: '5\' 10"'  (1.778 m)     Physical Exam   Constitutional: She is oriented to person, place, and time. She appears well-developed and well-nourished. No distress.   Uncomfortable appearing but nontoxic   HENT:   Head: Normocephalic and atraumatic.   Right Ear: External ear normal.   Left Ear: External ear normal.   Mouth/Throat: Oropharynx is clear and moist. No oropharyngeal exudate.   Eyes: Conjunctivae and EOM are normal. Pupils are equal, round, and reactive to light. No scleral icterus.   No pallor, no icterus, no jaundice   Neck: Normal range of motion. Neck supple. No JVD present. No tracheal deviation present. No thyromegaly present.   Cardiovascular: Normal rate, regular rhythm and normal heart sounds.    Pulmonary/Chest: Effort normal and breath sounds normal. No stridor. No respiratory distress.   Abdominal: Soft. She exhibits distension. Bowel sounds are decreased. There is no tenderness. There is no rebound, no guarding and no CVA tenderness.   Mild to moderate distention but no tympany, hypoactive bowel sounds, no real CVA tenderness.   Musculoskeletal: Normal range of motion. She exhibits no edema or tenderness.   No soft tissue injuries   Lymphadenopathy:     She has no cervical adenopathy.   Neurological: She is alert and oriented to person, place, and time. She has normal reflexes. No cranial nerve deficit. Coordination normal.   Skin: Skin is warm and dry. No rash noted. She is not diaphoretic. No erythema.    Psychiatric: She has a normal mood and affect. Her behavior is normal. Judgment and thought content normal.   Nursing note and vitals reviewed.    Diagnostic Study Results     Labs -     Recent Results (from the past 12 hour(s))   URINALYSIS W/ RFLX MICROSCOPIC    Collection Time: 06/27/16  8:22 PM   Result Value Ref Range    Color YELLOW      Appearance CLEAR      Specific gravity 1.022 1.005 - 1.030      pH (UA) 6.0 5.0 - 8.0      Protein NEGATIVE  NEG mg/dL    Glucose NEGATIVE  NEG  mg/dL    Ketone 15 (A) NEG mg/dL    Bilirubin NEGATIVE  NEG      Blood NEGATIVE  NEG      Urobilinogen 1.0 0.2 - 1.0 EU/dL    Nitrites NEGATIVE  NEG      Leukocyte Esterase NEGATIVE  NEG     CBC WITH AUTOMATED DIFF    Collection Time: 06/27/16  8:34 PM   Result Value Ref Range    WBC 6.3 4.6 - 13.2 K/uL    RBC 3.79 (L) 4.20 - 5.30 M/uL    HGB 11.7 (L) 12.0 - 16.0 g/dL    HCT 35.1 35.0 - 45.0 %    MCV 92.6 74.0 - 97.0 FL    MCH 30.9 24.0 - 34.0 PG    MCHC 33.3 31.0 - 37.0 g/dL    RDW 12.9 11.6 - 14.5 %    PLATELET 338 135 - 420 K/uL    MPV 9.2 9.2 - 11.8 FL    NEUTROPHILS 55 40 - 73 %    LYMPHOCYTES 35 21 - 52 %    MONOCYTES 8 3 - 10 %    EOSINOPHILS 2 0 - 5 %    BASOPHILS 0 0 - 2 %    ABS. NEUTROPHILS 3.5 1.8 - 8.0 K/UL    ABS. LYMPHOCYTES 2.2 0.9 - 3.6 K/UL    ABS. MONOCYTES 0.5 0.05 - 1.2 K/UL    ABS. EOSINOPHILS 0.1 0.0 - 0.4 K/UL    ABS. BASOPHILS 0.0 0.0 - 0.06 K/UL    DF AUTOMATED     METABOLIC PANEL, COMPREHENSIVE    Collection Time: 06/27/16  8:34 PM   Result Value Ref Range    Sodium 142 136 - 145 mmol/L    Potassium 3.8 3.5 - 5.5 mmol/L    Chloride 105 100 - 108 mmol/L    CO2 28 21 - 32 mmol/L    Anion gap 9 3.0 - 18 mmol/L    Glucose 92 74 - 99 mg/dL    BUN 16 7.0 - 18 MG/DL    Creatinine 0.74 0.6 - 1.3 MG/DL    BUN/Creatinine ratio 22 (H) 12 - 20      GFR est AA >60 >60 ml/min/1.62m    GFR est non-AA >60 >60 ml/min/1.750m   Calcium 8.9 8.5 - 10.1 MG/DL    Bilirubin, total 0.4 0.2 - 1.0 MG/DL    ALT (SGPT) 20 13 - 56 U/L     AST (SGOT) 8 (L) 15 - 37 U/L    Alk. phosphatase 90 45 - 117 U/L    Protein, total 7.4 6.4 - 8.2 g/dL    Albumin 3.7 3.4 - 5.0 g/dL    Globulin 3.7 2.0 - 4.0 g/dL    A-G Ratio 1.0 0.8 - 1.7     CARDIAC PANEL,(CK, CKMB & TROPONIN)    Collection Time: 06/27/16  8:34 PM   Result Value Ref Range    CK 80 26 - 192 U/L    CK - MB <1.0 <3.6 ng/ml    CK-MB Index  0.0 - 4.0 %     CALCULATION NOT PERFORMED WHEN RESULT IS BELOW LINEAR LIMIT    Troponin-I, Qt. <0.02 0.00 - 0.06 NG/ML   LIPASE    Collection Time: 06/27/16  8:34 PM   Result Value Ref Range    Lipase 79 73 - 393 U/L   LACTIC ACID    Collection Time: 06/28/16 12:04 AM   Result Value Ref  Range    Lactic acid 0.6 0.4 - 2.0 MMOL/L       Radiologic Studies -    XR ABD FLAT/ ERECT    (Results Pending)     9:16 PM  RADIOLOGY FINDINGS  Abdominal X-ray shows enlarged gas loop in RUQ, but no air fluid levels  Pending review by Radiologist  Recorded by Audelia Hives, ED Scribe, as dictated by Leonie Douglas. Scot Dock, MD    CT Results  (Last 48 hours)               06/27/16 2317  CT ABD PELV W CONT Final result    Impression:  IMPRESSION:       1.  There is an abnormal loop of thickened terminal ileum with infiltration of   the adjacent mesentery. This raises the possibility of ischemic bowel. An   internal hernia is a consideration. I would advise correlation with lactate   levels along with surgical consultation. No resultant small bowel obstruction.   No pneumatosis or portal venous gas or free air seen. Dr. Scot Dock informed 11:56   PM June 27, 2016       Small hiatal hernia with postsurgical changes from gastric bypass surgery       Probable hemangioma in the left hepatic lobe      As read by the radiologist.       Narrative:  EXAM: CT of the abdomen and pelvis       INDICATION: Abdominal pain       COMPARISON: None.       TECHNIQUE: Axial CT imaging of the abdomen and pelvis was performed with   intravenous contrast. Multiplanar reformats were generated.        DOSE REDUCTION:  One or more dose reduction techniques were used on this CT:   automated exposure control, adjustment of the mAs and/or kVp according to   patient's size, and iterative reconstruction techniques. The specific techniques   utilized on this CT exam have been documented in the patient's electronic   medical record.       _______________       FINDINGS:       LOWER CHEST: Unremarkable.       LIVER, BILIARY: There is an 18 x 27 mm enhancing lesion in the lateral segment   left hepatic lobe suggesting a hemangioma. There is mild hepatic steatosis. No   biliary dilation. Gallbladder is unremarkable.       PANCREAS: Normal.       SPLEEN: Normal.       ADRENALS: Normal.       KIDNEYS/URETERS: Normal.       VASCULATURE: There is a retroaortic left renal vein. Aorta is normal in caliber.       LYMPH NODES: There are no enlarged lymph nodes       GASTRO INTESTINAL TRACT: There are postoperative changes from gastric bypass   surgery.       Along the midline of the abdomen there is thinning of the anterior abdominal   wall with bulging of bowel loops between the rectus muscle sheath. In the right   lower quadrant there is a loop of terminal ileum which appears to be thickened   with infiltration of the adjacent mesentery best appreciated on images #75   through 83. There is fecal formation in the terminal ileum. These findings raise   the possibility of bowel inflammation or ischemia. There is no proximal small   bowel obstruction. I  would advise correlation with lactate levels along with   surgical consultation.        PELVIC ORGANS/BLADDER: Hysterectomy. The urinary bladder is unremarkable. There   is no free fluid.       BONES: No acute or aggressive osseous abnormalities identified.       OTHER: There is no pneumatosis or portal venous gas or free air       _______________               CXR Results  (Last 48 hours)    None          MEDICATIONS GIVEN:  Medications    0.9% sodium chloride infusion (not administered)   morphine injection 2 mg (not administered)   ondansetron (ZOFRAN) injection 4 mg (not administered)   iohexol (OMNIPAQUE) ORAL mixture ( Oral Given 06/27/16 2120)   butorphanol (STADOL) injection 1 mg (1 mg IntraVENous Given 06/27/16 2127)   ondansetron (ZOFRAN) injection 4 mg (4 mg IntraVENous Given 06/27/16 2120)   sodium chloride 0.9 % bolus infusion 1,000 mL (0 mL IntraVENous IV Completed 06/27/16 2238)   iopamidol (ISOVUE 300) 61 % contrast injection 100 mL (100 mL IntraVENous Given 06/27/16 2258)       Medical Decision Making   I am the first provider for this patient.    I reviewed the vital signs, available nursing notes, past medical history, past surgical history, family history and social history.    Vital Signs-Reviewed the patient's vital signs.    Pulse Oximetry Analysis - 99% on RA     Records Reviewed: Nursing Notes    Provider Notes (Medical Decision Making):   Ddx: Possible, but unlikely to be bariatric complication. Likely to be related to scar tissue or adhesions. No current findings of bowel obstruction. Will obtain CT while also screening for pancreas, liver disease, and, very unlikely, include screening for cardiac disease.    Procedures:  Procedures    ED Course:   9:00 PM   Initial assessment performed. The patients presenting problems have been discussed, and they are in agreement with the care plan formulated and outlined with them.  I have encouraged them to ask questions as they arise throughout their visit.    11:55 PM  Radiologist called back reporting concerns of ischemic bowel, possibly from internal hernia. Will check lactate and discussed with hospitalist for admission and observation for possible surgery.    CONSULT NOTE:   12:51 AM  Luvenia Redden, MD spoke with Rex Kras, MD   Specialty: General Surgery  Discussed pt's hx, disposition, and available diagnostic and imaging  results. Reviewed care plans. Consulting physician agrees with plans as outlined.  Asked we admit to his service, will provide IV fluids. Will see her in the morning. NPO. No current indications for emergent surgery intervention..   Written by Audelia Hives, ED Scribe, as dictated by Luvenia Redden, MD.      Diagnosis and Disposition       Critical Care Time: None    Core Measures:  For Hospitalized Patients:    1. Hospitalization Decision Time:  The decision to hospitalize the patient was made by Leonie Douglas. Scot Dock, MD at 1:16 AM on 06/27/2016    2. Aspirin: Aspirin was not given because the patient did not present with a stroke at the time of their Emergency Department evaluation    1:16 AM  Patient is being admitted to the hospital by Rex Kras, MD. The results of their tests  and reasons for their admission have been discussed with them and/or available family. They convey agreement and understanding for the need to be admitted and for their admission diagnosis.      CONDITIONS ON ADMISSION:  Sepsis is not present at the time of admission.Deep Vein Thrombosis is not present at the time of admission. Thrombosis is not present at the time of admission. Urinary Tract Infection is not present at the time of admission. Pneumonia is not present at the time of admission. MRSA is not present at the time of admission. Wound infection is not present at the time of admission. Pressure Ulcer is not present at the time of admission.      CLINICAL IMPRESSION:    1. Abdominal pain, generalized    2. Ventral hernia without obstruction or gangrene        PLAN: Admit to Medical  _______________________________    Attestations:  This note is prepared by Audelia Hives, acting as Education administrator for [employer]. Scot Dock, MD.    Leonie Douglas. Scot Dock, MD:  The scribe's documentation has been prepared under my direction and personally reviewed by me in its entirety.  I confirm that the note above accurately reflects all work, treatment,  procedures, and medical decision making performed by me.  _______________________________

## 2016-06-27 NOTE — ED Triage Notes (Signed)
Pt reports hx of bowel blockages. Pain in upper abd that is now traveling downwards that began a few days ago. States that this is very similar. Reports N/V and had diarrhea yesterday. Sepsis Screening completed    (  )Patient meets SIRS criteria.  ( xxx )Patient does not meet SIRS criteria.      SIRS Criteria is achieved when two or more of the following are present  ? Temperature < 96.8??F (36??C) or > 100.9??F (38.3??C)  ? Heart Rate > 90 beats per minute  ? Respiratory Rate > 20 breaths per minute  ? WBC count > 12,000 or <4,000 or > 10% bands

## 2016-06-27 NOTE — ED Notes (Signed)
Patient medicated per MAR orders.  Verified order, patient identification, and allergies prior to administration.    Call bell within reach of patient. Will continue to monitor and assess.

## 2016-06-27 NOTE — ED Notes (Signed)
Pt hourly rounding competed.  Safety   Pt (x) resting on stretcher with side rails up and call bell in reach.    () in chair    () in parents arms.  Toileting   Pt offered ()Bedpan     (x)Assistance to Restroom: declines need at this time       ()Urinal  Ongoing Updates  Updated on plan of care and status of test results.  Pain Management  Inquired as to comfort and offered comfort measures:    (x) warm blankets   () dimmed lights

## 2016-06-27 NOTE — ED Notes (Signed)
Patient completed half of omnipaque, CT called and informed.

## 2016-06-27 NOTE — ED Notes (Addendum)
Assumed care of patient.    Patient presents with complaints of abdominal pain, onset a few days ago. Patient reports history of small bowel blockage in 2015. Patient reports pain was minimal the last few days but reports pain getting worse today. Patient c/o nausea, no vomiting or fever.    Patient changed into gown and warm blankets provided.    Call bell within reach of patient.  Will continue to monitor and assess.

## 2016-06-28 ENCOUNTER — Inpatient Hospital Stay
Admit: 2016-06-28 | Discharge: 2016-06-29 | Disposition: A | Discharge status: Home / Self Care | Payer: PRIVATE HEALTH INSURANCE | Attending: Surgery | Admitting: Surgery

## 2016-06-28 LAB — CBC WITH AUTOMATED DIFF
ABS. BASOPHILS: 0 10*3/uL (ref 0.0–0.06)
ABS. EOSINOPHILS: 0.1 10*3/uL (ref 0.0–0.4)
ABS. LYMPHOCYTES: 2.2 10*3/uL (ref 0.9–3.6)
ABS. MONOCYTES: 0.5 10*3/uL (ref 0.05–1.2)
ABS. NEUTROPHILS: 3.5 10*3/uL (ref 1.8–8.0)
BASOPHILS: 0 % (ref 0–2)
EOSINOPHILS: 2 % (ref 0–5)
HCT: 35.1 % (ref 35.0–45.0)
HGB: 11.7 g/dL — ABNORMAL LOW (ref 12.0–16.0)
LYMPHOCYTES: 35 % (ref 21–52)
MCH: 30.9 PG (ref 24.0–34.0)
MCHC: 33.3 g/dL (ref 31.0–37.0)
MCV: 92.6 FL (ref 74.0–97.0)
MONOCYTES: 8 % (ref 3–10)
MPV: 9.2 FL (ref 9.2–11.8)
NEUTROPHILS: 55 % (ref 40–73)
PLATELET: 338 10*3/uL (ref 135–420)
RBC: 3.79 M/uL — ABNORMAL LOW (ref 4.20–5.30)
RDW: 12.9 % (ref 11.6–14.5)
WBC: 6.3 10*3/uL (ref 4.6–13.2)

## 2016-06-28 LAB — METABOLIC PANEL, COMPREHENSIVE
A-G Ratio: 1 (ref 0.8–1.7)
ALT (SGPT): 20 U/L (ref 13–56)
AST (SGOT): 8 U/L — ABNORMAL LOW (ref 15–37)
Albumin: 3.7 g/dL (ref 3.4–5.0)
Alk. phosphatase: 90 U/L (ref 45–117)
Anion gap: 9 mmol/L (ref 3.0–18)
BUN/Creatinine ratio: 22 — ABNORMAL HIGH (ref 12–20)
BUN: 16 MG/DL (ref 7.0–18)
Bilirubin, total: 0.4 MG/DL (ref 0.2–1.0)
CO2: 28 mmol/L (ref 21–32)
Calcium: 8.9 MG/DL (ref 8.5–10.1)
Chloride: 105 mmol/L (ref 100–108)
Creatinine: 0.74 MG/DL (ref 0.6–1.3)
GFR est AA: >60 mL/min/{1.73_m2} (ref >60)
GFR est non-AA: >60 mL/min/{1.73_m2} (ref >60)
Globulin: 3.7 g/dL (ref 2.0–4.0)
Glucose: 92 mg/dL (ref 74–99)
Potassium: 3.8 mmol/L (ref 3.5–5.5)
Protein, total: 7.4 g/dL (ref 6.4–8.2)
Sodium: 142 mmol/L (ref 136–145)

## 2016-06-28 LAB — URINALYSIS W/ RFLX MICROSCOPIC
Bilirubin: NEGATIVE
Blood: NEGATIVE
Glucose: NEGATIVE mg/dL
Ketone: 15 mg/dL — AB
Leukocyte Esterase: NEGATIVE
Nitrites: NEGATIVE
Protein: NEGATIVE mg/dL
Specific gravity: 1.022 (ref 1.005–1.030)
Urobilinogen: 1 EU/dL (ref 0.2–1.0)
pH (UA): 6 (ref 5.0–8.0)

## 2016-06-28 LAB — LACTIC ACID: Lactic acid: 0.6 MMOL/L (ref 0.4–2.0)

## 2016-06-28 LAB — CARDIAC PANEL,(CK, CKMB & TROPONIN)
CK - MB: <1 ng/ml (ref <3.6)
CK: 80 U/L (ref 26–192)
Troponin-I, QT: <0.02 NG/ML (ref 0.00–0.06)

## 2016-06-28 LAB — LIPASE: Lipase: 79 U/L (ref 73–393)

## 2016-06-28 MED ORDER — D5-1/2 NS & POTASSIUM CHLORIDE 20 MEQ/L IV
20 mEq/L | INTRAVENOUS | Status: DC
Start: 2016-06-28 — End: 2016-06-29
  Administered 2016-06-28 (×2): via INTRAVENOUS

## 2016-06-28 MED ORDER — SODIUM CHLORIDE 0.9 % IJ SYRG
INTRAMUSCULAR | Status: DC | PRN
Start: 2016-06-28 — End: 2016-06-29

## 2016-06-28 MED ORDER — MORPHINE 2 MG/ML INJECTION
2 mg/mL | INTRAMUSCULAR | Status: DC | PRN
Start: 2016-06-28 — End: 2016-06-29

## 2016-06-28 MED ORDER — ONDANSETRON (PF) 4 MG/2 ML INJECTION
4 mg/2 mL | INTRAMUSCULAR | Status: DC | PRN
Start: 2016-06-28 — End: 2016-06-29

## 2016-06-28 MED ORDER — BUTORPHANOL TARTRATE 2 MG/ML IJ SOLN
2 mg/mL | INTRAMUSCULAR | Status: AC
Start: 2016-06-28 — End: 2016-06-27
  Administered 2016-06-28: 01:00:00 via INTRAVENOUS

## 2016-06-28 MED ORDER — IOHEXOL 240 MG/ML IV SOLN
240 mg iodine/mL | INTRAVENOUS | Status: AC
Start: 2016-06-28 — End: 2016-06-27
  Administered 2016-06-28: 01:00:00 via ORAL

## 2016-06-28 MED ORDER — ONDANSETRON (PF) 4 MG/2 ML INJECTION
4 mg/2 mL | INTRAMUSCULAR | Status: AC
Start: 2016-06-28 — End: 2016-06-28
  Administered 2016-06-28: 06:00:00 via INTRAVENOUS

## 2016-06-28 MED ORDER — KETOROLAC TROMETHAMINE 30 MG/ML INJECTION
30 mg/mL (1 mL) | Freq: Four times a day (QID) | INTRAMUSCULAR | Status: AC | PRN
Start: 2016-06-28 — End: 2016-06-29
  Administered 2016-06-28 (×2): via INTRAVENOUS

## 2016-06-28 MED ORDER — OXYCODONE-ACETAMINOPHEN 5 MG-325 MG TAB
5-325 mg | ORAL | Status: DC | PRN
Start: 2016-06-28 — End: 2016-06-29

## 2016-06-28 MED ORDER — SODIUM CHLORIDE 0.9% BOLUS IV
0.9 % | Freq: Once | INTRAVENOUS | Status: AC
Start: 2016-06-28 — End: 2016-06-27
  Administered 2016-06-28: 01:00:00 via INTRAVENOUS

## 2016-06-28 MED ORDER — DIPHENHYDRAMINE HCL 50 MG/ML IJ SOLN
50 mg/mL | INTRAMUSCULAR | Status: DC | PRN
Start: 2016-06-28 — End: 2016-06-29
  Administered 2016-06-29: 12:00:00 via INTRAVENOUS

## 2016-06-28 MED ORDER — SODIUM CHLORIDE 0.9 % IV
INTRAVENOUS | Status: DC
Start: 2016-06-28 — End: 2016-06-29
  Administered 2016-06-28: 06:00:00 via INTRAVENOUS

## 2016-06-28 MED ORDER — ONDANSETRON (PF) 4 MG/2 ML INJECTION
4 mg/2 mL | Freq: Once | INTRAMUSCULAR | Status: AC
Start: 2016-06-28 — End: 2016-06-27
  Administered 2016-06-28: 01:00:00 via INTRAVENOUS

## 2016-06-28 MED ORDER — IOPAMIDOL 61 % IV SOLN
300 mg iodine /mL (61 %) | Freq: Once | INTRAVENOUS | Status: AC
Start: 2016-06-28 — End: 2016-06-27
  Administered 2016-06-28: 03:00:00 via INTRAVENOUS

## 2016-06-28 MED ORDER — MORPHINE 2 MG/ML INJECTION
2 mg/mL | INTRAMUSCULAR | Status: AC
Start: 2016-06-28 — End: 2016-06-28
  Administered 2016-06-28: 06:00:00 via INTRAVENOUS

## 2016-06-28 MED ORDER — QUETIAPINE 100 MG TAB
100 mg | Freq: Every evening | ORAL | Status: DC
Start: 2016-06-28 — End: 2016-06-29
  Administered 2016-06-28 – 2016-06-29 (×2): via ORAL

## 2016-06-28 MED ORDER — SODIUM CHLORIDE 0.9 % IJ SYRG
Freq: Three times a day (TID) | INTRAMUSCULAR | Status: DC
Start: 2016-06-28 — End: 2016-06-29
  Administered 2016-06-28 – 2016-06-29 (×3): via INTRAVENOUS

## 2016-06-28 MED FILL — SODIUM CHLORIDE 0.9 % IV: INTRAVENOUS | Qty: 1000

## 2016-06-28 MED FILL — MORPHINE 2 MG/ML INJECTION: 2 mg/mL | INTRAMUSCULAR | Qty: 1

## 2016-06-28 MED FILL — ISOVUE-300  61 % INTRAVENOUS SOLUTION: 300 mg iodine /mL (61 %) | INTRAVENOUS | Qty: 100

## 2016-06-28 MED FILL — BD POSIFLUSH NORMAL SALINE 0.9 % INJECTION SYRINGE: INTRAMUSCULAR | Qty: 10

## 2016-06-28 MED FILL — BUTORPHANOL TARTRATE 2 MG/ML IJ SOLN: 2 mg/mL | INTRAMUSCULAR | Qty: 1

## 2016-06-28 MED FILL — KETOROLAC TROMETHAMINE 30 MG/ML INJECTION: 30 mg/mL (1 mL) | INTRAMUSCULAR | Qty: 1

## 2016-06-28 MED FILL — ONDANSETRON (PF) 4 MG/2 ML INJECTION: 4 mg/2 mL | INTRAMUSCULAR | Qty: 2

## 2016-06-28 MED FILL — OMNIPAQUE 240 MG IODINE/ML INTRAVENOUS SOLUTION: 240 mg iodine/mL | INTRAVENOUS | Qty: 50

## 2016-06-28 MED FILL — D5-1/2 NS & POTASSIUM CHLORIDE 20 MEQ/L IV: 20 mEq/L | INTRAVENOUS | Qty: 1000

## 2016-06-28 MED FILL — QUETIAPINE 25 MG TAB: 25 mg | ORAL | Qty: 2

## 2016-06-28 NOTE — Other (Signed)
Bedside and Verbal shift change report given to Wenda LowJasmine L, RN (Cabin crewoncoming nurse) by Charlton AmorIrene B RN (offgoing nurse). Report included the following information SBAR, Kardex, Procedure Summary, Intake/Output, MAR, Accordion and Recent Results.

## 2016-06-28 NOTE — ED Notes (Signed)
TRANSFER - OUT REPORT:    Verbal report given to Rachell CiproMelissa Estepa, RN on Lori Delgado  being transferred to 325 (medical unit) for routine progression of care       Report consisted of patient???s Situation, Background, Assessment and   Recommendations(SBAR).     Information from the following report(s) SBAR, ED Summary, [facility] and Recent Results was reviewed with the receiving nurse.    Lines:   Peripheral IV 06/27/16 Right Hand (Active)   Site Assessment Clean, dry, & intact 06/27/2016  8:35 PM   Phlebitis Assessment 0 06/27/2016  8:35 PM   Infiltration Assessment 0 06/27/2016  8:35 PM   Dressing Status Clean, dry, & intact 06/27/2016  8:35 PM   Dressing Type Tape;Transparent 06/27/2016  8:35 PM   Hub Color/Line Status Pink;Flushed 06/27/2016  8:35 PM   Action Taken Blood drawn 06/27/2016  8:35 PM       Peripheral IV 06/28/16 Left Arm (Active)   Site Assessment Clean, dry, & intact 06/28/2016 12:13 AM   Phlebitis Assessment 0 06/28/2016 12:13 AM   Infiltration Assessment 0 06/28/2016 12:13 AM   Dressing Status Clean, dry, & intact 06/28/2016 12:13 AM   Dressing Type 4 X 4;Transparent 06/28/2016 12:13 AM   Hub Color/Line Status Pink 06/28/2016 12:13 AM   Alcohol Cap Used Yes 06/28/2016 12:13 AM        Opportunity for questions and clarification was provided.      Patient transported with:   [employer]

## 2016-06-28 NOTE — Other (Signed)
TRANSFER - IN REPORT:    Verbal report received from Juanita CraverKatie Ferguson, RN (name) on Lori Delgado  being received from ED (unit) for routine progression of care      Report consisted of patient???s Situation, Background, Assessment and   Recommendations(SBAR).     Information from the following report(s) SBAR, Kardex, ED Summary, Procedure Summary, Intake/Output, MAR, Recent Results and Med Rec Status was reviewed with the receiving nurse.    Opportunity for questions and clarification was provided.      Assessment completed upon patient???s arrival to unit and care assumed.

## 2016-06-28 NOTE — Other (Signed)
Bedside and Verbal shift change report given to Cleon DewAmanda P RN (oncoming nurse) by Elvina MattesJasmine RN (offgoing nurse). Report included the following information SBAR, Kardex, Procedure Summary, Intake/Output, MAR, Accordion and Recent Results.

## 2016-06-28 NOTE — ED Notes (Signed)
Skin assessment complete. Dual Assessment complete at this time with Lequita AsalJenny Hall, RN.

## 2016-06-28 NOTE — Other (Signed)
Bedside and Verbal shift change report given to Irene Balcorta, RN (oncoming nurse) by Jager Koska N Estepa, RN (offgoing nurse). Report included the following information SBAR, Kardex, ED Summary, Procedure Summary, Intake/Output, MAR, Recent Results and Med Rec Status.

## 2016-06-28 NOTE — Progress Notes (Addendum)
0941  Pt is awake,alert, oriented x 4.  Lungs are clear.  Abdomen soft with hypoactive BS.  Abdominal pain 3/10.  Pt is NPO.   9:55 AM   Pt was seen by  Dr Laurance FlattenHopson. Diet changed to Sips of clears.  Pt ambulated and voided 600 ml of amber urine.  Pt passed out gas this morning.    10:53 AM   Pt medicated withToradol 30 mg IV for pain level 5/10.    1:11 PM   Pt is resting in bed.  Pain level 1/10. Pt ate and tolerated broth and jello for lunch.  Family member at bedside.

## 2016-06-28 NOTE — ED Notes (Signed)
Pt hourly rounding competed.  Safety   Pt () resting on stretcher with side rails up and call bell in reach.    () in chair    () in parents arms.  Toileting   Pt offered ()Bedpan     (xxx)Assistance to Restroom: declined need at this time     ()Urinal  Ongoing Updates  Updated on plan of care and status of test results.  Pain Management  Inquired as to comfort and offered comfort measures:    (xx) warm blankets   () dimmed lights

## 2016-06-28 NOTE — Progress Notes (Addendum)
Chart reviewed.  CM will follow for discharge planning needs.    1540  Met with patient at bedside.  Pt states she lives in [place], but is staying with her mother as she was in town due to her father passing away this past week.  She lives alone in [place], but has adult daughter who can assist.  Denies using any [place] services.  Denies using any DME.  Pts PCP is at [facility] in [facility].  No anticipated needs. CM will cont to follow.    Readmission Risk Assessment: Low Risk and MSSP/Good Help ACO patients    RRAT Score: 1 - 12    Initial Assessment:  Per chart, pt is a 51 y.o. female with PMHx bowel obstruction and PSHx of hysterectomy, tubal ligation, and gastric bypass who presents ambulatory to the emergency department C/O intermittent, worsening, periumbilical and epigastric abdominal pain (rated 10/10), onset a few days. Associated sxs include nausea, diarrhea (yesterday), chills, and decreased urinary frequency. Pt reports that her sxs began with pain. Last BM was 1 day ago, normally has 1 daily. Pt drank water and coffee today. Of note, she reports that her father passed away yesterday. Endorses tobacco use. Notes FHx of non cancerous (mother) and cancerous (maternal grandfather) colon polyps and IBS (mother). Denies any ulcerative colitis or Crohn's. Pt denies vomiting, fever, CP, burning sensation, SOB, leg swelling, illicit drug use, or any other sxs or complaints.     Emergency Contact:   Name Relation Home Work Mobile    ?? Spencer,Helen Friend   [phone]   ??   ?? Emergency Contact Information          Pertinent Medical Hx: bowel obstruction    PCP/Specialists:  MD Hopson    Community Services:     DME:     Low Risk Care Transition Plan:  1. Evaluate for [facility] or [facility], community care coordination of resources  2. Involve patient/caregiver in assessment, planning, education and implement of intervention.  3. CM daily patient care huddles/interdisciplinary rounds.   4. PCP/Specialist appointment within 7 - 10 days made prior to discharge.  5. Facilitate transportation and logistics for follow-up appointments.  6. Handoff to [facility] Nurse Navigator or PCP practice    Care Management Interventions  Transition of Care Consult (CM Consult): Discharge Planning

## 2016-06-28 NOTE — H&P (Signed)
History & Physical    Patient: Lori Delgado MRN: [id number]  CSN: [id number]    Date of Birth: [birth year]  Age: 51 y.o.  Sex: female      DOA: 06/27/2016       HPI:     Lori Delgado is a 51 y.o. female who presents with mid abd pain.  [facility]  S/P gastric bypass and SBO with exp lap.  CT shows incisional hernia and some inflammatory changes near ileum.    Past Medical History:   Diagnosis Date   ??? Bowel obstruction    ??? Chlamydia        Past Surgical History:   Procedure Laterality Date   ??? HX GASTRIC BYPASS     ??? HX HYSTERECTOMY         History reviewed. No pertinent family history.    Social History     Social History   ??? Marital status: LEGALLY SEPARATED     Spouse name: N/A   ??? Number of children: N/A   ??? Years of education: N/A     Social History Main Topics   ??? Smoking status: None   ??? Smokeless tobacco: None   ??? Alcohol use None   ??? Drug use: None   ??? Sexual activity: Not Asked     Other Topics Concern   ??? None     Social History Narrative   ??? None       Prior to Admission medications    Medication Sig Start Date End Date Taking? Authorizing Provider   DULoxetine (CYMBALTA) 60 mg capsule Take 60 mg by mouth two (2) times a day.   Yes Phys Other, MD   QUEtiapine SR (SEROQUEL XR) 150 mg sr tablet Take  by mouth nightly.   Yes Phys Other, MD   multivitamin (ONE A DAY) tablet Take 1 Tab by mouth daily.   Yes Phys Other, MD   calcium-cholecalciferol, d3, (CALCIUM 600 + D) 600-125 mg-unit tab Take  by mouth.   Yes Phys Other, MD   cholecalciferol, vitamin D3, (VITAMIN D3) 2,000 unit tab Take  by mouth.   Yes Phys Other, MD   cyanocobalamin 1,000 mcg tablet Take 1,000 mcg by mouth daily.   Yes Phys Other, MD       Allergies   Allergen Reactions   ??? Amoxil [Amoxicillin] Hives   ??? Bactrim [Sulfamethoprim] Hives   ??? Lamictal [Lamotrigine] Hives       Review of Systems  A comprehensive review of systems was negative except for that written in the History of Present Illness.      Physical Exam:      Visit Vitals    ??? BP 132/64 (BP 1 Location: Right arm, BP Patient Position: At rest)   ??? Pulse 70   ??? Temp 98.2 ??F (36.8 ??C)   ??? Resp 16   ??? Ht 5\' 10"  (1.778 m)   ??? Wt 115 kg (253 lb 8 oz)   ??? SpO2 98%   ??? BMI 36.37 kg/m2       Physical Exam:  Physical Exam:   General:  Alert, cooperative, no distress, appears stated age.   Eyes:  Conjunctivae/corneas clear. PERRL, EOMs intact. Fundi benign   Ears:  Normal TMs and external ear canals both ears.   Nose: Nares normal. Septum midline. Mucosa normal. No drainage or sinus tenderness.   Mouth/Throat: Lips, mucosa, and tongue normal. Teeth and gums normal.   Neck: Supple, symmetrical, trachea midline,  no adenopathy, thyroid: no enlargement/tenderness/nodules, no carotid bruit and no JVD.   Back:   Symmetric, no curvature. ROM normal. No CVA tenderness.   Lungs:   Clear to auscultation bilaterally.   Heart:  Regular rate and rhythm, S1, S2 normal, no murmur, click, rub or gallop.   Abdomen:   Soft, non-tender. Bowel sounds normal. No masses,  No organomegaly.   Extremities: Extremities normal, atraumatic, no cyanosis or edema.   Pulses: 2+ and symmetric all extremities.   Skin: Skin color, texture, turgor normal. No rashes or lesions   Lymph nodes: Cervical, supraclavicular, and axillary nodes normal.   Neurologic: CNII-XII intact. Normal strength, sensation and reflexes throughout.       Labs Reviewed:    All lab results for the last 24 hours reviewed.    Assessment/Plan     Principal Problem:    Incisional hernia with obstruction (06/28/2016)    Active Problems:    Mesenteric vascular insufficiency (HCC) (06/28/2016)      Ventral hernia without obstruction or gangrene (06/28/2016)      Abdominal pain (06/28/2016)      SBO (small bowel obstruction) (06/28/2016)        SBO possibly related to hernia.  Not strangulated.  Improved this AM,  No N/V.  Not too worried about ileum finding, I think it is her hernia that is symptomatic.    Will observe for now.  IVF

## 2016-06-28 NOTE — Other (Signed)
Bedside and Verbal shift change report given to Reita ClicheJ Lightfoot, RN (oncoming nurse) by Cyndie ChimeI Balcorta, RN (offgoing nurse). Report included the following information SBAR, Kardex and MAR.

## 2016-06-28 NOTE — ED Notes (Signed)
Patient medicated per MAR orders.  Verified order, patient identification, and allergies prior to administration.    Call bell within reach of patient. Will continue to monitor and assess.

## 2016-06-29 LAB — CBC WITH AUTOMATED DIFF
ABS. BASOPHILS: 0 10*3/uL (ref 0.0–0.06)
ABS. EOSINOPHILS: 0.2 10*3/uL (ref 0.0–0.4)
ABS. LYMPHOCYTES: 1.1 10*3/uL (ref 0.9–3.6)
ABS. MONOCYTES: 0.2 10*3/uL (ref 0.05–1.2)
ABS. NEUTROPHILS: 2.3 10*3/uL (ref 1.8–8.0)
BASOPHILS: 0 % (ref 0–2)
EOSINOPHILS: 4 % (ref 0–5)
HCT: 33.9 % — ABNORMAL LOW (ref 35.0–45.0)
HGB: 10.9 g/dL — ABNORMAL LOW (ref 12.0–16.0)
LYMPHOCYTES: 29 % (ref 21–52)
MCH: 30.2 PG (ref 24.0–34.0)
MCHC: 32.2 g/dL (ref 31.0–37.0)
MCV: 93.9 FL (ref 74.0–97.0)
MONOCYTES: 6 % (ref 3–10)
MPV: 9.4 FL (ref 9.2–11.8)
NEUTROPHILS: 61 % (ref 40–73)
PLATELET: 265 10*3/uL (ref 135–420)
RBC: 3.61 M/uL — ABNORMAL LOW (ref 4.20–5.30)
RDW: 13 % (ref 11.6–14.5)
WBC: 3.7 10*3/uL — ABNORMAL LOW (ref 4.6–13.2)

## 2016-06-29 LAB — METABOLIC PANEL, BASIC
Anion gap: 7 mmol/L (ref 3.0–18)
BUN/Creatinine ratio: 8 — ABNORMAL LOW (ref 12–20)
BUN: 5 MG/DL — ABNORMAL LOW (ref 7.0–18)
CO2: 26 mmol/L (ref 21–32)
Calcium: 8.8 MG/DL (ref 8.5–10.1)
Chloride: 109 mmol/L — ABNORMAL HIGH (ref 100–108)
Creatinine: 0.6 MG/DL (ref 0.6–1.3)
GFR est AA: >60 mL/min/{1.73_m2} (ref >60)
GFR est non-AA: >60 mL/min/{1.73_m2} (ref >60)
Glucose: 99 mg/dL (ref 74–99)
Potassium: 3.9 mmol/L (ref 3.5–5.5)
Sodium: 142 mmol/L (ref 136–145)

## 2016-06-29 MED ORDER — DIPHENHYDRAMINE HCL 50 MG/ML IJ SOLN
50 mg/mL | Freq: Once | INTRAMUSCULAR | Status: AC
Start: 2016-06-29 — End: 2016-06-29
  Administered 2016-06-29: 14:00:00 via INTRAVENOUS

## 2016-06-29 MED ORDER — DULOXETINE 60 MG CAP, DELAYED RELEASE
60 mg | Freq: Two times a day (BID) | ORAL | Status: DC
Start: 2016-06-29 — End: 2016-06-29
  Administered 2016-06-29: 12:00:00 via ORAL

## 2016-06-29 MED ORDER — QUETIAPINE SR 50 MG 24 HR TAB
50 mg | Freq: Every evening | ORAL | Status: DC
Start: 2016-06-29 — End: 2016-06-29

## 2016-06-29 MED FILL — CYMBALTA 60 MG CAPSULE,DELAYED RELEASE: 60 mg | ORAL | Qty: 1

## 2016-06-29 MED FILL — DIPHENHYDRAMINE HCL 50 MG/ML IJ SOLN: 50 mg/mL | INTRAMUSCULAR | Qty: 1

## 2016-06-29 MED FILL — D5-1/2 NS & POTASSIUM CHLORIDE 20 MEQ/L IV: 20 mEq/L | INTRAVENOUS | Qty: 1000

## 2016-06-29 MED FILL — QUETIAPINE 25 MG TAB: 25 mg | ORAL | Qty: 2

## 2016-06-29 MED FILL — SEROQUEL XR 50 MG TABLET,EXTENDED RELEASE: 50 mg | ORAL | Qty: 3

## 2016-06-29 NOTE — Discharge Summary (Signed)
Discharge Summary by Izora Ribas, MD at 07/06/2016 1028                Author: Izora Ribas, MD  Service: General Surgery  Author Type: Physician       Filed: 2016/07/06 1032  Date of Service: 06-Jul-2016 1028  Status: Signed          Editor: Izora Ribas, MD (Physician)                    Discharge Summary          Patient: Lori Delgado  MRN: [id number]   CSN: [id number]          Date of Birth: Jul 09, 1965   Age: 51 y.o.   Sex: female      DOA: 06/27/2016  LOS:  LOS: 1 day    Discharge Date:         Admission Diagnoses: Abdominal pain   Mesenteric vascular insufficiency (HCC)   Ventral hernia without obstruction or gangrene   SBO (small bowel obstruction)      Discharge Diagnoses:        Problem List as of 2016/07/06   Date Reviewed:  07-06-16                Codes  Class  Noted - Resolved             Rash  ICD-10-CM: R21   ICD-9-CM: 782.1    06-Jul-2016 - Present                       Mesenteric vascular insufficiency (HCC)  ICD-10-CM: K55.1   ICD-9-CM: 557.1    06/28/2016 - Present                       Ventral hernia without obstruction or gangrene  ICD-10-CM: K43.9   ICD-9-CM: 553.20    06/28/2016 - Present                       Abdominal pain  ICD-10-CM: R10.9   ICD-9-CM: 789.00    06/28/2016 - Present                       SBO (small bowel obstruction)  ICD-10-CM: K56.609   ICD-9-CM: 560.9    06/28/2016 - Present                       * (Principal)Incisional hernia with obstruction  ICD-10-CM: K43.0   ICD-9-CM: 552.21    06/28/2016 - Present                          Discharge Condition: Good      Hospital Course: Admitted with abd pain.  CT shows possible SBO, abd wall hernia and possible inflammatory changes near cecum terminal ileum.  I did not think this was her source of problem but  should be followed with repeat CT as outpatient.  Her problem seemed to be related to her hernia which she did not know she had.  She did well and was able to tolerate a diet and her pain resolved.  She did develop a rash  while in the hospital which was  minor.        Consults: None      Significant Diagnostic Studies: see report      Discharge  Medications:        Current Discharge Medication List              CONTINUE these medications which have NOT CHANGED          Details        DULoxetine (CYMBALTA) 60 mg capsule  Take 60 mg by mouth two (2) times a day.               QUEtiapine SR (SEROQUEL XR) 150 mg sr tablet  Take  by mouth nightly.               multivitamin (ONE A DAY) tablet  Take 1 Tab by mouth daily.               calcium-cholecalciferol, d3, (CALCIUM 600 + D) 600-125 mg-unit tab  Take  by mouth.               cholecalciferol, vitamin D3, (VITAMIN D3) 2,000 unit tab  Take  by mouth.               cyanocobalamin 1,000 mcg tablet  Take 1,000 mcg by mouth daily.                         Activity: Activity as tolerated      Diet: Regular Diet      Wound Care: None needed      Follow-up: She will f/u with her PCP and discuss seeing a surgeon in [place] and repeat CT there.

## 2016-06-29 NOTE — Progress Notes (Signed)
Shift summary: Pt A&Ox4, up ad lib, ambulating to the bathroom. Pt denies pain. Denies nausea. Tolerating clear liquids. Passing flatus.

## 2016-06-29 NOTE — Progress Notes (Addendum)
AFVSS  Tolerating diet with no pain now  Hernia reducible.  Developed skin rash     D/C  Rash minor and improved with benedryl

## 2016-06-29 NOTE — Other (Signed)
Pt calls and reports breaking out in rash and itching. Pt denies difficulty breathing, chest tightness, or hoarseness. IV Benadryl given per orders.    0945 Pt reports some relief with itching, but is still uncomfortable. Spoke with Dr. Laurance FlattenHopson, orders given for additional dose Benadryl.

## 2016-06-29 NOTE — Discharge Summary (Signed)
Discharge Summary    Patient: Lori Delgado MRN: [id number]  CSN: [id number]    Date of Birth: Sep 17, 1965  Age: 51 y.o.  Sex: female    DOA: 06/27/2016 LOS:  LOS: 1 day   Discharge Date:      Admission Diagnoses: Abdominal pain  Mesenteric vascular insufficiency (HCC)  Ventral hernia without obstruction or gangrene  SBO (small bowel obstruction)    Discharge Diagnoses:    Problem List as of 06/29/2016  Date Reviewed: 06/29/2016          Codes Class Noted - Resolved    Rash ICD-10-CM: R21  ICD-9-CM: 782.1  06/29/2016 - Present        Mesenteric vascular insufficiency (HCC) ICD-10-CM: K55.1  ICD-9-CM: 557.1  06/28/2016 - Present        Ventral hernia without obstruction or gangrene ICD-10-CM: K43.9  ICD-9-CM: 553.20  06/28/2016 - Present        Abdominal pain ICD-10-CM: R10.9  ICD-9-CM: 789.00  06/28/2016 - Present        SBO (small bowel obstruction) ICD-10-CM: K56.609  ICD-9-CM: 560.9  06/28/2016 - Present        * (Principal)Incisional hernia with obstruction ICD-10-CM: K43.0  ICD-9-CM: 552.21  06/28/2016 - Present              Discharge Condition: Good    Hospital Course: Admitted with abd pain.  CT shows possible SBO, abd wall hernia and possible inflammatory changes near cecum terminal ileum.  I did not think this was her source of problem but should be followed with repeat CT as outpatient.  Her problem seemed to be related to her hernia which she did not know she had.  She did well and was able to tolerate a diet and her pain resolved.  She did develop a rash while in the hospital which was minor.      Consults: None    Significant Diagnostic Studies: see report    Discharge Medications:     Current Discharge Medication List      CONTINUE these medications which have NOT CHANGED    Details   DULoxetine (CYMBALTA) 60 mg capsule Take 60 mg by mouth two (2) times a day.      QUEtiapine SR (SEROQUEL XR) 150 mg sr tablet Take  by mouth nightly.      multivitamin (ONE A DAY) tablet Take 1 Tab by mouth daily.       calcium-cholecalciferol, d3, (CALCIUM 600 + D) 600-125 mg-unit tab Take  by mouth.      cholecalciferol, vitamin D3, (VITAMIN D3) 2,000 unit tab Take  by mouth.      cyanocobalamin 1,000 mcg tablet Take 1,000 mcg by mouth daily.             Activity: Activity as tolerated    Diet: Regular Diet    Wound Care: None needed    Follow-up: She will f/u with her PCP and discuss seeing a surgeon in [place] and repeat CT there.

## 2016-06-29 NOTE — Other (Signed)
Bedside and Verbal shift change report given to J. Waninger, RN (oncoming nurse) by A. Pryor, RN (offgoing nurse). Report included the following information SBAR, Kardex, Intake/Output and MAR.

## 2016-07-03 ENCOUNTER — Other Ambulatory Visit (HOSPITAL_COMMUNITY): Payer: Self-pay

## 2016-07-03 DIAGNOSIS — F332 Major depressive disorder, recurrent severe without psychotic features: Secondary | ICD-10-CM

## 2016-07-03 MED ORDER — DULOXETINE HCL 60 MG PO CPEP: 60.0000 mg | ORAL_CAPSULE | Freq: Two times a day (BID) | ORAL | 0 refills | Status: DC

## 2016-07-07 ENCOUNTER — Ambulatory Visit (HOSPITAL_COMMUNITY): Payer: Self-pay | Admitting: Clinical

## 2016-07-14 ENCOUNTER — Ambulatory Visit (INDEPENDENT_AMBULATORY_CARE_PROVIDER_SITE_OTHER): Payer: Managed Care, Other (non HMO) | Admitting: Clinical

## 2016-07-14 ENCOUNTER — Encounter (HOSPITAL_COMMUNITY): Payer: Self-pay | Admitting: Clinical

## 2016-07-14 DIAGNOSIS — F332 Major depressive disorder, recurrent severe without psychotic features: Secondary | ICD-10-CM | POA: Diagnosis not present

## 2016-07-14 DIAGNOSIS — F4310 Post-traumatic stress disorder, unspecified: Secondary | ICD-10-CM

## 2016-07-14 NOTE — Progress Notes (Signed)
   THERAPIST PROGRESS NOTE  Session Time: 7:04 -8:00  Participation Level: Active  Behavioral Response: CasualAlertDepressed  Type of Therapy: Individual Therapy  Treatment Goals addressed: improve psychiatric symptoms, emotional regulation skills, healthy coping skills, improve unhelpful thought and belief patterns, calming skill,   Interventions: motivational interviewing, CBT,  grounding and Mindfulness techniques  Summary: Brittany [name] is a 51 y.o. female who presents with PTSD and Major Depressive Disorder, recurrent, moderate and ADHD, inattentive.    Suicidal/Homicidal: No without intent/plan  Therapist Response:  Brittany [name] met with clinician for an individual session. She discussed her psychiatric symptoms and her current life events. Brittany [name] shared that she has been experiencing grief and sadness, but at the same time has been doing "better than I would expect." Clinician asked open ended questions. She shared that she has spent the last few weeks going back and forth to [place] to be with her parents and to help her mother as her father was dying and then with his funeral. Clinician asked open ended questions and [name] shared her grief and her process. Sheshared for the most part she was at peace with her fathers death though it is sad that he is no longer here. She shared that there was a thought that she was ruminating on that was making her angry and depressed. Clinician asked open ended questions and Brittany [name] identified the thought, the evidence for and against the thought. She was then able to formulate healthier alternative thoughts. She shared that this was helpful. Client and clinician reviewed grounding and mindfulness techniques.   Plan: Return in 1-2 weeks.  Diagnosis: Axis I:  Major Depressive Disorder, recurrent, severe without psychotic features and PTSD  Finneus Kaneshiro A, LCSW 07/14/2016

## 2016-07-21 ENCOUNTER — Ambulatory Visit (INDEPENDENT_AMBULATORY_CARE_PROVIDER_SITE_OTHER): Payer: 59 | Admitting: Family

## 2016-07-21 ENCOUNTER — Encounter: Payer: Self-pay | Admitting: Family

## 2016-07-21 VITALS — BP 116/80 | HR 64 | Temp 97.8°F | Ht 70.0 in | Wt 253.0 lb

## 2016-07-21 DIAGNOSIS — K430 Incisional hernia with obstruction, without gangrene: Secondary | ICD-10-CM

## 2016-07-21 NOTE — Patient Instructions (Addendum)
Thank you for choosing [facility].  SUMMARY AND INSTRUCTIONS:  Avoid constipation.   Advance diet as tolerated.  They will call to schedule your appointment with general surgery.  Ibuprofen (400-800 mg) and Tylenol 500 mg up to 3 times per day for the next 2-3 days.  Medication:  Your prescription(s) have been submitted to your pharmacy or been printed and provided for you. Please take as directed and contact our office if you believe you are having problem(s) with the medication(s) or have any questions.  Follow up:  If your symptoms worsen or fail to improve, please contact our office for further instruction, or in case of emergency go directly to the emergency room at the closest medical facility.    Hernia A hernia happens when an organ or tissue inside your body pushes out through a weak spot in the belly (abdomen). Follow these instructions at home:  Avoid stretching or overusing (straining) the muscles near the hernia.  Do not lift anything heavier than 10 lb (4.5 kg).  Use the muscles in your leg when you lift something up. Do not use the muscles in your back.  When you cough, try to cough gently.  Eat a diet that has a lot of fiber. Eat lots of fruits and vegetables.  Drink enough fluids to keep your pee (urine) clear or pale yellow. Try to drink 6-8 glasses of water a day.  Take medicines to make your poop soft (stool softeners) as told by your doctor.  Lose weight, if you are overweight.  Do not use any tobacco products, including cigarettes, chewing tobacco, or electronic cigarettes. If you need help quitting, ask your doctor.  Keep all follow-up visits as told by your doctor. This is important. Contact a doctor if:  The skin by the hernia gets puffy (swollen) or red.  The hernia is painful. Get help right away if:  You have a fever.  You have belly pain that is getting worse.  You feel sick to your stomach (nauseous) or you throw up  (vomit).  You cannot push the hernia back in place by gently pressing on it while you are lying down.  The hernia:  Changes in shape or size.  Is stuck outside your belly.  Changes color.  Feels hard or tender. This information is not intended to replace advice given to you by your health care provider. Make sure you discuss any questions you have with your health care provider. Document Released: 09/18/2009 Document Revised: 09/06/2015 Document Reviewed: 02/08/2014 Elsevier Interactive Patient Education  2017 [employer].

## 2016-07-21 NOTE — Progress Notes (Signed)
Pre visit review using our clinic review tool, if applicable. No additional management support is needed unless otherwise documented below in the visit note. 

## 2016-07-21 NOTE — Progress Notes (Signed)
Subjective:    Patient ID: Brittany [name], female    DOB: April 19, 1965, 51 y.o.   MRN: [id number]  Chief Complaint  Patient presents with  . Hospitalization Follow-up    HPI:  Brittany [name] is a 51 y.o. female who  has a past medical history of Allergy; Anxiety; Arthritis; Depression; GERD (gastroesophageal reflux disease); H/O hiatal hernia; History of chicken pox; Hypercholesterolemia; Hypertension; Migraine; Migraines; Morbid obesity ([place]); Positive TB test; PTSD (post-traumatic stress disorder); and Sleep apnea. and presents today for an a follow up office visit.  This is a new problem. Recently evaluated at an outside facility and diagnosed with an incisional hernia with obstruction at previous surigical site. Describes that she was moving the bed in attempt to help her father which she estimates may weigh a couple hundred pounds instantaneously resulting in abdominal pain; At the hospital she was rested and symptoms gradually improved as well as the blockage without surgical intervention. She continues to have pain located in the upper left quadrant. Bowel movements have returned to baseline. She is eating okay with smaller meals. No nausea, vomiting, diarrhea, or constipation. Denies blood in her stool. Symptoms are slowly worsening. Pain is currently managed with Tylenol. Symptoms are improved by lying down.   Allergies  Allergen Reactions  . Amoxicillin Hives    .Marland KitchenHas patient had a PCN reaction causing immediate rash, facial/tongue/throat swelling, SOB or lightheadedness with hypotension: Yes Has patient had a PCN reaction causing severe rash involving mucus membranes or skin necrosis: No Has patient had a PCN reaction that required hospitalization No Has patient had a PCN reaction occurring within the last 10 years: No If all of the above answers are "NO", then may proceed with Cephalosporin use.   . Bactrim [Sulfamethoxazole-Trimethoprim] Hives and Rash  . Lamictal [Lamotrigine]  Hives and Rash      Outpatient Medications Prior to Visit  Medication Sig Dispense Refill  . calcium-vitamin D (OSCAL WITH D) 500-200 MG-UNIT tablet Take 3 tablets by mouth every morning. For bone health    . cholecalciferol (VITAMIN D) 1000 units tablet Take 1 tablet (1,000 Units total) by mouth daily. For bone health    . clotrimazole-betamethasone (LOTRISONE) cream Apply 1 application topically 2 (two) times daily. 30 g 0  . DULoxetine (CYMBALTA) 60 MG capsule Take 1 capsule (60 mg total) by mouth 2 (two) times daily. 180 capsule 0  . hydrOXYzine (VISTARIL) 25 MG capsule Take 1 capsule (25 mg total) by mouth daily. 90 capsule 0  . loratadine (CLARITIN) 10 MG tablet Take 1 tablet (10 mg total) by mouth daily. For allergies    . Multiple Vitamins-Minerals (MULTIVITAMIN WITH MINERALS) tablet Take 1 tablet by mouth daily. For low vitamin    . QUEtiapine (SEROQUEL) 50 MG tablet TAKE 3 TABLETS BY MOUTH EVERY NIGHT AT BEDTIME 90 tablet 0  . vitamin B-12 (CYANOCOBALAMIN) 500 MCG tablet Take 1 tablet (500 mcg total) by mouth daily. For Vitamin B-12 replacement     Facility-Administered Medications Prior to Visit  Medication Dose Route Frequency Provider Last Rate Last Dose  . 0.9 %  sodium chloride infusion  500 mL Intravenous Continuous Ladene Artist, MD          Past Surgical History:  Procedure Laterality Date  . ABDOMINAL HYSTERECTOMY  1997 & 2006  . BREATH TEK H PYLORI  05/03/2012   Procedure: BREATH TEK H PYLORI;  Surgeon: [name], MD;  Location: [name];  Service: General;  Laterality:  N/A;  . COLONOSCOPY     hx of benign polyps   . FOOT SURGERY    . GASTRIC BYPASS    . GASTRIC ROUX-EN-Y N/A 07/05/2012   Procedure: LAPAROSCOPIC ROUX-EN-Y GASTRIC;  Surgeon: [name], MD;  Location: [facility];  Service: General;  Laterality: N/A;  . IRRIGATION AND DEBRIDEMENT ABSCESS N/A 01/01/2014   Procedure: IRRIGATION AND DEBRIDEMENT ABSCESS;  Surgeon: [name], MD;   Location: [facility];  Service: General;  Laterality: N/A;  . LAPAROSCOPIC LYSIS OF ADHESIONS N/A 07/05/2012   Procedure: LAPAROSCOPIC LYSIS OF ADHESIONS;  Surgeon: [name], MD;  Location: [facility];  Service: General;  Laterality: N/A;  repair of abdominal wall hernia  . right tube and ovary removed     . UPPER GI ENDOSCOPY N/A 07/05/2012   Procedure: UPPER GI ENDOSCOPY;  Surgeon: [name], MD;  Location: [facility];  Service: General;  Laterality: N/A;      Past Medical History:  Diagnosis Date  . Allergy   . Anxiety   . Arthritis    hips, knees and hands   . Depression   . GERD (gastroesophageal reflux disease)    hx of   . H/O hiatal hernia   . History of chicken pox   . Hypercholesterolemia   . Hypertension   . Migraine   . Migraines   . Morbid obesity ([place])   . Positive TB test   . PTSD (post-traumatic stress disorder)   . Sleep apnea    no CPAP machine     Review of Systems  Constitutional: Negative for chills and fever.  Respiratory: Negative for chest tightness and shortness of breath.   Cardiovascular: Negative for chest pain and leg swelling.  Gastrointestinal: Positive for abdominal pain and nausea. Negative for blood in stool, constipation, diarrhea, rectal pain and vomiting.      Objective:    BP 116/80   Pulse 64   Temp 97.8 F (36.6 C) (Oral)   Ht 5\' 10"  (1.778 m)   Wt 253 lb (114.8 kg)   SpO2 100%   BMI 36.30 kg/m  Nursing note and vital signs reviewed.  Physical Exam  Constitutional: She is oriented to person, place, and time. She appears well-developed and well-nourished. No distress.  Cardiovascular: Normal rate, regular rhythm, normal heart sounds and intact distal pulses.   Pulmonary/Chest: Effort normal and breath sounds normal.  Abdominal: Normal appearance and bowel sounds are normal. There is no hepatosplenomegaly. There is tenderness in the periumbilical area and left upper quadrant. There is no rigidity, no rebound, no guarding, no  tenderness at McBurney's point and negative Murphy's sign. A hernia is present.  Neurological: She is alert and oriented to person, place, and time.  Skin: Skin is warm and dry.  Psychiatric: She has a normal mood and affect. Her behavior is normal. Judgment and thought content normal.       Assessment & Plan:   Problem List Items Addressed This Visit      Digestive   Incisional hernia with obstruction but no gangrene - Primary    Symptoms remain consistent with incisional hernia with no evidence of obstruction or gangrene currently. Start Duexis and Tylenol. Referral placed to general surgery. Advised to seek emergency care if symptoms worsen or do not improve pending referral to general surgery.       Relevant Orders   Ambulatory referral to General Surgery       I am having Ms. Duffin maintain her  calcium-vitamin D, cholecalciferol, loratadine, multivitamin with minerals, vitamin B-12, hydrOXYzine, clotrimazole-betamethasone, QUEtiapine, and DULoxetine. We will continue to administer sodium chloride.   Follow-up: Return if symptoms worsen or fail to improve.  Mauricio Po, FNP

## 2016-07-21 NOTE — Assessment & Plan Note (Signed)
Symptoms remain consistent with incisional hernia with no evidence of obstruction or gangrene currently. Start Duexis and Tylenol. Referral placed to general surgery. Advised to seek emergency care if symptoms worsen or do not improve pending referral to general surgery.

## 2016-07-22 ENCOUNTER — Telehealth: Payer: Self-pay | Admitting: Family

## 2016-07-22 NOTE — Telephone Encounter (Signed)
Pt states the sample given to her Duexis, it did not upset her stomach and she would like to have a rx put in.

## 2016-07-23 MED ORDER — IBUPROFEN-FAMOTIDINE 800-26.6 MG PO TABS: ORAL_TABLET | ORAL | 0 refills | Status: DC

## 2016-07-23 NOTE — Telephone Encounter (Signed)
Rx sent to [facility]. Pt aware

## 2016-07-31 ENCOUNTER — Encounter (HOSPITAL_COMMUNITY): Payer: Self-pay | Admitting: Psychiatry

## 2016-07-31 ENCOUNTER — Ambulatory Visit (INDEPENDENT_AMBULATORY_CARE_PROVIDER_SITE_OTHER): Payer: 59 | Admitting: Psychiatry

## 2016-07-31 VITALS — BP 112/78 | HR 72 | Ht 70.0 in | Wt 252.0 lb

## 2016-07-31 DIAGNOSIS — Z79899 Other long term (current) drug therapy: Secondary | ICD-10-CM

## 2016-07-31 DIAGNOSIS — Z87891 Personal history of nicotine dependence: Secondary | ICD-10-CM

## 2016-07-31 DIAGNOSIS — F332 Major depressive disorder, recurrent severe without psychotic features: Secondary | ICD-10-CM | POA: Diagnosis not present

## 2016-07-31 DIAGNOSIS — Z818 Family history of other mental and behavioral disorders: Secondary | ICD-10-CM

## 2016-07-31 DIAGNOSIS — Z811 Family history of alcohol abuse and dependence: Secondary | ICD-10-CM

## 2016-07-31 MED ORDER — DULOXETINE HCL 60 MG PO CPEP: 60.0000 mg | ORAL_CAPSULE | Freq: Two times a day (BID) | ORAL | 0 refills | Status: DC

## 2016-07-31 MED ORDER — QUETIAPINE FUMARATE 50 MG PO TABS: 150.0000 mg | ORAL_TABLET | Freq: Every day | ORAL | 2 refills | Status: DC

## 2016-07-31 MED ORDER — HYDROXYZINE PAMOATE 25 MG PO CAPS: 25.0000 mg | ORAL_CAPSULE | Freq: Every day | ORAL | 0 refills | Status: DC
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
## 2016-08-01 ENCOUNTER — Other Ambulatory Visit (HOSPITAL_COMMUNITY): Payer: Self-pay

## 2016-08-01 DIAGNOSIS — F332 Major depressive disorder, recurrent severe without psychotic features: Secondary | ICD-10-CM

## 2016-08-01 MED ORDER — QUETIAPINE FUMARATE 50 MG PO TABS
150.0000 mg | ORAL_TABLET | Freq: Every day | ORAL | 2 refills | Status: DC
Start: 2016-08-01 — End: 2016-11-04

## 2016-08-01 MED ORDER — DULOXETINE HCL 60 MG PO CPEP: 60.0000 mg | ORAL_CAPSULE | Freq: Two times a day (BID) | ORAL | 0 refills | Status: DC

## 2016-08-01 MED ORDER — HYDROXYZINE PAMOATE 25 MG PO CAPS: 25.0000 mg | ORAL_CAPSULE | Freq: Every day | ORAL | 0 refills | Status: DC

## 2016-08-04 ENCOUNTER — Ambulatory Visit (INDEPENDENT_AMBULATORY_CARE_PROVIDER_SITE_OTHER): Payer: 59 | Admitting: Clinical

## 2016-08-04 ENCOUNTER — Encounter (HOSPITAL_COMMUNITY): Payer: Self-pay | Admitting: Clinical

## 2016-08-04 DIAGNOSIS — F332 Major depressive disorder, recurrent severe without psychotic features: Secondary | ICD-10-CM

## 2016-08-04 DIAGNOSIS — F4310 Post-traumatic stress disorder, unspecified: Secondary | ICD-10-CM | POA: Diagnosis not present

## 2016-08-04 NOTE — Progress Notes (Signed)
   THERAPIST PROGRESS NOTE  Session Time: 7:05 -8:00  Participation Level: Active  Behavioral Response: CasualAlertDepressed  Type of Therapy: Individual Therapy  Treatment Goals addressed: improve psychiatric symptoms, emotional regulation skills, healthy coping skills, improve unhelpful thought and belief patterns, calming skill,   Interventions: motivational interviewing, CBT, CPT, grounding techniques  Summary: Brittany [name] is a 51 y.o. female who presents with PTSD and Major Depressive Disorder, recurrent, moderate and ADHD, inattentive.    Suicidal/Homicidal: No without intent/plan  Therapist Response:  Brittany [name] met with clinician for an individual session. She discussed her psychiatric symptoms and  her current life events. She shared that she has been having ups and downs, some depression and some grief. She shared that she has not had any suicidal thoughts recently. Clinician asked open ended questions and Brittany [name] shared about the coping skills she has been using. She shared about some of the ruminating thoughts she has been experiencing. Client and clinician discussed the thoughts and the triggers for the thoughts. Clinician asked open ended questions and Brittany [name] identified the evidence for and against the thoughts. She was then able to formulate healthier alternative thoughts. Client and clinician discussed the process and her insights. Client and clinician also discussed relationships, grief and healthy coping skills. Clinician taught Brittany [name] a new grounding technique.  Client and clinician practiced the technique together.     Plan: Return in 1-2 weeks.  Diagnosis: Axis I:  Major Depressive Disorder, recurrent, severe without psychotic features and PTSD    Ekansh Sherk A, LCSW 08/04/2016

## 2016-08-27 ENCOUNTER — Encounter: Payer: Self-pay | Admitting: Gynecology

## 2016-08-29 ENCOUNTER — Other Ambulatory Visit: Payer: Self-pay | Admitting: General Surgery

## 2016-08-29 DIAGNOSIS — R109 Unspecified abdominal pain: Secondary | ICD-10-CM

## 2016-09-01 ENCOUNTER — Ambulatory Visit (INDEPENDENT_AMBULATORY_CARE_PROVIDER_SITE_OTHER): Payer: 59 | Admitting: Clinical

## 2016-09-01 ENCOUNTER — Encounter (HOSPITAL_COMMUNITY): Payer: Self-pay | Admitting: Clinical

## 2016-09-01 DIAGNOSIS — F332 Major depressive disorder, recurrent severe without psychotic features: Secondary | ICD-10-CM | POA: Diagnosis not present

## 2016-09-01 DIAGNOSIS — F4310 Post-traumatic stress disorder, unspecified: Secondary | ICD-10-CM

## 2016-09-01 NOTE — Progress Notes (Signed)
   THERAPIST PROGRESS NOTE  Session Time: 7:03 - 8:00   Participation Level: Active  Behavioral Response: NeatAlertDepressed  Type of Therapy: Individual Therapy  Treatment Goals addressed: improve psychiatric symptoms, emotional regulation skills, healthy coping skills, improve unhelpful thought and belief patterns,   Interventions: motivational interviewing, CBT,  Summary: Brittany [name] is [name] 51 y.o. female who presents with PTSD and Major Depressive Disorder, recurrent, moderate and ADHD, inattentive.    Suicidal/Homicidal: No without intent/plan  Therapist Response:  Brittany [name] met with clinician for an individual session. She discussed her psychiatric symptoms, her current life events, and her homework. She shared she was feeling depressed recently. Clinician asked open ended questions and Brittany [name] shared that she has been isolating and not feeling motivated to do much. She shared that her sister in law passed away and the lack of interaction with those who were her family (prior to divorce) made her have [name] lot of negative automatic thoughts. She shared she had been trying to use her skills but felt overwhelmed. She also shared that when she attempted to do some self care she had [name] panic attack. Client and clinician used [name] 7 panel thought record sheet to evaluate Brittany [name]'s negative automatic thoughts. Clinician asked open ended questions and Brittany [name] identified her thoughts and emotions, she then identified the evidence for and against the thoughts. She then formulated healthier alternative thoughts. Clinician noticed that she did not believe her healthier alternative thoughts. Clinician encouraged her to formulate thoughts that were just improvements on her negative thoughts rather than thoughts she wanted to believe but didn't. Brittany [name] did so and then shared that this helpped. Client and clinician discussed the process. Clinician encouraged her to participate in  Healthy activities even when she did not feel  like it as it might help improve her mood.  Plan: Return in 1-2 weeks.  Diagnosis: Axis I:  Major Depressive Disorder, recurrent, severe without psychotic features and PTSD   Brittany Waln A, LCSW 09/01/2016

## 2016-09-05 ENCOUNTER — Ambulatory Visit
Admission: RE | Admit: 2016-09-05 | Discharge: 2016-09-05 | Disposition: A | Discharge status: Home / Self Care | Payer: 59 | Source: Ambulatory Visit | Attending: General Surgery | Admitting: General Surgery

## 2016-09-05 DIAGNOSIS — R109 Unspecified abdominal pain: Secondary | ICD-10-CM

## 2016-09-05 MED ORDER — IOPAMIDOL (ISOVUE-300) INJECTION 61%
100.0000 mL | Freq: Once | INTRAVENOUS | Status: AC | PRN
  Administered 2016-09-05: 100 mL via INTRAVENOUS

## 2016-09-29 ENCOUNTER — Other Ambulatory Visit (HOSPITAL_COMMUNITY): Payer: Self-pay | Admitting: Psychiatry

## 2016-09-29 DIAGNOSIS — F332 Major depressive disorder, recurrent severe without psychotic features: Secondary | ICD-10-CM

## 2016-10-02 ENCOUNTER — Encounter (HOSPITAL_COMMUNITY): Payer: Self-pay | Admitting: Clinical

## 2016-10-02 ENCOUNTER — Ambulatory Visit (INDEPENDENT_AMBULATORY_CARE_PROVIDER_SITE_OTHER): Payer: 59 | Admitting: Clinical

## 2016-10-02 DIAGNOSIS — F332 Major depressive disorder, recurrent severe without psychotic features: Secondary | ICD-10-CM | POA: Diagnosis not present

## 2016-10-02 DIAGNOSIS — F4310 Post-traumatic stress disorder, unspecified: Secondary | ICD-10-CM
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
## 2016-10-20 ENCOUNTER — Encounter (HOSPITAL_COMMUNITY): Payer: Self-pay | Admitting: Clinical

## 2016-10-20 ENCOUNTER — Ambulatory Visit (INDEPENDENT_AMBULATORY_CARE_PROVIDER_SITE_OTHER): Payer: 59 | Admitting: Clinical

## 2016-10-20 DIAGNOSIS — F4310 Post-traumatic stress disorder, unspecified: Secondary | ICD-10-CM | POA: Diagnosis not present

## 2016-10-20 DIAGNOSIS — F332 Major depressive disorder, recurrent severe without psychotic features: Secondary | ICD-10-CM

## 2016-10-20 NOTE — Progress Notes (Signed)
   THERAPIST PROGRESS NOTE  Session Time: 7:13 -8: 00  Participation Level: Active  Behavioral Response: NeatAlertDepressed  Type of Therapy: Individual Therapy  Treatment Goals addressed: improve psychiatric symptoms, emotional regulation skills, healthy coping skills, improve unhelpful thought and belief patterns,   Interventions: motivational interviewing, CBT,grounding and Mindfulness techniques  Summary: Brittany [name] is a 51 y.o. female who presents with PTSD and Major Depressive Disorder, recurrent, moderate and ADHD, inattentive.    Suicidal/Homicidal: No without intent/plan  Therapist Response:  [name] met with clinician for an individual session. She discussed her psychiatric symptoms and  her current life events. [name] shared that she had a difficult week last week. She shared she was served with divorce papers, several of her coworkers were laid off , and a family friend ([id number]) is in the process of dying. She shared that she battled falling into deep depression. Clinician asked open ended questions and [name] shared her thoughts and emotions. She shared about the skills she had been using. She shared some of the areas she struggled. She shared some of the negative thoughts she has kept experiencing. Client and clinician discussed her negative thoughts. Clinician asked open ended questions. [name] identified the evidence for and against the negative automatic thoughts. She was then able to formulate healthier alternative thoughts. Client and clinician reviewed and discussed grounding and mindfulness techniques. Client and clinician discussed [name]'s progress in managing her emotions. Client and clinician reviewed techniques practiced. Client and clinician exchanged farewells  Plan: Return in 1-2 weeks.  Diagnosis: Axis I:  Major Depressive Disorder, recurrent, severe without psychotic features and PTSD    Cristo Ausburn A, LCSW 10/20/2016

## 2016-11-04 ENCOUNTER — Encounter (HOSPITAL_COMMUNITY): Payer: Self-pay | Admitting: Psychiatry

## 2016-11-04 ENCOUNTER — Ambulatory Visit (INDEPENDENT_AMBULATORY_CARE_PROVIDER_SITE_OTHER): Payer: 59 | Admitting: Psychiatry

## 2016-11-04 DIAGNOSIS — F332 Major depressive disorder, recurrent severe without psychotic features: Secondary | ICD-10-CM

## 2016-11-04 DIAGNOSIS — Z811 Family history of alcohol abuse and dependence: Secondary | ICD-10-CM | POA: Diagnosis not present

## 2016-11-04 DIAGNOSIS — Z818 Family history of other mental and behavioral disorders: Secondary | ICD-10-CM

## 2016-11-04 DIAGNOSIS — Z87891 Personal history of nicotine dependence: Secondary | ICD-10-CM

## 2016-11-04 MED ORDER — DULOXETINE HCL 60 MG PO CPEP: 60.0000 mg | ORAL_CAPSULE | Freq: Two times a day (BID) | ORAL | 0 refills | Status: DC

## 2016-11-04 MED ORDER — QUETIAPINE FUMARATE 50 MG PO TABS: 150.0000 mg | ORAL_TABLET | Freq: Every day | ORAL | 2 refills | Status: DC

## 2016-11-04 MED ORDER — HYDROXYZINE PAMOATE 25 MG PO CAPS: 25.0000 mg | ORAL_CAPSULE | Freq: Every day | ORAL | 0 refills | Status: DC | PRN
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
## 2016-11-17 ENCOUNTER — Ambulatory Visit: Payer: Self-pay | Admitting: Family

## 2016-11-18 ENCOUNTER — Ambulatory Visit (HOSPITAL_COMMUNITY): Payer: Self-pay | Admitting: Psychology

## 2016-11-18 ENCOUNTER — Ambulatory Visit (INDEPENDENT_AMBULATORY_CARE_PROVIDER_SITE_OTHER): Payer: 59 | Admitting: Psychology

## 2016-11-18 DIAGNOSIS — F331 Major depressive disorder, recurrent, moderate: Secondary | ICD-10-CM | POA: Diagnosis not present

## 2016-11-18 NOTE — Progress Notes (Signed)
   THERAPIST PROGRESS NOTE  Session Time: 10.55am-11.50am  Participation Level: Active  Behavioral Response: Well GroomedAlerttearful at times  Type of Therapy: Individual Therapy  Treatment Goals addressed: Diagnosis: MDd and goal 1.  Interventions: CBT and Supportive  Summary: Brittany [name] is a 51 y.o. female who presents with affect congruent w/ mood.  Pt was tearful at time when discussing challenges in past year- but also able to reflect and discuss how she has been coping and improved w/ her mood.  Pt was able to share her story of past year- father's passing in March 2018, husband separating and divorce papers just served last month and how he has been unwilling to communicate.  Pt reports her tx and use of 7 panel sheets to assist in catching "negative thought patterns" and reframing.  Pt also discussed use of massage and mindfulness for coping.  Pt discussed how she has had her house as project- but now complete and so aware of finding next area for focus for herself.  Pt is getting out of house- supports are her neighbor and daughter that is local.  Pt is working towards acceptance of relationships that have falling off and not continuing to blame self- accept responsibility for her actions in them and how she is attempted to resolve. Pt considering volunteer work and acknowledging doesn't have to do 100% to start.  Suicidal/Homicidal: Nowithout intent/plan  Therapist Response: Assessed pt current functioning per pt report.  Focused on rapport building as her new therapist and engaging w/ what has assisted pt in her growth.  Discussed next steps for pt and encouraging taking next steps to exploring ways of further engaging w/ interests or community.   Plan: Return again in 2 weeks.  Diagnosis: MDD, moderate    Helder Crisafulli, LPC 11/18/2016

## 2016-11-19 ENCOUNTER — Encounter: Payer: Self-pay | Admitting: Family

## 2016-11-19 ENCOUNTER — Other Ambulatory Visit (INDEPENDENT_AMBULATORY_CARE_PROVIDER_SITE_OTHER): Payer: 59

## 2016-11-19 ENCOUNTER — Ambulatory Visit (INDEPENDENT_AMBULATORY_CARE_PROVIDER_SITE_OTHER): Payer: 59 | Admitting: Family

## 2016-11-19 DIAGNOSIS — R1013 Epigastric pain: Secondary | ICD-10-CM | POA: Diagnosis not present

## 2016-11-19 LAB — HEPATIC FUNCTION PANEL
ALT: 14 U/L (ref 0–35)
AST: 10 U/L (ref 0–37)
Albumin: 4 g/dL (ref 3.5–5.2)
Alkaline Phosphatase: 77 U/L (ref 39–117)
BILIRUBIN TOTAL: 0.3 mg/dL (ref 0.2–1.2)
Bilirubin, Direct: 0.1 mg/dL (ref 0.0–0.3)
TOTAL PROTEIN: 7.2 g/dL (ref 6.0–8.3)

## 2016-11-19 LAB — AMYLASE: Amylase: 31 U/L (ref 27–131)

## 2016-11-19 LAB — LIPASE: Lipase: 24 U/L (ref 11.0–59.0)

## 2016-11-19 NOTE — Progress Notes (Signed)
Subjective:    Patient ID: Brittany [name], female    DOB: 04/27/1965, 51 y.o.   MRN: [id number]  Chief Complaint  Patient presents with  . Abdominal Pain    still having same issues from a few months ago when she was seen, states she gets really bad stomach pain within an hour of eating    HPI:  Brittany [name] is a 51 y.o. female who  has a past medical history of Allergy; Anxiety; Arthritis; Depression; GERD (gastroesophageal reflux disease); H/O hiatal hernia; History of chicken pox; Hypercholesterolemia; Hypertension; Migraine; Migraines; Morbid obesity ([place]); Positive TB test; PTSD (post-traumatic stress disorder); and Sleep apnea. and presents today for a follow up office visit.  Continues to experience pain located in her upper abdomen that has been going on for several months and CT scan showed an incisional hernia. Pains wax and wane at times and are aggravated by eating. Modifying factors include lying on her side and hoping it goes away. She can eat on occasion without having pain. When she eats high fat she tends to get more sick. Severity of the pain is "drop you to your knees" at times.   Allergies  Allergen Reactions  . Amoxicillin Hives    .Marland KitchenHas patient had a PCN reaction causing immediate rash, facial/tongue/throat swelling, SOB or lightheadedness with hypotension: Yes Has patient had a PCN reaction causing severe rash involving mucus membranes or skin necrosis: No Has patient had a PCN reaction that required hospitalization No Has patient had a PCN reaction occurring within the last 10 years: No If all of the above answers are "NO", then may proceed with Cephalosporin use.   . Bactrim [Sulfamethoxazole-Trimethoprim] Hives and Rash  . Lamictal [Lamotrigine] Hives and Rash      Outpatient Medications Prior to Visit  Medication Sig Dispense Refill  . calcium-vitamin D (OSCAL WITH D) 500-200 MG-UNIT tablet Take 3 tablets by mouth every morning. For bone health    .  cholecalciferol (VITAMIN D) 1000 units tablet Take 1 tablet (1,000 Units total) by mouth daily. For bone health    . clotrimazole-betamethasone (LOTRISONE) cream Apply 1 application topically 2 (two) times daily. 30 g 0  . DULoxetine (CYMBALTA) 60 MG capsule Take 1 capsule (60 mg total) by mouth 2 (two) times daily. 180 capsule 0  . hydrOXYzine (VISTARIL) 25 MG capsule Take 1 capsule (25 mg total) by mouth daily as needed for anxiety. 30 capsule 0  . Ibuprofen-Famotidine 800-26.6 MG TABS Take 1 tablet (800 mg) every 8 hours as needed for pain. 90 tablet 0  . loratadine (CLARITIN) 10 MG tablet Take 1 tablet (10 mg total) by mouth daily. For allergies    . Multiple Vitamins-Minerals (MULTIVITAMIN WITH MINERALS) tablet Take 1 tablet by mouth daily. For low vitamin    . QUEtiapine (SEROQUEL) 50 MG tablet Take 3 tablets (150 mg total) by mouth at bedtime. 90 tablet 2  . vitamin B-12 (CYANOCOBALAMIN) 500 MCG tablet Take 1 tablet (500 mcg total) by mouth daily. For Vitamin B-12 replacement     Facility-Administered Medications Prior to Visit  Medication Dose Route Frequency Provider Last Rate Last Dose  . 0.9 %  sodium chloride infusion  500 mL Intravenous Continuous Ladene Artist, MD          Past Surgical History:  Procedure Laterality Date  . ABDOMINAL HYSTERECTOMY  1997 & 2006  . BREATH TEK H PYLORI  05/03/2012   Procedure: BREATH TEK H PYLORI;  Surgeon: Shanon Brow  Bary Leriche, MD;  Location: [name];  Service: General;  Laterality: N/A;  . COLONOSCOPY     hx of benign polyps   . FOOT SURGERY    . GASTRIC BYPASS    . GASTRIC ROUX-EN-Y N/A 07/05/2012   Procedure: LAPAROSCOPIC ROUX-EN-Y GASTRIC;  Surgeon: [name], MD;  [facility];  Service: General;  Laterality: N/A;  . IRRIGATION AND DEBRIDEMENT ABSCESS N/A 01/01/2014   Procedure: IRRIGATION AND DEBRIDEMENT ABSCESS;  Surgeon: [name], MD;  Location: [facility];  Service: General;  Laterality: N/A;  . LAPAROSCOPIC LYSIS OF  ADHESIONS N/A 07/05/2012   Procedure: LAPAROSCOPIC LYSIS OF ADHESIONS;  Surgeon: [name], MD;  Location: [facility];  Service: General;  Laterality: N/A;  repair of abdominal wall hernia  . right tube and ovary removed     . UPPER GI ENDOSCOPY N/A 07/05/2012   Procedure: UPPER GI ENDOSCOPY;  Surgeon: [name], MD;  Location: [facility];  Service: General;  Laterality: N/A;      Past Medical History:  Diagnosis Date  . Allergy   . Anxiety   . Arthritis    hips, knees and hands   . Depression   . GERD (gastroesophageal reflux disease)    hx of   . H/O hiatal hernia   . History of chicken pox   . Hypercholesterolemia   . Hypertension   . Migraine   . Migraines   . Morbid obesity ([place])   . Positive TB test   . PTSD (post-traumatic stress disorder)   . Sleep apnea    no CPAP machine       Review of Systems  Constitutional: Negative for activity change, appetite change, chills, fatigue and fever.  Respiratory: Negative for chest tightness and shortness of breath.   Cardiovascular: Negative for chest pain, palpitations and leg swelling.  Gastrointestinal: Positive for abdominal pain and constipation. Negative for anal bleeding, blood in stool, diarrhea, nausea, rectal pain and vomiting.      Objective:    BP 124/88 (BP Location: Left Arm, Patient Position: Sitting, Cuff Size: Large)   Pulse 75   Temp 98 F (36.7 C) (Oral)   Resp 16   Ht 5\' 10"  (1.778 m)   Wt 261 lb (118.4 kg)   SpO2 95%   BMI 37.45 kg/m  Nursing note and vital signs reviewed.  Physical Exam  Constitutional: She is oriented to person, place, and time. She appears well-developed and well-nourished. No distress.  Cardiovascular: Normal rate, regular rhythm, normal heart sounds and intact distal pulses.   Pulmonary/Chest: Effort normal and breath sounds normal.  Abdominal: Normal appearance. She exhibits no distension and no mass. There is no hepatosplenomegaly. There is tenderness in the epigastric  area and left lower quadrant. There is no rigidity, no rebound, no guarding, no tenderness at McBurney's point and negative Murphy's sign.  Neurological: She is alert and oriented to person, place, and time.  Skin: Skin is warm and dry.  Psychiatric: She has a normal mood and affect. Her behavior is normal. Judgment and thought content normal.       Assessment & Plan:   Problem List Items Addressed This Visit      Other   Epigastric abdominal pain    Epigastric abdominal pain the setting of incisional hernia and status post gastric bypass surgery. CT scan with sludge noted within the gall bladder with non-calcified stones. Obtain lipase, hepatic function and amylase. Treat conservatively with reducing meal quantity. Recommend follow  up evaluation with general surgery to assess for complication of gastric bypass. Follow up if symptoms worsen and consider additional imaging as indicated.       Relevant Orders   Lipase (Completed)   Amylase (Completed)   Hepatic function panel (Completed)       I am having Brittany [name] maintain her calcium-vitamin D, cholecalciferol, loratadine, multivitamin with minerals, vitamin B-12, clotrimazole-betamethasone, Ibuprofen-Famotidine, QUEtiapine, hydrOXYzine, and DULoxetine. We will continue to administer sodium chloride.   Follow-up: Return if symptoms worsen or fail to improve.  Mauricio Po, FNP

## 2016-11-19 NOTE — Patient Instructions (Addendum)
Thank you for choosing [facility].  SUMMARY AND INSTRUCTIONS:  Recommend following up with gastric bypass surgeon.   Work on eating smaller meals as able.  We will check blood work today.  Ensure adequate water and fiber.   Stool softener (docusate sodium) or stimulant (Sennakot / Miralax)   Labs:  Please stop by the lab on the lower level of the building for your blood work. Your results will be released to [facility] (or called to you) after review, usually within 72 hours after test completion. If any changes need to be made, you will be notified at that same time.  1.) The lab is open from 7:30am to 5:30 pm Monday-Friday 2.) No appointment is necessary 3.) Fasting (if needed) is 6-8 hours after food and drink; black coffee and water are okay   Follow up:  If your symptoms worsen or fail to improve, please contact our office for further instruction, or in case of emergency go directly to the emergency room at the closest medical facility.

## 2016-11-19 NOTE — Assessment & Plan Note (Signed)
Epigastric abdominal pain the setting of incisional hernia and status post gastric bypass surgery. CT scan with sludge noted within the gall bladder with non-calcified stones. Obtain lipase, hepatic function and amylase. Treat conservatively with reducing meal quantity. Recommend follow up evaluation with general surgery to assess for complication of gastric bypass. Follow up if symptoms worsen and consider additional imaging as indicated.

## 2016-12-29 ENCOUNTER — Ambulatory Visit (HOSPITAL_COMMUNITY): Payer: 59 | Admitting: Psychology

## 2017-01-12 ENCOUNTER — Ambulatory Visit (INDEPENDENT_AMBULATORY_CARE_PROVIDER_SITE_OTHER): Payer: 59 | Admitting: Psychology

## 2017-01-12 DIAGNOSIS — F329 Major depressive disorder, single episode, unspecified: Secondary | ICD-10-CM

## 2017-01-12 DIAGNOSIS — F331 Major depressive disorder, recurrent, moderate: Secondary | ICD-10-CM

## 2017-01-12 NOTE — Progress Notes (Signed)
   THERAPIST PROGRESS NOTE  Session Time: 12.32pm-1.35pm  Participation Level: Active  Behavioral Response: Well GroomedAlertDepressed  Type of Therapy: Individual Therapy  Treatment Goals addressed: Diagnosis: MDD and goal 1.  Interventions: CBT and Supportive  Summary: Brittany [name] is a 51 y.o. female who presents with depressed mood and affect.  Pt reported that she has been using her skills to cope w/ depression and feeling of loss- but is aware that struggling more this past month.  Pt reported that mother of her close friend grew up w/ died last month- felt good that was able to be present to her at the end of life- but still a loss.  More difficult stressor is that her youngest daughter (step- that raised) informed her she was a bad mother to her growing up and that didn't want contact w/ her current.  Pt reported how this is playing into negative self talk- aware that she was tough on them at times- but not a bad mother- there for them and that never in past has this daughter indicated this- in fact calling her 'best mother ever".  Pt however is struggling w/ this loss of contact on top of the divorce and worries that older daughter will be next.  Pt is able to see as distortion and challenge this thinking.  Pt discussed that she is considering a move to [place] to be closer to her support system.    Suicidal/Homicidal: Nowithout intent/plan  Therapist Response: Assessed pt current functioning per pt report.  Processed w/ pt depressed mood and how related to recent stressors and losses.  assisted pt in identifying distortions and challenging those. Discussed how to keep open toward daughter w/out pushing daughter.    Plan: Return again in 2 weeks.  Diagnosis: MDD    [name], [facility] 01/12/2017

## 2017-01-26 ENCOUNTER — Ambulatory Visit (INDEPENDENT_AMBULATORY_CARE_PROVIDER_SITE_OTHER): Payer: 59 | Admitting: Psychology

## 2017-01-26 DIAGNOSIS — F331 Major depressive disorder, recurrent, moderate: Secondary | ICD-10-CM

## 2017-01-26 NOTE — Progress Notes (Signed)
   THERAPIST PROGRESS NOTE  Session Time: 12.30pm-1.20pm  Participation Level: Active  Behavioral Response: Well GroomedAlertDepressed  Type of Therapy: Individual Therapy  Treatment Goals addressed: Diagnosis: MDD and goal 1.  Interventions: CBT and Supportive  Summary: Brittany [name] is a 51 y.o. female who presents with affect slightly depressed.  Pt reported that she had a good weekend visiting family/friend in [place].  Pt reported that she went for an interview w/ company applied and that interview went well.  Pt discussed how she had a lot of anxiety prior and had considered cancelling but glad she didn't.  Pt also reported that had  A lot of anxiety prior to her granddaughter's baptism a week ago- worried about interactions w ex.  Pt reported that she tends to get into script about how others don't care and that interactions are going to be awful.  Pt reports she is working to be more aware of this pattern and to reframe- which she did.  Pt reported that the event went well- didn't talk to each other- but was ok.  pt has another event this week w/ goddaughter's birthday celebration both invited to.  Pt is aware of same negative talk and reframing she is doing.  Pt discussed how still considering the move- if job offer but aware that scarey at same time..   Suicidal/Homicidal: Nowithout intent/plan  Therapist Response: Assessed pt current functioning per pt report.  Processed w/ pt coping w/ distorted thinking- increasing awareness, changing pattern and reframing.  reflected positive steps w/ this and that pt was able to accomplish and not avoid- each going well.    Plan: Return again in 2 weeks.  Diagnosis: MDD    [name], [facility] 01/26/2017

## 2017-02-03 ENCOUNTER — Ambulatory Visit (INDEPENDENT_AMBULATORY_CARE_PROVIDER_SITE_OTHER): Payer: 59 | Admitting: Psychiatry

## 2017-02-03 ENCOUNTER — Encounter (HOSPITAL_COMMUNITY): Payer: Self-pay | Admitting: Psychiatry

## 2017-02-03 DIAGNOSIS — F419 Anxiety disorder, unspecified: Secondary | ICD-10-CM

## 2017-02-03 DIAGNOSIS — Z818 Family history of other mental and behavioral disorders: Secondary | ICD-10-CM | POA: Diagnosis not present

## 2017-02-03 DIAGNOSIS — Z87891 Personal history of nicotine dependence: Secondary | ICD-10-CM | POA: Diagnosis not present

## 2017-02-03 DIAGNOSIS — Z62810 Personal history of physical and sexual abuse in childhood: Secondary | ICD-10-CM

## 2017-02-03 DIAGNOSIS — Z811 Family history of alcohol abuse and dependence: Secondary | ICD-10-CM | POA: Diagnosis not present

## 2017-02-03 DIAGNOSIS — F332 Major depressive disorder, recurrent severe without psychotic features: Secondary | ICD-10-CM

## 2017-02-03 MED ORDER — DULOXETINE HCL 60 MG PO CPEP: 60.0000 mg | ORAL_CAPSULE | Freq: Two times a day (BID) | ORAL | 0 refills | Status: DC

## 2017-02-03 MED ORDER — HYDROXYZINE PAMOATE 25 MG PO CAPS: 25.0000 mg | ORAL_CAPSULE | Freq: Every day | ORAL | 0 refills | Status: DC | PRN

## 2017-02-03 MED ORDER — QUETIAPINE FUMARATE 50 MG PO TABS: 150.0000 mg | ORAL_TABLET | Freq: Every day | ORAL | 2 refills | Status: DC
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
## 2017-02-09 ENCOUNTER — Ambulatory Visit (INDEPENDENT_AMBULATORY_CARE_PROVIDER_SITE_OTHER): Payer: 59 | Admitting: Psychology

## 2017-02-09 DIAGNOSIS — F332 Major depressive disorder, recurrent severe without psychotic features: Secondary | ICD-10-CM | POA: Diagnosis not present

## 2017-02-09 NOTE — Progress Notes (Signed)
   THERAPIST PROGRESS NOTE  Session Time: 12.30pm-1.20pm  Participation Level: Active  Behavioral Response: Well GroomedAlertDepressed  Type of Therapy: Individual Therapy  Treatment Goals addressed: Diagnosis: MDD and goal 1.  Interventions: CBT  Summary: Brittany [name] is a 51 y.o. female who presents with depressed mood and affect.  Pt reported that she didn't feel well Friday w/ stomach/ hernia issues- called out of work and then over weekend took it easy.  Pt reported that w/ not feeling well and not engaging in a lot she got too much "into her head" and negative thought cycle.  Thoughts of no one cares and then thoughts of should've use my skills that I didn't. Pt did report felt sad grieving dad and mad at ex for not choosing to stick in there w/ her.  Pt reported that the cognitive distortions and negative self talk on top of that scared her that in a bad pattern towards severe depression as in past. Pt was able to see how she is using skills today and reframe negative self talk about "should've" this weekend.  Pt discussed her supports and how to plan for downtime this weekend and use of positive self talk reminders.  Suicidal/Homicidal: Nowithout intent/plan  Therapist Response: Assessed pt current functioning per pt report.  Processed w/pt her depressed mood and depressive thinking this weekend.  Discussed natural emotions vs emotions from cognitive distortions and negative self talk.  Reflected negative self talk in session and assisted w/ reframing. Explored w/ pt plan for self care going into this weekend and supports to reach out to.   Plan: Return again in 2 weeks.  Diagnosis: MDD    [name], [facility] 02/09/2017

## 2017-02-16 ENCOUNTER — Ambulatory Visit (INDEPENDENT_AMBULATORY_CARE_PROVIDER_SITE_OTHER): Payer: 59 | Admitting: Psychology

## 2017-02-16 DIAGNOSIS — F331 Major depressive disorder, recurrent, moderate: Secondary | ICD-10-CM | POA: Diagnosis not present

## 2017-02-16 NOTE — Progress Notes (Signed)
   THERAPIST PROGRESS NOTE  Session Time: 12.30pm-1.18pm  Participation Level: Active  Behavioral Response: Well GroomedAlertaffect congruent- depressed moods  Type of Therapy: Individual Therapy  Treatment Goals addressed: Diagnosis: MDD and goal 1.  Interventions: CBT and Supportive  Summary: Brittany [name] is a 51 y.o. female who presents with depressed mood but improved since last visit.  Pt reported this past week was improved.  She was able to focus on work through the week, have a weekend w/out getting into negative self talk.  Pt reported she accomplished household chores and errands- but felt good to do.  Pt also reported talking to family through text.  Pt discussed that a friend that hadn't talk to in a while- called Wednesday and enjoyed conversation.  Pt also discussed how reminder of withdrawing from friends and some friends not being able to be support when depressed.  Pt discussed that she was able to gain from church sermon this weekend- awareness of how uncomfortable to feel feelings related to loss but important to feel so can heal.  Pt discussed how she was able to do this w/out turning into negative self talk. Pt discussed upcoming holidays and how will be a challenge this year.  Pt identified plans and supports to assist coping through.  Suicidal/Homicidal: Nowithout intent/plan  Therapist Response: Assessed pt current functioning per pt report.  Processed w/pt improved mood and efforts and coping skills used.  Explored w/pt interactions over past week and not turning into negative self talk.  Validated pt feelings re: holidays and how to begin preparing for w/ plans and supports and creating new traditions.  Plan: Return again in 2 weeks.  Diagnosis: MDD    [name], [facility] 02/16/2017

## 2017-02-16 NOTE — Addendum Note (Signed)
Addended by: Nelson Chimes on: 02/16/2017 03:49 PM   Modules accepted: Level of Service

## 2017-03-02 ENCOUNTER — Ambulatory Visit (HOSPITAL_COMMUNITY): Payer: Self-pay | Admitting: Psychology

## 2017-03-09 ENCOUNTER — Ambulatory Visit (INDEPENDENT_AMBULATORY_CARE_PROVIDER_SITE_OTHER): Payer: 59 | Admitting: Psychology

## 2017-03-09 DIAGNOSIS — F331 Major depressive disorder, recurrent, moderate: Secondary | ICD-10-CM

## 2017-03-09 NOTE — Progress Notes (Signed)
   THERAPIST PROGRESS NOTE  Session Time: 12.30pm-1.14pm  Participation Level: Active  Behavioral Response: Well GroomedAlertaffect wnl  Type of Therapy: Individual Therapy  Treatment Goals addressed: Diagnosis: MDD and goal 1.  Interventions: CBT and Supportive  Summary: Brittany [name] is a 51 y.o. female who presents with affect wnl.  pt reported that she did well during the holiday.  She reported she went to visit family in [place]- mom had surgery unexpected earlier that week, but is doing well and brother in law received cancer dx.  However, family was able to get together for Thanksgiving dinner and have some laughs.  Pt also was able to get together w/ close friend and talk/support each other. Pt reported that she spoke w/ her oldest daughter- that was positive and decided to no "chase down" her younger daughter and felt good to give break from that. Pt was aslo able to reminisce and reflect on dad w/out feeling depressed.  Pt reported that she doesn't plan on going to [place] for Christmas- needs break from travel.  Pt aware that 4 day weekend just at home not ideal so engaging w/ church activities and planning to get together w/ daughter and friends important. Pt discussed how christmas traditions so tied to family unit that doesn't want to celebrate in same ways- open to creating her own ways of celebrating .   Suicidal/Homicidal: Nowithout intent/plan  Therapist Response: Assessed pt current functioning per pt report. Processed w/pt coping w/ holidays, losses, family stressors.  Reflected use of coping skills, supports and not avoiding.  Discussed upcoming holidays and how to create new ways of celebrating.  Plan: Return again in 2 weeks.  Diagnosis: MDD    [name], [facility] 03/09/2017

## 2017-03-10 ENCOUNTER — Other Ambulatory Visit (HOSPITAL_COMMUNITY): Payer: Self-pay | Admitting: Psychiatry

## 2017-03-10 DIAGNOSIS — F332 Major depressive disorder, recurrent severe without psychotic features: Secondary | ICD-10-CM

## 2017-03-23 ENCOUNTER — Ambulatory Visit (HOSPITAL_COMMUNITY): Payer: Self-pay | Admitting: Psychology

## 2017-03-24 ENCOUNTER — Other Ambulatory Visit (HOSPITAL_COMMUNITY): Payer: Self-pay | Admitting: Psychiatry

## 2017-04-02 ENCOUNTER — Ambulatory Visit (INDEPENDENT_AMBULATORY_CARE_PROVIDER_SITE_OTHER): Payer: 59 | Admitting: Psychology

## 2017-04-02 DIAGNOSIS — F331 Major depressive disorder, recurrent, moderate: Secondary | ICD-10-CM

## 2017-04-02 NOTE — Progress Notes (Signed)
   THERAPIST PROGRESS NOTE  Session Time: 12.30pm-1.25pm  Participation Level: Active  Behavioral Response: Well GroomedAlertDepressed  Type of Therapy: Individual Therapy  Treatment Goals addressed: Diagnosis: MDD and goal 1.  Interventions: CBT and Supportive  Summary: Brittany [name] is a 51 y.o. female who presents with depressed mood.  Pt reported that mood hasn't been good w/ holidays- feels like reminder of what doesn't have since divorce.  Pt values time w/ those and enjoyed so many of the Christmas traditions w/ her girls, ex and in laws- that difficult "rewriting" this time of year for self.  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 Pt aware that this weekend will be difficult but that can impact w/how she approaches- so is identifying things to do each day and focus on positive and what grateful for.  Pt is looking forward to time w/ daughter and her family Christmas day.   Suicidal/Homicidal: Nowithout intent/plan  Therapist Response: Assessed pt current functioning per pt report. Processed w/pt coping w/ stressor of holiday and feeling loss of family w/ divorce.  Explored w/pt how to cope and planning for self care and health distractions.   Plan: Return again in 2 weeks.  Diagnosis: MDD    [name], [facility] 04/02/2017

## 2017-04-21 ENCOUNTER — Ambulatory Visit (INDEPENDENT_AMBULATORY_CARE_PROVIDER_SITE_OTHER): Payer: 59 | Admitting: Psychology

## 2017-04-21 DIAGNOSIS — F332 Major depressive disorder, recurrent severe without psychotic features: Secondary | ICD-10-CM

## 2017-04-21 NOTE — Progress Notes (Signed)
   THERAPIST PROGRESS NOTE  Session Time: 10.10am-11.05am  Participation Level: Active  Behavioral Response: Well GroomedAlertDepressed and tearful.  Type of Therapy: Individual Therapy  Treatment Goals addressed: Diagnosis: MDD and goal 1.  Interventions: CBT and Supportive  Summary: [name] is a 52 y.o. female who presents with depressed mood and affect.  Pt reported she did go w/ mom, sister, niece to visit mom's family in [place] and "the trip went well into I ruined it".  Pt reported on the drive back she lost it and had an outburst- coming to a complete stop on the [place] when became overwhelmed by being lost.  Pt reported that sister's reaction to her felt judgmental and crazy.  Pt reported that she ended up shutting down and not talking to family the remainder of trip for fear of further outbursts and saying things to make further upset.  Pt reported that she did reach out to sister 2 days ago- apologizing for poor judgement- but feels that her sister just sees as needing change in med.  Pt negative self talk that she is losing relationship w/ sister and mom- messing things up.  Pt reported that finding more difficult to use her skills and that she is just feeling exhausted w/ the constant trying. Pt does report some SI- denies any intent, no plan- but recognizes need for more support from tx as deteriorating.    Suicidal/Homicidal: Nowithout intent/plan  Therapist Response: Assessed pt current functioning per pt report.  Processed w/pt interaction w/ family over trip and incident of conflict.  Explored w/pt her thoughts and assisted in identifying distortions and how didn't "mess everything up".  Discussed pt needs and continued struggles w/ mood and depressive  Thoughts.  Discussed referral to IOP and connected w/ case manager.   Plan: referral to IOP- scheduled to start 02/21/18.  Pt to seek crisis services if any SI w/ intent or plan or further deterioration.   Diagnosis: MDD,  recurrent, severe.    [name], [facility] 04/21/2017

## 2017-04-23 ENCOUNTER — Encounter (HOSPITAL_COMMUNITY): Payer: Self-pay | Admitting: Psychiatry

## 2017-04-23 ENCOUNTER — Other Ambulatory Visit (HOSPITAL_COMMUNITY): Payer: 59 | Attending: Psychiatry | Admitting: Psychiatry

## 2017-04-23 DIAGNOSIS — I10 Essential (primary) hypertension: Secondary | ICD-10-CM | POA: Diagnosis not present

## 2017-04-23 DIAGNOSIS — F332 Major depressive disorder, recurrent severe without psychotic features: Secondary | ICD-10-CM | POA: Insufficient documentation

## 2017-04-23 DIAGNOSIS — K219 Gastro-esophageal reflux disease without esophagitis: Secondary | ICD-10-CM | POA: Insufficient documentation

## 2017-04-23 DIAGNOSIS — F4310 Post-traumatic stress disorder, unspecified: Secondary | ICD-10-CM | POA: Insufficient documentation

## 2017-04-23 DIAGNOSIS — F419 Anxiety disorder, unspecified: Secondary | ICD-10-CM | POA: Insufficient documentation

## 2017-04-23 DIAGNOSIS — G43909 Migraine, unspecified, not intractable, without status migrainosus: Secondary | ICD-10-CM | POA: Insufficient documentation

## 2017-04-23 DIAGNOSIS — F331 Major depressive disorder, recurrent, moderate: Secondary | ICD-10-CM | POA: Diagnosis present

## 2017-04-23 NOTE — Progress Notes (Signed)
    Daily Group Progress Note  Program: IOP  Group Time: 9:00-12:00  Participation Level: Active  Behavioral Response: Appropriate  Type of Therapy:  Group Therapy  Summary of Progress: Pt. Presented as talkative, engaged in the group process, made good eye contact with the counselor and other group members. Pt. Completed intake assessment with the case manager and the psychiatrist. Pt. Participated in discussions with the mental health association and the Chaplain.     Nancie Neas, LPC

## 2017-04-23 NOTE — Progress Notes (Signed)
Psychiatric Initial Adult Assessment   Patient Identification: Brittany [name] MRN:  [id number] Date of Evaluation:  04/23/2017 Referral Source: self Chief Complaint:   Chief Complaint    Depression     Visit Diagnosis:    ICD-10-CM   1. Severe recurrent major depression without psychotic features ([place]) F33.2     History of Present Illness:  Ms Brittany [name] has gotten depressed again.  Her father died, her friend who was a second mother died, her husband requested and got a divorce and one of her step daughters is not speaking to her for some reason.  She has a trip with her mother and sisters where she got lost and was getting advice from [place] all [place] and lost it with them.  Now they are treating her like a mental case which was the last straw.  She already feels alone and depressed after working so hard to feel better and that sunk her into sadness, crying, suicidal thoughts, less energy, interest and motivation, isolating and a feeling of hopelessness.    Associated Signs/Symptoms: Depression Symptoms:  depressed mood, anhedonia, insomnia, hypersomnia, fatigue, feelings of worthlessness/guilt, difficulty concentrating, hopelessness, suicidal thoughts without plan, anxiety, (Hypo) Manic Symptoms:  Irritable Mood, Anxiety Symptoms:  Excessive Worry, Psychotic Symptoms:  none PTSD Symptoms: Negative  Past Psychiatric History: patient in IOP about a year ago  Previous Psychotropic Medications: Yes   Substance Abuse History in the last 12 months:  Yes.    Consequences of Substance Abuse: Negative  Past Medical History:  Past Medical History:  Diagnosis Date  . Allergy   . Anxiety   . Arthritis    hips, knees and hands   . Depression   . GERD (gastroesophageal reflux disease)    hx of   . H/O hiatal hernia   . History of chicken pox   . Hypercholesterolemia   . Hypertension   . Migraine   . Migraines   . Morbid obesity ([place])   . Positive TB test    . PTSD (post-traumatic stress disorder)   . Sleep apnea    no CPAP machine     Past Surgical History:  Procedure Laterality Date  . ABDOMINAL HYSTERECTOMY  1997 & 2006  . BREATH TEK H PYLORI  05/03/2012   Procedure: BREATH TEK H PYLORI;  Surgeon: [name], MD;  Location: [name];  Service: General;  Laterality: N/A;  . COLONOSCOPY     hx of benign polyps   . FOOT SURGERY    . GASTRIC BYPASS    . GASTRIC ROUX-EN-Y N/A 07/05/2012   Procedure: LAPAROSCOPIC ROUX-EN-Y GASTRIC;  Surgeon: [name], MD;  Location: [facility];  Service: General;  Laterality: N/A;  . IRRIGATION AND DEBRIDEMENT ABSCESS N/A 01/01/2014   Procedure: IRRIGATION AND DEBRIDEMENT ABSCESS;  Surgeon: [name], MD;  Location: [facility];  Service: General;  Laterality: N/A;  . LAPAROSCOPIC LYSIS OF ADHESIONS N/A 07/05/2012   Procedure: LAPAROSCOPIC LYSIS OF ADHESIONS;  Surgeon: [name], MD;  Location: [facility];  Service: General;  Laterality: N/A;  repair of abdominal wall hernia  . right tube and ovary removed     . UPPER GI ENDOSCOPY N/A 07/05/2012   Procedure: UPPER GI ENDOSCOPY;  Surgeon: [name], MD;  Location: [facility];  Service: General;  Laterality: N/A;    Family Psychiatric History: none  Family History:  Family History  Problem Relation Age of Onset  . Colon cancer Mother 23  .  Depression Father   . Diabetes Father   . Heart disease Father   . Bladder Cancer Father   . Depression Sister   . Post-traumatic stress disorder Sister   . Alcohol abuse Paternal Uncle   . Bipolar disorder Cousin   . Anxiety disorder Sister   . Cancer Maternal Aunt        breast  . Melanoma Maternal Grandmother   . Colon cancer Maternal Grandmother        does not know age of onset  . Healthy Maternal Grandfather   . Healthy Paternal Grandmother   . Heart disease Paternal Grandfather   . Stroke Paternal Grandfather   . Diabetes Paternal Grandfather     Social History:   Social History    Socioeconomic History  . Marital status: Legally Separated    Spouse name: None  . Number of children: 0  . Years of education: 70  . Highest education level: None  Social Needs  . Financial resource strain: None  . Food insecurity - worry: None  . Food insecurity - inability: None  . Transportation needs - medical: None  . Transportation needs - non-medical: None  Occupational History  . Occupation: Software engineer   Tobacco Use  . Smoking status: Former Smoker    Packs/day: 0.10    Years: 1.00    Pack years: 0.10    Types: Cigarettes    Last attempt to quit: 04/27/2004    Years since quitting: 12.9  . Smokeless tobacco: Never Used  . Tobacco comment: Smokes one at times.   Substance and Sexual Activity  . Alcohol use: No    Alcohol/week: 0.0 oz    Comment: Occasional use/ 2 drinks a month  . Drug use: No  . Sexual activity: Not Currently    Birth control/protection: Surgical  Other Topics Concern  . None  Social History Narrative   Fun: Color and walk, house renovations   Denies abuse and feels safe at home.     Additional Social History: doing better at work  Allergies:   Allergies  Allergen Reactions  . Amoxicillin Hives    .Marland KitchenHas patient had a PCN reaction causing immediate rash, facial/tongue/throat swelling, SOB or lightheadedness with hypotension: Yes Has patient had a PCN reaction causing severe rash involving mucus membranes or skin necrosis: No Has patient had a PCN reaction that required hospitalization No Has patient had a PCN reaction occurring within the last 10 years: No If all of the above answers are "NO", then may proceed with Cephalosporin use.   . Bactrim [Sulfamethoxazole-Trimethoprim] Hives and Rash  . Lamictal [Lamotrigine] Hives and Rash    Metabolic Disorder Labs: Lab Results  Component Value Date   HGBA1C 5.2 11/03/2014   MPG 108 10/12/2014   MPG 111 06/13/2014   No results found for: PROLACTIN Lab Results   Component Value Date   CHOL 228 (H) 03/18/2016   TRIG 82.0 03/18/2016   HDL 71.40 03/18/2016   CHOLHDL 3 03/18/2016   VLDL 16.4 03/18/2016   LDLCALC 140 (H) 03/18/2016   LDLCALC 155 (H) 11/03/2014     Current Medications: Current Outpatient Medications  Medication Sig Dispense Refill  . calcium-vitamin D (OSCAL WITH D) 500-200 MG-UNIT tablet Take 3 tablets by mouth every morning. For bone health    . cholecalciferol (VITAMIN D) 1000 units tablet Take 1 tablet (1,000 Units total) by mouth daily. For bone health    . clotrimazole-betamethasone (LOTRISONE) cream Apply 1 application topically  2 (two) times daily. 30 g 0  . DULoxetine (CYMBALTA) 60 MG capsule Take 1 capsule (60 mg total) by mouth 2 (two) times daily. 180 capsule 0  . hydrOXYzine (VISTARIL) 25 MG capsule Take 1 capsule (25 mg total) by mouth daily as needed for anxiety. 30 capsule 0  . Ibuprofen-Famotidine 800-26.6 MG TABS Take 1 tablet (800 mg) every 8 hours as needed for pain. 90 tablet 0  . loratadine (CLARITIN) 10 MG tablet Take 1 tablet (10 mg total) by mouth daily. For allergies    . Multiple Vitamins-Minerals (MULTIVITAMIN WITH MINERALS) tablet Take 1 tablet by mouth daily. For low vitamin    . QUEtiapine (SEROQUEL) 50 MG tablet Take 3 tablets (150 mg total) by mouth at bedtime. 90 tablet 2  . vitamin B-12 (CYANOCOBALAMIN) 500 MCG tablet Take 1 tablet (500 mcg total) by mouth daily. For Vitamin B-12 replacement     Current Facility-Administered Medications  Medication Dose Route Frequency Provider Last Rate Last Dose  . 0.9 %  sodium chloride infusion  500 mL Intravenous Continuous Ladene Artist, MD        Neurologic: Headache: Negative Seizure: Negative Paresthesias:Negative  Musculoskeletal: Strength & Muscle Tone: within normal limits Gait & Station: normal Patient leans: N/A  Psychiatric Specialty Exam: ROS  There were no vitals taken for this visit.There is no height or weight on file to calculate  BMI.  General Appearance: Well Groomed  Eye Contact:  Good  Speech:  Clear and Coherent  Volume:  Normal  Mood:  Anxious and Depressed  Affect:  Congruent  Thought Process:  Coherent and Goal Directed  Orientation:  Full (Time, Place, and Person)  Thought Content:  Logical  Suicidal Thoughts:  Yes.  without intent/plan  Homicidal Thoughts:  No  Memory:  Immediate;   Good Recent;   Good Remote;   Good  Judgement:  Good  Insight:  Good  Psychomotor Activity:  Normal  Concentration:  Concentration: Good and Attention Span: Good  Recall:  Good  Fund of Knowledge:Good  Language: Good  Akathisia:  Negative  Handed:  Right  AIMS (if indicated):  0  Assets:  Communication Skills Desire for Improvement Financial Resources/Insurance Housing Leisure Time English Talents/Skills Transportation Vocational/Educational  ADL's:  Intact  Cognition: WNL  Sleep:  poor    Treatment Plan Summary: Admit to IOP with daily group therapy   Donnelly Angelica, MD 1/10/20192:53 PM
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
## 2017-04-24 ENCOUNTER — Other Ambulatory Visit (HOSPITAL_COMMUNITY): Payer: 59 | Admitting: Psychiatry

## 2017-04-24 DIAGNOSIS — F332 Major depressive disorder, recurrent severe without psychotic features: Secondary | ICD-10-CM

## 2017-04-27 ENCOUNTER — Other Ambulatory Visit (HOSPITAL_COMMUNITY): Payer: 59 | Admitting: Psychiatry

## 2017-04-27 DIAGNOSIS — F332 Major depressive disorder, recurrent severe without psychotic features: Secondary | ICD-10-CM

## 2017-04-27 NOTE — Progress Notes (Signed)
    Daily Group Progress Note  Program: IOP  Group Time: 9:00-12:00  Participation Level: Active  Behavioral Response: Appropriate  Type of Therapy:  Group Therapy  Summary of Progress: Pt. Presented as talkative, engaged in the group process. Pt. Participated in medication education group with the pharmacist. Pt. Discussed her disappointment with interactions with her family over [place] who told her that they thought that there were problems with her medication and that she needed to see her doctor based on how she was acting. Pt. Felt that all of the progress that she has made in her recovery was invalidated. Pt. Participated in discussion about how to communicate to members of our support system about how to communicate feedback in a sensitive manner and that they should communicate that even when effectively managing a mood disorder should be allowed to have an occasional bad day.      Nancie Neas, LPC

## 2017-04-27 NOTE — Progress Notes (Signed)
    Daily Group Progress Note  Program: IOP  Group Time: 9:00-12:00  Participation Level: Active  Behavioral Response: Appropriate  Type of Therapy:  Group Therapy  Summary of Progress: Pt. Presented as talkative, engaged in the group process. Reported that she was doing "good", but that she was very tired from the previous day in group. Pt. Discussed that the grief and loss group had been good for her but very difficult. Pt. Discussed with the group that she was grieving the loss of her former self and the end of her 66 year marriage. Pt. Participated in discussion about the use of yoga to complement the treatment of depression and anxiety.    Nancie Neas, LPC

## 2017-04-28 ENCOUNTER — Other Ambulatory Visit (HOSPITAL_COMMUNITY): Payer: 59 | Admitting: Psychiatry

## 2017-04-28 DIAGNOSIS — F332 Major depressive disorder, recurrent severe without psychotic features: Secondary | ICD-10-CM

## 2017-04-28 MED ORDER — LORAZEPAM 0.5 MG PO TABS: 0.5000 mg | ORAL_TABLET | Freq: Three times a day (TID) | ORAL | 0 refills | Status: DC | PRN

## 2017-04-28 NOTE — Progress Notes (Signed)
Progress note   Ms Heady has had severe anxiety over the past 24 hrs.  She is doing all we ask her to do, she does not want to use medication but says she cannot keep this up either with no sleep and no way to relax or concentrate.  Plan is to try lorazepam 0.5 mg tid prn for anxiety on a temporary basis

## 2017-04-29 ENCOUNTER — Other Ambulatory Visit (HOSPITAL_COMMUNITY): Payer: 59 | Admitting: Psychiatry

## 2017-04-29 DIAGNOSIS — F332 Major depressive disorder, recurrent severe without psychotic features: Secondary | ICD-10-CM

## 2017-04-30 ENCOUNTER — Other Ambulatory Visit (HOSPITAL_COMMUNITY): Payer: 59 | Admitting: Psychiatry

## 2017-04-30 DIAGNOSIS — F332 Major depressive disorder, recurrent severe without psychotic features: Secondary | ICD-10-CM

## 2017-05-01 ENCOUNTER — Other Ambulatory Visit (HOSPITAL_COMMUNITY): Payer: 59 | Admitting: Psychiatry

## 2017-05-01 DIAGNOSIS — F332 Major depressive disorder, recurrent severe without psychotic features: Secondary | ICD-10-CM

## 2017-05-01 NOTE — Progress Notes (Signed)
    Daily Group Progress Note  Program: IOP  Group Time: 9:00-12:00  Participation Level: Active  Behavioral Response: Appropriate  Type of Therapy:  Group Therapy  Summary of Progress: Pt. Presented as talkative, engaged in the group process. Pt. Shared that she was "doing alright". Pt. Discussed that she was going to work a half day after group and that she was very tired and would rest most of the day tomorrow, walk the dogs and probably go to church on Sunday. Pt. Discussed that she has been doing a lot of thinking about the stigma of treatment and the pressure of not being allowed to have a good day by family and friends that feels very unfair to her. Pt. Shared a song with the group "Here I Go Again" and listened to music selections offered by other group members that inspire them and helped them to describe their emotional states. Pt. Listened to reflection and suggestion for meditation from book of awakening.        Nancie Neas, LPC

## 2017-05-04 ENCOUNTER — Other Ambulatory Visit (HOSPITAL_COMMUNITY): Payer: 59 | Admitting: Psychiatry

## 2017-05-04 DIAGNOSIS — F332 Major depressive disorder, recurrent severe without psychotic features: Secondary | ICD-10-CM | POA: Diagnosis not present

## 2017-05-05 ENCOUNTER — Other Ambulatory Visit (HOSPITAL_COMMUNITY): Payer: 59 | Admitting: Psychiatry

## 2017-05-05 DIAGNOSIS — F332 Major depressive disorder, recurrent severe without psychotic features: Secondary | ICD-10-CM

## 2017-05-05 NOTE — Progress Notes (Signed)
    Daily Group Progress Note  Program: IOP Time: 9:00-12:00  Type of Therapy: Group Therapy   Participation Level:  Active   Participation Quality:  Appropriate   Affect:  Appropriate   Cognitive:  Appropriate   Insight:  Appropriate   Engagement in Group:  Engaged   Modes of Intervention:  Problem-solving  Patient is a 52 year old female with a diagnosis of Major Depressive Disorder. Pt presented to the Psych IOP due to increased depression and past history of suicide thoughts. Counselor utilized therapeutic skills and motivational interviewing skills and group processing as a means for therapeutic intervention.  This first and second hour of today's session focused on "This past weekend" and the associated symptoms related to her depression. The last hour was an invited guest speaker (Chaplain/Bob). Patient reported having a depressed weekend. She did not feel motivated to leave her home and isolated herself from socializing with others. She purposely made an attempt not to contact her family/friends. Pt reports her primary trigger(s) for her depression this weekend was related to mood instability and "Feeling like other people don't understand me". She reported that her family make negative remarks to her about her depression. The group was able to process with patient her symptoms and encouraged her to use coping skills. Group members further encouraged patient to write a letter to her family explaining her depression.  Group members also suggested activities that would help client with mind and social engagement on the weekends. Patient was receptive to the advice of her peers.  Pt is dressed in street clothes, alert, oriented x4 with normal speech and normal motor behavior. Eye contact is fair. She was tearful. Pt's mood is sad and affect is congruent with mood. Thought process is coherent and relevant. Pt's insight is fair and judgement is fair. There is no indication Pt is currently  responding to internal stimuli or experiencing delusional thought content. Pt was cooperative throughout.  Nancie Neas, LPC

## 2017-05-06 ENCOUNTER — Ambulatory Visit (HOSPITAL_COMMUNITY): Payer: Self-pay | Admitting: Psychiatry

## 2017-05-06 ENCOUNTER — Other Ambulatory Visit (HOSPITAL_COMMUNITY): Payer: 59 | Admitting: Psychiatry

## 2017-05-06 DIAGNOSIS — F332 Major depressive disorder, recurrent severe without psychotic features: Secondary | ICD-10-CM

## 2017-05-06 NOTE — Progress Notes (Signed)
    Daily Group Progress Note  Program: IOP  Group Time: 9:00-12:00 Participation Level: Active Behavioral Response: Appropriate, Sharing Type of Therapy:  Group Summary of Progress: Group session focused on how past experiences can affect current moods and behaviors. Members shared present awareness on their current standpoints in life. Group session focused on correlating the past with how positive or negative thoughts are further shaped and processed. The session also focused on the results of goal setting, positive affirmations of change and relating to the experiences of other group members. Pt. seemed a bit down during the group session. Appears very sensitive towards how she is being perceived by others. Constantly stated that no one cares about her even though she knows that she may be thinking irrationally. She expressed self-doubt in accepting her mental health and being positively perceived by the society. She is also a bit resistant from seeking change and goal setting.  Nancie Neas, LPC

## 2017-05-06 NOTE — Progress Notes (Signed)
    Daily Group Progress Note  Program: IOP  Summary of Progress: Group Time: 9:00-12:00 Participation Level: Active Behavioral Response: Appropriate, Sharing,  Type of Therapy:  Group Summary of Progress: Group session was focused on normalizing experienced anger and distinguishing it with the other feelings associated with the anger. Session also focused on being how to become more comfortable with unpleasant emotions in order to develop productive emotional regulation. Lastly, topics of self care was addressed in terms of positively coping with negative life situations. Pt. appeared talkative and receptive towards herself and the group members. Was able to share her insights about spirituality and religion and how utilizing both concepts may aid her in regulating her life experiences. Pt.openly expressed her unfavorable experience from the previous group. Positively confronted what bothered her about the past event and was receptive towards receiving feedback from the group members. Also expressed interest in learning strategies for telling people how she feels instead of just tossing it under the rug.  Nancie Neas, LPC

## 2017-05-07 ENCOUNTER — Other Ambulatory Visit (HOSPITAL_COMMUNITY): Payer: 59 | Admitting: Psychiatry

## 2017-05-07 DIAGNOSIS — F332 Major depressive disorder, recurrent severe without psychotic features: Secondary | ICD-10-CM | POA: Diagnosis not present

## 2017-05-07 NOTE — Progress Notes (Signed)
    Daily Group Progress Note  Program: IOP  Group Time: 0900-1200  Participation Level: Active  Behavioral Response: Appropriate and Sharing  Type of Therapy:  Process Group  Summary of Progress:  Pt was very active in the groups today.  Voiced that she purchased a new journal to write in.  Pt was able to share her struggle with past SI.  She was able to share her experience with having to be admitted on the inpt unit.  "That was the best thing that ever happened to me."      Carlis Abbott, RITA, M.Ed, CNA

## 2017-05-07 NOTE — Progress Notes (Signed)
Patient ID: Brittany [name], female   DOB: Jan 02, 1966, 52 y.o.   MRN: [id number] [facility] IOP DISCHARGE NOTE  Patient:  RAPHAEL ESPE DOB:  1965-05-30  Date of Admission: 04/23/2017  Date of Discharge: 05/08/2017  Reason for Admission:depression  IOP Course:attended and participated to good benefit.  She feels much better and able to deal with her anxiety and depression better.  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

## 2017-05-08 ENCOUNTER — Other Ambulatory Visit (HOSPITAL_COMMUNITY): Payer: 59 | Admitting: Psychiatry

## 2017-05-08 DIAGNOSIS — F332 Major depressive disorder, recurrent severe without psychotic features: Secondary | ICD-10-CM | POA: Diagnosis not present
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
## 2017-05-08 NOTE — Progress Notes (Signed)
    Daily Group Progress Note  Program: IOP  Group Time: 9:00-12:00 Participation Level: Active Behavioral Response: Appropriate, Sharing,  Type of Therapy:  Group Summary of Progress: Group session was focused on themes involving stressors and dissatisfaction with social and work related areas in life. Session also address changes that may need to occur as well as ideas on how to achieve them. Lastly, the topic of self-care was brought up in order to further assess wellness within the lives of the patients. Patient mentioned feeling okay. Spoke about how she's aware that sometimes she does not seek help when needed. Further, during periods of depression, patient sometimes isolates herself with no motivation to open herself to others. Patient also positively expressed how taking breaks from the media and its distractions have been helpful for her. In regards to self-care, patient is open to trying to let others be kind enough to help her.  Nancie Neas, LPC

## 2017-05-08 NOTE — Progress Notes (Signed)
    Daily Group Progress Note  Program: IOP   Group Time: 9:00-12:00 Participation Level: Active Behavioral Response: Appropriate and sharing Type of Therapy:  Group Summary of Progress: Pt. expressed that she feels depressed and isolated.  She's not motivated to do the things that would help her feel better.  She shared some of the personal issues that she has gone through with her divorce, difficult siblings, and family members who are very self-centered and inconsiderate.   She sets boundaries to distance herself from some of the drama.  She was very supportive of another Pt. and offered some ideas to help her cope with  similar issues that she is having. Pt. is aware of the things that she needs to do, however, she's having a hard time doing them. The second part of group was a session on wellness facilitated by Frederich Balding.  She emphasized the importance of incorporating daily wellness practice in your life through exercise, nutrition, successful sleep, stress management and self- compassion.  Everyone participated in a short stretching exercise routine that was very relaxing.     Nancie Neas, LPC
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
## 2017-05-08 NOTE — Patient Instructions (Signed)
D:  Patient successfully completed [facility]-IOP today.  A:  Discharge today.  Follow up with Dr. Adele Schilder on 06-12-17 @ 8:30 a.m and [name], [facility] on 05-11-17 @ 1:30 pm.  Encouraged support groups.  R:  Patient receptive.

## 2017-05-11 ENCOUNTER — Other Ambulatory Visit (HOSPITAL_COMMUNITY): Payer: 59

## 2017-05-11 ENCOUNTER — Ambulatory Visit (INDEPENDENT_AMBULATORY_CARE_PROVIDER_SITE_OTHER): Payer: 59 | Admitting: Psychology

## 2017-05-11 DIAGNOSIS — F331 Major depressive disorder, recurrent, moderate: Secondary | ICD-10-CM | POA: Diagnosis not present

## 2017-05-11 NOTE — Progress Notes (Signed)
    Daily Group Progress Note  Program: IOP  Group Time: 9:00-12:00  Participation Level: Active  Behavioral Response: Appropriate  Type of Therapy:  Group therapy  Summary of Progress: Pt. was talkative and actively engaged in the group. Themes of self care, emotional expression, love/relationships, mindfulness, and grief/loss. Pt. Shared that she was struggling with anxiety today, but it helped her to get out of the house and come to group. Pt. Discussed that she knew that she was isolating and needed to get out of her head and thinking that "nobody cares about me" when she is aware that this thought is not rational and a part of her depression. Pt. participated in grief and loss session with the Chaplain. The Chaplain explained the various ways people grieve and how difficult the grieving process can be, but can get better over time.  Nancie Neas, LPC

## 2017-05-11 NOTE — Progress Notes (Signed)
   THERAPIST PROGRESS NOTE  Session Time: 1.25pm-2.27pm  Participation Level: Active  Behavioral Response: Well GroomedAlertaffect wnl  Type of Therapy: Individual Therapy  Treatment Goals addressed: Diagnosis: MDD and goal 1.  Interventions: CBT  Summary: Brittany [name] is a 52 y.o. female who presents with affect wnl.  Pt reported that over the past several days mood has been good.  Pt reported that IOP was very beneficial for her.  Pt reported she is journaling again and reading awakenings.  Pt reported that she was able to followthrough w/ sending text to mom and sister to clear the area about conflict.  Pt reported both were receptive- she was able to talk w/ mom and share her perspective and listen as well. Pt reported her and sister haven't had talk- but sister aware and sister actually reached out to her for support of problem.  Pt discussed her self care skills and acknowledging process.  Pt also discussed focusing on letting go of things not hers to fix- work etc   Suicidal/Homicidal: Nowithout intent/plan  Therapist Response: Assessed pt current functioning per pt report. Processed w/pt gains from IOP and how using in her day to day.  Discussed w/pt communication and conflict resolution w/ family.  Reflected pt steps, efforts and facing worries.   Plan: Return again in 1 weeks.  Diagnosis: MDD   [name], [facility] 05/11/2017
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
## 2017-05-12 ENCOUNTER — Other Ambulatory Visit (HOSPITAL_COMMUNITY): Payer: 59

## 2017-05-13 ENCOUNTER — Other Ambulatory Visit (HOSPITAL_COMMUNITY): Payer: 59

## 2017-05-14 ENCOUNTER — Other Ambulatory Visit (HOSPITAL_COMMUNITY): Payer: 59

## 2017-05-15 ENCOUNTER — Other Ambulatory Visit (HOSPITAL_COMMUNITY): Payer: 59

## 2017-05-17 ENCOUNTER — Other Ambulatory Visit (HOSPITAL_COMMUNITY): Payer: Self-pay | Admitting: Psychiatry

## 2017-05-17 DIAGNOSIS — F332 Major depressive disorder, recurrent severe without psychotic features: Secondary | ICD-10-CM

## 2017-05-18 ENCOUNTER — Other Ambulatory Visit (HOSPITAL_COMMUNITY): Payer: 59

## 2017-05-19 ENCOUNTER — Other Ambulatory Visit (HOSPITAL_COMMUNITY): Payer: 59

## 2017-05-20 ENCOUNTER — Other Ambulatory Visit (HOSPITAL_COMMUNITY): Payer: 59

## 2017-05-21 ENCOUNTER — Other Ambulatory Visit (HOSPITAL_COMMUNITY): Payer: 59

## 2017-05-22 ENCOUNTER — Other Ambulatory Visit (HOSPITAL_COMMUNITY): Payer: 59

## 2017-05-25 ENCOUNTER — Other Ambulatory Visit (HOSPITAL_COMMUNITY): Payer: 59

## 2017-05-26 ENCOUNTER — Other Ambulatory Visit (HOSPITAL_COMMUNITY): Payer: 59

## 2017-05-26 ENCOUNTER — Ambulatory Visit (INDEPENDENT_AMBULATORY_CARE_PROVIDER_SITE_OTHER): Payer: 59 | Admitting: Psychology

## 2017-05-26 DIAGNOSIS — F331 Major depressive disorder, recurrent, moderate: Secondary | ICD-10-CM

## 2017-05-26 NOTE — Progress Notes (Signed)
   THERAPIST PROGRESS NOTE  Session Time: 11am-11.40am  Participation Level: Active  Behavioral Response: Well GroomedAlertaffect wnl  Type of Therapy: Individual Therapy  Treatment Goals addressed: Diagnosis: MDD and goal 1.  Interventions: CBT and Supportive  Summary: Brittany [name] is a 52 y.o. female who presents with affect wnl.  Pt reported that she has continued to do well this past 2 weeks.  Pt reported that she is continuing contact w/ friends and family and this has been positive.  Pt reported that she made decision to join an online dating site and has been overwhelming at times and entertaining at times.  Pt discussed the positives of knowing she can have an interaction towards getting to know someone.  Pt reported that she also feels good that if becomes "too much" can just delete as well.  Pt discussed using her skills of journaling and reviewing the day as w/out ruminating on negatives or negative self talk has been beneficial.  .   Suicidal/Homicidal: Nowithout intent/plan  Therapist Response: Assessed pt current functioning per pt report. Processed w/pt interactions and positives of keeping engaged and using her coping skills.  Explored w/pt self talk and being an observer of her day w/out making judgement on self.  Plan: Return again in 2 weeks.  Diagnosis: MDD   [name], [facility] 05/26/2017

## 2017-05-27 ENCOUNTER — Other Ambulatory Visit (HOSPITAL_COMMUNITY): Payer: 59

## 2017-05-28 ENCOUNTER — Other Ambulatory Visit (HOSPITAL_COMMUNITY): Payer: 59

## 2017-05-29 ENCOUNTER — Other Ambulatory Visit (HOSPITAL_COMMUNITY): Payer: 59

## 2017-06-01 ENCOUNTER — Other Ambulatory Visit (HOSPITAL_COMMUNITY): Payer: 59

## 2017-06-02 ENCOUNTER — Other Ambulatory Visit (HOSPITAL_COMMUNITY): Payer: 59

## 2017-06-03 ENCOUNTER — Other Ambulatory Visit (HOSPITAL_COMMUNITY): Payer: 59

## 2017-06-04 ENCOUNTER — Other Ambulatory Visit (HOSPITAL_COMMUNITY): Payer: 59

## 2017-06-05 ENCOUNTER — Other Ambulatory Visit (HOSPITAL_COMMUNITY): Payer: 59

## 2017-06-08 ENCOUNTER — Other Ambulatory Visit (HOSPITAL_COMMUNITY): Payer: 59

## 2017-06-09 ENCOUNTER — Other Ambulatory Visit (HOSPITAL_COMMUNITY): Payer: 59

## 2017-06-09 ENCOUNTER — Ambulatory Visit (INDEPENDENT_AMBULATORY_CARE_PROVIDER_SITE_OTHER): Payer: 59 | Admitting: Psychology

## 2017-06-09 DIAGNOSIS — F331 Major depressive disorder, recurrent, moderate: Secondary | ICD-10-CM

## 2017-06-09 DIAGNOSIS — F329 Major depressive disorder, single episode, unspecified: Secondary | ICD-10-CM | POA: Diagnosis not present
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
## 2017-06-10 ENCOUNTER — Other Ambulatory Visit (HOSPITAL_COMMUNITY): Payer: 59

## 2017-06-11 ENCOUNTER — Other Ambulatory Visit (HOSPITAL_COMMUNITY): Payer: 59

## 2017-06-12 ENCOUNTER — Other Ambulatory Visit (HOSPITAL_COMMUNITY): Payer: 59

## 2017-06-12 ENCOUNTER — Encounter (HOSPITAL_COMMUNITY): Payer: Self-pay | Admitting: Psychiatry

## 2017-06-12 ENCOUNTER — Ambulatory Visit (INDEPENDENT_AMBULATORY_CARE_PROVIDER_SITE_OTHER): Payer: 59 | Admitting: Psychiatry

## 2017-06-12 DIAGNOSIS — Z818 Family history of other mental and behavioral disorders: Secondary | ICD-10-CM | POA: Diagnosis not present

## 2017-06-12 DIAGNOSIS — F332 Major depressive disorder, recurrent severe without psychotic features: Secondary | ICD-10-CM

## 2017-06-12 DIAGNOSIS — Z811 Family history of alcohol abuse and dependence: Secondary | ICD-10-CM | POA: Diagnosis not present

## 2017-06-12 DIAGNOSIS — F419 Anxiety disorder, unspecified: Secondary | ICD-10-CM | POA: Diagnosis not present

## 2017-06-12 DIAGNOSIS — Z87891 Personal history of nicotine dependence: Secondary | ICD-10-CM

## 2017-06-12 DIAGNOSIS — F410 Panic disorder [episodic paroxysmal anxiety] without agoraphobia: Secondary | ICD-10-CM | POA: Diagnosis not present

## 2017-06-12 DIAGNOSIS — Z634 Disappearance and death of family member: Secondary | ICD-10-CM

## 2017-06-12 MED ORDER — DULOXETINE HCL 60 MG PO CPEP: 60.0000 mg | ORAL_CAPSULE | Freq: Two times a day (BID) | ORAL | 0 refills | Status: DC

## 2017-06-12 MED ORDER — QUETIAPINE FUMARATE 50 MG PO TABS: 150.0000 mg | ORAL_TABLET | Freq: Every day | ORAL | 2 refills | Status: DC
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
## 2017-06-15 ENCOUNTER — Other Ambulatory Visit (HOSPITAL_COMMUNITY): Payer: 59

## 2017-06-16 ENCOUNTER — Other Ambulatory Visit (HOSPITAL_COMMUNITY): Payer: 59

## 2017-06-17 ENCOUNTER — Other Ambulatory Visit (HOSPITAL_COMMUNITY): Payer: 59

## 2017-06-23 ENCOUNTER — Ambulatory Visit (HOSPITAL_COMMUNITY): Payer: Self-pay | Admitting: Psychology

## 2017-07-07 ENCOUNTER — Ambulatory Visit (INDEPENDENT_AMBULATORY_CARE_PROVIDER_SITE_OTHER): Payer: 59 | Admitting: Psychology

## 2017-07-07 DIAGNOSIS — F331 Major depressive disorder, recurrent, moderate: Secondary | ICD-10-CM | POA: Diagnosis not present

## 2017-07-07 NOTE — Progress Notes (Signed)
   THERAPIST PROGRESS NOTE  Session Time: 11am-11.48am  Participation Level: Active  Behavioral Response: Well GroomedAlertaffect wnl  Type of Therapy: Individual Therapy  Treatment Goals addressed: Diagnosis: MDd and goal.1  Interventions: CBT and Supportive  Summary: Brittany [name] is a 52 y.o. female who presents with affect wnl.  Pt reports she is continuing to do well managing mood.  Pt reports no SI.  Pt reported that she went to [place] to be w/ mom for dad's anniversary of death.  Pt did express anger felt towards sister for not following through on plans to go out to dinner w/ mom following visit to [place].  Pt was able to express feelings to mom but didn't w/ sister as sister not receptive to and would lead to increased escalation on her part.  Pt discussed that in some ways festering and being aware of this and how to release.  Pt also discussed that this is consistent w/ sister and not to place her own values on sister.  Pt reports that she is deciding to switch online dating- as aware that free site attracts a lot of things not beneficial for her.  Pt aware of distortions of thinking patterns of self worth and has been able to reframe.  Pt discussed coping skills using w/ journaling, walking, reframing.  Pt identified continued progress making. Suicidal/Homicidal: Nowithout intent/plan  Therapist Response: Assessed pt current functioning per pt report. Processed w/pt feelings towards sister and exploring ways of releasing and letting go not to fester.  Explored w/pt self care and coping skills.  Discussed decisions w/ online dating and how moving forward in positive way.  Plan: Return again in 2 weeks.  Diagnosis: MDD, moderate   Aleina Burgio, LPC 07/07/2017

## 2017-07-21 ENCOUNTER — Ambulatory Visit (INDEPENDENT_AMBULATORY_CARE_PROVIDER_SITE_OTHER): Payer: 59 | Admitting: Psychology

## 2017-07-21 DIAGNOSIS — F331 Major depressive disorder, recurrent, moderate: Secondary | ICD-10-CM | POA: Diagnosis not present

## 2017-07-21 NOTE — Progress Notes (Signed)
   THERAPIST PROGRESS NOTE  Session Time: 11am-11.45am  Participation Level: Active  Behavioral Response: Well GroomedAlertaffect wnl  Type of Therapy: Individual Therapy  Treatment Goals addressed: Diagnosis: MDD and goal 1.  Interventions: CBT and Supportive  Summary: Brittany [name] is a 52 y.o. female who presents with affect wnl.  Pt reported that she has been doing well.  Pt reported that she has been maintaining w/ mood.  Pt reported that couple incident of feeling overwhelmed w/ workload but was also able to assert w/ supervisor re: load and expectations.  Pt reported that she has been continuing to date online and recognizing some sites to step away from.  Pt reported that she has dealt w/ challenges at times w/ this but also positives. Pt was able to recognize that guy connecting w/ wanting different things and need to set boundary as wouldn't be healthy for her to continuing contact.  Pt reported she is getting out of house and has enjoyed interactions even when not a love match.  Pt discussed awareness of when negative self talk creeps in and able to reframe. Pt reported on other things that have been positive in her life and focus on engaging w/ friends..   Suicidal/Homicidal: Nowithout intent/plan  Therapist Response: Assessed pt current functioning per pt report. Processed w/pt coping w/ interactions- disappointments and awareness of self talk and reframing negative messages.  Reflected pt engaging w/ things, use of coping skills, effort and give self credit.  Discussed engaging w/ friendships.  Plan: Return again in 2 weeks.  Diagnosis: MDD   [name], [facility] 07/21/2017

## 2017-07-22 ENCOUNTER — Telehealth (HOSPITAL_COMMUNITY): Payer: Self-pay

## 2017-07-22 NOTE — Telephone Encounter (Signed)
Patient is requesting a refill on her Lorazepam 0.5mg . Please review and advise, thank you

## 2017-07-23 ENCOUNTER — Other Ambulatory Visit (HOSPITAL_COMMUNITY): Payer: Self-pay | Admitting: Psychiatry

## 2017-07-23 NOTE — Telephone Encounter (Signed)
She can get refill 0.5 mg Ativan #  20 to take as needed for severe panic attacks.

## 2017-07-24 MED ORDER — LORAZEPAM 0.5 MG PO TABS: 0.5000 mg | ORAL_TABLET | Freq: Three times a day (TID) | ORAL | 0 refills | Status: DC | PRN

## 2017-08-04 ENCOUNTER — Ambulatory Visit (INDEPENDENT_AMBULATORY_CARE_PROVIDER_SITE_OTHER): Payer: 59 | Admitting: Psychology

## 2017-08-04 DIAGNOSIS — F331 Major depressive disorder, recurrent, moderate: Secondary | ICD-10-CM | POA: Diagnosis not present

## 2017-08-04 NOTE — Progress Notes (Signed)
   THERAPIST PROGRESS NOTE  Session Time: 11am-11.45am  Participation Level: Active  Behavioral Response: Well GroomedAlertStressed  Type of Therapy: Individual Therapy  Treatment Goals addressed: Diagnosis: MDD and goal 1.  Interventions: CBT and Supportive  Summary: Brittany [name] is a 52 y.o. female who presents with affect wnl.  Pt reported she has been stressed over the past several days and a lot of stress coming from work- extra work given and awareness of own expectations creating stress.  Pt reported that she is communicating this to her supervisor and has taken opportunities to set boundaries not to take on other things not her responsibility.  Pt also reported stress from online dating- had been communicating w/ man over past 1.5 weeks and had 2 dates- agreed for another and then no response for 2 days.  Pt agreed that she may need a break from the online dating and that gets her thinking negative patterns when no response- pt is able to reframe.  Pt identifies the benefit of having social interaction and considers other ways to seek besides dating currently.  Pt also increased awareness of needing minibreaks at work as will maybe be a several hours at work before getting up from chair.    Suicidal/Homicidal: Nowithout intent/plan  Therapist Response: Assessed pt current functioning per pt report. Processed w/pt stress and stress management.  Assisted in reframing expectations for self- giving self permission for breaks and potential of break from online dating while finding other ways to have social interaction.  discussed taking mini breaks at work and making a priority to manage the increased work stress.   Plan: Return again in 2 weeks.  Diagnosis: MDD   [name], [facility] 08/04/2017

## 2017-08-20 ENCOUNTER — Ambulatory Visit (INDEPENDENT_AMBULATORY_CARE_PROVIDER_SITE_OTHER): Payer: 59 | Admitting: Psychology

## 2017-08-20 DIAGNOSIS — F331 Major depressive disorder, recurrent, moderate: Secondary | ICD-10-CM | POA: Diagnosis not present

## 2017-08-20 NOTE — Progress Notes (Signed)
   THERAPIST PROGRESS NOTE  Session Time: 9.05am-9.53am  Participation Level: Active  Behavioral Response: Well GroomedAlertaffect wnl  Type of Therapy: Individual Therapy  Treatment Goals addressed: Diagnosis: MDD and goal 1.  Interventions: CBT and Supportive  Summary: Brittany [name] is a 52 y.o. female who presents with affect wnl.  Pt reports last week felt more depressed as pt continued to share was able to increase awareness of thoughts re: hurt and anger about no longer in relationship w/ daughter and close to anniversary of separation from ex.  Pt feels that daughter is punishing her for not being a good mom at times. Pt is able to acknowledge negative self talk that also repeats this. Pt is able to challenge and reframe and focus moving forward that continues to challenge this self talk. .   Suicidal/Homicidal: Nowithout intent/plan  Therapist Response: Assessed pt current functioning per pt report. Processed w/pt increased depressed mood and explored w/ pt thoughts of hurt and anger and assisted in challenging and reframing. Discussed focusing on the relationships that are supporting the positive self talk and reciprocal feelings.   Plan: Return again in 2 weeks.  Diagnosis: MDD  [name], [facility] 08/20/2017

## 2017-08-31 ENCOUNTER — Encounter (HOSPITAL_COMMUNITY): Payer: Self-pay | Admitting: Psychiatry

## 2017-08-31 ENCOUNTER — Ambulatory Visit (INDEPENDENT_AMBULATORY_CARE_PROVIDER_SITE_OTHER): Payer: 59 | Admitting: Psychiatry

## 2017-08-31 VITALS — BP 132/80 | HR 77 | Ht 70.0 in | Wt 263.0 lb

## 2017-08-31 DIAGNOSIS — Z62811 Personal history of psychological abuse in childhood: Secondary | ICD-10-CM | POA: Diagnosis not present

## 2017-08-31 DIAGNOSIS — F4310 Post-traumatic stress disorder, unspecified: Secondary | ICD-10-CM

## 2017-08-31 DIAGNOSIS — F332 Major depressive disorder, recurrent severe without psychotic features: Secondary | ICD-10-CM

## 2017-08-31 DIAGNOSIS — Z811 Family history of alcohol abuse and dependence: Secondary | ICD-10-CM

## 2017-08-31 DIAGNOSIS — Z818 Family history of other mental and behavioral disorders: Secondary | ICD-10-CM

## 2017-08-31 DIAGNOSIS — F410 Panic disorder [episodic paroxysmal anxiety] without agoraphobia: Secondary | ICD-10-CM | POA: Diagnosis not present

## 2017-08-31 DIAGNOSIS — Z62810 Personal history of physical and sexual abuse in childhood: Secondary | ICD-10-CM

## 2017-08-31 DIAGNOSIS — Z87891 Personal history of nicotine dependence: Secondary | ICD-10-CM

## 2017-08-31 MED ORDER — QUETIAPINE FUMARATE 50 MG PO TABS: 150.0000 mg | ORAL_TABLET | Freq: Every day | ORAL | 2 refills | Status: DC

## 2017-08-31 MED ORDER — DULOXETINE HCL 60 MG PO CPEP: 60.0000 mg | ORAL_CAPSULE | Freq: Two times a day (BID) | ORAL | 0 refills | Status: DC

## 2017-08-31 MED ORDER — LORAZEPAM 0.5 MG PO TABS: 0.5000 mg | ORAL_TABLET | Freq: Every day | ORAL | 0 refills | Status: DC | PRN
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
## 2017-09-02 ENCOUNTER — Ambulatory Visit (INDEPENDENT_AMBULATORY_CARE_PROVIDER_SITE_OTHER): Payer: 59 | Admitting: Psychology

## 2017-09-02 DIAGNOSIS — F331 Major depressive disorder, recurrent, moderate: Secondary | ICD-10-CM | POA: Diagnosis not present

## 2017-09-02 NOTE — Progress Notes (Signed)
   THERAPIST PROGRESS NOTE  Session Time: 9:15am-9.50am  Participation Level: Active  Behavioral Response: Well GroomedAlerttired due to cold  Type of Therapy: Individual Therapy  Treatment Goals addressed: Diagnosis: MDD and goal 1.  Interventions: CBT and Supportive  Summary: Brittany [name] is a 52 y.o. female who presents with affect wnl.  Pt does present w/ a cold and reports not sleeping well past 2 nights w/.  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  Pt reported that she had decided to take break from dating- then contacted by man- after one date- definite connection- had a second date and now agreed to exclusively date and see where things go.  Pt expressed that feels good- faster than expected- but also different connection than others.  Pt reports have been on dates and in contact but also time for selves.  Pt is struggling some w/ negative self talk that pops up "not worth this" "don't deserve this".  Pt is reframing and reports coping well through.  Pt discussed hopes recovers from cold for weekend returning home to celebrate graduations and help friend go through family home. .   Suicidal/Homicidal: Nowithout intent/plan  Therapist Response: Assessed pt current functioning per pt report. Processed w/pt coping w/ negative self talk and doubts.  Explored w/pt connection and healthy boundaries in relationship.    Plan: Return again in 2 weeks.  Diagnosis: MDD   [name], [facility] 09/02/2017

## 2017-09-11 ENCOUNTER — Ambulatory Visit (HOSPITAL_COMMUNITY): Payer: Self-pay | Admitting: Psychiatry

## 2017-09-17 ENCOUNTER — Ambulatory Visit (INDEPENDENT_AMBULATORY_CARE_PROVIDER_SITE_OTHER): Payer: 59 | Admitting: Psychology

## 2017-09-17 DIAGNOSIS — F331 Major depressive disorder, recurrent, moderate: Secondary | ICD-10-CM | POA: Diagnosis not present

## 2017-09-17 NOTE — Progress Notes (Signed)
   THERAPIST PROGRESS NOTE  Session Time: 12.30pm-1.20pm  Participation Level: Active  Behavioral Response: Well GroomedAlertaffect wnl  Type of Therapy: Individual Therapy  Treatment Goals addressed: Diagnosis: MDD and goal 1.  Interventions: CBT and Supportive  Summary: Brittany [name] is a 53 y.o. female who presents with affect wnl.  Pt reported that she continued to deal w/ cold that kept her from visiting family and was out of work several days.  Pt reports feels much better but still recovering.  Pt reported that she is still dating guy and that she is dealing w/ anxiety about feeling vunerable.  Pt reports that awareness of past pattern in dating when younger that she would protect self from getting on a emotional level.  Pt reported that she is also trying to deal w/ negative self worth when being complimented by boyfriend.  Pt recognized that this is always struggle for her to accept compliments and challenging and reframing that he can be attracted to her.  Pt is able to challenge, reframe and recognize negative self talk and recognize positives of relationship and journey.    Suicidal/Homicidal: Nowithout intent/plan  Therapist Response: Assessed pt current functioning per pt report. Processed w/pt coping w/ anxiety of new relationship- self talk and assisted in challenging and reframe.   Plan: Return again in 2 weeks.  Diagnosis: MDD    [name], [facility] 09/17/2017

## 2017-09-22 ENCOUNTER — Other Ambulatory Visit: Payer: Self-pay | Admitting: Family

## 2017-09-22 ENCOUNTER — Other Ambulatory Visit: Payer: Self-pay | Admitting: Gynecology

## 2017-09-28 ENCOUNTER — Ambulatory Visit: Payer: Self-pay | Admitting: *Deleted

## 2017-09-28 ENCOUNTER — Observation Stay (HOSPITAL_COMMUNITY)
Admission: EM | Admit: 2017-09-28 | Discharge: 2017-09-30 | Disposition: A | Discharge status: Home / Self Care | Payer: 59 | Attending: Family Medicine | Admitting: Family Medicine

## 2017-09-28 ENCOUNTER — Other Ambulatory Visit: Payer: Self-pay

## 2017-09-28 ENCOUNTER — Encounter (HOSPITAL_COMMUNITY): Payer: Self-pay | Admitting: *Deleted

## 2017-09-28 DIAGNOSIS — K210 Gastro-esophageal reflux disease with esophagitis: Secondary | ICD-10-CM | POA: Insufficient documentation

## 2017-09-28 DIAGNOSIS — Z6837 Body mass index (BMI) 37.0-37.9, adult: Secondary | ICD-10-CM | POA: Insufficient documentation

## 2017-09-28 DIAGNOSIS — E7800 Pure hypercholesterolemia, unspecified: Secondary | ICD-10-CM | POA: Insufficient documentation

## 2017-09-28 DIAGNOSIS — Z87891 Personal history of nicotine dependence: Secondary | ICD-10-CM | POA: Insufficient documentation

## 2017-09-28 DIAGNOSIS — Z934 Other artificial openings of gastrointestinal tract status: Secondary | ICD-10-CM | POA: Diagnosis not present

## 2017-09-28 DIAGNOSIS — F329 Major depressive disorder, single episode, unspecified: Secondary | ICD-10-CM | POA: Diagnosis not present

## 2017-09-28 DIAGNOSIS — K449 Diaphragmatic hernia without obstruction or gangrene: Secondary | ICD-10-CM | POA: Insufficient documentation

## 2017-09-28 DIAGNOSIS — D539 Nutritional anemia, unspecified: Secondary | ICD-10-CM | POA: Diagnosis not present

## 2017-09-28 DIAGNOSIS — I10 Essential (primary) hypertension: Secondary | ICD-10-CM | POA: Insufficient documentation

## 2017-09-28 DIAGNOSIS — K922 Gastrointestinal hemorrhage, unspecified: Secondary | ICD-10-CM | POA: Diagnosis present

## 2017-09-28 DIAGNOSIS — K283 Acute gastrojejunal ulcer without hemorrhage or perforation: Secondary | ICD-10-CM

## 2017-09-28 DIAGNOSIS — Z79899 Other long term (current) drug therapy: Secondary | ICD-10-CM | POA: Insufficient documentation

## 2017-09-28 DIAGNOSIS — K222 Esophageal obstruction: Secondary | ICD-10-CM | POA: Diagnosis not present

## 2017-09-28 DIAGNOSIS — K921 Melena: Secondary | ICD-10-CM

## 2017-09-28 LAB — COMPREHENSIVE METABOLIC PANEL
ALBUMIN: 3.3 g/dL — AB (ref 3.5–5.0)
ALK PHOS: 68 U/L (ref 38–126)
ALT: 15 U/L (ref 14–54)
AST: 12 U/L — AB (ref 15–41)
Anion gap: 6 (ref 5–15)
BUN: 18 mg/dL (ref 6–20)
CO2: 27 mmol/L (ref 22–32)
CREATININE: 0.58 mg/dL (ref 0.44–1.00)
Calcium: 8.5 mg/dL — ABNORMAL LOW (ref 8.9–10.3)
Chloride: 109 mmol/L (ref 101–111)
GFR calc Af Amer: >60 mL/min (ref >60)
GLUCOSE: 93 mg/dL (ref 65–99)
POTASSIUM: 3.7 mmol/L (ref 3.5–5.1)
Sodium: 142 mmol/L (ref 135–145)
Total Bilirubin: 0.3 mg/dL (ref 0.3–1.2)
Total Protein: 6 g/dL — ABNORMAL LOW (ref 6.5–8.1)

## 2017-09-28 LAB — I-STAT BETA HCG BLOOD, ED (MC, WL, AP ONLY): I-stat hCG, quantitative: <5 m[IU]/mL (ref <5)

## 2017-09-28 LAB — CBC
HEMATOCRIT: 23.4 % — AB (ref 36.0–46.0)
Hemoglobin: 7.7 g/dL — ABNORMAL LOW (ref 12.0–15.0)
MCH: 33.2 pg (ref 26.0–34.0)
MCHC: 32.9 g/dL (ref 30.0–36.0)
MCV: 100.9 fL — ABNORMAL HIGH (ref 78.0–100.0)
PLATELETS: 330 10*3/uL (ref 150–400)
RBC: 2.32 MIL/uL — ABNORMAL LOW (ref 3.87–5.11)
RDW: 13.2 % (ref 11.5–15.5)
WBC: 6.1 10*3/uL (ref 4.0–10.5)

## 2017-09-28 LAB — VITAMIN B12: Vitamin B-12: 1833 pg/mL — ABNORMAL HIGH (ref 180–914)

## 2017-09-28 LAB — ABO/RH: ABO/RH(D): O POS

## 2017-09-28 LAB — FOLATE: FOLATE: 30.8 ng/mL (ref >5.9)

## 2017-09-28 LAB — POC OCCULT BLOOD, ED: Fecal Occult Bld: POSITIVE — AB

## 2017-09-28 MED ORDER — ADULT MULTIVITAMIN W/MINERALS CH
1.0000 | ORAL_TABLET | Freq: Every day | ORAL | Status: DC
  Administered 2017-09-30: 1 via ORAL
  Filled 2017-09-28: qty 1

## 2017-09-28 MED ORDER — DULOXETINE HCL 60 MG PO CPEP
60.0000 mg | ORAL_CAPSULE | Freq: Two times a day (BID) | ORAL | Status: DC
  Administered 2017-09-28 – 2017-09-30 (×3): 60 mg via ORAL
  Filled 2017-09-28 (×3): qty 1

## 2017-09-28 MED ORDER — LORAZEPAM 0.5 MG PO TABS: 0.5000 mg | ORAL_TABLET | Freq: Every day | ORAL | Status: DC | PRN

## 2017-09-28 MED ORDER — VITAMIN E 180 MG (400 UNIT) PO CAPS
1000.0000 [IU] | ORAL_CAPSULE | Freq: Every day | ORAL | Status: DC
  Administered 2017-09-30: 1000 [IU] via ORAL
  Filled 2017-09-28 (×2): qty 2

## 2017-09-28 MED ORDER — SODIUM CHLORIDE 0.9 % IV SOLN
INTRAVENOUS | Status: DC
  Administered 2017-09-28 – 2017-09-29 (×2): via INTRAVENOUS

## 2017-09-28 MED ORDER — CALCIUM CARBONATE-VITAMIN D 500-200 MG-UNIT PO TABS
3.0000 | ORAL_TABLET | Freq: Every day | ORAL | Status: DC
  Administered 2017-09-30: 3 via ORAL
  Filled 2017-09-28: qty 3

## 2017-09-28 MED ORDER — FAMOTIDINE IN NACL 20-0.9 MG/50ML-% IV SOLN
20.0000 mg | Freq: Two times a day (BID) | INTRAVENOUS | Status: DC
  Administered 2017-09-28: 20 mg via INTRAVENOUS
  Filled 2017-09-28: qty 50

## 2017-09-28 MED ORDER — QUETIAPINE FUMARATE 100 MG PO TABS
150.0000 mg | ORAL_TABLET | Freq: Every day | ORAL | Status: DC
  Administered 2017-09-28 – 2017-09-29 (×2): 150 mg via ORAL
  Filled 2017-09-28 (×2): qty 1

## 2017-09-28 MED ORDER — CYANOCOBALAMIN 500 MCG PO TABS
500.0000 ug | ORAL_TABLET | Freq: Every day | ORAL | Status: DC
  Administered 2017-09-30: 500 ug via ORAL
  Filled 2017-09-28 (×2): qty 1

## 2017-09-28 NOTE — ED Notes (Signed)
Pt updated on plan of care

## 2017-09-28 NOTE — Telephone Encounter (Signed)
Routing to [facility], fyi.Marland KitchenMarland KitchenMarland Kitchen

## 2017-09-28 NOTE — H&P (Signed)
History and Physical  [name] ION:[id number] DOB: 06-Apr-1966 DOA: 09/28/2017  Referring physician: Dr Alvino Chapel  PCP: Golden Circle, FNP  Outpatient Specialists: Rudolpho Sevin Patient coming from: Home  Chief Complaint: Generalized weakness and bloody stools  HPI: Brittany [name] is a 52 y.o. 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  Denies prior history of GI bleed.  Had a colonoscopy a year ago at [facility], states it was not concerning. Admits to intermittent dizziness when she stands, nausea with no vomiting.  Denies abdominal cramping.  Family history of colon cancer in her mother at the age of 8.  ED Course: On presentation to the ED, hemoglobin 7.7 from baseline hemoglobin 12.2 a year ago.  FOBT positive.  1 unit PRBC ordered by ED physician for transfusion.  GI consulted by ED.  Admitted for acute GI bleed and symptomatic anemia.  Review of Systems: Review of systems as stated in the HPI.  All other systems reviewed and are negative.   Past Medical History:  Diagnosis Date  . Allergy   . Anxiety   . Arthritis    hips, knees and hands   . Depression   . GERD (gastroesophageal reflux disease)    hx of   . H/O hiatal hernia   . History of chicken pox   . Hypercholesterolemia   . Hypertension   . Migraine   . Migraines   . Morbid obesity ([place])   . Positive TB test   . PTSD (post-traumatic stress disorder)   . Sleep apnea    no CPAP machine    Past Surgical History:  Procedure Laterality Date  . ABDOMINAL HYSTERECTOMY  1997 & 2006  . BREATH TEK H PYLORI  05/03/2012   Procedure: BREATH TEK H PYLORI;  Surgeon: [name], MD;  Location: [name];  Service: General;  Laterality: N/A;  . COLONOSCOPY     hx  of benign polyps   . FOOT SURGERY    . GASTRIC BYPASS    . GASTRIC ROUX-EN-Y N/A 07/05/2012   Procedure: LAPAROSCOPIC ROUX-EN-Y GASTRIC;  Surgeon: [name], MD;  Location: [facility];  Service: General;  Laterality: N/A;  . IRRIGATION AND DEBRIDEMENT ABSCESS N/A 01/01/2014   Procedure: IRRIGATION AND DEBRIDEMENT ABSCESS;  Surgeon: [name], MD;  Location: [facility];  Service: General;  Laterality: N/A;  . LAPAROSCOPIC LYSIS OF ADHESIONS N/A 07/05/2012   Procedure: LAPAROSCOPIC LYSIS OF ADHESIONS;  Surgeon: [name], MD;  [facility];  Service: General;  Laterality: N/A;  repair of abdominal wall hernia  . right tube and ovary removed     . UPPER GI ENDOSCOPY N/A 07/05/2012   Procedure: UPPER GI ENDOSCOPY;  Surgeon: [name], MD;  Location[facility];  Service: General;  Laterality: N/A;    Social History:  reports that she quit smoking about 13 years ago. Her smoking use included cigarettes. She has a 0.10 pack-year smoking history. She has never used smokeless tobacco. She reports that she does not drink alcohol or use drugs.   Allergies  Allergen Reactions  . Contrast Media [Iodinated Diagnostic Agents] Hives  . Amoxicillin Hives    .Marland KitchenHas patient had a PCN reaction causing immediate rash, facial/tongue/throat swelling, SOB or lightheadedness with hypotension: Yes Has patient had  a PCN reaction causing severe rash involving mucus membranes or skin necrosis: No Has patient had a PCN reaction that required hospitalization No Has patient had a PCN reaction occurring within the last 10 years: No If all of the above answers are "NO", then may proceed with Cephalosporin use.   . Bactrim [Sulfamethoxazole-Trimethoprim] Hives and Rash  . Lamictal [Lamotrigine] Hives and Rash    Family History  Problem Relation Age of Onset  . Colon cancer Mother 48  . Depression Father   . Diabetes Father   . Heart disease Father   . Bladder Cancer Father   . Depression Sister   .  Post-traumatic stress disorder Sister   . Alcohol abuse Paternal Uncle   . Bipolar disorder Cousin   . Anxiety disorder Sister   . Cancer Maternal Aunt        breast  . Melanoma Maternal Grandmother   . Colon cancer Maternal Grandmother        does not know age of onset  . Healthy Maternal Grandfather   . Healthy Paternal Grandmother   . Heart disease Paternal Grandfather   . Stroke Paternal Grandfather   . Diabetes Paternal Grandfather      Prior to Admission medications   Medication Sig Start Date End Date Taking? Authorizing Provider  calcium-vitamin D (OSCAL WITH D) 500-200 MG-UNIT tablet Take 3 tablets by mouth every morning. For bone health 09/17/15  Yes Lindell Spar I, NP  cholecalciferol (VITAMIN D) 1000 units tablet Take 1 tablet (1,000 Units total) by mouth daily. For bone health 09/17/15  Yes Lindell Spar I, NP  DULoxetine (CYMBALTA) 60 MG capsule Take 1 capsule (60 mg total) by mouth 2 (two) times daily. 08/31/17  Yes Arfeen, Arlyce Harman, MD  IRON PO Take 1 tablet by mouth daily.   Yes [provider]  loratadine (CLARITIN) 10 MG tablet Take 10 mg by mouth daily.   Yes [provider]  LORazepam (ATIVAN) 0.5 MG tablet Take 1 tablet (0.5 mg total) by mouth daily as needed for anxiety. 08/31/17 08/31/18 Yes Arfeen, Arlyce Harman, MD  Multiple Vitamins-Minerals (MULTIVITAMIN WITH MINERALS) tablet Take 1 tablet by mouth daily. For low vitamin 09/17/15  Yes Nwoko, Herbert Pun I, NP  QUEtiapine (SEROQUEL) 50 MG tablet Take 3 tablets (150 mg total) by mouth at bedtime. 08/31/17  Yes Arfeen, Arlyce Harman, MD  vitamin B-12 (CYANOCOBALAMIN) 500 MCG tablet Take 1 tablet (500 mcg total) by mouth daily. For Vitamin B-12 replacement 09/17/15  Yes Lindell Spar I, NP  vitamin E 1000 UNIT capsule Take 1,000 Units by mouth daily.   Yes [provider]  Ibuprofen-Famotidine 800-26.6 MG TABS Take 1 tablet (800 mg) every 8 hours as needed for pain. Patient not taking: Reported on 09/28/2017 07/23/16    Golden Circle, FNP  loratadine (CLARITIN) 10 MG tablet Take 1 tablet (10 mg total) by mouth daily. For allergies Patient not taking: Reported on 09/28/2017 09/17/15   Lindell Spar I, NP    Physical Exam: BP (!) 133/50   Pulse 72   Temp 98.1 F (36.7 C) (Oral)   Resp 15   SpO2 97%   . General: 52 y.o. year-old female well developed well nourished in no acute distress.  Alert and oriented x3. . Cardiovascular: Regular rate and rhythm with no rubs or gallops.  No thyromegaly or JVD noted.   Marland Kitchen Respiratory: Clear to auscultation with no wheezes or rales. Good inspiratory effort. . Abdomen: Soft nontender nondistended with normal  bowel sounds x4 quadrants. Scars in mid abdomen from previous abdominal surgery with mild clear drainage. . Musculoskeletal: No lower extremity edema. 2/4 pulses in all 4 extremities. . Skin: No ulcerative lesions noted or rashes. Scars in mid abdomen from previous abdominal surgery with mild clear drainage. Marland Kitchen Psychiatry: Mood is appropriate for condition and setting          Labs on Admission:  Basic Metabolic Panel: Recent Labs  Lab 09/28/17 1514  NA 142  K 3.7  CL 109  CO2 27  GLUCOSE 93  BUN 18  CREATININE 0.58  CALCIUM 8.5*   Liver Function Tests: Recent Labs  Lab 09/28/17 1514  AST 12*  ALT 15  ALKPHOS 68  BILITOT 0.3  PROT 6.0*  ALBUMIN 3.3*   No results for input(s): LIPASE, AMYLASE in the last 168 hours. No results for input(s): AMMONIA in the last 168 hours. CBC: Recent Labs  Lab 09/28/17 1514  WBC 6.1  HGB 7.7*  HCT 23.4*  MCV 100.9*  PLT 330   Cardiac Enzymes: No results for input(s): CKTOTAL, CKMB, CKMBINDEX, TROPONINI in the last 168 hours.  BNP (last 3 results) No results for input(s): BNP in the last 8760 hours.  ProBNP (last 3 results) No results for input(s): PROBNP in the last 8760 hours.  CBG: No results for input(s): GLUCAP in the last 168 hours.  Radiological Exams on Admission: No results  found.  EKG: Independently reviewed. Personally reviewed and revealed sinus rhythm.  Assessment/Plan Present on Admission: . Acute GI bleeding  Active Problems:   Acute GI bleeding   Acute GI bleed Self-reported bloody stools 3 days ago Last bowel movement with melena early today FOBT Positive in the ED Hemoglobin 7.7 from 12.2 at baseline a year ago 1 unit PRBC ordered for transfusion by ED physician Repeat CBC in the morning GI consulted by ED physician Start IV Protonix 40 mg twice daily Start gentle IV hydration Avoid NSAIDs Monitor blood pressure to maintain map greater than 65  Symptomatic anemia Suspect secondary to acute GI bleed versus others Hemoglobin 7.7 MCV of 100.9 Continue home iron supplement Obtain B12 and folate levels  History of Roux-en-Y gastric bypass, 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   DVT prophylaxis: SCDs due to suspected active GI bleed  Code Status: Full code  Family Communication: None at bed side  Disposition Plan: Admit to the telemetry unit  Consults called: GI consulted by ED physician  Admission status: InPatient    Kayleen Memos MD [facility] Pager [phone]  If 7PM-7AM, please contact night-coverage [email or web address] Password [email or web address]  09/28/2017, 6:00 PM

## 2017-09-28 NOTE — ED Provider Notes (Addendum)
[place] DEPT Provider Note   CSN: [id number] Arrival date & time: 09/28/17  1317     History   Chief Complaint Chief Complaint  Patient presents with  . Rectal Bleeding  . Dizziness    HPI Brittany [name] is a 52 y.o. female.  HPI Patient presents with blood in the stool dizziness lightheadedness.  States for the last month she is felt weak after a cold.  Thought it was just a cold.  However 3 days ago had some blood in the stool.  States it was is red and thought that it was from the kimchi she ate.  States now the stool is black her.  States she is felt more lightheaded has difficulty standing.  No other bleeding.  Dull epigastric pain.  Previous intra-abdominal abscess after bowel obstruction.  Also previous Roux-en-Y gastric bypass.  Has not been on NSAIDs recently.  Has seen the [facility] gastroenterology in the past. Past Medical History:  Diagnosis Date  . Allergy   . Anxiety   . Arthritis    hips, knees and hands   . Depression   . GERD (gastroesophageal reflux disease)    hx of   . H/O hiatal hernia   . History of chicken pox   . Hypercholesterolemia   . Hypertension   . Migraine   . Migraines   . Morbid obesity ([employer])   . Positive TB test   . PTSD (post-traumatic stress disorder)   . Sleep apnea    no CPAP machine     Patient Active Problem List   Diagnosis Date Noted  . Epigastric abdominal pain 11/19/2016  . Incisional hernia with obstruction but no gangrene 07/21/2016  . Menopause 04/22/2016  . Routine general medical examination at a health care facility 03/18/2016  . Obesity 03/18/2016  . Severe episode of recurrent major depressive disorder, without psychotic features ([place])   . Depression, major, severe recurrence ([place]) 09/11/2015  . Major depressive disorder, recurrent, severe without psychotic features ([place])   . MDD (major depressive disorder) 10/30/2014  . MDD (major depressive disorder), recurrent severe, without  psychosis ([place])   . Suicidal ideation 10/05/2014  . Depression 06/12/2014  . Rash 01/04/2014  . Normocytic anemia 01/04/2014  . DJD (degenerative joint disease) 01/04/2014  . Hypertension 01/04/2014  . Dyslipidemia 01/04/2014  . GERD (gastroesophageal reflux disease) 01/04/2014  . Obstructive sleep apnea 01/04/2014  . Status post small bowel resection 01/04/2014  . Abdominal abscess 01/01/2014  . Wound infection after surgery 01/01/2014  . PTSD (post-traumatic stress disorder) 11/23/2013  . Severe recurrent major depression without psychotic features ([place]) 11/22/2013  . 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 Morbid obesity, Weight - 312, BMI - 45.2 04/21/2012    Past Surgical History:  Procedure Laterality Date  . ABDOMINAL HYSTERECTOMY  1997 & 2006  . BREATH TEK H PYLORI  05/03/2012   Procedure: BREATH TEK H PYLORI;  Surgeon: [name], MD;  Location: [name];  Service: General;  Laterality: N/A;  . COLONOSCOPY     hx of benign polyps   . FOOT SURGERY    . GASTRIC BYPASS    . GASTRIC ROUX-EN-Y N/A 07/05/2012   Procedure: LAPAROSCOPIC ROUX-EN-Y GASTRIC;  Surgeon: [name], MD;  Location: [facility];  Service: General;  Laterality: N/A;  . IRRIGATION AND DEBRIDEMENT ABSCESS N/A 01/01/2014   Procedure: IRRIGATION AND DEBRIDEMENT ABSCESS;  Surgeon: [name], MD;  [facility];  Service: General;  Laterality: N/A;  . LAPAROSCOPIC LYSIS OF ADHESIONS N/A 07/05/2012   Procedure: LAPAROSCOPIC LYSIS OF ADHESIONS;  Surgeon: [name], MD;  Location: [facility];  Service: General;  Laterality: N/A;  repair of abdominal wall hernia  . right tube and ovary removed     . UPPER GI ENDOSCOPY N/A 07/05/2012   Procedure: UPPER GI ENDOSCOPY;  Surgeon: [name], MD;  Location: [facility];  Service: General;  Laterality: N/A;     OB History    Gravida  0   Para  0   Term  0   Preterm  0   AB  0   Living  0     SAB  0   TAB  0   Ectopic   0   Multiple  0   Live Births  0            Home Medications    Prior to Admission medications   Medication Sig Start Date End Date Taking? Authorizing Provider  calcium-vitamin D (OSCAL WITH D) 500-200 MG-UNIT tablet Take 3 tablets by mouth every morning. For bone health 09/17/15  Yes Lindell Spar I, NP  cholecalciferol (VITAMIN D) 1000 units tablet Take 1 tablet (1,000 Units total) by mouth daily. For bone health 09/17/15  Yes Lindell Spar I, NP  DULoxetine (CYMBALTA) 60 MG capsule Take 1 capsule (60 mg total) by mouth 2 (two) times daily. 08/31/17  Yes Arfeen, Arlyce Harman, MD  IRON PO Take 1 tablet by mouth daily.   Yes [provider]  loratadine (CLARITIN) 10 MG tablet Take 10 mg by mouth daily.   Yes [provider]  LORazepam (ATIVAN) 0.5 MG tablet Take 1 tablet (0.5 mg total) by mouth daily as needed for anxiety. 08/31/17 08/31/18 Yes Arfeen, Arlyce Harman, MD  Multiple Vitamins-Minerals (MULTIVITAMIN WITH MINERALS) tablet Take 1 tablet by mouth daily. For low vitamin 09/17/15  Yes Nwoko, Herbert Pun I, NP  QUEtiapine (SEROQUEL) 50 MG tablet Take 3 tablets (150 mg total) by mouth at bedtime. 08/31/17  Yes Arfeen, Arlyce Harman, MD  vitamin B-12 (CYANOCOBALAMIN) 500 MCG tablet Take 1 tablet (500 mcg total) by mouth daily. For Vitamin B-12 replacement 09/17/15  Yes Lindell Spar I, NP  vitamin E 1000 UNIT capsule Take 1,000 Units by mouth daily.   Yes [provider]  Ibuprofen-Famotidine 800-26.6 MG TABS Take 1 tablet (800 mg) every 8 hours as needed for pain. Patient not taking: Reported on 09/28/2017 07/23/16   Golden Circle, FNP  loratadine (CLARITIN) 10 MG tablet Take 1 tablet (10 mg total) by mouth daily. For allergies Patient not taking: Reported on 09/28/2017 09/17/15   Encarnacion Slates, NP    Family History Family History  Problem Relation Age of Onset  . Colon cancer Mother 61  . Depression Father   . Diabetes Father   . Heart disease Father   . Bladder Cancer Father   .  Depression Sister   . Post-traumatic stress disorder Sister   . Alcohol abuse Paternal Uncle   . Bipolar disorder Cousin   . Anxiety disorder Sister   . Cancer Maternal Aunt        breast  . Melanoma Maternal Grandmother   . Colon cancer Maternal Grandmother        does not know age of onset  . Healthy Maternal Grandfather   . Healthy Paternal Grandmother   . Heart disease Paternal Grandfather   . Stroke Paternal Grandfather   .  Diabetes Paternal Grandfather     Social History Social History   Tobacco Use  . Smoking status: Former Smoker    Packs/day: 0.10    Years: 1.00    Pack years: 0.10    Types: Cigarettes    Last attempt to quit: 04/27/2004    Years since quitting: 13.4  . Smokeless tobacco: Never Used  . Tobacco comment: Smokes one at times.   Substance Use Topics  . Alcohol use: No    Alcohol/week: 0.0 oz    Comment: Occasional use/ 2 drinks a month  . Drug use: No     Allergies   Contrast media [iodinated diagnostic agents]; Amoxicillin; Bactrim [sulfamethoxazole-trimethoprim]; and Lamictal [lamotrigine]   Review of Systems Review of Systems  Constitutional: Negative for appetite change, fatigue and unexpected weight change.  HENT: Negative for congestion.   Respiratory: Negative for shortness of breath.   Cardiovascular: Negative for chest pain.  Gastrointestinal: Positive for abdominal pain and blood in stool.  Genitourinary: Negative for flank pain.  Musculoskeletal: Negative for back pain.  Skin: Negative for rash.  Neurological: Positive for light-headedness.  Hematological: Does not bruise/bleed easily.  Psychiatric/Behavioral: Negative for behavioral problems.     Physical Exam Updated Vital Signs BP 129/68   Pulse 77   Temp 98.1 F (36.7 C) (Oral)   Resp 15   SpO2 100%   Physical Exam  Constitutional: She appears well-developed.  HENT:  Head: Normocephalic.  Eyes: Pupils are equal, round, and reactive to light.  Neck: Neck  supple.  Cardiovascular: Normal rate.  Pulmonary/Chest: Effort normal.  Abdominal: Soft.  Mild epigastric tenderness without rebound or guarding.  Musculoskeletal: She exhibits no tenderness.  Neurological: She is alert.  Skin: Skin is warm. Capillary refill takes less than 2 seconds.     ED Treatments / Results  Labs (all labs ordered are listed, but only abnormal results are displayed) Labs Reviewed  COMPREHENSIVE METABOLIC PANEL - Abnormal; Notable for the following components:      Result Value   Calcium 8.5 (*)    Total Protein 6.0 (*)    Albumin 3.3 (*)    AST 12 (*)    All other components within normal limits  CBC - Abnormal; Notable for the following components:   RBC 2.32 (*)    Hemoglobin 7.7 (*)    HCT 23.4 (*)    MCV 100.9 (*)    All other components within normal limits  POC OCCULT BLOOD, ED - Abnormal; Notable for the following components:   Fecal Occult Bld POSITIVE (*)    All other components within normal limits  VITAMIN B12  FOLATE  I-STAT BETA HCG BLOOD, ED ([facility], [facility], [facility] ONLY)  TYPE AND SCREEN  ABO/RH    EKG None  Radiology No results found.  Procedures Procedures (including critical care time)  Medications Ordered in ED Medications - No data to display   Initial Impression / Assessment and Plan / ED Course  I have reviewed the triage vital signs and the nursing notes.  Pertinent labs & imaging results that were available during my care of the patient were reviewed by me and considered in my medical decision making (see chart for details).      Patient presents with GI bleeding.  Initially started as red blood now more melanotic.  Hemoglobin is now 7.7 and patient symptomatic with having difficulty walking.  States she is basically stayed in bed because of it.  Has seen the bowel or GI previously  and I have discussed with them and I will see the patient in consult.  B12 and folate levels added due to her macrocytic anemia.  Will admit to  internal medicine.  CRITICAL CARE Performed by: Davonna Belling Total critical care time: 30 minutes Critical care time was exclusive of separately billable procedures and treating other patients. Critical care was necessary to treat or prevent imminent or life-threatening deterioration. Critical care was time spent personally by me on the following activities: development of treatment plan with patient and/or surrogate as well as nursing, discussions with consultants, evaluation of patient's response to treatment, examination of patient, obtaining history from patient or surrogate, ordering and performing treatments and interventions, ordering and review of laboratory studies, ordering and review of radiographic studies, pulse oximetry and re-evaluation of patient's condition.  Final Clinical Impressions(s) / ED Diagnoses   Final diagnoses:  Gastrointestinal hemorrhage, unspecified gastrointestinal hemorrhage type  Macrocytic anemia    ED Discharge Orders    None       Davonna Belling, MD 09/28/17 1657    Davonna Belling, MD 10/09/17 1740

## 2017-09-28 NOTE — ED Triage Notes (Signed)
Pt complains of blood in stool, dizziness, weakness x 4 days. Pt states she has felt weak for the past month since having a cold. Pt denies pain.

## 2017-09-28 NOTE — ED Notes (Signed)
ED TO INPATIENT HANDOFF REPORT  Name/Age/Gender Brittany [name] 52 y.o. female  Code Status    Code Status Orders  (From admission, onward)        Start     Ordered   09/28/17 1835  Full code  Continuous     09/28/17 1834    Code Status History    Date Active Date Inactive Code Status Order ID Comments User Context   09/11/2015 1529 09/18/2015 0000 Full Code [id number]  Patrecia Pour, NP Inpatient   09/11/2015 1303 09/11/2015 1529 Full Code [id number]  Dalia Heading, PA-C ED   11/03/2014 1533 11/08/2014 2102 Full Code [id number]  Gonzella Lex, MD Inpatient   10/30/2014 1722 11/03/2014 0250 Full Code [id number]  Encarnacion Slates, NP Inpatient   10/06/2014 1311 10/13/2014 2207 Full Code [id number]  Benjamine Mola, [place] Inpatient   10/05/2014 1750 10/06/2014 1311 Full Code [id number]  Kerrie Buffalo, NP Inpatient   06/12/2014 1916 06/15/2014 2202 Full Code [id number]  Kerrie Buffalo May, NP Inpatient   01/01/2014 1846 01/09/2014 2026 Full Code [id number]  [name], MD Inpatient   11/22/2013 1959 11/30/2013 1921 Full Code [id number]  Clarene Reamer, MD Inpatient   11/22/2013 1405 11/22/2013 1959 Full Code [id number]  [place], Gwenlyn Perking ED      Home/SNF/Other Home  Chief Complaint weak,dizzy, blood in stool  Level of Care/Admitting Diagnosis ED Disposition    ED Disposition Condition [place]: [place] [100102]  Level of Care: Telemetry [5]  Admit to tele based on following criteria: Monitor for Ischemic changes  Diagnosis: Acute GI bleeding [253168]  Admitting Physician: Kayleen Memos [[id number]]  Attending Physician: Kayleen Memos [[id number]]  Estimated length of stay: past midnight tomorrow  Certification:: I certify this patient will need inpatient services for at least 2 midnights  PT Class (Do Not Modify): Inpatient [101]  PT Acc Code (Do Not Modify): Private [1]       Medical History Past Medical History:  Diagnosis  Date  . Allergy   . Anxiety   . Arthritis    hips, knees and hands   . Depression   . GERD (gastroesophageal reflux disease)    hx of   . H/O hiatal hernia   . History of chicken pox   . Hypercholesterolemia   . Hypertension   . Migraine   . Migraines   . Morbid obesity ([place])   . Positive TB test   . PTSD (post-traumatic stress disorder)   . Sleep apnea    no CPAP machine     Allergies Allergies  Allergen Reactions  . Contrast Media [Iodinated Diagnostic Agents] Hives  . Amoxicillin Hives    .Marland KitchenHas patient had a PCN reaction causing immediate rash, facial/tongue/throat swelling, SOB or lightheadedness with hypotension: Yes Has patient had a PCN reaction causing severe rash involving mucus membranes or skin necrosis: No Has patient had a PCN reaction that required hospitalization No Has patient had a PCN reaction occurring within the last 10 years: No If all of the above answers are "NO", then may proceed with Cephalosporin use.   . Bactrim [Sulfamethoxazole-Trimethoprim] Hives and Rash  . Lamictal [Lamotrigine] Hives and Rash    IV Location/Drains/Wounds Patient Lines/Drains/Airways Status   Active Line/Drains/Airways    Name:   Placement date:   Placement time:   Site:   Days:   Peripheral IV 07/30/15 Left Hand   07/30/15    0855  Hand   791   Peripheral IV 09/28/17 Right Forearm   09/28/17    1539    Forearm   less than 1   Urethral Catheter Chauncey Reading. Twine Latex   11/17/14    1015    Latex   1046   Airway   11/17/14    1015     1046   Airway 7.5 mm   11/17/14    1015     1046   Airway   06/18/15    1025     833   Incision (Closed) 01/01/14 Abdomen Mid;Lower   01/01/14    1724     1366          Labs/Imaging Results for orders placed or performed during the hospital encounter of 09/28/17 (from the past 48 hour(s))  Comprehensive metabolic panel     Status: Abnormal   Collection Time: 09/28/17  3:14 PM  Result Value Ref Range   Sodium 142 135 - 145 mmol/L    Potassium 3.7 3.5 - 5.1 mmol/L   Chloride 109 101 - 111 mmol/L   CO2 27 22 - 32 mmol/L   Glucose, Bld 93 65 - 99 mg/dL   BUN 18 6 - 20 mg/dL   Creatinine, Ser 0.58 0.44 - 1.00 mg/dL   Calcium 8.5 (L) 8.9 - 10.3 mg/dL   Total Protein 6.0 (L) 6.5 - 8.1 g/dL   Albumin 3.3 (L) 3.5 - 5.0 g/dL   AST 12 (L) 15 - 41 U/L   ALT 15 14 - 54 U/L   Alkaline Phosphatase 68 38 - 126 U/L   Total Bilirubin 0.3 0.3 - 1.2 mg/dL   GFR calc non Af Amer >60 >60 mL/min   GFR calc Af Amer >60 >60 mL/min    Comment: (NOTE) The eGFR has been calculated using the CKD EPI equation. This calculation has not been validated in all clinical situations. eGFR's persistently <60 mL/min signify possible Chronic Kidney Disease.    Anion gap 6 5 - 15    Comment: Performed at [facility], [place] [address], [place], [place] [address]  CBC     Status: Abnormal   Collection Time: 09/28/17  3:14 PM  Result Value Ref Range   WBC 6.1 4.0 - 10.5 K/uL   RBC 2.32 (L) 3.87 - 5.11 MIL/uL   Hemoglobin 7.7 (L) 12.0 - 15.0 g/dL   HCT 23.4 (L) 36.0 - 46.0 %   MCV 100.9 (H) 78.0 - 100.0 fL   MCH 33.2 26.0 - 34.0 pg   MCHC 32.9 30.0 - 36.0 g/dL   RDW 13.2 11.5 - 15.5 %   Platelets 330 150 - 400 K/uL    Comment: Performed at [facility], [place] [address], [place], [place] [address]  Type and screen [place]     Status: None   Collection Time: 09/28/17  3:31 PM  Result Value Ref Range   ABO/RH(D) O POS    Antibody Screen NEG    Sample Expiration      10/01/2017 Performed at [facility], [place] [address], [place], [place] [address]   I-Stat beta hCG blood, ED     Status: None   Collection Time: 09/28/17  3:39 PM  Result Value Ref Range   I-stat hCG, quantitative <5.0 <5 mIU/mL   Comment 3            Comment:   GEST. AGE  CONC.  (mIU/mL)   <=1 WEEK        5 - 50     2 WEEKS       50 - 500     3 WEEKS       100 - 10,000     4 WEEKS     1,000 -  30,000        FEMALE AND NON-PREGNANT FEMALE:     LESS THAN 5 mIU/mL   POC occult blood, ED     Status: Abnormal   Collection Time: 09/28/17  4:10 PM  Result Value Ref Range   Fecal Occult Bld POSITIVE (A) NEGATIVE   No results found.  Pending Labs Unresulted Labs (From admission, onward)   Start     Ordered   09/29/17 0500  CBC  Tomorrow morning,   R     09/28/17 1836   09/29/17 8403  Basic metabolic panel  Tomorrow morning,   R     09/28/17 1836   09/29/17 0500  Magnesium  Tomorrow morning,   R     09/28/17 1836   09/29/17 0500  Folate RBC  Tomorrow morning,   R     09/28/17 1838   09/29/17 0500  Vitamin B12  Tomorrow morning,   R     09/28/17 1838   09/28/17 1641  Vitamin B12  STAT,   STAT     09/28/17 1640   09/28/17 1641  Folate  STAT,   STAT     09/28/17 1640   09/28/17 1531  ABO/Rh  Once,   R     09/28/17 1531      Vitals/Pain Today's Vitals   09/28/17 1700 09/28/17 1723 09/28/17 1759 09/28/17 1800  BP: (!) 133/50  (!) 133/50 (!) 129/52  Pulse: 80 83 72 75  Resp:   15   Temp:      TempSrc:      SpO2: 97% 100% 97% 99%    Isolation Precautions No active isolations  Medications Medications  calcium-vitamin D (OSCAL WITH D) 500-200 MG-UNIT per tablet 3 tablet (has no administration in time range)  DULoxetine (CYMBALTA) DR capsule 60 mg (has no administration in time range)  LORazepam (ATIVAN) tablet 0.5 mg (has no administration in time range)  multivitamin with minerals tablet 1 tablet (has no administration in time range)  QUEtiapine (SEROQUEL) tablet 150 mg (has no administration in time range)  vitamin B-12 (CYANOCOBALAMIN) tablet 500 mcg (has no administration in time range)  vitamin E capsule 1,000 Units (has no administration in time range)  famotidine (PEPCID) IVPB 20 mg premix (has no administration in time range)  0.9 %  sodium chloride infusion (has no administration in time range)    Mobility walks

## 2017-09-28 NOTE — Telephone Encounter (Signed)
Pt reports dizziness, onset Friday. States lightheadedness, positional, worse with standing. States "ringing in ears and everything goes black. I lie down before I pass out." States has to hold onto wall to ambulate. Last episode this am. Also reports visible bright red blood in stool Friday. Stools have been "Dark, tarry" multiple times over weekend, with each BM. Pt states she takes Fe daily but this is new onset.  Pt also reports constant "Pounding headache." Denies any abdominal pain. States had cold/cough x 1 month but is mostly resolved. States is staying hydrated. Reports increasing weakness x 1 month; heart "racing" at times, presently.  Pt directed to ED. States will follow disposition, friend to drive. Please note pt has CPE with Mindi Slicker scheduled for tomorrow.  Reason for Disposition . SEVERE dizziness (e.g., unable to stand, requires support to walk, feels like passing out now)  Answer Assessment - Initial Assessment Questions 1. DESCRIPTION: "Describe your dizziness."     Lightheadedness 2. LIGHTHEADED: "Do you feel lightheaded?" (e.g., somewhat faint, woozy, weak upon standing)     yes 3. VERTIGO: "Do you feel like either you or the room is spinning or tilting?" (i.e. vertigo)     No 4. SEVERITY: "How bad is it?"  "Do you feel like you are going to faint?" "Can you stand and walk?"   - MILD - walking normally   - MODERATE - interferes with normal activities (e.g., work, school)    - SEVERE - unable to stand, requires support to walk, feels like passing out now.      severe 5. ONSET:  "When did the dizziness begin?"     Friday 6. AGGRAVATING FACTORS: "Does anything make it worse?" (e.g., standing, change in head position)     Standing 7. HEART RATE: "Can you tell me your heart rate?" "How many beats in 15 seconds?"  (Note: not all patients can do this)       Racing 8. CAUSE: "What do you think is causing the dizziness?"     Unsure 9. RECURRENT SYMPTOM: "Have you had dizziness  before?" If so, ask: "When was the last time?" "What happened that time?"     no 10. OTHER SYMPTOMS: "Do you have any other symptoms?" (e.g., fever, chest pain, vomiting, diarrhea, bleeding)      Bright red blood in stool Friday; dark tarry stools over weekend and this AM, ringing in ears "things go black like I've passed out." Pounding headache  Protocols used: DIZZINESS [facility]

## 2017-09-29 ENCOUNTER — Inpatient Hospital Stay (HOSPITAL_COMMUNITY): Payer: 59 | Admitting: Certified Registered Nurse Anesthetist

## 2017-09-29 ENCOUNTER — Encounter: Payer: 59 | Admitting: Family

## 2017-09-29 ENCOUNTER — Encounter (HOSPITAL_COMMUNITY): Payer: Self-pay | Admitting: Gastroenterology

## 2017-09-29 ENCOUNTER — Encounter (HOSPITAL_COMMUNITY)
Admission: EM | Disposition: A | Discharge status: Home / Self Care | Payer: Self-pay | Source: Home / Self Care | Attending: Emergency Medicine

## 2017-09-29 DIAGNOSIS — Z791 Long term (current) use of non-steroidal anti-inflammatories (NSAID): Secondary | ICD-10-CM | POA: Diagnosis not present

## 2017-09-29 DIAGNOSIS — K210 Gastro-esophageal reflux disease with esophagitis: Secondary | ICD-10-CM

## 2017-09-29 DIAGNOSIS — K449 Diaphragmatic hernia without obstruction or gangrene: Secondary | ICD-10-CM

## 2017-09-29 DIAGNOSIS — D539 Nutritional anemia, unspecified: Secondary | ICD-10-CM | POA: Diagnosis not present

## 2017-09-29 DIAGNOSIS — D649 Anemia, unspecified: Secondary | ICD-10-CM | POA: Diagnosis not present

## 2017-09-29 DIAGNOSIS — D62 Acute posthemorrhagic anemia: Secondary | ICD-10-CM | POA: Diagnosis not present

## 2017-09-29 DIAGNOSIS — K283 Acute gastrojejunal ulcer without hemorrhage or perforation: Secondary | ICD-10-CM

## 2017-09-29 DIAGNOSIS — K922 Gastrointestinal hemorrhage, unspecified: Secondary | ICD-10-CM | POA: Diagnosis not present

## 2017-09-29 DIAGNOSIS — K921 Melena: Secondary | ICD-10-CM

## 2017-09-29 HISTORY — PX: ESOPHAGOGASTRODUODENOSCOPY: SHX5428

## 2017-09-29 LAB — BASIC METABOLIC PANEL
ANION GAP: 6 (ref 5–15)
BUN: 13 mg/dL (ref 6–20)
CALCIUM: 8.3 mg/dL — AB (ref 8.9–10.3)
CO2: 27 mmol/L (ref 22–32)
CREATININE: 0.59 mg/dL (ref 0.44–1.00)
Chloride: 111 mmol/L (ref 101–111)
Glucose, Bld: 87 mg/dL (ref 65–99)
Potassium: 3.6 mmol/L (ref 3.5–5.1)
SODIUM: 144 mmol/L (ref 135–145)

## 2017-09-29 LAB — CBC
HEMATOCRIT: 20.4 % — AB (ref 36.0–46.0)
Hemoglobin: 6.8 g/dL — CL (ref 12.0–15.0)
MCH: 33.5 pg (ref 26.0–34.0)
MCHC: 33.3 g/dL (ref 30.0–36.0)
MCV: 100.5 fL — ABNORMAL HIGH (ref 78.0–100.0)
Platelets: 275 10*3/uL (ref 150–400)
RBC: 2.03 MIL/uL — ABNORMAL LOW (ref 3.87–5.11)
RDW: 13.3 % (ref 11.5–15.5)
WBC: 4.1 10*3/uL (ref 4.0–10.5)

## 2017-09-29 LAB — HEMOGLOBIN AND HEMATOCRIT, BLOOD
HCT: 28.7 % — ABNORMAL LOW (ref 36.0–46.0)
Hemoglobin: 9.8 g/dL — ABNORMAL LOW (ref 12.0–15.0)

## 2017-09-29 LAB — PREPARE RBC (CROSSMATCH)

## 2017-09-29 LAB — VITAMIN B12: VITAMIN B 12: 1551 pg/mL — AB (ref 180–914)

## 2017-09-29 LAB — MAGNESIUM: MAGNESIUM: 2 mg/dL (ref 1.7–2.4)

## 2017-09-29 SURGERY — EGD (ESOPHAGOGASTRODUODENOSCOPY)
Anesthesia: Monitor Anesthesia Care

## 2017-09-29 MED ORDER — PANTOPRAZOLE SODIUM 40 MG IV SOLR
40.0000 mg | Freq: Two times a day (BID) | INTRAVENOUS | Status: DC
  Administered 2017-09-29 – 2017-09-30 (×2): 40 mg via INTRAVENOUS
  Filled 2017-09-29 (×3): qty 40

## 2017-09-29 MED ORDER — PROPOFOL 500 MG/50ML IV EMUL
INTRAVENOUS | Status: DC | PRN
  Administered 2017-09-29: 150 ug/kg/min via INTRAVENOUS

## 2017-09-29 MED ORDER — LACTATED RINGERS IV SOLN
INTRAVENOUS | Status: DC | PRN
  Administered 2017-09-29: 10:00:00 via INTRAVENOUS

## 2017-09-29 MED ORDER — PROPOFOL 500 MG/50ML IV EMUL
INTRAVENOUS | Status: DC | PRN
  Administered 2017-09-29 (×2): 30 mg via INTRAVENOUS

## 2017-09-29 MED ORDER — PROPOFOL 10 MG/ML IV BOLUS
INTRAVENOUS | Status: AC
  Filled 2017-09-29: qty 40

## 2017-09-29 MED ORDER — SODIUM CHLORIDE 0.9% IV SOLUTION
Freq: Once | INTRAVENOUS | Status: AC
  Administered 2017-09-29: 09:00:00 via INTRAVENOUS

## 2017-09-29 NOTE — Anesthesia Preprocedure Evaluation (Signed)
Anesthesia Evaluation  Patient identified by MRN, date of birth, ID band Patient awake    Reviewed: Allergy & Precautions, NPO status , Patient's Chart, lab work & pertinent test results  Airway Mallampati: II  TM Distance: >3 FB Neck ROM: Full    Dental  (+) Teeth Intact, Dental Advisory Given   Pulmonary former smoker,    breath sounds clear to auscultation       Cardiovascular hypertension,  Rhythm:Regular Rate:Normal     Neuro/Psych    GI/Hepatic   Endo/Other    Renal/GU      Musculoskeletal   Abdominal   Peds  Hematology   Anesthesia Other Findings   Reproductive/Obstetrics                             Anesthesia Physical Anesthesia Plan  ASA: III  Anesthesia Plan: MAC   Post-op Pain Management:    Induction: Intravenous  PONV Risk Score and Plan: Ondansetron and Propofol infusion  Airway Management Planned: Nasal Cannula and Natural Airway  Additional Equipment:   Intra-op Plan:   Post-operative Plan:   Informed Consent: I have reviewed the patients History and Physical, chart, labs and discussed the procedure including the risks, benefits and alternatives for the proposed anesthesia with the patient or authorized representative who has indicated his/her understanding and acceptance.   Dental advisory given  Plan Discussed with: CRNA and Anesthesiologist  Anesthesia Plan Comments:         Anesthesia Quick Evaluation

## 2017-09-29 NOTE — Interval H&P Note (Signed)
History and Physical Interval Note:  09/29/2017 9:50 AM  Brittany [name]  has presented today for surgery, with the diagnosis of Gastrointestinal bleeding  The various methods of treatment have been discussed with the patient and family. After consideration of risks, benefits and other options for treatment, the patient has consented to  Procedure(s): ESOPHAGOGASTRODUODENOSCOPY (EGD) (N/A) as a surgical intervention .  The patient's history has been reviewed, patient examined, no change in status, stable for surgery.  I have reviewed the patient's chart and labs.  Questions were answered to the patient's satisfaction.     Pricilla Riffle. Fuller Plan

## 2017-09-29 NOTE — Transfer of Care (Signed)
Immediate Anesthesia Transfer of Care Note  Patient: Brittany [name]  Procedure(s) Performed: ESOPHAGOGASTRODUODENOSCOPY (EGD) (N/A )  Patient Location: PACU  Anesthesia Type:MAC  Level of Consciousness: sedated, patient cooperative and responds to stimulation  Airway & Oxygen Therapy: Patient Spontanous Breathing and Patient connected to nasal cannula oxygen  Post-op Assessment: Report given to RN and Post -op Vital signs reviewed and stable  Post vital signs: Reviewed and stable  Last Vitals:  Vitals Value Taken Time  BP 118/78 09/29/2017 10:32 AM  Temp    Pulse 80 09/29/2017 10:32 AM  Resp 13 09/29/2017 10:32 AM  SpO2 100 % 09/29/2017 10:32 AM  Vitals shown include unvalidated device data.  Last Pain:  Vitals:   09/29/17 0953  TempSrc: Oral  PainSc: 0-No pain         Complications: No apparent anesthesia complications

## 2017-09-29 NOTE — Op Note (Signed)
[facility] Patient Name: Brittany [name] Procedure Date: 09/29/2017 MRN: [id number] Attending MD: Ladene Artist , MD Date of Birth: 29-Apr-1965 CSN: [id number] Age: 52 Admit Type: Inpatient Procedure:                Upper GI endoscopy Indications:              Melena, Suspected upper gastrointestinal bleeding Providers:                Pricilla Riffle. Fuller Plan, MD, Baird Cancer, RN, Tinnie Gens, Technician, Herbie Drape, CRNA Referring MD:             [facility] Medicines:                Monitored Anesthesia Care Complications:            No immediate complications. Estimated Blood Loss:     Estimated blood loss: none. Procedure:                Pre-Anesthesia Assessment:                           - Prior to the procedure, a History and Physical                            was performed, and patient medications and                            allergies were reviewed. The patient's tolerance of                            previous anesthesia was also reviewed. The risks                            and benefits of the procedure and the sedation                            options and risks were discussed with the patient.                            All questions were answered, and informed consent                            was obtained. Prior Anticoagulants: The patient has                            taken no previous anticoagulant or antiplatelet                            agents. ASA Grade Assessment: II - A patient with                            mild systemic disease. After reviewing the risks  and benefits, the patient was deemed in                            satisfactory condition to undergo the procedure.                           After obtaining informed consent, the endoscope was                            passed under direct vision. Throughout the                            procedure, the patient's blood pressure,  pulse, and                            oxygen saturations were monitored continuously. The                            EG-[id number] ([id number]) scope was introduced through the                            mouth, and advanced to the efferent jejunal loop.                            The upper GI endoscopy was accomplished without                            difficulty. The patient tolerated the procedure                            well. Scope In: Scope Out: Findings:      LA Grade B (one or more mucosal breaks greater than 5 mm, not extending       between the tops of two mucosal folds) esophagitis with no bleeding was       found in the distal esophagus.      One benign-appearing, intrinsic mild stenosis was found at the       gastroesophageal junction. This stenosis measured 1.4 cm (inner       diameter). The stenosis was traversed.      The exam of the esophagus was otherwise normal.      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 The       gastrojejunal anastomosis was characterized by two clean based       ulcerations on the jejunal side. This was traversed. The       pouch-to-jejunum limb was characterized by healthy appearing mucosa. Two       staples or sutures were noted at the anastomosis separate from the       ulcers.      A small hiatal hernia was present.      The exam of the stomach was otherwise normal.      The examined efferent and afferent jejunum was normal. Impression:               - LA Grade B reflux esophagitis.                           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                           - Roux-en-Y gastrojejunostomy with gastrojejunal                            anastomosis characterized by two clean based                            ulcerations.                           - Small hiatal hernia.                           - Normal examined jejunum.                           - No specimens collected. Moderate Sedation:      N/A- Per  Anesthesia Care Recommendation:           - Return patient to hospital ward for ongoing care.                           - Clear liquid diet today.                           - No aspirin, ibuprofen, naproxen, or other                            non-steroidal anti-inflammatory drugs long term.                           - Protonix (pantoprazole) 40 mg PO BID indefinitely.                           - Antireflux measures long term                           - GI office visit in 1 month Procedure Code(s):        --- Professional ---                           [phone], Esophagogastroduodenoscopy, flexible,                            transoral; diagnostic, including collection of                            specimen(s) by brushing or washing, when performed                            (separate procedure) Diagnosis Code(s):        --- Professional ---                           K21.0, Gastro-esophageal reflux disease with  esophagitis                           Z98.0, Intestinal bypass and anastomosis status                           K44.9, Diaphragmatic hernia without obstruction or                            gangrene                           K92.1, Melena (includes Hematochezia) CPT copyright 2017 [employer]. All rights reserved. The codes documented in this report are preliminary and upon coder review may  be revised to meet current compliance requirements. Ladene Artist, MD 09/29/2017 10:34:55 AM This report has been signed electronically. Number of Addenda: 0

## 2017-09-29 NOTE — Anesthesia Postprocedure Evaluation (Signed)
Anesthesia Post Note  Patient: Ranetta Janan Halter  Procedure(s) Performed: ESOPHAGOGASTRODUODENOSCOPY (EGD) (N/A )     Patient location during evaluation: Endoscopy Anesthesia Type: MAC Level of consciousness: awake and alert Pain management: pain level controlled Vital Signs Assessment: post-procedure vital signs reviewed and stable Respiratory status: spontaneous breathing, nonlabored ventilation, respiratory function stable and patient connected to nasal cannula oxygen Cardiovascular status: stable and blood pressure returned to baseline Postop Assessment: no apparent nausea or vomiting Anesthetic complications: no    Last Vitals:  Vitals:   09/29/17 1141 09/29/17 1355  BP: (!) 157/72 118/65  Pulse: 64 (!) 52  Resp: 16 14  Temp: 36.9 C 37 C  SpO2: 100% 96%    Last Pain:  Vitals:   09/29/17 1355  TempSrc: Oral  PainSc:                  Christipher Rieger COKER

## 2017-09-29 NOTE — Progress Notes (Signed)
PROGRESS NOTE    Brittany [name]  CNO:[id number] DOB: 02/27/1966 DOA: 09/28/2017 PCP: Golden Circle, FNP    Brief Narrative:  Patient is a 52 year old female with history of Roux-en-Y gastric bypass, abdominal abscess post bowel resection, and depression admitted on 09/28/17 for suspected GI bleed. Patient presented to the ED with increasing generalized weakness, multiple syncopal episodes, and melena. She was found to have a hemoglobin of 7.7 on admission and positive FOBT. She reports in the past year she was taking Ibuprofen for about 3 months after an injury. The working diagnosis is a GI bleed in the setting of NSAID use. This morning patient received 1 unit PRBCs and had a EGD.    Assessment & Plan:   Active Problems:   Acute GI bleeding   Melena   Acute gastrojejunal anastomotic ulcer   Acute GI bleed with melena -FOBT positive, Hemoglobin 7.7 in the ED and 6.8 this morning prior to transfusion. Monitor for response to transfusion, check CBC tomorrow.  -1 unit PRBCs transfused this morning, patient felt much better after. Will transfuse 1 more unit -EGD done by Dr. Fuller Plan. Showed LA grade B reflux, esophageal stenosis, Roux-en-Y with two clean based ulcerations, small hiatal hernia. -Continue Protonix 40mg  BID, clear liquid diet today -Long term antireflux, avoid NSAID use, will need to follow up with GI outpatient in 1 month -Monitor BP for hypotension  Symptomatic anemia -Likely secondary to GI bleed -Hemoglobin 7.7 on arrival to ED, 6.8 this morning prior to transfusion -Transfused 2 units PRBCs -Repeat CBC tomorrow. If patient has not responded to transfusion will consult GI for further evaluation -Continue home Iron supplementation  Chronic depression/anxiety -Continue Cymbalta, Seroquel, and PRN Ativan  Vitamin D Deficiency -Continue Vitamin D supplementation   DVT prophylaxis:SCDs, suspected active bleed  Code Status: Full Code Family Communication:  None Disposition Plan: Home when improved   Consultants:   GI  Procedures:  EGD showing LA Grade B reflux esophagitis, esophageal stenosis at EG junction, Roux-en-Y gastrojejunostomy with gastrojejunal anastomosis characterized by two clean based ulcerations, small hiatal hernia, normal examined jejunum, no specimens collected.   Antimicrobials:   None   Subjective: Patient reports feeling very weak and fatigued but is not actively in pain. She feels slightly better than arrival at the ED. She has not had a bowel movement since arriving to the ED so is unsure if she is still having dark stools. She denies abdominal pain, vomiting, lightheadedness, and dizziness.   Objective: Vitals:   09/29/17 0847 09/29/17 0910 09/29/17 0953 09/29/17 1032  BP: 132/72 (!) 135/56 (!) 144/78 118/78  Pulse: 70 70 72 80  Resp: 14 16 14 13   Temp: 98.7 F (37.1 C) 98.3 F (36.8 C) 98 F (36.7 C) 98.8 F (37.1 C)  TempSrc: Oral Oral Oral Oral  SpO2: 100% 99% 100% 100%  Weight:   117.5 kg (259 lb)   Height:   5\' 10"  (1.778 m)     Intake/Output Summary (Last 24 hours) at 09/29/2017 1049 Last data filed at 09/29/2017 1032 Gross per 24 hour  Intake 500 ml  Output -  Net 500 ml   Filed Weights   09/28/17 1931 09/29/17 0953  Weight: 117.9 kg (259 lb 14.8 oz) 117.5 kg (259 lb)    Examination:   General: Not in pain, appears weak and pale. Neurology: Awake and alert, non focal  E ENT: + pallor, no icterus, oral mucosa moist Cardiovascular: No JVD. S1-S2 present, rhythmic, no gallops, rubs,  or murmurs. No lower extremity edema. Good capillary refill.  Pulmonary: vesicular breath sounds bilaterally, adequate air movement, no wheezing, rhonchi or rales. Gastrointestinal. Abdomen nontender and nondistended, no organomegaly, no rebound or guarding Skin. No rashes Musculoskeletal: no joint deformities     Data Reviewed: I have personally reviewed following labs and imaging  studies  CBC: Recent Labs  Lab 09/28/17 1514 09/29/17 0419  WBC 6.1 4.1  HGB 7.7* 6.8*  HCT 23.4* 20.4*  MCV 100.9* 100.5*  PLT 330 824   Basic Metabolic Panel: Recent Labs  Lab 09/28/17 1514 09/29/17 0419  NA 142 144  K 3.7 3.6  CL 109 111  CO2 27 27  GLUCOSE 93 87  BUN 18 13  CREATININE 0.58 0.59  CALCIUM 8.5* 8.3*  MG  --  2.0   GFR: Estimated Creatinine Clearance: 114.4 mL/min (by C-G formula based on SCr of 0.59 mg/dL). Liver Function Tests: Recent Labs  Lab 09/28/17 1514  AST 12*  ALT 15  ALKPHOS 68  BILITOT 0.3  PROT 6.0*  ALBUMIN 3.3*   No results for input(s): LIPASE, AMYLASE in the last 168 hours. No results for input(s): AMMONIA in the last 168 hours. Coagulation Profile: No results for input(s): INR, PROTIME in the last 168 hours. Cardiac Enzymes: No results for input(s): CKTOTAL, CKMB, CKMBINDEX, TROPONINI in the last 168 hours. BNP (last 3 results) No results for input(s): PROBNP in the last 8760 hours. HbA1C: No results for input(s): HGBA1C in the last 72 hours. CBG: No results for input(s): GLUCAP in the last 168 hours. Lipid Profile: No results for input(s): CHOL, HDL, LDLCALC, TRIG, CHOLHDL, LDLDIRECT in the last 72 hours. Thyroid Function Tests: No results for input(s): TSH, T4TOTAL, FREET4, T3FREE, THYROIDAB in the last 72 hours. Anemia Panel: Recent Labs    09/28/17 1655 09/29/17 0419  VITAMINB12 1,833* 1,551*  FOLATE 30.8  --       Radiology Studies: I have reviewed all of the imaging during this hospital visit personally     Scheduled Meds: . [MAR Hold] calcium-vitamin D  3 tablet Oral Q breakfast  . [MAR Hold] DULoxetine  60 mg Oral BID  . [MAR Hold] multivitamin with minerals  1 tablet Oral Daily  . [MAR Hold] pantoprazole (PROTONIX) IV  40 mg Intravenous Q12H  . [MAR Hold] QUEtiapine  150 mg Oral QHS  . [MAR Hold] vitamin B-12  500 mcg Oral Daily  . [MAR Hold] vitamin E  1,000 Units Oral Daily   Continuous  Infusions: . sodium chloride 75 mL/hr at 09/28/17 2029     LOS: 1 day    Carita Pian, PA-S    [facility] Pager [phone]

## 2017-09-29 NOTE — Consult Note (Addendum)
Referring Provider: [facility]   Primary Care Physician:  Golden Circle, FNP Primary Gastroenterologist:  Dr. Fuller Plan  Reason for Consultation:    Gastrointestinal bleeding    ASSESSMENT AND PLAN:     52 year old female with melena and anemia of acute blood loss in setting of NSAID use.  She has had a Roux-en-Y.  Rule out anastomotic ulcer -Patient will need an EGD this a.m. The risks and benefits of EGD were discussed and the patient agrees to proceed.  -She is already getting blood transfusion -On Pepcid, changing to PPI    Attending physician's note   I have taken a history, examined the patient and reviewed the chart. I agree with the Advanced Practitioner's note, impression and recommendations.  Acute GI bleed initially with maroon stools then with melena. ABL anemia. Frequent Goody powder usage. S/P Roux-en-Y gastric bypass. Colonoscopy 05/2016 showed moderate left colon diverticulosis and internal hemorrhoids. Not clear if acute bleed is UGI or LGI. R/O ulcer, diverticular bleed.  IV PPI for now.  Avoid all ASA/NSAID products.  Transfusions to keep Hb > 7.  EGD today.   Lucio Edward, MD [facility] [phone] office    HPI: Lizeth KASUMI DITULLIO is a 52 y.o. female multiple colon cancer.  Patient had a complete colonoscopy with excellent prep for over 2018.  Findings included small hemorrhoids and diverticulosis.  Patient also has a history of obesity, status post Roux-en-Y in 2015.   On Friday Haliegh began seeing maroon blood in her stool.  Stools eventually turned black and continued through the weekend.  She presented emergency department yesterday with hemoglobin of 7 down from baseline around 12.  This morning hemoglobin is 6.8, she is getting blood now.  Patient has been taking Goody powders about twice a week.  She has no abdominal pain but does admit to some nausea without vomiting.  She is gaining weight being unable to exercise after a recent injury.  Prior to the  onset of bloody stools patient was feeling well.  After the bleeding started she did become dizzy with near syncope.  She has been short of breath with the bleeding    Past Medical History:  Diagnosis Date  . Allergy   . Anxiety   . Arthritis    hips, knees and hands   . Depression   . GERD (gastroesophageal reflux disease)    hx of   . H/O hiatal hernia   . History of chicken pox   . Hypercholesterolemia   . Hypertension   . Migraine   . Migraines   . Morbid obesity ([place])   . Positive TB test   . PTSD (post-traumatic stress disorder)   . Sleep apnea    no CPAP machine     Past Surgical History:  Procedure Laterality Date  . ABDOMINAL HYSTERECTOMY  1997 & 2006  . BREATH TEK H PYLORI  05/03/2012   Procedure: BREATH TEK H PYLORI;  Surgeon: [name], MD;  Location: [name];  Service: General;  Laterality: N/A;  . COLONOSCOPY     hx of benign polyps   . FOOT SURGERY    . GASTRIC BYPASS    . GASTRIC ROUX-EN-Y N/A 07/05/2012   Procedure: LAPAROSCOPIC ROUX-EN-Y GASTRIC;  Surgeon: [name], MD;  Location: [facility];  Service: General;  Laterality: N/A;  . IRRIGATION AND DEBRIDEMENT ABSCESS N/A 01/01/2014   Procedure: IRRIGATION AND DEBRIDEMENT ABSCESS;  Surgeon: [name], MD;  Location: [facility];  Service: General;  Laterality: N/A;  . LAPAROSCOPIC LYSIS OF ADHESIONS N/A 07/05/2012   Procedure: LAPAROSCOPIC LYSIS OF ADHESIONS;  Surgeon: [name], MD;  Location: [facility];  Service: General;  Laterality: N/A;  repair of abdominal wall hernia  . right tube and ovary removed     . UPPER GI ENDOSCOPY N/A 07/05/2012   Procedure: UPPER GI ENDOSCOPY;  Surgeon: [name], MD;  Location: [facility];  Service: General;  Laterality: N/A;    Prior to Admission medications   Medication Sig Start Date End Date Taking? Authorizing Provider  calcium-vitamin D (OSCAL WITH D) 500-200 MG-UNIT tablet Take 3 tablets by mouth every morning. For bone health 09/17/15  Yes Lindell Spar I, NP  cholecalciferol (VITAMIN D) 1000 units tablet Take 1 tablet (1,000 Units total) by mouth daily. For bone health 09/17/15  Yes Lindell Spar I, NP  DULoxetine (CYMBALTA) 60 MG capsule Take 1 capsule (60 mg total) by mouth 2 (two) times daily. 08/31/17  Yes Arfeen, Arlyce Harman, MD  IRON PO Take 1 tablet by mouth daily.   Yes [provider]  loratadine (CLARITIN) 10 MG tablet Take 10 mg by mouth daily.   Yes [provider]  LORazepam (ATIVAN) 0.5 MG tablet Take 1 tablet (0.5 mg total) by mouth daily as needed for anxiety. 08/31/17 08/31/18 Yes Arfeen, Arlyce Harman, MD  Multiple Vitamins-Minerals (MULTIVITAMIN WITH MINERALS) tablet Take 1 tablet by mouth daily. For low vitamin 09/17/15  Yes Nwoko, Herbert Pun I, NP  QUEtiapine (SEROQUEL) 50 MG tablet Take 3 tablets (150 mg total) by mouth at bedtime. 08/31/17  Yes Arfeen, Arlyce Harman, MD  vitamin B-12 (CYANOCOBALAMIN) 500 MCG tablet Take 1 tablet (500 mcg total) by mouth daily. For Vitamin B-12 replacement 09/17/15  Yes Lindell Spar I, NP  vitamin E 1000 UNIT capsule Take 1,000 Units by mouth daily.   Yes [provider]  Ibuprofen-Famotidine 800-26.6 MG TABS Take 1 tablet (800 mg) every 8 hours as needed for pain. Patient not taking: Reported on 09/28/2017 07/23/16   Golden Circle, FNP  loratadine (CLARITIN) 10 MG tablet Take 1 tablet (10 mg total) by mouth daily. For allergies Patient not taking: Reported on 09/28/2017 09/17/15   Lindell Spar I, NP    Current Facility-Administered Medications  Medication Dose Route Frequency Provider Last Rate Last Dose  . 0.9 %  sodium chloride infusion   Intravenous Continuous Kayleen Memos, DO 75 mL/hr at 09/28/17 2029    . calcium-vitamin D (OSCAL WITH D) 500-200 MG-UNIT per tablet 3 tablet  3 tablet Oral Q breakfast Hall, Carole N, DO      . DULoxetine (CYMBALTA) DR capsule 60 mg  60 mg Oral BID Irene Pap N, DO   60 mg at 09/28/17 2216  . famotidine (PEPCID) IVPB 20 mg premix  20 mg Intravenous  Q12H Kayleen Memos, DO   Stopped at 09/28/17 2247  . LORazepam (ATIVAN) tablet 0.5 mg  0.5 mg Oral Daily PRN Kayleen Memos, DO      . multivitamin with minerals tablet 1 tablet  1 tablet Oral Daily Hall, Carole N, DO      . QUEtiapine (SEROQUEL) tablet 150 mg  150 mg Oral QHS Hall, Carole N, DO   150 mg at 09/28/17 2216  . vitamin B-12 (CYANOCOBALAMIN) tablet 500 mcg  500 mcg Oral Daily Hall, Carole N, DO      . vitamin E capsule 1,000 Units  1,000 Units Oral Daily Kayleen Memos, DO  Allergies as of 09/28/2017 - Review Complete 09/28/2017  Allergen Reaction Noted  . Contrast media [iodinated diagnostic agents] Hives 09/28/2017  . Amoxicillin Hives 10/20/2010  . Bactrim [sulfamethoxazole-trimethoprim] Hives and Rash 01/09/2014  . Lamictal [lamotrigine] Hives and Rash 08/31/2014    Family History  Problem Relation Age of Onset  . Colon cancer Mother 52  . Depression Father   . Diabetes Father   . Heart disease Father   . Bladder Cancer Father   . Depression Sister   . Post-traumatic stress disorder Sister   . Alcohol abuse Paternal Uncle   . Bipolar disorder Cousin   . Anxiety disorder Sister   . Cancer Maternal Aunt        breast  . Melanoma Maternal Grandmother   . Colon cancer Maternal Grandmother        does not know age of onset  . Healthy Maternal Grandfather   . Healthy Paternal Grandmother   . Heart disease Paternal Grandfather   . Stroke Paternal Grandfather   . Diabetes Paternal Grandfather     Social History   Socioeconomic History  . Marital status: Legally Separated    Spouse name: Not on file  . Number of children: 0  . Years of education: 69  . Highest education level: Not on file  Occupational History  . Occupation: Software engineer   Social Needs  . Financial resource strain: Not on file  . Food insecurity:    Worry: Not on file    Inability: Not on file  . Transportation needs:    Medical: Not on file    Non-medical: Not  on file  Tobacco Use  . Smoking status: Former Smoker    Packs/day: 0.10    Years: 1.00    Pack years: 0.10    Types: Cigarettes    Last attempt to quit: 04/27/2004    Years since quitting: 13.4  . Smokeless tobacco: Never Used  . Tobacco comment: Smokes one at times.   Substance and Sexual Activity  . Alcohol use: No    Alcohol/week: 0.0 oz    Comment: Occasional use/ 2 drinks a month  . Drug use: No  . Sexual activity: Not Currently    Birth control/protection: Surgical  Lifestyle  . Physical activity:    Days per week: Not on file    Minutes per session: Not on file  . Stress: Not on file  Relationships  . Social connections:    Talks on phone: Not on file    Gets together: Not on file    Attends religious service: Not on file    Active member of club or organization: Not on file    Attends meetings of clubs or organizations: Not on file    Relationship status: Not on file  . Intimate partner violence:    Fear of current or ex partner: Not on file    Emotionally abused: Not on file    Physically abused: Not on file    Forced sexual activity: Not on file  Other Topics Concern  . Not on file  Social History Narrative   Fun: Color and walk, house renovations   Denies abuse and feels safe at home.     Review of Systems: All systems reviewed and negative except where noted in HPI.  Physical Exam: Vital signs in last 24 hours: Temp:  [98.1 F (36.7 C)-98.7 F (37.1 C)] 98.3 F (36.8 C) (06/18 0910) Pulse Rate:  [69-90] 70 (06/18 0910)  Resp:  [10-18] 16 (06/18 0910) BP: (98-135)/(49-83) 135/56 (06/18 0910) SpO2:  [94 %-100 %] 99 % (06/18 0910) Weight:  [259 lb 14.8 oz (117.9 kg)] 259 lb 14.8 oz (117.9 kg) (06/17 1931) Last BM Date: 09/28/17 General:   Alert, female in NAD Psych:  Pleasant, cooperative. Normal mood and affect. Eyes:  Pupils equal, sclera clear, no icterus.   Conjunctiva pink. Ears:  Normal auditory acuity. Nose:  No deformity, discharge,  or  lesions. Neck:  Supple; no masses Lungs:  Clear throughout to auscultation.   No wheezes, crackles, or rhonchi.  Heart:  Regular rate and rhythm;  no edema Abdomen:  Soft, non-distended, nontender, BS active, no palp mass    Rectal:  Deferred  Msk:  Symmetrical without gross deformities. . Neurologic:  Alert and  oriented x4;  grossly normal neurologically. Skin:  Intact without significant lesions or rashes..   Intake/Output from previous day: No intake/output data recorded. Intake/Output this shift: No intake/output data recorded.  Lab Results: Recent Labs    09/28/17 1514 09/29/17 0419  WBC 6.1 4.1  HGB 7.7* 6.8*  HCT 23.4* 20.4*  PLT 330 275   BMET Recent Labs    09/28/17 1514 09/29/17 0419  NA 142 144  K 3.7 3.6  CL 109 111  CO2 27 27  GLUCOSE 93 87  BUN 18 13  CREATININE 0.58 0.59  CALCIUM 8.5* 8.3*   LFT Recent Labs    09/28/17 1514  PROT 6.0*  ALBUMIN 3.3*  AST 12*  ALT 15  ALKPHOS 68  BILITOT 0.3    Tye Savoy, NP-C @  09/29/2017, 9:20 AM

## 2017-09-29 NOTE — Progress Notes (Signed)
CRITICAL VALUE ALERT  Critical Value:  HGB 6.8  Date & Time Notied:  09/29/17 0658  Provider Notified: Paged [facility]  Orders Received/Actions taken: waiting orders

## 2017-09-29 NOTE — H&P (View-Only) (Signed)
Referring Provider: [facility]   Primary Care Physician:  Golden Circle, FNP Primary Gastroenterologist:  Dr. Fuller Plan  Reason for Consultation:    Gastrointestinal bleeding    ASSESSMENT AND PLAN:     52 year old female with melena and anemia of acute blood loss in setting of NSAID use.  She has had a Roux-en-Y.  Rule out anastomotic ulcer -Patient will need an EGD this a.m. The risks and benefits of EGD were discussed and the patient agrees to proceed.  -She is already getting blood transfusion -On Pepcid, changing to PPI    Attending physician's note   I have taken a history, examined the patient and reviewed the chart. I agree with the Advanced Practitioner's note, impression and recommendations.  Acute GI bleed initially with maroon stools then with melena. ABL anemia. Frequent Goody powder usage. S/P Roux-en-Y gastric bypass. Colonoscopy 05/2016 showed moderate left colon diverticulosis and internal hemorrhoids. Not clear if acute bleed is UGI or LGI. R/O ulcer, diverticular bleed.  IV PPI for now.  Avoid all ASA/NSAID products.  Transfusions to keep Hb > 7.  EGD today.   Lucio Edward, MD [facility] [phone] office    HPI: Brittany [name] is a 52 y.o. female multiple colon cancer.  Patient had a complete colonoscopy with excellent prep for over 2018.  Findings included small hemorrhoids and diverticulosis.  Patient also has a history of obesity, status post Roux-en-Y in 2015.   On Friday [name] began seeing maroon blood in her stool.  Stools eventually turned black and continued through the weekend.  She presented emergency department yesterday with hemoglobin of 7 down from baseline around 12.  This morning hemoglobin is 6.8, she is getting blood now.  Patient has been taking Goody powders about twice a week.  She has no abdominal pain but does admit to some nausea without vomiting.  She is gaining weight being unable to exercise after a recent injury.  Prior to the  onset of bloody stools patient was feeling well.  After the bleeding started she did become dizzy with near syncope.  She has been short of breath with the bleeding    Past Medical History:  Diagnosis Date  . Allergy   . Anxiety   . Arthritis    hips, knees and hands   . Depression   . GERD (gastroesophageal reflux disease)    hx of   . H/O hiatal hernia   . History of chicken pox   . Hypercholesterolemia   . Hypertension   . Migraine   . Migraines   . Morbid obesity ([place])   . Positive TB test   . PTSD (post-traumatic stress disorder)   . Sleep apnea    no CPAP machine     Past Surgical History:  Procedure Laterality Date  . ABDOMINAL HYSTERECTOMY  1997 & 2006  . BREATH TEK H PYLORI  05/03/2012   Procedure: BREATH TEK H PYLORI;  Surgeon: [name], MD;  Location: [name];  Service: General;  Laterality: N/A;  . COLONOSCOPY     hx of benign polyps   . FOOT SURGERY    . GASTRIC BYPASS    . GASTRIC ROUX-EN-Y N/A 07/05/2012   Procedure: LAPAROSCOPIC ROUX-EN-Y GASTRIC;  Surgeon: [name], MD;  Location: [facility];  Service: General;  Laterality: N/A;  . IRRIGATION AND DEBRIDEMENT ABSCESS N/A 01/01/2014   Procedure: IRRIGATION AND DEBRIDEMENT ABSCESS;  Surgeon: [name], MD;  Location: [facility];  Service: General;  Laterality: N/A;  . LAPAROSCOPIC LYSIS OF ADHESIONS N/A 07/05/2012   Procedure: LAPAROSCOPIC LYSIS OF ADHESIONS;  Surgeon: [name], MD;  Location: [facility];  Service: General;  Laterality: N/A;  repair of abdominal wall hernia  . right tube and ovary removed     . UPPER GI ENDOSCOPY N/A 07/05/2012   Procedure: UPPER GI ENDOSCOPY;  Surgeon: [name], MD;  Location: [facility];  Service: General;  Laterality: N/A;    Prior to Admission medications   Medication Sig Start Date End Date Taking? Authorizing Provider  calcium-vitamin D (OSCAL WITH D) 500-200 MG-UNIT tablet Take 3 tablets by mouth every morning. For bone health 09/17/15  Yes Lindell Spar I, NP  cholecalciferol (VITAMIN D) 1000 units tablet Take 1 tablet (1,000 Units total) by mouth daily. For bone health 09/17/15  Yes Lindell Spar I, NP  DULoxetine (CYMBALTA) 60 MG capsule Take 1 capsule (60 mg total) by mouth 2 (two) times daily. 08/31/17  Yes Arfeen, Arlyce Harman, MD  IRON PO Take 1 tablet by mouth daily.   Yes [provider]  loratadine (CLARITIN) 10 MG tablet Take 10 mg by mouth daily.   Yes [provider]  LORazepam (ATIVAN) 0.5 MG tablet Take 1 tablet (0.5 mg total) by mouth daily as needed for anxiety. 08/31/17 08/31/18 Yes Arfeen, Arlyce Harman, MD  Multiple Vitamins-Minerals (MULTIVITAMIN WITH MINERALS) tablet Take 1 tablet by mouth daily. For low vitamin 09/17/15  Yes Nwoko, Herbert Pun I, NP  QUEtiapine (SEROQUEL) 50 MG tablet Take 3 tablets (150 mg total) by mouth at bedtime. 08/31/17  Yes Arfeen, Arlyce Harman, MD  vitamin B-12 (CYANOCOBALAMIN) 500 MCG tablet Take 1 tablet (500 mcg total) by mouth daily. For Vitamin B-12 replacement 09/17/15  Yes Lindell Spar I, NP  vitamin E 1000 UNIT capsule Take 1,000 Units by mouth daily.   Yes [provider]  Ibuprofen-Famotidine 800-26.6 MG TABS Take 1 tablet (800 mg) every 8 hours as needed for pain. Patient not taking: Reported on 09/28/2017 07/23/16   Golden Circle, FNP  loratadine (CLARITIN) 10 MG tablet Take 1 tablet (10 mg total) by mouth daily. For allergies Patient not taking: Reported on 09/28/2017 09/17/15   Lindell Spar I, NP    Current Facility-Administered Medications  Medication Dose Route Frequency Provider Last Rate Last Dose  . 0.9 %  sodium chloride infusion   Intravenous Continuous Kayleen Memos, DO 75 mL/hr at 09/28/17 2029    . calcium-vitamin D (OSCAL WITH D) 500-200 MG-UNIT per tablet 3 tablet  3 tablet Oral Q breakfast Hall, Carole N, DO      . DULoxetine (CYMBALTA) DR capsule 60 mg  60 mg Oral BID Irene Pap N, DO   60 mg at 09/28/17 2216  . famotidine (PEPCID) IVPB 20 mg premix  20 mg Intravenous  Q12H Kayleen Memos, DO   Stopped at 09/28/17 2247  . LORazepam (ATIVAN) tablet 0.5 mg  0.5 mg Oral Daily PRN Kayleen Memos, DO      . multivitamin with minerals tablet 1 tablet  1 tablet Oral Daily Hall, Carole N, DO      . QUEtiapine (SEROQUEL) tablet 150 mg  150 mg Oral QHS Hall, Carole N, DO   150 mg at 09/28/17 2216  . vitamin B-12 (CYANOCOBALAMIN) tablet 500 mcg  500 mcg Oral Daily Hall, Carole N, DO      . vitamin E capsule 1,000 Units  1,000 Units Oral Daily Kayleen Memos, DO  Allergies as of 09/28/2017 - Review Complete 09/28/2017  Allergen Reaction Noted  . Contrast media [iodinated diagnostic agents] Hives 09/28/2017  . Amoxicillin Hives 10/20/2010  . Bactrim [sulfamethoxazole-trimethoprim] Hives and Rash 01/09/2014  . Lamictal [lamotrigine] Hives and Rash 08/31/2014    Family History  Problem Relation Age of Onset  . Colon cancer Mother 107  . Depression Father   . Diabetes Father   . Heart disease Father   . Bladder Cancer Father   . Depression Sister   . Post-traumatic stress disorder Sister   . Alcohol abuse Paternal Uncle   . Bipolar disorder Cousin   . Anxiety disorder Sister   . Cancer Maternal Aunt        breast  . Melanoma Maternal Grandmother   . Colon cancer Maternal Grandmother        does not know age of onset  . Healthy Maternal Grandfather   . Healthy Paternal Grandmother   . Heart disease Paternal Grandfather   . Stroke Paternal Grandfather   . Diabetes Paternal Grandfather     Social History   Socioeconomic History  . Marital status: Legally Separated    Spouse name: Not on file  . Number of children: 0  . Years of education: 25  . Highest education level: Not on file  Occupational History  . Occupation: Software engineer   Social Needs  . Financial resource strain: Not on file  . Food insecurity:    Worry: Not on file    Inability: Not on file  . Transportation needs:    Medical: Not on file    Non-medical: Not  on file  Tobacco Use  . Smoking status: Former Smoker    Packs/day: 0.10    Years: 1.00    Pack years: 0.10    Types: Cigarettes    Last attempt to quit: 04/27/2004    Years since quitting: 13.4  . Smokeless tobacco: Never Used  . Tobacco comment: Smokes one at times.   Substance and Sexual Activity  . Alcohol use: No    Alcohol/week: 0.0 oz    Comment: Occasional use/ 2 drinks a month  . Drug use: No  . Sexual activity: Not Currently    Birth control/protection: Surgical  Lifestyle  . Physical activity:    Days per week: Not on file    Minutes per session: Not on file  . Stress: Not on file  Relationships  . Social connections:    Talks on phone: Not on file    Gets together: Not on file    Attends religious service: Not on file    Active member of club or organization: Not on file    Attends meetings of clubs or organizations: Not on file    Relationship status: Not on file  . Intimate partner violence:    Fear of current or ex partner: Not on file    Emotionally abused: Not on file    Physically abused: Not on file    Forced sexual activity: Not on file  Other Topics Concern  . Not on file  Social History Narrative   Fun: Color and walk, house renovations   Denies abuse and feels safe at home.     Review of Systems: All systems reviewed and negative except where noted in HPI.  Physical Exam: Vital signs in last 24 hours: Temp:  [98.1 F (36.7 C)-98.7 F (37.1 C)] 98.3 F (36.8 C) (06/18 0910) Pulse Rate:  [69-90] 70 (06/18 0910)  Resp:  [10-18] 16 (06/18 0910) BP: (98-135)/(49-83) 135/56 (06/18 0910) SpO2:  [94 %-100 %] 99 % (06/18 0910) Weight:  [259 lb 14.8 oz (117.9 kg)] 259 lb 14.8 oz (117.9 kg) (06/17 1931) Last BM Date: 09/28/17 General:   Alert, female in NAD Psych:  Pleasant, cooperative. Normal mood and affect. Eyes:  Pupils equal, sclera clear, no icterus.   Conjunctiva pink. Ears:  Normal auditory acuity. Nose:  No deformity, discharge,  or  lesions. Neck:  Supple; no masses Lungs:  Clear throughout to auscultation.   No wheezes, crackles, or rhonchi.  Heart:  Regular rate and rhythm;  no edema Abdomen:  Soft, non-distended, nontender, BS active, no palp mass    Rectal:  Deferred  Msk:  Symmetrical without gross deformities. . Neurologic:  Alert and  oriented x4;  grossly normal neurologically. Skin:  Intact without significant lesions or rashes..   Intake/Output from previous day: No intake/output data recorded. Intake/Output this shift: No intake/output data recorded.  Lab Results: Recent Labs    09/28/17 1514 09/29/17 0419  WBC 6.1 4.1  HGB 7.7* 6.8*  HCT 23.4* 20.4*  PLT 330 275   BMET Recent Labs    09/28/17 1514 09/29/17 0419  NA 142 144  K 3.7 3.6  CL 109 111  CO2 27 27  GLUCOSE 93 87  BUN 18 13  CREATININE 0.58 0.59  CALCIUM 8.5* 8.3*   LFT Recent Labs    09/28/17 1514  PROT 6.0*  ALBUMIN 3.3*  AST 12*  ALT 15  ALKPHOS 68  BILITOT 0.3    Tye Savoy, NP-C @  09/29/2017, 9:20 AM

## 2017-09-30 ENCOUNTER — Encounter (HOSPITAL_COMMUNITY): Payer: Self-pay | Admitting: Gastroenterology

## 2017-09-30 DIAGNOSIS — K283 Acute gastrojejunal ulcer without hemorrhage or perforation: Secondary | ICD-10-CM | POA: Diagnosis not present

## 2017-09-30 DIAGNOSIS — D649 Anemia, unspecified: Secondary | ICD-10-CM | POA: Diagnosis not present

## 2017-09-30 DIAGNOSIS — K210 Gastro-esophageal reflux disease with esophagitis: Secondary | ICD-10-CM | POA: Diagnosis not present

## 2017-09-30 DIAGNOSIS — D62 Acute posthemorrhagic anemia: Secondary | ICD-10-CM | POA: Diagnosis not present

## 2017-09-30 DIAGNOSIS — K921 Melena: Secondary | ICD-10-CM

## 2017-09-30 DIAGNOSIS — K922 Gastrointestinal hemorrhage, unspecified: Secondary | ICD-10-CM | POA: Diagnosis not present

## 2017-09-30 LAB — BPAM RBC
BLOOD PRODUCT EXPIRATION DATE: 201907112359
Blood Product Expiration Date: 201907112359
ISSUE DATE / TIME: 201906180850
ISSUE DATE / TIME: 201906181122
UNIT TYPE AND RH: 5100
Unit Type and Rh: 5100

## 2017-09-30 LAB — TYPE AND SCREEN
ABO/RH(D): O POS
ANTIBODY SCREEN: NEGATIVE
UNIT DIVISION: 0
Unit division: 0

## 2017-09-30 LAB — FOLATE RBC
Folate, Hemolysate: 391.9 ng/mL
Folate, RBC: 1912 ng/mL (ref >498)
Hematocrit: 20.5 % — ABNORMAL LOW (ref 34.0–46.6)

## 2017-09-30 LAB — CBC
HCT: 27.9 % — ABNORMAL LOW (ref 36.0–46.0)
Hemoglobin: 9.7 g/dL — ABNORMAL LOW (ref 12.0–15.0)
MCH: 32.7 pg (ref 26.0–34.0)
MCHC: 34.8 g/dL (ref 30.0–36.0)
MCV: 93.9 fL (ref 78.0–100.0)
PLATELETS: 312 10*3/uL (ref 150–400)
RBC: 2.97 MIL/uL — ABNORMAL LOW (ref 3.87–5.11)
RDW: 16.3 % — AB (ref 11.5–15.5)
WBC: 4.4 10*3/uL (ref 4.0–10.5)

## 2017-09-30 MED ORDER — PANTOPRAZOLE SODIUM 40 MG PO TBEC: 40.0000 mg | DELAYED_RELEASE_TABLET | Freq: Two times a day (BID) | ORAL | 1 refills | Status: DC

## 2017-09-30 MED ORDER — POLYSACCHARIDE IRON COMPLEX 150 MG PO CAPS: 150.0000 mg | ORAL_CAPSULE | Freq: Two times a day (BID) | ORAL | 0 refills | Status: DC

## 2017-09-30 NOTE — Progress Notes (Addendum)
     [facility] Progress Note   Chief Complaint:   GI bleed   SUBJECTIVE:   No further bleeding. Ate breakfast, ambulated in halls. Feels so much better today.    ASSESSMENT AND PLAN:   1. 52 yo female with UGIB secondary to anastomotic ulcers. EGD yesterday >> two clean based anastomotic ulcers found on jejunal side of gastrojejunal anastomosis.  -NO more Goody powders or any antiinflammatories -BID PPI indefinitely -follow up with me July 19th at 9:30a -will need follow up EGD in 2-3 months, will arrange at time of follow up   2. Anemia of ABL. Hgb up to and stable at 9.7 post 2 units of blood yesterday   Discharge Planning Diet: as tolerated Discharge Medications:  Protonix 40 mg BID - indefinitely Follow up: 10/30/17 at 9:30am     Attending physician's note   I have taken an interval history, reviewed the chart and examined the patient. I agree with the Advanced Practitioner's note, impression and recommendations. Acute UGI bleed secondary to anastomotic ulcers. LA Grade B erosive esophagitis. ABL anemia improved post transfusion. PPI bid long term. No ASA/NSAIDs long term. OK for discharge today from GI standpoint. Follow up as above. GI signing off.   Lucio Edward, MD FACG [phone] office     OBJECTIVE:     Vital signs in last 24 hours: Temp:  [98 F (36.7 C)-98.8 F (37.1 C)] 98 F (36.7 C) (06/19 0508) Pulse Rate:  [52-80] 71 (06/19 0508) Resp:  [11-16] 14 (06/19 0508) BP: (118-157)/(65-79) 134/70 (06/19 0508) SpO2:  [96 %-100 %] 98 % (06/19 0508) Weight:  [259 lb (117.5 kg)] 259 lb (117.5 kg) (06/18 0953) Last BM Date: 09/28/17 General:   Alert, white female in NAD EENT:  Normal hearing, non icteric sclera, conjunctive pink.  Heart:  Regular rate and rhythm; no lower extremity edema Pulm: Normal respiratory effort Abdomen:  Soft, nondistended, nontender.  Normal bowel sounds, no masses felt.  Neurologic:  Alert and  oriented x4;   grossly normal neurologically. Psych:  Pleasant, cooperative.  Normal mood and affect.   Intake/Output from previous day: 06/18 0701 - 06/19 0700 In: 1517.5 [P.O.:360; I.V.:800; Blood:357.5] Out: -  Intake/Output this shift: No intake/output data recorded.  Lab Results: Recent Labs    09/28/17 1514 09/29/17 0419 09/29/17 1602 09/30/17 0416  WBC 6.1 4.1  --  4.4  HGB 7.7* 6.8* 9.8* 9.7*  HCT 23.4* 20.4* 28.7* 27.9*  PLT 330 275  --  312   BMET Recent Labs    09/28/17 1514 09/29/17 0419  NA 142 144  K 3.7 3.6  CL 109 111  CO2 27 27  GLUCOSE 93 87  BUN 18 13  CREATININE 0.58 0.59  CALCIUM 8.5* 8.3*   LFT Recent Labs    09/28/17 1514  PROT 6.0*  ALBUMIN 3.3*  AST 12*  ALT 15  ALKPHOS 68  BILITOT 0.3   Active Problems:   Acute GI bleeding   Melena   Acute gastrojejunal anastomotic ulcer   Macrocytic anemia    LOS: 1 day   Tye Savoy ,NP 09/30/2017, 9:36 AM

## 2017-09-30 NOTE — Discharge Summary (Signed)
Physician Discharge Summary  Brittany [name]  KDX:[id number]  DOB: 02-06-1966  DOA: 09/28/2017 PCP: Golden Circle, FNP  Admit date: 09/28/2017 Discharge date: 09/30/2017  Admitted From: Home  Disposition: Home   Recommendations for Outpatient Follow-up:  1. Follow up with PCP in 1 week 2. Please obtain CBC in one week to monitor hemoglobin 3. Follow-up with GI Dr. Fuller Plan in 1 month - they are arranging appointment 4. Avoid Goody powders/aspirin and NSAIDs.  Discharge Condition: Stable CODE STATUS: Full code Diet recommendation:  Regular   Brief/Interim Summary: For full details see H&P/Progress note, but in brief, Brittany [name] is a 52 year old female with history of Roux-en-Y gastric bypass, presented to the emergency department complaining of generalized weakness and syncopal episode.  Associated symptoms or melena. Upon ED evaluation was found to have hemoglobin 7.7 with FOBT positive.  Patient report taking heavy use of ibuprofen/goody powders over the past year. Patient was admitted with working diagnosis of GI bleed symptomatic anemia.  Patient underwent EGD which showed LA grade B reflux, esophageal gastritis, esophageal stenosis to clean base ulceration and small hiatal hernia.  Patient was transfused total of 2 units of PRBC with cream to respond on hemoglobin.  Procedure patient was observed and hemoglobin was monitored and remained stable posttransfusion.  Patient has clinically improved and feeling much stronger.  Patient deemed stable for discharge and follow-up as an outpatient with PCP and GI.  Subjective: Seen and examined, feeling more energetic and strong.  Tolerating diet well.  Has not have any bloody bowel movement.  Remains afebrile.  No acute events overnight.  Ambulating with no issues.  Discharge Diagnoses/Hospital Course:  Symptomatic anemia in setting of GI bleed Initial hemoglobin  7.7, subsequently went down to 6.8. FOBT + Patient was transfused 2 units of PRBC  during hospital stay, hemoglobin upon discharge 9.7. Continue iron supplementation, check CBC in 1 week.  Follow-up with PCP  GI bleed GI was consulted she is status post  EGD which showed LA grade B reflux, esophageal gastritis, esophageal stenosis Roxanne Y with 2 clean base ulceration and small hiatal hernia.  Findings consistent with NSAID gastritis. Advised Protonix 40 mg twice daily indefinitely Long-term anti-for reflux, avoid NSAID use, aspirin and alcohol. Follow up with GI in 1 month   Chronic anxiety and depression Continue Cymbalta, Seroquel and as needed Ativan  All other chronic medical condition were stable during the hospitalization.  On the day of the discharge the patient's vitals were stable, and no other acute medical condition were reported by patient. the patient was felt safe to be discharge to home.   Discharge Instructions  You were cared for by a hospitalist during your hospital stay. If you have any questions about your discharge medications or the care you received while you were in the hospital after you are discharged, you can call the unit and asked to speak with the hospitalist on call if the hospitalist that took care of you is not available. Once you are discharged, your primary care physician will handle any further medical issues. Please note that NO REFILLS for any discharge medications will be authorized once you are discharged, as it is imperative that you return to your primary care physician (or establish a relationship with a primary care physician if you do not have one) for your aftercare needs so that they can reassess your need for medications and monitor your lab values.  Discharge Instructions    Call MD for:  difficulty breathing,  headache or visual disturbances   Complete by:  As directed    Call MD for:  extreme fatigue   Complete by:  As directed    Call MD for:  hives   Complete by:  As directed    Call MD for:  persistant dizziness or  light-headedness   Complete by:  As directed    Call MD for:  persistant nausea and vomiting   Complete by:  As directed    Call MD for:  redness, tenderness, or signs of infection (pain, swelling, redness, odor or green/yellow discharge around incision site)   Complete by:  As directed    Call MD for:  severe uncontrolled pain   Complete by:  As directed    Call MD for:  temperature >100.4   Complete by:  As directed    Diet - low sodium heart healthy   Complete by:  As directed    Discharge instructions   Complete by:  As directed    AVOID NSAID, Goody powder, Aspirin and Alcohol  Follow up with GI in 1 month  Follow up with primary care doctor in 1 week   Increase activity slowly   Complete by:  As directed      Allergies as of 09/30/2017      Reactions   Contrast Media [iodinated Diagnostic Agents] Hives   Amoxicillin Hives   .Marland KitchenHas patient had a PCN reaction causing immediate rash, facial/tongue/throat swelling, SOB or lightheadedness with hypotension: Yes Has patient had a PCN reaction causing severe rash involving mucus membranes or skin necrosis: No Has patient had a PCN reaction that required hospitalization No Has patient had a PCN reaction occurring within the last 10 years: No If all of the above answers are "NO", then may proceed with Cephalosporin use.   Bactrim [sulfamethoxazole-trimethoprim] Hives, Rash   Lamictal [lamotrigine] Hives, Rash      Medication List    STOP taking these medications   Ibuprofen-Famotidine 800-26.6 MG Tabs   IRON PO     TAKE these medications   calcium-vitamin D 500-200 MG-UNIT tablet Commonly known as:  OSCAL WITH D Take 3 tablets by mouth every morning. For bone health   cholecalciferol 1000 units tablet Commonly known as:  VITAMIN D Take 1 tablet (1,000 Units total) by mouth daily. For bone health   DULoxetine 60 MG capsule Commonly known as:  CYMBALTA Take 1 capsule (60 mg total) by mouth 2 (two) times daily.   iron  polysaccharides 150 MG capsule Commonly known as:  NIFEREX Take 1 capsule (150 mg total) by mouth 2 (two) times daily.   loratadine 10 MG tablet Commonly known as:  CLARITIN Take 10 mg by mouth daily. What changed:  Another medication with the same name was removed. Continue taking this medication, and follow the directions you see here.   LORazepam 0.5 MG tablet Commonly known as:  ATIVAN Take 1 tablet (0.5 mg total) by mouth daily as needed for anxiety.   multivitamin with minerals tablet Take 1 tablet by mouth daily. For low vitamin   pantoprazole 40 MG tablet Commonly known as:  PROTONIX Take 1 tablet (40 mg total) by mouth 2 (two) times daily.   QUEtiapine 50 MG tablet Commonly known as:  SEROQUEL Take 3 tablets (150 mg total) by mouth at bedtime.   vitamin B-12 500 MCG tablet Commonly known as:  CYANOCOBALAMIN Take 1 tablet (500 mcg total) by mouth daily. For Vitamin B-12 replacement   vitamin E 1000  UNIT capsule Take 1,000 Units by mouth daily.      Follow-up Information    Golden Circle, FNP. Schedule an appointment as soon as possible for a visit in 1 week(s).   Specialties:  Family Medicine, Infectious Diseases Why:  Hospital follow up Contact information: [address] [place] [address] [phone]          Allergies  Allergen Reactions  . Contrast Media [Iodinated Diagnostic Agents] Hives  . Amoxicillin Hives    .Marland KitchenHas patient had a PCN reaction causing immediate rash, facial/tongue/throat swelling, SOB or lightheadedness with hypotension: Yes Has patient had a PCN reaction causing severe rash involving mucus membranes or skin necrosis: No Has patient had a PCN reaction that required hospitalization No Has patient had a PCN reaction occurring within the last 10 years: No If all of the above answers are "NO", then may proceed with Cephalosporin use.   . Bactrim [Sulfamethoxazole-Trimethoprim] Hives and Rash  . Lamictal [Lamotrigine] Hives  and Rash    Consultations: GI   Procedures/Studies: No results found.     Discharge Exam: Vitals:   09/29/17 2159 09/30/17 0508  BP: 139/73 134/70  Pulse: 70 71  Resp: 16 14  Temp: 98.1 F (36.7 C) 98 F (36.7 C)  SpO2: 97% 98%   Vitals:   09/29/17 1141 09/29/17 1355 09/29/17 2159 09/30/17 0508  BP: (!) 157/72 118/65 139/73 134/70  Pulse: 64 (!) 52 70 71  Resp: 16 14 16 14   Temp: 98.4 F (36.9 C) 98.6 F (37 C) 98.1 F (36.7 C) 98 F (36.7 C)  TempSrc: Oral Oral Oral Oral  SpO2: 100% 96% 97% 98%  Weight:      Height:        General: Pt is alert, awake, not in acute distress Cardiovascular: RRR, S1/S2 +, no rubs, no gallops Respiratory: CTA bilaterally, no wheezing, no rhonchi Abdominal: Soft, NT, ND, bowel sounds + Extremities: no edema, no cyanosis   The results of significant diagnostics from this hospitalization (including imaging, microbiology, ancillary and laboratory) are listed below for reference.     Microbiology: No results found for this or any previous visit (from the past 240 hour(s)).   Labs: BNP (last 3 results) No results for input(s): BNP in the last 8760 hours. Basic Metabolic Panel: Recent Labs  Lab 09/28/17 1514 09/29/17 0419  NA 142 144  K 3.7 3.6  CL 109 111  CO2 27 27  GLUCOSE 93 87  BUN 18 13  CREATININE 0.58 0.59  CALCIUM 8.5* 8.3*  MG  --  2.0   Liver Function Tests: Recent Labs  Lab 09/28/17 1514  AST 12*  ALT 15  ALKPHOS 68  BILITOT 0.3  PROT 6.0*  ALBUMIN 3.3*   No results for input(s): LIPASE, AMYLASE in the last 168 hours. No results for input(s): AMMONIA in the last 168 hours. CBC: Recent Labs  Lab 09/28/17 1514 09/29/17 0419 09/29/17 1602 09/30/17 0416  WBC 6.1 4.1  --  4.4  HGB 7.7* 6.8* 9.8* 9.7*  HCT 23.4* 20.4* 28.7* 27.9*  MCV 100.9* 100.5*  --  93.9  PLT 330 275  --  312   Cardiac Enzymes: No results for input(s): CKTOTAL, CKMB, CKMBINDEX, TROPONINI in the last 168  hours. BNP: Invalid input(s): POCBNP CBG: No results for input(s): GLUCAP in the last 168 hours. D-Dimer No results for input(s): DDIMER in the last 72 hours. Hgb A1c No results for input(s): HGBA1C in the last 72 hours. Lipid  Profile No results for input(s): CHOL, HDL, LDLCALC, TRIG, CHOLHDL, LDLDIRECT in the last 72 hours. Thyroid function studies No results for input(s): TSH, T4TOTAL, T3FREE, THYROIDAB in the last 72 hours.  Invalid input(s): FREET3 Anemia work up Recent Labs    09/28/17 1655 09/29/17 0419  VITAMINB12 1,833* 1,551*  FOLATE 30.8  --    Urinalysis    Component Value Date/Time   COLORURINE YELLOW (A) 11/05/2014 1614   APPEARANCEUR CLEAR (A) 11/05/2014 1614   LABSPEC 1.009 11/05/2014 1614   PHURINE 6.0 11/05/2014 1614   GLUCOSEU NEGATIVE 11/05/2014 1614   HGBUR NEGATIVE 11/05/2014 1614   [place] NEGATIVE 11/05/2014 1614   KETONESUR NEGATIVE 11/05/2014 1614   PROTEINUR NEGATIVE 11/05/2014 1614   NITRITE NEGATIVE 11/05/2014 1614   LEUKOCYTESUR NEGATIVE 11/05/2014 1614   Sepsis Labs Invalid input(s): PROCALCITONIN,  WBC,  LACTICIDVEN Microbiology No results found for this or any previous visit (from the past 240 hour(s)).   Time coordinating discharge: 32 minutes  SIGNED:  Chipper Oman, MD  [facility] 09/30/2017, 9:09 AM  Pager please text [place] via  [email or web address]  Note - This record has been created using [employer]. Chart creation errors have been sought, but may not always have been located. Such creation errors do not reflect on the standard of medical care.

## 2017-10-21 ENCOUNTER — Ambulatory Visit (INDEPENDENT_AMBULATORY_CARE_PROVIDER_SITE_OTHER): Payer: 59 | Admitting: Psychology

## 2017-10-21 DIAGNOSIS — F331 Major depressive disorder, recurrent, moderate: Secondary | ICD-10-CM

## 2017-10-21 NOTE — Progress Notes (Signed)
   THERAPIST PROGRESS NOTE  Session Time: 11am-11.42am  Participation Level: Active  Behavioral Response: Well GroomedAlerttired  Type of Therapy: Individual Therapy  Treatment Goals addressed: Diagnosis: MDD and goal 1.  Interventions: CBT and Supportive  Summary: Brittany [name] is a 52 y.o. female who presents with affect congruent w/ report of feeling worn out.  pt reported that couple weeks ago she ended up in ED after talking w/ PCP nurse as was having black stools and energy very low.  She was admitted to hospital, received transfusions and found to have bleeding ulcers that had stopped.  Pt was released after 3 days and started on protonix.  Pt reported she was starting to feel better and visited her family in [place] over the weekend- which wore her out- black stools over weekend and just feels very fatigued again.  She goes to her doctor tomorrow and aware to go to ED if worsens.  Pt is aware of some negative self talk that has emerged- no one cares, expectations for self. Pt is able to reframe.  Pt discussed that despite being too much the visit was good.  Had to assert w/ mom what could and couldn't do and enjoyed bringing her granddaughter and visiting places that important w/ raising her daughter.   Suicidal/Homicidal: Nowithout intent/plan  Therapist Response: Assessed pt current functioning per pt report.  Processed w/pt her medical illness and impact having on her.  Explored w/pt self talk and awareness of distortions and reframing.  Discussed importance of self care.   Plan: Return again in 2 weeks. Pt to f/u w/ her appointment tomorrow w/ PCP.  Diagnosis: MDD   [name], [facility] 10/21/2017

## 2017-10-22 ENCOUNTER — Telehealth: Payer: Self-pay | Admitting: Gastroenterology

## 2017-10-22 ENCOUNTER — Encounter: Payer: Self-pay | Admitting: Family

## 2017-10-22 ENCOUNTER — Ambulatory Visit (INDEPENDENT_AMBULATORY_CARE_PROVIDER_SITE_OTHER): Payer: 59 | Admitting: Family

## 2017-10-22 ENCOUNTER — Other Ambulatory Visit (INDEPENDENT_AMBULATORY_CARE_PROVIDER_SITE_OTHER): Payer: 59

## 2017-10-22 VITALS — BP 124/84 | HR 67 | Temp 97.9°F | Ht 70.0 in | Wt 264.1 lb

## 2017-10-22 DIAGNOSIS — R195 Other fecal abnormalities: Secondary | ICD-10-CM

## 2017-10-22 LAB — CBC WITH DIFFERENTIAL/PLATELET
BASOS PCT: 0.1 % (ref 0.0–3.0)
Basophils Absolute: 0 10*3/uL (ref 0.0–0.1)
EOS ABS: 0.3 10*3/uL (ref 0.0–0.7)
EOS PCT: 5.3 % — AB (ref 0.0–5.0)
HCT: 36 % (ref 36.0–46.0)
HEMOGLOBIN: 12.2 g/dL (ref 12.0–15.0)
LYMPHS PCT: 31.2 % (ref 12.0–46.0)
Lymphs Abs: 1.7 10*3/uL (ref 0.7–4.0)
MCHC: 33.7 g/dL (ref 30.0–36.0)
MCV: 97.3 fl (ref 78.0–100.0)
MONO ABS: 0.4 10*3/uL (ref 0.1–1.0)
MONOS PCT: 7.3 % (ref 3.0–12.0)
NEUTROS ABS: 3 10*3/uL (ref 1.4–7.7)
Neutrophils Relative %: 56.1 % (ref 43.0–77.0)
Platelets: 330 10*3/uL (ref 150.0–400.0)
RBC: 3.7 Mil/uL — AB (ref 3.87–5.11)
RDW: 15 % (ref 11.5–15.5)
WBC: 5.4 10*3/uL (ref 4.0–10.5)

## 2017-10-22 NOTE — Telephone Encounter (Signed)
Brittany [name], PCP is requesting pt to be seen sooner than 10/30/17 due to dark stools. Pt is scheduled to see Nevin Bloodgood on that day. Please see referral and advise. Thank you

## 2017-10-22 NOTE — Progress Notes (Signed)
Brittany [name] is a 52 y.o. female with the following history as recorded in EpicCare:  Patient Active Problem List   Diagnosis Date Noted  . Melena   . Acute gastrojejunal anastomotic ulcer   . Macrocytic anemia   . Acute GI bleeding 09/28/2017  . Epigastric abdominal pain 11/19/2016  . Incisional hernia with obstruction but no gangrene 07/21/2016  . Menopause 04/22/2016  . Routine general medical examination at a health care facility 03/18/2016  . Obesity 03/18/2016  . Severe episode of recurrent major depressive disorder, without psychotic features ([place])   . Depression, major, severe recurrence ([place]) 09/11/2015  . Major depressive disorder, recurrent, severe without psychotic features ([place])   . MDD (major depressive disorder) 10/30/2014  . MDD (major depressive disorder), recurrent severe, without psychosis ([place])   . Suicidal ideation 10/05/2014  . Depression 06/12/2014  . Rash 01/04/2014  . Normocytic anemia 01/04/2014  . DJD (degenerative joint disease) 01/04/2014  . Hypertension 01/04/2014  . Dyslipidemia 01/04/2014  . GERD (gastroesophageal reflux disease) 01/04/2014  . Obstructive sleep apnea 01/04/2014  . Status post small bowel resection 01/04/2014  . Abdominal abscess 01/01/2014  . Wound infection after surgery 01/01/2014  . PTSD (post-traumatic stress disorder) 11/23/2013  . Severe recurrent major depression without psychotic features ([place]) 11/22/2013  . 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 Morbid obesity, Weight - 312, BMI - 45.2 04/21/2012    Current Outpatient Medications  Medication Sig Dispense Refill  . calcium-vitamin D (OSCAL WITH D) 500-200 MG-UNIT tablet Take 3 tablets by mouth every morning. For bone health    . cholecalciferol (VITAMIN D) 1000 units tablet Take 1 tablet (1,000 Units total) by mouth daily. For bone health    . DULoxetine (CYMBALTA) 60 MG capsule Take 1 capsule (60 mg total) by mouth 2 (two) times daily. 180 capsule  0  . iron polysaccharides (NIFEREX) 150 MG capsule Take 1 capsule (150 mg total) by mouth 2 (two) times daily. 60 capsule 0  . loratadine (CLARITIN) 10 MG tablet Take 10 mg by mouth daily.    Marland Kitchen LORazepam (ATIVAN) 0.5 MG tablet Take 1 tablet (0.5 mg total) by mouth daily as needed for anxiety. 20 tablet 0  . Multiple Vitamins-Minerals (MULTIVITAMIN WITH MINERALS) tablet Take 1 tablet by mouth daily. For low vitamin    . pantoprazole (PROTONIX) 40 MG tablet Take 1 tablet (40 mg total) by mouth 2 (two) times daily. 60 tablet 1  . QUEtiapine (SEROQUEL) 50 MG tablet Take 3 tablets (150 mg total) by mouth at bedtime. 90 tablet 2  . vitamin B-12 (CYANOCOBALAMIN) 500 MCG tablet Take 1 tablet (500 mcg total) by mouth daily. For Vitamin B-12 replacement    . vitamin E 1000 UNIT capsule Take 1,000 Units by mouth daily.     No current facility-administered medications for this visit.     Allergies: Contrast media [iodinated diagnostic agents]; Amoxicillin; Bactrim [sulfamethoxazole-trimethoprim]; and Lamictal [lamotrigine]  Past Medical History:  Diagnosis Date  . Allergy   . Anxiety   . Arthritis    hips, knees and hands   . Depression   . GERD (gastroesophageal reflux disease)    hx of   . H/O hiatal hernia   . History of chicken pox   . Hypercholesterolemia   . Hypertension   . Migraine   . Migraines   . Morbid obesity ([place])   . Positive TB test   . PTSD (post-traumatic stress disorder)   . Sleep apnea  no CPAP machine     Past Surgical History:  Procedure Laterality Date  . ABDOMINAL HYSTERECTOMY  1997 & 2006  . BREATH TEK H PYLORI  05/03/2012   Procedure: BREATH TEK H PYLORI;  Surgeon: [name], MD;  Location: [name];  Service: General;  Laterality: N/A;  . COLONOSCOPY     hx of benign polyps   . ESOPHAGOGASTRODUODENOSCOPY N/A 09/29/2017   Procedure: ESOPHAGOGASTRODUODENOSCOPY (EGD);  Surgeon: Ladene Artist, MD;  Location: [name];  Service: Endoscopy;   Laterality: N/A;  . FOOT SURGERY    . GASTRIC BYPASS    . GASTRIC ROUX-EN-Y N/A 07/05/2012   Procedure: LAPAROSCOPIC ROUX-EN-Y GASTRIC;  Surgeon: [name], MD;  Location: [facility];  Service: General;  Laterality: N/A;  . IRRIGATION AND DEBRIDEMENT ABSCESS N/A 01/01/2014   Procedure: IRRIGATION AND DEBRIDEMENT ABSCESS;  Surgeon: [name], MD;  Location: [facility];  Service: General;  Laterality: N/A;  . LAPAROSCOPIC LYSIS OF ADHESIONS N/A 07/05/2012   Procedure: LAPAROSCOPIC LYSIS OF ADHESIONS;  Surgeon: [name], MD;  Location: [facility];  Service: General;  Laterality: N/A;  repair of abdominal wall hernia  . right tube and ovary removed     . UPPER GI ENDOSCOPY N/A 07/05/2012   Procedure: UPPER GI ENDOSCOPY;  Surgeon: [name], MD;  Location: [facility];  Service: General;  Laterality: N/A;    Family History  Problem Relation Age of Onset  . Colon cancer Mother 73  . Depression Father   . Diabetes Father   . Heart disease Father   . Bladder Cancer Father   . Depression Sister   . Post-traumatic stress disorder Sister   . Alcohol abuse Paternal Uncle   . Bipolar disorder Cousin   . Anxiety disorder Sister   . Cancer Maternal Aunt        breast  . Melanoma Maternal Grandmother   . Colon cancer Maternal Grandmother        does not know age of onset  . Healthy Maternal Grandfather   . Healthy Paternal Grandmother   . Heart disease Paternal Grandfather   . Stroke Paternal Grandfather   . Diabetes Paternal Grandfather     Social History   Tobacco Use  . Smoking status: Former Smoker    Packs/day: 0.10    Years: 1.00    Pack years: 0.10    Types: Cigarettes    Last attempt to quit: 04/27/2004    Years since quitting: 13.4  . Smokeless tobacco: Never Used  . Tobacco comment: Smokes one at times.   Substance Use Topics  . Alcohol use: No    Alcohol/week: 0.0 oz    Comment: Occasional use/ 2 drinks a month    Subjective:  Patient was hospitalized on 09/28/2017  with gastrointestinal hemorrhage secondary to ulcers at previous surgical site; patient notes she felt really good after discharge from hospital initially; was in hospital from 6/17-6/19 and received 2 units; felt good for about 1 week after discharge but feels like fatigue is returning; noticed 3-4 episodes of dark stools over the weekend; denies any vomiting or coffee grounds emesis; is taking Protonix 40 mg bid; scheduled to see GI next week in follow-up- has not contacted their office about the dark stool; denies any abdominal pain;  Will be seeing her GYN in follow-up in the next few weeks for yearly CPE; does need to get mammogram and labs updated but defers scheduling today;   Objective:  Vitals:  10/22/17 1333  BP: 124/84  Pulse: 67  Temp: 97.9 F (36.6 C)  TempSrc: Oral  SpO2: 96%  Weight: 264 lb 1.3 oz (119.8 kg)  Height: 5\' 10"  (1.778 m)    General: Well developed, well nourished, in no acute distress  Skin : Warm and dry.  Head: Normocephalic and atraumatic  Eyes: Sclera and conjunctiva clear; pupils round and reactive to light; extraocular movements intact  Lungs: Respirations unlabored; clear to auscultation bilaterally without wheeze, rales, rhonchi  CVS exam: normal rate and regular rhythm.  Abdomen: Soft; nontender; nondistended; normoactive bowel sounds; no masses or hepatosplenomegaly  Neurologic: Alert and oriented; speech intact; face symmetrical; moves all extremities well; CNII-XII intact without focal deficit  Assessment:  1. Dark stools     Plan:  Concern for recurrent symptoms as patient was just hospitalized with GI bleed; will try to get her seen at GI as a STAT referral; update CBC today to make sure hemoglobin has remained stable; if patient is unable to see GI before next week, may need to consider update abd/ pelvic CT; follow-up to be determined.   No follow-ups on file.  Orders Placed This Encounter  Procedures  . CBC w/Diff    Standing Status:    Future    Number of Occurrences:   1    Standing Expiration Date:   10/22/2018  . Ambulatory referral to Gastroenterology    Referral Priority:   Urgent    Referral Type:   Consultation    Referral Reason:   Specialty Services Required    Number of Visits Requested:   1    Requested Prescriptions    No prescriptions requested or ordered in this encounter

## 2017-10-22 NOTE — Telephone Encounter (Signed)
No earlier appts at this time.  Patient's Hgb is normal.  Per results today.  Can add her to the cancellation list.

## 2017-10-26 NOTE — Telephone Encounter (Signed)
Pt added to cancellation list. Called PCP to inform but line was busy.

## 2017-10-30 ENCOUNTER — Encounter: Payer: Self-pay | Admitting: Nurse Practitioner

## 2017-10-30 ENCOUNTER — Encounter

## 2017-10-30 ENCOUNTER — Ambulatory Visit (INDEPENDENT_AMBULATORY_CARE_PROVIDER_SITE_OTHER): Payer: 59 | Admitting: Nurse Practitioner

## 2017-10-30 VITALS — BP 120/72 | HR 78 | Ht 70.0 in | Wt 263.0 lb

## 2017-10-30 DIAGNOSIS — K289 Gastrojejunal ulcer, unspecified as acute or chronic, without hemorrhage or perforation: Secondary | ICD-10-CM | POA: Diagnosis not present

## 2017-10-30 DIAGNOSIS — D62 Acute posthemorrhagic anemia: Secondary | ICD-10-CM

## 2017-10-30 NOTE — Patient Instructions (Addendum)
If you are age 53 or older, your body mass index should be between 23-30. Your Body mass index is 37.74 kg/m. If this is out of the aforementioned range listed, please consider follow up with your Primary Care Provider.  If you are age 32 or younger, your body mass index should be between 19-25. Your Body mass index is 37.74 kg/m. If this is out of the aformentioned range listed, please consider follow up with your Primary Care Provider.   Your provider has requested that you go to the basement level for lab work on 11/30/17. Press "B" on the elevator. The lab is located at the first door on the left as you exit the elevator.   Continue Pantoprazole twice daily.  Continue Iron.  NO Goody Powders.  Will call you with lab results.  Thank you for choosing me and [facility].   Tye Savoy, NP

## 2017-10-30 NOTE — Progress Notes (Signed)
      IMPRESSION and PLAN:    #82.  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 She had been taking NSAIDs.  -She has stopped all NSAIDs -Hemoglobin around 12, up from 6.8.  She is post 2 units of blood. Taking iron supplements now -Continue iron for another 4 to 6 weeks.  Will recheck CBC / ferritin and decide on whether to continue iron. She wasn't on iron prior to bleed and hgb was normal.  -will need to continue PPI BID indefinately as she also had reflux esophagitis on EGD.   #2. GERD.  Rarely gets GERD symptoms but recent EGD showed LA class B esophagitis -Continue BID PPI indefinately.   #3.  Family history of colon cancer.  Patient is up-to-date on her surveillance colonoscopy, last one February 2018 with findings of diverticulosis and hemorrhoids.  Next colonoscopy due February 2023      HPI:    Chief Complaint: hospital follow up   Patient is a 52 year old female with a history of Roux-en-Y.  Hospitalized mid June with upper GI bleed. 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 She received 2 units of blood. Hemoglobin fell from baseline of 12.2 to 6.8.  By time of discharge hemoglobin up to 9.7. She is taking iron and twice daily Protonix. Felt lousy last week but attributes to the heat and a drive to [place] to [place] family. Feels much better this week. Repeat hgb last week was up to 12.2, MCV 97. Tolerating iron.  Appetite is fine, no nausea, vomiting or bowel changes  Review of systems:     No chest pain, no SOB, no fevers, no urinary sx   Past Medical History:  Diagnosis Date  . Allergy   . Anxiety   . Arthritis    hips, knees and hands   . Depression   . GERD (gastroesophageal reflux disease)     hx of   . H/O hiatal hernia   . History of chicken pox   . Hypercholesterolemia   . Hypertension   . Migraine   . Migraines   . Morbid obesity ([place])   . Positive TB test   . PTSD (post-traumatic stress disorder)   . Sleep apnea    no CPAP machine     Patient's surgical history, family medical history, social history, medications and allergies were all reviewed in Epic   Creatinine clearance cannot be calculated (Patient's most recent lab result is older than the maximum 21 days allowed.)   Physical Exam:     BP 120/72   Pulse 78   Ht 5\' 10"  (1.778 m)   Wt 263 lb (119.3 kg)   BMI 37.74 kg/m    GENERAL:  Pleasant female in NAD PSYCH: : Cooperative, normal affect EENT:  conjunctiva pink, mucous membranes moist, neck supple without masses CARDIAC:  RRR, no murmur heard, no peripheral edema PULM: Normal respiratory effort, lungs CTA bilaterally, no wheezing ABDOMEN:  Nondistended, soft, nontender. No obvious masses, no hepatomegaly,  normal bowel sounds SKIN:  turgor, no lesions seen Musculoskeletal:  Normal muscle tone, normal strength NEURO: Alert and oriented x 3, no focal neurologic deficits   Tye Savoy , NP 10/30/2017, 9:38 AM

## 2017-11-01 NOTE — Progress Notes (Signed)
Reviewed and agree with initial management plan.  Yesika Rispoli T. Lyndy Russman, MD FACG 

## 2017-11-05 ENCOUNTER — Encounter: Payer: Self-pay | Admitting: Gynecology

## 2017-11-05 ENCOUNTER — Ambulatory Visit (INDEPENDENT_AMBULATORY_CARE_PROVIDER_SITE_OTHER): Payer: 59 | Admitting: Gynecology

## 2017-11-05 VITALS — BP 118/80 | Ht 69.0 in | Wt 267.0 lb

## 2017-11-05 DIAGNOSIS — N952 Postmenopausal atrophic vaginitis: Secondary | ICD-10-CM

## 2017-11-05 DIAGNOSIS — Z01419 Encounter for gynecological examination (general) (routine) without abnormal findings: Secondary | ICD-10-CM

## 2017-11-05 DIAGNOSIS — R21 Rash and other nonspecific skin eruption: Secondary | ICD-10-CM | POA: Diagnosis not present

## 2017-11-05 MED ORDER — NYSTATIN 100000 UNIT/GM EX POWD: Freq: Four times a day (QID) | CUTANEOUS | 1 refills | Status: DC

## 2017-11-05 NOTE — Progress Notes (Signed)
    Brittany [name] 10-01-1965 [id number]        52 y.o.  G0P0000 for annual gynecologic exam.  Former patient of Dr. Toney Rakes.  Without gynecologic complaints.  Past medical history,surgical history, problem list, medications, allergies, family history and social history were all reviewed and documented as reviewed in the EPIC chart.  ROS:  Performed with pertinent positives and negatives included in the history, assessment and plan.   Additional significant findings : None   Exam: Brittany [name] assistant Vitals:   11/05/17 1556  BP: 118/80  Weight: 267 lb (121.1 kg)  Height: 5\' 9"  (1.753 m)   Body mass index is 39.43 kg/m.  General appearance:  Normal affect, orientation and appearance. Skin: Grossly normal excepting rash in lower abdomen consistent with fungal HEENT: Without gross lesions.  No cervical or supraclavicular adenopathy. Thyroid normal.  Lungs:  Clear without wheezing, rales or rhonchi Cardiac: RR, without RMG Abdominal:  Soft, nontender, without masses, guarding, rebound, organomegaly or hernia Breasts:  Examined lying and sitting without masses, retractions, discharge or axillary adenopathy. Pelvic:  Ext, BUS, Vagina: With atrophic changes  Cervix: With atrophic changes flush with upper vagina  Adnexa: Without masses or tenderness    Anus and perineum: Normal   Rectovaginal: Normal sphincter tone without palpated masses or tenderness.    Assessment/Plan:  52 y.o. G0P0000 female for annual gynecologic exam status post supracervical hysterectomy and separate BSO.  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  1. Postmenopausal.  Transiently been on HRT in the past but has discontinued.  Doing well without significant menopausal symptoms.  No vaginal bleeding. 2. Rash in lower abdomen consistent with fungal.  Is using OTC antifungal cream.  Nystatin powder provided to have for you as needed use.  60 g with 1 refill 3. Mammography  overdue and patient knows to call and schedule.  Breast exam normal today. 4. Pap smear 2018.  No Pap smear done today.  No history of significant abnormal Pap smears.  Plan repeat Pap smear at 3-year interval per current screening guidelines. 5. Colonoscopy 2018.  Repeat at their recommended interval. 6. DEXA never.  Will plan further into the menopause. 7. Health maintenance.  No routine lab work done as patient does this elsewhere.  Follow-up 1 year, sooner as needed.   Anastasio Auerbach MD, 4:45 PM 11/05/2017

## 2017-11-05 NOTE — Patient Instructions (Signed)
Try the prescribed yeast powder to see if this does not help with the abdominal rash.  Follow-up in 1 year for annual exam, sooner as needed.

## 2017-11-11 ENCOUNTER — Ambulatory Visit (INDEPENDENT_AMBULATORY_CARE_PROVIDER_SITE_OTHER): Payer: 59 | Admitting: Psychology

## 2017-11-11 DIAGNOSIS — F331 Major depressive disorder, recurrent, moderate: Secondary | ICD-10-CM | POA: Diagnosis not present

## 2017-11-11 NOTE — Progress Notes (Signed)
   THERAPIST PROGRESS NOTE  Session Time: 3.20pm-4.08pm  Participation Level: Active  Behavioral Response: Well GroomedAlertaffect wnl  Type of Therapy: Individual Therapy  Treatment Goals addressed: Diagnosis: MDD and goal 1.  Interventions: CBT and Supportive  Summary: Brittany [name] is a 52 y.o. female who presents with affect wnl.  Pt reported that she is recovering from ulcers and internal bleeding.  Pt reported her energy level is improving- but still has some days that she is worn out. Pt reported on upcoming vacation at beach for couple days w/ family.  Pt identified that she needs to practice boundaries of what sis good for her and not pulled along w/ family.  Pt discussed awareness that will be tempting to have a drink at the beach but not good w/her meds.  Pt identified other ways to have special nonalcoholic drink to enjoy.  Pt discussed that she had gotten out of practice of daily reflection and journaling and was slipping back into negative thought patterns and ruminating.  Pt has restarted over past week her practice and identified as beneficial.  Pt reported she end dating relationship as recognized it had "run its course" but felt good about doing so.     Suicidal/Homicidal: Nowithout intent/plan  Therapist Response: Assessed pt current functioning per pt report. Processed w/pt planning for beach trip w/ family and how to be intentional about what is healthy for her.  Discussed awareness and insight into coping skills and benefit that they provide and being accountable.   Plan: Return again in 2 weeks.  Diagnosis: MDD  [name], [facility] 11/11/2017

## 2017-11-26 ENCOUNTER — Other Ambulatory Visit (HOSPITAL_COMMUNITY): Payer: Self-pay | Admitting: Psychiatry

## 2017-11-26 DIAGNOSIS — F4310 Post-traumatic stress disorder, unspecified: Secondary | ICD-10-CM

## 2017-11-26 DIAGNOSIS — F332 Major depressive disorder, recurrent severe without psychotic features: Secondary | ICD-10-CM

## 2017-12-01 ENCOUNTER — Ambulatory Visit (INDEPENDENT_AMBULATORY_CARE_PROVIDER_SITE_OTHER): Payer: 59 | Admitting: Psychiatry

## 2017-12-01 ENCOUNTER — Encounter (HOSPITAL_COMMUNITY): Payer: Self-pay | Admitting: Psychiatry

## 2017-12-01 VITALS — BP 136/72 | HR 75 | Ht 70.0 in | Wt 268.0 lb

## 2017-12-01 DIAGNOSIS — F410 Panic disorder [episodic paroxysmal anxiety] without agoraphobia: Secondary | ICD-10-CM | POA: Diagnosis not present

## 2017-12-01 DIAGNOSIS — F3341 Major depressive disorder, recurrent, in partial remission: Secondary | ICD-10-CM | POA: Diagnosis not present

## 2017-12-01 DIAGNOSIS — F4310 Post-traumatic stress disorder, unspecified: Secondary | ICD-10-CM | POA: Diagnosis not present

## 2017-12-01 DIAGNOSIS — Z5181 Encounter for therapeutic drug level monitoring: Secondary | ICD-10-CM | POA: Diagnosis not present

## 2017-12-01 MED ORDER — DULOXETINE HCL 60 MG PO CPEP: 60.0000 mg | ORAL_CAPSULE | Freq: Two times a day (BID) | ORAL | 0 refills | Status: DC
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
## 2017-12-02 ENCOUNTER — Ambulatory Visit (INDEPENDENT_AMBULATORY_CARE_PROVIDER_SITE_OTHER): Payer: 59 | Admitting: Psychology

## 2017-12-02 DIAGNOSIS — F4310 Post-traumatic stress disorder, unspecified: Secondary | ICD-10-CM

## 2017-12-02 DIAGNOSIS — F3341 Major depressive disorder, recurrent, in partial remission: Secondary | ICD-10-CM

## 2017-12-02 NOTE — Progress Notes (Signed)
   THERAPIST PROGRESS NOTE  Session Time: 10am-10.45am  Participation Level: Active  Behavioral Response: Well GroomedAlertaffect wnl  Type of Therapy: Individual Therapy  Treatment Goals addressed: Diagnosis: MDD and goal 1.  Interventions: CBT and Supportive  Summary: Brittany [name] is a 52 y.o. female who presents with affect bright.  pt reported that she has been doing well- medically she feels she has recovered w/ her ulcers.  Pt reported that her mood has been good- only a couple down days in past several weeks.  Pt reported that her vacation w/ family went well and felt empowered that she was able to assert not going to dinner w/ everyone.  Pt expressed that she did drink on vacation and did feel that interfere w/ medication and resulted in couple days of down mood after.  Pt was able to not put self down about, aware of impact and cope through.  Pt discussed how she is feeling more strength and growth in self and how she has been able to do things for self, end relationship that not healthy, etc.  Pt also reported that daughter did respond recently to a text- first time in almost a year and not trying to over think it- but just accept.    Suicidal/Homicidal: Nowithout intent/plan  Therapist Response: Assessed pt current functioning per pt report. Processed w/pt interactions w/ family and explored w/ pt her growth.  Reflecting growth and assertiveness.    Plan: Return again in 2 weeks.  Diagnosis: MDD   [name], [facility] 12/02/2017

## 2017-12-03 ENCOUNTER — Telehealth: Payer: Self-pay | Admitting: Nurse Practitioner

## 2017-12-03 ENCOUNTER — Other Ambulatory Visit: Payer: Self-pay

## 2017-12-03 ENCOUNTER — Other Ambulatory Visit: Payer: Self-pay | Admitting: Gynecology

## 2017-12-03 DIAGNOSIS — Z1231 Encounter for screening mammogram for malignant neoplasm of breast: Secondary | ICD-10-CM

## 2017-12-03 MED ORDER — PANTOPRAZOLE SODIUM 40 MG PO TBEC: 40.0000 mg | DELAYED_RELEASE_TABLET | Freq: Two times a day (BID) | ORAL | 2 refills | Status: DC

## 2017-12-29 ENCOUNTER — Ambulatory Visit
Admission: RE | Admit: 2017-12-29 | Discharge: 2017-12-29 | Disposition: A | Discharge status: Home / Self Care | Payer: 59 | Source: Ambulatory Visit | Attending: Gynecology | Admitting: Gynecology

## 2017-12-29 ENCOUNTER — Ambulatory Visit (INDEPENDENT_AMBULATORY_CARE_PROVIDER_SITE_OTHER): Payer: 59 | Admitting: Psychology

## 2017-12-29 DIAGNOSIS — Z1231 Encounter for screening mammogram for malignant neoplasm of breast: Secondary | ICD-10-CM

## 2017-12-29 DIAGNOSIS — F3341 Major depressive disorder, recurrent, in partial remission: Secondary | ICD-10-CM | POA: Diagnosis not present

## 2017-12-29 NOTE — Progress Notes (Signed)
   THERAPIST PROGRESS NOTE  Session Time: 12.30pm-1.18pm  Participation Level: Active  Behavioral Response: Well GroomedAlertaffect bright  Type of Therapy: Individual Therapy  Treatment Goals addressed: Diagnosis: MDD and goal 1.   Interventions: CBT and Supportive  Summary: Brittany [name] is a 52 y.o. female who presents with affect full and bright.  Pt reported that she has been doing well w/ mood over past several weeks.  Pt reported that she did go to visit home and present for bestfriend's parents's memorial.  Pt discussed how this was difficult, sad, but also good.  Pt reported on time w/ family as positive as well.  Pt discussed how she has been busy w/ home projects, prepared guest room for visits and took on plumbing project that was very frustrating.  Pt discussed how through the challenge she did get very irritated but was able to use coping skills to manage through w/out incident and felt accomplished in the end.  Pt reports she is dating a new guy and is taking very slow- has had 3 dates and is interested in getting to know slowly and he agrees.  Pt discussed upcoming plans w/ work, home, and family.     Suicidal/Homicidal: Nowithout intent/plan  Therapist Response: Assessed pt current functioning per pt report. Explored w/pt interactions w/ family and friends.  Discussed how pt has been staying busy but also managing for self care. Processed w/pt some frustrations and reflected how handled well w/ coping skills.   Plan: Return again in 3 weeks.  Diagnosis: MDD   [name], [facility] 12/29/2017

## 2018-01-18 ENCOUNTER — Ambulatory Visit (HOSPITAL_COMMUNITY): Payer: Self-pay | Admitting: Psychology

## 2018-01-28 ENCOUNTER — Telehealth: Payer: Self-pay | Admitting: Nurse Practitioner

## 2018-01-28 ENCOUNTER — Other Ambulatory Visit (INDEPENDENT_AMBULATORY_CARE_PROVIDER_SITE_OTHER): Payer: 59

## 2018-01-28 DIAGNOSIS — K289 Gastrojejunal ulcer, unspecified as acute or chronic, without hemorrhage or perforation: Secondary | ICD-10-CM | POA: Diagnosis not present

## 2018-01-28 DIAGNOSIS — D62 Acute posthemorrhagic anemia: Secondary | ICD-10-CM | POA: Diagnosis not present

## 2018-01-28 LAB — IBC PANEL
IRON: 137 ug/dL (ref 42–145)
Saturation Ratios: 32.8 % (ref 20.0–50.0)
TRANSFERRIN: 298 mg/dL (ref 212.0–360.0)

## 2018-01-28 LAB — CBC
HCT: 38.6 % (ref 36.0–46.0)
HEMOGLOBIN: 13 g/dL (ref 12.0–15.0)
MCHC: 33.7 g/dL (ref 30.0–36.0)
MCV: 97 fl (ref 78.0–100.0)
Platelets: 343 10*3/uL (ref 150.0–400.0)
RBC: 3.98 Mil/uL (ref 3.87–5.11)
RDW: 14 % (ref 11.5–15.5)
WBC: 7.9 10*3/uL (ref 4.0–10.5)

## 2018-01-28 LAB — FERRITIN: FERRITIN: 57 ng/mL (ref 10.0–291.0)

## 2018-01-28 NOTE — Telephone Encounter (Signed)
Patient aware.

## 2018-02-08 ENCOUNTER — Ambulatory Visit (INDEPENDENT_AMBULATORY_CARE_PROVIDER_SITE_OTHER): Payer: 59 | Admitting: Psychology

## 2018-02-08 DIAGNOSIS — F3341 Major depressive disorder, recurrent, in partial remission: Secondary | ICD-10-CM

## 2018-02-08 NOTE — Progress Notes (Signed)
   THERAPIST PROGRESS NOTE  Session Time: 12.30pm-1.18pm  Participation Level: Active  Behavioral Response: Well GroomedAlertaffect wnl  Type of Therapy: Individual Therapy  Treatment Goals addressed: Diagnosis: MDD and goal 1.  Interventions: CBT and Supportive  Summary: Brittany [name] is a 52 y.o. female who presents with affect wnl.  Pt reports returned from helping sister w/ move/visiting mom.  Pt is helping sister by taking care of 2 of her dogs while temporarily staying w/ mom.  Pt reported that she has had  Couple of trips to help w/ mom recovery from shoulder surgery.  Visits have gone well- interactions w/ family have been good.  Pt reported that guy she was dating and felt connecting w/ had stopped replying to her several weeks ago- pt discussed how this was hurtful, raised questions about if because shared about her depression and reminders of break up of marriage.  Pt was able to reframe about cognitive distortions aware that won't have answers and that good indicator of not who she thought he was.  Pt discussed her journaling, keeping active and taking time for rest has been beneficial.    Suicidal/Homicidal: Nowithout intent/plan  Therapist Response: Assessed pt current functioning per pt report. Processed w/pt her interactions w/ family and w/ friends.  Explored w/ pt dating interactions and validated feelings.  Assisted w/ reflecting reframes.  Explored w/pt use of healthy coping skills through stressors.  Plan: Return again in 3 weeks.  Diagnosis: MDD  [name], [facility] 02/08/2018

## 2018-02-18 ENCOUNTER — Ambulatory Visit (INDEPENDENT_AMBULATORY_CARE_PROVIDER_SITE_OTHER): Payer: 59 | Admitting: Psychiatry

## 2018-02-18 ENCOUNTER — Encounter (HOSPITAL_COMMUNITY): Payer: Self-pay | Admitting: Psychiatry

## 2018-02-18 VITALS — BP 105/69 | HR 78 | Ht 70.0 in | Wt 269.0 lb

## 2018-02-18 DIAGNOSIS — F4310 Post-traumatic stress disorder, unspecified: Secondary | ICD-10-CM | POA: Diagnosis not present

## 2018-02-18 DIAGNOSIS — F3341 Major depressive disorder, recurrent, in partial remission: Secondary | ICD-10-CM

## 2018-02-18 DIAGNOSIS — F410 Panic disorder [episodic paroxysmal anxiety] without agoraphobia: Secondary | ICD-10-CM | POA: Diagnosis not present

## 2018-02-18 MED ORDER — QUETIAPINE FUMARATE 50 MG PO TABS: ORAL_TABLET | ORAL | 2 refills | Status: DC

## 2018-02-18 MED ORDER — DULOXETINE HCL 60 MG PO CPEP: 60.0000 mg | ORAL_CAPSULE | Freq: Two times a day (BID) | ORAL | 0 refills | Status: DC
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
## 2018-02-24 ENCOUNTER — Other Ambulatory Visit: Payer: Self-pay | Admitting: Nurse Practitioner

## 2018-03-03 ENCOUNTER — Ambulatory Visit (HOSPITAL_COMMUNITY): Payer: 59 | Admitting: Psychiatry

## 2018-03-08 ENCOUNTER — Telehealth (HOSPITAL_COMMUNITY): Payer: Self-pay | Admitting: Psychology

## 2018-03-08 NOTE — Telephone Encounter (Signed)
Called and offered pt opening for 03/09/18 at 12:30pm and pt accepted.

## 2018-03-09 ENCOUNTER — Ambulatory Visit (INDEPENDENT_AMBULATORY_CARE_PROVIDER_SITE_OTHER): Payer: 59 | Admitting: Psychology

## 2018-03-09 DIAGNOSIS — F330 Major depressive disorder, recurrent, mild: Secondary | ICD-10-CM

## 2018-03-09 NOTE — Progress Notes (Signed)
   THERAPIST PROGRESS NOTE  Session Time: 12.31pm-1.16pm  Participation Level: Active  Behavioral Response: Well GroomedAlertDepressed  Type of Therapy: Individual Therapy  Treatment Goals addressed: Diagnosis: MDD and goal 1.  Interventions: CBT and Supportive  Summary: Brittany [name] is a 52 y.o. female who presents with affect wnl.  Pt reported that she been feeling more down recently and is aware impacted by upcoming holidays and missing dad.  Pt reported that she has been using her coping skills- journaling, focusing on the positive, walking and engaging in things.  Pt reported no SI and not ruminating on things either.  Pt reported that she is aware that not going to mom's for Thanksgiving is impacting- but not an option this year w/ sister temporarily livign w mom and watching her dogs.  Pt reported that she is looking forward to going to her oldest daughter's for the holiday and being part of that.  Pt does still feel a sense of loss w/ her other daughter and continued lack of response for her.  pt aware that can't let self start the what if and ruminating on that situation.  Pt discussed dating new guy that feels good connection w/ and taking slowly.   Suicidal/Homicidal: Nowithout intent/plan  Therapist Response: Assessed pt current functioning per pt report. Processed w/pt increased depressed mood and validating loss feelings.  Discussed ways she is coping and expressing and utilizing skills and supports.  Discussed positive of connecting w/ daughter and engaging w/ activities.  Plan: Return again in 3 weeks.  Diagnosis: MDD   [name], [facility] 03/09/2018

## 2018-03-16 ENCOUNTER — Other Ambulatory Visit (HOSPITAL_COMMUNITY): Payer: Self-pay | Admitting: Psychiatry

## 2018-03-16 DIAGNOSIS — F410 Panic disorder [episodic paroxysmal anxiety] without agoraphobia: Secondary | ICD-10-CM

## 2018-03-23 ENCOUNTER — Other Ambulatory Visit (HOSPITAL_COMMUNITY): Payer: Self-pay | Admitting: Psychiatry

## 2018-03-23 DIAGNOSIS — F410 Panic disorder [episodic paroxysmal anxiety] without agoraphobia: Secondary | ICD-10-CM

## 2018-03-26 ENCOUNTER — Other Ambulatory Visit: Payer: Self-pay | Admitting: Nurse Practitioner

## 2018-04-12 ENCOUNTER — Encounter (HOSPITAL_COMMUNITY): Payer: Self-pay | Admitting: *Deleted

## 2018-04-12 ENCOUNTER — Emergency Department (HOSPITAL_COMMUNITY)
Admission: EM | Admit: 2018-04-12 | Discharge: 2018-04-12 | Disposition: A | Discharge status: Home / Self Care | Payer: 59 | Attending: Emergency Medicine | Admitting: Emergency Medicine

## 2018-04-12 ENCOUNTER — Emergency Department (HOSPITAL_COMMUNITY): Payer: 59

## 2018-04-12 ENCOUNTER — Emergency Department (HOSPITAL_BASED_OUTPATIENT_CLINIC_OR_DEPARTMENT_OTHER): Payer: 59

## 2018-04-12 DIAGNOSIS — M79604 Pain in right leg: Secondary | ICD-10-CM

## 2018-04-12 DIAGNOSIS — I10 Essential (primary) hypertension: Secondary | ICD-10-CM | POA: Diagnosis not present

## 2018-04-12 DIAGNOSIS — Z87891 Personal history of nicotine dependence: Secondary | ICD-10-CM | POA: Diagnosis not present

## 2018-04-12 DIAGNOSIS — M79651 Pain in right thigh: Secondary | ICD-10-CM | POA: Insufficient documentation

## 2018-04-12 DIAGNOSIS — Z79899 Other long term (current) drug therapy: Secondary | ICD-10-CM | POA: Diagnosis not present

## 2018-04-12 MED ORDER — OXYCODONE HCL 5 MG PO TABS
5.0000 mg | ORAL_TABLET | Freq: Once | ORAL | Status: AC
  Administered 2018-04-12: 5 mg via ORAL
  Filled 2018-04-12: qty 1

## 2018-04-12 MED ORDER — METHOCARBAMOL 500 MG PO TABS: 500.0000 mg | ORAL_TABLET | Freq: Three times a day (TID) | ORAL | 0 refills | Status: DC | PRN

## 2018-04-12 NOTE — ED Provider Notes (Signed)
Emergency Department Provider Note   I have reviewed the triage vital signs and the nursing notes.   HISTORY  Chief Complaint Leg Pain (right)   HPI Brittany [name] is a 52 y.o. female with medical problems documented below the presents the emergency department today secondary to right lateral thigh pain.  Patient states that she was doing yard work and fell on Saturday and initially did not have any pain continue doing yard work but then woke up feeling pain everywhere in her body but specifically in her right hip that was worse than ever else.  She was able to go to a ball game yesterday walk a bit on the bleachers and do all kinds of things without any difficulty however pain seemed progressively worsened throughout the day.  Is when she woke up and it was still severe worse with movement so she came here after taken Tylenol and it not helping.  She is nitrate in the office symptoms.  Does not really have severe pain anywhere else.  She states that she landed flat on her back but does not have any midline back pain. No other associated or modifying symptoms.    Past Medical History:  Diagnosis Date  . Allergy   . Anxiety   . Arthritis    hips, knees and hands   . Blood transfusion without reported diagnosis   . Depression   . GERD (gastroesophageal reflux disease)    hx of   . H/O hiatal hernia   . History of chicken pox   . Hypercholesterolemia   . Hypertension   . Migraine   . Migraines   . Morbid obesity ([place])   . Positive TB test   . PTSD (post-traumatic stress disorder)   . Sleep apnea    no CPAP machine   . Ulcer, stomach peptic     Patient Active Problem List   Diagnosis Date Noted  . Melena   . Acute gastrojejunal anastomotic ulcer   . Macrocytic anemia   . Acute GI bleeding 09/28/2017  . Epigastric abdominal pain 11/19/2016  . Incisional hernia with obstruction but no gangrene 07/21/2016  . Menopause 04/22/2016  . Routine general medical examination at a  health care facility 03/18/2016  . Obesity 03/18/2016  . Severe episode of recurrent major depressive disorder, without psychotic features ([place])   . Depression, major, severe recurrence ([place]) 09/11/2015  . Major depressive disorder, recurrent, severe without psychotic features ([place])   . MDD (major depressive disorder) 10/30/2014  . MDD (major depressive disorder), recurrent severe, without psychosis ([place])   . Suicidal ideation 10/05/2014  . Depression 06/12/2014  . Rash 01/04/2014  . Normocytic anemia 01/04/2014  . DJD (degenerative joint disease) 01/04/2014  . Hypertension 01/04/2014  . Dyslipidemia 01/04/2014  . GERD (gastroesophageal reflux disease) 01/04/2014  . Obstructive sleep apnea 01/04/2014  . Status post small bowel resection 01/04/2014  . Abdominal abscess 01/01/2014  . Wound infection after surgery 01/01/2014  . PTSD (post-traumatic stress disorder) 11/23/2013  . Severe recurrent major depression without psychotic features ([place]) 11/22/2013  . 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 Morbid obesity, Weight - 312, BMI - 45.2 04/21/2012    Past Surgical History:  Procedure Laterality Date  . ABDOMINAL HYSTERECTOMY  1997 & 2006  . BREATH TEK H PYLORI  05/03/2012   Procedure: BREATH TEK H PYLORI;  Surgeon: [name], MD;  Location: [name];  Service: General;  Laterality: N/A;  . COLONOSCOPY  hx of benign polyps   . ESOPHAGOGASTRODUODENOSCOPY N/A 09/29/2017   Procedure: ESOPHAGOGASTRODUODENOSCOPY (EGD);  Surgeon: Ladene Artist, MD;  Location: [name];  Service: Endoscopy;  Laterality: N/A;  . FOOT SURGERY    . GASTRIC BYPASS    . GASTRIC ROUX-EN-Y N/A 07/05/2012   Procedure: LAPAROSCOPIC ROUX-EN-Y GASTRIC;  Surgeon: [name], MD;  Location: [facility];  Service: General;  Laterality: N/A;  . IRRIGATION AND DEBRIDEMENT ABSCESS N/A 01/01/2014   Procedure: IRRIGATION AND DEBRIDEMENT ABSCESS;  Surgeon: [name], MD;   Location: [facility];  Service: General;  Laterality: N/A;  . LAPAROSCOPIC LYSIS OF ADHESIONS N/A 07/05/2012   Procedure: LAPAROSCOPIC LYSIS OF ADHESIONS;  Surgeon: [name], MD;  Location: [facility];  Service: General;  Laterality: N/A;  repair of abdominal wall hernia  . right tube and ovary removed     . UPPER GI ENDOSCOPY N/A 07/05/2012   Procedure: UPPER GI ENDOSCOPY;  Surgeon: [name], MD;  Location: [facility];  Service: General;  Laterality: N/A;    Current Outpatient Rx  . Order #: [id number] Class: Historical Med  . Order #: [id number] Class: No Print  . Order #: [id number] Class: No Print  . Order #: [id number] Class: Normal  . Order #: [id number] Class: Historical Med  . Order #: [id number] Class: Print  . Order #: [id number] Class: No Print  . Order #: [id number] Class: Normal  . Order #: [id number] Class: Normal  . Order #: [id number] Class: Normal  . Order #: [id number] Class: No Print  . Order #: [id number] Class: Historical Med  . Order #: [id number] Class: Normal    Allergies Contrast media [iodinated diagnostic agents]; Amoxicillin; Bactrim [sulfamethoxazole-trimethoprim]; and Lamictal [lamotrigine]  Family History  Problem Relation Age of Onset  . Colon cancer Mother 83  . Depression Father   . Diabetes Father   . Heart disease Father   . Bladder Cancer Father   . Depression Sister   . Post-traumatic stress disorder Sister   . Alcohol abuse Paternal Uncle   . Bipolar disorder Cousin   . Anxiety disorder Sister   . Cancer Maternal Aunt        breast  . Breast cancer Maternal Aunt   . Melanoma Maternal Grandmother   . Colon cancer Maternal Grandmother        does not know age of onset  . Healthy Maternal Grandfather   . Healthy Paternal Grandmother   . Heart disease Paternal Grandfather   . Stroke Paternal Grandfather   . Diabetes Paternal Grandfather     Social History Social History   Tobacco Use  . Smoking status: Former Smoker    Packs/day: 0.10    Years: 1.00     Pack years: 0.10    Types: Cigarettes    Last attempt to quit: 04/27/2004    Years since quitting: 13.9  . Smokeless tobacco: Never Used  Substance Use Topics  . Alcohol use: No    Alcohol/week: 0.0 standard drinks    Comment: Occasional use/ 2 drinks a month  . Drug use: No    Review of Systems  All other systems negative except as documented in the HPI. All pertinent positives and negatives as reviewed in the HPI. ____________________________________________   PHYSICAL EXAM:  VITAL SIGNS: ED Triage Vitals  Enc Vitals Group     BP 04/12/18 0722 125/86     Pulse Rate 04/12/18 0722 82     Resp 04/12/18 0722 18     Temp 04/12/18 0722 98.1 F (36.7 C)  Temp Source 04/12/18 0722 Oral     SpO2 04/12/18 0722 93 %    Constitutional: Alert and oriented. Well appearing and in no acute distress. Eyes: Conjunctivae are normal. PERRL. EOMI. Head: Atraumatic. Nose: No congestion/rhinnorhea. Mouth/Throat: Mucous membranes are moist.  Oropharynx non-erythematous. Neck: No stridor.  No meningeal signs.   Cardiovascular: Normal rate, regular rhythm. Good peripheral circulation. Grossly normal heart sounds.   Respiratory: Normal respiratory effort.  No retractions. Lungs CTAB. Gastrointestinal: Soft and nontender. No distention.  Musculoskeletal: No lower extremity tenderness nor edema. No gross deformities of extremities. Pain with lifting right leg off of bed (in her right abductor muscle area). No midline back ttp, stepoffs or deformities.  Neurologic:  Normal speech and language. No gross focal neurologic deficits are appreciated.  Skin:  Skin is warm, dry and intact. No rash noted. Varicosities in BLE.  ____________________________________________   RADIOLOGY  Dg Femur Min 2 Views Right  Result Date: 04/12/2018 CLINICAL DATA:  Evaluate for fracture EXAM: RIGHT FEMUR 2 VIEWS COMPARISON:  None. FINDINGS: There is no evidence of fracture or other focal bone lesions. Minor  marginal spurring at the knee and hip. Negative soft tissues IMPRESSION: No acute or focal finding. Electronically Signed   By: Monte Fantasia M.D.   On: 04/12/2018 08:41    ____________________________________________   INITIAL IMPRESSION / ASSESSMENT AND PLAN / ED COURSE  No fracture or DVT on evaluation here.  Suspect likely muscular versus ligamentous cause.  Will give crutches for symptomatic treatment and muscle relaxers.  Will continue taking Tylenol but at lower dose than when she took.  Follow-up with PCP in 5 to 7 days if not improving for missed fracture or other musculoskeletal causes.  Pertinent labs & imaging results that were available during my care of the patient were reviewed by me and considered in my medical decision making (see chart for details).  ____________________________________________  FINAL CLINICAL IMPRESSION(S) / ED DIAGNOSES  Final diagnoses:  Right leg pain     MEDICATIONS GIVEN DURING THIS VISIT:  Medications  oxyCODONE (Oxy IR/ROXICODONE) immediate release tablet 5 mg (5 mg Oral Given 04/12/18 0919)     NEW OUTPATIENT MEDICATIONS STARTED DURING THIS VISIT:  New Prescriptions   METHOCARBAMOL (ROBAXIN) 500 MG TABLET    Take 1 tablet (500 mg total) by mouth every 8 (eight) hours as needed for muscle spasms.    Note:  This note was prepared with assistance of Dragon voice recognition software. Occasional wrong-word or sound-a-like substitutions may have occurred due to the inherent limitations of voice recognition software.   Merrily Pew, MD 04/12/18 1106

## 2018-04-12 NOTE — Progress Notes (Signed)
Right lower extremity venous duplex completed. Preliminary results - Thee is no evidence of a DVT or [place]'s cyst. [place] Jonny Longino,RVS  04/12/2018, 10:49 AM

## 2018-04-12 NOTE — ED Triage Notes (Signed)
Pt complains of right upper leg pain since falling 2 days ago. Pt fell while doing yard work. Pt has been taking tylenol, which has provided some relief. Pt denies swelling, bruising or deformity.

## 2018-04-13 ENCOUNTER — Ambulatory Visit (HOSPITAL_COMMUNITY): Payer: 59 | Admitting: Psychology

## 2018-04-25 ENCOUNTER — Other Ambulatory Visit: Payer: Self-pay | Admitting: Nurse Practitioner

## 2018-04-27 ENCOUNTER — Ambulatory Visit (INDEPENDENT_AMBULATORY_CARE_PROVIDER_SITE_OTHER): Payer: 59 | Admitting: Psychology

## 2018-04-27 DIAGNOSIS — F330 Major depressive disorder, recurrent, mild: Secondary | ICD-10-CM

## 2018-04-27 NOTE — Progress Notes (Signed)
   THERAPIST PROGRESS NOTE  Session Time: 12.30pm-1.25pm  Participation Level: Active  Behavioral Response: Well GroomedAlertaffect wnl  Type of Therapy: Individual Therapy  Treatment Goals addressed: Diagnosis: MDD and goal 1.  Interventions: CBT and Supportive  Summary: Brittany [name] is a 53 y.o. female who presents with affect wnl.  Pt reported that she has been dealing w/ some stressors.  Pt had visited mom for Christmas- her oldest sister has been distancing herself from family for years and over the holiday had an escalating crisis where her son's sought help from her as concerned for her mental health.  Pt good insight that sister struggling w/ past trauma but never sought cousneing and recent stressors of son in rehab and his medical issues- but doesn't give right to be verbally attacking mom and her.  Pt wanted to talk to her in person but family was concerned things would escalate and family wanted her to have sherrif do a well check. Pt aware she became the target as the doer and family spokesperson.  Pt discussed that she just try to keep calm, lovingly encouraged her to seek help.  Pt discussed that don't know what else to do and currently sister isn't talking to anyone.  Pt was able to acknowledge did all she could and not her responsibility.  Pt also injured her leg over break w/a fall and was on crutches for couple weeks.  Pt reported positive interactions w/ boyfriend and oldest daughter.  Pt discussed how youngest daughter responded to her text and invited to granddaughter birthday.  Pt discussed mixed emotions of wanting to be there- but also anger towards her daughter for shutting out for 2 years.  Pt didn't go to party also aware and has been fighting negative thoughts beating self up for decision.  Pt was able to identify that was self care- not reflective of being good or bad mom and focus on movement in relationship.     Suicidal/Homicidal: Nowithout intent/plan  Therapist  Response: Assessed pt current functioning per pt report.  Processed w/pt recent stressors and how she has coped through.  Reflected to pt good coping skills and strengths.  Discussed pt self care and awareness of negative self talk- how to challenge.    Plan: Return again in 2 weeks.  Diagnosis: MDD   [name], [facility] 04/27/2018

## 2018-05-14 ENCOUNTER — Ambulatory Visit (INDEPENDENT_AMBULATORY_CARE_PROVIDER_SITE_OTHER): Payer: 59 | Admitting: Family

## 2018-05-14 ENCOUNTER — Ambulatory Visit (INDEPENDENT_AMBULATORY_CARE_PROVIDER_SITE_OTHER)
Admission: RE | Admit: 2018-05-14 | Discharge: 2018-05-14 | Disposition: A | Discharge status: Home / Self Care | Payer: 59 | Source: Ambulatory Visit | Attending: Family | Admitting: Family

## 2018-05-14 ENCOUNTER — Encounter: Payer: Self-pay | Admitting: Family

## 2018-05-14 VITALS — BP 118/72 | HR 87 | Temp 98.1°F | Ht 70.0 in | Wt 274.0 lb

## 2018-05-14 DIAGNOSIS — M545 Low back pain, unspecified: Secondary | ICD-10-CM

## 2018-05-14 NOTE — Progress Notes (Signed)
Brittany [name] is a 53 y.o. female with the following history as recorded in [employer]:  Patient Active Problem List   Diagnosis Date Noted  . Melena   . Acute gastrojejunal anastomotic ulcer   . Macrocytic anemia   . Acute GI bleeding 09/28/2017  . Epigastric abdominal pain 11/19/2016  . Incisional hernia with obstruction but no gangrene 07/21/2016  . Menopause 04/22/2016  . Routine general medical examination at a health care facility 03/18/2016  . Obesity 03/18/2016  . Severe episode of recurrent major depressive disorder, without psychotic features ([place])   . Depression, major, severe recurrence ([place]) 09/11/2015  . Major depressive disorder, recurrent, severe without psychotic features ([place])   . MDD (major depressive disorder) 10/30/2014  . MDD (major depressive disorder), recurrent severe, without psychosis ([place])   . Suicidal ideation 10/05/2014  . Depression 06/12/2014  . Rash 01/04/2014  . Normocytic anemia 01/04/2014  . DJD (degenerative joint disease) 01/04/2014  . Hypertension 01/04/2014  . Dyslipidemia 01/04/2014  . GERD (gastroesophageal reflux disease) 01/04/2014  . Obstructive sleep apnea 01/04/2014  . Status post small bowel resection 01/04/2014  . Abdominal abscess 01/01/2014  . Wound infection after surgery 01/01/2014  . PTSD (post-traumatic stress disorder) 11/23/2013  . Severe recurrent major depression without psychotic features ([place]) 11/22/2013  . 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 Morbid obesity, Weight - 312, BMI - 45.2 04/21/2012    Current Outpatient Medications  Medication Sig Dispense Refill  . acetaminophen (TYLENOL 8 HOUR ARTHRITIS PAIN) 650 MG CR tablet Take 2,600 mg by mouth once.    . calcium-vitamin D (OSCAL WITH D) 500-200 MG-UNIT tablet Take 3 tablets by mouth every morning. For bone health (Patient taking differently: Take 3 tablets by mouth every evening. For bone health)    . cholecalciferol (VITAMIN D) 1000 units  tablet Take 1 tablet (1,000 Units total) by mouth daily. For bone health    . DULoxetine (CYMBALTA) 60 MG capsule Take 1 capsule (60 mg total) by mouth 2 (two) times daily. 180 capsule 0  . loratadine (CLARITIN) 10 MG tablet Take 10 mg by mouth daily.    Marland Kitchen LORazepam (ATIVAN) 0.5 MG tablet Take 1 tablet (0.5 mg total) by mouth daily as needed for anxiety. 20 tablet 0  . Multiple Vitamins-Minerals (MULTIVITAMIN WITH MINERALS) tablet Take 1 tablet by mouth daily. For low vitamin    . mupirocin ointment (BACTROBAN) 2 % APP EXT AA BID FOR 14 DAYS    . pantoprazole (PROTONIX) 40 MG tablet TAKE 1 TABLET BY MOUTH TWICE DAILY 60 tablet 0  . QUEtiapine (SEROQUEL) 50 MG tablet TAKE 3 TABLETS(150 MG) BY MOUTH AT BEDTIME 90 tablet 2  . vitamin B-12 (CYANOCOBALAMIN) 500 MCG tablet Take 1 tablet (500 mcg total) by mouth daily. For Vitamin B-12 replacement    . vitamin E 1000 UNIT capsule Take 1,000 Units by mouth daily.     No current facility-administered medications for this visit.     Allergies: Contrast media [iodinated diagnostic agents]; Amoxicillin; Bactrim [sulfamethoxazole-trimethoprim]; and Lamictal [lamotrigine]  Past Medical History:  Diagnosis Date  . Allergy   . Anxiety   . Arthritis    hips, knees and hands   . Blood transfusion without reported diagnosis   . Depression   . GERD (gastroesophageal reflux disease)    hx of   . H/O hiatal hernia   . History of chicken pox   . Hypercholesterolemia   . Hypertension   . Migraine   .  Migraines   . Morbid obesity ([place])   . Positive TB test   . PTSD (post-traumatic stress disorder)   . Sleep apnea    no CPAP machine   . Ulcer, stomach peptic     Past Surgical History:  Procedure Laterality Date  . ABDOMINAL HYSTERECTOMY  1997 & 2006  . BREATH TEK H PYLORI  05/03/2012   Procedure: BREATH TEK H PYLORI;  Surgeon: [name], MD;  Location: [name];  Service: General;  Laterality: N/A;  . COLONOSCOPY     hx of benign polyps   .  ESOPHAGOGASTRODUODENOSCOPY N/A 09/29/2017   Procedure: ESOPHAGOGASTRODUODENOSCOPY (EGD);  Surgeon: Ladene Artist, MD;  Location: [name];  Service: Endoscopy;  Laterality: N/A;  . FOOT SURGERY    . GASTRIC BYPASS    . GASTRIC ROUX-EN-Y N/A 07/05/2012   Procedure: LAPAROSCOPIC ROUX-EN-Y GASTRIC;  Surgeon: [name], MD;  Location: [facility];  Service: General;  Laterality: N/A;  . IRRIGATION AND DEBRIDEMENT ABSCESS N/A 01/01/2014   Procedure: IRRIGATION AND DEBRIDEMENT ABSCESS;  Surgeon: [name], MD;  Location: [facility];  Service: General;  Laterality: N/A;  . LAPAROSCOPIC LYSIS OF ADHESIONS N/A 07/05/2012   Procedure: LAPAROSCOPIC LYSIS OF ADHESIONS;  Surgeon: [name], MD;  Location: [facility];  Service: General;  Laterality: N/A;  repair of abdominal wall hernia  . right tube and ovary removed     . UPPER GI ENDOSCOPY N/A 07/05/2012   Procedure: UPPER GI ENDOSCOPY;  Surgeon: [name], MD;  Location: [facility];  Service: General;  Laterality: N/A;    Family History  Problem Relation Age of Onset  . Colon cancer Mother 70  . Depression Father   . Diabetes Father   . Heart disease Father   . Bladder Cancer Father   . Depression Sister   . Post-traumatic stress disorder Sister   . Alcohol abuse Paternal Uncle   . Bipolar disorder Cousin   . Anxiety disorder Sister   . Cancer Maternal Aunt        breast  . Breast cancer Maternal Aunt   . Melanoma Maternal Grandmother   . Colon cancer Maternal Grandmother        does not know age of onset  . Healthy Maternal Grandfather   . Healthy Paternal Grandmother   . Heart disease Paternal Grandfather   . Stroke Paternal Grandfather   . Diabetes Paternal Grandfather     Social History   Tobacco Use  . Smoking status: Former Smoker    Packs/day: 0.10    Years: 1.00    Pack years: 0.10    Types: Cigarettes    Last attempt to quit: 04/27/2004    Years since quitting: 14.0  . Smokeless tobacco: Never Used  Substance  Use Topics  . Alcohol use: No    Alcohol/week: 0.0 standard drinks    Comment: Occasional use/ 2 drinks a month    Subjective:  Patient injured her right thigh at the end of December; did go to ER- X-ray of right femur was unremarkable; notes is still having pain in the right thigh area- symptoms present with driving- changing from brake to accelerator; was put on crutches from ER- did feel like things were getting better but is concerned about how long symptoms are persisting;  Injured back while working outside- landed on her back while pulling on honeysuckle vines; notes that was able to keep working that day- the next day was when the pain was  most noticeable; feels like she is having to "pop Tylenol." Not able to take NSAIDs due to history of ulcer/ GI bleed;     Objective:  Vitals:   05/14/18 1321  BP: 118/72  Pulse: 87  Temp: 98.1 F (36.7 C)  TempSrc: Oral  SpO2: 96%  Weight: 274 lb (124.3 kg)  Height: 5\' 10"  (1.778 m)    General: Well developed, well nourished, in no acute distress  Skin : Warm and dry.  Head: Normocephalic and atraumatic  Lungs: Respirations unlabored;  Musculoskeletal: No deformities; no active joint inflammation  Extremities: No edema, cyanosis, clubbing  Vessels: Symmetric bilaterally  Neurologic: Alert and oriented; speech intact; face symmetrical; moves all extremities well; CNII-XII intact without focal deficit   Assessment:  1. Acute right-sided low back pain without sciatica     Plan:  Due to persisting pain, feel that need to update X-ray of lumbar and right hip- ? Fracture; also discussed that she might have torn a ligament which could take longer to heal; apply ice to area instead of heat especially since she can't take NSAIDs; follow-up to be determined- may need to see sports medicine for ultrasound.   No follow-ups on file.  Orders Placed This Encounter  Procedures  . DG Lumbar Spine 2-3 Views    Standing Status:   Future    Standing  Expiration Date:   07/13/2019    Order Specific Question:   Reason for Exam (SYMPTOM  OR DIAGNOSIS REQUIRED)    Answer:   right hip pain    Order Specific Question:   Is patient pregnant?    Answer:   No    Order Specific Question:   Preferred imaging location?    Answer:   [facility]    Order Specific Question:   Radiology Contrast Protocol - do NOT remove file path    Answer:   [email or web address]  . DG HIP UNILAT WITH PELVIS 2-3 VIEWS RIGHT    Standing Status:   Future    Standing Expiration Date:   07/13/2019    Order Specific Question:   Reason for Exam (SYMPTOM  OR DIAGNOSIS REQUIRED)    Answer:   right hip pain    Order Specific Question:   Is patient pregnant?    Answer:   No    Order Specific Question:   Preferred imaging location?    Answer:   [facility]    Order Specific Question:   Radiology Contrast Protocol - do NOT remove file path    Answer:   [email or web address]    Requested Prescriptions    No prescriptions requested or ordered in this encounter

## 2018-05-18 ENCOUNTER — Ambulatory Visit (INDEPENDENT_AMBULATORY_CARE_PROVIDER_SITE_OTHER): Payer: 59 | Admitting: Psychology

## 2018-05-18 DIAGNOSIS — F330 Major depressive disorder, recurrent, mild: Secondary | ICD-10-CM | POA: Diagnosis not present

## 2018-05-18 NOTE — Progress Notes (Signed)
   THERAPIST PROGRESS NOTE  Session Time: 12.35pm-1.24pm  Participation Level: Active  Behavioral Response: Well GroomedAlertaffect wnl  Type of Therapy: Individual Therapy  Treatment Goals addressed: Diagnosis: MDD and goal 1.  Interventions: CBT and Supportive  Summary: Esperansa LILITH SOLANA is a 53 y.o. female who presents with affect wnl.  Pt reported that she is still dealing w/ pain in her thigh since the fall- went to have xray this week not broken- but referred for ultrasound to dx any soft tissue damage.  Pt reported that she continues w/ relationship w/ boyfriend and has been positive- recently met his youngest son.  Pt discussed how now being introduced to his inner circle and that w/ her inner circle not being in immediate range- hasn't met hers but wanting to begin this.  Pt discussed hurt from youngest daughter's lack of further communication and discussed no progress w/ her oldest sister. Pt was able to externalize and acknowledge that she has done what she can.  Pt did share concern for aging friend who is seeming more confused and forgetting things- as well as recently totally her car.  Pt aware that can only help to certain extent and feels good about researching resources for her.    Suicidal/Homicidal: Nowithout intent/plan  Therapist Response: Assessed pt current functioning per pt report. Processed w/pt w/ coping w/ some stressors.  Reflecting her positives and efforts she puts in and awareness of can't force other person.  Reflected her supports and progress pt continues.   Plan: Return again in 2 weeks.  Diagnosis: MDD  [name], [facility] 05/18/2018

## 2018-05-19 ENCOUNTER — Encounter: Payer: Self-pay | Admitting: Family Medicine

## 2018-05-19 ENCOUNTER — Ambulatory Visit (INDEPENDENT_AMBULATORY_CARE_PROVIDER_SITE_OTHER): Payer: 59

## 2018-05-19 ENCOUNTER — Ambulatory Visit (INDEPENDENT_AMBULATORY_CARE_PROVIDER_SITE_OTHER): Payer: 59 | Admitting: Family Medicine

## 2018-05-19 VITALS — BP 108/80 | Ht 70.0 in | Wt 272.0 lb

## 2018-05-19 DIAGNOSIS — M79651 Pain in right thigh: Secondary | ICD-10-CM

## 2018-05-19 NOTE — Patient Instructions (Signed)
Nice to meet you  Please let me know if your pain improves  You will get a call about physical therapy  Please see me back in 4 weeks.

## 2018-05-19 NOTE — Progress Notes (Signed)
Brittany [name] - 53 y.o. female MRN [id number]  Date of birth: [birth year]  SUBJECTIVE:  Including CC & ROS.  Chief Complaint  Patient presents with  . Leg Pain    right, pt fell 04/14/18    Devery NEREYDA BOWLER is a 53 y.o. female that is presenting with acute right thigh pain.  The pain is located on anterior thigh and she feels some in the groin.  This all started after she fell while she was working on the yard.  Has been occurring for the past 3 weeks.  Has been evaluated the emergency department.  Imaging up to this point has been negative.  She cannot take anti-inflammatories due to a history of ulcer.  Has been taken Tylenol.  Pain is sharp and stabbing in nature.  Pain is worse with certain movements and walking.  She usually walks 1 to 2 miles while at work but is unable to do this since the pain started.  No prior history of surgery.  Independent review of the right hip x-ray from 1/31 shows mild degenerative changes of the hip joint.  Independent review of the lumbar spine x-ray from 1/31 shows kissing osteophytes at L1-2  Review of Systems  Constitutional: Negative for fever.  HENT: Negative for congestion.   Respiratory: Negative for cough.   Cardiovascular: Negative for chest pain.  Gastrointestinal: Negative for abdominal pain.  Musculoskeletal: Positive for gait problem.  Skin: Negative for color change.  Neurological: Negative for weakness.  Hematological: Negative for adenopathy.  Psychiatric/Behavioral: Negative for agitation.    HISTORY: Past Medical, Surgical, Social, and Family History Reviewed & Updated per EMR.   Pertinent Historical Findings include:  Past Medical History:  Diagnosis Date  . Allergy   . Anxiety   . Arthritis    hips, knees and hands   . Blood transfusion without reported diagnosis   . Depression   . GERD (gastroesophageal reflux disease)    hx of   . H/O hiatal hernia   . History of chicken pox   . Hypercholesterolemia   . Hypertension   .  Migraine   . Migraines   . Morbid obesity ([place])   . Positive TB test   . PTSD (post-traumatic stress disorder)   . Sleep apnea    no CPAP machine   . Ulcer, stomach peptic     Past Surgical History:  Procedure Laterality Date  . ABDOMINAL HYSTERECTOMY  1997 & 2006  . BREATH TEK H PYLORI  05/03/2012   Procedure: BREATH TEK H PYLORI;  Surgeon: [name], MD;  Location: [name];  Service: General;  Laterality: N/A;  . COLONOSCOPY     hx of benign polyps   . ESOPHAGOGASTRODUODENOSCOPY N/A 09/29/2017   Procedure: ESOPHAGOGASTRODUODENOSCOPY (EGD);  Surgeon: Ladene Artist, MD;  Location: [name];  Service: Endoscopy;  Laterality: N/A;  . FOOT SURGERY    . GASTRIC BYPASS    . GASTRIC ROUX-EN-Y N/A 07/05/2012   Procedure: LAPAROSCOPIC ROUX-EN-Y GASTRIC;  Surgeon: [name], MD;  Location: [facility];  Service: General;  Laterality: N/A;  . IRRIGATION AND DEBRIDEMENT ABSCESS N/A 01/01/2014   Procedure: IRRIGATION AND DEBRIDEMENT ABSCESS;  Surgeon: [name], MD;  Location: [facility];  Service: General;  Laterality: N/A;  . LAPAROSCOPIC LYSIS OF ADHESIONS N/A 07/05/2012   Procedure: LAPAROSCOPIC LYSIS OF ADHESIONS;  Surgeon: [name], MD;  Location: [facility];  Service: General;  Laterality: N/A;  repair of abdominal wall hernia  . right  tube and ovary removed     . UPPER GI ENDOSCOPY N/A 07/05/2012   Procedure: UPPER GI ENDOSCOPY;  Surgeon: [name], MD;  Location: [facility];  Service: General;  Laterality: N/A;    Allergies  Allergen Reactions  . Contrast Media [Iodinated Diagnostic Agents] Hives  . Amoxicillin Hives    .Marland KitchenHas patient had a PCN reaction causing immediate rash, facial/tongue/throat swelling, SOB or lightheadedness with hypotension: Yes Has patient had a PCN reaction causing severe rash involving mucus membranes or skin necrosis: No Has patient had a PCN reaction that required hospitalization No Has patient had a PCN reaction occurring within the  last 10 years: No If all of the above answers are "NO", then may proceed with Cephalosporin use.   . Bactrim [Sulfamethoxazole-Trimethoprim] Hives and Rash  . Lamictal [Lamotrigine] Hives and Rash    Family History  Problem Relation Age of Onset  . Colon cancer Mother 44  . Depression Father   . Diabetes Father   . Heart disease Father   . Bladder Cancer Father   . Depression Sister   . Post-traumatic stress disorder Sister   . Alcohol abuse Paternal Uncle   . Bipolar disorder Cousin   . Anxiety disorder Sister   . Cancer Maternal Aunt        breast  . Breast cancer Maternal Aunt   . Melanoma Maternal Grandmother   . Colon cancer Maternal Grandmother        does not know age of onset  . Healthy Maternal Grandfather   . Healthy Paternal Grandmother   . Heart disease Paternal Grandfather   . Stroke Paternal Grandfather   . Diabetes Paternal Grandfather      Social History   Socioeconomic History  . Marital status: Divorced    Spouse name: Not on file  . Number of children: 0  . Years of education: 44  . Highest education level: Not on file  Occupational History  . Occupation: Software engineer   Social Needs  . Financial resource strain: Not on file  . Food insecurity:    Worry: Not on file    Inability: Not on file  . Transportation needs:    Medical: Not on file    Non-medical: Not on file  Tobacco Use  . Smoking status: Former Smoker    Packs/day: 0.10    Years: 1.00    Pack years: 0.10    Types: Cigarettes    Last attempt to quit: 04/27/2004    Years since quitting: 14.0  . Smokeless tobacco: Never Used  Substance and Sexual Activity  . Alcohol use: No    Alcohol/week: 0.0 standard drinks    Comment: Occasional use/ 2 drinks a month  . Drug use: No  . Sexual activity: Yes    Birth control/protection: Surgical    Comment: 1st intercourse 53 yo-More than 5 partners  Lifestyle  . Physical activity:    Days per week: Not on file     Minutes per session: Not on file  . Stress: Not on file  Relationships  . Social connections:    Talks on phone: Not on file    Gets together: Not on file    Attends religious service: Not on file    Active member of club or organization: Not on file    Attends meetings of clubs or organizations: Not on file    Relationship status: Not on file  . Intimate partner violence:    Fear  of current or ex partner: Not on file    Emotionally abused: Not on file    Physically abused: Not on file    Forced sexual activity: Not on file  Other Topics Concern  . Not on file  Social History Narrative   Fun: Color and walk, house renovations   Denies abuse and feels safe at home.      PHYSICAL EXAM:  VS: BP 108/80   Ht 5\' 10"  (1.778 m)   Wt 272 lb (123.4 kg)   BMI 39.03 kg/m  Physical Exam Gen: NAD, alert, cooperative with exam, well-appearing ENT: normal lips, normal nasal mucosa,  Eye: normal EOM, normal conjunctiva and lids CV:  no edema, +2 pedal pulses   Resp: no accessory muscle use, non-labored,   Skin: no rashes, no areas of induration  Neuro: normal tone, normal sensation to touch Psych:  normal insight, alert and oriented MSK:  Right Hip:  No ecchymosis or swelling  Normal IR and ER  Pain with hip flexion and IR  Mild TTP of the AIIS  Normal knee flexion and extension strength to resistance.  Negative SLR  Mild TTP over the GT  Weakness with hip abduction.  Pain with FADIR and FABER.  Neurovascularly intact.    Aspiration/Injection Procedure Note Aayra ZARIA TAHA Sep 01, 1965  Procedure: Injection Indications: right hip pain   Procedure Details Consent: Risks of procedure as well as the alternatives and risks of each were explained to the (patient/caregiver).  Consent for procedure obtained. Time Out: Verified patient identification, verified procedure, site/side was marked, verified correct patient position, special equipment/implants available,  medications/allergies/relevent history reviewed, required imaging and test results available.  Performed.  The area was cleaned with iodine and alcohol swabs.    The right hip joint was injected using 1 cc's of 40 mg kenalog and 4 cc's of 0.25% bupivacaine with a 22 3 1/2" needle.  Ultrasound was used. Images were obtained in long views showing the injection.     A sterile dressing was applied.  Patient did tolerate procedure well.        ASSESSMENT & PLAN:   Right thigh pain Pain started after a fall onto her back. Imaging has been negative up to this point. Possible for labral pathology or exacerbation of underlying arthritis of right hip.  - hip injection  - referral to PT  - counseled on HEP  - if no improvement consider MRI to evaluate labrum.

## 2018-05-19 NOTE — Assessment & Plan Note (Signed)
Pain started after a fall onto her back. Imaging has been negative up to this point. Possible for labral pathology or exacerbation of underlying arthritis of right hip.  - hip injection  - referral to PT  - counseled on HEP  - if no improvement consider MRI to evaluate labrum.

## 2018-05-24 ENCOUNTER — Ambulatory Visit (INDEPENDENT_AMBULATORY_CARE_PROVIDER_SITE_OTHER): Payer: 59 | Admitting: Psychiatry

## 2018-05-24 ENCOUNTER — Encounter (HOSPITAL_COMMUNITY): Payer: Self-pay | Admitting: Psychiatry

## 2018-05-24 VITALS — BP 121/85 | HR 69 | Ht 70.0 in | Wt 272.4 lb

## 2018-05-24 DIAGNOSIS — F410 Panic disorder [episodic paroxysmal anxiety] without agoraphobia: Secondary | ICD-10-CM | POA: Diagnosis not present

## 2018-05-24 DIAGNOSIS — F4310 Post-traumatic stress disorder, unspecified: Secondary | ICD-10-CM

## 2018-05-24 DIAGNOSIS — F3341 Major depressive disorder, recurrent, in partial remission: Secondary | ICD-10-CM

## 2018-05-24 DIAGNOSIS — Z5181 Encounter for therapeutic drug level monitoring: Secondary | ICD-10-CM

## 2018-05-24 MED ORDER — QUETIAPINE FUMARATE 50 MG PO TABS: ORAL_TABLET | ORAL | 2 refills | Status: DC

## 2018-05-24 MED ORDER — DULOXETINE HCL 60 MG PO CPEP: 60.0000 mg | ORAL_CAPSULE | Freq: Two times a day (BID) | ORAL | 0 refills | Status: DC

## 2018-05-24 MED ORDER — LORAZEPAM 0.5 MG PO TABS: 0.5000 mg | ORAL_TABLET | Freq: Every day | ORAL | 0 refills | Status: DC | PRN

## 2018-05-24 NOTE — Addendum Note (Signed)
Addended by: Lethea Killings on: 05/24/2018 10:48 AM   Modules accepted: Orders
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
## 2018-05-25 ENCOUNTER — Other Ambulatory Visit: Payer: Self-pay | Admitting: Nurse Practitioner

## 2018-05-25 ENCOUNTER — Encounter: Payer: Self-pay | Admitting: Physical Therapy

## 2018-05-25 ENCOUNTER — Ambulatory Visit: Payer: 59 | Attending: Family Medicine | Admitting: Physical Therapy

## 2018-05-25 DIAGNOSIS — R262 Difficulty in walking, not elsewhere classified: Secondary | ICD-10-CM | POA: Insufficient documentation

## 2018-05-25 DIAGNOSIS — M79651 Pain in right thigh: Secondary | ICD-10-CM | POA: Diagnosis present

## 2018-05-25 DIAGNOSIS — M25551 Pain in right hip: Secondary | ICD-10-CM | POA: Diagnosis present

## 2018-05-25 LAB — COMPREHENSIVE METABOLIC PANEL
ALT: 22 IU/L (ref 0–32)
AST: 12 IU/L (ref 0–40)
Albumin/Globulin Ratio: 1.7 (ref 1.2–2.2)
Albumin: 4.3 g/dL (ref 3.8–4.9)
Alkaline Phosphatase: 83 IU/L (ref 39–117)
BUN/Creatinine Ratio: 25 — ABNORMAL HIGH (ref 9–23)
BUN: 17 mg/dL (ref 6–24)
Bilirubin Total: 0.4 mg/dL (ref 0.0–1.2)
CO2: 22 mmol/L (ref 20–29)
Calcium: 9.1 mg/dL (ref 8.7–10.2)
Chloride: 102 mmol/L (ref 96–106)
Creatinine, Ser: 0.69 mg/dL (ref 0.57–1.00)
GFR calc Af Amer: 115 mL/min/{1.73_m2} (ref >59)
GFR calc non Af Amer: 100 mL/min/{1.73_m2} (ref >59)
Globulin, Total: 2.6 g/dL (ref 1.5–4.5)
Glucose: 91 mg/dL (ref 65–99)
Potassium: 4.5 mmol/L (ref 3.5–5.2)
Sodium: 140 mmol/L (ref 134–144)
Total Protein: 6.9 g/dL (ref 6.0–8.5)

## 2018-05-25 LAB — CBC WITH DIFFERENTIAL/PLATELET
BASOS: 1 %
Basophils Absolute: 0.1 10*3/uL (ref 0.0–0.2)
EOS (ABSOLUTE): 0.1 10*3/uL (ref 0.0–0.4)
Eos: 2 %
Hematocrit: 39.5 % (ref 34.0–46.6)
Hemoglobin: 13.6 g/dL (ref 11.1–15.9)
Immature Grans (Abs): 0 10*3/uL (ref 0.0–0.1)
Immature Granulocytes: 1 %
Lymphocytes Absolute: 2.1 10*3/uL (ref 0.7–3.1)
Lymphs: 33 %
MCH: 34.3 pg — ABNORMAL HIGH (ref 26.6–33.0)
MCHC: 34.4 g/dL (ref 31.5–35.7)
MCV: 100 fL — ABNORMAL HIGH (ref 79–97)
Monocytes Absolute: 0.5 10*3/uL (ref 0.1–0.9)
Monocytes: 8 %
Neutrophils Absolute: 3.6 10*3/uL (ref 1.4–7.0)
Neutrophils: 55 %
Platelets: 348 10*3/uL (ref 150–450)
RBC: 3.97 x10E6/uL (ref 3.77–5.28)
RDW: 12 % (ref 11.7–15.4)
WBC: 6.4 10*3/uL (ref 3.4–10.8)

## 2018-05-25 LAB — TSH: TSH: 1.96 u[IU]/mL (ref 0.450–4.500)

## 2018-05-25 LAB — HEMOGLOBIN A1C
ESTIMATED AVERAGE GLUCOSE: 108 mg/dL
HEMOGLOBIN A1C: 5.4 % (ref 4.8–5.6)

## 2018-05-25 NOTE — Therapy (Signed)
[place], [place], [address] Phone: [phone]   Fax:  [phone]  Physical Therapy Evaluation  Patient Details  Name: Brittany [name] MRN: [id number] Date of Birth: May 02, 1965 Referring Provider (PT): Clearance Coots, MD   Encounter Date: 05/25/2018  PT End of Session - 05/25/18 2100    Visit Number  1    Number of Visits  12    Date for PT Re-Evaluation  07/06/18    Authorization Type  Aetna    PT Start Time  1630    PT Stop Time  1716    PT Time Calculation (min)  46 min    Activity Tolerance  Patient tolerated treatment well    Behavior During Therapy  [facility] for tasks assessed/performed       Past Medical History:  Diagnosis Date  . Allergy   . Anxiety   . Arthritis    hips, knees and hands   . Blood transfusion without reported diagnosis   . Depression   . GERD (gastroesophageal reflux disease)    hx of   . H/O hiatal hernia   . History of chicken pox   . Hypercholesterolemia   . Hypertension   . Migraine   . Migraines   . Morbid obesity ([place])   . Positive TB test   . PTSD (post-traumatic stress disorder)   . Sleep apnea    no CPAP machine   . Ulcer, stomach peptic     Past Surgical History:  Procedure Laterality Date  . ABDOMINAL HYSTERECTOMY  1997 & 2006  . BREATH TEK H PYLORI  05/03/2012   Procedure: BREATH TEK H PYLORI;  Surgeon: [name], MD;  Location: [name];  Service: General;  Laterality: N/A;  . COLONOSCOPY     hx of benign polyps   . ESOPHAGOGASTRODUODENOSCOPY N/A 09/29/2017   Procedure: ESOPHAGOGASTRODUODENOSCOPY (EGD);  Surgeon: Ladene Artist, MD;  Location: [name];  Service: Endoscopy;  Laterality: N/A;  . FOOT SURGERY    . GASTRIC BYPASS    . GASTRIC ROUX-EN-Y N/A 07/05/2012   Procedure: LAPAROSCOPIC ROUX-EN-Y GASTRIC;  Surgeon: [name], MD;  Location: [facility];  Service: General;  Laterality: N/A;  . IRRIGATION AND DEBRIDEMENT ABSCESS N/A  01/01/2014   Procedure: IRRIGATION AND DEBRIDEMENT ABSCESS;  Surgeon: [name], MD;  Location: [facility];  Service: General;  Laterality: N/A;  . LAPAROSCOPIC LYSIS OF ADHESIONS N/A 07/05/2012   Procedure: LAPAROSCOPIC LYSIS OF ADHESIONS;  Surgeon: [name], MD;  Location: [facility];  Service: General;  Laterality: N/A;  repair of abdominal wall hernia  . right tube and ovary removed     . UPPER GI ENDOSCOPY N/A 07/05/2012   Procedure: UPPER GI ENDOSCOPY;  Surgeon: [name], MD;  Location: [facility];  Service: General;  Laterality: N/A;    There were no vitals filed for this visit.   Subjective Assessment - 05/25/18 2037    Subjective  Pt. is a 53 y/o female referred to PT with c/o right thigh and hip pain with acute onset 04/14/18 in incident in which she fell backward while performing yardwork. Pt. went to the ED for evaluation a few days later and had X-rays to rule of fracture as well as doppler to rule out DVT which were both negative. Pain has persisted with symptoms including right anterior hip and groin pain as well as posterolateral hip pain. Pt. had US guided injection which she reports was very  helpful particularly for anterior hip symptoms. Primary pain noted today is posterolateral hip and thigh pain.    Pertinent History  depression, anxiety, obesity    Limitations  Standing;Walking;House hold activities    Diagnostic tests  X-rays, doppler US    Patient Stated Goals  Return to walking exercise (had been walking 1-2 miles daily prior to injury and is currently unable)    Currently in Pain?  Yes    Pain Score  2     Pain Location  Hip   right posterolateral hip and right thigh   Pain Orientation  Right    Pain Descriptors / Indicators  Aching    Pain Type  Acute pain    Pain Radiating Towards  posterolateral hip, groin and anterior thigh    Pain Onset  More than a month ago    Pain Frequency  Intermittent    Aggravating Factors   standing and walking    Pain Relieving  Factors  rest, injection, topical analgesic    Effect of Pain on Daily Activities  limits tolerance standing and ambulation for IADLs, community mobility and walking exercise         [facility] PT Assessment - 05/25/18 0001      Assessment   Medical Diagnosis  Right thigh pain    Referring Provider (PT)  Clearance Coots, MD    Onset Date/Surgical Date  04/10/18    Hand Dominance  Right    Prior Therapy  none      Precautions   Precautions  None      Restrictions   Weight Bearing Restrictions  No      Balance Screen   Has the patient fallen in the past 6 months  Yes   with current injury as noted in subjective   How many times?  [place] residence    Home Layout  --   2 story home, no steps to enter     Cognition   Overall Cognitive Status  Within Functional Limits for tasks assessed      Observation/Other Assessments   Focus on Therapeutic Outcomes (FOTO)   45% Limited      ROM / Strength   AROM / PROM / Strength  AROM;PROM;Strength      AROM   AROM Assessment Site  Hip;Knee    Right/Left Hip  Right;Left    Right Hip Flexion  100    Right Hip External Rotation   35    Right Hip Internal Rotation   20    Right Hip ABduction  30    Right Hip ADduction  --   WFL   Left Hip Flexion  100    Left Hip External Rotation   60    Left Hip Internal Rotation   20    Left Hip ABduction  45    Left Hip ADduction  --   WFL     PROM   Overall PROM Comments  Passive ROM limited grossly consistent with AROM due to pain, "empty" endfeels/not tested to true end-range due to pain      Strength   Overall Strength Comments  R hip strength grossly 4+/5, left hip 5/5, bilat. knees 5/5      Flexibility   Soft Tissue Assessment /Muscle Length  --   SLR 90 deg     Palpation   Palpation comment  Tender to palpation with muscle tightness right  gluteus medius and piriformis region, no sigificant tenderness in thigh or at greater trochanter       Special Tests    Special Tests  --   FABER, FADIR, Scour all (+) for pain on right               Objective measurements completed on examination: See above findings.        Trigger Point Dry Needling - 05/25/18 2056    Consent Given?  Yes    Education Handout Provided  Yes    Muscles Treated Lower Body  Gluteus minimus;Piriformis    Gluteus Minimus Response  Palpable increased muscle length   incl. gluteus medius   Piriformis Response  Palpable increased muscle length           PT Education - 05/25/18 2059    Education Details  potential symptom etiology, POC, dry needling    Person(s) Educated  Patient    Methods  Explanation    Comprehension  Verbalized understanding          PT Long Term Goals - 05/25/18 2108      PT LONG TERM GOAL #1   Title  Independent with HEP    Baseline  no HEP    Time  6    Period  Weeks    Status  New    Target Date  07/06/18      PT LONG TERM GOAL #2   Title  Improve FOTO score to 34% or less impairment    Baseline  45% limited    Time  6    Period  Weeks    Status  New    Target Date  07/06/18      PT LONG TERM GOAL #3   Title  Right hip strength grossly 5/5 to improve ability for stair navigation at home and sit>stand from low chairs    Baseline  grossly 4+/5    Time  6    Period  Weeks    Status  New    Target Date  07/06/18      PT LONG TERM GOAL #4   Title  Tolerate standing, ambulation for activities such as grocery shopping and IADLs periods at least 30-40 min with hip pain 2/10 or less    Baseline  difficulty tolerating due to hip/thigh pain    Time  6    Period  Weeks    Status  New    Target Date  07/06/18      PT LONG TERM GOAL #5   Title  Resume walking exercise program    Baseline  unable due to current pain issues    Time  6    Period  Weeks    Status  New    Target Date  07/06/18             Plan - 05/25/18 2100    Clinical Impression Statement  Pt. presents with acute  onset right thigh and hip pain s/p fall. Current symptoms and eval findings appear multi-factorial with anterior hip/groin pain symptoms concerning for OA vs. labral pathology, suspect muscular etiology to posterolateral hip pain potentially exacerbated with altered gait mechanics and antalgia s/p fall. Pt. would benefit from PT to help relieve pain and address current associated functional limitations for mobility.    History and Personal Factors relevant to plan of care:  falls, anxiety, depression, obesity    Clinical Presentation  Evolving    Clinical Presentation due  to:  unclear etiology with potential OA vs. labral pathology along with potential muscular contribution symptoms    Clinical Decision Making  Moderate    Rehab Potential  Fair    Clinical Impairments Affecting Rehab Potential  Time since onset, potential for underlying intrinsic hip pathology    PT Frequency  2x / week    PT Duration  6 weeks    PT Treatment/Interventions  ADLs/Self Care Home Management;Cryotherapy;Moist Heat;Ultrasound;Electrical Stimulation;Functional mobility training;Neuromuscular re-education;Therapeutic exercise;Therapeutic activities;Patient/family education;Manual techniques;Dry needling;Taping    PT Next Visit Plan  NUSTEP warm up, hip ROM, gentle stretches and strengthening as tolerated, pending exercise tolerance instruct in HEP for hip ROM, strengthening, check response dry needling and further include as found beneficial    PT Home Exercise Plan  will instruct as appropriate pending exercise tolerance at next follow up session    Consulted and Agree with Plan of Care  Patient       Patient will benefit from skilled therapeutic intervention in order to improve the following deficits and impairments:  Pain, Obesity, Difficulty walking, Decreased activity tolerance, Decreased endurance, Decreased range of motion, Decreased strength  Visit Diagnosis: Pain in right thigh  Pain in right hip  Difficulty  in walking, not elsewhere classified     Problem List Patient Active Problem List   Diagnosis Date Noted  . Right thigh pain 05/19/2018  . Melena   . Acute gastrojejunal anastomotic ulcer   . Macrocytic anemia   . Acute GI bleeding 09/28/2017  . Epigastric abdominal pain 11/19/2016  . Incisional hernia with obstruction but no gangrene 07/21/2016  . Menopause 04/22/2016  . Routine general medical examination at a health care facility 03/18/2016  . Obesity 03/18/2016  . Severe episode of recurrent major depressive disorder, without psychotic features ([place])   . Depression, major, severe recurrence ([place]) 09/11/2015  . Major depressive disorder, recurrent, severe without psychotic features ([place])   . MDD (major depressive disorder) 10/30/2014  . MDD (major depressive disorder), recurrent severe, without psychosis ([place])   . Suicidal ideation 10/05/2014  . Depression 06/12/2014  . Rash 01/04/2014  . Normocytic anemia 01/04/2014  . DJD (degenerative joint disease) 01/04/2014  . Hypertension 01/04/2014  . Dyslipidemia 01/04/2014  . GERD (gastroesophageal reflux disease) 01/04/2014  . Obstructive sleep apnea 01/04/2014  . Status post small bowel resection 01/04/2014  . Abdominal abscess 01/01/2014  . Wound infection after surgery 01/01/2014  . PTSD (post-traumatic stress disorder) 11/23/2013  . Severe recurrent major depression without psychotic features ([place]) 11/22/2013  . 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 Morbid obesity, Weight - 312, BMI - 45.2 04/21/2012   Beaulah Dinning, PT, DPT 05/25/18 9:13 PM  [facility] [address] [place], [place], [address] Phone: [phone]   Fax:  [phone]  Name: Brittany [name] MRN: [id number] Date of Birth: 1965-11-11

## 2018-06-01 ENCOUNTER — Ambulatory Visit (INDEPENDENT_AMBULATORY_CARE_PROVIDER_SITE_OTHER): Payer: 59 | Admitting: Psychology

## 2018-06-01 DIAGNOSIS — F3341 Major depressive disorder, recurrent, in partial remission: Secondary | ICD-10-CM

## 2018-06-01 NOTE — Progress Notes (Signed)
   THERAPIST PROGRESS NOTE  Session Time: 12.31pm-1.13pm  Participation Level: Active  Behavioral Response: Well GroomedAlertaffect wnl  Type of Therapy: Individual Therapy  Treatment Goals addressed: Diagnosis: MDD and goal 1.  Interventions: CBT and Supportive  Summary: Brittany [name] is a 53 y.o. female who presents with affect wnl.  Pt reported that she is getting over a cold and has been little dragging.  Pt reported that mood has continued to be improved.  Pt did report that work has been stressor and at time last week feeling a little overwhelmed and had thought about taking PRN meds but used her coping skills first and reframing.  Pt reported felt good to be able to handle.  Pt reported that she has had a couple incidents of standing up for self- asserting- saying no.  Pt reported felt good to do and she really in embracing- was freeing to do.  Pt reports that she and boyfriend are going to meet her family where she grew up- nervous but ready. Pt feeling good about this step and that can be self and taking things slowly in relationship. .   Suicidal/Homicidal: Nowithout intent/plan  Therapist Response: Assessed pt current functioning per pt report. Processed w/pt coping w/ recent stressors and reflected effective use of coping skills.  Explored w/pt use of assertive skills and positive outcomes.  Plan: Return again in 2-3 weeks.  Diagnosis: MDD in partial remission   Wyatt Galvan, LPC 06/01/2018

## 2018-06-04 ENCOUNTER — Ambulatory Visit: Payer: 59 | Admitting: Physical Therapy

## 2018-06-04 ENCOUNTER — Encounter: Payer: Self-pay | Admitting: Physical Therapy

## 2018-06-04 DIAGNOSIS — M79651 Pain in right thigh: Secondary | ICD-10-CM

## 2018-06-04 DIAGNOSIS — R262 Difficulty in walking, not elsewhere classified: Secondary | ICD-10-CM

## 2018-06-04 DIAGNOSIS — M25551 Pain in right hip: Secondary | ICD-10-CM

## 2018-06-04 NOTE — Therapy (Signed)
[place], [place], [address] Phone: [phone]   Fax:  [phone]  Physical Therapy Treatment  Patient Details  Name: Brittany [name] MRN: [id number] Date of Birth: [birth year] Referring Provider (PT): Clearance Coots, MD   Encounter Date: 06/04/2018  PT End of Session - 06/04/18 0847    Visit Number  2    Number of Visits  12    Date for PT Re-Evaluation  07/06/18    Authorization Type  Aetna    PT Start Time  [phone]    PT Stop Time  0844    PT Time Calculation (min)  50 min    Activity Tolerance  Patient tolerated treatment well;Patient limited by pain   SLRs limited by pain but otherwise able to tolerate   Behavior During Therapy  [facility] for tasks assessed/performed       Past Medical History:  Diagnosis Date  . Allergy   . Anxiety   . Arthritis    hips, knees and hands   . Blood transfusion without reported diagnosis   . Depression   . GERD (gastroesophageal reflux disease)    hx of   . H/O hiatal hernia   . History of chicken pox   . Hypercholesterolemia   . Hypertension   . Migraine   . Migraines   . Morbid obesity ([place])   . Positive TB test   . PTSD (post-traumatic stress disorder)   . Sleep apnea    no CPAP machine   . Ulcer, stomach peptic     Past Surgical History:  Procedure Laterality Date  . ABDOMINAL HYSTERECTOMY  1997 & 2006  . BREATH TEK H PYLORI  05/03/2012   Procedure: BREATH TEK H PYLORI;  Surgeon: [name], MD;  Location: [name];  Service: General;  Laterality: N/A;  . COLONOSCOPY     hx of benign polyps   . ESOPHAGOGASTRODUODENOSCOPY N/A 09/29/2017   Procedure: ESOPHAGOGASTRODUODENOSCOPY (EGD);  Surgeon: Ladene Artist, MD;  Location: [name];  Service: Endoscopy;  Laterality: N/A;  . FOOT SURGERY    . GASTRIC BYPASS    . GASTRIC ROUX-EN-Y N/A 07/05/2012   Procedure: LAPAROSCOPIC ROUX-EN-Y GASTRIC;  Surgeon: [name], MD;  Location: [facility];   Service: General;  Laterality: N/A;  . IRRIGATION AND DEBRIDEMENT ABSCESS N/A 01/01/2014   Procedure: IRRIGATION AND DEBRIDEMENT ABSCESS;  Surgeon: [name], MD;  Location: [facility];  Service: General;  Laterality: N/A;  . LAPAROSCOPIC LYSIS OF ADHESIONS N/A 07/05/2012   Procedure: LAPAROSCOPIC LYSIS OF ADHESIONS;  Surgeon: [name], MD;  Location: [facility];  Service: General;  Laterality: N/A;  repair of abdominal wall hernia  . right tube and ovary removed     . UPPER GI ENDOSCOPY N/A 07/05/2012   Procedure: UPPER GI ENDOSCOPY;  Surgeon: [name], MD;  Location: [facility];  Service: General;  Laterality: N/A;    There were no vitals filed for this visit.  Subjective Assessment - 06/04/18 0756    Subjective  Still having thigh/hip pain with prolonged walking and still noting some tightness in hip but notes progress after tx. at eval, able to tolerate doing some yardwork which she had been unable to do.    Currently in Pain?  Yes    Pain Score  3     Pain Location  Hip    Pain Orientation  Right;Lateral    Pain Descriptors / Indicators  Aching;[place]  Pain Type  Acute pain    Pain Radiating Towards  posterolateral hip     Pain Onset  More than a month ago    Pain Frequency  Constant    Aggravating Factors   standing and walking, activity    Pain Relieving Factors  rest, injection, topical analgeisc, dry needling, medication    Effect of Pain on Daily Activities  limits ability and tolerance for ambulation, prolonged standing and chores, difficulty performing walking exercise                       [facility] Adult PT Treatment/Exercise - 06/04/18 0001      Exercises   Exercises  Knee/Hip      Knee/Hip Exercises: Stretches   ITB Stretch  Right;3 reps;20 seconds    ITB Stretch Limitations  gentle manual stretch with knee flexed    Piriformis Stretch  Right;3 reps;20 seconds    Piriformis Stretch Limitations  gentle manual figure 4 stretch      Knee/Hip  Exercises: Aerobic   Nustep  L3 x 5 min UE/LE      Knee/Hip Exercises: Standing   Hip ADduction Limitations  attemoted but stopped after 4 reps due to lateral hip pain      Knee/Hip Exercises: Supine   Short Arc Quad Sets  AROM;Right;1 set;10 reps    Hip Adduction Isometric  Both;10 reps    Straight Leg Raises Limitations  attempted but switched to quad set and SAQ due to weakness, mild discomfort (still able to perform 4 reps without quad lag)    Other Supine Knee/Hip Exercises  clamshell red x 10 reps    Other Supine Knee/Hip Exercises  glut sets x10 reps      Manual Therapy   Manual Therapy  Joint mobilization;Soft tissue mobilization    Joint Mobilization  long axis distraction right hip grade I-III    Soft tissue mobilization  STM in left sidelying to right gluteus medius and posterolateral hip region       Trigger Point Dry Needling - 06/04/18 0837    Consent Given?  Yes    Muscles Treated Lower Body  Gluteus minimus;Piriformis   incl. gluteus medius          PT Education - 06/04/18 0846    Education Details  HEP, exercises, plan of care, muscle soreness from exercises vs. joint pain    Person(s) Educated  Patient    Methods  Explanation;Demonstration;Tactile cues;Verbal cues;Handout    Comprehension  Returned demonstration;Verbalized understanding          PT Long Term Goals - 05/25/18 2108      PT LONG TERM GOAL #1   Title  Independent with HEP    Baseline  no HEP    Time  6    Period  Weeks    Status  New    Target Date  07/06/18      PT LONG TERM GOAL #2   Title  Improve FOTO score to 34% or less impairment    Baseline  45% limited    Time  6    Period  Weeks    Status  New    Target Date  07/06/18      PT LONG TERM GOAL #3   Title  Right hip strength grossly 5/5 to improve ability for stair navigation at home and sit>stand from low chairs    Baseline  grossly 4+/5    Time  6  Period  Weeks    Status  New    Target Date  07/06/18      PT  LONG TERM GOAL #4   Title  Tolerate standing, ambulation for activities such as grocery shopping and IADLs periods at least 30-40 min with hip pain 2/10 or less    Baseline  difficulty tolerating due to hip/thigh pain    Time  6    Period  Weeks    Status  New    Target Date  07/06/18      PT LONG TERM GOAL #5   Title  Resume walking exercise program    Baseline  unable due to current pain issues    Time  6    Period  Weeks    Status  New    Target Date  07/06/18            Plan - 06/04/18 0847    Clinical Impression Statement  Good response to initial tx. to decrease hip pain and improve functional status for activity tolerance. Initial exercise focus today-pt. had difficulty tolerating SLRs due to pain but able to adapt/modify exercises to tolerance. Expect progress will be gradual with therapy goals. Continue to suspect postential multi-factorial etiology with posterolateral hip myofascial pain vs. potential joint derangement,    Clinical Presentation  Evolving    Clinical Decision Making  Moderate    Rehab Potential  Fair    PT Frequency  2x / week    PT Duration  6 weeks    PT Treatment/Interventions  ADLs/Self Care Home Management;Cryotherapy;Moist Heat;Ultrasound;Electrical Stimulation;Functional mobility training;Neuromuscular re-education;Therapeutic exercise;Therapeutic activities;Patient/family education;Manual techniques;Dry needling;Taping    PT Next Visit Plan  Continue exercises for hip ROM and strengthening, stretches as tolerated, continue STM posterolateral hip region, dry needling.    PT Home Exercise Plan  clamshells, glut set with progression to hip bridge, quad sets, SAQ, hip add. isometric, piriformis and lateral hip stretches    Consulted and Agree with Plan of Care  Patient       Patient will benefit from skilled therapeutic intervention in order to improve the following deficits and impairments:  Pain, Obesity, Difficulty walking, Decreased activity  tolerance, Decreased endurance, Decreased range of motion, Decreased strength  Visit Diagnosis: Pain in right thigh  Pain in right hip  Difficulty in walking, not elsewhere classified     Problem List Patient Active Problem List   Diagnosis Date Noted  . Right thigh pain 05/19/2018  . Melena   . Acute gastrojejunal anastomotic ulcer   . Macrocytic anemia   . Acute GI bleeding 09/28/2017  . Epigastric abdominal pain 11/19/2016  . Incisional hernia with obstruction but no gangrene 07/21/2016  . Menopause 04/22/2016  . Routine general medical examination at a health care facility 03/18/2016  . Obesity 03/18/2016  . Severe episode of recurrent major depressive disorder, without psychotic features ([place])   . Depression, major, severe recurrence ([place]) 09/11/2015  . Major depressive disorder, recurrent, severe without psychotic features ([place])   . MDD (major depressive disorder) 10/30/2014  . MDD (major depressive disorder), recurrent severe, without psychosis ([place])   . Suicidal ideation 10/05/2014  . Depression 06/12/2014  . Rash 01/04/2014  . Normocytic anemia 01/04/2014  . DJD (degenerative joint disease) 01/04/2014  . Hypertension 01/04/2014  . Dyslipidemia 01/04/2014  . GERD (gastroesophageal reflux disease) 01/04/2014  . Obstructive sleep apnea 01/04/2014  . Status post small bowel resection 01/04/2014  . Abdominal abscess 01/01/2014  . Wound infection after surgery  01/01/2014  . PTSD (post-traumatic stress disorder) 11/23/2013  . Severe recurrent major depression without psychotic features ([place]) 11/22/2013  . 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 Morbid obesity, Weight - 312, BMI - 45.2 04/21/2012    Beaulah Dinning, PT, DPT 06/04/18 8:52 AM  [facility] [address] [place], [place], [address] Phone: [phone]   Fax:  [phone]  Name: Brittany [name] MRN: [id number] Date of Birth:  July 14, 1965

## 2018-06-07 ENCOUNTER — Ambulatory Visit: Payer: 59 | Admitting: Physical Therapy

## 2018-06-07 ENCOUNTER — Encounter: Payer: Self-pay | Admitting: Physical Therapy

## 2018-06-07 DIAGNOSIS — M79651 Pain in right thigh: Secondary | ICD-10-CM | POA: Diagnosis not present

## 2018-06-07 DIAGNOSIS — M25551 Pain in right hip: Secondary | ICD-10-CM

## 2018-06-07 DIAGNOSIS — R262 Difficulty in walking, not elsewhere classified: Secondary | ICD-10-CM

## 2018-06-07 NOTE — Therapy (Signed)
[place], [place], [address] Phone: [phone]   Fax:  [phone]  Physical Therapy Treatment  Patient Details  Name: Brittany [name] MRN: [id number] Date of Birth: 12-04-1965 Referring Provider (PT): Clearance Coots, MD   Encounter Date: 06/07/2018  PT End of Session - 06/07/18 1408    Visit Number  3    Number of Visits  12    Date for PT Re-Evaluation  07/06/18    Authorization Type  [employer]    PT Start Time  1326    PT Stop Time  1412    PT Time Calculation (min)  46 min    Activity Tolerance  Patient tolerated treatment well    Behavior During Therapy  [facility] for tasks assessed/performed       Past Medical History:  Diagnosis Date  . Allergy   . Anxiety   . Arthritis    hips, knees and hands   . Blood transfusion without reported diagnosis   . Depression   . GERD (gastroesophageal reflux disease)    hx of   . H/O hiatal hernia   . History of chicken pox   . Hypercholesterolemia   . Hypertension   . Migraine   . Migraines   . Morbid obesity ([place])   . Positive TB test   . PTSD (post-traumatic stress disorder)   . Sleep apnea    no CPAP machine   . Ulcer, stomach peptic     Past Surgical History:  Procedure Laterality Date  . ABDOMINAL HYSTERECTOMY  1997 & 2006  . BREATH TEK H PYLORI  05/03/2012   Procedure: BREATH TEK H PYLORI;  Surgeon: [name], MD;  Location: [name];  Service: General;  Laterality: N/A;  . COLONOSCOPY     hx of benign polyps   . ESOPHAGOGASTRODUODENOSCOPY N/A 09/29/2017   Procedure: ESOPHAGOGASTRODUODENOSCOPY (EGD);  Surgeon: Ladene Artist, MD;  Location: [name];  Service: Endoscopy;  Laterality: N/A;  . FOOT SURGERY    . GASTRIC BYPASS    . GASTRIC ROUX-EN-Y N/A 07/05/2012   Procedure: LAPAROSCOPIC ROUX-EN-Y GASTRIC;  Surgeon: [name], MD;  Location: [facility];  Service: General;  Laterality: N/A;  . IRRIGATION AND DEBRIDEMENT ABSCESS N/A  01/01/2014   Procedure: IRRIGATION AND DEBRIDEMENT ABSCESS;  Surgeon: [name], MD;  Location: [facility];  Service: General;  Laterality: N/A;  . LAPAROSCOPIC LYSIS OF ADHESIONS N/A 07/05/2012   Procedure: LAPAROSCOPIC LYSIS OF ADHESIONS;  Surgeon: [name], MD;  Location: [facility];  Service: General;  Laterality: N/A;  repair of abdominal wall hernia  . right tube and ovary removed     . UPPER GI ENDOSCOPY N/A 07/05/2012   Procedure: UPPER GI ENDOSCOPY;  Surgeon: [name], MD;  Location: [facility];  Service: General;  Laterality: N/A;    There were no vitals filed for this visit.  Subjective Assessment - 06/07/18 1328    Subjective  Had some pain grocery shopping over the weekend. She reports did HEP and found helpful. Also reports improved tolerance for chores.    Currently in Pain?  Yes    Pain Score  3     Pain Location  Hip    Pain Orientation  Right;Lateral    Pain Descriptors / Indicators  Dull    Pain Type  Acute pain    Pain Radiating Towards  posterolateral hip    Pain Onset  More than a month ago  Pain Frequency  Constant    Aggravating Factors   walking, prolonged standing, stairs    Pain Relieving Factors  rest, injections, medication, dry needling    Effect of Pain on Daily Activities  limits tolerance standing and ambulation, ability for chores, yardwork.                       [place] Adult PT Treatment/Exercise - 06/07/18 0001      Knee/Hip Exercises: Stretches   Hip Flexor Stretch  Right;3 reps;20 seconds    Hip Flexor Stretch Limitations  supine manual stretch    ITB Stretch  Right;3 reps;30 seconds    ITB Stretch Limitations  supine manual stretch    Piriformis Stretch  Right;3 reps;30 seconds    Piriformis Stretch Limitations  supine manual stretch      Knee/Hip Exercises: Seated   Long Arc Quad  Right;2 sets;10 reps    Long Arc Quad Weight  2 lbs.    Clamshell with [employer]raBand  Red   2x10     Knee/Hip Exercises: Supine   Bridges  Limitations  partial bridge with legs on bolster x 10 reps    Other Supine Knee/Hip Exercises  supine hip abd 2x10 right      Manual Therapy   Joint Mobilization  long axis distraction right grade I-III    Soft tissue mobilization  STM right lateral hip, glut med and pirformis       Trigger Point Dry Needling - 06/07/18 1406    Consent Given?  Yes    Muscles Treated Lower Body  Gluteus minimus;Piriformis   75 mm 30 gauge in left sidelying, incl. gluteus medius also   Gluteus Minimus Response  Palpable increased muscle length    Piriformis Response  Palpable increased muscle length           PT Education - 06/07/18 1408    Education Details  POC, hip anatomy    Person(s) Educated  Patient    Methods  Explanation    Comprehension  Verbalized understanding          PT Long Term Goals - 05/25/18 2108      PT LONG TERM GOAL #1   Title  Independent with HEP    Baseline  no HEP    Time  6    Period  Weeks    Status  New    Target Date  07/06/18      PT LONG TERM GOAL #2   Title  Improve FOTO score to 34% or less impairment    Baseline  45% limited    Time  6    Period  Weeks    Status  New    Target Date  07/06/18      PT LONG TERM GOAL #3   Title  Right hip strength grossly 5/5 to improve ability for stair navigation at home and sit>stand from low chairs    Baseline  grossly 4+/5    Time  6    Period  Weeks    Status  New    Target Date  07/06/18      PT LONG TERM GOAL #4   Title  Tolerate standing, ambulation for activities such as grocery shopping and IADLs periods at least 30-40 min with hip pain 2/10 or less    Baseline  difficulty tolerating due to hip/thigh pain    Time  6    Period  Weeks    Status  New    Target Date  07/06/18      PT LONG TERM GOAL #5   Title  Resume walking exercise program    Baseline  unable due to current pain issues    Time  6    Period  Weeks    Status  New    Target Date  07/06/18            Plan - 06/07/18  1408    Clinical Impression Statement  Still with right hip/thigh soreness but improving from baseline with tolerance for exercises and functional activities for ambulation and chores, yardwork. Exercise progression as noted perf flowsheet well-tolerated.    PT Frequency  2x / week    PT Duration  6 weeks    PT Treatment/Interventions  ADLs/Self Care Home Management;Cryotherapy;Moist Heat;Ultrasound;Electrical Stimulation;Functional mobility training;Neuromuscular re-education;Therapeutic exercise;Therapeutic activities;Patient/family education;Manual techniques;Dry needling;Taping    PT Next Visit Plan  Continue exercises for hip ROM and strengthening, stretches as tolerated, continue STM posterolateral hip region, dry needling.    PT Home Exercise Plan  clamshells, glut set with progression to hip bridge, quad sets, SAQ, hip add. isometric, piriformis and lateral hip stretches    Consulted and Agree with Plan of Care  Patient       Patient will benefit from skilled therapeutic intervention in order to improve the following deficits and impairments:  Pain, Obesity, Difficulty walking, Decreased activity tolerance, Decreased endurance, Decreased range of motion, Decreased strength  Visit Diagnosis: Pain in right thigh  Pain in right hip  Difficulty in walking, not elsewhere classified     Problem List Patient Active Problem List   Diagnosis Date Noted  . Right thigh pain 05/19/2018  . Melena   . Acute gastrojejunal anastomotic ulcer   . Macrocytic anemia   . Acute GI bleeding 09/28/2017  . Epigastric abdominal pain 11/19/2016  . Incisional hernia with obstruction but no gangrene 07/21/2016  . Menopause 04/22/2016  . Routine general medical examination at a health care facility 03/18/2016  . Obesity 03/18/2016  . Severe episode of recurrent major depressive disorder, without psychotic features ([place])   . Depression, major, severe recurrence ([place]) 09/11/2015  . Major depressive  disorder, recurrent, severe without psychotic features ([place])   . MDD (major depressive disorder) 10/30/2014  . MDD (major depressive disorder), recurrent severe, without psychosis ([place])   . Suicidal ideation 10/05/2014  . Depression 06/12/2014  . Rash 01/04/2014  . Normocytic anemia 01/04/2014  . DJD (degenerative joint disease) 01/04/2014  . Hypertension 01/04/2014  . Dyslipidemia 01/04/2014  . GERD (gastroesophageal reflux disease) 01/04/2014  . Obstructive sleep apnea 01/04/2014  . Status post small bowel resection 01/04/2014  . Abdominal abscess 01/01/2014  . Wound infection after surgery 01/01/2014  . PTSD (post-traumatic stress disorder) 11/23/2013  . Severe recurrent major depression without psychotic features ([place]) 11/22/2013  . 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 Morbid obesity, Weight - 312, BMI - 45.2 04/21/2012   Beaulah Dinning, PT, DPT 06/07/18 2:15 PM  [facility] [address] [place], [place], [address] Phone: [phone]   Fax:  [phone]  Name: Brittany [name] MRN: [id number] Date of Birth: [birth year]

## 2018-06-09 ENCOUNTER — Ambulatory Visit: Payer: 59 | Admitting: Physical Therapy

## 2018-06-09 ENCOUNTER — Encounter: Payer: Self-pay | Admitting: Physical Therapy

## 2018-06-09 DIAGNOSIS — M25551 Pain in right hip: Secondary | ICD-10-CM

## 2018-06-09 DIAGNOSIS — M79651 Pain in right thigh: Secondary | ICD-10-CM | POA: Diagnosis not present

## 2018-06-09 DIAGNOSIS — R262 Difficulty in walking, not elsewhere classified: Secondary | ICD-10-CM

## 2018-06-09 NOTE — Therapy (Signed)
[place], [place], [address] Phone: [phone]   Fax:  [phone]  Physical Therapy Treatment  Patient Details  Name: Brittany [name] MRN: [id number] Date of Birth: 05/14/1965 Referring Provider (PT): Clearance Coots, MD   Encounter Date: 06/09/2018  PT End of Session - 06/09/18 1234    Visit Number  4    Number of Visits  12    Date for PT Re-Evaluation  07/06/18    Authorization Type  Aetna    PT Start Time  0300    PT Stop Time  1228    PT Time Calculation (min)  43 min    Activity Tolerance  Patient tolerated treatment well    Behavior During Therapy  [facility] for tasks assessed/performed       Past Medical History:  Diagnosis Date  . Allergy   . Anxiety   . Arthritis    hips, knees and hands   . Blood transfusion without reported diagnosis   . Depression   . GERD (gastroesophageal reflux disease)    hx of   . H/O hiatal hernia   . History of chicken pox   . Hypercholesterolemia   . Hypertension   . Migraine   . Migraines   . Morbid obesity ([place])   . Positive TB test   . PTSD (post-traumatic stress disorder)   . Sleep apnea    no CPAP machine   . Ulcer, stomach peptic     Past Surgical History:  Procedure Laterality Date  . ABDOMINAL HYSTERECTOMY  1997 & 2006  . BREATH TEK H PYLORI  05/03/2012   Procedure: BREATH TEK H PYLORI;  Surgeon: [name], MD;  Location: [name];  Service: General;  Laterality: N/A;  . COLONOSCOPY     hx of benign polyps   . ESOPHAGOGASTRODUODENOSCOPY N/A 09/29/2017   Procedure: ESOPHAGOGASTRODUODENOSCOPY (EGD);  Surgeon: Ladene Artist, MD;  Location: [name];  Service: Endoscopy;  Laterality: N/A;  . FOOT SURGERY    . GASTRIC BYPASS    . GASTRIC ROUX-EN-Y N/A 07/05/2012   Procedure: LAPAROSCOPIC ROUX-EN-Y GASTRIC;  Surgeon: [name], MD;  Location: [facility];  Service: General;  Laterality: N/A;  . IRRIGATION AND DEBRIDEMENT ABSCESS N/A  01/01/2014   Procedure: IRRIGATION AND DEBRIDEMENT ABSCESS;  Surgeon: [name], MD;  Location: [facility];  Service: General;  Laterality: N/A;  . LAPAROSCOPIC LYSIS OF ADHESIONS N/A 07/05/2012   Procedure: LAPAROSCOPIC LYSIS OF ADHESIONS;  Surgeon: [name], MD;  Location: [facility];  Service: General;  Laterality: N/A;  repair of abdominal wall hernia  . right tube and ovary removed     . UPPER GI ENDOSCOPY N/A 07/05/2012   Procedure: UPPER GI ENDOSCOPY;  Surgeon: [name], MD;  Location: [facility];  Service: General;  Laterality: N/A;    There were no vitals filed for this visit.  Subjective Assessment - 06/09/18 1221    Subjective  Sore after last session but still feels treatment is helping-able to take trash can to/from curb (was previously unable).                       [place] Adult PT Treatment/Exercise - 06/09/18 0001      Knee/Hip Exercises: Stretches   Piriformis Stretch  Right;3 reps;30 seconds    Other Knee/Hip Stretches  Right single knee to chest/glut stretch 3 x 30 sec      Knee/Hip Exercises: Seated  Long Arc [employer]  Strengthening;Right;2 sets;10 reps    Hamstring Curl  Strengthening;Right;15 reps    Hamstring Limitations  Red Theraband      Knee/Hip Exercises: Supine   Bridges Limitations  bridge with legs on bolster x 15 reps      Knee/Hip Exercises: Sidelying   Clams  Right x 10 reps      Modalities   Modalities  Electrical Stimulation      Electrical Stimulation   Electrical Stimulation Location  Right posterolateral hip   applied via needles   Electrical Stimulation Action  TENS    Electrical Stimulation Parameters  2 pps to tolerance x 10 min    Electrical Stimulation Goals  Tone;Pain      Manual Therapy   Joint Mobilization  long axis distraction right hip grade I-IV    Soft tissue mobilization  STM right posterolateral hip region       Trigger Point Dry Needling - 06/09/18 1232    Consent Given?  Yes    Muscles Treated  Lower Body  Gluteus minimus;Piriformis   incl. gluteus medius   Gluteus Minimus Response  Palpable increased muscle length    Piriformis Response  Palpable increased muscle length           PT Education - 06/09/18 1234    Education Details  TENS parameters    Person(s) Educated  Patient    Methods  Explanation    Comprehension  Verbalized understanding          PT Long Term Goals - 05/25/18 2108      PT LONG TERM GOAL #1   Title  Independent with HEP    Baseline  no HEP    Time  6    Period  Weeks    Status  New    Target Date  07/06/18      PT LONG TERM GOAL #2   Title  Improve FOTO score to 34% or less impairment    Baseline  45% limited    Time  6    Period  Weeks    Status  New    Target Date  07/06/18      PT LONG TERM GOAL #3   Title  Right hip strength grossly 5/5 to improve ability for stair navigation at home and sit>stand from low chairs    Baseline  grossly 4+/5    Time  6    Period  Weeks    Status  New    Target Date  07/06/18      PT LONG TERM GOAL #4   Title  Tolerate standing, ambulation for activities such as grocery shopping and IADLs periods at least 30-40 min with hip pain 2/10 or less    Baseline  difficulty tolerating due to hip/thigh pain    Time  6    Period  Weeks    Status  New    Target Date  07/06/18      PT LONG TERM GOAL #5   Title  Resume walking exercise program    Baseline  unable due to current pain issues    Time  6    Period  Weeks    Status  New    Target Date  07/06/18            Plan - 06/09/18 1235    Clinical Impression Statement  Continued previous tx. focus to right posterolateral hip region with addition estim for pain relief and decreased  tone. Still with limited activity tolerance from hip pain but functional status continues to gradually improve.    Rehab Potential  Fair    Clinical Impairments Affecting Rehab Potential  Time since onset, potential for underlying intrinsic hip pathology    PT  Frequency  2x / week    PT Duration  6 weeks    PT Treatment/Interventions  ADLs/Self Care Home Management;Cryotherapy;Moist Heat;Ultrasound;Electrical Stimulation;Functional mobility training;Neuromuscular re-education;Therapeutic exercise;Therapeutic activities;Patient/family education;Manual techniques;Dry needling;Taping    PT Next Visit Plan  Check response use estim and continue as found beneficial, continue exercises for hip ROM and strengthening, stretches as tolerated, continue STM posterolateral hip region, dry needling.    PT Home Exercise Plan  clamshells, glut set with progression to hip bridge, quad sets, SAQ, hip add. isometric, piriformis and lateral hip stretches    Consulted and Agree with Plan of Care  Patient       Patient will benefit from skilled therapeutic intervention in order to improve the following deficits and impairments:  Pain, Obesity, Difficulty walking, Decreased activity tolerance, Decreased endurance, Decreased range of motion, Decreased strength  Visit Diagnosis: Pain in right thigh  Pain in right hip  Difficulty in walking, not elsewhere classified     Problem List Patient Active Problem List   Diagnosis Date Noted  . Right thigh pain 05/19/2018  . Melena   . Acute gastrojejunal anastomotic ulcer   . Macrocytic anemia   . Acute GI bleeding 09/28/2017  . Epigastric abdominal pain 11/19/2016  . Incisional hernia with obstruction but no gangrene 07/21/2016  . Menopause 04/22/2016  . Routine general medical examination at a health care facility 03/18/2016  . Obesity 03/18/2016  . Severe episode of recurrent major depressive disorder, without psychotic features ([place])   . Depression, major, severe recurrence ([place]) 09/11/2015  . Major depressive disorder, recurrent, severe without psychotic features ([place])   . MDD (major depressive disorder) 10/30/2014  . MDD (major depressive disorder), recurrent severe, without psychosis ([place])   . Suicidal  ideation 10/05/2014  . Depression 06/12/2014  . Rash 01/04/2014  . Normocytic anemia 01/04/2014  . DJD (degenerative joint disease) 01/04/2014  . Hypertension 01/04/2014  . Dyslipidemia 01/04/2014  . GERD (gastroesophageal reflux disease) 01/04/2014  . Obstructive sleep apnea 01/04/2014  . Status post small bowel resection 01/04/2014  . Abdominal abscess 01/01/2014  . Wound infection after surgery 01/01/2014  . PTSD (post-traumatic stress disorder) 11/23/2013  . Severe recurrent major depression without psychotic features ([place]) 11/22/2013  . 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 Morbid obesity, Weight - 312, BMI - 45.2 04/21/2012    Beaulah Dinning, PT, DPT 06/09/18 12:40 PM  [facility] [address] [place], [place], [address] Phone: [phone]   Fax:  [phone]  Name: Brittany [name] MRN: [id number] Date of Birth: 1966-03-23

## 2018-06-15 ENCOUNTER — Ambulatory Visit: Payer: 59 | Attending: Family Medicine | Admitting: Physical Therapy

## 2018-06-15 ENCOUNTER — Encounter: Payer: Self-pay | Admitting: Physical Therapy

## 2018-06-15 DIAGNOSIS — M25551 Pain in right hip: Secondary | ICD-10-CM | POA: Insufficient documentation

## 2018-06-15 DIAGNOSIS — R262 Difficulty in walking, not elsewhere classified: Secondary | ICD-10-CM | POA: Diagnosis present

## 2018-06-15 DIAGNOSIS — M79651 Pain in right thigh: Secondary | ICD-10-CM | POA: Insufficient documentation

## 2018-06-15 NOTE — Therapy (Signed)
[place], [place], [address] Phone: [phone]   Fax:  [phone]  Physical Therapy Treatment  Patient Details  Name: Brittany [name] MRN: [id number] Date of Birth: [birth year] Referring Provider (PT): Clearance Coots, MD   Encounter Date: 06/15/2018  PT End of Session - 06/15/18 1716    Visit Number  5    Number of Visits  12    Date for PT Re-Evaluation  07/06/18    Authorization Type  Aet[employer]    PT Start Time  1630    PT Stop Time  1713    PT Time Calculation (min)  43 min    Activity Tolerance  Patient tolerated treatment well    Behavior During Therapy  [facility] for tasks assessed/performed       Past Medical History:  Diagnosis Date  . Allergy   . Anxiety   . Arthritis    hips, knees and hands   . Blood transfusion without reported diagnosis   . Depression   . GERD (gastroesophageal reflux disease)    hx of   . H/O hiatal hernia   . History of chicken pox   . Hypercholesterolemia   . Hypertension   . Migraine   . Migraines   . Morbid obesity ([place])   . Positive TB test   . PTSD (post-traumatic stress disorder)   . Sleep apnea    no CPAP machine   . Ulcer, stomach peptic     Past Surgical History:  Procedure Laterality Date  . ABDOMINAL HYSTERECTOMY  1997 & 2006  . BREATH TEK H PYLORI  05/03/2012   Procedure: BREATH TEK H PYLORI;  Surgeon: [name], MD;  Location: [name];  Service: General;  Laterality: N/A;  . COLONOSCOPY     hx of benign polyps   . ESOPHAGOGASTRODUODENOSCOPY N/A 09/29/2017   Procedure: ESOPHAGOGASTRODUODENOSCOPY (EGD);  Surgeon: Ladene Artist, MD;  Location: [name];  Service: Endoscopy;  Laterality: N/A;  . FOOT SURGERY    . GASTRIC BYPASS    . GASTRIC ROUX-EN-Y N/A 07/05/2012   Procedure: LAPAROSCOPIC ROUX-EN-Y GASTRIC;  Surgeon: [name], MD;  Location: [facility];  Service: General;  Laterality: N/A;  . IRRIGATION AND DEBRIDEMENT ABSCESS N/A  01/01/2014   Procedure: IRRIGATION AND DEBRIDEMENT ABSCESS;  Surgeon: [name], MD;  Location: [facility];  Service: General;  Laterality: N/A;  . LAPAROSCOPIC LYSIS OF ADHESIONS N/A 07/05/2012   Procedure: LAPAROSCOPIC LYSIS OF ADHESIONS;  Surgeon: [name], MD;  Location: [facility];  Service: General;  Laterality: N/A;  repair of abdominal wall hernia  . right tube and ovary removed     . UPPER GI ENDOSCOPY N/A 07/05/2012   Procedure: UPPER GI ENDOSCOPY;  Surgeon: [name], MD;  Location: [facility];  Service: General;  Laterality: N/A;    There were no vitals filed for this visit.  Subjective Assessment - 06/15/18 1631    Subjective  Some soreness/pain after doing some yardwork over the weekend. Reports HEP helpful and like estim last session/felt helpful for symptoms.    Currently in Pain?  Yes    Pain Score  3     Pain Location  Hip    Pain Orientation  Lateral;Right    Pain Descriptors / Indicators  Dull    Pain Type  Acute pain    Pain Radiating Towards  posterolateral hip    Pain Onset  More than a month ago  Pain Frequency  Constant    Aggravating Factors   walking, standing, stairs, yardwork    Pain Relieving Factors  rest    Effect of Pain on Daily Activities  limits tolerance prolonged standing and ambulation, ability IADLs                       [facility] Adult PT Treatment/Exercise - 06/15/18 0001      Knee/Hip Exercises: Stretches   Piriformis Stretch  Right;3 reps;30 seconds      Knee/Hip Exercises: Supine   Bridges with Clamshell  --   red band  x 10 reps     Knee/Hip Exercises: Sidelying   Clams  Right side 2x10      Manual Therapy   Soft tissue mobilization  STM right posterolateral hip       Trigger Point Dry Needling - 06/15/18 0001    Consent Given?  Yes    Muscles Treated Back/Hip  Gluteus minimus;Gluteus medius;Piriformis    Dry Needling Comments  75 mm 30 gauge needles in left sidelying    Electrical Stimulation Performed  with Dry Needling  Yes    E-stim with Dry Needling Details  TENS 2 pps x 10 min to tolerance           PT Education - 06/15/18 1715    Education Details  hip muscle anatomy    Person(s) Educated  Patient    Methods  Explanation;Demonstration    Comprehension  Verbalized understanding          PT Long Term Goals - 05/25/18 2108      PT LONG TERM GOAL #1   Title  Independent with HEP    Baseline  no HEP    Time  6    Period  Weeks    Status  New    Target Date  07/06/18      PT LONG TERM GOAL #2   Title  Improve FOTO score to 34% or less impairment    Baseline  45% limited    Time  6    Period  Weeks    Status  New    Target Date  07/06/18      PT LONG TERM GOAL #3   Title  Right hip strength grossly 5/5 to improve ability for stair navigation at home and sit>stand from low chairs    Baseline  grossly 4+/5    Time  6    Period  Weeks    Status  New    Target Date  07/06/18      PT LONG TERM GOAL #4   Title  Tolerate standing, ambulation for activities such as grocery shopping and IADLs periods at least 30-40 min with hip pain 2/10 or less    Baseline  difficulty tolerating due to hip/thigh pain    Time  6    Period  Weeks    Status  New    Target Date  07/06/18      PT LONG TERM GOAL #5   Title  Resume walking exercise program    Baseline  unable due to current pain issues    Time  6    Period  Weeks    Status  New    Target Date  07/06/18            Plan - 06/15/18 1716    Clinical Impression Statement  Still with pain limiting mobility tolerance but improving from baseline status  with functional activity tolerance. Primary symptoms recently consistent with muscular etiology for right posterolateral hip region.,    Stability/Clinical Decision Making  Evolving/Moderate complexity    Clinical Decision Making  Moderate    Rehab Potential  Fair    Clinical Impairments Affecting Rehab Potential  Time since onset, potential for underlying intrinsic  hip pathology    PT Frequency  2x / week    PT Duration  6 weeks    PT Treatment/Interventions  ADLs/Self Care Home Management;Cryotherapy;Moist Heat;Ultrasound;Electrical Stimulation;Functional mobility training;Neuromuscular re-education;Therapeutic exercise;Therapeutic activities;Patient/family education;Manual techniques;Dry needling;Taping    PT Next Visit Plan  continue exercises for hip ROM and strengthening, stretches as tolerated, continue STM posterolateral hip region, dry needling, estim and other modalities prn    PT Home Exercise Plan  clamshells, glut set with progression to hip bridge, quad sets, SAQ, hip add. isometric, piriformis and lateral hip stretches    Consulted and Agree with Plan of Care  Patient       Patient will benefit from skilled therapeutic intervention in order to improve the following deficits and impairments:  Pain, Obesity, Difficulty walking, Decreased activity tolerance, Decreased endurance, Decreased range of motion, Decreased strength  Visit Diagnosis: Pain in right thigh  Pain in right hip  Difficulty in walking, not elsewhere classified     Problem List Patient Active Problem List   Diagnosis Date Noted  . Right thigh pain 05/19/2018  . Melena   . Acute gastrojejunal anastomotic ulcer   . Macrocytic anemia   . Acute GI bleeding 09/28/2017  . Epigastric abdominal pain 11/19/2016  . Incisional hernia with obstruction but no gangrene 07/21/2016  . Menopause 04/22/2016  . Routine general medical examination at a health care facility 03/18/2016  . Obesity 03/18/2016  . Severe episode of recurrent major depressive disorder, without psychotic features ([place])   . Depression, major, severe recurrence ([place]) 09/11/2015  . Major depressive disorder, recurrent, severe without psychotic features ([place])   . MDD (major depressive disorder) 10/30/2014  . MDD (major depressive disorder), recurrent severe, without psychosis ([place])   . Suicidal ideation  10/05/2014  . Depression 06/12/2014  . Rash 01/04/2014  . Normocytic anemia 01/04/2014  . DJD (degenerative joint disease) 01/04/2014  . Hypertension 01/04/2014  . Dyslipidemia 01/04/2014  . GERD (gastroesophageal reflux disease) 01/04/2014  . Obstructive sleep apnea 01/04/2014  . Status post small bowel resection 01/04/2014  . Abdominal abscess 01/01/2014  . Wound infection after surgery 01/01/2014  . PTSD (post-traumatic stress disorder) 11/23/2013  . Severe recurrent major depression without psychotic features ([place]) 11/22/2013  . 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 Morbid obesity, Weight - 312, BMI - 45.2 04/21/2012    Beaulah Dinning, PT, DPT 06/15/18 5:19 PM  [facility] [address] [place], [place], [address] Phone: [phone]   Fax:  [phone]  Name: VIRDA BETTERS MRN: [id number] Date of Birth: 11-Jul-1965

## 2018-06-17 ENCOUNTER — Encounter: Payer: Self-pay | Admitting: Physical Therapy

## 2018-06-17 ENCOUNTER — Ambulatory Visit: Payer: 59 | Admitting: Physical Therapy

## 2018-06-17 DIAGNOSIS — M79651 Pain in right thigh: Secondary | ICD-10-CM

## 2018-06-17 DIAGNOSIS — R262 Difficulty in walking, not elsewhere classified: Secondary | ICD-10-CM

## 2018-06-17 DIAGNOSIS — M25551 Pain in right hip: Secondary | ICD-10-CM

## 2018-06-17 NOTE — Therapy (Signed)
[place], [place], [address] Phone: [phone]   Fax:  [phone]  Physical Therapy Treatment  Patient Details  Name: Brittany [name] MRN: [id number] Date of Birth: 01-Dec-1965 Referring Provider (PT): Clearance Coots, MD   Encounter Date: 06/17/2018  PT End of Session - 06/17/18 1631    Visit Number  6    Number of Visits  12    Date for PT Re-Evaluation  07/06/18    Authorization Type  Aetna    PT Start Time  1628    PT Stop Time  1710    PT Time Calculation (min)  42 min    Activity Tolerance  Patient tolerated treatment well    Behavior During Therapy  [facility] for tasks assessed/performed       Past Medical History:  Diagnosis Date  . Allergy   . Anxiety   . Arthritis    hips, knees and hands   . Blood transfusion without reported diagnosis   . Depression   . GERD (gastroesophageal reflux disease)    hx of   . H/O hiatal hernia   . History of chicken pox   . Hypercholesterolemia   . Hypertension   . Migraine   . Migraines   . Morbid obesity ([place])   . Positive TB test   . PTSD (post-traumatic stress disorder)   . Sleep apnea    no CPAP machine   . Ulcer, stomach peptic     Past Surgical History:  Procedure Laterality Date  . ABDOMINAL HYSTERECTOMY  1997 & 2006  . BREATH TEK H PYLORI  05/03/2012   Procedure: BREATH TEK H PYLORI;  Surgeon: [name], MD;  Location: [name];  Service: General;  Laterality: N/A;  . COLONOSCOPY     hx of benign polyps   . ESOPHAGOGASTRODUODENOSCOPY N/A 09/29/2017   Procedure: ESOPHAGOGASTRODUODENOSCOPY (EGD);  Surgeon: Ladene Artist, MD;  Location: [name];  Service: Endoscopy;  Laterality: N/A;  . FOOT SURGERY    . GASTRIC BYPASS    . GASTRIC ROUX-EN-Y N/A 07/05/2012   Procedure: LAPAROSCOPIC ROUX-EN-Y GASTRIC;  Surgeon: [name], MD;  Location: [facility];  Service: General;  Laterality: N/A;  . IRRIGATION AND DEBRIDEMENT ABSCESS N/A  01/01/2014   Procedure: IRRIGATION AND DEBRIDEMENT ABSCESS;  Surgeon: [name], MD;  Location: [facility];  Service: General;  Laterality: N/A;  . LAPAROSCOPIC LYSIS OF ADHESIONS N/A 07/05/2012   Procedure: LAPAROSCOPIC LYSIS OF ADHESIONS;  Surgeon: [name], MD;  Location: [facility];  Service: General;  Laterality: N/A;  repair of abdominal wall hernia  . right tube and ovary removed     . UPPER GI ENDOSCOPY N/A 07/05/2012   Procedure: UPPER GI ENDOSCOPY;  Surgeon: [name], MD;  Location: [facility];  Service: General;  Laterality: N/A;    There were no vitals filed for this visit.  Subjective Assessment - 06/17/18 1630    Subjective  Pt. reports sore from exercises last session but overall feels "movement" helpful. Discussed status and pt. in agreement with plan for more exercise focus today.         [facility] PT Assessment - 06/17/18 0001      AROM   Right Hip Flexion  120    Right Hip External Rotation   40    Right Hip Internal Rotation   20    Right Hip ABduction  50  [facility] Adult PT Treatment/Exercise - 06/17/18 0001      Knee/Hip Exercises: Stretches   Piriformis Stretch  Right;3 reps;30 seconds      Knee/Hip Exercises: Aerobic   Nustep  L4 x 5 min UE/LE      Knee/Hip Exercises: Standing   Forward Step Up  Right;20 reps;Step Height: 4"    Functional Squat Limitations  partial squat at counter 2x10    Other Standing Knee Exercises  Monster walk red band proximal to knees 2x10 feet      Knee/Hip Exercises: Seated   Long Arc Quad  Strengthening;Right;2 sets;10 reps    Long Arc Quad Weight  3 lbs.    Other Seated Knee/Hip Exercises  seated clamshell red band 2x10      Manual Therapy   Joint Mobilization  long axis hip distraction grade I-IV    Soft tissue mobilization  STM right posterolateral hip in prone incl. piriformis release with passove hip ER and foam roll right piriformis and lateral glut region             PT  Education - 06/17/18 1711    Education Details  POC, exercises    Methods  Explanation;Demonstration;Verbal cues    Comprehension  Verbalized understanding;Returned demonstration          PT Long Term Goals - 06/17/18 1713      PT LONG TERM GOAL #1   Title  Independent with HEP    Baseline  met for initial HEP, will update prn    Time  6    Period  Weeks      PT LONG TERM GOAL #2   Title  Improve FOTO score to 34% or less impairment    Baseline  45% limited at eval, not retested today    Time  6    Period  Weeks    Status  On-going      PT LONG TERM GOAL #3   Title  Right hip strength grossly 5/5 to improve ability for stair navigation at home and sit>stand from low chairs    Baseline  grossly 4+/5    Time  6    Period  Weeks    Status  On-going      PT LONG TERM GOAL #4   Title  Tolerate standing, ambulation for activities such as grocery shopping and IADLs periods at least 30-40 min with hip pain 2/10 or less    Baseline  Improving from baseline but pain >2/10    Time  6    Period  Weeks    Status  On-going      PT LONG TERM GOAL #5   Title  Resume walking exercise program    Baseline  still has not resumed    Time  6    Period  Weeks    Status  On-going            Plan - 06/17/18 1712    Clinical Impression Statement  Pt. making improvement in right hip ROM with functional gains for activities such as donning shoes. Walking tolerance still limited but improving with this from baseline status.    Stability/Clinical Decision Making  Evolving/Moderate complexity    Clinical Decision Making  Moderate    Rehab Potential  Fair    PT Frequency  2x / week    PT Duration  6 weeks    PT Treatment/Interventions  ADLs/Self Care Home Management;Cryotherapy;Moist Heat;Ultrasound;Electrical Stimulation;Functional mobility training;Neuromuscular re-education;Therapeutic exercise;Therapeutic activities;Patient/family education;Manual techniques;Dry  needling;Taping    PT  Next Visit Plan  continue exercises for hip ROM and strengthening, stretches as tolerated, continue STM posterolateral hip region, dry needling, estim and other modalities prn    PT Home Exercise Plan  clamshells, glut set with progression to hip bridge, quad sets, SAQ, hip add. isometric, piriformis and lateral hip stretches    Consulted and Agree with Plan of Care  Patient       Patient will benefit from skilled therapeutic intervention in order to improve the following deficits and impairments:  Pain, Obesity, Difficulty walking, Decreased activity tolerance, Decreased endurance, Decreased range of motion, Decreased strength  Visit Diagnosis: Pain in right thigh  Pain in right hip  Difficulty in walking, not elsewhere classified     Problem List Patient Active Problem List   Diagnosis Date Noted  . Right thigh pain 05/19/2018  . Melena   . Acute gastrojejunal anastomotic ulcer   . Macrocytic anemia   . Acute GI bleeding 09/28/2017  . Epigastric abdominal pain 11/19/2016  . Incisional hernia with obstruction but no gangrene 07/21/2016  . Menopause 04/22/2016  . Routine general medical examination at a health care facility 03/18/2016  . Obesity 03/18/2016  . Severe episode of recurrent major depressive disorder, without psychotic features ([place])   . Depression, major, severe recurrence ([place]) 09/11/2015  . Major depressive disorder, recurrent, severe without psychotic features ([place])   . MDD (major depressive disorder) 10/30/2014  . MDD (major depressive disorder), recurrent severe, without psychosis ([place])   . Suicidal ideation 10/05/2014  . Depression 06/12/2014  . Rash 01/04/2014  . Normocytic anemia 01/04/2014  . DJD (degenerative joint disease) 01/04/2014  . Hypertension 01/04/2014  . Dyslipidemia 01/04/2014  . GERD (gastroesophageal reflux disease) 01/04/2014  . Obstructive sleep apnea 01/04/2014  . Status post small bowel resection 01/04/2014  . Abdominal abscess  01/01/2014  . Wound infection after surgery 01/01/2014  . PTSD (post-traumatic stress disorder) 11/23/2013  . Severe recurrent major depression without psychotic features ([place]) 11/22/2013  . 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 Morbid obesity, Weight - 312, BMI - 45.2 04/21/2012   Beaulah Dinning, PT, DPT 06/17/18 5:14 PM  [facility] [address] [place], [place], [address] Phone: [phone]   Fax:  [phone]  Name: WAFAA DEEMER MRN: [id number] Date of Birth: 08/19/1965

## 2018-06-22 ENCOUNTER — Ambulatory Visit: Payer: 59 | Admitting: Physical Therapy

## 2018-06-22 DIAGNOSIS — R262 Difficulty in walking, not elsewhere classified: Secondary | ICD-10-CM

## 2018-06-22 DIAGNOSIS — M79651 Pain in right thigh: Secondary | ICD-10-CM

## 2018-06-22 DIAGNOSIS — M25551 Pain in right hip: Secondary | ICD-10-CM

## 2018-06-22 NOTE — Therapy (Signed)
[place], [place], [address] Phone: [phone]   Fax:  [phone]  Physical Therapy Treatment  Patient Details  Name: Brittany [name] MRN: [id number] Date of Birth: November 29, 1965 Referring Provider (PT): Clearance Coots, MD   Encounter Date: 06/22/2018  PT End of Session - 06/22/18 1616    Visit Number  7    Number of Visits  12    Date for PT Re-Evaluation  07/06/18    Authorization Type  Aetna    PT Start Time  1618    PT Stop Time  1658    PT Time Calculation (min)  40 min    Activity Tolerance  Patient tolerated treatment well    Behavior During Therapy  [facility] : [facility] for tasks assessed/performed       Past Medical History:  Diagnosis Date  . Allergy   . Anxiety   . Arthritis    hips, knees and hands   . Blood transfusion without reported diagnosis   . Depression   . GERD (gastroesophageal reflux disease)    hx of   . H/O hiatal hernia   . History of chicken pox   . Hypercholesterolemia   . Hypertension   . Migraine   . Migraines   . Morbid obesity ([place])   . Positive TB test   . PTSD (post-traumatic stress disorder)   . Sleep apnea    no CPAP machine   . Ulcer, stomach peptic     Past Surgical History:  Procedure Laterality Date  . ABDOMINAL HYSTERECTOMY  1997 & 2006  . BREATH TEK H PYLORI  05/03/2012   Procedure: BREATH TEK H PYLORI;  Surgeon: [name], MD;  Location: [name];  Service: General;  Laterality: N/A;  . COLONOSCOPY     hx of benign polyps   . ESOPHAGOGASTRODUODENOSCOPY N/A 09/29/2017   Procedure: ESOPHAGOGASTRODUODENOSCOPY (EGD);  Surgeon: Ladene Artist, MD;  Location: [name];  Service: Endoscopy;  Laterality: N/A;  . FOOT SURGERY    . GASTRIC BYPASS    . GASTRIC ROUX-EN-Y N/A 07/05/2012   Procedure: LAPAROSCOPIC ROUX-EN-Y GASTRIC;  Surgeon: [name], MD;  Location: [facility];  Service: General;  Laterality: N/A;  . IRRIGATION AND DEBRIDEMENT ABSCESS N/A  01/01/2014   Procedure: IRRIGATION AND DEBRIDEMENT ABSCESS;  Surgeon: [name], MD;  Location: [facility];  Service: General;  Laterality: N/A;  . LAPAROSCOPIC LYSIS OF ADHESIONS N/A 07/05/2012   Procedure: LAPAROSCOPIC LYSIS OF ADHESIONS;  Surgeon: [name], MD;  Location: [facility];  Service: General;  Laterality: N/A;  repair of abdominal wall hernia  . right tube and ovary removed     . UPPER GI ENDOSCOPY N/A 07/05/2012   Procedure: UPPER GI ENDOSCOPY;  Surgeon: [name], MD;  Location: [facility];  Service: General;  Laterality: N/A;    There were no vitals filed for this visit.  Subjective Assessment - 06/22/18 1618    Subjective  Able to take some short walks over the weekend and earlier this week. Pain 2-3/10 this PM right posterolateral hip.    Currently in Pain?  Yes    Pain Score  3     Pain Location  Hip    Pain Orientation  Right;Posterior;Lateral    Pain Descriptors / Indicators  Dull    Pain Type  Acute pain    Pain Onset  More than a month ago    Pain Frequency  Constant    Aggravating  Factors   prolonged walking, standing, activity such as yardwork         [facility] PT Assessment - 06/22/18 0001      Strength   Overall Strength Comments  Right hip flex 5/5, abd and ext 4/5, sore with abduction                   [facility] Adult PT Treatment/Exercise - 06/22/18 0001      Knee/Hip Exercises: Stretches   ITB Stretch  Right;3 reps;30 seconds    Piriformis Stretch  Right;3 reps;30 seconds      Knee/Hip Exercises: Standing   Hip Abduction  Stengthening;Both;10 reps    Abduction Limitations  red Theraband    Other Standing Knee Exercises  hip hike with LLE off 4" step x10 reps      Knee/Hip Exercises: Supine   Bridges  Both;2 sets;10 reps      Manual Therapy   Joint Mobilization  long axis distraction right hip grade I-IV, prone ER mobilization in prone with rigt leg in figure 4 position    Soft tissue mobilization  STM right posterolateral hip              PT Education - 06/22/18 1707    Education Details  HEP, exercises, symptom etiology, POC    Person(s) Educated  Patient    Methods  Explanation;Demonstration;Verbal cues;Handout    Comprehension  Returned demonstration;Verbalized understanding          PT Long Term Goals - 06/22/18 1710      PT LONG TERM GOAL #1   Title  Independent with HEP    Baseline  updated today    Time  6    Period  Weeks    Status  Achieved      PT LONG TERM GOAL #2   Title  Improve FOTO score to 34% or less impairment    Baseline  45% limited at eval, not retested today    Time  6    Period  Weeks    Status  On-going      PT LONG TERM GOAL #3   Title  Right hip strength grossly 5/5 to improve ability for stair navigation at home and sit>stand from low chairs    Baseline  see flowsheet    Time  6    Period  Weeks    Status  On-going      PT LONG TERM GOAL #4   Title  Tolerate standing, ambulation for activities such as grocery shopping and IADLs periods at least 30-40 min with hip pain 2/10 or less    Baseline  Improving from baseline but pain >2/10    Time  6    Period  Weeks    Status  On-going      PT LONG TERM GOAL #5   Title  Resume walking exercise program    Baseline  able to tolerate short distances now    Time  6    Period  Weeks    Status  On-going            Plan - 06/22/18 1707    Clinical Impression Statement  Primary symptoms at this point are right lateral/posterolateral hip pain and soreness and weakness in abduction consistent with hip abductor tendinopathy. Updated HEP with progressive exercises to address. Pt. continues to make improvements from previous status with functional status for walking tolerance.    Stability/Clinical Decision Making  Stable/Uncomplicated    Clinical  Decision Making  Low    Rehab Potential  Fair    PT Frequency  2x / week    PT Duration  6 weeks    PT Treatment/Interventions  ADLs/Self Care Home  Management;Cryotherapy;Moist Heat;Ultrasound;Electrical Stimulation;Functional mobility training;Neuromuscular re-education;Therapeutic exercise;Therapeutic activities;Patient/family education;Manual techniques;Dry needling;Taping    PT Next Visit Plan  possible d/c to HEP next session pending status, update HEP as needed, hip joint mobs and STM    PT Home Exercise Plan  clamshells, glut set with progression to hip bridge, quad sets, SAQ, hip add. isometric, piriformis and lateral hip stretches, hip abduction with band, hip hike    Consulted and Agree with Plan of Care  Patient       Patient will benefit from skilled therapeutic intervention in order to improve the following deficits and impairments:  Pain, Obesity, Difficulty walking, Decreased activity tolerance, Decreased endurance, Decreased range of motion, Decreased strength  Visit Diagnosis: Pain in right thigh  Pain in right hip  Difficulty in walking, not elsewhere classified     Problem List Patient Active Problem List   Diagnosis Date Noted  . Right thigh pain 05/19/2018  . Melena   . Acute gastrojejunal anastomotic ulcer   . Macrocytic anemia   . Acute GI bleeding 09/28/2017  . Epigastric abdominal pain 11/19/2016  . Incisional hernia with obstruction but no gangrene 07/21/2016  . Menopause 04/22/2016  . Routine general medical examination at a health care facility 03/18/2016  . Obesity 03/18/2016  . Severe episode of recurrent major depressive disorder, without psychotic features ([place])   . Depression, major, severe recurrence ([place]) 09/11/2015  . Major depressive disorder, recurrent, severe without psychotic features ([place])   . MDD (major depressive disorder) 10/30/2014  . MDD (major depressive disorder), recurrent severe, without psychosis ([place])   . Suicidal ideation 10/05/2014  . Depression 06/12/2014  . Rash 01/04/2014  . Normocytic anemia 01/04/2014  . DJD (degenerative joint disease) 01/04/2014  .  Hypertension 01/04/2014  . Dyslipidemia 01/04/2014  . GERD (gastroesophageal reflux disease) 01/04/2014  . Obstructive sleep apnea 01/04/2014  . Status post small bowel resection 01/04/2014  . Abdominal abscess 01/01/2014  . Wound infection after surgery 01/01/2014  . PTSD (post-traumatic stress disorder) 11/23/2013  . Severe recurrent major depression without psychotic features ([place]) 11/22/2013  . 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 Morbid obesity, Weight - 312, BMI - 45.2 04/21/2012    Beaulah Dinning, PT, DPT 06/22/18 5:11 PM  [facility] [address] [place], [place], [address] Phone: [phone]   Fax:  [phone]  Name: RAYSA BOSAK MRN: [id number] Date of Birth: December 12, 1965

## 2018-06-24 ENCOUNTER — Other Ambulatory Visit: Payer: Self-pay

## 2018-06-24 ENCOUNTER — Encounter: Payer: Self-pay | Admitting: Physical Therapy

## 2018-06-24 ENCOUNTER — Ambulatory Visit: Payer: 59 | Admitting: Physical Therapy

## 2018-06-24 ENCOUNTER — Other Ambulatory Visit: Payer: Self-pay | Admitting: Nurse Practitioner

## 2018-06-24 DIAGNOSIS — M79651 Pain in right thigh: Secondary | ICD-10-CM

## 2018-06-24 DIAGNOSIS — M25551 Pain in right hip: Secondary | ICD-10-CM

## 2018-06-24 DIAGNOSIS — R262 Difficulty in walking, not elsewhere classified: Secondary | ICD-10-CM

## 2018-06-24 NOTE — Therapy (Signed)
[place], [place], [address] Phone: [phone]   Fax:  [phone]  Physical Therapy Treatment  Patient Details  Name: Brittany [name] MRN: [id number] Date of Birth: 07-Aug-1965 Referring Provider (PT): Clearance Coots, MD   Encounter Date: 06/24/2018  PT End of Session - 06/24/18 2049    Visit Number  8    Number of Visits  12    Date for PT Re-Evaluation  07/06/18    Authorization Type  Aetna    PT Start Time  1625    PT Stop Time  1704    PT Time Calculation (min)  39 min    Activity Tolerance  Patient tolerated treatment well    Behavior During Therapy  [facility] for tasks assessed/performed       Past Medical History:  Diagnosis Date  . Allergy   . Anxiety   . Arthritis    hips, knees and hands   . Blood transfusion without reported diagnosis   . Depression   . GERD (gastroesophageal reflux disease)    hx of   . H/O hiatal hernia   . History of chicken pox   . Hypercholesterolemia   . Hypertension   . Migraine   . Migraines   . Morbid obesity ([place])   . Positive TB test   . PTSD (post-traumatic stress disorder)   . Sleep apnea    no CPAP machine   . Ulcer, stomach peptic     Past Surgical History:  Procedure Laterality Date  . ABDOMINAL HYSTERECTOMY  1997 & 2006  . BREATH TEK H PYLORI  05/03/2012   Procedure: BREATH TEK H PYLORI;  Surgeon: [name], MD;  Location: [name];  Service: General;  Laterality: N/A;  . COLONOSCOPY     hx of benign polyps   . ESOPHAGOGASTRODUODENOSCOPY N/A 09/29/2017   Procedure: ESOPHAGOGASTRODUODENOSCOPY (EGD);  Surgeon: Ladene Artist, MD;  Location: [name];  Service: Endoscopy;  Laterality: N/A;  . FOOT SURGERY    . GASTRIC BYPASS    . GASTRIC ROUX-EN-Y N/A 07/05/2012   Procedure: LAPAROSCOPIC ROUX-EN-Y GASTRIC;  Surgeon: [name], MD;  Location[facility];  Service: General;  Laterality: N/A;  . IRRIGATION AND DEBRIDEMENT ABSCESS N/A  01/01/2014   Procedure: IRRIGATION AND DEBRIDEMENT ABSCESS;  Surgeon: [name], MD;  Location: [facility];  Service: General;  Laterality: N/A;  . LAPAROSCOPIC LYSIS OF ADHESIONS N/A 07/05/2012   Procedure: LAPAROSCOPIC LYSIS OF ADHESIONS;  Surgeon: [name], MD;  Location: [facility];  Service: General;  Laterality: N/A;  repair of abdominal wall hernia  . right tube and ovary removed     . UPPER GI ENDOSCOPY N/A 07/05/2012   Procedure: UPPER GI ENDOSCOPY;  Surgeon: [name], MD;  Location: [facility];  Service: General;  Laterality: N/A;    There were no vitals filed for this visit.  Subjective Assessment - 06/24/18 1702    Subjective  Pt. presents for 8th PT session today and rates improvement at 60%-70% from baseline. Status discussed and pt. feels ready for d/c to HEP at this point-she will continue exercises and follow up with MD as needed with any changes in status.    Pertinent History  depression, anxiety, obesity    Limitations  Standing;Walking;House hold activities    Diagnostic tests  X-rays, doppler US    Patient Stated Goals  Return to walking exercise (had been walking 1-2 miles daily prior to injury and  is currently unable)         [facility] PT Assessment - 06/24/18 0001      Observation/Other Assessments   Focus on Therapeutic Outcomes (FOTO)   50%      AROM   Right Hip Flexion  105    Right Hip External Rotation   50    Right Hip Internal Rotation   20    Right Hip ABduction  55      Strength   Overall Strength Comments  Right hip flex 5/5, abd 4+/5, ext 4+/5, ER 4+/5, IR 5/5, right knee flex and ext 5/5                   [facility] Adult PT Treatment/Exercise - 06/24/18 0001      Knee/Hip Exercises: Stretches   ITB Stretch  Right;3 reps;30 seconds    Piriformis Stretch  Right;3 reps;30 seconds      Knee/Hip Exercises: Supine   Bridges with Clamshell  Both;20 reps   green band   Other Supine Knee/Hip Exercises  clamshell green 2x10       Manual Therapy   Joint Mobilization  long axis distraction right hip grade I-IV, prone mobilization for hip ER with right leg in figure 4 position grade I-IV    Soft tissue mobilization  STM right posterolateral hip region             PT Education - 06/24/18 2049    Education Details  HEP, POC    Person(s) Educated  Patient    Methods  Explanation    Comprehension  Verbalized understanding          PT Long Term Goals - 06/24/18 1700      PT LONG TERM GOAL #1   Title  Independent with HEP    Baseline  met    Time  6    Period  Weeks    Status  Achieved      PT LONG TERM GOAL #2   Title  Improve FOTO score to 34% or less impairment    Baseline  50%    Time  6    Period  Weeks    Status  Not Met      PT LONG TERM GOAL #3   Title  Right hip strength grossly 5/5 to improve ability for stair navigation at home and sit>stand from low chairs    Baseline  4+/5 to 5/5    Time  6    Period  Weeks    Status  Partially Met      PT LONG TERM GOAL #4   Title  Tolerate standing, ambulation for activities such as grocery shopping and IADLs periods at least 30-40 min with hip pain 2/10 or less    Baseline  able with pain 2-3/10    Time  6    Period  Weeks      PT LONG TERM GOAL #5   Title  Resume walking exercise program    Baseline  met though distance limited    Time  6    Period  Weeks    Status  Achieved            Plan - 06/24/18 2050    Clinical Impression Statement  Pt. has overall progressed well with therapy with decreased right hip/thigh pain from baseline and improvements in functional status with walking tolerance and abilities for activities such as stair navigation. Residual symptoms at this point consistent with  abductor tendinopathy/bursitis-expect pt. can continue progress via HEP with plan for pt. to follow up with Dr. Raeford Razor with any changes in status/if not improving as expected.    Stability/Clinical Decision Making  Stable/Uncomplicated     Rehab Potential  Good    Clinical Impairments Affecting Rehab Potential  Time since onset, potential for underlying intrinsic hip pathology    PT Frequency  2x / week    PT Duration  6 weeks    PT Treatment/Interventions  ADLs/Self Care Home Management;Cryotherapy;Moist Heat;Ultrasound;Electrical Stimulation;Functional mobility training;Neuromuscular re-education;Therapeutic exercise;Therapeutic activities;Patient/family education;Manual techniques;Dry needling;Taping    PT Next Visit Plan  NA    PT Home Exercise Plan  clamshells, hip bridge, SAQ, hip add. isometric, piriformis and lateral hip stretches, hip abduction with band, hip hike    Consulted and Agree with Plan of Care  Patient       Patient will benefit from skilled therapeutic intervention in order to improve the following deficits and impairments:  Pain, Obesity, Difficulty walking, Decreased activity tolerance, Decreased endurance, Decreased range of motion, Decreased strength  Visit Diagnosis: Pain in right thigh  Pain in right hip  Difficulty in walking, not elsewhere classified     Problem List Patient Active Problem List   Diagnosis Date Noted  . Right thigh pain 05/19/2018  . Melena   . Acute gastrojejunal anastomotic ulcer   . Macrocytic anemia   . Acute GI bleeding 09/28/2017  . Epigastric abdominal pain 11/19/2016  . Incisional hernia with obstruction but no gangrene 07/21/2016  . Menopause 04/22/2016  . Routine general medical examination at a health care facility 03/18/2016  . Obesity 03/18/2016  . Severe episode of recurrent major depressive disorder, without psychotic features ([place])   . Depression, major, severe recurrence ([place]) 09/11/2015  . Major depressive disorder, recurrent, severe without psychotic features ([place])   . MDD (major depressive disorder) 10/30/2014  . MDD (major depressive disorder), recurrent severe, without psychosis ([place])   . Suicidal ideation 10/05/2014  . Depression 06/12/2014   . Rash 01/04/2014  . Normocytic anemia 01/04/2014  . DJD (degenerative joint disease) 01/04/2014  . Hypertension 01/04/2014  . Dyslipidemia 01/04/2014  . GERD (gastroesophageal reflux disease) 01/04/2014  . Obstructive sleep apnea 01/04/2014  . Status post small bowel resection 01/04/2014  . Abdominal abscess 01/01/2014  . Wound infection after surgery 01/01/2014  . PTSD (post-traumatic stress disorder) 11/23/2013  . Severe recurrent major depression without psychotic features ([place]) 11/22/2013  . 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 Morbid obesity, Weight - 312, BMI - 45.2 04/21/2012      PHYSICAL THERAPY DISCHARGE SUMMARY  Visits from Start of Care: 8  Current functional level related to goals / functional outcomes: See above. Able to resume walking exercise but still limited with prolonged distances. Improving tolerance stair navigation and yardwork.   Remaining deficits: Right hip weakness and mild stiffness.   Education / Equipment: HEP, have issued green Theraband for HEP Plan: Patient agrees to discharge.  Patient goals were partially met. Patient is being discharged due to being pleased with the current functional level.  ?????          Beaulah Dinning, PT, DPT 06/24/18 8:57 PM    [facility] [address] [place], [place], [address] Phone: [phone]   Fax:  [phone]  Name: Brittany [name] MRN: [id number] Date of Birth: 06/02/1965

## 2018-06-28 ENCOUNTER — Ambulatory Visit (HOSPITAL_COMMUNITY): Payer: 59 | Admitting: Psychology

## 2018-07-08 ENCOUNTER — Ambulatory Visit (INDEPENDENT_AMBULATORY_CARE_PROVIDER_SITE_OTHER): Payer: 59 | Admitting: Psychology

## 2018-07-08 ENCOUNTER — Other Ambulatory Visit: Payer: Self-pay

## 2018-07-08 DIAGNOSIS — F3341 Major depressive disorder, recurrent, in partial remission: Secondary | ICD-10-CM

## 2018-07-08 NOTE — Progress Notes (Signed)
Virtual Visit via Video Note  I connected with [name] on 07/08/18 at 12:30 PM EDT by a video enabled telemedicine application and verified that I am speaking with the correct person using two identifiers.   I discussed the limitations of evaluation and management by telemedicine and the availability of in person appointments. The patient expressed understanding and agreed to proceed.     I provided 35 minutes of non-face-to-face time during this encounter.    THERAPIST PROGRESS NOTE  Session Time: 12.30pm-1.05pm  Participation Level: Active  Behavioral Response: Well GroomedAlertaffect wnl  Type of Therapy: Individual Therapy  Treatment Goals addressed: Diagnosis: MDD and goal 1.  Interventions: CBT and Supportive  Summary: Brittany [name] is a 53 y.o. female who presents with affect wnl.  Pt reported that she is doing well w/ mood.  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  Pt reported she had helped mom out last week as recovered from an illness- this interrupted plans for boyfriend to meet family.  Pt reported that she was able to express to boyfriend that loves him and gave a lot of thought and had been nervous to express.  Pt reports feels good about doing and was reciprocated.  Pt reported that she has struggled at times w/ not beating self up w/ younger daughter decision to distance self and to not understand why.  Pt discussed that focus on acceptance and dealing w/ current loss.  Pt discussed ways keeping connected and ability to enjoy things at home..   Suicidal/Homicidal: Nowithout intent/plan  Therapist Response: Assessed pt current functioning per pt report.  Explored w/pt transition w/ covid 19 and social distancing.  Discussed w/pt her thoughts re: daughter and ways of acceptance and not self blaming.    Plan: Return again in 3 weeks. F/u w/ continued teletherapy through webex. The patient was  advised to call back or seek an in-person evaluation if the symptoms worsen or if the condition fails to improve as anticipated.   Diagnosis: MDD   [name], [employer] 07/08/2018

## 2018-07-24 ENCOUNTER — Other Ambulatory Visit: Payer: Self-pay | Admitting: Nurse Practitioner

## 2018-08-03 ENCOUNTER — Other Ambulatory Visit: Payer: Self-pay

## 2018-08-03 ENCOUNTER — Ambulatory Visit (INDEPENDENT_AMBULATORY_CARE_PROVIDER_SITE_OTHER): Payer: 59 | Admitting: Psychology

## 2018-08-03 DIAGNOSIS — F4310 Post-traumatic stress disorder, unspecified: Secondary | ICD-10-CM | POA: Diagnosis not present

## 2018-08-03 DIAGNOSIS — F3341 Major depressive disorder, recurrent, in partial remission: Secondary | ICD-10-CM | POA: Diagnosis not present

## 2018-08-03 NOTE — Progress Notes (Signed)
Virtual Visit via Video Note  I connected with Brittany [name] on 08/03/18 at  1:30 PM EDT by a video enabled telemedicine application and verified that I am speaking with the correct person using two identifiers.   I discussed the limitations of evaluation and management by telemedicine and the availability of in person appointments. The patient expressed understanding and agreed to proceed.    I provided 38 minutes of non-face-to-face time during this encounter.   [name] [facility]    THERAPIST PROGRESS NOTE  Session Time: 1.34pm-2.12pm  Participation Level: Active  Behavioral Response: Well GroomedAlertaffect wnl  Type of Therapy: Individual Therapy  Treatment Goals addressed: Diagnosis: MDD and goal 1.  Interventions: CBT and Strength-based  Summary: Brittany [name] is a 53 y.o. female who presents with affect wnl.  pt reported that she has been dealing w/ increased anxiety over the past 2 weeks. Pt reported that initially she had been doing well w/ working from home- however w/ living alone has been struggling w/ worries of current situaiton w/ covid 19 and impact.  Pt reported that she began to be concerned she might be furloughed, but that more work was able to come in to keep busy for the year.  Pt is feeling thankful for that.  Pt recognized that would be helpful to change up and visited w/ mom and worked from there for the week.  Pt reported this was beneficial.  Pt also discussed how she is keeping in contact w/ friends and reaching out when needed.  Pt is still dating boyfriend and they visit w/ each other occasionally.  Pt discussed ways of healthy distracitons, reframing and utilizing coping skills.  Pt reports not journaling and will return to use as has helped in past.  Suicidal/Homicidal: Nowithout intent/plan  Therapist Response: Assessed pt current functioning per pt report. Processed w/pt coping w/ increased anxiety- reflected and validating and discussed positive  steps pt is taking.  Reiterated coping skills- reframing and returning w/ journaling to assist.  Discussed grounding as needed and reaching out to her supports.  Plan: Return again in 2 weeks, via webex. I discussed the assessment and treatment plan with the patient. The patient was provided an opportunity to ask questions and all were answered. The patient agreed with the plan and demonstrated an understanding of the instructions.   The patient was advised to call back or seek an in-person evaluation if the symptoms worsen or if the condition fails to improve as anticipated.  Diagnosis: MDD, anxiety   [name], [facility] 08/03/2018

## 2018-08-19 ENCOUNTER — Ambulatory Visit (INDEPENDENT_AMBULATORY_CARE_PROVIDER_SITE_OTHER): Payer: 59 | Admitting: Psychology

## 2018-08-19 ENCOUNTER — Other Ambulatory Visit: Payer: Self-pay

## 2018-08-19 DIAGNOSIS — F330 Major depressive disorder, recurrent, mild: Secondary | ICD-10-CM | POA: Diagnosis not present

## 2018-08-19 NOTE — Progress Notes (Signed)
Virtual Visit via Video Note  I connected with Brittany [name] on 08/19/18 at  1:30 PM EDT by a video enabled telemedicine application and verified that I am speaking with the correct person using two identifiers.   I discussed the limitations of evaluation and management by telemedicine and the availability of in person appointments. The patient expressed understanding and agreed to proceed.  I provided 33 minutes of non-face-to-face time during this encounter.   Brittany [name] [facility]   THERAPIST PROGRESS NOTE  Session Time: 1.37pm-4.10pm  Participation Level: Active  Behavioral Response: Well GroomedAlertaffect anxious  Type of Therapy: Individual Therapy  Treatment Goals addressed: Diagnosis: MDD and goal 1.  Interventions: CBT  Summary: Brittany [name] is a 53 y.o. female who presents with affect congruent w/ report of anxious recent.  Pt reported not having fears and worries about Covid.  But is missing the ability to get out like used to and feeling more restless and anxious.  Pt reported she never expected to want to return to the office but would like that and became aware yesterday that not clear plan from company but may still be a few months.  Pt reported that she was able to get out and go [place] to get things done w/ him and that was helpful.  Pt reports also finds helpful when gets into real clothes for the day.  Pt identified that she could go out for walks or spend time soothing outside and discusses how to add those in. .   Suicidal/Homicidal: Nowithout intent/plan  Therapist Response: Assessed pt current functioning per pt report.  Processed w/ pt increased anxiety w/ being at home and not able to get out for day to day and work.  Validated and normalized pt experience and discussed ways of coping to assist w/ reducing restless and anxious feelings.  Reflected pt positive steps taking and ways to add on.   Plan: Return again in 2 weeks, via webex. I  discussed the assessment and treatment plan with the patient. The patient was provided an opportunity to ask questions and all were answered. The patient agreed with the plan and demonstrated an understanding of the instructions.   The patient was advised to call back or seek an in-person evaluation if the symptoms worsen or if the condition fails to improve as anticipated.  Diagnosis: MDD; anxiety  Brittany [name], [facility] 08/19/2018

## 2018-08-23 ENCOUNTER — Other Ambulatory Visit: Payer: Self-pay

## 2018-08-23 ENCOUNTER — Encounter (HOSPITAL_COMMUNITY): Payer: Self-pay | Admitting: Psychiatry

## 2018-08-23 ENCOUNTER — Ambulatory Visit (INDEPENDENT_AMBULATORY_CARE_PROVIDER_SITE_OTHER): Payer: 59 | Admitting: Psychiatry

## 2018-08-23 ENCOUNTER — Other Ambulatory Visit: Payer: Self-pay | Admitting: Gastroenterology

## 2018-08-23 DIAGNOSIS — F410 Panic disorder [episodic paroxysmal anxiety] without agoraphobia: Secondary | ICD-10-CM

## 2018-08-23 DIAGNOSIS — F4310 Post-traumatic stress disorder, unspecified: Secondary | ICD-10-CM

## 2018-08-23 DIAGNOSIS — F3341 Major depressive disorder, recurrent, in partial remission: Secondary | ICD-10-CM

## 2018-08-23 MED ORDER — DULOXETINE HCL 60 MG PO CPEP: 60.0000 mg | ORAL_CAPSULE | Freq: Two times a day (BID) | ORAL | 0 refills | Status: DC

## 2018-08-23 MED ORDER — QUETIAPINE FUMARATE 50 MG PO TABS: ORAL_TABLET | ORAL | 2 refills | Status: DC

## 2018-08-23 MED ORDER — LORAZEPAM 0.5 MG PO TABS: 0.5000 mg | ORAL_TABLET | Freq: Every day | ORAL | 0 refills | Status: DC | PRN
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
## 2018-08-31 ENCOUNTER — Other Ambulatory Visit: Payer: Self-pay

## 2018-08-31 ENCOUNTER — Ambulatory Visit (INDEPENDENT_AMBULATORY_CARE_PROVIDER_SITE_OTHER): Payer: 59 | Admitting: Psychology

## 2018-08-31 DIAGNOSIS — F330 Major depressive disorder, recurrent, mild: Secondary | ICD-10-CM | POA: Diagnosis not present

## 2018-08-31 NOTE — Progress Notes (Signed)
Virtual Visit via Video Note  I connected with Brittany [name] on 08/31/18 at 12:30 PM EDT by a video enabled telemedicine application and verified that I am speaking with the correct person using two identifiers.   I discussed the limitations of evaluation and management by telemedicine and the availability of in person appointments. The patient expressed understanding and agreed to proceed.  I provided 33 minutes of non-face-to-face time during this encounter.   [name] [facility]    THERAPIST PROGRESS NOTE  Session Time: 12.30pm-1.03pm  Participation Level: Active  Behavioral Response: Well GroomedAlertaffect slightly down.  Type of Therapy: Individual Therapy  Treatment Goals addressed: Diagnosis: MDD and goal 1.  Interventions: CBT and Supportive  Summary: Brittany [name] is a 53 y.o. female who presents with affect slightly down.  Pt reported that she has been doing "ok".  Pt reported that she still has anxiety that has been increased- but is staying more active w/ doing things around the house and this is helping to provide a positive distraction.  Pt identifies anxiety as restless w/ being more isolated working from home.  Pt reports she continues to get together w/ her boyfriend couple times a week and enjoys this and continues to stay engaged w/ her family through conversations. Pt is hoping to visit mom again in next couple of weeks- planning hasn't work out for at this point.  Pt reported that she did introduce her boyfriend to her daughter and her family. Pt reported that she was very anxious to do and worked up w/anxiety that day that caused stomach issues- but that things went well and as usually nothing to worry about.  Pt was very aware of anxiety and thoughts- was able to reframe some but just kept thinking the what ifs.  Pt expressed that she feels good that could do and good insight that w/ so much loss she didn't want to loss anything more..   Suicidal/Homicidal:  Nowithout intent/plan  Therapist Response: Assessed pt current functioning per pt report. Processed w/pt coping w/ anxiety and reflected pt positive use of coping.  disuse good insight into anxiety and worry about introducing boyfriend to daughter and good that didn't avoid and able to practice distress tolerance.  Plan: Return again in 2 weeks, via webex.I discussed the assessment and treatment plan with the patient. The patient was provided an opportunity to ask questions and all were answered. The patient agreed with the plan and demonstrated an understanding of the instructions.   The patient was advised to call back or seek an in-person evaluation if the symptoms worsen or if the condition fails to improve as anticipated.  Diagnosis: MDD and anxiety   [name], [facility] 08/31/2018

## 2018-09-14 ENCOUNTER — Ambulatory Visit (INDEPENDENT_AMBULATORY_CARE_PROVIDER_SITE_OTHER): Payer: 59 | Admitting: Psychology

## 2018-09-14 ENCOUNTER — Other Ambulatory Visit: Payer: Self-pay

## 2018-09-14 DIAGNOSIS — F330 Major depressive disorder, recurrent, mild: Secondary | ICD-10-CM | POA: Diagnosis not present

## 2018-09-14 NOTE — Progress Notes (Signed)
Virtual Visit via Video Note  I connected with Brittany [name] on 09/14/18 at 12:30 PM EDT by a video enabled telemedicine application and verified that I am speaking with the correct person using two identifiers.   I discussed the limitations of evaluation and management by telemedicine and the availability of in person appointments. The patient expressed understanding and agreed to proceed.  I provided 30 minutes of non-face-to-face time during this encounter.   [name] [facility]    THERAPIST PROGRESS NOTE  Session Time: 12.30pm-1pm  Participation Level: Active  Behavioral Response: Well GroomedAlertaffect slightly depressed  Type of Therapy: Individual Therapy  Treatment Goals addressed: Diagnosis: MDd and goal 1.  Interventions: CBT and Supportive  Summary: Brittany [name] is a 53 y.o. female who presents with affect slightly depressed.  Pt reported awareness of some depressed mood and effect of social distancing w/ pandemic.  Pt reported that she is using her skills and this is helping.  Pt reported good sense of accomplishment taking her old shed down and disposing of.  Pt reports that this kept her busy and out of house getting down.  Pt reported that she will be going to her mom's this weekend and really looking forward to time away and getting time to have company.  Pt will work from her mom's during this time. Pt reports she is also nervous as her boyfriend is coming for just the weekend to meet the family.  Pt good insight into this and recognizing as norm- using coping skills to not further escalate this anxiety.  Suicidal/Homicidal: Nowithout intent/plan  Therapist Response: Assessed pt current functioning per pt report. Processed w/pt coping through depressed mood and assisting pt is seeing strengths of coping skills using and maintaining w/ supports.  Explored w/pt her focus over next couple of weeks- things to look forward to, normalizing anxiety and ways to cope through.    Plan: Return again in 3 weeks, via webex. I discussed the assessment and treatment plan with the patient. The patient was provided an opportunity to ask questions and all were answered. The patient agreed with the plan and demonstrated an understanding of the instructions.   The patient was advised to call back or seek an in-person evaluation if the symptoms worsen or if the condition fails to improve as anticipated. Diagnosis: MDD   [name] [facility] 09/14/2018

## 2018-09-22 ENCOUNTER — Other Ambulatory Visit: Payer: Self-pay | Admitting: Gastroenterology

## 2018-10-05 ENCOUNTER — Other Ambulatory Visit: Payer: Self-pay

## 2018-10-05 ENCOUNTER — Ambulatory Visit (INDEPENDENT_AMBULATORY_CARE_PROVIDER_SITE_OTHER): Payer: 59 | Admitting: Psychology

## 2018-10-05 DIAGNOSIS — F330 Major depressive disorder, recurrent, mild: Secondary | ICD-10-CM | POA: Diagnosis not present

## 2018-10-05 NOTE — Progress Notes (Signed)
Virtual Visit via Telephone Note  I connected with Brittany [name] on 10/05/18 at 12:30 PM EDT by telephone, as video not available, and verified that I am speaking with the correct person using two identifiers.   I discussed the limitations, risks, security and privacy concerns of performing an evaluation and management service by telephone and the availability of in person appointments. I also discussed with the patient that there may be a patient responsible charge related to this service. The patient expressed understanding and agreed to proceed.   History of Present Illness:    Observations/Objective:   Assessment and Plan:   Follow Up Instructions:    I discussed the assessment and treatment plan with the patient. The patient was provided an opportunity to ask questions and all were answered. The patient agreed with the plan and demonstrated an understanding of the instructions.   The patient was advised to call back or seek an in-person evaluation if the symptoms worsen or if the condition fails to improve as anticipated.  I provided 34 minutes of non-face-to-face time during this encounter.   [name] [facility]    THERAPIST PROGRESS NOTE  Session Time: 12.31pm-1.05pm  Participation Level: Active  Behavioral Response: on phoneAlertaffect bright  Type of Therapy: Individual Therapy  Treatment Goals addressed: Diagnosis: MDD and goal 1.  Interventions: CBT and Strength-based  Summary: Brittany [name] is a 53 y.o. female who presents with affect bright.  Pt reported she is at the beach w/ her boyfriend for a couple of days.  Pt reported that past couple of weeks have been good.  She visited in [place] w/ her mom and boyfriend met her family and friends and went really well.  Pt reported she had anxiety about and acknowledged likely working up more in head but that kept encouraging herself to move through and went well. Pt reported that had a lift of the anger she has been  feeling towards her ex at this time and that has felt good. Pt also reported another step in relationship this week was sleeping in the same bed and were able to talk about the hesitations and feelings about.  Pt also reported that w/ work- now talking that earliest return to in office would be November and so she decided to set up a home office in spare room and so glad she did- has given the work and home separation she has needed.  Pt feels that her thoughts are more clear and ease of ruminating worries recent.   Suicidal/Homicidal: Nowithout intent/plan  Therapist Response: Assessed pt current functioning per pt report.  Processed w/ pt coping through anxieties and using her skills and reframing- working through and not avoiding due to anxiety.  Validated pt feelings and reflected pt strengths.   Plan: Return again in 3 weeks, via webex.  Diagnosis: MDD   [name] [facility] 10/05/2018

## 2018-10-20 ENCOUNTER — Other Ambulatory Visit (INDEPENDENT_AMBULATORY_CARE_PROVIDER_SITE_OTHER): Payer: 59

## 2018-10-20 ENCOUNTER — Encounter: Payer: Self-pay | Admitting: Family

## 2018-10-20 ENCOUNTER — Ambulatory Visit (INDEPENDENT_AMBULATORY_CARE_PROVIDER_SITE_OTHER): Payer: 59 | Admitting: Family

## 2018-10-20 ENCOUNTER — Other Ambulatory Visit: Payer: Self-pay

## 2018-10-20 VITALS — BP 126/76 | HR 72 | Temp 98.2°F | Ht 70.0 in | Wt 274.8 lb

## 2018-10-20 DIAGNOSIS — L509 Urticaria, unspecified: Secondary | ICD-10-CM

## 2018-10-20 LAB — CBC WITH DIFFERENTIAL/PLATELET
Basophils Absolute: 0 10*3/uL (ref 0.0–0.1)
Basophils Relative: 0.3 % (ref 0.0–3.0)
Eosinophils Absolute: 0 10*3/uL (ref 0.0–0.7)
Eosinophils Relative: 0 % (ref 0.0–5.0)
HCT: 36.9 % (ref 36.0–46.0)
Hemoglobin: 12.4 g/dL (ref 12.0–15.0)
Lymphocytes Relative: 8.8 % — ABNORMAL LOW (ref 12.0–46.0)
Lymphs Abs: 0.7 10*3/uL (ref 0.7–4.0)
MCHC: 33.7 g/dL (ref 30.0–36.0)
MCV: 101.2 fl — ABNORMAL HIGH (ref 78.0–100.0)
Monocytes Absolute: 0.2 10*3/uL (ref 0.1–1.0)
Monocytes Relative: 1.9 % — ABNORMAL LOW (ref 3.0–12.0)
Neutro Abs: 7.5 10*3/uL (ref 1.4–7.7)
Neutrophils Relative %: 89 % — ABNORMAL HIGH (ref 43.0–77.0)
Platelets: 362 10*3/uL (ref 150.0–400.0)
RBC: 3.65 Mil/uL — ABNORMAL LOW (ref 3.87–5.11)
RDW: 12.9 % (ref 11.5–15.5)
WBC: 8.4 10*3/uL (ref 4.0–10.5)

## 2018-10-20 MED ORDER — FAMOTIDINE 20 MG PO TABS: 20.0000 mg | ORAL_TABLET | Freq: Two times a day (BID) | ORAL | 0 refills | Status: DC

## 2018-10-20 MED ORDER — PREDNISONE 10 MG PO TABS: ORAL_TABLET | ORAL | 0 refills | Status: DC

## 2018-10-20 NOTE — Progress Notes (Signed)
Brittany [name] is a 53 y.o. female with the following history as recorded in EpicCare:  Patient Active Problem List   Diagnosis Date Noted  . Right thigh pain 05/19/2018  . Melena   . Acute gastrojejunal anastomotic ulcer   . Macrocytic anemia   . Acute GI bleeding 09/28/2017  . Epigastric abdominal pain 11/19/2016  . Incisional hernia with obstruction but no gangrene 07/21/2016  . Menopause 04/22/2016  . Routine general medical examination at a health care facility 03/18/2016  . Obesity 03/18/2016  . Severe episode of recurrent major depressive disorder, without psychotic features ([place])   . Depression, major, severe recurrence ([place]) 09/11/2015  . Major depressive disorder, recurrent, severe without psychotic features ([place])   . MDD (major depressive disorder) 10/30/2014  . MDD (major depressive disorder), recurrent severe, without psychosis ([place])   . Suicidal ideation 10/05/2014  . Depression 06/12/2014  . Rash 01/04/2014  . Normocytic anemia 01/04/2014  . DJD (degenerative joint disease) 01/04/2014  . Hypertension 01/04/2014  . Dyslipidemia 01/04/2014  . GERD (gastroesophageal reflux disease) 01/04/2014  . Obstructive sleep apnea 01/04/2014  . Status post small bowel resection 01/04/2014  . Abdominal abscess 01/01/2014  . Wound infection after surgery 01/01/2014  . PTSD (post-traumatic stress disorder) 11/23/2013  . Severe recurrent major depression without psychotic features ([place]) 11/22/2013  . 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 Morbid obesity, Weight - 312, BMI - 45.2 04/21/2012    Current Outpatient Medications  Medication Sig Dispense Refill  . acetaminophen (TYLENOL 8 HOUR ARTHRITIS PAIN) 650 MG CR tablet Take 2,600 mg by mouth once.    . calcium-vitamin D (OSCAL WITH D) 500-200 MG-UNIT tablet Take 3 tablets by mouth every morning. For bone health (Patient taking differently: Take 3 tablets by mouth every evening. For bone health)    .  cholecalciferol (VITAMIN D) 1000 units tablet Take 1 tablet (1,000 Units total) by mouth daily. For bone health    . doxycycline (DORYX) 100 MG EC tablet Take 100 mg by mouth 2 (two) times daily.    . DULoxetine (CYMBALTA) 60 MG capsule Take 1 capsule (60 mg total) by mouth 2 (two) times daily. 180 capsule 0  . famotidine (PEPCID) 40 MG tablet Take 40 mg by mouth daily.    Marland Kitchen loratadine (CLARITIN) 10 MG tablet Take 10 mg by mouth daily.    Marland Kitchen LORazepam (ATIVAN) 0.5 MG tablet Take 1 tablet (0.5 mg total) by mouth daily as needed for anxiety. 20 tablet 0  . Multiple Vitamin (MULTI-VITAMIN) tablet Take by mouth.    . Multiple Vitamins-Minerals (MULTIVITAMIN WITH MINERALS) tablet Take 1 tablet by mouth daily. For low vitamin    . mupirocin ointment (BACTROBAN) 2 % APP EXT AA BID FOR 14 DAYS    . pantoprazole (PROTONIX) 40 MG tablet TAKE 1 TABLET BY MOUTH TWICE DAILY 60 tablet 0  . predniSONE (DELTASONE) 20 MG tablet Take 20 mg by mouth daily with breakfast.    . QUEtiapine (SEROQUEL) 50 MG tablet TAKE 3 TABLETS(150 MG) BY MOUTH AT BEDTIME 90 tablet 2  . vitamin B-12 (CYANOCOBALAMIN) 500 MCG tablet Take 1 tablet (500 mcg total) by mouth daily. For Vitamin B-12 replacement    . vitamin E 1000 UNIT capsule Take 1,000 Units by mouth daily.    . famotidine (PEPCID) 20 MG tablet Take 1 tablet (20 mg total) by mouth 2 (two) times daily. 60 tablet 0  . predniSONE (DELTASONE) 10 MG tablet Take 4 tablets x 2  days; take 3 tablets x 2 days; take 2 tablets x 2 days; take 1 tablet x 2 day; 20 tablet 0   No current facility-administered medications for this visit.     Allergies: Contrast media [iodinated diagnostic agents], Amoxicillin, Bactrim [sulfamethoxazole-trimethoprim], and Lamictal [lamotrigine]  Past Medical History:  Diagnosis Date  . Allergy   . Anxiety   . Arthritis    hips, knees and hands   . Blood transfusion without reported diagnosis   . Depression   . GERD (gastroesophageal reflux disease)     hx of   . H/O hiatal hernia   . History of chicken pox   . Hypercholesterolemia   . Hypertension   . Migraine   . Migraines   . Morbid obesity ([place])   . Positive TB test   . PTSD (post-traumatic stress disorder)   . Sleep apnea    no CPAP machine   . Ulcer, stomach peptic     Past Surgical History:  Procedure Laterality Date  . ABDOMINAL HYSTERECTOMY  1997 & 2006  . BREATH TEK H PYLORI  05/03/2012   Procedure: BREATH TEK H PYLORI;  Surgeon: [name], MD;  Location: [name];  Service: General;  Laterality: N/A;  . COLONOSCOPY     hx of benign polyps   . ESOPHAGOGASTRODUODENOSCOPY N/A 09/29/2017   Procedure: ESOPHAGOGASTRODUODENOSCOPY (EGD);  Surgeon: Ladene Artist, MD;  Location: [name];  Service: Endoscopy;  Laterality: N/A;  . FOOT SURGERY    . GASTRIC BYPASS    . GASTRIC ROUX-EN-Y N/A 07/05/2012   Procedure: LAPAROSCOPIC ROUX-EN-Y GASTRIC;  Surgeon: [name], MD;  Location: [facility];  Service: General;  Laterality: N/A;  . IRRIGATION AND DEBRIDEMENT ABSCESS N/A 01/01/2014   Procedure: IRRIGATION AND DEBRIDEMENT ABSCESS;  Surgeon: [name], MD;  Location: [facility];  Service: General;  Laterality: N/A;  . LAPAROSCOPIC LYSIS OF ADHESIONS N/A 07/05/2012   Procedure: LAPAROSCOPIC LYSIS OF ADHESIONS;  Surgeon: [name], MD;  Location: [facility];  Service: General;  Laterality: N/A;  repair of abdominal wall hernia  . right tube and ovary removed     . UPPER GI ENDOSCOPY N/A 07/05/2012   Procedure: UPPER GI ENDOSCOPY;  Surgeon: [name], MD;  Location: [facility];  Service: General;  Laterality: N/A;    Family History  Problem Relation Age of Onset  . Colon cancer Mother 28  . Depression Father   . Diabetes Father   . Heart disease Father   . Bladder Cancer Father   . Depression Sister   . Post-traumatic stress disorder Sister   . Alcohol abuse Paternal Uncle   . Bipolar disorder Cousin   . Anxiety disorder Sister   . Cancer Maternal Aunt         breast  . Breast cancer Maternal Aunt   . Melanoma Maternal Grandmother   . Colon cancer Maternal Grandmother        does not know age of onset  . Healthy Maternal Grandfather   . Healthy Paternal Grandmother   . Heart disease Paternal Grandfather   . Stroke Paternal Grandfather   . Diabetes Paternal Grandfather     Social History   Tobacco Use  . Smoking status: Former Smoker    Packs/day: 0.10    Years: 1.00    Pack years: 0.10    Types: Cigarettes    Quit date: 04/27/2004    Years since quitting: 14.4  . Smokeless tobacco: Never Used  Substance  Use Topics  . Alcohol use: No    Alcohol/week: 0.0 standard drinks    Comment: Occasional use/ 2 drinks a month    Subjective:  Patient started last week with hives last week- called a [facility] provider early last week and was treated with topical steroid last week; progressively worsened and went to [facility] on Sunday; was tested and treated for possible reaction to insect bite; RMSF and Lyme disease tests were negative; was started on Doxycycline for 21 days, Pepcid 40 mg daily, Benadryl 25 mg 2 x per day, Zyrtec 10 mg; denies any new soaps, foods, detergents or medications but was working on her deck with treated lumber and bought a new car last week- wonders about chemical exposure; Does feel that symptoms are improving some but is concerned that hives are persistent;     Objective:  Vitals:   10/20/18 1131  BP: 126/76  Pulse: 72  Temp: 98.2 F (36.8 C)  TempSrc: Oral  SpO2: 95%  Weight: 274 lb 12.8 oz (124.6 kg)  Height: 5\' 10"  (1.778 m)    General: Well developed, well nourished, in no acute distress  Skin : Warm and dry. Hives noted on lower extremities- bruising noted over upper right thigh Head: Normocephalic and atraumatic  Lungs: Respirations unlabored;  Neurologic: Alert and oriented; speech intact; face symmetrical; moves all extremities well; CNII-XII intact without focal deficit   Assessment:  1. Hives      Plan:  ? Etiology; agree with treatment plan started at [facility]- will extend prednisone and encouraged to take Benadryl every 4-6 hours, pepcid 20 mg bid; will refer to allergist- may be able to cancel this appointment; follow-up to be determined.   No follow-ups on file.  Orders Placed This Encounter  Procedures  . CBC w/Diff    Standing Status:   Future    Number of Occurrences:   1    Standing Expiration Date:   10/20/2019  . Ambulatory referral to Allergy    Referral Priority:   Routine    Referral Type:   Allergy Testing    Referral Reason:   Specialty Services Required    Requested Specialty:   Allergy    Number of Visits Requested:   1    Requested Prescriptions   Signed Prescriptions Disp Refills  . famotidine (PEPCID) 20 MG tablet 60 tablet 0    Sig: Take 1 tablet (20 mg total) by mouth 2 (two) times daily.  . predniSONE (DELTASONE) 10 MG tablet 20 tablet 0    Sig: Take 4 tablets x 2 days; take 3 tablets x 2 days; take 2 tablets x 2 days; take 1 tablet x 2 day;

## 2018-10-22 ENCOUNTER — Other Ambulatory Visit: Payer: Self-pay | Admitting: Gastroenterology

## 2018-10-25 ENCOUNTER — Encounter: Payer: Self-pay | Admitting: Family

## 2018-10-25 ENCOUNTER — Other Ambulatory Visit: Payer: Self-pay

## 2018-10-25 ENCOUNTER — Ambulatory Visit (INDEPENDENT_AMBULATORY_CARE_PROVIDER_SITE_OTHER): Payer: 59 | Admitting: Family

## 2018-10-25 VITALS — BP 130/78 | HR 80 | Temp 98.3°F | Ht 70.0 in

## 2018-10-25 DIAGNOSIS — L509 Urticaria, unspecified: Secondary | ICD-10-CM | POA: Diagnosis not present

## 2018-10-25 MED ORDER — PREDNISONE 10 MG PO TABS: ORAL_TABLET | ORAL | 0 refills | Status: DC

## 2018-10-25 MED ORDER — METHYLPREDNISOLONE ACETATE 40 MG/ML IJ SUSP
40.0000 mg | Freq: Once | INTRAMUSCULAR | Status: AC
  Administered 2018-10-25: 40 mg via INTRAMUSCULAR

## 2018-10-25 MED ORDER — EPINEPHRINE 0.3 MG/0.3ML IJ SOAJ: 0.3000 mg | INTRAMUSCULAR | 1 refills | Status: DC | PRN

## 2018-10-25 NOTE — Progress Notes (Signed)
Brittany [name] is a 53 y.o. female with the following history as recorded in EpicCare:  Patient Active Problem List   Diagnosis Date Noted  . Right thigh pain 05/19/2018  . Melena   . Acute gastrojejunal anastomotic ulcer   . Macrocytic anemia   . Acute GI bleeding 09/28/2017  . Epigastric abdominal pain 11/19/2016  . Incisional hernia with obstruction but no gangrene 07/21/2016  . Menopause 04/22/2016  . Routine general medical examination at a health care facility 03/18/2016  . Obesity 03/18/2016  . Severe episode of recurrent major depressive disorder, without psychotic features ([place])   . Depression, major, severe recurrence ([place]) 09/11/2015  . Major depressive disorder, recurrent, severe without psychotic features ([place])   . MDD (major depressive disorder) 10/30/2014  . MDD (major depressive disorder), recurrent severe, without psychosis ([place])   . Suicidal ideation 10/05/2014  . Depression 06/12/2014  . Rash 01/04/2014  . Normocytic anemia 01/04/2014  . DJD (degenerative joint disease) 01/04/2014  . Hypertension 01/04/2014  . Dyslipidemia 01/04/2014  . GERD (gastroesophageal reflux disease) 01/04/2014  . Obstructive sleep apnea 01/04/2014  . Status post small bowel resection 01/04/2014  . Abdominal abscess 01/01/2014  . Wound infection after surgery 01/01/2014  . PTSD (post-traumatic stress disorder) 11/23/2013  . Severe recurrent major depression without psychotic features ([place]) 11/22/2013  . 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 Morbid obesity, Weight - 312, BMI - 45.2 04/21/2012    Current Outpatient Medications  Medication Sig Dispense Refill  . acetaminophen (TYLENOL 8 HOUR ARTHRITIS PAIN) 650 MG CR tablet Take 2,600 mg by mouth once.    . calcium-vitamin D (OSCAL WITH D) 500-200 MG-UNIT tablet Take 3 tablets by mouth every morning. For bone health (Patient taking differently: Take 3 tablets by mouth every evening. For bone health)    .  cholecalciferol (VITAMIN D) 1000 units tablet Take 1 tablet (1,000 Units total) by mouth daily. For bone health    . doxycycline (DORYX) 100 MG EC tablet Take 100 mg by mouth 2 (two) times daily.    Marland Kitchen doxycycline (VIBRA-TABS) 100 MG tablet TK 1 T PO BID    . DULoxetine (CYMBALTA) 60 MG capsule Take 1 capsule (60 mg total) by mouth 2 (two) times daily. 180 capsule 0  . famotidine (PEPCID) 20 MG tablet Take 1 tablet (20 mg total) by mouth 2 (two) times daily. 60 tablet 0  . loratadine (CLARITIN) 10 MG tablet Take 10 mg by mouth daily.    Marland Kitchen LORazepam (ATIVAN) 0.5 MG tablet Take 1 tablet (0.5 mg total) by mouth daily as needed for anxiety. 20 tablet 0  . Multiple Vitamin (MULTI-VITAMIN) tablet Take by mouth.    . Multiple Vitamins-Minerals (MULTIVITAMIN WITH MINERALS) tablet Take 1 tablet by mouth daily. For low vitamin    . mupirocin ointment (BACTROBAN) 2 % APP EXT AA BID FOR 14 DAYS    . pantoprazole (PROTONIX) 40 MG tablet TAKE 1 TABLET BY MOUTH TWICE DAILY 60 tablet 0  . predniSONE (DELTASONE) 20 MG tablet Take 20 mg by mouth daily with breakfast.    . QUEtiapine (SEROQUEL) 50 MG tablet TAKE 3 TABLETS(150 MG) BY MOUTH AT BEDTIME 90 tablet 2  . triamcinolone cream (KENALOG) 0.1 % APPLY A THIN LAYER TO AFFECTED AREAS BID    . vitamin B-12 (CYANOCOBALAMIN) 500 MCG tablet Take 1 tablet (500 mcg total) by mouth daily. For Vitamin B-12 replacement    . vitamin E 1000 UNIT capsule Take 1,000 Units by  mouth daily.    . famotidine (PEPCID) 40 MG tablet Take 40 mg by mouth daily.    . predniSONE (DELTASONE) 10 MG tablet Take 4 tablets x 2 days; take 3 tablets x 2 days; take 2 tablets x 2 days; take 1 tablet x 2 day; (Patient not taking: Reported on 10/25/2018) 20 tablet 0   No current facility-administered medications for this visit.     Allergies: Contrast media [iodinated diagnostic agents], Amoxicillin, Bactrim [sulfamethoxazole-trimethoprim], and Lamictal [lamotrigine]  Past Medical History:   Diagnosis Date  . Allergy   . Anxiety   . Arthritis    hips, knees and hands   . Blood transfusion without reported diagnosis   . Depression   . GERD (gastroesophageal reflux disease)    hx of   . H/O hiatal hernia   . History of chicken pox   . Hypercholesterolemia   . Hypertension   . Migraine   . Migraines   . Morbid obesity ([place])   . Positive TB test   . PTSD (post-traumatic stress disorder)   . Sleep apnea    no CPAP machine   . Ulcer, stomach peptic     Past Surgical History:  Procedure Laterality Date  . ABDOMINAL HYSTERECTOMY  1997 & 2006  . BREATH TEK H PYLORI  05/03/2012   Procedure: BREATH TEK H PYLORI;  Surgeon: [name], MD;  Location: [name];  Service: General;  Laterality: N/A;  . COLONOSCOPY     hx of benign polyps   . ESOPHAGOGASTRODUODENOSCOPY N/A 09/29/2017   Procedure: ESOPHAGOGASTRODUODENOSCOPY (EGD);  Surgeon: Ladene Artist, MD;  Location: [name];  Service: Endoscopy;  Laterality: N/A;  . FOOT SURGERY    . GASTRIC BYPASS    . GASTRIC ROUX-EN-Y N/A 07/05/2012   Procedure: LAPAROSCOPIC ROUX-EN-Y GASTRIC;  Surgeon: [name], MD;  Location: [facility];  Service: General;  Laterality: N/A;  . IRRIGATION AND DEBRIDEMENT ABSCESS N/A 01/01/2014   Procedure: IRRIGATION AND DEBRIDEMENT ABSCESS;  Surgeon: [name], MD;  Location: [facility];  Service: General;  Laterality: N/A;  . LAPAROSCOPIC LYSIS OF ADHESIONS N/A 07/05/2012   Procedure: LAPAROSCOPIC LYSIS OF ADHESIONS;  Surgeon: [name], MD;  Location: [facility];  Service: General;  Laterality: N/A;  repair of abdominal wall hernia  . right tube and ovary removed     . UPPER GI ENDOSCOPY N/A 07/05/2012   Procedure: UPPER GI ENDOSCOPY;  Surgeon: [name], MD;  Location: [facility];  Service: General;  Laterality: N/A;    Family History  Problem Relation Age of Onset  . Colon cancer Mother 33  . Depression Father   . Diabetes Father   . Heart disease Father   . Bladder Cancer  Father   . Depression Sister   . Post-traumatic stress disorder Sister   . Alcohol abuse Paternal Uncle   . Bipolar disorder Cousin   . Anxiety disorder Sister   . Cancer Maternal Aunt        breast  . Breast cancer Maternal Aunt   . Melanoma Maternal Grandmother   . Colon cancer Maternal Grandmother        does not know age of onset  . Healthy Maternal Grandfather   . Healthy Paternal Grandmother   . Heart disease Paternal Grandfather   . Stroke Paternal Grandfather   . Diabetes Paternal Grandfather     Social History   Tobacco Use  . Smoking status: Former Smoker    Packs/day: 0.10  Years: 1.00    Pack years: 0.10    Types: Cigarettes    Quit date: 04/27/2004    Years since quitting: 14.5  . Smokeless tobacco: Never Used  Substance Use Topics  . Alcohol use: No    Alcohol/week: 0.0 standard drinks    Comment: Occasional use/ 2 drinks a month    Subjective:  Patient was seen last week as Urgent Care follow-up from hives thought to be related to spider or tick bite; at the [facility], she was started on Doxycycline, Prednisone and Pepcid; had negative RMSF and Lyme testing; was seen last Wednesday with concerns for persisting symptoms- prednisone taper was extended; today she is down to 20 mg of prednisone daily; became concerned when she woke up and her lips were swollen; notes that her legs look much better but her trunk and back are covered in hives; is scheduled to see allergist at the end of the month- will be in [place] taking care of her mother starting tomorrow and not able to see the allergist before the scheduled appointment.     Objective:  Vitals:   10/25/18 1407  BP: 130/78  Pulse: 80  Temp: 98.3 F (36.8 C)  TempSrc: Oral  SpO2: 96%  Height: 5\' 10"  (1.778 m)    General: Well developed, well nourished, in no acute distress  Skin : Warm and dry. Extensive urticaria noted on abdomen/ trunk; mild swelling noted around lips Head: Normocephalic and atraumatic  Lungs:  Respirations unlabored; clear to auscultation bilaterally without wheeze, rales, rhonchi  Extremities: No edema, cyanosis, clubbing  Vessels: Symmetric bilaterally  Neurologic: Alert and oriented; speech intact; face symmetrical; moves all extremities well; CNII-XII intact without focal deficit   Assessment:  1. Urticaria     Plan:  Even though Doxycycline was started after hives developed, will stop this medication in case it is contributing; Depo-Medrol IM 40 mg given today and will give another shot tomorrow; continue Benadryl and Pepcid; will plan to re-start oral prednisone taper on Wednesday; Rx for Epi-pen to have in case it is needed; keep planned follow-up with allergist for the end of July.   No follow-ups on file.  Orders Placed This Encounter  Procedures  . Ambulatory referral to Allergy    Referral Priority:   Emergency    Referral Type:   Allergy Testing    Referral Reason:   Specialty Services Required    Requested Specialty:   Allergy    Number of Visits Requested:   1    Requested Prescriptions    No prescriptions requested or ordered in this encounter

## 2018-10-26 ENCOUNTER — Ambulatory Visit (HOSPITAL_COMMUNITY): Payer: 59 | Admitting: Psychology

## 2018-10-26 ENCOUNTER — Ambulatory Visit (INDEPENDENT_AMBULATORY_CARE_PROVIDER_SITE_OTHER): Payer: 59 | Admitting: Family

## 2018-10-26 ENCOUNTER — Other Ambulatory Visit: Payer: Self-pay

## 2018-10-26 ENCOUNTER — Encounter: Payer: Self-pay | Admitting: Family

## 2018-10-26 ENCOUNTER — Encounter (HOSPITAL_COMMUNITY): Payer: Self-pay | Admitting: Psychology

## 2018-10-26 DIAGNOSIS — L509 Urticaria, unspecified: Secondary | ICD-10-CM

## 2018-10-26 MED ORDER — METHYLPREDNISOLONE ACETATE 40 MG/ML IJ SUSP
40.0000 mg | Freq: Once | INTRAMUSCULAR | Status: AC
  Administered 2018-10-26: 40 mg via INTRAMUSCULAR

## 2018-10-26 NOTE — Progress Notes (Signed)
Brittany [name] is a 53 y.o. female with the following history as recorded in EpicCare:  Patient Active Problem List   Diagnosis Date Noted  . Right thigh pain 05/19/2018  . Melena   . Acute gastrojejunal anastomotic ulcer   . Macrocytic anemia   . Acute GI bleeding 09/28/2017  . Epigastric abdominal pain 11/19/2016  . Incisional hernia with obstruction but no gangrene 07/21/2016  . Menopause 04/22/2016  . Routine general medical examination at a health care facility 03/18/2016  . Obesity 03/18/2016  . Severe episode of recurrent major depressive disorder, without psychotic features ([place])   . Depression, major, severe recurrence ([place]) 09/11/2015  . Major depressive disorder, recurrent, severe without psychotic features ([place])   . MDD (major depressive disorder) 10/30/2014  . MDD (major depressive disorder), recurrent severe, without psychosis ([place])   . Suicidal ideation 10/05/2014  . Depression 06/12/2014  . Rash 01/04/2014  . Normocytic anemia 01/04/2014  . DJD (degenerative joint disease) 01/04/2014  . Hypertension 01/04/2014  . Dyslipidemia 01/04/2014  . GERD (gastroesophageal reflux disease) 01/04/2014  . Obstructive sleep apnea 01/04/2014  . Status post small bowel resection 01/04/2014  . Abdominal abscess 01/01/2014  . Wound infection after surgery 01/01/2014  . PTSD (post-traumatic stress disorder) 11/23/2013  . Severe recurrent major depression without psychotic features ([place]) 11/22/2013  . 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 Morbid obesity, Weight - 312, BMI - 45.2 04/21/2012    Current Outpatient Medications  Medication Sig Dispense Refill  . acetaminophen (TYLENOL 8 HOUR ARTHRITIS PAIN) 650 MG CR tablet Take 2,600 mg by mouth once.    . calcium-vitamin D (OSCAL WITH D) 500-200 MG-UNIT tablet Take 3 tablets by mouth every morning. For bone health (Patient taking differently: Take 3 tablets by mouth every evening. For bone health)    .  cholecalciferol (VITAMIN D) 1000 units tablet Take 1 tablet (1,000 Units total) by mouth daily. For bone health    . doxycycline (DORYX) 100 MG EC tablet Take 100 mg by mouth 2 (two) times daily.    Marland Kitchen doxycycline (VIBRA-TABS) 100 MG tablet TK 1 T PO BID    . DULoxetine (CYMBALTA) 60 MG capsule Take 1 capsule (60 mg total) by mouth 2 (two) times daily. 180 capsule 0  . EPINEPHrine 0.3 mg/0.3 mL IJ SOAJ injection Inject 0.3 mLs (0.3 mg total) into the muscle as needed for anaphylaxis. 1 each 1  . famotidine (PEPCID) 20 MG tablet Take 1 tablet (20 mg total) by mouth 2 (two) times daily. 60 tablet 0  . famotidine (PEPCID) 40 MG tablet Take 40 mg by mouth daily.    Marland Kitchen loratadine (CLARITIN) 10 MG tablet Take 10 mg by mouth daily.    Marland Kitchen LORazepam (ATIVAN) 0.5 MG tablet Take 1 tablet (0.5 mg total) by mouth daily as needed for anxiety. 20 tablet 0  . Multiple Vitamin (MULTI-VITAMIN) tablet Take by mouth.    . Multiple Vitamins-Minerals (MULTIVITAMIN WITH MINERALS) tablet Take 1 tablet by mouth daily. For low vitamin    . mupirocin ointment (BACTROBAN) 2 % APP EXT AA BID FOR 14 DAYS    . pantoprazole (PROTONIX) 40 MG tablet TAKE 1 TABLET BY MOUTH TWICE DAILY 60 tablet 0  . predniSONE (DELTASONE) 10 MG tablet Take 4 tablets x 2 days; take 3 tablets x 2 days; take 2 tablets x 2 days; take 1 tablet x 2 day; 20 tablet 0  . predniSONE (DELTASONE) 20 MG tablet Take 20 mg by mouth  daily with breakfast.    . QUEtiapine (SEROQUEL) 50 MG tablet TAKE 3 TABLETS(150 MG) BY MOUTH AT BEDTIME 90 tablet 2  . triamcinolone cream (KENALOG) 0.1 % APPLY A THIN LAYER TO AFFECTED AREAS BID    . vitamin B-12 (CYANOCOBALAMIN) 500 MCG tablet Take 1 tablet (500 mcg total) by mouth daily. For Vitamin B-12 replacement    . vitamin E 1000 UNIT capsule Take 1,000 Units by mouth daily.     No current facility-administered medications for this visit.     Allergies: Contrast media [iodinated diagnostic agents], Amoxicillin, Bactrim  [sulfamethoxazole-trimethoprim], and Lamictal [lamotrigine]  Past Medical History:  Diagnosis Date  . Allergy   . Anxiety   . Arthritis    hips, knees and hands   . Blood transfusion without reported diagnosis   . Depression   . GERD (gastroesophageal reflux disease)    hx of   . H/O hiatal hernia   . History of chicken pox   . Hypercholesterolemia   . Hypertension   . Migraine   . Migraines   . Morbid obesity ([place])   . Positive TB test   . PTSD (post-traumatic stress disorder)   . Sleep apnea    no CPAP machine   . Ulcer, stomach peptic     Past Surgical History:  Procedure Laterality Date  . ABDOMINAL HYSTERECTOMY  1997 & 2006  . BREATH TEK H PYLORI  05/03/2012   Procedure: BREATH TEK H PYLORI;  Surgeon: [name], MD;  Location: [name];  Service: General;  Laterality: N/A;  . COLONOSCOPY     hx of benign polyps   . ESOPHAGOGASTRODUODENOSCOPY N/A 09/29/2017   Procedure: ESOPHAGOGASTRODUODENOSCOPY (EGD);  Surgeon: Ladene Artist, MD;  Location: [name];  Service: Endoscopy;  Laterality: N/A;  . FOOT SURGERY    . GASTRIC BYPASS    . GASTRIC ROUX-EN-Y N/A 07/05/2012   Procedure: LAPAROSCOPIC ROUX-EN-Y GASTRIC;  Surgeon: [name], MD;  Location: [facility];  Service: General;  Laterality: N/A;  . IRRIGATION AND DEBRIDEMENT ABSCESS N/A 01/01/2014   Procedure: IRRIGATION AND DEBRIDEMENT ABSCESS;  Surgeon: [name], MD;  Location: [facility];  Service: General;  Laterality: N/A;  . LAPAROSCOPIC LYSIS OF ADHESIONS N/A 07/05/2012   Procedure: LAPAROSCOPIC LYSIS OF ADHESIONS;  Surgeon: [name], MD;  Location: [facility];  Service: General;  Laterality: N/A;  repair of abdominal wall hernia  . right tube and ovary removed     . UPPER GI ENDOSCOPY N/A 07/05/2012   Procedure: UPPER GI ENDOSCOPY;  Surgeon: [name], MD;  Location: [facility];  Service: General;  Laterality: N/A;    Family History  Problem Relation Age of Onset  . Colon cancer Mother 17  .  Depression Father   . Diabetes Father   . Heart disease Father   . Bladder Cancer Father   . Depression Sister   . Post-traumatic stress disorder Sister   . Alcohol abuse Paternal Uncle   . Bipolar disorder Cousin   . Anxiety disorder Sister   . Cancer Maternal Aunt        breast  . Breast cancer Maternal Aunt   . Melanoma Maternal Grandmother   . Colon cancer Maternal Grandmother        does not know age of onset  . Healthy Maternal Grandfather   . Healthy Paternal Grandmother   . Heart disease Paternal Grandfather   . Stroke Paternal Grandfather   . Diabetes Paternal Grandfather  Social History   Tobacco Use  . Smoking status: Former Smoker    Packs/day: 0.10    Years: 1.00    Pack years: 0.10    Types: Cigarettes    Quit date: 04/27/2004    Years since quitting: 14.5  . Smokeless tobacco: Never Used  Substance Use Topics  . Alcohol use: No    Alcohol/week: 0.0 standard drinks    Comment: Occasional use/ 2 drinks a month    Subjective:  1 day follow-up to re-check hives/ give second Depo injection; Notes she is doing much better today; [place] take care of her mother x 2 weeks;    Objective:  There were no vitals filed for this visit.  General: Well developed, well nourished, in no acute distress  Skin : Warm and dry. Mild swelling noted around lips; improvement in hives on trunk and back compared to yesterday; Head: Normocephalic and atraumatic  Lungs: Respirations unlabored;  Neurologic: Alert and oriented; speech intact; face symmetrical;   Assessment:  1. Urticaria     Plan:  Improving; Depo-Medrol IM 40 mg given in office; start oral prednisone tomorrow as discussed at [facility] yesterday; keep planned follow-up with allergist later this month.   No follow-ups on file.  No orders of the defined types were placed in this encounter.   Requested Prescriptions    No prescriptions requested or ordered in this encounter

## 2018-10-26 NOTE — Progress Notes (Signed)
Brittany [name] is a 53 y.o. female patient who didn't show for her virtual webex therapy appointment.  Pt informed by email.        [name], [facility]

## 2018-11-08 ENCOUNTER — Encounter: Payer: Self-pay | Admitting: Allergy and Immunology

## 2018-11-08 ENCOUNTER — Ambulatory Visit (INDEPENDENT_AMBULATORY_CARE_PROVIDER_SITE_OTHER): Payer: 59 | Admitting: Allergy and Immunology

## 2018-11-08 ENCOUNTER — Other Ambulatory Visit: Payer: Self-pay

## 2018-11-08 VITALS — BP 112/78 | HR 80 | Temp 98.1°F | Resp 18 | Ht 68.5 in | Wt 264.0 lb

## 2018-11-08 DIAGNOSIS — R197 Diarrhea, unspecified: Secondary | ICD-10-CM

## 2018-11-08 DIAGNOSIS — T7840XD Allergy, unspecified, subsequent encounter: Secondary | ICD-10-CM

## 2018-11-08 DIAGNOSIS — H1013 Acute atopic conjunctivitis, bilateral: Secondary | ICD-10-CM | POA: Diagnosis not present

## 2018-11-08 DIAGNOSIS — T783XXD Angioneurotic edema, subsequent encounter: Secondary | ICD-10-CM | POA: Diagnosis not present

## 2018-11-08 DIAGNOSIS — J3089 Other allergic rhinitis: Secondary | ICD-10-CM

## 2018-11-08 DIAGNOSIS — H1010 Acute atopic conjunctivitis, unspecified eye: Secondary | ICD-10-CM | POA: Insufficient documentation

## 2018-11-08 DIAGNOSIS — L500 Allergic urticaria: Secondary | ICD-10-CM

## 2018-11-08 DIAGNOSIS — T783XXA Angioneurotic edema, initial encounter: Secondary | ICD-10-CM | POA: Insufficient documentation

## 2018-11-08 DIAGNOSIS — K219 Gastro-esophageal reflux disease without esophagitis: Secondary | ICD-10-CM

## 2018-11-08 DIAGNOSIS — T7840XA Allergy, unspecified, initial encounter: Secondary | ICD-10-CM | POA: Insufficient documentation

## 2018-11-08 DIAGNOSIS — J302 Other seasonal allergic rhinitis: Secondary | ICD-10-CM | POA: Insufficient documentation

## 2018-11-08 MED ORDER — LEVOCETIRIZINE DIHYDROCHLORIDE 5 MG PO TABS: 5.0000 mg | ORAL_TABLET | Freq: Every day | ORAL | 5 refills | Status: DC | PRN

## 2018-11-08 MED ORDER — FLUTICASONE PROPIONATE 50 MCG/ACT NA SUSP: 2.0000 | Freq: Every day | NASAL | 5 refills | Status: DC | PRN

## 2018-11-08 MED ORDER — PATADAY 0.2 % OP SOLN: 1.0000 [drp] | Freq: Every day | OPHTHALMIC | 5 refills | Status: DC | PRN

## 2018-11-08 MED ORDER — FAMOTIDINE 20 MG PO TABS: 20.0000 mg | ORAL_TABLET | Freq: Two times a day (BID) | ORAL | 5 refills | Status: DC | PRN

## 2018-11-08 NOTE — Progress Notes (Signed)
New Patient Note  RE: Brittany [name] MRN: [id number] DOB: October 05, 1965 Date of Office Visit: 11/08/2018  Referring provider: [name],* Primary care provider: [name], FNP  Chief Complaint: Allergic Reaction and Urticaria   History of present illness: Brittany [name] is a 53 y.o. female seen today in consultation requested by [name], FNP.  She reports that approximately 1 month ago she was out picking blueberries and noticed a "bull's-eye rash" on her right lower extremity.  At the same time, she noticed 2 or 3 hives on her wrist.  She did not experience concomitant cardiopulmonary or GI symptoms.  However, the hives gradually spread "all over".  There was no tick found on her leg and she had not noticed any insects arachnoids.  She notes that, in addition to picking blueberries, she had been and has been cleaning out an old rotten shed which had "every type of critter".  She had no dietary changes.  The hives are described as red, raised, and exquisitely pruritic.  The hives persisted despite corticosteroid injections and prednisone.  She reports that her Lyme test was negative, however she was started on doxycycline presumptively.  She discontinued doxycycline on day 8 because, in addition to the urticaria, she began to experience angioedema of the lips and ears.  The hives have persisted since discontinuing the doxycycline.  She has noted that the hives have gradually been decreasing, though she has been "eating Benadryl like candy." She reports that over the past 2 weeks she has not had much of an appetite and has had diarrhea after almost every meal.  She is unable to identify any specific food trigger. She experiences nasal congestion, rhinorrhea, sneezing, postnasal drainage, nasal pruritus, and ocular pruritus.  These symptoms are triggered by pollen and strong aromas, such as perfumes and colognes.  She takes loratadine in an attempt to control the  symptoms.  Assessment and plan: Recurrent urticaria Unclear etiology.  It is possible that the recurrent urticaria is secondary to mast cell destabilization after an insect or arachnid bite/sting.  Oftentimes, it takes a few weeks to months for the mast cell membranes to restabilize.  Skin tests to select food allergens were negative today. NSAIDs and emotional stress commonly exacerbate urticaria but are not the underlying etiology in this case. Physical urticarias are negative by history (i.e. pressure-induced, temperature, vibration, solar, etc.). History and lesions are not consistent with urticaria pigmentosa so I am not suspicious for mastocytosis. There are no concomitant symptoms concerning for anaphylaxis or constitutional symptoms worrisome for an underlying malignancy.  The urticaria seems to be gradually improving.  However, should the symptoms persist beyond 6 to 8 weeks, we will rule out other potential etiologies with labs.   The following labs orders have been provided (these labs will be drawn if the symptoms persist beyond 6 to 8 weeks since onset): FCeRI antibody, anti-thyroglobulin antibody, thyroid peroxidase antibody, tryptase, urea breath test, CBC, CMP, ESR, ANA, and galactose-alpha-1,3-galactose IgE level.  The patient will be called with further recommendations after lab results have returned.  Instructions have been discussed and provided for H1/H2 receptor blockade with titration to find lowest effective dose.  A prescription has been provided for levocetirizine (Xyzal), 5 mg daily as needed.  A prescription has been provided for famotidine (Pepcid) 20 mg twice daily as needed.  Should there be a significant increase or change in symptoms, a journal is to be kept recording any foods eaten, beverages consumed, medications taken  within a 6 hour period prior to the onset of symptoms, as well as record activities being performed, and environmental conditions. For any symptoms  concerning for anaphylaxis, epinephrine is to be administered and 911 is to be called immediately.  Angioedema Associated angioedema occurs in up to 50% of patients with chronic urticaria.  Treatment/diagnostic plan as outlined above.  Perennial and seasonal allergic rhinitis  Aeroallergen avoidance measures have been discussed and provided in written form.  Levocetirizine has been prescribed (as above).  A prescription has been provided for fluticasone nasal spray, 2 sprays per nostril once daily if needed. Proper nasal spray technique has been discussed and demonstrated.  Nasal saline spray (i.e., Simply Saline) or nasal saline lavage (i.e., NeilMed) is recommended as needed and prior to medicated nasal sprays.  Allergic conjunctivitis  Treatment plan as outlined above for allergic rhinitis.  A prescription has been provided for Pataday, one drop per eye daily as needed.  I have also recommended eye lubricant drops (i.e., Natural Tears) as needed.  Diarrhea Uncertain etiology. Skin tests to select food allergens were negative today. The negative predictive value of food allergen skin testing is excellent (approximately 95%). While this does not appear to be an IgE mediated issue, skin testing does not rule out food intolerances or cell-mediated enteropathies which may lend to GI symptoms. These etiologies are suggested when elimination of the responsible food leads to symptom resolution and re-introduction of the food is followed by the return of symptoms.   Keep a careful symptom/food journal and eliminate any food suspected of correlating with symptoms. Should symptoms concerning for anaphylaxis arise, 911 is to be called immediately.  If GI symptoms persist or progress, gastroenterologist evaluation may be warranted.   Meds ordered this encounter  Medications  . levocetirizine (XYZAL) 5 MG tablet    Sig: Take 1 tablet (5 mg total) by mouth daily as needed for allergies.     Dispense:  30 tablet    Refill:  5  . famotidine (PEPCID) 20 MG tablet    Sig: Take 1 tablet (20 mg total) by mouth 2 (two) times daily as needed for heartburn or indigestion.    Dispense:  60 tablet    Refill:  5  . fluticasone (FLONASE) 50 MCG/ACT nasal spray    Sig: Place 2 sprays into both nostrils daily as needed for allergies or rhinitis.    Dispense:  16 g    Refill:  5  . PATADAY 0.2 % SOLN    Sig: Place 1 drop into both eyes daily as needed.    Dispense:  2.5 mL    Refill:  5    Diagnostics: Environmental skin testing: Positive to ragweed pollen, weed pollen, and dust mite antigen. Food allergen skin testing: Negative despite a positive histamine control.    Physical examination: Blood pressure 112/78, pulse 80, temperature 98.1 F (36.7 C), temperature source Temporal, resp. rate 18, height 5' 8.5" (1.74 m), weight 264 lb (119.7 kg), SpO2 96 %.  General: Alert, interactive, in no acute distress. HEENT: TMs pearly gray, turbinates moderately edematous without discharge, post-pharynx mildly erythematous. Neck: Supple without lymphadenopathy. Lungs: Clear to auscultation without wheezing, rhonchi or rales. CV: Normal S1, S2 without murmurs. Abdomen: Nondistended, nontender. Skin: Warm and dry, without lesions or rashes. Extremities:  No clubbing, cyanosis or edema. Neuro:   Grossly intact.  Review of systems:  Review of systems negative except as noted in HPI / PMHx or noted below: Review of Systems  Constitutional:  Negative.   HENT: Negative.   Eyes: Negative.   Respiratory: Negative.   Cardiovascular: Negative.   Gastrointestinal: Negative.   Genitourinary: Negative.   Musculoskeletal: Negative.   Skin: Negative.   Neurological: Negative.   Endo/Heme/Allergies: Negative.   Psychiatric/Behavioral: Negative.     Past medical history:  Past Medical History:  Diagnosis Date  . Allergy   . Angio-edema   . Anxiety   . Arthritis    hips, knees and hands    . Blood transfusion without reported diagnosis   . Depression   . GERD (gastroesophageal reflux disease)    hx of   . H/O hiatal hernia   . History of chicken pox   . Hypercholesterolemia   . Hypertension   . Migraine   . Migraines   . Morbid obesity ([place])   . Positive TB test   . PTSD (post-traumatic stress disorder)   . Sleep apnea    no CPAP machine   . Ulcer, stomach peptic   . Urticaria     Past surgical history:  Past Surgical History:  Procedure Laterality Date  . ABDOMINAL HYSTERECTOMY  1997 & 2006  . BREATH TEK H PYLORI  05/03/2012   Procedure: BREATH TEK H PYLORI;  Surgeon: [name], MD;  Location: [name];  Service: General;  Laterality: N/A;  . COLONOSCOPY     hx of benign polyps   . ESOPHAGOGASTRODUODENOSCOPY N/A 09/29/2017   Procedure: ESOPHAGOGASTRODUODENOSCOPY (EGD);  Surgeon: Ladene Artist, MD;  Location: [name];  Service: Endoscopy;  Laterality: N/A;  . FOOT SURGERY    . GASTRIC BYPASS    . GASTRIC ROUX-EN-Y N/A 07/05/2012   Procedure: LAPAROSCOPIC ROUX-EN-Y GASTRIC;  Surgeon: [name], MD;  Location: [facility];  Service: General;  Laterality: N/A;  . IRRIGATION AND DEBRIDEMENT ABSCESS N/A 01/01/2014   Procedure: IRRIGATION AND DEBRIDEMENT ABSCESS;  Surgeon: [name], MD;  Location: [facility];  Service: General;  Laterality: N/A;  . LAPAROSCOPIC LYSIS OF ADHESIONS N/A 07/05/2012   Procedure: LAPAROSCOPIC LYSIS OF ADHESIONS;  Surgeon: [name], MD;  Location: [facility];  Service: General;  Laterality: N/A;  repair of abdominal wall hernia  . right tube and ovary removed     . UPPER GI ENDOSCOPY N/A 07/05/2012   Procedure: UPPER GI ENDOSCOPY;  Surgeon: [name], MD;  Location: [facility];  Service: General;  Laterality: N/A;    Family history: Family History  Problem Relation Age of Onset  . Colon cancer Mother 63  . Depression Father   . Diabetes Father   . Heart disease Father   . Bladder Cancer Father   . Depression  Sister   . Post-traumatic stress disorder Sister   . Alcohol abuse Paternal Uncle   . Bipolar disorder Cousin   . Anxiety disorder Sister   . Eczema Sister   . Cancer Maternal Aunt        breast  . Breast cancer Maternal Aunt   . Melanoma Maternal Grandmother   . Colon cancer Maternal Grandmother        does not know age of onset  . Healthy Maternal Grandfather   . Healthy Paternal Grandmother   . Heart disease Paternal Grandfather   . Stroke Paternal Grandfather   . Diabetes Paternal Grandfather     Social history: Social History   Socioeconomic History  . Marital status: Divorced    Spouse name: Not on file  . Number of children: 0  . Years  of education: 15  . Highest education level: Not on file  Occupational History  . Occupation: Software engineer   Social Needs  . Financial resource strain: Not on file  . Food insecurity    Worry: Not on file    Inability: Not on file  . Transportation needs    Medical: Not on file    Non-medical: Not on file  Tobacco Use  . Smoking status: Former Smoker    Packs/day: 0.10    Years: 1.00    Pack years: 0.10    Types: Cigarettes    Quit date: 04/27/2004    Years since quitting: 14.5  . Smokeless tobacco: Never Used  Substance and Sexual Activity  . Alcohol use: No    Alcohol/week: 0.0 standard drinks    Comment: Occasional use/ 2 drinks a month  . Drug use: No  . Sexual activity: Yes    Birth control/protection: Surgical    Comment: 1st intercourse 53 yo-More than 5 partners  Lifestyle  . Physical activity    Days per week: Not on file    Minutes per session: Not on file  . Stress: Not on file  Relationships  . Social Herbalist on phone: Not on file    Gets together: Not on file    Attends religious service: Not on file    Active member of club or organization: Not on file    Attends meetings of clubs or organizations: Not on file    Relationship status: Not on file  . Intimate partner  violence    Fear of current or ex partner: Not on file    Emotionally abused: Not on file    Physically abused: Not on file    Forced sexual activity: Not on file  Other Topics Concern  . Not on file  Social History Narrative   Fun: Color and walk, house renovations   Denies abuse and feels safe at home.    Environmental History: The patient lives in a 54 year old house with hardwood floors throughout and central air/heat.  There is no known mold/water damage in the home.  There are 3 dogs in the home which have access to her bedroom.  She is a former cigarette smoker but is unsure when she quit.  Allergies as of 11/08/2018      Reactions   Contrast Media [iodinated Diagnostic Agents] Hives   Amoxicillin Hives   .Marland KitchenHas patient had a PCN reaction causing immediate rash, facial/tongue/throat swelling, SOB or lightheadedness with hypotension: Yes Has patient had a PCN reaction causing severe rash involving mucus membranes or skin necrosis: No Has patient had a PCN reaction that required hospitalization No Has patient had a PCN reaction occurring within the last 10 years: No If all of the above answers are "NO", then may proceed with Cephalosporin use.   Bactrim [sulfamethoxazole-trimethoprim] Hives, Rash   Lamictal [lamotrigine] Hives, Rash      Medication List       Accurate as of November 08, 2018  1:01 PM. If you have any questions, ask your nurse or doctor.        STOP taking these medications   doxycycline 100 MG EC tablet Commonly known as: DORYX Stopped by: Edmonia Lynch, MD   doxycycline 100 MG tablet Commonly known as: VIBRA-TABS Stopped by: Edmonia Lynch, MD   multivitamin with minerals tablet Stopped by: Edmonia Lynch, MD   mupirocin ointment 2 % Commonly known as: Drue Stager  Stopped by: Edmonia Lynch, MD   pantoprazole 40 MG tablet Commonly known as: PROTONIX Stopped by: Edmonia Lynch, MD   predniSONE 10 MG tablet Commonly known as: DELTASONE  Stopped by: Edmonia Lynch, MD   predniSONE 20 MG tablet Commonly known as: DELTASONE Stopped by: Edmonia Lynch, MD   triamcinolone cream 0.1 % Commonly known as: KENALOG Stopped by: Edmonia Lynch, MD     TAKE these medications   calcium-vitamin D 500-200 MG-UNIT tablet Commonly known as: OSCAL WITH D Take 3 tablets by mouth every morning. For bone health What changed: when to take this   cholecalciferol 25 MCG (1000 UT) tablet Commonly known as: VITAMIN D Take 1 tablet (1,000 Units total) by mouth daily. For bone health   DULoxetine 60 MG capsule Commonly known as: CYMBALTA Take 1 capsule (60 mg total) by mouth 2 (two) times daily.   EPINEPHrine 0.3 mg/0.3 mL Soaj injection Commonly known as: EPI-PEN Inject 0.3 mLs (0.3 mg total) into the muscle as needed for anaphylaxis.   famotidine 20 MG tablet Commonly known as: PEPCID Take 1 tablet (20 mg total) by mouth 2 (two) times daily as needed for heartburn or indigestion. What changed:   medication strength  how much to take  when to take this  reasons to take this  Another medication with the same name was removed. Continue taking this medication, and follow the directions you see here. Changed by: Edmonia Lynch, MD   fluticasone 50 MCG/ACT nasal spray Commonly known as: FLONASE Place 2 sprays into both nostrils daily as needed for allergies or rhinitis. Started by: Edmonia Lynch, MD   levocetirizine 5 MG tablet Commonly known as: XYZAL Take 1 tablet (5 mg total) by mouth daily as needed for allergies. Started by: Edmonia Lynch, MD   loratadine 10 MG tablet Commonly known as: CLARITIN Take 10 mg by mouth daily.   LORazepam 0.5 MG tablet Commonly known as: Ativan Take 1 tablet (0.5 mg total) by mouth daily as needed for anxiety.   Multi-Vitamin tablet Take by mouth.   Pataday 0.2 % Soln Generic drug: Olopatadine HCl Place 1 drop into both eyes daily as needed. Started by: Edmonia Lynch, MD   QUEtiapine 50 MG tablet Commonly known as: SEROQUEL TAKE 3 TABLETS(150 MG) BY MOUTH AT BEDTIME   Tylenol 8 Hour Arthritis Pain 650 MG CR tablet Generic drug: acetaminophen Take 2,600 mg by mouth once.   vitamin B-12 500 MCG tablet Commonly known as: CYANOCOBALAMIN Take 1 tablet (500 mcg total) by mouth daily. For Vitamin B-12 replacement   vitamin E 1000 UNIT capsule Take 1,000 Units by mouth daily.       Known medication allergies: Allergies  Allergen Reactions  . Contrast Media [Iodinated Diagnostic Agents] Hives  . Amoxicillin Hives    .Marland KitchenHas patient had a PCN reaction causing immediate rash, facial/tongue/throat swelling, SOB or lightheadedness with hypotension: Yes Has patient had a PCN reaction causing severe rash involving mucus membranes or skin necrosis: No Has patient had a PCN reaction that required hospitalization No Has patient had a PCN reaction occurring within the last 10 years: No If all of the above answers are "NO", then may proceed with Cephalosporin use.   . Bactrim [Sulfamethoxazole-Trimethoprim] Hives and Rash  . Lamictal [Lamotrigine] Hives and Rash    I appreciate the opportunity to take part in Yaritzel's care. Please do not hesitate to contact me with questions.  Sincerely,   R.  Edgar Frisk, MD

## 2018-11-08 NOTE — Patient Instructions (Addendum)
Recurrent urticaria Unclear etiology.  It is possible that the recurrent urticaria is secondary to mast cell destabilization after an insect or arachnid bite/sting.  Oftentimes, it takes a few weeks to months for the mast cell membranes to restabilize.  Skin tests to select food allergens were negative today. NSAIDs and emotional stress commonly exacerbate urticaria but are not the underlying etiology in this case. Physical urticarias are negative by history (i.e. pressure-induced, temperature, vibration, solar, etc.). History and lesions are not consistent with urticaria pigmentosa so I am not suspicious for mastocytosis. There are no concomitant symptoms concerning for anaphylaxis or constitutional symptoms worrisome for an underlying malignancy.  The urticaria seems to be gradually improving.  However, should the symptoms persist beyond 6 to 8 weeks, we will rule out other potential etiologies with labs.   The following labs orders have been provided (these labs will be drawn if the symptoms persist beyond 6 to 8 weeks since onset): FCeRI antibody, anti-thyroglobulin antibody, thyroid peroxidase antibody, tryptase, urea breath test, CBC, CMP, ESR, ANA, and galactose-alpha-1,3-galactose IgE level.  The patient will be called with further recommendations after lab results have returned.  Instructions have been discussed and provided for H1/H2 receptor blockade with titration to find lowest effective dose.  A prescription has been provided for levocetirizine (Xyzal), 5 mg daily as needed.  A prescription has been provided for famotidine (Pepcid) 20 mg twice daily as needed.  Should there be a significant increase or change in symptoms, a journal is to be kept recording any foods eaten, beverages consumed, medications taken within a 6 hour period prior to the onset of symptoms, as well as record activities being performed, and environmental conditions. For any symptoms concerning for anaphylaxis,  epinephrine is to be administered and 911 is to be called immediately.  Angioedema Associated angioedema occurs in up to 50% of patients with chronic urticaria.  Treatment/diagnostic plan as outlined above.  Perennial and seasonal allergic rhinitis  Aeroallergen avoidance measures have been discussed and provided in written form.  Levocetirizine has been prescribed (as above).  A prescription has been provided for fluticasone nasal spray, 2 sprays per nostril once daily if needed. Proper nasal spray technique has been discussed and demonstrated.  Nasal saline spray (i.e., Simply Saline) or nasal saline lavage (i.e., NeilMed) is recommended as needed and prior to medicated nasal sprays.  Allergic conjunctivitis  Treatment plan as outlined above for allergic rhinitis.  A prescription has been provided for Pataday, one drop per eye daily as needed.  I have also recommended eye lubricant drops (i.e., Natural Tears) as needed.  Diarrhea Uncertain etiology. Skin tests to select food allergens were negative today. The negative predictive value of food allergen skin testing is excellent (approximately 95%). While this does not appear to be an IgE mediated issue, skin testing does not rule out food intolerances or cell-mediated enteropathies which may lend to GI symptoms. These etiologies are suggested when elimination of the responsible food leads to symptom resolution and re-introduction of the food is followed by the return of symptoms.   Keep a careful symptom/food journal and eliminate any food suspected of correlating with symptoms. Should symptoms concerning for anaphylaxis arise, 911 is to be called immediately.  If GI symptoms persist or progress, gastroenterologist evaluation may be warranted.   When lab results have returned the patient will be called with further recommendations.  Urticaria (Hives)  . Levocetirizine (Xyzal) 5 mg twice a day and famotidine (Pepcid) 20 mg twice  a day. If  no symptoms for 7-14 days then decrease to. . Levocetirizine (Xyzal) 5 mg twice a day and famotidine (Pepcid) 20 mg once a day.  If no symptoms for 7-14 days then decrease to. . Levocetirizine (Xyzal) 5 mg twice a day.  If no symptoms for 7-14 days then decrease to. . Levocetirizine (Xyzal) 5 mg once a day.  May use Benadryl (diphenhydramine) as needed for breakthrough symptoms       If symptoms return, then step up dosage  Control of [place] dust mites play a major role in allergic asthma and rhinitis.  They occur in environments with high humidity wherever human skin, the food for dust mites is found. High levels have been detected in dust obtained from mattresses, pillows, carpets, upholstered furniture, bed covers, clothes and soft toys.  The principal allergen of the house dust mite is found in its feces.  A gram of dust may contain 1,000 mites and 250,000 fecal particles.  Mite antigen is easily measured in the air during house cleaning activities.    1. Encase mattresses, including the box spring, and pillow, in an air tight cover.  Seal the zipper end of the encased mattresses with wide adhesive tape. 2. Wash the bedding in water of 130 degrees Farenheit weekly.  Avoid cotton comforters/quilts and flannel bedding: the most ideal bed covering is the dacron comforter. 3. Remove all upholstered furniture from the bedroom. 4. Remove carpets, carpet padding, rugs, and non-washable window drapes from the bedroom.  Wash drapes weekly or use plastic window coverings. 5. Remove all non-washable stuffed toys from the bedroom.  Wash stuffed toys weekly. 6. Have the room cleaned frequently with a vacuum cleaner and a damp dust-mop.  The patient should not be in a room which is being cleaned and should wait 1 hour after cleaning before going into the room. 7. Close and seal all heating outlets in the bedroom.  Otherwise, the room will become filled with dust-laden air.   An electric heater can be used to heat the room. 8. Reduce indoor humidity to less than 50%.  Do not use a humidifier.  Reducing Pollen Exposure  The American Academy of Allergy, Asthma and Immunology suggests the following steps to reduce your exposure to pollen during allergy seasons.    1. Do not hang sheets or clothing out to dry; pollen may collect on these items. 2. Do not mow lawns or spend time around freshly cut grass; mowing stirs up pollen. 3. Keep windows closed at night.  Keep car windows closed while driving. 4. Minimize morning activities outdoors, a time when pollen counts are usually at their highest. 5. Stay indoors as much as possible when pollen counts or humidity is high and on windy days when pollen tends to remain in the air longer. 6. Use air conditioning when possible.  Many air conditioners have filters that trap the pollen spores. 7. Use a HEPA room air filter to remove pollen form the indoor air you breathe.

## 2018-11-08 NOTE — Assessment & Plan Note (Signed)
   Treatment plan as outlined above for allergic rhinitis.  A prescription has been provided for Pataday, one drop per eye daily as needed.  I have also recommended eye lubricant drops (i.e., Natural Tears) as needed. 

## 2018-11-08 NOTE — Assessment & Plan Note (Addendum)
Uncertain etiology. Skin tests to select food allergens were negative today. The negative predictive value of food allergen skin testing is excellent (approximately 95%). While this does not appear to be an IgE mediated issue, skin testing does not rule out food intolerances or cell-mediated enteropathies which may lend to GI symptoms. These etiologies are suggested when elimination of the responsible food leads to symptom resolution and re-introduction of the food is followed by the return of symptoms.   Keep a careful symptom/food journal and eliminate any food suspected of correlating with symptoms. Should symptoms concerning for anaphylaxis arise, 911 is to be called immediately.  If GI symptoms persist or progress, gastroenterologist evaluation may be warranted.

## 2018-11-08 NOTE — Assessment & Plan Note (Signed)
Unclear etiology.  It is possible that the recurrent urticaria is secondary to mast cell destabilization after an insect or arachnid bite/sting.  Oftentimes, it takes a few weeks to months for the mast cell membranes to restabilize.  Skin tests to select food allergens were negative today. NSAIDs and emotional stress commonly exacerbate urticaria but are not the underlying etiology in this case. Physical urticarias are negative by history (i.e. pressure-induced, temperature, vibration, solar, etc.). History and lesions are not consistent with urticaria pigmentosa so I am not suspicious for mastocytosis. There are no concomitant symptoms concerning for anaphylaxis or constitutional symptoms worrisome for an underlying malignancy.  The urticaria seems to be gradually improving.  However, should the symptoms persist beyond 6 to 8 weeks, we will rule out other potential etiologies with labs.   The following labs orders have been provided (these labs will be drawn if the symptoms persist beyond 6 to 8 weeks since onset): FCeRI antibody, anti-thyroglobulin antibody, thyroid peroxidase antibody, tryptase, urea breath test, CBC, CMP, ESR, ANA, and galactose-alpha-1,3-galactose IgE level.  The patient will be called with further recommendations after lab results have returned.  Instructions have been discussed and provided for H1/H2 receptor blockade with titration to find lowest effective dose.  A prescription has been provided for levocetirizine (Xyzal), 5 mg daily as needed.  A prescription has been provided for famotidine (Pepcid) 20 mg twice daily as needed.  Should there be a significant increase or change in symptoms, a journal is to be kept recording any foods eaten, beverages consumed, medications taken within a 6 hour period prior to the onset of symptoms, as well as record activities being performed, and environmental conditions. For any symptoms concerning for anaphylaxis, epinephrine is to be  administered and 911 is to be called immediately.

## 2018-11-08 NOTE — Assessment & Plan Note (Signed)
Associated angioedema occurs in up to 50% of patients with chronic urticaria.  Treatment/diagnostic plan as outlined above. 

## 2018-11-08 NOTE — Assessment & Plan Note (Signed)
   Aeroallergen avoidance measures have been discussed and provided in written form.  Levocetirizine has been prescribed (as above).  A prescription has been provided for fluticasone nasal spray, 2 sprays per nostril once daily if needed. Proper nasal spray technique has been discussed and demonstrated.  Nasal saline spray (i.e., Simply Saline) or nasal saline lavage (i.e., NeilMed) is recommended as needed and prior to medicated nasal sprays.

## 2018-11-21 ENCOUNTER — Other Ambulatory Visit: Payer: Self-pay | Admitting: Gastroenterology

## 2018-11-23 ENCOUNTER — Ambulatory Visit (INDEPENDENT_AMBULATORY_CARE_PROVIDER_SITE_OTHER): Payer: 59 | Admitting: Psychiatry

## 2018-11-23 ENCOUNTER — Encounter (HOSPITAL_COMMUNITY): Payer: Self-pay | Admitting: Psychiatry

## 2018-11-23 ENCOUNTER — Other Ambulatory Visit: Payer: Self-pay

## 2018-11-23 DIAGNOSIS — F410 Panic disorder [episodic paroxysmal anxiety] without agoraphobia: Secondary | ICD-10-CM | POA: Diagnosis not present

## 2018-11-23 DIAGNOSIS — F3341 Major depressive disorder, recurrent, in partial remission: Secondary | ICD-10-CM | POA: Diagnosis not present

## 2018-11-23 DIAGNOSIS — F4310 Post-traumatic stress disorder, unspecified: Secondary | ICD-10-CM

## 2018-11-23 MED ORDER — QUETIAPINE FUMARATE 50 MG PO TABS: ORAL_TABLET | ORAL | 2 refills | Status: DC

## 2018-11-23 MED ORDER — DULOXETINE HCL 60 MG PO CPEP: 60.0000 mg | ORAL_CAPSULE | Freq: Two times a day (BID) | ORAL | 0 refills | Status: DC
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
## 2018-11-24 ENCOUNTER — Ambulatory Visit (HOSPITAL_COMMUNITY): Payer: 59 | Admitting: Psychiatry

## 2018-12-03 ENCOUNTER — Encounter: Payer: Self-pay | Admitting: Family

## 2018-12-04 LAB — H. PYLORI BREATH COLLECTION

## 2018-12-04 LAB — H. PYLORI BREATH TEST: H pylori Breath Test: NEGATIVE

## 2018-12-07 ENCOUNTER — Other Ambulatory Visit: Payer: Self-pay

## 2018-12-07 ENCOUNTER — Ambulatory Visit (INDEPENDENT_AMBULATORY_CARE_PROVIDER_SITE_OTHER): Payer: 59 | Admitting: Psychology

## 2018-12-07 DIAGNOSIS — F330 Major depressive disorder, recurrent, mild: Secondary | ICD-10-CM

## 2018-12-07 NOTE — Progress Notes (Signed)
Virtual Visit via Video Note  I connected with Brittany [name] on 12/07/18 at  9:00 AM EDT by a video enabled telemedicine application and verified that I am speaking with the correct person using two identifiers.   I discussed the limitations of evaluation and management by telemedicine and the availability of in person appointments. The patient expressed understanding and agreed to proceed.    I discussed the assessment and treatment plan with the patient. The patient was provided an opportunity to ask questions and all were answered. The patient agreed with the plan and demonstrated an understanding of the instructions.   The patient was advised to call back or seek an in-person evaluation if the symptoms worsen or if the condition fails to improve as anticipated.  I provided 35 minutes of non-face-to-face time during this encounter.   [name] [facility]    THERAPIST PROGRESS NOTE  Session Time: 9.06am-9.41am  Participation Level: Active  Behavioral Response: Well GroomedAlertaffect wnl  Type of Therapy: Individual Therapy  Treatment Goals addressed: Diagnosis: MDD, anxiety and goal 1.  Interventions: CBT and Strength-based  Summary: Brittany [name] is a 53 y.o. female who presents with affect wnl.  pt reported that past several weeks some stressor and ups and downs.  Pt reported that she is still dealing w/ hives and unknown origin- had more blood work for allergist last week and awaiting results.  Pt reported that visited w/ mom again recently and that was positive- didn't discuss previous conflict but was able to move forward and felt good about that.  Pt reported that had good time couple weeks ago w/ boyfriend, but did struggle this past week.  Pt reported boyfriend was going [place] w/ adult children and family meet up and pt wanted to join- but became clear to her this was not his intention.  Pt is aware that good to still have independence but also struggling w/  want for more together.  Pt reported that they planned to talk further this week.  Pt reports also stressor of having to replace AC unit.  Pt reports she feels that she is managing well through these stressors, using good communication and using her journal to put down thoughts and not ruminating on...   Suicidal/Homicidal: Nowithout intent/plan  Therapist Response: Assessed pt current functioning per pt report.  Processed w/ pt coping through recent stressors and how she has used her coping skills effectively.  Discussed norm to experience relationship challenges and using effective communication and understanding wants/needs on both sides. Plan: Return again in 3 weeks.  Diagnosis: MDD, Anxiety  [name], [facility] 12/07/2018

## 2018-12-10 LAB — THYROGLOBULIN ANTIBODY: Thyroglobulin Antibody: <1 IU/mL (ref 0.0–0.9)

## 2018-12-10 LAB — THYROID PEROXIDASE ANTIBODY: Thyroperoxidase Ab SerPl-aCnc: <9 IU/mL (ref 0–34)

## 2018-12-10 LAB — CBC WITH DIFFERENTIAL/PLATELET
Basophils Absolute: 0 10*3/uL (ref 0.0–0.2)
Basos: 0 %
EOS (ABSOLUTE): 0.1 10*3/uL (ref 0.0–0.4)
Eos: 1 %
Hematocrit: 39.8 % (ref 34.0–46.6)
Hemoglobin: 13.2 g/dL (ref 11.1–15.9)
Immature Grans (Abs): 0 10*3/uL (ref 0.0–0.1)
Immature Granulocytes: 0 %
Lymphocytes Absolute: 1.5 10*3/uL (ref 0.7–3.1)
Lymphs: 22 %
MCH: 33.2 pg — ABNORMAL HIGH (ref 26.6–33.0)
MCHC: 33.2 g/dL (ref 31.5–35.7)
MCV: 100 fL — ABNORMAL HIGH (ref 79–97)
Monocytes Absolute: 0.5 10*3/uL (ref 0.1–0.9)
Monocytes: 7 %
Neutrophils Absolute: 4.7 10*3/uL (ref 1.4–7.0)
Neutrophils: 70 %
Platelets: 362 10*3/uL (ref 150–450)
RBC: 3.98 x10E6/uL (ref 3.77–5.28)
RDW: 12.5 % (ref 11.7–15.4)
WBC: 6.7 10*3/uL (ref 3.4–10.8)

## 2018-12-10 LAB — ANA W/REFLEX: Anti Nuclear Antibody (ANA): NEGATIVE

## 2018-12-10 LAB — ALPHA-GAL PANEL
Alpha Gal IgE*: <0.1 kU/L (ref <0.10)
Beef (Bos spp) IgE: <0.1 kU/L (ref <0.35)
Class Interpretation: 0
Class Interpretation: 0
Class Interpretation: 0
Lamb/Mutton (Ovis spp) IgE: <0.1 kU/L (ref <0.35)
Pork (Sus spp) IgE: <0.1 kU/L (ref <0.35)

## 2018-12-10 LAB — SEDIMENTATION RATE: Sed Rate: 47 mm/hr — ABNORMAL HIGH (ref 0–40)

## 2018-12-10 LAB — TRYPTASE: Tryptase: 8.3 ug/L (ref 2.2–13.2)

## 2018-12-10 LAB — CHRONIC URTICARIA: cu index: <1.7 (ref <10)

## 2018-12-15 ENCOUNTER — Telehealth: Payer: Self-pay | Admitting: Allergy and Immunology

## 2018-12-15 NOTE — Telephone Encounter (Signed)
Called Brittany [name] and answered her questions with the lab results.  Brittany [name] will look into Xolair and will call us back if she considers using this biologic.  No further questions, Brittany [name] verbalized understanding. Call ended    Information for Xolair sent to patient.

## 2018-12-15 NOTE — Telephone Encounter (Signed)
Patient sent a my chart message requesting an appointment with Dr. Verlin Fester. She wrote a message on the chart that read: Still experiencing hives on a daily basis. She is taking several medications to control them, but still gets them. She said we did a lot of tests on her to determine the cause. She would like to know what steps to take next. I wanted to send this message to see if he wanted to see her or have a nurse call her.

## 2018-12-21 ENCOUNTER — Other Ambulatory Visit: Payer: Self-pay | Admitting: Gastroenterology

## 2018-12-28 ENCOUNTER — Other Ambulatory Visit: Payer: Self-pay

## 2018-12-28 ENCOUNTER — Ambulatory Visit (INDEPENDENT_AMBULATORY_CARE_PROVIDER_SITE_OTHER): Payer: 59 | Admitting: Psychology

## 2018-12-28 DIAGNOSIS — F331 Major depressive disorder, recurrent, moderate: Secondary | ICD-10-CM | POA: Diagnosis not present

## 2018-12-28 NOTE — Progress Notes (Signed)
Virtual Visit via Video Note  I connected with Brittany [name] on 12/28/18 at  9:00 AM EDT by a video enabled telemedicine application and verified that I am speaking with the correct person using two identifiers.   I discussed the limitations of evaluation and management by telemedicine and the availability of in person appointments. The patient expressed understanding and agreed to proceed.    I discussed the assessment and treatment plan with the patient. The patient was provided an opportunity to ask questions and all were answered. The patient agreed with the plan and demonstrated an understanding of the instructions.   The patient was advised to call back or seek an in-person evaluation if the symptoms worsen or if the condition fails to improve as anticipated.  I provided 43 minutes of non-face-to-face time during this encounter.   [name] [facility]    THERAPIST PROGRESS NOTE  Session Time: 9am-9.43am  Participation Level: Active  Behavioral Response: Well GroomedAlertDepressed  Type of Therapy: Individual Therapy  Treatment Goals addressed: Diagnosis: MDD and goal 1.  Interventions: CBT and Strength-based  Summary: Brittany [name] is a 53 y.o. female who presents with affect congruent w/ report of increased depressed mood.  Pt reported break up w/ guy she was dating as became apparent they were looking for differently things in commitment.  Pt reported this has been sad, but more so revisiting her loss of marriage and feeling sadness about that.  Pt reports she is still functioning and getting things taken care of w/ home and work- but aware of ruminating more.  Pt reported she is using her skills and this has been helpful.  Pt reported that she visited w/ mom and friend in [place] which was positive.  Pt is looking forward to her sister visiting at the end of the month.  Pt mindful of using her coping skills and connecting w/ supports..   Suicidal/Homicidal: Nowithout  intent/plan  Therapist Response: Assessed pt current functioning per pt report.  Processed w/pt coping w/ breakup and how this has related to loss of marriage and working through those feelings again- validated and normalized. Explored w/ pt use of her coping skills and acknowledging her feelings.  discussed w/ pt her supports and ways of continued interacting.  Plan: Return again in 2 weeks, via webex.   Diagnosis: MDD  [name] [facility] 12/28/2018

## 2019-01-05 ENCOUNTER — Encounter: Payer: Self-pay | Admitting: Gynecology

## 2019-01-11 ENCOUNTER — Ambulatory Visit (INDEPENDENT_AMBULATORY_CARE_PROVIDER_SITE_OTHER): Payer: 59 | Admitting: Psychology

## 2019-01-11 ENCOUNTER — Other Ambulatory Visit: Payer: Self-pay

## 2019-01-11 DIAGNOSIS — F331 Major depressive disorder, recurrent, moderate: Secondary | ICD-10-CM

## 2019-01-11 NOTE — Progress Notes (Signed)
   THERAPIST PROGRESS NOTE  Session Time: 9am-9.40am  Participation Level: Active  Behavioral Response: Well GroomedAlertaffect depressed  Type of Therapy: Individual Therapy  Treatment Goals addressed: Diagnosis: MDD and goal 1.  Interventions: CBT and Supportive  Summary: Brittany [name] is a 53 y.o. female who presents with affect slightly depressed- congruent w/ report of struggling w/ depressed moods over past couple of weeks. Pt reported that she is working hard to not isolate and not sit around.  Pt reported that she has been able to engage in things and motivate self to get up and go places.  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  Pt enjoyed having face to face interaction and being out for the day.  Pt good insight into not being good that she restarted her online dating accounts.  Pt reported wasn't ready for and surprisingly 4 guys had went on dates reached out- one who had started connecting w/ and then disappeared.  Pt reported that messed w/ her head a little.  Pt discussed some positives upcoming w/ sister and brother in law visiting this weekend and signed up for volunteer next month as well.  Pt was able to identify positives and importance of focus on these as well.  Suicidal/Homicidal: Nowithout intent/plan  Therapist Response: Assessed pt current functioning per pt report.  Processed w/pt coping w/ depressed mood and reflected use of coping skills and her strengths.  Assisted in reframing negative self talk and focus on engaging w/ relationships activities w/ friends/family.  Plan: Return again in 2 weeks,via webex.  Diagnosis: MDD   Brittany [name] [facility] 01/11/2019

## 2019-01-17 ENCOUNTER — Ambulatory Visit (INDEPENDENT_AMBULATORY_CARE_PROVIDER_SITE_OTHER): Payer: 59 | Admitting: Allergy

## 2019-01-17 ENCOUNTER — Other Ambulatory Visit: Payer: Self-pay

## 2019-01-17 ENCOUNTER — Encounter: Payer: Self-pay | Admitting: Allergy

## 2019-01-17 VITALS — BP 122/84 | HR 74 | Temp 98.2°F | Resp 18

## 2019-01-17 DIAGNOSIS — L501 Idiopathic urticaria: Secondary | ICD-10-CM

## 2019-01-17 DIAGNOSIS — J302 Other seasonal allergic rhinitis: Secondary | ICD-10-CM | POA: Diagnosis not present

## 2019-01-17 DIAGNOSIS — H1010 Acute atopic conjunctivitis, unspecified eye: Secondary | ICD-10-CM | POA: Diagnosis not present

## 2019-01-17 DIAGNOSIS — J3089 Other allergic rhinitis: Secondary | ICD-10-CM

## 2019-01-17 DIAGNOSIS — T783XXD Angioneurotic edema, subsequent encounter: Secondary | ICD-10-CM

## 2019-01-17 MED ORDER — OMALIZUMAB 150 MG ~~LOC~~ SOLR
300.0000 mg | SUBCUTANEOUS | Status: DC
  Administered 2019-01-17 – 2020-08-13 (×20): 300 mg via SUBCUTANEOUS

## 2019-01-17 MED ORDER — HYDROXYZINE HCL 25 MG PO TABS: 25.0000 mg | ORAL_TABLET | Freq: Three times a day (TID) | ORAL | 1 refills | Status: DC | PRN

## 2019-01-17 NOTE — Assessment & Plan Note (Signed)
   See assessment and plan as above for urticaria.  

## 2019-01-17 NOTE — Assessment & Plan Note (Signed)
Past history - 2020 skin testing was positive to ragweed, weed, dust mite. 2020 blood work was unremarkable for CBC diff, alpha gal, ANA, thyroid, CU index, H. pylori test, tryptase.  ESR was slightly elevated. Interim history - Persistent daily urticaria with major flares 1-2 times a week despite high dose antihistamine use. Sometimes has associated swelling in hands and ear. No respiratory compromise.   Discussed the risks and benefits of omalizumab injections and patient is agreeable to start.   Start Xolair injections 300mg every 4 weeks. Sample injection given today.   The risk of anaphylaxis is very low but recommend that you have your epinephrine on hand if needed for severe reactions.   Continue Xyzal 5mg twice a day.  Continue zyrtec 10mg twice a day.  Continue famotidine 20mg twice a day.  Stop benadryl and try hydroxyzine 25mg every 8 hours for breakthrough symptoms.   This may make you drowsy. Do not take both hydroxyzine and benadryl at the same time.  . Avoid the following potential triggers: alcohol, tight clothing, NSAIDs, heat. 

## 2019-01-17 NOTE — Patient Instructions (Addendum)
Start Xolair injections 300mg  every 4 weeks.  The risk of anaphyylaxis is very low but recommend that you have your epinephrine on hand if needed for severe reactions.    Continue Xyzal 5mg  twice a day.  Continue zyrtec 10mg  twice a day.  Continue famotidine 20mg  twice a day.  Stop benadryl and try hydroxyzine 25mg  every 8 hours for breakthrough symptoms.   This may make you drowsy. Do not take both hydroxyzine and benadryl at the same time.  Marland Kitchen Avoid the following potential triggers: alcohol, tight clothing, NSAIDs, heat.  Allergic rhino conjunctivitis  Continue Flonase 2 sprays daily for nasal congestion.  May use pataday 1 drop in each as daily as needed for itchy/watery eyes.  Continue environmental control measures - weed, ragweed, dust mites.   Follow up in 2 months for hive check up.

## 2019-01-17 NOTE — Assessment & Plan Note (Addendum)
Past history - 2020 skin testing was positive to ragweed, weed, dust mite. Interim history - symptoms stable with below regimen.  Continue Flonase 2 sprays daily for nasal congestion.  May use pataday 1 drop in each as daily as needed for itchy/watery eyes.  Continue environmental control measures.  Continue antihistamines as above.

## 2019-01-17 NOTE — Progress Notes (Signed)
Immunotherapy   Patient Details  Name: Brittany [name] MRN: [id number] Date of Birth: [birth year]  01/17/2019  [name] started injections for  Xoliar 300 mg  Frequency:4 week for Urticaria , and patient has  Epi-Pen:Epi-Pen Available  at her disposal. Patient was given injection without any c/o of pain at the site of injection. Patient waited her 30 mins in the exam room. No c/o of pain, redness or other symptoms after given the injection. Patient will follow schedule as planned.  Consent signed and patient instructions given.   Festus Holts [name] 01/17/2019, 4:03 PM

## 2019-01-17 NOTE — Progress Notes (Signed)
Follow Up Note  RE: Brittany [name] MRN: [id number] DOB: April 09, 1966 Date of Office Visit: 01/17/2019  Referring provider: [name],* Primary care provider: [name], [place]  Chief Complaint: Urticaria  History of Present Illness: I had the pleasure of seeing Brittany [name] for a follow up visit at the [facility] [place] on 01/17/2019. She is a 53 y.o. female, who is being followed for urticaria, angioedema, allergic rhino conjunctivitis, diarrhea. Today she is here for new complaint of hives and angioedema. Her previous allergy office visit was on 11/08/2018 with Dr. Verlin Fester.   Urticaria/angioedema Patient is still having issues with hives daily and has major flares 1-2 times a week.  Mainly gets swelling in the hands and her ears. No trouble breathing. She had a major flare earlier this week. She did drink beer over the weekend which may have triggered it.   Currently on Xyzal 63m BID, zyrtec 123mBID, Pepcid 2052mID and benadryl 80m80m4 times a day with minimal benefit. The antihistamines do cause some drowsiness but now working from home.  Hives worse when outdoors doing yardwork.  Last prednisone use was in July/August with some benefit.  Allergic rhino conjunctivitis  Using Flonase 2 spray per nostril once a day and pataday as needed with good benefit.   Diarrhea Resolved.  Assessment and Plan: Sruthi is a 53 y62. female with: Chronic idiopathic urticaria Past history - 2020 skin testing was positive to ragweed, weed, dust mite. 2020 blood work was unremarkable for CBC diff, alpha gal, ANA, thyroid, CU index, H. pylori test, tryptase.  ESR was slightly elevated. Interim history - Persistent daily urticaria with major flares 1-2 times a week despite high dose antihistamine use. Sometimes has associated swelling in hands and ear. No respiratory compromise.   Discussed the risks and benefits of omalizumab injections and patient is agreeable to  start.   Start Xolair injections 300mg66mry 4 weeks. Sample injection given today.   The risk of anaphylaxis is very low but recommend that you have your epinephrine on hand if needed for severe reactions.   Continue Xyzal 5mg t22me a day.  Continue zyrtec 10mg t55m a day.  Continue famotidine 20mg tw37ma day.  Stop benadryl and try hydroxyzine 80mg eve8m hours for breakthrough symptoms.   This may make you drowsy. Do not take both hydroxyzine and benadryl at the same time.  . Avoid tMarland [place] following potential triggers: alcohol, tight clothing, NSAIDs, heat.  Angioedema  See assessment and plan as above for urticaria.  Seasonal and perennial allergic rhinoconjunctivitis Past history - 2020 skin testing was positive to ragweed, weed, dust mite. Interim history - symptoms stable with below regimen.  Continue Flonase 2 sprays daily for nasal congestion.  May use pataday 1 drop in each as daily as needed for itchy/watery eyes.  Continue environmental control measures.  Continue antihistamines as above.  Return in about 2 months (around 03/19/2019).  Meds ordered this encounter  Medications  . hydrOXYzine (ATARAX/VISTARIL) 25 MG tablet    Sig: Take 1 tablet (25 mg total) by mouth 3 (three) times daily as needed (hives).    Dispense:  90 tablet    Refill:  1  . omalizumab (XOLAIR) injection 300 mg   Diagnostics: None.  Medication List:  Current Outpatient Medications  Medication Sig Dispense Refill  . acetaminophen (TYLENOL 8 HOUR ARTHRITIS PAIN) 650 MG CR tablet Take 2,600 mg by mouth once.    . calcium-vitamin D (  OSCAL WITH D) 500-200 MG-UNIT tablet Take 3 tablets by mouth every morning. For bone health (Patient taking differently: Take 3 tablets by mouth every evening. For bone health)    . cholecalciferol (VITAMIN D) 1000 units tablet Take 1 tablet (1,000 Units total) by mouth daily. For bone health    . DULoxetine (CYMBALTA) 60 MG capsule Take 1 capsule (60 mg  total) by mouth 2 (two) times daily. 180 capsule 0  . EPINEPHrine 0.3 mg/0.3 mL IJ SOAJ injection Inject 0.3 mLs (0.3 mg total) into the muscle as needed for anaphylaxis. 1 each 1  . famotidine (PEPCID) 20 MG tablet Take 1 tablet (20 mg total) by mouth 2 (two) times daily as needed for heartburn or indigestion. 60 tablet 5  . fluticasone (FLONASE) 50 MCG/ACT nasal spray Place 2 sprays into both nostrils daily as needed for allergies or rhinitis. 16 g 5  . levocetirizine (XYZAL) 5 MG tablet Take 1 tablet (5 mg total) by mouth daily as needed for allergies. 30 tablet 5  . LORazepam (ATIVAN) 0.5 MG tablet Take 1 tablet (0.5 mg total) by mouth daily as needed for anxiety. 20 tablet 0  . Multiple Vitamin (MULTI-VITAMIN) tablet Take by mouth.    . pantoprazole (PROTONIX) 40 MG tablet TAKE 1 TABLET BY MOUTH TWICE DAILY 60 tablet 0  . PATADAY 0.2 % SOLN Place 1 drop into both eyes daily as needed. 2.5 mL 5  . QUEtiapine (SEROQUEL) 50 MG tablet TAKE 3 TABLETS(150 MG) BY MOUTH AT BEDTIME 90 tablet 2  . vitamin B-12 (CYANOCOBALAMIN) 500 MCG tablet Take 1 tablet (500 mcg total) by mouth daily. For Vitamin B-12 replacement    . vitamin E 1000 UNIT capsule Take 1,000 Units by mouth daily.    . hydrOXYzine (ATARAX/VISTARIL) 25 MG tablet Take 1 tablet (25 mg total) by mouth 3 (three) times daily as needed (hives). 90 tablet 1   Current Facility-Administered Medications  Medication Dose Route Frequency Provider Last Rate Last Dose  . omalizumab Arvid Right) injection 300 mg  300 mg Subcutaneous Q28 days Garnet Sierras, DO   300 mg at 01/17/19 1607   Allergies: Allergies  Allergen Reactions  . Contrast Media [Iodinated Diagnostic Agents] Hives  . Amoxicillin Hives    .Marland KitchenHas patient had a PCN reaction causing immediate rash, facial/tongue/throat swelling, SOB or lightheadedness with hypotension: Yes Has patient had a PCN reaction causing severe rash involving mucus membranes or skin necrosis: No Has patient had a PCN  reaction that required hospitalization No Has patient had a PCN reaction occurring within the last 10 years: No If all of the above answers are "NO", then may proceed with Cephalosporin use.   . Bactrim [Sulfamethoxazole-Trimethoprim] Hives and Rash  . Lamictal [Lamotrigine] Hives and Rash   I reviewed her past medical history, social history, family history, and environmental history and no significant changes have been reported from her previous visit.  Review of Systems  Constitutional: Negative for appetite change, chills, fever and unexpected weight change.  HENT: Negative for congestion and rhinorrhea.   Eyes: Negative for itching.  Respiratory: Negative for cough, chest tightness, shortness of breath and wheezing.   Gastrointestinal: Negative for abdominal pain.  Skin: Positive for rash.  Allergic/Immunologic: Positive for environmental allergies.  Neurological: Negative for headaches.   Objective: BP 122/84 (BP Location: Right Arm, Patient Position: Sitting, Cuff Size: Normal)   Pulse 74   Temp 98.2 F (36.8 C)   Resp 18   SpO2 96%  There is  no height or weight on file to calculate BMI. Physical Exam  Constitutional: She is oriented to person, place, and time. She appears well-developed and well-nourished.  HENT:  Head: Normocephalic and atraumatic.  Right Ear: External ear normal.  Left Ear: External ear normal.  Nose: Nose normal.  Mouth/Throat: Oropharynx is clear and moist.  Eyes: Conjunctivae and EOM are normal.  Neck: Neck supple.  Cardiovascular: Normal rate, regular rhythm and normal heart sounds. Exam reveals no gallop and no friction rub.  No murmur heard. Pulmonary/Chest: Effort normal and breath sounds normal. She has no wheezes. She has no rales.  Neurological: She is alert and oriented to person, place, and time.  Skin: Skin is warm. Rash noted.  Diffuse erythematous urticarial rash on the torso, anterior chest and some on the scalp. Blanchable.    Psychiatric: She has a normal mood and affect. Her behavior is normal.  Nursing note and vitals reviewed.  Previous notes and tests were reviewed. The plan was reviewed with the patient/family, and all questions/concerned were addressed.  It was my pleasure to see Lizania today and participate in her care. Please feel free to contact me with any questions or concerns.  Sincerely,  Rexene Alberts, DO Allergy & Immunology  [facility] office: [phone] [facility] office: [place] office: [phone]

## 2019-01-19 ENCOUNTER — Telehealth: Payer: Self-pay | Admitting: *Deleted

## 2019-01-19 NOTE — Telephone Encounter (Signed)
Called and discussed with patient submit, approval and copay card.  Patient already has appt for next Xolair injections

## 2019-01-19 NOTE — Telephone Encounter (Signed)
-----   Message from Garnet Sierras, DO sent at 01/17/2019  3:40 PM EDT ----- Please start PA for Xolair 300mg  every 4 weeks for CIU. First dose sample given today. Thank you.

## 2019-01-21 ENCOUNTER — Other Ambulatory Visit: Payer: Self-pay | Admitting: Gynecology

## 2019-01-21 DIAGNOSIS — Z1231 Encounter for screening mammogram for malignant neoplasm of breast: Secondary | ICD-10-CM

## 2019-01-25 ENCOUNTER — Ambulatory Visit (INDEPENDENT_AMBULATORY_CARE_PROVIDER_SITE_OTHER): Payer: 59 | Admitting: Psychology

## 2019-01-25 DIAGNOSIS — F331 Major depressive disorder, recurrent, moderate: Secondary | ICD-10-CM

## 2019-01-25 NOTE — Progress Notes (Signed)
Virtual Visit via Video Note  I connected with Brittany [name] on 01/25/19 at 11:00 AM EDT by a video enabled telemedicine application and verified that I am speaking with the correct person using two identifiers.   I discussed the limitations of evaluation and management by telemedicine and the availability of in person appointments. The patient expressed understanding and agreed to proceed.  History of Present Illness:    Observations/Objective:   Assessment and Plan:   Follow Up Instructions:    I discussed the assessment and treatment plan with the patient. The patient was provided an opportunity to ask questions and all were answered. The patient agreed with the plan and demonstrated an understanding of the instructions.   The patient was advised to call back or seek an in-person evaluation if the symptoms worsen or if the condition fails to improve as anticipated.  I provided 40 minutes of non-face-to-face time during this encounter.   [name] [facility]    THERAPIST PROGRESS NOTE  Session Time: 11.01am-11.41am  Participation Level: Active  Behavioral Response: Well GroomedAlertDepressed  Type of Therapy: Individual Therapy  Treatment Goals addressed: Diagnosis: MDD and goal 1.  Interventions: CBT, Strength-based and Supportive  Summary: Brittany [name] is a 53 y.o. female who presents with affect congruent w/ report of depressed mood.  Pt reports she is still dealing w/ depression and taking day by day using her coping skills.  Pt reported that had another bad flare up of hives a week ago and went to allergist and started on xolair injection.  Pt reported that she had good birthday.  Pt reported struggled when received a birthday card from her exboyfriend wishing well and gift card- as heart felt and heart ache.  Pt reported positive of her sister and brother in law visiting.  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  Pt also working w/ reframing when ruminating on a distortion.  Pt did have a concern about manufacture change in one of her meds and agrees to f/u w/  Dr. Adele Schilder about.  Suicidal/Homicidal: Nowithout intent/plan  Therapist Response: Assessed pt current functioning per pt report.  Processed w/pt depression and use of coping skills.  Reflected to pt positive self care and encouraged continued ways of connecting w/ others.  Discussed ruminating thoughts and ways of reframing.    Plan: Return again in 3 weeks, via webex. F/u as discussed w/ Dr. Adele Schilder.  Diagnosis: MDD   [name] [facility] 01/25/2019

## 2019-02-14 ENCOUNTER — Ambulatory Visit (INDEPENDENT_AMBULATORY_CARE_PROVIDER_SITE_OTHER): Payer: 59

## 2019-02-14 ENCOUNTER — Other Ambulatory Visit: Payer: Self-pay

## 2019-02-14 DIAGNOSIS — L501 Idiopathic urticaria: Secondary | ICD-10-CM

## 2019-02-15 ENCOUNTER — Ambulatory Visit (INDEPENDENT_AMBULATORY_CARE_PROVIDER_SITE_OTHER): Payer: 59 | Admitting: Psychology

## 2019-02-15 ENCOUNTER — Other Ambulatory Visit: Payer: Self-pay

## 2019-02-15 DIAGNOSIS — F331 Major depressive disorder, recurrent, moderate: Secondary | ICD-10-CM | POA: Diagnosis not present
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
## 2019-02-23 ENCOUNTER — Other Ambulatory Visit: Payer: Self-pay

## 2019-02-23 ENCOUNTER — Encounter (HOSPITAL_COMMUNITY): Payer: Self-pay | Admitting: Psychiatry

## 2019-02-23 ENCOUNTER — Ambulatory Visit (INDEPENDENT_AMBULATORY_CARE_PROVIDER_SITE_OTHER): Payer: 59 | Admitting: Psychiatry

## 2019-02-23 DIAGNOSIS — F4310 Post-traumatic stress disorder, unspecified: Secondary | ICD-10-CM

## 2019-02-23 DIAGNOSIS — F410 Panic disorder [episodic paroxysmal anxiety] without agoraphobia: Secondary | ICD-10-CM

## 2019-02-23 DIAGNOSIS — F3341 Major depressive disorder, recurrent, in partial remission: Secondary | ICD-10-CM

## 2019-02-23 MED ORDER — DULOXETINE HCL 60 MG PO CPEP: 60.0000 mg | ORAL_CAPSULE | Freq: Two times a day (BID) | ORAL | 0 refills | Status: DC

## 2019-02-23 MED ORDER — QUETIAPINE FUMARATE 50 MG PO TABS: ORAL_TABLET | ORAL | 2 refills | Status: DC
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
## 2019-03-07 ENCOUNTER — Other Ambulatory Visit: Payer: Self-pay

## 2019-03-07 ENCOUNTER — Ambulatory Visit (INDEPENDENT_AMBULATORY_CARE_PROVIDER_SITE_OTHER): Payer: 59 | Admitting: Psychology

## 2019-03-07 DIAGNOSIS — F331 Major depressive disorder, recurrent, moderate: Secondary | ICD-10-CM

## 2019-03-07 NOTE — Progress Notes (Signed)
Virtual Visit via Video Note  I connected with Brittany [name] on 03/07/19 at 11:00 AM EST by a video enabled telemedicine application and verified that I am speaking with the correct person using two identifiers.   I discussed the limitations of evaluation and management by telemedicine and the availability of in person appointments. The patient expressed understanding and agreed to proceed.    I discussed the assessment and treatment plan with the patient. The patient was provided an opportunity to ask questions and all were answered. The patient agreed with the plan and demonstrated an understanding of the instructions.   The patient was advised to call back or seek an in-person evaluation if the symptoms worsen or if the condition fails to improve as anticipated.  I provided 83 minutes of non-face-to-face time during this encounter.   [name] [facility]    THERAPIST PROGRESS NOTE  Session Time: 11am-11.38am  Participation Level: Active  Behavioral Response: Well GroomedAlertDepressed  Type of Therapy: Individual Therapy  Treatment Goals addressed: Diagnosis: MDD and goal 1.  Interventions: CBT, Strength-based and Supportive  Summary: Brittany [name] is a 53 y.o. female who presents with affect congruent w/ report of depressed.  Pt reported that over the past couple of weeks struggling w/ increased depressed mood, increased ruminating on thoughts of exhusband and loss of marriage.  Pt reported that aware of factors w/ the holidays and his birthday impacting. Pt reported that the she focusing on using her skills- journaling, engaging in activities she enjoys-baking and getting out of house- visiting friend to bring her groceries.  Pt reported that she feels that she is managing fair and reframing negative self talk that emerges.  Pt discussed that she is looking forward to her time for Thanksgiving and is traveling to stay w/ her mom- recent covid test negative- and seeing sisters.  Pt  agrees to reach out if things worsen..   Suicidal/Homicidal: Nowithout intent/plan  Therapist Response: Assessed pt current functioning per pt report.  Processed w/pt increased depression and contributing factors.  Validated and normalized grief over loss of marriage and reflected pt use of coping skills.  Encouraged pt to continue engagement w/ supports and positive interactions.    Plan: Return again in 3 weeks, via webex.  Pt to f/u as scheduled w/ Dr. Adele Schilder.    Diagnosis: MDD   [name] [facility] 03/07/2019

## 2019-03-08 ENCOUNTER — Ambulatory Visit
Admission: RE | Admit: 2019-03-08 | Discharge: 2019-03-08 | Disposition: A | Discharge status: Home / Self Care | Payer: 59 | Source: Ambulatory Visit | Attending: Gynecology | Admitting: Gynecology

## 2019-03-08 DIAGNOSIS — Z1231 Encounter for screening mammogram for malignant neoplasm of breast: Secondary | ICD-10-CM

## 2019-03-14 ENCOUNTER — Ambulatory Visit (INDEPENDENT_AMBULATORY_CARE_PROVIDER_SITE_OTHER): Payer: 59 | Admitting: *Deleted

## 2019-03-14 ENCOUNTER — Other Ambulatory Visit: Payer: Self-pay

## 2019-03-14 DIAGNOSIS — L501 Idiopathic urticaria: Secondary | ICD-10-CM | POA: Diagnosis not present

## 2019-03-15 ENCOUNTER — Other Ambulatory Visit: Payer: Self-pay | Admitting: Allergy

## 2019-03-20 ENCOUNTER — Other Ambulatory Visit (HOSPITAL_COMMUNITY): Payer: Self-pay | Admitting: Psychiatry

## 2019-03-20 DIAGNOSIS — F410 Panic disorder [episodic paroxysmal anxiety] without agoraphobia: Secondary | ICD-10-CM

## 2019-03-28 ENCOUNTER — Ambulatory Visit (INDEPENDENT_AMBULATORY_CARE_PROVIDER_SITE_OTHER): Payer: Managed Care, Other (non HMO) | Admitting: Psychology

## 2019-03-28 ENCOUNTER — Other Ambulatory Visit: Payer: Self-pay

## 2019-03-28 DIAGNOSIS — F331 Major depressive disorder, recurrent, moderate: Secondary | ICD-10-CM | POA: Diagnosis not present

## 2019-03-28 NOTE — Progress Notes (Signed)
Virtual Visit via Video Note  I connected with [name] on 03/28/19 at 11:00 AM EST by a video enabled telemedicine application and verified that I am speaking with the correct person using two identifiers.   I discussed the limitations of evaluation and management by telemedicine and the availability of in person appointments. The patient expressed understanding and agreed to proceed.    I discussed the assessment and treatment plan with the patient. The patient was provided an opportunity to ask questions and all were answered. The patient agreed with the plan and demonstrated an understanding of the instructions.   The patient was advised to call back or seek an in-person evaluation if the symptoms worsen or if the condition fails to improve as anticipated.  I provided 36 minutes of non-face-to-face time during this encounter.   [name] [facility]    THERAPIST PROGRESS NOTE  Session Time: 11am-11.36am  Participation Level: Active  Behavioral Response: Well GroomedAlertDepressed  Type of Therapy: Individual Therapy  Treatment Goals addressed: Diagnosis: MDD and goal 1.  Interventions: CBT and Strength-based  Summary: Brittany [name] is a 53 y.o. female who presents with affect depressed.  Pt reported that she is continuing to struggle w/ periods of depressed thoughts and negative self talk.  Pt reported she is able to other times be ok and have some reflief from.  Pt reported that she has been engaging in baking to have something to focus on and looking forward to volunteer opportunity this week.  Pt reported she attended virtually to chruch this week for first time in awhile and the message spoke to her.  Pt discussed how it was focused on finding joy and how that is present even when unhappy.  Pt was able to relate and identify some experiences of joy she ha shad.  Pt reported that she has reached out to pastor, who is new, and will be meeting over zoom.  Pt reported that  daughter and grandkids visiting yesterday and she has been invited to Christmas dinner w/ them.  Pt discussed ways she is using coping skills and working through depression.  Suicidal/Homicidal: Nowithout intent/plan  Therapist Response: Assessed pt current functioning per pt report.  Processed w/pt depressed moods and thoughts.  Reflected pt use of coping skills.  Discussed interactions and ways of continuing to engage.   Plan: Return again in 3 weeks, via webex.  Pt to f/u as scheduled w/ Dr. Adele Schilder.   Diagnosis: MDD   [name] [facility] 03/28/2019

## 2019-04-11 ENCOUNTER — Other Ambulatory Visit: Payer: Self-pay

## 2019-04-11 ENCOUNTER — Ambulatory Visit (INDEPENDENT_AMBULATORY_CARE_PROVIDER_SITE_OTHER): Payer: 59

## 2019-04-11 DIAGNOSIS — L501 Idiopathic urticaria: Secondary | ICD-10-CM | POA: Diagnosis not present

## 2019-04-12 ENCOUNTER — Other Ambulatory Visit: Payer: Self-pay | Admitting: Allergy and Immunology

## 2019-04-13 ENCOUNTER — Telehealth: Payer: Self-pay | Admitting: *Deleted

## 2019-04-13 NOTE — Telephone Encounter (Signed)
Lvm for patient to call either [facility] or [place] office back.

## 2019-04-13 NOTE — Telephone Encounter (Signed)
Please call patient. Are these hives the same as the hives as she usually gets?  Is she taking her other medications?  Continue Xyzal 5mg  twice a day.  Continue zyrtec 10mg  twice a day.  Continue famotidine 20mg  twice a day.  She can come in this afternoon so we can look at it if it's not improving.

## 2019-04-13 NOTE — Telephone Encounter (Addendum)
Pt states that the Xolair has been helping, but woke up this morning with hives all over.  Her foot is just one big hive, the other ones are red itchy and raised.  Pt denies any breathing issues. Pt states that she stopped taking xyzal and zyrtec, but has been taking the famotidine.  Has been taking the hydroxyzine also. Explained to patient that she needed to continue to take the medications prescribed until Dr Maudie Mercury tells her otherwise.  Pt states that " I got too cocky too soon and stopped my medications".  Pt agreed to follow Dr Julianne Rice regimen and will come in for a f/u on Monday 04/18/2019.  Also, explained to patient that if she did not feel any better by the weekend, she could call the on call doctor.

## 2019-04-13 NOTE — Telephone Encounter (Signed)
Patient received Xolair injection on Monday and woke up this morning with hives on her foot. No other symptoms. Patient is wondering if this could be a reaction from the [place] and if she should be seen by a provider. Hives are a little itchy and painful.

## 2019-04-18 ENCOUNTER — Ambulatory Visit (INDEPENDENT_AMBULATORY_CARE_PROVIDER_SITE_OTHER): Payer: 59 | Admitting: Psychology

## 2019-04-18 ENCOUNTER — Ambulatory Visit (INDEPENDENT_AMBULATORY_CARE_PROVIDER_SITE_OTHER): Payer: 59 | Admitting: Allergy

## 2019-04-18 ENCOUNTER — Other Ambulatory Visit: Payer: Self-pay

## 2019-04-18 ENCOUNTER — Encounter: Payer: Self-pay | Admitting: Allergy

## 2019-04-18 VITALS — BP 122/88 | HR 74 | Temp 98.0°F | Resp 16 | Ht 68.0 in | Wt 271.8 lb

## 2019-04-18 DIAGNOSIS — J302 Other seasonal allergic rhinitis: Secondary | ICD-10-CM

## 2019-04-18 DIAGNOSIS — F331 Major depressive disorder, recurrent, moderate: Secondary | ICD-10-CM

## 2019-04-18 DIAGNOSIS — J3089 Other allergic rhinitis: Secondary | ICD-10-CM | POA: Diagnosis not present

## 2019-04-18 DIAGNOSIS — L501 Idiopathic urticaria: Secondary | ICD-10-CM

## 2019-04-18 DIAGNOSIS — H1010 Acute atopic conjunctivitis, unspecified eye: Secondary | ICD-10-CM

## 2019-04-18 MED ORDER — MONTELUKAST SODIUM 10 MG PO TABS: 10.0000 mg | ORAL_TABLET | Freq: Every day | ORAL | 5 refills | Status: DC

## 2019-04-18 NOTE — Progress Notes (Signed)
Follow Up Note  RE: Brittany [name] MRN: [id number] DOB: 15-Jun-1965 Date of Office Visit: 04/18/2019  Referring provider: [name],* Primary care provider: [name], [place]  Chief Complaint: Urticaria (f/u, had an outbreak of hives because she stopped taking her medication)  History of Present Illness: I had the pleasure of seeing Brittany [name] for a follow up visit at the [facility] on 04/18/2019. She is a 54 y.o. female, who is being followed for chronic idiopathic urticaria/angioedema and allergic rhinoconjunctivitis. Her previous allergy office visit was on 01/17/2019 with Dr. Maudie Mercury. Today is a regular follow up visit.  Chronic idiopathic urticaria Currently on Xolair 322m SQ Q4 weeks and noticed improvement with the second injection.   She stopped her Xyzal and zyrtec for a few weeks but then had a major flare. Now back to taking Xyzal 571mtwice a day, zyrtec 1036mwice a day and Pepcid 21m40mD. She is also taking hydroxyzine 25mg75m - noticed itching if not taking it. Also noticed, some increased in hives/pruritus when due for Xolair injection. Some fatigue with taking all these antihistamines.   Hands, feet and ears are the first things that break out.  Seasonal and perennial allergic rhinoconjunctivitis Taking Flonase with some benefit still has nasal congestion. Noticed a sore on right nostril.  Using pataday 1 drop in each as daily as needed for itchy/watery eyes.  Assessment and Plan: Brittany [name] is a 53 y.23 female with: Chronic idiopathic urticaria Past history - 2020 blood work was unremarkable for CBC diff, alpha gal, ANA, thyroid, CU index, H. pylori test, tryptase.  ESR was slightly elevated. Interim history - Doing much better with Xolair but had a flare when stopped Xyzal and zyrtec. Noticing some increased symptoms when due for Xolair.   Keep track of hives and when they are occurring - if happening more often when due for injections  may move up to every 3.5 weeks.   Continue Xolair injections 300mg 68my 4 weeks.   Stop Xyzal for now.   Increase zyrtec to 21mg (59mlls) twice a day - hopefully this will cause less drowsiness than Xyzal.   Continue famotidine 21mg tw54ma day.  May use hydroxyzine 25mg eve41m hours for breakthrough symptoms.   Avoid the following potential triggers: alcohol, tight clothing, NSAIDs, heat.  Seasonal and perennial allergic rhinoconjunctivitis Past history - 2020 skin testing was positive to ragweed, weed, dust mite. Interim history - symptoms stable, sore on right nares.  Continue Flonase 2 sprays daily for nasal congestion - hold Flonase for 1 week to let the right nasal sore heal.   May use pataday 1 drop in each as daily as needed for itchy/watery eyes.  Continue environmental control measures.  Continue antihistamines as above. Start Singulair (montelukast) 10 mg daily at night. This may also help with the hives.  Cautioned that in some children/adults can experience behavioral changes including hyperactivity, agitation, depression, sleep disturbances and suicidal ideations. These side effects are rare, but if you notice them you should notify me and discontinue Singulair (montelukast).  Return in about 3 months (around 07/17/2019).  Meds ordered this encounter  Medications  . montelukast (SINGULAIR) 10 MG tablet    Sig: Take 1 tablet (10 mg total) by mouth at bedtime.    Dispense:  30 tablet    Refill:  5   Diagnostics: None.   Medication List:  Current Outpatient Medications  Medication Sig Dispense Refill  . acetaminophen (  TYLENOL 8 HOUR ARTHRITIS PAIN) 650 MG CR tablet Take 2,600 mg by mouth once.    . calcium-vitamin D (OSCAL WITH D) 500-200 MG-UNIT tablet Take 3 tablets by mouth every morning. For bone health (Patient taking differently: Take 3 tablets by mouth every evening. For bone health)    . cetirizine (ZYRTEC) 10 MG tablet Take 20 mg by mouth 2 (two)  times daily.    . cholecalciferol (VITAMIN D) 1000 units tablet Take 1 tablet (1,000 Units total) by mouth daily. For bone health    . DULoxetine (CYMBALTA) 60 MG capsule Take 1 capsule (60 mg total) by mouth 2 (two) times daily. 180 capsule 0  . EPINEPHrine 0.3 mg/0.3 mL IJ SOAJ injection Inject 0.3 mLs (0.3 mg total) into the muscle as needed for anaphylaxis. 1 each 1  . famotidine (PEPCID) 20 MG tablet Take 1 tablet (20 mg total) by mouth 2 (two) times daily as needed for heartburn or indigestion. 60 tablet 5  . fluticasone (FLONASE) 50 MCG/ACT nasal spray SHAKE LIQUID AND USE 2 SPRAYS IN EACH NOSTRIL DAILY AS NEEDED FOR ALLERGIES OR RHINITIS 16 g 0  . hydrOXYzine (ATARAX/VISTARIL) 25 MG tablet TAKE 1 TABLET BY MOUTH THREE TIMES DAILY AS NEEDED 90 tablet 1  . LORazepam (ATIVAN) 0.5 MG tablet TAKE 1 TABLET(0.5 MG) BY MOUTH DAILY AS NEEDED FOR ANXIETY 20 tablet 0  . Multiple Vitamin (MULTI-VITAMIN) tablet Take by mouth.    . pantoprazole (PROTONIX) 40 MG tablet TAKE 1 TABLET BY MOUTH TWICE DAILY 60 tablet 0  . PATADAY 0.2 % SOLN Place 1 drop into both eyes daily as needed. 2.5 mL 5  . QUEtiapine (SEROQUEL) 50 MG tablet TAKE 3 TABLETS(150 MG) BY MOUTH AT BEDTIME 90 tablet 2  . vitamin B-12 (CYANOCOBALAMIN) 500 MCG tablet Take 1 tablet (500 mcg total) by mouth daily. For Vitamin B-12 replacement    . vitamin E 1000 UNIT capsule Take 1,000 Units by mouth daily.    . montelukast (SINGULAIR) 10 MG tablet Take 1 tablet (10 mg total) by mouth at bedtime. 30 tablet 5   Current Facility-Administered Medications  Medication Dose Route Frequency Provider Last Rate Last Admin  . omalizumab Arvid Right) injection 300 mg  300 mg Subcutaneous Q28 days Garnet Sierras, DO   300 mg at 04/11/19 1402   Allergies: Allergies  Allergen Reactions  . Contrast Media [Iodinated Diagnostic Agents] Hives  . Amoxicillin Hives    .Marland KitchenHas patient had a PCN reaction causing immediate rash, facial/tongue/throat swelling, SOB or  lightheadedness with hypotension: Yes Has patient had a PCN reaction causing severe rash involving mucus membranes or skin necrosis: No Has patient had a PCN reaction that required hospitalization No Has patient had a PCN reaction occurring within the last 10 years: No If all of the above answers are "NO", then may proceed with Cephalosporin use.   . Bactrim [Sulfamethoxazole-Trimethoprim] Hives and Rash  . Lamictal [Lamotrigine] Hives and Rash   I reviewed her past medical history, social history, family history, and environmental history and no significant changes have been reported from her previous visit.  Review of Systems  Constitutional: Negative for appetite change, chills, fever and unexpected weight change.  HENT: Positive for congestion. Negative for rhinorrhea.   Eyes: Negative for itching.  Respiratory: Negative for cough, chest tightness, shortness of breath and wheezing.   Gastrointestinal: Negative for abdominal pain.  Skin: Positive for rash.  Allergic/Immunologic: Positive for environmental allergies.  Neurological: Negative for headaches.   Objective: BP  122/88 (BP Location: Right Arm, Patient Position: Sitting, Cuff Size: Large)   Pulse 74   Temp 98 F (36.7 C) (Temporal)   Resp 16   Ht '5\' 8"'  (1.727 m)   Wt 271 lb 12.8 oz (123.3 kg)   SpO2 95%   BMI 41.33 kg/m  Body mass index is 41.33 kg/m. Physical Exam  Constitutional: She is oriented to person, place, and time. She appears well-developed and well-nourished.  HENT:  Head: Normocephalic and atraumatic.  Right Ear: External ear normal.  Left Ear: External ear normal.  Mouth/Throat: Oropharynx is clear and moist.  Right nares - small irritated area.   Eyes: Conjunctivae and EOM are normal.  Cardiovascular: Normal rate, regular rhythm and normal heart sounds. Exam reveals no gallop and no friction rub.  No murmur heard. Pulmonary/Chest: Effort normal and breath sounds normal. She has no wheezes. She has  no rales.  Musculoskeletal:     Cervical back: Neck supple.  Neurological: She is alert and oriented to person, place, and time.  Skin: Skin is warm. No rash noted.  Psychiatric: She has a normal mood and affect. Her behavior is normal.  Nursing note and vitals reviewed.  Previous notes and tests were reviewed. The plan was reviewed with the patient/family, and all questions/concerned were addressed.  It was my pleasure to see Brittany [name] today and participate in her care. Please feel free to contact me with any questions or concerns.  Sincerely,  Rexene Alberts, DO Allergy & Immunology  [facility] office: [phone] [facility] office: [place] office: [phone]

## 2019-04-18 NOTE — Patient Instructions (Addendum)
Chronic idiopathic urticaria  Keep track of hives and when they are occurring.  Continue Xolair injections 300mg  every 4 weeks.   Stop Xyzal for now.   Increase zyrtec to 20mg  (2 pills) twice a day.  Continue famotidine 20mg  twice a day.  May use hydroxyzine 25mg  every 8 hours for breakthrough symptoms.   Avoid the following potential triggers: alcohol, tight clothing, NSAIDs, heat.  Seasonal and perennial allergic rhinoconjunctivitis Past history - 2020 skin testing was positive to ragweed, weed, dust mite.  Continue Flonase 2 sprays daily for nasal congestion.  May use pataday 1 drop in each as daily as needed for itchy/watery eyes.  Continue environmental control measures.  Continue antihistamines as above. Start Singulair (montelukast) 10 mg daily at night. Cautioned that in some children/adults can experience behavioral changes including hyperactivity, agitation, depression, sleep disturbances and suicidal ideations. These side effects are rare, but if you notice them you should notify me and discontinue Singulair (montelukast).  Follow up in 3 months or sooner if needed.

## 2019-04-18 NOTE — Assessment & Plan Note (Signed)
Past history - 2020 skin testing was positive to ragweed, weed, dust mite. Interim history - symptoms stable, sore on right nares.  Continue Flonase 2 sprays daily for nasal congestion - hold Flonase for 1 week to let the right nasal sore heal.   May use pataday 1 drop in each as daily as needed for itchy/watery eyes.  Continue environmental control measures.  Continue antihistamines as above. Start Singulair (montelukast) 10 mg daily at night. This may also help with the hives.  Cautioned that in some children/adults can experience behavioral changes including hyperactivity, agitation, depression, sleep disturbances and suicidal ideations. These side effects are rare, but if you notice them you should notify me and discontinue Singulair (montelukast).

## 2019-04-18 NOTE — Progress Notes (Signed)
Virtual Visit via Video Note  I connected with Brittany [name] on 04/18/19 at 11:00 AM EST by a video enabled telemedicine application and verified that I am speaking with the correct person using two identifiers.   I discussed the limitations of evaluation and management by telemedicine and the availability of in person appointments. The patient expressed understanding and agreed to proceed.     I discussed the assessment and treatment plan with the patient. The patient was provided an opportunity to ask questions and all were answered. The patient agreed with the plan and demonstrated an understanding of the instructions.   The patient was advised to call back or seek an in-person evaluation if the symptoms worsen or if the condition fails to improve as anticipated.  I provided 33 minutes of non-face-to-face time during this encounter.   [name] [facility]    THERAPIST PROGRESS NOTE  Session Time: 11am-11.33am  Participation Level: Active  Behavioral Response: Well GroomedAlertaffect wnl  Type of Therapy: Individual Therapy  Treatment Goals addressed: Diagnosis: MDD and goal 1.  Interventions: CBT and Supportive  Summary: Brittany [name] is a 54 y.o. female who presents with affect wnl.  Pt reported on her holidays and things she engaged with.  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  Pt reported that she gave gifts of treats she had made to friends, neighbors, etc and spent day volunteering w/ toys for tots distrubution.  Pt reported although the volunteering was emotional for her working on "front lines" w/ those in need- was positive for her and glad she did.  Pt discussed that she feels that focusing on using her skills getting engaged w/ things and out as well as journaling helped her through and helped to left her out of depression low she was in.  Pt reported she has f/u w  her allergist and did have a bad break out of hives last week.  Pt reported that she had stopped a lot of her over the counter med regimen as expense, out of it feel would give and thought was doing better.  Pt reported recognized mistake and started back- will work w/her doctor on.    Suicidal/Homicidal: Nowithout intent/plan  Therapist Response: Assessed pt current functioning per pt report. Processed w/pt coping w/ depression and w/ holidays.  Reflected to pt good use of her skills and continuing use through these winter months ahead.    Plan: Return again in 3 weeks, via webex.  F/u as scheduled w/ her allergist and w/ psychiatrist.  Diagnosis: MDD   [name], [facility] 04/18/2019

## 2019-04-18 NOTE — Assessment & Plan Note (Signed)
Past history - 2020 blood work was unremarkable for CBC diff, alpha gal, ANA, thyroid, CU index, H. pylori test, tryptase.  ESR was slightly elevated. Interim history - Doing much better with Xolair but had a flare when stopped Xyzal and zyrtec. Noticing some increased symptoms when due for Xolair.   Keep track of hives and when they are occurring - if happening more often when due for injections may move up to every 3.5 weeks.   Continue Xolair injections 34m every 4 weeks.   Stop Xyzal for now.   Increase zyrtec to 247m(2 pills) twice a day - hopefully this will cause less drowsiness than Xyzal.   Continue famotidine 209mwice a day.  May use hydroxyzine 71m16mery 8 hours for breakthrough symptoms.   Avoid the following potential triggers: alcohol, tight clothing, NSAIDs, heat.

## 2019-05-09 ENCOUNTER — Ambulatory Visit (INDEPENDENT_AMBULATORY_CARE_PROVIDER_SITE_OTHER): Payer: 59

## 2019-05-09 ENCOUNTER — Other Ambulatory Visit: Payer: Self-pay

## 2019-05-09 DIAGNOSIS — L501 Idiopathic urticaria: Secondary | ICD-10-CM

## 2019-05-10 ENCOUNTER — Ambulatory Visit (INDEPENDENT_AMBULATORY_CARE_PROVIDER_SITE_OTHER): Payer: 59 | Admitting: Psychology

## 2019-05-10 DIAGNOSIS — F331 Major depressive disorder, recurrent, moderate: Secondary | ICD-10-CM | POA: Diagnosis not present

## 2019-05-10 NOTE — Progress Notes (Signed)
Virtual Visit via Video Note  I connected with Brittany [name] on 05/10/19 at  1:30 PM EST by a video enabled telemedicine application and verified that I am speaking with the correct person using two identifiers.   I discussed the limitations of evaluation and management by telemedicine and the availability of in person appointments. The patient expressed understanding and agreed to proceed.    I discussed the assessment and treatment plan with the patient. The patient was provided an opportunity to ask questions and all were answered. The patient agreed with the plan and demonstrated an understanding of the instructions.   The patient was advised to call back or seek an in-person evaluation if the symptoms worsen or if the condition fails to improve as anticipated.  I provided 48 minutes of non-face-to-face time during this encounter.   Brittany [name] [facility]    THERAPIST PROGRESS NOTE  Session Time: 1.30pm-2.18pm  Participation Level: Active  Behavioral Response: Well GroomedAlertaffect congruent  Type of Therapy: Individual Therapy  Treatment Goals addressed: Diagnosis: MDd and goal 1.  Interventions: CBT, Strength-based and Supportive  Summary: Brittany [name] is a 54 y.o. female who presents with affect congruent w/ report of recent stress.  pt reported that she feels that stress may be playing a big role in her hives as had a bad break out following stressful even this past week.  Pt updated about her older friend that had been helping buy her groceries for the past year.  Pt began recognizing that taking a toll on her to do so and had reached out to friend's sons. Pt recognized that this was a pattern for friend to be dependent on friends.  Pt aware was shifting from friendship to caregiver and sought many supports in discussing her concerns. Pt made decision to inform could no longer get her groceries for her and when asserted to her friend- friend became mad and upset- pt reported  that interaction wasn't positive.  Pt expressed how took toll on her emotionally and next dad just had to tune out and "go into cocoon"- pt informed family that ok- but wouldn't be available that day.  Pt reported improved next day and was able to enjoy time w/ her daughter and grandchildren at their visit.  Pt reported that hard making the right decision and few time she has made decision that was best for her.  pt reported that her hives really acted up followings this interaction. Pt feels good that she is coping through this hard stressors and normalized and validated her feelings given situation.    Suicidal/Homicidal: Nowithout intent/plan  Therapist Response: ASsessed pt current functioning per pt report.  Processed w/pt coping w/ stressor re: friends and decision to stepback from caretaking role she had become.  Assisted pt w/ validating, normalizing her feelings, reflected strengths in this tough decision.    Plan: Return again in 3 weeks, via webex.  F/u as scheduled w/ dr. Adele [name].  Diagnosis: MDD  Brittany [name] [facility] 05/10/2019

## 2019-05-13 ENCOUNTER — Other Ambulatory Visit: Payer: Self-pay | Admitting: Allergy

## 2019-05-26 ENCOUNTER — Other Ambulatory Visit: Payer: Self-pay

## 2019-05-26 ENCOUNTER — Ambulatory Visit (INDEPENDENT_AMBULATORY_CARE_PROVIDER_SITE_OTHER): Payer: 59 | Admitting: Psychiatry

## 2019-05-26 ENCOUNTER — Encounter (HOSPITAL_COMMUNITY): Payer: Self-pay | Admitting: Psychiatry

## 2019-05-26 DIAGNOSIS — F4310 Post-traumatic stress disorder, unspecified: Secondary | ICD-10-CM | POA: Diagnosis not present

## 2019-05-26 DIAGNOSIS — F410 Panic disorder [episodic paroxysmal anxiety] without agoraphobia: Secondary | ICD-10-CM | POA: Diagnosis not present

## 2019-05-26 DIAGNOSIS — F3341 Major depressive disorder, recurrent, in partial remission: Secondary | ICD-10-CM

## 2019-05-26 MED ORDER — QUETIAPINE FUMARATE 50 MG PO TABS: ORAL_TABLET | ORAL | 2 refills | Status: DC

## 2019-05-26 MED ORDER — DULOXETINE HCL 60 MG PO CPEP: 60.0000 mg | ORAL_CAPSULE | Freq: Two times a day (BID) | ORAL | 0 refills | Status: DC
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
## 2019-05-30 ENCOUNTER — Ambulatory Visit (INDEPENDENT_AMBULATORY_CARE_PROVIDER_SITE_OTHER): Payer: 59 | Admitting: Psychology

## 2019-05-30 ENCOUNTER — Other Ambulatory Visit: Payer: Self-pay

## 2019-05-30 DIAGNOSIS — F330 Major depressive disorder, recurrent, mild: Secondary | ICD-10-CM | POA: Diagnosis not present

## 2019-05-30 NOTE — Progress Notes (Signed)
Virtual Visit via Video Note  I connected with Brittany [name] on 05/30/19 at 10:00 AM EST by a video enabled telemedicine application and verified that I am speaking with the correct person using two identifiers.   I discussed the limitations of evaluation and management by telemedicine and the availability of in person appointments. The patient expressed understanding and agreed to proceed.    I discussed the assessment and treatment plan with the patient. The patient was provided an opportunity to ask questions and all were answered. The patient agreed with the plan and demonstrated an understanding of the instructions.   The patient was advised to call back or seek an in-person evaluation if the symptoms worsen or if the condition fails to improve as anticipated.  I provided 35 minutes of non-face-to-face time during this encounter.   [name] [facility]    THERAPIST PROGRESS NOTE  Session Time: 10am-10.35am  Participation Level: Active  Behavioral Response: Well GroomedAlertaffect wnl  Type of Therapy: Individual Therapy  Treatment Goals addressed: Diagnosis: MDD and goal 1.  Interventions: CBT and Supportive  Summary: Brittany [name] is a 54 y.o. female who presents with affect wnl.  Pt reported that she has been doing ok w/her mood.  Pt reported that not feeling depressed mood.  Pt reported that she went to visit her mom last week after she had a fall and so was busy w/ that and able to work from home.  Pt had hoped to see friend and sister when there but they were both quarantining from a possible exposure to covid.  Pt reported that felt good about time there and even some reminders from past- although sad dealt w/.  Pt reported that work is getting busy w/ projects.  Pt reported that she is keeping in touch w/ daughters and keeping boundaries needed w/ friend. Pt reported that will continue to find ways of connecting during time of working from home.   Suicidal/Homicidal:  Nowithout intent/plan  Therapist Response: ASsessed pt current functioning per pt report.  Processed w/pt coping w/ recent stressors.  Explored w/pt how has managed through triggers of losses and reflecting positive coping.  Discussed ways she is keeping connected.   Plan: Return again in 3 weeks, via webex.  Pt to f/u as scheduled w/ dr. Adele Schilder.  Diagnosis: MDD    [name] [facility] 05/30/2019

## 2019-06-06 ENCOUNTER — Other Ambulatory Visit: Payer: Self-pay

## 2019-06-06 ENCOUNTER — Ambulatory Visit (INDEPENDENT_AMBULATORY_CARE_PROVIDER_SITE_OTHER): Payer: 59

## 2019-06-06 DIAGNOSIS — L501 Idiopathic urticaria: Secondary | ICD-10-CM | POA: Diagnosis not present

## 2019-06-20 ENCOUNTER — Ambulatory Visit (HOSPITAL_COMMUNITY): Payer: 59 | Admitting: Psychology

## 2019-07-04 ENCOUNTER — Ambulatory Visit: Payer: Self-pay

## 2019-07-05 ENCOUNTER — Ambulatory Visit (INDEPENDENT_AMBULATORY_CARE_PROVIDER_SITE_OTHER): Payer: 59

## 2019-07-05 ENCOUNTER — Other Ambulatory Visit: Payer: Self-pay

## 2019-07-05 DIAGNOSIS — L501 Idiopathic urticaria: Secondary | ICD-10-CM | POA: Diagnosis not present

## 2019-07-07 ENCOUNTER — Other Ambulatory Visit: Payer: Self-pay

## 2019-07-07 ENCOUNTER — Ambulatory Visit (INDEPENDENT_AMBULATORY_CARE_PROVIDER_SITE_OTHER): Payer: 59 | Admitting: Psychology

## 2019-07-07 DIAGNOSIS — F330 Major depressive disorder, recurrent, mild: Secondary | ICD-10-CM | POA: Diagnosis not present

## 2019-07-07 NOTE — Progress Notes (Signed)
Virtual Visit via Video Note  I connected with Brittany [name] on 07/07/19 at 12:30 PM EDT by a video enabled telemedicine application and verified that I am speaking with the correct person using two identifiers.   I discussed the limitations of evaluation and management by telemedicine and the availability of in person appointments. The patient expressed understanding and agreed to proceed.    I discussed the assessment and treatment plan with the patient. The patient was provided an opportunity to ask questions and all were answered. The patient agreed with the plan and demonstrated an understanding of the instructions.   The patient was advised to call back or seek an in-person evaluation if the symptoms worsen or if the condition fails to improve as anticipated.  I provided 43 minutes of non-face-to-face time during this encounter.   [name] [facility]    THERAPIST PROGRESS NOTE  Session Time: 12.31pm-1.14pm  Participation Level: Active  Behavioral Response: Well GroomedAlertaffect wnl  Type of Therapy: Individual Therapy  Treatment Goals addressed: Diagnosis: MDD and goal 1.  Interventions: CBT and Strength-based  Summary: Brittany [name] is a 54 y.o. female who presents with affect wnl.  pt reported that she has been struggling again w/ hives acting up for couple of weeks.  Pt has a f/u w/ allergist in 2 weeks and has continued w/ her tx.  Pt reported that feels that stress related and has had increased stress w/ work being very busy. 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  Pt reported that she has had some downs w/her depression.  Pt continues to stay engaged w/ her family and maintain interactions, keep her routine and staying engaged w/ doing things.  Pt reported that she has had a couple dates that haven't turned into more and at times discouraged.  Pt reported that she does feel impact of social distance for over a year now.  Pt  discussed awareness of using her coping skills to maintain through stressors and moods.  Suicidal/Homicidal: Nowithout intent/plan  Therapist Response: Assessed pt current functioning per pt report. Processed w/pt coping w/ stressors and impact of social distancing.  Explored w/ pt ways she is staying connecting and her positive self care skills. Encouraged continued connecting w/ positive supports and engaging.   Plan: Return again next month via webex.  F/u as scheduled w/ Dr. Adele Schilder and allergist .  Diagnosis: MDD   [name], [facility] 07/07/2019

## 2019-07-11 ENCOUNTER — Other Ambulatory Visit: Payer: Self-pay | Admitting: Allergy

## 2019-07-14 ENCOUNTER — Ambulatory Visit: Payer: 59 | Attending: Internal Medicine

## 2019-07-14 DIAGNOSIS — Z23 Encounter for immunization: Secondary | ICD-10-CM

## 2019-07-14 NOTE — Progress Notes (Signed)
   Covid-19 Vaccination Clinic  Name:  Brittany [name]    MRN: [id number] DOB: May 24, 1965  07/14/2019  Brittany [name] was observed post Covid-19 immunization for 30 minutes based on pre-vaccination screening without incident. She was provided with Vaccine Information Sheet and instruction to access the V-Safe system.   Brittany [name] was instructed to call 911 with any severe reactions post vaccine: Marland Kitchen Difficulty breathing  . Swelling of face and throat  . A fast heartbeat  . A bad rash all over body  . Dizziness and weakness   Immunizations Administered    Name Date Dose VIS Date Route   Pfizer COVID-19 Vaccine 07/14/2019 11:48 AM 0.3 mL 03/25/2019 Intramuscular   Manufacturer: [place]   Lot: [id number]   [place]: [id number]

## 2019-07-18 ENCOUNTER — Other Ambulatory Visit: Payer: Self-pay

## 2019-07-18 ENCOUNTER — Encounter: Payer: Self-pay | Admitting: Allergy

## 2019-07-18 ENCOUNTER — Ambulatory Visit (INDEPENDENT_AMBULATORY_CARE_PROVIDER_SITE_OTHER): Payer: 59 | Admitting: Allergy

## 2019-07-18 VITALS — BP 114/78 | HR 77 | Temp 98.2°F | Resp 16 | Ht 69.0 in | Wt 269.0 lb

## 2019-07-18 DIAGNOSIS — J302 Other seasonal allergic rhinitis: Secondary | ICD-10-CM | POA: Diagnosis not present

## 2019-07-18 DIAGNOSIS — L501 Idiopathic urticaria: Secondary | ICD-10-CM | POA: Diagnosis not present

## 2019-07-18 DIAGNOSIS — H1010 Acute atopic conjunctivitis, unspecified eye: Secondary | ICD-10-CM

## 2019-07-18 DIAGNOSIS — J3089 Other allergic rhinitis: Secondary | ICD-10-CM | POA: Diagnosis not present

## 2019-07-18 NOTE — Progress Notes (Signed)
Follow Up Note  RE: Brittany [name] MRN: [id number] DOB: Aug 01, 1965 Date of Office Visit: 07/18/2019  Referring provider: [name],* Primary care provider: [name], [place]  Chief Complaint: Urticaria (about 2 weeks ago, it was really bad, but today its okay)  History of Present Illness: I had the pleasure of seeing Brittany [name] for a follow up visit at the [facility] on 07/18/2019. She is a 54 y.o. female, who is being followed for chronic idiopathic urticaria and allergic rhinoconjunctivitis. Her previous allergy office visit was on 04/18/2019 with Dr. Maudie Mercury. Today is a regular follow up visit.  Chronic idiopathic urticaria Patient broke out in hives about 2 weeks ago which lasted for about 1 week. This was 1 day before she was due for her Xolair injection on 3/23.  No triggers noted.  Currently on zyrtec 65m twice a day, Pepcid 281mtwice a day and Singulair at night.  Much less drowsy with zyrtec.   Took hydroxyzine as needed during flares with some benefit.   Reviewed images on the phone - large urticarial rash noted.   Seasonal and perennial allergic rhinoconjunctivitis Currently taking Singulair with some benefit. No issues with spring season. Stopped using Flonase as it was causing a sore in her nasal cavity. Not needed to use eye drops.   Assessment and Plan: Brittany [name] is a [age].o. female with: Chronic idiopathic urticaria Past history - 2020 blood work was unremarkable for CBC diff, alpha gal, ANA, thyroid, CU index, H. pylori test, tryptase.  ESR was slightly elevated. Interim history - had a major flare a day before she was due for Xolair in March. Zyrtec is less drowsy than Xyzal.   Keep track of hives and when they are occurring.  If you have another flare like the last time then call our office or send me a mychart message.   Change Xolair injections 30074mo every 3.5 weeks from 4 weeks.   Continue zyrtec to 85m65m pills)  twice a day.  Continue famotidine 85mg33mce a day.  May use hydroxyzine 25mg 37my 8 hours for breakthrough symptoms.   Avoid the following potential triggers: alcohol, tight clothing, NSAIDs, heat.  Seasonal and perennial allergic rhinoconjunctivitis Past history - 2020 skin testing was positive to ragweed, weed, dust mite. Interim history - symptoms stable, sore healed. Stopped using Flonase. Singulair helpful.   Stop Flonase for now.   May use pataday 1 drop in each as daily as needed for itchy/watery eyes.  Continue environmental control measures.  Continue antihistamines as above.  Continue Singulair (montelukast) 10 mg daily at night.   Return in about 3 months (around 10/17/2019).  Diagnostics: None.  Medication List:  Current Outpatient Medications  Medication Sig Dispense Refill  . acetaminophen (TYLENOL 8 HOUR ARTHRITIS PAIN) 650 MG CR tablet Take 2,600 mg by mouth once.    . calcium-vitamin D (OSCAL WITH D) 500-200 MG-UNIT tablet Take 3 tablets by mouth every morning. For bone health (Patient taking differently: Take 3 tablets by mouth every evening. For bone health)    . cetirizine (ZYRTEC) 10 MG tablet Take 20 mg by mouth 2 (two) times daily.    . cholecalciferol (VITAMIN D) 1000 units tablet Take 1 tablet (1,000 Units total) by mouth daily. For bone health    . DULoxetine (CYMBALTA) 60 MG capsule Take 1 capsule (60 mg total) by mouth 2 (two) times daily. 180 capsule 0  . EPINEPHrine 0.3 mg/0.3 mL IJ  SOAJ injection Inject 0.3 mLs (0.3 mg total) into the muscle as needed for anaphylaxis. 1 each 1  . famotidine (PEPCID) 20 MG tablet Take 1 tablet (20 mg total) by mouth 2 (two) times daily as needed for heartburn or indigestion. 60 tablet 5  . hydrOXYzine (ATARAX/VISTARIL) 25 MG tablet TAKE 1 TABLET BY MOUTH THREE TIMES DAILY AS NEEDED 90 tablet 0  . LORazepam (ATIVAN) 0.5 MG tablet TAKE 1 TABLET(0.5 MG) BY MOUTH DAILY AS NEEDED FOR ANXIETY 20 tablet 0  . montelukast  (SINGULAIR) 10 MG tablet Take 1 tablet (10 mg total) by mouth at bedtime. 30 tablet 5  . Multiple Vitamin (MULTI-VITAMIN) tablet Take by mouth.    . pantoprazole (PROTONIX) 40 MG tablet TAKE 1 TABLET BY MOUTH TWICE DAILY 60 tablet 0  . PATADAY 0.2 % SOLN Place 1 drop into both eyes daily as needed. 2.5 mL 5  . QUEtiapine (SEROQUEL) 50 MG tablet TAKE 3 TABLETS(150 MG) BY MOUTH AT BEDTIME 90 tablet 2  . vitamin B-12 (CYANOCOBALAMIN) 500 MCG tablet Take 1 tablet (500 mcg total) by mouth daily. For Vitamin B-12 replacement    . vitamin E 1000 UNIT capsule Take 1,000 Units by mouth daily.    . fluticasone (FLONASE) 50 MCG/ACT nasal spray SHAKE LIQUID AND USE 2 SPRAYS IN EACH NOSTRIL DAILY AS NEEDED FOR ALLERGIES OR RHINITIS (Patient not taking: Reported on 07/18/2019) 16 g 0   Current Facility-Administered Medications  Medication Dose Route Frequency Provider Last Rate Last Admin  . omalizumab Arvid Right) injection 300 mg  300 mg Subcutaneous Q28 days Garnet Sierras, DO   300 mg at 07/05/19 1655   Allergies: Allergies  Allergen Reactions  . Contrast Media [Iodinated Diagnostic Agents] Hives  . Amoxicillin Hives    .Marland KitchenHas patient had a PCN reaction causing immediate rash, facial/tongue/throat swelling, SOB or lightheadedness with hypotension: Yes Has patient had a PCN reaction causing severe rash involving mucus membranes or skin necrosis: No Has patient had a PCN reaction that required hospitalization No Has patient had a PCN reaction occurring within the last 10 years: No If all of the above answers are "NO", then may proceed with Cephalosporin use.   . Bactrim [Sulfamethoxazole-Trimethoprim] Hives and Rash  . Lamictal [Lamotrigine] Hives and Rash   I reviewed her past medical history, social history, family history, and environmental history and no significant changes have been reported from her previous visit.  Review of Systems  Constitutional: Negative for appetite change, chills, fever and  unexpected weight change.  HENT: Negative for congestion and rhinorrhea.   Eyes: Negative for itching.  Respiratory: Negative for cough, chest tightness, shortness of breath and wheezing.   Gastrointestinal: Negative for abdominal pain.  Skin: Negative for rash.  Allergic/Immunologic: Positive for environmental allergies.  Neurological: Negative for headaches.   Objective: BP 114/78 (BP Location: Left Arm, Patient Position: Sitting, Cuff Size: Large)   Pulse 77   Temp 98.2 F (36.8 C) (Temporal)   Resp 16   Ht '5\' 9"'  (1.753 m)   Wt 269 lb (122 kg)   SpO2 95%   BMI 39.72 kg/m  Body mass index is 39.72 kg/m. Physical Exam  Constitutional: She is oriented to person, place, and time. She appears well-developed and well-nourished.  HENT:  Head: Normocephalic and atraumatic.  Right Ear: External ear normal.  Left Ear: External ear normal.  Nose: Nose normal.  Mouth/Throat: Oropharynx is clear and moist.  Eyes: Conjunctivae and EOM are normal.  Cardiovascular: Normal rate, regular  rhythm and normal heart sounds. Exam reveals no gallop and no friction rub.  No murmur heard. Pulmonary/Chest: Effort normal and breath sounds normal. She has no wheezes. She has no rales.  Musculoskeletal:     Cervical back: Neck supple.  Neurological: She is alert and oriented to person, place, and time.  Skin: Skin is warm. No rash noted.  Psychiatric: She has a normal mood and affect. Her behavior is normal.  Nursing note and vitals reviewed.  Previous notes and tests were reviewed. The plan was reviewed with the patient/family, and all questions/concerned were addressed.  It was my pleasure to see Janele today and participate in her care. Please feel free to contact me with any questions or concerns.  Sincerely,  Rexene Alberts, DO Allergy & Immunology  [facility] office: [phone] [facility] office: [place] office: [phone]

## 2019-07-18 NOTE — Patient Instructions (Addendum)
Chronic idiopathic urticaria  Keep track of hives and when they are occurring.  If you have another flare like the last time then call our office or send me a mychart message.   Change Xolair injections 300mg  to every 3.5 weeks.  Continue zyrtec to 20mg  (2 pills) twice a day.  Continue famotidine 20mg  twice a day.  May use hydroxyzine 25mg  every 8 hours for breakthrough symptoms.   Avoid the following potential triggers: alcohol, tight clothing, NSAIDs, heat.  Seasonal and perennial allergic rhinoconjunctivitis Past history - 2020 skin testing was positive to ragweed, weed, dust mite.  Stop Flonase for now.   May use pataday 1 drop in each as daily as needed for itchy/watery eyes.  Continue environmental control measures.  Continue antihistamines as above.  Continue Singulair (montelukast) 10 mg daily at night.   Follow up in 3 months or sooner if needed.

## 2019-07-18 NOTE — Assessment & Plan Note (Signed)
Past history - 2020 skin testing was positive to ragweed, weed, dust mite. Interim history - symptoms stable, sore healed. Stopped using Flonase. Singulair helpful.   Stop Flonase for now.   May use pataday 1 drop in each as daily as needed for itchy/watery eyes.  Continue environmental control measures.  Continue antihistamines as above.  Continue Singulair (montelukast) 10 mg daily at night.

## 2019-07-18 NOTE — Assessment & Plan Note (Signed)
Past history - 2020 blood work was unremarkable for CBC diff, alpha gal, ANA, thyroid, CU index, H. pylori test, tryptase.  ESR was slightly elevated. Interim history - had a major flare a day before she was due for Xolair in March. Zyrtec is less drowsy than Xyzal.   Keep track of hives and when they are occurring.  If you have another flare like the last time then call our office or send me a mychart message.   Change Xolair injections 377m to every 3.5 weeks from 4 weeks.   Continue zyrtec to 249m(2 pills) twice a day.  Continue famotidine 2035mwice a day.  May use hydroxyzine 76m65mery 8 hours for breakthrough symptoms.   Avoid the following potential triggers: alcohol, tight clothing, NSAIDs, heat.

## 2019-07-21 ENCOUNTER — Telehealth: Payer: Self-pay | Admitting: *Deleted

## 2019-07-21 MED ORDER — OMALIZUMAB 150 MG ~~LOC~~ SOLR: 300.0000 mg | SUBCUTANEOUS | 11 refills | Status: DC

## 2019-07-21 NOTE — Telephone Encounter (Signed)
Called and advised patient new Rx with increased dose sent to [facility]. It will probably not go into effect until her May dose since April already scheduled

## 2019-07-21 NOTE — Telephone Encounter (Signed)
-----   Message from Garnet Sierras, DO sent at 07/18/2019  4:42 PM EDT ----- Kai Railsback,I want to move her Xolair 300mg  SQ Q4 weeks injections for hives to every 3.5 weeks as she tends to break out a few days before the injections. Is this possible? Thank you.

## 2019-07-29 ENCOUNTER — Other Ambulatory Visit: Payer: Self-pay

## 2019-07-29 ENCOUNTER — Ambulatory Visit (INDEPENDENT_AMBULATORY_CARE_PROVIDER_SITE_OTHER): Payer: 59

## 2019-07-29 DIAGNOSIS — L501 Idiopathic urticaria: Secondary | ICD-10-CM

## 2019-08-02 ENCOUNTER — Ambulatory Visit: Payer: Self-pay

## 2019-08-08 ENCOUNTER — Ambulatory Visit: Payer: 59 | Attending: Internal Medicine

## 2019-08-08 ENCOUNTER — Other Ambulatory Visit: Payer: Self-pay

## 2019-08-08 ENCOUNTER — Ambulatory Visit (INDEPENDENT_AMBULATORY_CARE_PROVIDER_SITE_OTHER): Payer: 59 | Admitting: Psychology

## 2019-08-08 DIAGNOSIS — Z23 Encounter for immunization: Secondary | ICD-10-CM

## 2019-08-08 DIAGNOSIS — F331 Major depressive disorder, recurrent, moderate: Secondary | ICD-10-CM | POA: Diagnosis not present

## 2019-08-08 NOTE — Progress Notes (Signed)
   Covid-19 Vaccination Clinic  Name:  Brittany [name]    MRN: [id number] DOB: 1966/01/12  08/08/2019  [name] was observed post Covid-19 immunization for 30 minutes based on pre-vaccination screening without incident. She was provided with Vaccine Information Sheet and instruction to access the V-Safe system.   [name] was instructed to call 911 with any severe reactions post vaccine: Marland Kitchen Difficulty breathing  . Swelling of face and throat  . A fast heartbeat  . A bad rash all over body  . Dizziness and weakness   Immunizations Administered    Name Date Dose VIS Date Route   Pfizer COVID-19 Vaccine 08/08/2019  9:28 AM 0.3 mL 06/08/2018 Intramuscular   Manufacturer: [place]   Lot: [id number]   [place]: [id number]

## 2019-08-08 NOTE — Progress Notes (Signed)
Virtual Visit via Video Note  I connected with Brittany [name] on 08/08/19 at 12:30 PM EDT by a video enabled telemedicine application and verified that I am speaking with the correct person using two identifiers.   I discussed the limitations of evaluation and management by telemedicine and the availability of in person appointments. The patient expressed understanding and agreed to proceed.    I discussed the assessment and treatment plan with the patient. The patient was provided an opportunity to ask questions and all were answered. The patient agreed with the plan and demonstrated an understanding of the instructions.   The patient was advised to call back or seek an in-person evaluation if the symptoms worsen or if the condition fails to improve as anticipated.  I provided 48 minutes of non-face-to-face time during this encounter.   Brittany [name] [facility]    THERAPIST PROGRESS NOTE  Session Time: 12.30pm-1.20pm  Participation Level: Active  Behavioral Response: Well GroomedAlertDepressed  Type of Therapy: Individual Therapy  Treatment Goals addressed: Diagnosis: MDD and goal 1.  Interventions: CBT, Supportive and Other: mindfulness  Summary: Brittany [name] is a 54 y.o. female who presents with affect depressed.  Pt reported that she has been struggling this past 2 weeks w/ increased lack of motivation, depression and anxiety.  Pt reported that had been doing well w/ engaging and accomplishing home projects and then just hit a wall.  Pt acknowledged that impact of returning to work 2 days a week may be taking a toll she didn't anticipate.  Pt reported he volunteered but was anticipating that would go in effect at end of summer.  Pt goes into the office Tues/Thursday and continues to work from home other days.  Pt discussed how even return to this has been a transition and change.  Pt was able to acknowledged that she has been hard on self about lack of motivation in past 2 weeks and need  for nonjudgment and allow for herself to transition.  Pt discussed some anxiety as well w/ having immunization and did receive her 2nd dose today.     Suicidal/Homicidal: Nowithout intent/plan  Therapist Response: Assessed pt current functioning per pt report. Processed w/pt coping w/ increased depressed and anxious moods.  Explored w/pt contributing factors and validated and normalized transition and stress can play.  discussed her self care and practice of non judgement for self coping.    Plan: Return again in 3 weeks, via video visit.  Pt to f/u as scheduled w/ Dr. Adele Schilder in 2 weeks.  Pt was informed of counselor's last day in this practice on 09/01/19 and discuss plans for transition at next appointment.    Diagnosis: MDD  Brittany [name] [facility] 08/08/2019

## 2019-08-18 ENCOUNTER — Other Ambulatory Visit (HOSPITAL_COMMUNITY): Payer: Self-pay

## 2019-08-18 DIAGNOSIS — F3341 Major depressive disorder, recurrent, in partial remission: Secondary | ICD-10-CM

## 2019-08-18 MED ORDER — QUETIAPINE FUMARATE 50 MG PO TABS: ORAL_TABLET | ORAL | 0 refills | Status: DC

## 2019-08-24 ENCOUNTER — Other Ambulatory Visit: Payer: Self-pay

## 2019-08-24 ENCOUNTER — Telehealth (INDEPENDENT_AMBULATORY_CARE_PROVIDER_SITE_OTHER): Payer: 59 | Admitting: Psychiatry

## 2019-08-24 ENCOUNTER — Encounter (HOSPITAL_COMMUNITY): Payer: Self-pay | Admitting: Psychiatry

## 2019-08-24 DIAGNOSIS — F410 Panic disorder [episodic paroxysmal anxiety] without agoraphobia: Secondary | ICD-10-CM | POA: Diagnosis not present

## 2019-08-24 DIAGNOSIS — F3341 Major depressive disorder, recurrent, in partial remission: Secondary | ICD-10-CM | POA: Diagnosis not present

## 2019-08-24 DIAGNOSIS — F4310 Post-traumatic stress disorder, unspecified: Secondary | ICD-10-CM | POA: Diagnosis not present

## 2019-08-24 MED ORDER — QUETIAPINE FUMARATE 50 MG PO TABS: ORAL_TABLET | ORAL | 2 refills | Status: DC

## 2019-08-24 MED ORDER — DULOXETINE HCL 60 MG PO CPEP: 60.0000 mg | ORAL_CAPSULE | Freq: Two times a day (BID) | ORAL | 0 refills | Status: DC
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
## 2019-08-25 ENCOUNTER — Ambulatory Visit (HOSPITAL_COMMUNITY): Payer: 59 | Admitting: Psychiatry

## 2019-08-26 ENCOUNTER — Ambulatory Visit (INDEPENDENT_AMBULATORY_CARE_PROVIDER_SITE_OTHER): Payer: 59

## 2019-08-26 ENCOUNTER — Other Ambulatory Visit: Payer: Self-pay

## 2019-08-26 DIAGNOSIS — L501 Idiopathic urticaria: Secondary | ICD-10-CM | POA: Diagnosis not present

## 2019-08-31 ENCOUNTER — Telehealth (HOSPITAL_COMMUNITY): Payer: 59 | Admitting: Psychology

## 2019-09-07 ENCOUNTER — Ambulatory Visit (INDEPENDENT_AMBULATORY_CARE_PROVIDER_SITE_OTHER): Payer: 59 | Admitting: Psychology

## 2019-09-07 DIAGNOSIS — F411 Generalized anxiety disorder: Secondary | ICD-10-CM | POA: Diagnosis not present

## 2019-09-07 DIAGNOSIS — F330 Major depressive disorder, recurrent, mild: Secondary | ICD-10-CM

## 2019-09-23 ENCOUNTER — Ambulatory Visit (INDEPENDENT_AMBULATORY_CARE_PROVIDER_SITE_OTHER): Payer: 59

## 2019-09-23 ENCOUNTER — Other Ambulatory Visit: Payer: Self-pay

## 2019-09-23 DIAGNOSIS — L501 Idiopathic urticaria: Secondary | ICD-10-CM | POA: Diagnosis not present

## 2019-09-28 ENCOUNTER — Ambulatory Visit (INDEPENDENT_AMBULATORY_CARE_PROVIDER_SITE_OTHER): Payer: 59 | Admitting: Psychology

## 2019-09-28 DIAGNOSIS — F411 Generalized anxiety disorder: Secondary | ICD-10-CM

## 2019-09-28 DIAGNOSIS — F331 Major depressive disorder, recurrent, moderate: Secondary | ICD-10-CM

## 2019-09-29 ENCOUNTER — Other Ambulatory Visit: Payer: Self-pay | Admitting: Allergy

## 2019-10-19 ENCOUNTER — Ambulatory Visit (INDEPENDENT_AMBULATORY_CARE_PROVIDER_SITE_OTHER): Payer: 59 | Admitting: Allergy

## 2019-10-19 ENCOUNTER — Other Ambulatory Visit: Payer: Self-pay

## 2019-10-19 ENCOUNTER — Encounter: Payer: Self-pay | Admitting: Allergy

## 2019-10-19 VITALS — BP 122/88 | HR 67 | Resp 18 | Ht 69.0 in | Wt 270.6 lb

## 2019-10-19 DIAGNOSIS — J302 Other seasonal allergic rhinitis: Secondary | ICD-10-CM

## 2019-10-19 DIAGNOSIS — L501 Idiopathic urticaria: Secondary | ICD-10-CM

## 2019-10-19 DIAGNOSIS — H1010 Acute atopic conjunctivitis, unspecified eye: Secondary | ICD-10-CM

## 2019-10-19 DIAGNOSIS — J3089 Other allergic rhinitis: Secondary | ICD-10-CM | POA: Diagnosis not present

## 2019-10-19 NOTE — Assessment & Plan Note (Addendum)
Past history - 2020 blood work was unremarkable for CBC diff, alpha gal, ANA, thyroid, CU index, H. pylori test, tryptase.  ESR was slightly elevated. Interim history - Has not had urticaria for nearly two months on more frequent dosing of xolair with continuation of prior regimen of antihistamines and montelukast.   Weaning schedule:  Take zyrtec 60m in the morning, 259min the evening + famotidine 2044mwice a day. If no hives for 2-3 weeks:  Take zyrtec 24m16mice a day + famotidine 20mg68mce a day. If no hives for 2-3 weeks:  Take zyrtec 24mg 67me a day + famotidine 20mg o50ma day. If no hives for 2-3 weeks:  Take zyrtec 24mg tw31ma day and stop famotidine.    Keep track of hives and when they are occurring.  If you have another flare like the last time then call our office or send me a mychart message.   Continue with Xolair injections 300mg to 19my 3.5 weeks.  May use hydroxyzine 25mg ever34mhours for breakthrough symptoms.   Avoid the following potential triggers: alcohol, tight clothing, NSAIDs, heat.

## 2019-10-19 NOTE — Progress Notes (Signed)
Follow Up Note  RE: Brittany [name] MRN: [id number] DOB: 1965-06-19 Date of Office Visit: 10/19/2019  Referring provider: [name],* Primary care provider: [name], [place]  Chief Complaint: Urticaria (improving since last visit, reapproval for Xolair)  History of Present Illness: I had the pleasure of seeing Brittany [name] for a follow up visit at the [facility] on 10/19/2019. She is a 54 y.o. female, who is being followed for chronic idiopathic urticaria and allergic rhinoconjunctivitis. Her previous allergy office visit was on 07/18/2019 with Dr. Maudie Mercury. Today is a regular follow up visit.  Chronic Idiopathic Urticaria She reports being completely clear of her urticaria for the last six to seven weeks which she attributes to the increase in frequency of her Xolair injections. She has been adherent to her zyrtec 38m bid, Pepcid 255mbid, Singulair 1061mvery evening, and Xolair 300m40mjections every three and a half weeks, and reports that she never misses any doses. She reports no adverse effects to her medications. She purchases her Pepcid and zyrtec through [employer] means to cut costs on these medications. She is still unsure of any identifiable trigger for her urticaria, but thinks stress and heat may contribute to the occurrences.  Seasonal and Perennial Allergic Rhinoconjunctivitis She reports that her allergies have been doing great. She takes Singulair 10mg83mry evening, and never misses doses. She no longer takes Flonase due to nose bleeds, and has not been needing to use Pataday eye drops.  Assessment and Plan: Brittany [name] is a 53 y.24 female with: Seasonal and perennial allergic rhinoconjunctivitis Past history - 2020 skin testing was positive to ragweed, weed, dust mite. Interim history - Condition is stable. Denies symptoms of allergic rhinoconjunctivitis. Continue current regimen of montelukast and antihistamines.  May use pataday 1 drop in  each as daily as needed for itchy/watery eyes.  Continue environmental control measures.  Continue antihistamines as above.  Continue Singulair (montelukast) 10 mg daily at night.  Chronic idiopathic urticaria Past history - 2020 blood work was unremarkable for CBC diff, alpha gal, ANA, thyroid, CU index, H. pylori test, tryptase.  ESR was slightly elevated. Interim history - Has not had urticaria for nearly two months on more frequent dosing of xolair with continuation of prior regimen of antihistamines and montelukast.   Weaning schedule:  Take zyrtec 10mg 75mhe morning, 20mg i15me evening + famotidine 20mg tw69ma day. If no hives for 2-3 weeks:  Take zyrtec 10mg twi96m day + famotidine 20mg twic40mday. If no hives for 2-3 weeks:  Take zyrtec 10mg twice83may + famotidine 20mg once a2m. If no hives for 2-3 weeks:  Take zyrtec 10mg twice a77m and stop famotidine.    Keep track of hives and when they are occurring.  If you have another flare like the last time then call our office or send me a mychart message.   Continue with Xolair injections 300mg to every56m weeks.  May use hydroxyzine 25mg every 8 h49m for breakthrough symptoms.   Avoid the following potential triggers: alcohol, tight clothing, NSAIDs, heat.  Return in about 4 months (around 02/19/2020).  Medication List:  Current Outpatient Medications  Medication Sig Dispense Refill  . acetaminophen (TYLENOL 8 HOUR ARTHRITIS PAIN) 650 MG CR tablet Take 2,600 mg by mouth once.    . calcium-vitamin D (OSCAL WITH D) 500-200 MG-UNIT tablet Take 3 tablets by mouth every morning. For bone health (Patient  taking differently: Take 3 tablets by mouth every evening. For bone health)    . cetirizine (ZYRTEC) 10 MG tablet Take 20 mg by mouth 2 (two) times daily.    . cholecalciferol (VITAMIN D) 1000 units tablet Take 1 tablet (1,000 Units total) by mouth daily. For bone health    . DULoxetine (CYMBALTA) 60 MG capsule  Take 1 capsule (60 mg total) by mouth 2 (two) times daily. 180 capsule 0  . EPINEPHrine 0.3 mg/0.3 mL IJ SOAJ injection Inject 0.3 mLs (0.3 mg total) into the muscle as needed for anaphylaxis. 1 each 1  . famotidine (PEPCID) 20 MG tablet Take 1 tablet (20 mg total) by mouth 2 (two) times daily as needed for heartburn or indigestion. 60 tablet 5  . hydrOXYzine (ATARAX/VISTARIL) 25 MG tablet TAKE 1 TABLET BY MOUTH THREE TIMES DAILY AS NEEDED 90 tablet 0  . LORazepam (ATIVAN) 0.5 MG tablet TAKE 1 TABLET(0.5 MG) BY MOUTH DAILY AS NEEDED FOR ANXIETY 20 tablet 0  . montelukast (SINGULAIR) 10 MG tablet TAKE 1 TABLET(10 MG) BY MOUTH AT BEDTIME 30 tablet 5  . Multiple Vitamin (MULTI-VITAMIN) tablet Take by mouth.    Marland Kitchen omalizumab (XOLAIR) 150 MG injection Inject 300 mg into the skin every 14 (fourteen) days. 4 each 11  . pantoprazole (PROTONIX) 40 MG tablet TAKE 1 TABLET BY MOUTH TWICE DAILY 60 tablet 0  . PATADAY 0.2 % SOLN Place 1 drop into both eyes daily as needed. 2.5 mL 5  . QUEtiapine (SEROQUEL) 50 MG tablet TAKE 3 TABLETS(150 MG) BY MOUTH AT BEDTIME 90 tablet 2  . vitamin B-12 (CYANOCOBALAMIN) 500 MCG tablet Take 1 tablet (500 mcg total) by mouth daily. For Vitamin B-12 replacement    . vitamin E 1000 UNIT capsule Take 1,000 Units by mouth daily.     Current Facility-Administered Medications  Medication Dose Route Frequency Provider Last Rate Last Admin  . omalizumab Arvid Right) injection 300 mg  300 mg Subcutaneous Q28 days Garnet Sierras, DO   300 mg at 09/23/19 1129   Allergies: Allergies  Allergen Reactions  . Contrast Media [Iodinated Diagnostic Agents] Hives  . Amoxicillin Hives    .Marland KitchenHas patient had a PCN reaction causing immediate rash, facial/tongue/throat swelling, SOB or lightheadedness with hypotension: Yes Has patient had a PCN reaction causing severe rash involving mucus membranes or skin necrosis: No Has patient had a PCN reaction that required hospitalization No Has patient had a PCN  reaction occurring within the last 10 years: No If all of the above answers are "NO", then may proceed with Cephalosporin use.   . Bactrim [Sulfamethoxazole-Trimethoprim] Hives and Rash  . Lamictal [Lamotrigine] Hives and Rash   I reviewed her past medical history, social history, family history, and environmental history and no significant changes have been reported from her previous visit.  Review of Systems  Constitutional: Negative for appetite change, chills, fatigue, fever and unexpected weight change.  HENT: Negative for congestion, rhinorrhea, sinus pressure and sneezing.   Eyes: Negative for pain and itching.  Respiratory: Negative for cough, chest tightness, shortness of breath and wheezing.   Gastrointestinal: Negative for abdominal pain.  Skin: Negative for rash.  Allergic/Immunologic: Positive for environmental allergies.  Neurological: Negative for headaches.   Objective: BP 122/88   Pulse 67   Resp 18   Ht '5\' 9"'  (1.753 m)   Wt 270 lb 9.6 oz (122.7 kg)   SpO2 96%   BMI 39.96 kg/m  Body mass index is 39.96 kg/m. Physical  Exam Vitals and nursing note reviewed.  Constitutional:      Appearance: Normal appearance. She is obese.  HENT:     Head: Normocephalic and atraumatic.     Right Ear: Tympanic membrane, ear canal and external ear normal.     Left Ear: Tympanic membrane, ear canal and external ear normal.     Nose: Nose normal.     Mouth/Throat:     Mouth: Mucous membranes are moist.     Pharynx: Oropharynx is clear.  Eyes:     Extraocular Movements: Extraocular movements intact.     Conjunctiva/sclera: Conjunctivae normal.     Pupils: Pupils are equal, round, and reactive to light.  Cardiovascular:     Rate and Rhythm: Normal rate and regular rhythm.     Heart sounds: Normal heart sounds. No murmur heard.  No gallop.   Pulmonary:     Effort: Pulmonary effort is normal.     Breath sounds: Normal breath sounds. No wheezing, rhonchi or rales.    Musculoskeletal:        General: No swelling or deformity. Normal range of motion.     Cervical back: Normal range of motion and neck supple.  Skin:    General: Skin is warm and dry.     Findings: No rash.  Neurological:     Mental Status: She is alert and oriented to person, place, and time.  Psychiatric:        Mood and Affect: Mood normal.        Behavior: Behavior normal.        Thought Content: Thought content normal.        Judgment: Judgment normal.    Previous notes and tests were reviewed. The plan was reviewed with the patient/family, and all questions/concerned were addressed.  It was my pleasure to see Brittany [name] today and participate in her care. Please feel free to contact me with any questions or concerns.  Sincerely,  Rexene Alberts, DO Allergy & Immunology  [facility] office: [phone] [facility] office: [place] office: [phone]

## 2019-10-19 NOTE — Progress Notes (Deleted)
Follow Up Note  RE: [name] MRN: [id number] DOB: 26-Feb-1966 Date of Office Visit: 10/19/2019  Referring provider: [name],* Primary care provider: [name], FNP  Chief Complaint: No chief complaint on file.  History of Present Illness: I had the pleasure of seeing Brittany [name] for a follow up visit at the [facility] [place] on 10/19/2019. She is a 54 y.o. female, who is being followed for CIU, allergic rhino conjunctivitis. Her previous allergy office visit was on 07/18/2019 with Dr. Maudie Mercury. Today is a regular follow up visit.  Chronic idiopathic urticaria Past history - 2020 blood work was unremarkable for CBC diff, alpha gal, ANA, thyroid, CU index, H. pylori test, tryptase.  ESR was slightly elevated. Interim history - had a major flare a day before she was due for Xolair in March. Zyrtec is less drowsy than Xyzal.   Keep track of hives and when they are occurring. ? If you have another flare like the last time then call our office or send me a mychart message.   Change Xolair injections 323m to every 3.5 weeks from 4 weeks.   Continue zyrtec to 268m(2 pills) twice a day.  Continue famotidine 2081mwice a day.  May use hydroxyzine 29m29mery 8 hours for breakthrough symptoms.   Avoid the following potential triggers: alcohol, tight clothing, NSAIDs, heat.  Seasonal and perennial allergic rhinoconjunctivitis Past history - 2020 skin testing was positive to ragweed, weed, dust mite. Interim history - symptoms stable, sore healed. Stopped using Flonase. Singulair helpful.   Stop Flonase for now.   May use pataday 1 drop in each as daily as needed for itchy/watery eyes.  Continue environmental control measures.  Continue antihistamines as above.  Continue Singulair (montelukast) 10 mg daily at night.  Return in about 3 months (around 10/17/2019).   Assessment and Plan: Brittany [name] is a 53 y47. female with: No problem-specific  Assessment & Plan notes found for this encounter.  No follow-ups on file.  No orders of the defined types were placed in this encounter.  Lab Orders  No laboratory test(s) ordered today    Diagnostics: Spirometry:  Tracings reviewed. Her effort: {Blank single:19197::"Good reproducible efforts.","It was hard to get consistent efforts and there is a question as to whether this reflects a maximal maneuver.","Poor effort, data can not be interpreted."} FVC: ***L FEV1: ***L, ***% predicted FEV1/FVC ratio: ***% Interpretation: {Blank single:19197::"Spirometry consistent with mild obstructive disease","Spirometry consistent with moderate obstructive disease","Spirometry consistent with severe obstructive disease","Spirometry consistent with possible restrictive disease","Spirometry consistent with mixed obstructive and restrictive disease","Spirometry uninterpretable due to technique","Spirometry consistent with normal pattern","No overt abnormalities noted given today's efforts"}.  Please see scanned spirometry results for details.  Skin Testing: {Blank single:19197::"Select foods","Environmental allergy panel","Environmental allergy panel and select foods","Food allergy panel","None","Deferred due to recent antihistamines use"}. Positive test to: ***. Negative test to: ***.  Results discussed with patient/family.   Medication List:  Current Outpatient Medications  Medication Sig Dispense Refill  . acetaminophen (TYLENOL 8 HOUR ARTHRITIS PAIN) 650 MG CR tablet Take 2,600 mg by mouth once.    . calcium-vitamin D (OSCAL WITH D) 500-200 MG-UNIT tablet Take 3 tablets by mouth every morning. For bone health (Patient taking differently: Take 3 tablets by mouth every evening. For bone health)    . cetirizine (ZYRTEC) 10 MG tablet Take 20 mg by mouth 2 (two) times daily.    . cholecalciferol (VITAMIN D) 1000 units tablet Take 1 tablet (1,000 Units total)  by mouth daily. For bone health    .  DULoxetine (CYMBALTA) 60 MG capsule Take 1 capsule (60 mg total) by mouth 2 (two) times daily. 180 capsule 0  . EPINEPHrine 0.3 mg/0.3 mL IJ SOAJ injection Inject 0.3 mLs (0.3 mg total) into the muscle as needed for anaphylaxis. 1 each 1  . famotidine (PEPCID) 20 MG tablet Take 1 tablet (20 mg total) by mouth 2 (two) times daily as needed for heartburn or indigestion. 60 tablet 5  . fluticasone (FLONASE) 50 MCG/ACT nasal spray SHAKE LIQUID AND USE 2 SPRAYS IN EACH NOSTRIL DAILY AS NEEDED FOR ALLERGIES OR RHINITIS (Patient not taking: Reported on 07/18/2019) 16 g 0  . hydrOXYzine (ATARAX/VISTARIL) 25 MG tablet TAKE 1 TABLET BY MOUTH THREE TIMES DAILY AS NEEDED 90 tablet 0  . LORazepam (ATIVAN) 0.5 MG tablet TAKE 1 TABLET(0.5 MG) BY MOUTH DAILY AS NEEDED FOR ANXIETY 20 tablet 0  . montelukast (SINGULAIR) 10 MG tablet TAKE 1 TABLET(10 MG) BY MOUTH AT BEDTIME 30 tablet 5  . Multiple Vitamin (MULTI-VITAMIN) tablet Take by mouth.    Marland Kitchen omalizumab (XOLAIR) 150 MG injection Inject 300 mg into the skin every 14 (fourteen) days. 4 each 11  . pantoprazole (PROTONIX) 40 MG tablet TAKE 1 TABLET BY MOUTH TWICE DAILY 60 tablet 0  . PATADAY 0.2 % SOLN Place 1 drop into both eyes daily as needed. 2.5 mL 5  . QUEtiapine (SEROQUEL) 50 MG tablet TAKE 3 TABLETS(150 MG) BY MOUTH AT BEDTIME 90 tablet 2  . vitamin B-12 (CYANOCOBALAMIN) 500 MCG tablet Take 1 tablet (500 mcg total) by mouth daily. For Vitamin B-12 replacement    . vitamin E 1000 UNIT capsule Take 1,000 Units by mouth daily.     Current Facility-Administered Medications  Medication Dose Route Frequency Provider Last Rate Last Admin  . omalizumab Arvid Right) injection 300 mg  300 mg Subcutaneous Q28 days Garnet Sierras, DO   300 mg at 09/23/19 1129   Allergies: Allergies  Allergen Reactions  . Contrast Media [Iodinated Diagnostic Agents] Hives  . Amoxicillin Hives    .Marland KitchenHas patient had a PCN reaction causing immediate rash, facial/tongue/throat swelling, SOB or  lightheadedness with hypotension: Yes Has patient had a PCN reaction causing severe rash involving mucus membranes or skin necrosis: No Has patient had a PCN reaction that required hospitalization No Has patient had a PCN reaction occurring within the last 10 years: No If all of the above answers are "NO", then may proceed with Cephalosporin use.   . Bactrim [Sulfamethoxazole-Trimethoprim] Hives and Rash  . Lamictal [Lamotrigine] Hives and Rash   I reviewed her past medical history, social history, family history, and environmental history and no significant changes have been reported from her previous visit.  Review of Systems  Constitutional: Negative for appetite change, chills, fever and unexpected weight change.  HENT: Negative for congestion and rhinorrhea.   Eyes: Negative for itching.  Respiratory: Negative for cough, chest tightness, shortness of breath and wheezing.   Gastrointestinal: Negative for abdominal pain.  Skin: Negative for rash.  Allergic/Immunologic: Positive for environmental allergies.  Neurological: Negative for headaches.   Objective: There were no vitals taken for this visit. There is no height or weight on file to calculate BMI. Physical Exam Vitals and nursing note reviewed.  Constitutional:      Appearance: She is well-developed.  HENT:     Head: Normocephalic and atraumatic.     Right Ear: External ear normal.     Left Ear:  External ear normal.     Nose: Nose normal.  Eyes:     Conjunctiva/sclera: Conjunctivae normal.  Cardiovascular:     Rate and Rhythm: Normal rate and regular rhythm.     Heart sounds: Normal heart sounds. No murmur heard.  No friction rub. No gallop.   Pulmonary:     Effort: Pulmonary effort is normal.     Breath sounds: Normal breath sounds. No wheezing or rales.  Musculoskeletal:     Cervical back: Neck supple.  Skin:    General: Skin is warm.     Findings: No rash.  Neurological:     Mental Status: She is alert and  oriented to person, place, and time.  Psychiatric:        Behavior: Behavior normal.    Previous notes and tests were reviewed. The plan was reviewed with the patient/family, and all questions/concerned were addressed.  It was my pleasure to see Rennae today and participate in her care. Please feel free to contact me with any questions or concerns.  Sincerely,  Rexene Alberts, DO Allergy & Immunology  [facility] office: [phone] [facility] office: [place] office: [phone]

## 2019-10-19 NOTE — Progress Notes (Addendum)
Follow Up Note  RE: Brittany [name] MRN: [id number] DOB: 1965-11-12 Date of Office Visit: 10/19/2019  Referring provider: [name],* Primary care provider: [name], [place]  Chief Complaint: Urticaria (improving since last visit, reapproval for Xolair)  History of Present Illness: I had the pleasure of seeing Brittany [name] for a follow up visit at the [facility] on 10/19/2019. She is a 54 y.o. female, who is being followed for chronic idiopathic urticaria and allergic rhinoconjunctivitis. Her previous allergy office visit was on 07/18/2019 with Dr. Maudie [name]. Today is a regular follow up visit.  Chronic Idiopathic Urticaria She reports being completely clear of her urticaria for the last six to seven weeks which she attributes to the increase in frequency of her Xolair injections. She has been adherent to her zyrtec 65m bid, Pepcid 249mbid, Singulair 1012mvery evening, and Xolair 300m15mjections every three and a half weeks, and reports that she never misses any doses. She reports no adverse effects to her medications. She purchases her Pepcid and zyrtec through [employer] means to cut costs on these medications. She is still unsure of any identifiable trigger for her urticaria, but thinks stress and heat may contribute to the occurrences.  Seasonal and Perennial Allergic Rhinoconjunctivitis She reports that her allergies have been doing great. She takes Singulair 10mg17mry evening, and never misses doses. She no longer takes Flonase due to nose bleeds, and has not been needing to use Pataday eye drops.  Assessment and Plan: Brittany [name] is a 53 y.38 female with: Seasonal and perennial allergic rhinoconjunctivitis Past history - 2020 skin testing was positive to ragweed, weed, dust mite. Interim history - Condition is stable. Denies symptoms of allergic rhinoconjunctivitis. Continue current regimen of montelukast and antihistamines.  May use pataday 1 drop in  each as daily as needed for itchy/watery eyes.  Continue environmental control measures.  Continue antihistamines as above.  Continue Singulair (montelukast) 10 mg daily at night.  Chronic idiopathic urticaria Past history - 2020 blood work was unremarkable for CBC diff, alpha gal, ANA, thyroid, CU index, H. pylori test, tryptase.  ESR was slightly elevated. Interim history - Has not had urticaria for nearly two months on more frequent dosing of xolair with continuation of prior regimen of antihistamines and montelukast.   Weaning schedule:  Take zyrtec 10mg 29mhe morning, 20mg i59me evening + famotidine 20mg tw29ma day. If no hives for 2-3 weeks:  Take zyrtec 10mg twi25m day + famotidine 20mg twic34mday. If no hives for 2-3 weeks:  Take zyrtec 10mg twice32may + famotidine 20mg once a8m. If no hives for 2-3 weeks:  Take zyrtec 10mg twice a30m and stop famotidine.    Keep track of hives and when they are occurring.  If you have another flare like the last time then call our office or send me a mychart message.   Continue with Xolair injections 300mg to every9m weeks.  May use hydroxyzine 25mg every 8 h69m for breakthrough symptoms.   Avoid the following potential triggers: alcohol, tight clothing, NSAIDs, heat.  Return in about 4 months (around 02/19/2020).  Medication List:  Current Outpatient Medications  Medication Sig Dispense Refill   acetaminophen (TYLENOL 8 HOUR ARTHRITIS PAIN) 650 MG CR tablet Take 2,600 mg by mouth once.     calcium-vitamin D (OSCAL WITH D) 500-200 MG-UNIT tablet Take 3 tablets by mouth every morning. For bone health (  Patient taking differently: Take 3 tablets by mouth every evening. For bone health)     cetirizine (ZYRTEC) 10 MG tablet Take 20 mg by mouth 2 (two) times daily.     cholecalciferol (VITAMIN D) 1000 units tablet Take 1 tablet (1,000 Units total) by mouth daily. For bone health     DULoxetine (CYMBALTA) 60 MG capsule  Take 1 capsule (60 mg total) by mouth 2 (two) times daily. 180 capsule 0   EPINEPHrine 0.3 mg/0.3 mL IJ SOAJ injection Inject 0.3 mLs (0.3 mg total) into the muscle as needed for anaphylaxis. 1 each 1   famotidine (PEPCID) 20 MG tablet Take 1 tablet (20 mg total) by mouth 2 (two) times daily as needed for heartburn or indigestion. 60 tablet 5   hydrOXYzine (ATARAX/VISTARIL) 25 MG tablet TAKE 1 TABLET BY MOUTH THREE TIMES DAILY AS NEEDED 90 tablet 0   LORazepam (ATIVAN) 0.5 MG tablet TAKE 1 TABLET(0.5 MG) BY MOUTH DAILY AS NEEDED FOR ANXIETY 20 tablet 0   montelukast (SINGULAIR) 10 MG tablet TAKE 1 TABLET(10 MG) BY MOUTH AT BEDTIME 30 tablet 5   Multiple Vitamin (MULTI-VITAMIN) tablet Take by mouth.     omalizumab (XOLAIR) 150 MG injection Inject 300 mg into the skin every 14 (fourteen) days. 4 each 11   pantoprazole (PROTONIX) 40 MG tablet TAKE 1 TABLET BY MOUTH TWICE DAILY 60 tablet 0   PATADAY 0.2 % SOLN Place 1 drop into both eyes daily as needed. 2.5 mL 5   QUEtiapine (SEROQUEL) 50 MG tablet TAKE 3 TABLETS(150 MG) BY MOUTH AT BEDTIME 90 tablet 2   vitamin B-12 (CYANOCOBALAMIN) 500 MCG tablet Take 1 tablet (500 mcg total) by mouth daily. For Vitamin B-12 replacement     vitamin E 1000 UNIT capsule Take 1,000 Units by mouth daily.     Current Facility-Administered Medications  Medication Dose Route Frequency Provider Last Rate Last Admin   omalizumab Arvid Right) injection 300 mg  300 mg Subcutaneous Q28 days Garnet Sierras, DO   300 mg at 09/23/19 1129   Allergies: Allergies  Allergen Reactions   Contrast Media [Iodinated Diagnostic Agents] Hives   Amoxicillin Hives    .Marland KitchenHas patient had a PCN reaction causing immediate rash, facial/tongue/throat swelling, SOB or lightheadedness with hypotension: Yes Has patient had a PCN reaction causing severe rash involving mucus membranes or skin necrosis: No Has patient had a PCN reaction that required hospitalization No Has patient had a PCN  reaction occurring within the last 10 years: No If all of the above answers are "NO", then may proceed with Cephalosporin use.    Bactrim [Sulfamethoxazole-Trimethoprim] Hives and Rash   Lamictal [Lamotrigine] Hives and Rash   I reviewed her past medical history, social history, family history, and environmental history and no significant changes have been reported from her previous visit.  Review of Systems  Constitutional: Negative for appetite change, chills, fatigue, fever and unexpected weight change.  HENT: Negative for congestion, rhinorrhea, sinus pressure and sneezing.   Eyes: Negative for pain and itching.  Respiratory: Negative for cough, chest tightness, shortness of breath and wheezing.   Gastrointestinal: Negative for abdominal pain.  Skin: Negative for rash.  Allergic/Immunologic: Positive for environmental allergies.  Neurological: Negative for headaches.   Objective: BP 122/88    Pulse 67    Resp 18    Ht 5' 9" (1.753 m)    Wt 270 lb 9.6 oz (122.7 kg)    SpO2 96%    BMI 39.96 kg/m  Body mass index is 39.96 kg/m. Physical Exam Vitals and nursing note reviewed.  Constitutional:      Appearance: Normal appearance. She is obese.  HENT:     Head: Normocephalic and atraumatic.     Right Ear: Tympanic membrane, ear canal and external ear normal.     Left Ear: Tympanic membrane, ear canal and external ear normal.     Nose: Nose normal.     Mouth/Throat:     Mouth: Mucous membranes are moist.     Pharynx: Oropharynx is clear.  Eyes:     Extraocular Movements: Extraocular movements intact.     Conjunctiva/sclera: Conjunctivae normal.     Pupils: Pupils are equal, round, and reactive to light.  Cardiovascular:     Rate and Rhythm: Normal rate and regular rhythm.     Heart sounds: Normal heart sounds. No murmur heard.  No gallop.   Pulmonary:     Effort: Pulmonary effort is normal.     Breath sounds: Normal breath sounds. No wheezing, rhonchi or rales.    Musculoskeletal:        General: No swelling or deformity. Normal range of motion.     Cervical back: Normal range of motion and neck supple.  Skin:    General: Skin is warm and dry.     Findings: No rash.  Neurological:     Mental Status: She is alert and oriented to person, place, and time.  Psychiatric:        Mood and Affect: Mood normal.        Behavior: Behavior normal.        Thought Content: Thought content normal.        Judgment: Judgment normal.    Previous notes and tests were reviewed. The plan was reviewed with the patient/family, and all questions/concerned were addressed.  It was my pleasure to see Farren today and participate in her care. Please feel free to contact me with any questions or concerns.  Sincerely,  Paulla Dolly, MD PGY-1 Internal Medicine  [facility] [place] [facility] office: [phone] [facility] office: [place] office: [phone]

## 2019-10-19 NOTE — Assessment & Plan Note (Addendum)
Past history - 2020 skin testing was positive to ragweed, weed, dust mite. Interim history - Condition is stable. Denies symptoms of allergic rhinoconjunctivitis. Continue current regimen of montelukast and antihistamines.  May use pataday 1 drop in each as daily as needed for itchy/watery eyes.  Continue environmental control measures.  Continue antihistamines as above.  Continue Singulair (montelukast) 10 mg daily at night.

## 2019-10-19 NOTE — Patient Instructions (Addendum)
Chronic idiopathic urticaria Weaning schedule:  Take zyrtec 10mg  in the morning, 20mg  in the evening + famotidine 20mg  twice a day. If no hives for 2-3 weeks:  Take zyrtec 10mg  twice a day + famotidine 20mg  twice a day. If no hives for 2-3 weeks:  Take zyrtec 10mg  twice a day + famotidine 20mg  once a day. If no hives for 2-3 weeks:  Take zyrtec 10mg  twice a day and stop famotidine.    Keep track of hives and when they are occurring.  If you have another flare like the last time then call our office or send me a mychart message.   Continue with Xolair injections 300mg  to every 3.5 weeks.  May use hydroxyzine 25mg  every 8 hours for breakthrough symptoms.   Avoid the following potential triggers: alcohol, tight clothing, NSAIDs, heat.  Seasonal and perennial allergic rhinoconjunctivitis Past history - 2020 skin testing was positive to ragweed, weed, dust mite.  May use pataday 1 drop in each as daily as needed for itchy/watery eyes.  Continue environmental control measures.  Continue antihistamines as above.  Continue Singulair (montelukast) 10 mg daily at night.   Follow up in 4 months or sooner if needed.   Reducing Pollen Exposure  Pollen seasons: trees (spring), grass (summer) and ragweed/weeds (fall).  Keep windows closed in your home and car to lower pollen exposure.   Install air conditioning in the bedroom and throughout the house if possible.   Avoid going out in dry windy days - especially early morning.  Pollen counts are highest between 5 - 10 AM and on dry, hot and windy days.   Save outside activities for late afternoon or after a heavy rain, when pollen levels are lower.   Avoid mowing of grass if you have grass pollen allergy.  Be aware that pollen can also be transported indoors on people and pets.   Dry your clothes in an automatic dryer rather than hanging them outside where they might collect pollen.   Rinse hair and eyes before  bedtime.  Control of House Dust Mite Allergen  Dust mite allergens are a common trigger of allergy and asthma symptoms. While they can be found throughout the house, these microscopic creatures thrive in warm, humid environments such as bedding, upholstered furniture and carpeting.  Because so much time is spent in the bedroom, it is essential to reduce mite levels there.   Encase pillows, mattresses, and box springs in special allergen-proof fabric covers or airtight, zippered plastic covers.   Bedding should be washed weekly in hot water (130 F) and dried in a hot dryer. Allergen-proof covers are available for comforters and pillows that cant be regularly washed.   Wash the allergy-proof covers every few months. Minimize clutter in the bedroom. Keep pets out of the bedroom.   Keep humidity less than 50% by using a dehumidifier or air conditioning. You can buy a humidity measuring device called a hygrometer to monitor this.   If possible, replace carpets with hardwood, linoleum, or washable area rugs. If that's not possible, vacuum frequently with a vacuum that has a HEPA filter.  Remove all upholstered furniture and non-washable window drapes from the bedroom.  Remove all non-washable stuffed toys from the bedroom.  Wash stuffed toys weekly.

## 2019-10-21 ENCOUNTER — Other Ambulatory Visit: Payer: Self-pay

## 2019-10-21 ENCOUNTER — Ambulatory Visit (INDEPENDENT_AMBULATORY_CARE_PROVIDER_SITE_OTHER): Payer: 59

## 2019-10-21 DIAGNOSIS — L501 Idiopathic urticaria: Secondary | ICD-10-CM | POA: Diagnosis not present

## 2019-10-24 ENCOUNTER — Ambulatory Visit (INDEPENDENT_AMBULATORY_CARE_PROVIDER_SITE_OTHER): Payer: 59 | Admitting: Psychology

## 2019-10-24 DIAGNOSIS — F331 Major depressive disorder, recurrent, moderate: Secondary | ICD-10-CM | POA: Diagnosis not present

## 2019-10-24 DIAGNOSIS — F411 Generalized anxiety disorder: Secondary | ICD-10-CM

## 2019-11-14 ENCOUNTER — Ambulatory Visit (INDEPENDENT_AMBULATORY_CARE_PROVIDER_SITE_OTHER): Payer: 59 | Admitting: Psychology

## 2019-11-14 DIAGNOSIS — F411 Generalized anxiety disorder: Secondary | ICD-10-CM | POA: Diagnosis not present

## 2019-11-14 DIAGNOSIS — F331 Major depressive disorder, recurrent, moderate: Secondary | ICD-10-CM | POA: Diagnosis not present

## 2019-11-18 ENCOUNTER — Ambulatory Visit: Payer: Self-pay

## 2019-11-24 ENCOUNTER — Other Ambulatory Visit: Payer: Self-pay

## 2019-11-24 ENCOUNTER — Encounter (HOSPITAL_COMMUNITY): Payer: Self-pay | Admitting: Psychiatry

## 2019-11-24 ENCOUNTER — Telehealth (INDEPENDENT_AMBULATORY_CARE_PROVIDER_SITE_OTHER): Payer: 59 | Admitting: Psychiatry

## 2019-11-24 DIAGNOSIS — F4310 Post-traumatic stress disorder, unspecified: Secondary | ICD-10-CM

## 2019-11-24 DIAGNOSIS — F410 Panic disorder [episodic paroxysmal anxiety] without agoraphobia: Secondary | ICD-10-CM

## 2019-11-24 DIAGNOSIS — F3341 Major depressive disorder, recurrent, in partial remission: Secondary | ICD-10-CM | POA: Diagnosis not present

## 2019-11-24 MED ORDER — DULOXETINE HCL 60 MG PO CPEP: 60.0000 mg | ORAL_CAPSULE | Freq: Two times a day (BID) | ORAL | 0 refills | Status: DC

## 2019-11-24 MED ORDER — QUETIAPINE FUMARATE 50 MG PO TABS: ORAL_TABLET | ORAL | 2 refills | Status: DC
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
## 2019-11-28 ENCOUNTER — Ambulatory Visit (INDEPENDENT_AMBULATORY_CARE_PROVIDER_SITE_OTHER): Payer: 59

## 2019-11-28 ENCOUNTER — Other Ambulatory Visit: Payer: Self-pay

## 2019-11-28 DIAGNOSIS — L501 Idiopathic urticaria: Secondary | ICD-10-CM | POA: Diagnosis not present

## 2019-12-06 ENCOUNTER — Encounter: Payer: Self-pay | Admitting: Allergy

## 2019-12-12 ENCOUNTER — Ambulatory Visit (INDEPENDENT_AMBULATORY_CARE_PROVIDER_SITE_OTHER): Payer: 59 | Admitting: Psychology

## 2019-12-12 DIAGNOSIS — F331 Major depressive disorder, recurrent, moderate: Secondary | ICD-10-CM

## 2019-12-12 DIAGNOSIS — F411 Generalized anxiety disorder: Secondary | ICD-10-CM | POA: Diagnosis not present

## 2019-12-26 ENCOUNTER — Ambulatory Visit (INDEPENDENT_AMBULATORY_CARE_PROVIDER_SITE_OTHER): Payer: 59

## 2019-12-26 ENCOUNTER — Other Ambulatory Visit: Payer: Self-pay

## 2019-12-26 DIAGNOSIS — L501 Idiopathic urticaria: Secondary | ICD-10-CM | POA: Diagnosis not present

## 2020-01-02 ENCOUNTER — Ambulatory Visit (INDEPENDENT_AMBULATORY_CARE_PROVIDER_SITE_OTHER): Payer: 59 | Admitting: Psychology

## 2020-01-02 DIAGNOSIS — F331 Major depressive disorder, recurrent, moderate: Secondary | ICD-10-CM

## 2020-01-02 DIAGNOSIS — F411 Generalized anxiety disorder: Secondary | ICD-10-CM | POA: Diagnosis not present

## 2020-01-23 ENCOUNTER — Ambulatory Visit: Payer: Self-pay

## 2020-01-25 ENCOUNTER — Ambulatory Visit (INDEPENDENT_AMBULATORY_CARE_PROVIDER_SITE_OTHER): Payer: 59 | Admitting: Psychology

## 2020-01-25 DIAGNOSIS — F331 Major depressive disorder, recurrent, moderate: Secondary | ICD-10-CM | POA: Diagnosis not present

## 2020-01-25 DIAGNOSIS — F411 Generalized anxiety disorder: Secondary | ICD-10-CM | POA: Diagnosis not present

## 2020-01-26 ENCOUNTER — Other Ambulatory Visit: Payer: Self-pay

## 2020-01-26 ENCOUNTER — Ambulatory Visit (INDEPENDENT_AMBULATORY_CARE_PROVIDER_SITE_OTHER): Payer: 59

## 2020-01-26 DIAGNOSIS — L501 Idiopathic urticaria: Secondary | ICD-10-CM | POA: Diagnosis not present

## 2020-02-15 ENCOUNTER — Ambulatory Visit: Payer: 59 | Admitting: Psychology

## 2020-02-23 ENCOUNTER — Ambulatory Visit (INDEPENDENT_AMBULATORY_CARE_PROVIDER_SITE_OTHER): Payer: 59

## 2020-02-23 ENCOUNTER — Other Ambulatory Visit: Payer: Self-pay

## 2020-02-23 DIAGNOSIS — L501 Idiopathic urticaria: Secondary | ICD-10-CM | POA: Diagnosis not present

## 2020-02-27 ENCOUNTER — Other Ambulatory Visit: Payer: Self-pay | Admitting: Obstetrics and Gynecology

## 2020-02-27 ENCOUNTER — Telehealth (INDEPENDENT_AMBULATORY_CARE_PROVIDER_SITE_OTHER): Payer: 59 | Admitting: Psychiatry

## 2020-02-27 ENCOUNTER — Other Ambulatory Visit: Payer: Self-pay

## 2020-02-27 ENCOUNTER — Ambulatory Visit: Payer: 59 | Admitting: Allergy

## 2020-02-27 ENCOUNTER — Encounter (HOSPITAL_COMMUNITY): Payer: Self-pay | Admitting: Psychiatry

## 2020-02-27 DIAGNOSIS — F3341 Major depressive disorder, recurrent, in partial remission: Secondary | ICD-10-CM

## 2020-02-27 DIAGNOSIS — F410 Panic disorder [episodic paroxysmal anxiety] without agoraphobia: Secondary | ICD-10-CM

## 2020-02-27 DIAGNOSIS — F4310 Post-traumatic stress disorder, unspecified: Secondary | ICD-10-CM

## 2020-02-27 DIAGNOSIS — Z1231 Encounter for screening mammogram for malignant neoplasm of breast: Secondary | ICD-10-CM

## 2020-02-27 MED ORDER — QUETIAPINE FUMARATE 50 MG PO TABS: ORAL_TABLET | ORAL | 2 refills | Status: DC

## 2020-02-27 MED ORDER — DULOXETINE HCL 60 MG PO CPEP: 60.0000 mg | ORAL_CAPSULE | Freq: Two times a day (BID) | ORAL | 0 refills | Status: DC
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
## 2020-02-28 ENCOUNTER — Other Ambulatory Visit: Payer: Self-pay

## 2020-02-28 ENCOUNTER — Telehealth: Payer: Self-pay | Admitting: *Deleted

## 2020-02-28 ENCOUNTER — Ambulatory Visit (INDEPENDENT_AMBULATORY_CARE_PROVIDER_SITE_OTHER): Payer: 59 | Admitting: Obstetrics and Gynecology

## 2020-02-28 ENCOUNTER — Ambulatory Visit (INDEPENDENT_AMBULATORY_CARE_PROVIDER_SITE_OTHER): Payer: 59 | Admitting: Psychology

## 2020-02-28 ENCOUNTER — Encounter: Payer: Self-pay | Admitting: Obstetrics and Gynecology

## 2020-02-28 VITALS — BP 124/80

## 2020-02-28 DIAGNOSIS — F331 Major depressive disorder, recurrent, moderate: Secondary | ICD-10-CM

## 2020-02-28 DIAGNOSIS — F411 Generalized anxiety disorder: Secondary | ICD-10-CM | POA: Diagnosis not present

## 2020-02-28 DIAGNOSIS — N6320 Unspecified lump in the left breast, unspecified quadrant: Secondary | ICD-10-CM

## 2020-02-28 DIAGNOSIS — R922 Inconclusive mammogram: Secondary | ICD-10-CM | POA: Diagnosis not present

## 2020-02-28 DIAGNOSIS — R9230 Dense breasts, unspecified: Secondary | ICD-10-CM

## 2020-02-28 NOTE — Progress Notes (Signed)
 Follow Up Note  RE: Brittany [name] MRN: [id number] DOB: 12/16/1965 Date of Office Visit: 02/29/2020  Referring provider: Murray, Brittany [name],* Primary care provider: Murray, Brittany Woodruff, FNP  Chief Complaint: Follow-up and Urticaria  History of Present Illness: I had the pleasure of seeing Brittany [name] for a follow up visit at the [facility] on 02/29/2020. She is a 54 y.o. female, who is being followed for allergic rhinoconjunctivitis, chronic idiopathic urticaria. Her previous allergy office visit was on 10/19/2019 with Dr. . Today is a regular follow up visit.  Chronic idiopathic urticaria No major breakouts since the last visit. A few episodes of a tiny hive which lasts for a few hours at a time. Tried to wean down her medications but had breakthrough hives.  Currently taking zyrtec 20mg BID, famotidine 20mg BID, montelukast at night and Xolair 300mg every 3.5 weeks.  She is still working from home and sometimes this has been stressful at times which seems to flare her hives.   Seasonal and perennial allergic rhinoconjunctivitis Stable and sometimes symptoms flare when she is helping her mother clean her older home. Using eye drops and Flonase as needed with good benefit.   Assessment and Plan: Brittany [name] is a 54 y.o. female with: Chronic idiopathic urticaria Past history - 2020 blood work was unremarkable for CBC diff, alpha gal, ANA, thyroid, CU index, H. pylori test, tryptase.  ESR was slightly elevated. Triggers include stress. Interim history - Unable to wean off antihistamines as it caused breakthrough hives.  Continue with zyrtec 20mg in the morning and 20mg in the evening.  Continue with famotidine 20mg twice a day.  May try wean using below schedule if hives under controlled.  Weaning schedule:  Take zyrtec 10mg in the morning, 20mg in the evening + famotidine 20mg twice a day. If no hives for 2-3 weeks:  Take zyrtec 10mg twice a day +  famotidine 20mg twice a day. If no hives for 2-3 weeks:  Take zyrtec 10mg twice a day + famotidine 20mg once a day. If no hives for 2-3 weeks:  Take zyrtec 10mg twice a day and stop famotidine.   Keep track of hives and when they are occurring.  If you have another flare like the last time then call our office or send me a mychart message.   Continue with Xolair injections 300mg every 3.5 weeks.  May use hydroxyzine 25mg every 8 hours for breakthrough symptoms.   Avoid the following potential triggers: alcohol, tight clothing, NSAIDs, heat.  Seasonal and perennial allergic rhinoconjunctivitis Past history - 2020 skin testing was positive to ragweed, weed, dust mite. Interim history - stable with below regimen.  Continue environmental control measures.  Continue antihistamines as above.  Continue Singulair (montelukast) 10 mg daily at night.   May use pataday 1 drop in each as daily as needed for itchy/watery eyes.  May use Flonase (fluticasone) nasal spray 1 spray per nostril twice a day as needed for nasal congestion.   Nasal saline spray (i.e., Simply Saline) or nasal saline lavage (i.e., NeilMed) is recommended as needed and prior to medicated nasal sprays.  Return in about 4 months (around 06/28/2020).  Diagnostics: None.   Medication List:  Current Outpatient Medications  Medication Sig Dispense Refill  . acetaminophen (TYLENOL 8 HOUR ARTHRITIS PAIN) 650 MG CR tablet Take 2,600 mg by mouth once.    . calcium-vitamin D (OSCAL WITH D) 500-200 MG-UNIT tablet Take 3 tablets by mouth every morning.   For bone health (Patient taking differently: Take 3 tablets by mouth every evening. For bone health)    . cetirizine (ZYRTEC) 10 MG tablet Take 20 mg by mouth 2 (two) times daily.    . cholecalciferol (VITAMIN D) 1000 units tablet Take 1 tablet (1,000 Units total) by mouth daily. For bone health    . DULoxetine (CYMBALTA) 60 MG capsule Take 1 capsule (60 mg total) by mouth 2  (two) times daily. 180 capsule 0  . EPINEPHrine 0.3 mg/0.3 mL IJ SOAJ injection Inject 0.3 mLs (0.3 mg total) into the muscle as needed for anaphylaxis. 1 each 1  . famotidine (PEPCID) 20 MG tablet Take 1 tablet (20 mg total) by mouth 2 (two) times daily as needed for heartburn or indigestion. 60 tablet 5  . hydrOXYzine (ATARAX/VISTARIL) 25 MG tablet TAKE 1 TABLET BY MOUTH THREE TIMES DAILY AS NEEDED 90 tablet 0  . LORazepam (ATIVAN) 0.5 MG tablet TAKE 1 TABLET(0.5 MG) BY MOUTH DAILY AS NEEDED FOR ANXIETY 20 tablet 0  . montelukast (SINGULAIR) 10 MG tablet TAKE 1 TABLET(10 MG) BY MOUTH AT BEDTIME 30 tablet 5  . Multiple Vitamin (MULTI-VITAMIN) tablet Take by mouth.    . omalizumab (XOLAIR) 150 MG injection Inject 300 mg into the skin every 14 (fourteen) days. 4 each 11  . pantoprazole (PROTONIX) 40 MG tablet TAKE 1 TABLET BY MOUTH TWICE DAILY 60 tablet 0  . PATADAY 0.2 % SOLN Place 1 drop into both eyes daily as needed. 2.5 mL 5  . QUEtiapine (SEROQUEL) 50 MG tablet TAKE 3 TABLETS(150 MG) BY MOUTH AT BEDTIME 90 tablet 2  . vitamin B-12 (CYANOCOBALAMIN) 500 MCG tablet Take 1 tablet (500 mcg total) by mouth daily. For Vitamin B-12 replacement    . vitamin E 1000 UNIT capsule Take 1,000 Units by mouth daily.     Current Facility-Administered Medications  Medication Dose Route Frequency Provider Last Rate Last Admin  . omalizumab (XOLAIR) injection 300 mg  300 mg Subcutaneous Q28 days ,  M, DO   300 mg at 02/23/20 1028   Allergies: Allergies  Allergen Reactions  . Contrast Media [Iodinated Diagnostic Agents] Hives  . Amoxicillin Hives    ..Has patient had a PCN reaction causing immediate rash, facial/tongue/throat swelling, SOB or lightheadedness with hypotension: Yes Has patient had a PCN reaction causing severe rash involving mucus membranes or skin necrosis: No Has patient had a PCN reaction that required hospitalization No Has patient had a PCN reaction occurring within the last 10  years: No If all of the above answers are "NO", then may proceed with Cephalosporin use.   . Bactrim [Sulfamethoxazole-Trimethoprim] Hives and Rash  . Lamictal [Lamotrigine] Hives and Rash   I reviewed her past medical history, social history, family history, and environmental history and no significant changes have been reported from her previous visit.  Review of Systems  Constitutional: Negative for appetite change, chills, fatigue, fever and unexpected weight change.  HENT: Negative for congestion, rhinorrhea, sinus pressure and sneezing.   Eyes: Negative for pain and itching.  Respiratory: Negative for cough, chest tightness, shortness of breath and wheezing.   Gastrointestinal: Negative for abdominal pain.  Skin: Negative for rash.  Allergic/Immunologic: Positive for environmental allergies.  Neurological: Negative for headaches.   Objective: BP 126/80 (BP Location: Left Arm, Patient Position: Sitting, Cuff Size: Normal)   Pulse 64   Temp 97.9 F (36.6 C) (Temporal)   SpO2 97%  There is no height or weight on file to calculate   BMI. Physical Exam Vitals and nursing note reviewed.  Constitutional:      Appearance: Normal appearance.  HENT:     Head: Normocephalic and atraumatic.     Right Ear: Tympanic membrane, ear canal and external ear normal.     Left Ear: Tympanic membrane, ear canal and external ear normal.     Nose: Nose normal.     Mouth/Throat:     Mouth: Mucous membranes are moist.     Pharynx: Oropharynx is clear.  Eyes:     Extraocular Movements: Extraocular movements intact.     Conjunctiva/sclera: Conjunctivae normal.     Pupils: Pupils are equal, round, and reactive to light.  Cardiovascular:     Rate and Rhythm: Normal rate and regular rhythm.     Heart sounds: Normal heart sounds. No murmur heard.  No gallop.   Pulmonary:     Effort: Pulmonary effort is normal.     Breath sounds: Normal breath sounds. No wheezing, rhonchi or rales.   Musculoskeletal:        General: No swelling or deformity. Normal range of motion.     Cervical back: Normal range of motion and neck supple.  Skin:    General: Skin is warm and dry.     Findings: No rash.  Neurological:     Mental Status: She is alert and oriented to person, place, and time.  Psychiatric:        Mood and Affect: Mood normal.        Behavior: Behavior normal.        Thought Content: Thought content normal.        Judgment: Judgment normal.    Previous notes and tests were reviewed. The plan was reviewed with the patient/family, and all questions/concerned were addressed.  It was my pleasure to see Courtenay today and participate in her care. Please feel free to contact me with any questions or concerns.  Sincerely,  Rexene Alberts, DO Allergy & Immunology  [facility] office: [place] office: [phone]

## 2020-02-28 NOTE — Progress Notes (Signed)
Brittany [name] 05-16-1965 [id number]  SUBJECTIVE:  54 y.o. G0P0000 female presents for evaluation of left breast lump. Last seen in this office 10/2017.  Last mammogram 02/2019 was normal/BI-RADS 1. No spontaneous pain, slightly sore when rubbing over the area.  She noticed this lump about 2 months ago.  She indicates family history of breast cancer in her aunt diagnosed in her 47s.  No known genetic testing in the family for BRCA.  Patient says she therefore has been getting screening mammograms since age 51.  No nipple discharge or drainage.  No skin changes.  She indicates history of occasional callbacks for diagnostic imaging of left breast density in the past.   Current Outpatient Medications  Medication Sig Dispense Refill  . acetaminophen (TYLENOL 8 HOUR ARTHRITIS PAIN) 650 MG CR tablet Take 2,600 mg by mouth once.    . calcium-vitamin D (OSCAL WITH D) 500-200 MG-UNIT tablet Take 3 tablets by mouth every morning. For bone health (Patient taking differently: Take 3 tablets by mouth every evening. For bone health)    . cetirizine (ZYRTEC) 10 MG tablet Take 20 mg by mouth 2 (two) times daily.    . cholecalciferol (VITAMIN D) 1000 units tablet Take 1 tablet (1,000 Units total) by mouth daily. For bone health    . DULoxetine (CYMBALTA) 60 MG capsule Take 1 capsule (60 mg total) by mouth 2 (two) times daily. 180 capsule 0  . EPINEPHrine 0.3 mg/0.3 mL IJ SOAJ injection Inject 0.3 mLs (0.3 mg total) into the muscle as needed for anaphylaxis. 1 each 1  . famotidine (PEPCID) 20 MG tablet Take 1 tablet (20 mg total) by mouth 2 (two) times daily as needed for heartburn or indigestion. 60 tablet 5  . hydrOXYzine (ATARAX/VISTARIL) 25 MG tablet TAKE 1 TABLET BY MOUTH THREE TIMES DAILY AS NEEDED 90 tablet 0  . montelukast (SINGULAIR) 10 MG tablet TAKE 1 TABLET(10 MG) BY MOUTH AT BEDTIME 30 tablet 5  . Multiple Vitamin (MULTI-VITAMIN) tablet Take by mouth.    Marland Kitchen omalizumab (XOLAIR) 150 MG injection Inject  300 mg into the skin every 14 (fourteen) days. 4 each 11  . pantoprazole (PROTONIX) 40 MG tablet TAKE 1 TABLET BY MOUTH TWICE DAILY 60 tablet 0  . PATADAY 0.2 % SOLN Place 1 drop into both eyes daily as needed. 2.5 mL 5  . QUEtiapine (SEROQUEL) 50 MG tablet TAKE 3 TABLETS(150 MG) BY MOUTH AT BEDTIME 90 tablet 2  . vitamin B-12 (CYANOCOBALAMIN) 500 MCG tablet Take 1 tablet (500 mcg total) by mouth daily. For Vitamin B-12 replacement    . vitamin E 1000 UNIT capsule Take 1,000 Units by mouth daily.    Marland Kitchen LORazepam (ATIVAN) 0.5 MG tablet TAKE 1 TABLET(0.5 MG) BY MOUTH DAILY AS NEEDED FOR ANXIETY (Patient not taking: Reported on 02/27/2020) 20 tablet 0   Current Facility-Administered Medications  Medication Dose Route Frequency Provider Last Rate Last Admin  . omalizumab Arvid Right) injection 300 mg  300 mg Subcutaneous Q28 days Garnet Sierras, DO   300 mg at 02/23/20 1028   Allergies: Contrast media [iodinated diagnostic agents], Amoxicillin, Bactrim [sulfamethoxazole-trimethoprim], and Lamictal [lamotrigine]  No LMP recorded. Patient has had a hysterectomy.  Past medical history,surgical history, problem list, medications, allergies, family history and social history were all reviewed and documented as reviewed in the EPIC chart.  ROS: Positives and negatives as reviewed above in HPI  OBJECTIVE:  BP 124/80 (Cuff Size: Large)  The patient appears well, alert, oriented, in no distress.  BREAST EXAM: left breast normal without mass, skin or nipple changes or axillary nodes Ridge of tissue deep near chest wall noted about 6 cm off nipple at 3:00, slight tenderness here  Chaperone: [name] present during the examination  ASSESSMENT:  54 y.o. G0P0000 here for evaluation of left breast density  PLAN:  Patient has an upcoming screening mammogram.  I will have our staff help arrange a diagnostic mammogram of the left breast to evaluate this area of concern.  It very well could be just palpable  density since she indicates a history of that on imaging.   Joseph Pierini MD 02/28/20

## 2020-02-28 NOTE — Telephone Encounter (Signed)
Orders placed at the breast center patient scheduled 03/29/20 @ 11:00am this is next available slot for imaging. Patient informed with time and date.

## 2020-02-28 NOTE — Telephone Encounter (Signed)
-----   Message from Joseph Pierini, MD sent at 02/28/2020  8:32 AM EST ----- Regarding: diagnostic mammogram - left breast Please help patient schedule this with her upcoming screening mammogram [facility] of tissue she noted about 6 cm off nipple at 3:00 Thank you

## 2020-02-29 ENCOUNTER — Ambulatory Visit (INDEPENDENT_AMBULATORY_CARE_PROVIDER_SITE_OTHER): Payer: 59 | Admitting: Allergy

## 2020-02-29 ENCOUNTER — Encounter: Payer: Self-pay | Admitting: Allergy

## 2020-02-29 VITALS — BP 126/80 | HR 64 | Temp 97.9°F

## 2020-02-29 DIAGNOSIS — H1010 Acute atopic conjunctivitis, unspecified eye: Secondary | ICD-10-CM

## 2020-02-29 DIAGNOSIS — L501 Idiopathic urticaria: Secondary | ICD-10-CM | POA: Diagnosis not present

## 2020-02-29 DIAGNOSIS — J3089 Other allergic rhinitis: Secondary | ICD-10-CM

## 2020-02-29 DIAGNOSIS — J302 Other seasonal allergic rhinitis: Secondary | ICD-10-CM

## 2020-02-29 NOTE — Patient Instructions (Addendum)
Chronic idiopathic urticaria  Continue with zyrtec 20mg  in the morning and 20mg  in the evening.  Continue with famotidine 20mg  twice a day.   If you have no breakthrough hives then try to wean down the medications as below.   Weaning schedule:  Take zyrtec 10mg  in the morning, 20mg  in the evening + famotidine 20mg  twice a day. If no hives for 2-3 weeks:  Take zyrtec 10mg  twice a day + famotidine 20mg  twice a day. If no hives for 2-3 weeks:  Take zyrtec 10mg  twice a day + famotidine 20mg  once a day. If no hives for 2-3 weeks:  Take zyrtec 10mg  twice a day and stop famotidine.    Keep track of hives and when they are occurring.  If you have another flare like the last time then call our office or send me a mychart message.   Continue with Xolair injections 300mg  every 3.5 weeks.  May use hydroxyzine 25mg  every 8 hours for breakthrough symptoms.   Avoid the following potential triggers: alcohol, tight clothing, NSAIDs, heat.  Seasonal and perennial allergic rhinoconjunctivitis Past history - 2020 skin testing was positive to ragweed, weed, dust mite.  Continue environmental control measures.  Continue antihistamines as above.  Continue Singulair (montelukast) 10 mg daily at night.   May use pataday 1 drop in each as daily as needed for itchy/watery eyes.  May use Flonase (fluticasone) nasal spray 1 spray per nostril twice a day as needed for nasal congestion.   Nasal saline spray (i.e., Simply Saline) or nasal saline lavage (i.e., NeilMed) is recommended as needed and prior to medicated nasal sprays.  Follow up in 4 months or sooner if needed.   Reducing Pollen Exposure . Pollen seasons: trees (spring), grass (summer) and ragweed/weeds (fall). Marland Kitchen Keep windows closed in your home and car to lower pollen exposure.  Susa Simmonds air conditioning in the bedroom and throughout the house if possible.  . Avoid going out in dry windy days - especially early morning. . Pollen  counts are highest between 5 - 10 AM and on dry, hot and windy days.  . Save outside activities for late afternoon or after a heavy rain, when pollen levels are lower.  . Avoid mowing of grass if you have grass pollen allergy. Marland Kitchen Be aware that pollen can also be transported indoors on people and pets.  . Dry your clothes in an automatic dryer rather than hanging them outside where they might collect pollen.  . Rinse hair and eyes before bedtime.  Control of House Dust Mite Allergen . Dust mite allergens are a common trigger of allergy and asthma symptoms. While they can be found throughout the house, these microscopic creatures thrive in warm, humid environments such as bedding, upholstered furniture and carpeting. . Because so much time is spent in the bedroom, it is essential to reduce mite levels there.  . Encase pillows, mattresses, and box springs in special allergen-proof fabric covers or airtight, zippered plastic covers.  . Bedding should be washed weekly in hot water (130 F) and dried in a hot dryer. Allergen-proof covers are available for comforters and pillows that can't be regularly washed.  Wendee Copp the allergy-proof covers every few months. Minimize clutter in the bedroom. Keep pets out of the bedroom.  Marland Kitchen Keep humidity less than 50% by using a dehumidifier or air conditioning. You can buy a humidity measuring device called a hygrometer to monitor this.  . If possible, replace carpets with hardwood, linoleum, or washable  area rugs. If that's not possible, vacuum frequently with a vacuum that has a HEPA filter. . Remove all upholstered furniture and non-washable window drapes from the bedroom. . Remove all non-washable stuffed toys from the bedroom.  Wash stuffed toys weekly.

## 2020-02-29 NOTE — Assessment & Plan Note (Signed)
Past history - 2020 skin testing was positive to ragweed, weed, dust mite. Interim history - stable with below regimen.  Continue environmental control measures.  Continue antihistamines as above.  Continue Singulair (montelukast) 10 mg daily at night.   May use pataday 1 drop in each as daily as needed for itchy/watery eyes.  May use Flonase (fluticasone) nasal spray 1 spray per nostril twice a day as needed for nasal congestion.   Nasal saline spray (i.e., Simply Saline) or nasal saline lavage (i.e., NeilMed) is recommended as needed and prior to medicated nasal sprays.

## 2020-02-29 NOTE — Assessment & Plan Note (Addendum)
Past history - 2020 blood work was unremarkable for CBC diff, alpha gal, ANA, thyroid, CU index, H. pylori test, tryptase.  ESR was slightly elevated. Triggers include stress. Interim history - Unable to wean off antihistamines as it caused breakthrough hives.  Continue with zyrtec 81m in the morning and 2105min the evening.  Continue with famotidine 2032mwice a day.  May try wean using below schedule if hives under controlled.  Weaning schedule:  Take zyrtec 41m26m the morning, 20mg14mthe evening + famotidine 20mg 36me a day. If no hives for 2-3 weeks:  Take zyrtec 41mg t78m a day + famotidine 20mg tw63ma day. If no hives for 2-3 weeks:  Take zyrtec 41mg twi17m day + famotidine 20mg once26may. If no hives for 2-3 weeks:  Take zyrtec 41mg twice52may and stop famotidine.   Keep track of hives and when they are occurring.  If you have another flare like the last time then call our office or send me a mychart message.   Continue with Xolair injections 300mg every 81mweeks.  May use hydroxyzine 25mg every 836mrs for breakthrough symptoms.   Avoid the following potential triggers: alcohol, tight clothing, NSAIDs, heat.

## 2020-03-16 ENCOUNTER — Ambulatory Visit: Payer: 59

## 2020-03-22 ENCOUNTER — Ambulatory Visit (INDEPENDENT_AMBULATORY_CARE_PROVIDER_SITE_OTHER): Payer: 59 | Admitting: *Deleted

## 2020-03-22 ENCOUNTER — Other Ambulatory Visit: Payer: Self-pay

## 2020-03-22 DIAGNOSIS — L501 Idiopathic urticaria: Secondary | ICD-10-CM | POA: Diagnosis not present

## 2020-03-24 ENCOUNTER — Other Ambulatory Visit: Payer: Self-pay | Admitting: Allergy

## 2020-03-28 ENCOUNTER — Telehealth (INDEPENDENT_AMBULATORY_CARE_PROVIDER_SITE_OTHER): Payer: 59 | Admitting: Family

## 2020-03-28 DIAGNOSIS — H6990 Unspecified Eustachian tube disorder, unspecified ear: Secondary | ICD-10-CM

## 2020-03-28 DIAGNOSIS — H6980 Other specified disorders of Eustachian tube, unspecified ear: Secondary | ICD-10-CM

## 2020-03-28 MED ORDER — AZITHROMYCIN 250 MG PO TABS: ORAL_TABLET | ORAL | 0 refills | Status: DC

## 2020-03-28 NOTE — Progress Notes (Signed)
Brittany [name] is a 54 y.o. female with the following history as recorded in EpicCare:  Patient Active Problem List   Diagnosis Date Noted  . Chronic idiopathic urticaria 01/17/2019  . Seasonal and perennial allergic rhinoconjunctivitis 11/08/2018  . Allergic reaction 11/08/2018  . Diarrhea 11/08/2018  . Right thigh pain 05/19/2018  . Melena   . Acute gastrojejunal anastomotic ulcer   . Macrocytic anemia   . Acute GI bleeding 09/28/2017  . Epigastric abdominal pain 11/19/2016  . Incisional hernia with obstruction but no gangrene 07/21/2016  . Menopause 04/22/2016  . Routine general medical examination at a health care facility 03/18/2016  . Obesity 03/18/2016  . Severe episode of recurrent major depressive disorder, without psychotic features ([place])   . Depression, major, severe recurrence ([place]) 09/11/2015  . Major depressive disorder, recurrent, severe without psychotic features ([place])   . MDD (major depressive disorder) 10/30/2014  . MDD (major depressive disorder), recurrent severe, without psychosis ([place])   . Suicidal ideation 10/05/2014  . Depression 06/12/2014  . Rash 01/04/2014  . Normocytic anemia 01/04/2014  . DJD (degenerative joint disease) 01/04/2014  . Hypertension 01/04/2014  . Dyslipidemia 01/04/2014  . GERD (gastroesophageal reflux disease) 01/04/2014  . Obstructive sleep apnea 01/04/2014  . Status post small bowel resection 01/04/2014  . Abdominal abscess 01/01/2014  . Wound infection after surgery 01/01/2014  . PTSD (post-traumatic stress disorder) 11/23/2013  . Severe recurrent major depression without psychotic features ([place]) 11/22/2013  . 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 Morbid obesity, Weight - 312, BMI - 45.2 04/21/2012    Current Outpatient Medications  Medication Sig Dispense Refill  . acetaminophen (TYLENOL 8 HOUR ARTHRITIS PAIN) 650 MG CR tablet Take 2,600 mg by mouth once.    Marland Kitchen azithromycin (ZITHROMAX) 250 MG tablet 2  tabs po qd x 1 day; 1 tablet per day x 4 days; 6 tablet 0  . calcium-vitamin D (OSCAL WITH D) 500-200 MG-UNIT tablet Take 3 tablets by mouth every morning. For bone health (Patient taking differently: Take 3 tablets by mouth every evening. For bone health)    . cetirizine (ZYRTEC) 10 MG tablet Take 20 mg by mouth 2 (two) times daily.    . cholecalciferol (VITAMIN D) 1000 units tablet Take 1 tablet (1,000 Units total) by mouth daily. For bone health    . DULoxetine (CYMBALTA) 60 MG capsule Take 1 capsule (60 mg total) by mouth 2 (two) times daily. 180 capsule 0  . EPINEPHrine 0.3 mg/0.3 mL IJ SOAJ injection Inject 0.3 mLs (0.3 mg total) into the muscle as needed for anaphylaxis. 1 each 1  . famotidine (PEPCID) 20 MG tablet Take 1 tablet (20 mg total) by mouth 2 (two) times daily as needed for heartburn or indigestion. 60 tablet 5  . hydrOXYzine (ATARAX/VISTARIL) 25 MG tablet TAKE 1 TABLET BY MOUTH THREE TIMES DAILY AS NEEDED 90 tablet 0  . LORazepam (ATIVAN) 0.5 MG tablet TAKE 1 TABLET(0.5 MG) BY MOUTH DAILY AS NEEDED FOR ANXIETY 20 tablet 0  . montelukast (SINGULAIR) 10 MG tablet TAKE 1 TABLET(10 MG) BY MOUTH AT BEDTIME 30 tablet 5  . Multiple Vitamin (MULTI-VITAMIN) tablet Take by mouth.    Marland Kitchen omalizumab (XOLAIR) 150 MG injection Inject 300 mg into the skin every 14 (fourteen) days. 4 each 11  . pantoprazole (PROTONIX) 40 MG tablet TAKE 1 TABLET BY MOUTH TWICE DAILY 60 tablet 0  . PATADAY 0.2 % SOLN Place 1 drop into both eyes daily as needed. 2.5 mL  5  . QUEtiapine (SEROQUEL) 50 MG tablet TAKE 3 TABLETS(150 MG) BY MOUTH AT BEDTIME 90 tablet 2  . vitamin B-12 (CYANOCOBALAMIN) 500 MCG tablet Take 1 tablet (500 mcg total) by mouth daily. For Vitamin B-12 replacement    . vitamin E 1000 UNIT capsule Take 1,000 Units by mouth daily.     Current Facility-Administered Medications  Medication Dose Route Frequency Provider Last Rate Last Admin  . omalizumab Arvid Right) injection 300 mg  300 mg Subcutaneous  Q28 days Garnet Sierras, DO   300 mg at 03/22/20 3818    Allergies: Contrast media [iodinated diagnostic agents], Amoxicillin, Bactrim [sulfamethoxazole-trimethoprim], and Lamictal [lamotrigine]  Past Medical History:  Diagnosis Date  . Allergy   . Angio-edema   . Anxiety   . Arthritis    hips, knees and hands   . Blood transfusion without reported diagnosis   . Depression   . GERD (gastroesophageal reflux disease)    hx of   . H/O hiatal hernia   . History of chicken pox   . Hypercholesterolemia   . Hypertension   . Migraine   . Migraines   . Morbid obesity ([place])   . Positive TB test   . PTSD (post-traumatic stress disorder)   . Sleep apnea    no CPAP machine   . Ulcer, stomach peptic   . Urticaria     Past Surgical History:  Procedure Laterality Date  . ABDOMINAL HYSTERECTOMY  1997 & 2006  . BREATH TEK H PYLORI  05/03/2012   Procedure: BREATH TEK H PYLORI;  Surgeon: [name], MD;  Location: [name];  Service: General;  Laterality: N/A;  . COLONOSCOPY     hx of benign polyps   . ESOPHAGOGASTRODUODENOSCOPY N/A 09/29/2017   Procedure: ESOPHAGOGASTRODUODENOSCOPY (EGD);  Surgeon: Ladene Artist, MD;  Location: [name];  Service: Endoscopy;  Laterality: N/A;  . FOOT SURGERY    . GASTRIC BYPASS    . GASTRIC ROUX-EN-Y N/A 07/05/2012   Procedure: LAPAROSCOPIC ROUX-EN-Y GASTRIC;  Surgeon: [name], MD;  Location: [facility];  Service: General;  Laterality: N/A;  . IRRIGATION AND DEBRIDEMENT ABSCESS N/A 01/01/2014   Procedure: IRRIGATION AND DEBRIDEMENT ABSCESS;  Surgeon: [name], MD;  Location: [facility];  Service: General;  Laterality: N/A;  . LAPAROSCOPIC LYSIS OF ADHESIONS N/A 07/05/2012   Procedure: LAPAROSCOPIC LYSIS OF ADHESIONS;  Surgeon: [name], MD;  Location: [facility];  Service: General;  Laterality: N/A;  repair of abdominal wall hernia  . right tube and ovary removed     . UPPER GI ENDOSCOPY N/A 07/05/2012   Procedure: UPPER GI ENDOSCOPY;   Surgeon: [name], MD;  Location: [facility];  Service: General;  Laterality: N/A;    Family History  Problem Relation Age of Onset  . Colon cancer Mother 55  . Depression Father   . Diabetes Father   . Heart disease Father   . Bladder Cancer Father   . Depression Sister   . Post-traumatic stress disorder Sister   . Alcohol abuse Paternal Uncle   . Bipolar disorder Cousin   . Anxiety disorder Sister   . Eczema Sister   . Cancer Maternal Aunt        breast  . Breast cancer Maternal Aunt   . Melanoma Maternal Grandmother   . Colon cancer Maternal Grandmother        does not know age of onset  . Healthy Maternal Grandfather   . Healthy Paternal Grandmother   .  Heart disease Paternal Grandfather   . Stroke Paternal Grandfather   . Diabetes Paternal Grandfather     Social History   Tobacco Use  . Smoking status: Former Smoker    Packs/day: 0.10    Years: 1.00    Pack years: 0.10    Types: Cigarettes    Quit date: 04/27/2004    Years since quitting: 15.9  . Smokeless tobacco: Never Used  Substance Use Topics  . Alcohol use: No    Alcohol/week: 0.0 standard drinks    Comment: Occasional use/ 2 drinks a month    Subjective:   I connected with Brittany [name] on 03/28/20 at 11:20 AM EST by a video enabled telemedicine application and verified that I am speaking with the correct person using two identifiers.   I discussed the limitations of evaluation and management by telemedicine and the availability of in person appointments. The patient expressed understanding and agreed to proceed. Provider in office/ patient is at home; provider and patient are only 2 people on video call.   Ear pressure/ sinus congestion x 2-3 weeks; "feels like a dull ache." Taking daily allergy medication; no underlying dental issues; no history of needing to have ear wax removed;   Objective:  There were no vitals filed for this visit.  General: Well developed, well nourished, in no acute distress   Head: Normocephalic and atraumatic  Lungs: Respirations unlabored;  Neurologic: Alert and oriented; speech intact; face symmetrical;   Assessment:  1. Dysfunction of Eustachian tube, unspecified laterality     Plan:  Recommend to re-start Flonase; continue allergy medications; will start Zithromax as well; she will need in office visit if symptoms persist.  No follow-ups on file.  No orders of the defined types were placed in this encounter.   Requested Prescriptions   Signed Prescriptions Disp Refills  . azithromycin (ZITHROMAX) 250 MG tablet 6 tablet 0    Sig: 2 tabs po qd x 1 day; 1 tablet per day x 4 days;

## 2020-03-29 ENCOUNTER — Ambulatory Visit
Admission: RE | Admit: 2020-03-29 | Discharge: 2020-03-29 | Disposition: A | Discharge status: Home / Self Care | Payer: 59 | Source: Ambulatory Visit | Attending: Obstetrics and Gynecology | Admitting: Obstetrics and Gynecology

## 2020-03-29 ENCOUNTER — Other Ambulatory Visit: Payer: Self-pay

## 2020-03-29 DIAGNOSIS — N6320 Unspecified lump in the left breast, unspecified quadrant: Secondary | ICD-10-CM

## 2020-03-30 ENCOUNTER — Ambulatory Visit: Payer: 59 | Attending: Internal Medicine

## 2020-03-30 DIAGNOSIS — Z23 Encounter for immunization: Secondary | ICD-10-CM

## 2020-03-30 NOTE — Progress Notes (Signed)
   Covid-19 Vaccination Clinic  Name:  Brittany [name]    MRN: [id number] DOB: 1966/03/22  03/30/2020  [name] was observed post Covid-19 immunization for 30 minutes based on pre-vaccination screening without incident. She was provided with Vaccine Information Sheet and instruction to access the V-Safe system.   [name] was instructed to call 911 with any severe reactions post vaccine: Marland Kitchen Difficulty breathing  . Swelling of face and throat  . A fast heartbeat  . A bad rash all over body  . Dizziness and weakness   Immunizations Administered    Name Date Dose VIS Date [facility] COVID-19 Vaccine 03/30/2020  2:05 PM 0.3 mL 02/01/2020 Intramuscular   Manufacturer: [place]   Lot: [id number]   [place]: [id number]

## 2020-04-02 ENCOUNTER — Encounter: Payer: Self-pay | Admitting: Family

## 2020-04-02 ENCOUNTER — Ambulatory Visit (INDEPENDENT_AMBULATORY_CARE_PROVIDER_SITE_OTHER): Payer: 59 | Admitting: Family

## 2020-04-02 ENCOUNTER — Other Ambulatory Visit: Payer: Self-pay

## 2020-04-02 VITALS — BP 120/68 | HR 66 | Temp 97.8°F | Ht 69.0 in | Wt 275.0 lb

## 2020-04-02 DIAGNOSIS — H6980 Other specified disorders of Eustachian tube, unspecified ear: Secondary | ICD-10-CM | POA: Diagnosis not present

## 2020-04-02 NOTE — Patient Instructions (Signed)
Eustachian Tube Dysfunction ° °Eustachian tube dysfunction refers to a condition in which a blockage develops in the narrow passage that connects the middle ear to the back of the nose (eustachian tube). The eustachian tube regulates air pressure in the middle ear by letting air move between the ear and nose. It also helps to drain fluid from the middle ear space. °Eustachian tube dysfunction can affect one or both ears. When the eustachian tube does not function properly, air pressure, fluid, or both can build up in the middle ear. °What are the causes? °This condition occurs when the eustachian tube becomes blocked or cannot open normally. Common causes of this condition include: °· Ear infections. °· Colds and other infections that affect the nose, mouth, and throat (upper respiratory tract). °· Allergies. °· Irritation from cigarette smoke. °· Irritation from stomach acid coming up into the esophagus (gastroesophageal reflux). The esophagus is the tube that carries food from the mouth to the stomach. °· Sudden changes in air pressure, such as from descending in an airplane or scuba diving. °· Abnormal growths in the nose or throat, such as: °? Growths that line the nose (nasal polyps). °? Abnormal growth of cells (tumors). °? Enlarged tissue at the back of the throat (adenoids). °What increases the risk? °You are more likely to develop this condition if: °· You smoke. °· You are overweight. °· You are a child who has: °? Certain birth defects of the mouth, such as cleft palate. °? Large tonsils or adenoids. °What are the signs or symptoms? °Common symptoms of this condition include: °· A feeling of fullness in the ear. °· Ear pain. °· Clicking or popping noises in the ear. °· Ringing in the ear. °· Hearing loss. °· Loss of balance. °· Dizziness. °Symptoms may get worse when the air pressure around you changes, such as when you travel to an area of high elevation, fly on an airplane, or go scuba diving. °How is  this diagnosed? °This condition may be diagnosed based on: °· Your symptoms. °· A physical exam of your ears, nose, and throat. °· Tests, such as those that measure: °? The movement of your eardrum (tympanogram). °? Your hearing (audiometry). °How is this treated? °Treatment depends on the cause and severity of your condition. °· In mild cases, you may relieve your symptoms by moving air into your ears. This is called "popping the ears." °· In more severe cases, or if you have symptoms of fluid in your ears, treatment may include: °? Medicines to relieve congestion (decongestants). °? Medicines that treat allergies (antihistamines). °? Nasal sprays or ear drops that contain medicines that reduce swelling (steroids). °? A procedure to drain the fluid in your eardrum (myringotomy). In this procedure, a small tube is placed in the eardrum to: °§ Drain the fluid. °§ Restore the air in the middle ear space. °? A procedure to insert a balloon device through the nose to inflate the opening of the eustachian tube (balloon dilation). °Follow these instructions at home: °Lifestyle °· Do not do any of the following until your health care provider approves: °? Travel to high altitudes. °? Fly in airplanes. °? Work in a pressurized cabin or room. °? Scuba dive. °· Do not use any products that contain nicotine or tobacco, such as cigarettes and e-cigarettes. If you need help quitting, ask your health care provider. °· Keep your ears dry. Wear fitted earplugs during showering and bathing. Dry your ears completely after. °General instructions °· Take over-the-counter   and prescription medicines only as told by your health care provider. °· Use techniques to help pop your ears as recommended by your health care provider. These may include: °? Chewing gum. °? Yawning. °? Frequent, forceful swallowing. °? Closing your mouth, holding your nose closed, and gently blowing as if you are trying to blow air out of your nose. °· Keep all  follow-up visits as told by your health care provider. This is important. °Contact a health care provider if: °· Your symptoms do not go away after treatment. °· Your symptoms come back after treatment. °· You are unable to pop your ears. °· You have: °? A fever. °? Pain in your ear. °? Pain in your head or neck. °? Fluid draining from your ear. °· Your hearing suddenly changes. °· You become very dizzy. °· You lose your balance. °Summary °· Eustachian tube dysfunction refers to a condition in which a blockage develops in the eustachian tube. °· It can be caused by ear infections, allergies, inhaled irritants, or abnormal growths in the nose or throat. °· Symptoms include ear pain, hearing loss, or ringing in the ears. °· Mild cases are treated with maneuvers to unblock the ears, such as yawning or ear popping. °· Severe cases are treated with medicines. Surgery may also be done (rare). °This information is not intended to replace advice given to you by your health care provider. Make sure you discuss any questions you have with your health care provider. °Document Revised: 07/21/2017 Document Reviewed: 07/21/2017 °Elsevier Patient Education © 2020 [employer] ° °

## 2020-04-02 NOTE — Progress Notes (Signed)
Brittany [name] is a 54 y.o. female with the following history as recorded in EpicCare:  Patient Active Problem List   Diagnosis Date Noted  . Chronic idiopathic urticaria 01/17/2019  . Seasonal and perennial allergic rhinoconjunctivitis 11/08/2018  . Allergic reaction 11/08/2018  . Diarrhea 11/08/2018  . Right thigh pain 05/19/2018  . Melena   . Acute gastrojejunal anastomotic ulcer   . Macrocytic anemia   . Acute GI bleeding 09/28/2017  . Epigastric abdominal pain 11/19/2016  . Incisional hernia with obstruction but no gangrene 07/21/2016  . Menopause 04/22/2016  . Routine general medical examination at a health care facility 03/18/2016  . Obesity 03/18/2016  . Severe episode of recurrent major depressive disorder, without psychotic features ([place])   . Depression, major, severe recurrence ([place]) 09/11/2015  . Major depressive disorder, recurrent, severe without psychotic features ([place])   . MDD (major depressive disorder) 10/30/2014  . MDD (major depressive disorder), recurrent severe, without psychosis ([place])   . Suicidal ideation 10/05/2014  . Depression 06/12/2014  . Rash 01/04/2014  . Normocytic anemia 01/04/2014  . DJD (degenerative joint disease) 01/04/2014  . Hypertension 01/04/2014  . Dyslipidemia 01/04/2014  . GERD (gastroesophageal reflux disease) 01/04/2014  . Obstructive sleep apnea 01/04/2014  . Status post small bowel resection 01/04/2014  . Abdominal abscess 01/01/2014  . Wound infection after surgery 01/01/2014  . PTSD (post-traumatic stress disorder) 11/23/2013  . Severe recurrent major depression without psychotic features ([place]) 11/22/2013  . 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 Morbid obesity, Weight - 312, BMI - 45.2 04/21/2012    Current Outpatient Medications  Medication Sig Dispense Refill  . acetaminophen (TYLENOL) 650 MG CR tablet Take 2,600 mg by mouth once.    . calcium-vitamin D (OSCAL WITH D) 500-200 MG-UNIT tablet Take 3  tablets by mouth every morning. For bone health (Patient taking differently: Take 3 tablets by mouth every evening. For bone health)    . cetirizine (ZYRTEC) 10 MG tablet Take 20 mg by mouth 2 (two) times daily.    . cholecalciferol (VITAMIN D) 1000 units tablet Take 1 tablet (1,000 Units total) by mouth daily. For bone health    . DULoxetine (CYMBALTA) 60 MG capsule Take 1 capsule (60 mg total) by mouth 2 (two) times daily. 180 capsule 0  . EPINEPHrine 0.3 mg/0.3 mL IJ SOAJ injection Inject 0.3 mLs (0.3 mg total) into the muscle as needed for anaphylaxis. 1 each 1  . famotidine (PEPCID) 20 MG tablet Take 1 tablet (20 mg total) by mouth 2 (two) times daily as needed for heartburn or indigestion. 60 tablet 5  . hydrOXYzine (ATARAX/VISTARIL) 25 MG tablet TAKE 1 TABLET BY MOUTH THREE TIMES DAILY AS NEEDED 90 tablet 0  . LORazepam (ATIVAN) 0.5 MG tablet TAKE 1 TABLET(0.5 MG) BY MOUTH DAILY AS NEEDED FOR ANXIETY 20 tablet 0  . montelukast (SINGULAIR) 10 MG tablet TAKE 1 TABLET(10 MG) BY MOUTH AT BEDTIME 30 tablet 5  . Multiple Vitamin (MULTI-VITAMIN) tablet Take by mouth.    Marland Kitchen omalizumab (XOLAIR) 150 MG injection Inject 300 mg into the skin every 14 (fourteen) days. 4 each 11  . pantoprazole (PROTONIX) 40 MG tablet TAKE 1 TABLET BY MOUTH TWICE DAILY 60 tablet 0  . PATADAY 0.2 % SOLN Place 1 drop into both eyes daily as needed. 2.5 mL 5  . QUEtiapine (SEROQUEL) 50 MG tablet TAKE 3 TABLETS(150 MG) BY MOUTH AT BEDTIME 90 tablet 2  . vitamin B-12 (CYANOCOBALAMIN) 500 MCG tablet Take  1 tablet (500 mcg total) by mouth daily. For Vitamin B-12 replacement    . vitamin E 1000 UNIT capsule Take 1,000 Units by mouth daily.     Current Facility-Administered Medications  Medication Dose Route Frequency Provider Last Rate Last Admin  . omalizumab Arvid Right) injection 300 mg  300 mg Subcutaneous Q28 days Garnet Sierras, DO   300 mg at 03/22/20 6967    Allergies: Contrast media [iodinated diagnostic agents], Amoxicillin,  Bactrim [sulfamethoxazole-trimethoprim], and Lamictal [lamotrigine]  Past Medical History:  Diagnosis Date  . Allergy   . Angio-edema   . Anxiety   . Arthritis    hips, knees and hands   . Blood transfusion without reported diagnosis   . Depression   . GERD (gastroesophageal reflux disease)    hx of   . H/O hiatal hernia   . History of chicken pox   . Hypercholesterolemia   . Hypertension   . Migraine   . Migraines   . Morbid obesity ([place])   . Positive TB test   . PTSD (post-traumatic stress disorder)   . Sleep apnea    no CPAP machine   . Ulcer, stomach peptic   . Urticaria     Past Surgical History:  Procedure Laterality Date  . ABDOMINAL HYSTERECTOMY  1997 & 2006  . BREATH TEK H PYLORI  05/03/2012   Procedure: BREATH TEK H PYLORI;  Surgeon: [name], MD;  Location: [name];  Service: General;  Laterality: N/A;  . COLONOSCOPY     hx of benign polyps   . ESOPHAGOGASTRODUODENOSCOPY N/A 09/29/2017   Procedure: ESOPHAGOGASTRODUODENOSCOPY (EGD);  Surgeon: Ladene Artist, MD;  Location: [name];  Service: Endoscopy;  Laterality: N/A;  . FOOT SURGERY    . GASTRIC BYPASS    . GASTRIC ROUX-EN-Y N/A 07/05/2012   Procedure: LAPAROSCOPIC ROUX-EN-Y GASTRIC;  Surgeon: [name], MD;  Location: [facility];  Service: General;  Laterality: N/A;  . IRRIGATION AND DEBRIDEMENT ABSCESS N/A 01/01/2014   Procedure: IRRIGATION AND DEBRIDEMENT ABSCESS;  Surgeon: [name], MD;  Location: [facility];  Service: General;  Laterality: N/A;  . LAPAROSCOPIC LYSIS OF ADHESIONS N/A 07/05/2012   Procedure: LAPAROSCOPIC LYSIS OF ADHESIONS;  Surgeon: [name], MD;  Location: [facility];  Service: General;  Laterality: N/A;  repair of abdominal wall hernia  . right tube and ovary removed     . UPPER GI ENDOSCOPY N/A 07/05/2012   Procedure: UPPER GI ENDOSCOPY;  Surgeon: [name], MD;  Location: [facility];  Service: General;  Laterality: N/A;    Family History  Problem Relation Age  of Onset  . Colon cancer Mother 33  . Depression Father   . Diabetes Father   . Heart disease Father   . Bladder Cancer Father   . Depression Sister   . Post-traumatic stress disorder Sister   . Alcohol abuse Paternal Uncle   . Bipolar disorder Cousin   . Anxiety disorder Sister   . Eczema Sister   . Cancer Maternal Aunt        breast  . Breast cancer Maternal Aunt   . Melanoma Maternal Grandmother   . Colon cancer Maternal Grandmother        does not know age of onset  . Healthy Maternal Grandfather   . Healthy Paternal Grandmother   . Heart disease Paternal Grandfather   . Stroke Paternal Grandfather   . Diabetes Paternal Grandfather     Social History   Tobacco  Use  . Smoking status: Former Smoker    Packs/day: 0.10    Years: 1.00    Pack years: 0.10    Types: Cigarettes    Quit date: 04/27/2004    Years since quitting: 15.9  . Smokeless tobacco: Never Used  Substance Use Topics  . Alcohol use: No    Alcohol/week: 0.0 standard drinks    Comment: Occasional use/ 2 drinks a month    Subjective:  Seen last week for virtual visit and treated for possible sinusitis/ left ear infection; chose to use Zithromax due to persistent problem with hives- she knows she can tolerate this medication; is feeling better but symptoms still persisting; did have root canal/crown replaced on left side of mouth earlier this year;   Objective:  Vitals:   04/02/20 1549  BP: 120/68  Pulse: 66  Temp: 97.8 F (36.6 C)  TempSrc: Oral  SpO2: 96%  Weight: 275 lb (124.7 kg)  Height: 5\' 9"  (1.753 m)    General: Well developed, well nourished, in no acute distress  Skin : Warm and dry.  Head: Normocephalic and atraumatic  Eyes: Sclera and conjunctiva clear; pupils round and reactive to light; extraocular movements intact  Ears: External normal; canals clear; tympanic membranes normal  Oropharynx: Pink, supple. No suspicious lesions  Neck: Supple without thyromegaly, adenopathy  Lungs:  Respirations unlabored; clear to auscultation bilaterally without wheeze, rales, rhonchi  Neurologic: Alert and oriented; speech intact; face symmetrical; moves all extremities well; CNII-XII intact without focal deficit   Assessment:  1. Dysfunction of Eustachian tube, unspecified laterality     Plan:  Discussed trial of Prednisone but she does not tolerate well; Continue Flonase; try adding Afrin for the next 2-3 days; ? If dental source of symptoms; may also need to consider ENT evaluation; follow up to be determined;  This visit occurred during the SARS-CoV-2 public health emergency.  Safety protocols were in place, including screening questions prior to the visit, additional usage of staff PPE, and extensive cleaning of exam room while observing appropriate contact time as indicated for disinfecting solutions.     No follow-ups on file.  No orders of the defined types were placed in this encounter.   Requested Prescriptions    No prescriptions requested or ordered in this encounter

## 2020-04-18 ENCOUNTER — Ambulatory Visit (INDEPENDENT_AMBULATORY_CARE_PROVIDER_SITE_OTHER): Payer: 59 | Admitting: Psychology

## 2020-04-18 DIAGNOSIS — F411 Generalized anxiety disorder: Secondary | ICD-10-CM

## 2020-04-18 DIAGNOSIS — F331 Major depressive disorder, recurrent, moderate: Secondary | ICD-10-CM | POA: Diagnosis not present

## 2020-04-20 ENCOUNTER — Encounter: Payer: Self-pay | Admitting: Obstetrics and Gynecology

## 2020-04-20 ENCOUNTER — Other Ambulatory Visit: Payer: Self-pay

## 2020-04-20 ENCOUNTER — Ambulatory Visit (INDEPENDENT_AMBULATORY_CARE_PROVIDER_SITE_OTHER): Payer: 59 | Admitting: Obstetrics and Gynecology

## 2020-04-20 VITALS — BP 126/80 | Ht 69.0 in | Wt 273.0 lb

## 2020-04-20 DIAGNOSIS — Z113 Encounter for screening for infections with a predominantly sexual mode of transmission: Secondary | ICD-10-CM

## 2020-04-20 DIAGNOSIS — R922 Inconclusive mammogram: Secondary | ICD-10-CM

## 2020-04-20 DIAGNOSIS — Z124 Encounter for screening for malignant neoplasm of cervix: Secondary | ICD-10-CM

## 2020-04-20 DIAGNOSIS — Z01419 Encounter for gynecological examination (general) (routine) without abnormal findings: Secondary | ICD-10-CM | POA: Diagnosis not present

## 2020-04-20 DIAGNOSIS — R9230 Dense breasts, unspecified: Secondary | ICD-10-CM

## 2020-04-20 NOTE — Addendum Note (Signed)
Addended by: Lorine Bears on: 04/20/2020 01:42 PM   Modules accepted: Orders

## 2020-04-20 NOTE — Progress Notes (Signed)
Jalana Janan Halter [birth year] [id number]  SUBJECTIVE:  55 y.o. G0P0000 female here for a breast and pelvic exam and Pap smear. She has no gynecologic concerns.  Current Outpatient Medications  Medication Sig Dispense Refill  . acetaminophen (TYLENOL) 650 MG CR tablet Take 2,600 mg by mouth once.    . calcium-vitamin D (OSCAL WITH D) 500-200 MG-UNIT tablet Take 3 tablets by mouth every morning. For bone health (Patient taking differently: Take 3 tablets by mouth every evening. For bone health)    . cetirizine (ZYRTEC) 10 MG tablet Take 20 mg by mouth 2 (two) times daily.    . cholecalciferol (VITAMIN D) 1000 units tablet Take 1 tablet (1,000 Units total) by mouth daily. For bone health    . DULoxetine (CYMBALTA) 60 MG capsule Take 1 capsule (60 mg total) by mouth 2 (two) times daily. 180 capsule 0  . EPINEPHrine 0.3 mg/0.3 mL IJ SOAJ injection Inject 0.3 mLs (0.3 mg total) into the muscle as needed for anaphylaxis. 1 each 1  . famotidine (PEPCID) 20 MG tablet Take 1 tablet (20 mg total) by mouth 2 (two) times daily as needed for heartburn or indigestion. 60 tablet 5  . hydrOXYzine (ATARAX/VISTARIL) 25 MG tablet TAKE 1 TABLET BY MOUTH THREE TIMES DAILY AS NEEDED 90 tablet 0  . LORazepam (ATIVAN) 0.5 MG tablet TAKE 1 TABLET(0.5 MG) BY MOUTH DAILY AS NEEDED FOR ANXIETY 20 tablet 0  . montelukast (SINGULAIR) 10 MG tablet TAKE 1 TABLET(10 MG) BY MOUTH AT BEDTIME 30 tablet 5  . Multiple Vitamin (MULTI-VITAMIN) tablet Take by mouth.    Marland Kitchen omalizumab (XOLAIR) 150 MG injection Inject 300 mg into the skin every 14 (fourteen) days. 4 each 11  . pantoprazole (PROTONIX) 40 MG tablet TAKE 1 TABLET BY MOUTH TWICE DAILY 60 tablet 0  . PATADAY 0.2 % SOLN Place 1 drop into both eyes daily as needed. 2.5 mL 5  . QUEtiapine (SEROQUEL) 50 MG tablet TAKE 3 TABLETS(150 MG) BY MOUTH AT BEDTIME 90 tablet 2  . vitamin B-12 (CYANOCOBALAMIN) 500 MCG tablet Take 1 tablet (500 mcg total) by mouth daily. For Vitamin B-12  replacement    . vitamin E 1000 UNIT capsule Take 1,000 Units by mouth daily.     Current Facility-Administered Medications  Medication Dose Route Frequency Provider Last Rate Last Admin  . omalizumab Arvid Right) injection 300 mg  300 mg Subcutaneous Q28 days Garnet Sierras, DO   300 mg at 03/22/20 2505   Allergies: Contrast media [iodinated diagnostic agents], Amoxicillin, Bactrim [sulfamethoxazole-trimethoprim], and Lamictal [lamotrigine]  No LMP recorded. Patient has had a hysterectomy.  Past medical history,surgical history, problem list, medications, allergies, family history and social history were all reviewed and documented as reviewed in the EPIC chart.  GYN ROS: no abnormal bleeding, pelvic pain or discharge, no breast pain or new or enlarging lumps on self exam.  No dysuria, frequency, burning, pain with urination, cloudy/malodorous urine.   OBJECTIVE:  BP 126/80 (BP Location: Right Arm, Patient Position: Sitting, Cuff Size: Large)   Ht 5\' 9"  (1.753 m)   Wt 273 lb (123.8 kg)   BMI 40.32 kg/m  The patient appears well, alert, oriented, in no distress.  BREAST EXAM: right breast normal without mass, skin or nipple changes or axillary nodes, left breast normal without mass, skin or nipple changes or axillary nodes Ridge of tissue deep near chest wall noted about 6 cm off left nipple at 3:00, non tender, stable from prior exam  PELVIC EXAM:  VULVA: normal appearing vulva with atrophic changes, no masses, tenderness or lesions, VAGINA: normal appearing vagina with atrophic changes, normal color and discharge, no lesions, CERVIX: Atrophic changes, grossly normal, high in vaginal vault flush with upper vagina, UTERUS: surgically absent, ADNEXA: no masses, nontender PAP: Pap smear done today, thin-prep method  Chaperone: Wandra Scot Bonham present during the examination  ASSESSMENT:  55 y.o. G0P0000 here for a breast and pelvic exam  PLAN:   1. Postmenopausal. Prior supracervical  hysterectomy and separately as BSO.  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   2. STI screening performed with GC/chlamydia screen. Patient had reengaged in sexual intercourse with inconsistent condom use in the past few months but currently not sexually active. 3. Pap smear 04/2016. No significant history of abnormal Pap smears. Pap smear is collected today. 4. Mammogram 03/2020.  Diagnostic imaging was performed due to patient's concern about a left breast lump Normal breast exam today.  She will continue with annual mammograms. 5. Colonoscopy 2018.  She will follow up at the interval recommended by her GI specialist.   6. DEXA never.  Plan when further into menopause. 7. Health maintenance.  No labs today as she normally has these completed elsewhere.  Return annually or sooner, prn.  Joseph Pierini MD 04/20/20

## 2020-04-23 ENCOUNTER — Ambulatory Visit (INDEPENDENT_AMBULATORY_CARE_PROVIDER_SITE_OTHER): Payer: 59

## 2020-04-23 ENCOUNTER — Other Ambulatory Visit: Payer: Self-pay

## 2020-04-23 DIAGNOSIS — L501 Idiopathic urticaria: Secondary | ICD-10-CM | POA: Diagnosis not present

## 2020-04-23 LAB — PAP IG W/ RFLX HPV ASCU

## 2020-04-23 LAB — C. TRACHOMATIS/N. GONORRHOEAE RNA
C. trachomatis RNA, TMA: NOT DETECTED
N. gonorrhoeae RNA, TMA: NOT DETECTED

## 2020-04-24 ENCOUNTER — Ambulatory Visit: Payer: 59 | Admitting: Psychology

## 2020-05-08 ENCOUNTER — Ambulatory Visit (INDEPENDENT_AMBULATORY_CARE_PROVIDER_SITE_OTHER): Payer: 59 | Admitting: Psychology

## 2020-05-08 DIAGNOSIS — F411 Generalized anxiety disorder: Secondary | ICD-10-CM

## 2020-05-08 DIAGNOSIS — F331 Major depressive disorder, recurrent, moderate: Secondary | ICD-10-CM

## 2020-05-21 ENCOUNTER — Ambulatory Visit (INDEPENDENT_AMBULATORY_CARE_PROVIDER_SITE_OTHER): Payer: 59 | Admitting: *Deleted

## 2020-05-21 ENCOUNTER — Other Ambulatory Visit: Payer: Self-pay

## 2020-05-21 DIAGNOSIS — L501 Idiopathic urticaria: Secondary | ICD-10-CM

## 2020-05-29 ENCOUNTER — Ambulatory Visit (INDEPENDENT_AMBULATORY_CARE_PROVIDER_SITE_OTHER): Payer: 59 | Admitting: Psychology

## 2020-05-29 DIAGNOSIS — F411 Generalized anxiety disorder: Secondary | ICD-10-CM

## 2020-05-29 DIAGNOSIS — F331 Major depressive disorder, recurrent, moderate: Secondary | ICD-10-CM

## 2020-05-30 ENCOUNTER — Encounter (HOSPITAL_COMMUNITY): Payer: Self-pay | Admitting: Psychiatry

## 2020-05-30 ENCOUNTER — Telehealth (INDEPENDENT_AMBULATORY_CARE_PROVIDER_SITE_OTHER): Payer: 59 | Admitting: Psychiatry

## 2020-05-30 ENCOUNTER — Other Ambulatory Visit: Payer: Self-pay

## 2020-05-30 DIAGNOSIS — F410 Panic disorder [episodic paroxysmal anxiety] without agoraphobia: Secondary | ICD-10-CM

## 2020-05-30 DIAGNOSIS — F3341 Major depressive disorder, recurrent, in partial remission: Secondary | ICD-10-CM | POA: Diagnosis not present

## 2020-05-30 DIAGNOSIS — F4310 Post-traumatic stress disorder, unspecified: Secondary | ICD-10-CM

## 2020-05-30 MED ORDER — DULOXETINE HCL 60 MG PO CPEP: 60.0000 mg | ORAL_CAPSULE | Freq: Two times a day (BID) | ORAL | 0 refills | Status: DC

## 2020-05-30 MED ORDER — QUETIAPINE FUMARATE 50 MG PO TABS
ORAL_TABLET | ORAL | 2 refills | Status: DC
Start: 2020-05-30 — End: 2020-08-29
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
## 2020-06-18 ENCOUNTER — Ambulatory Visit (INDEPENDENT_AMBULATORY_CARE_PROVIDER_SITE_OTHER): Payer: 59 | Admitting: *Deleted

## 2020-06-18 ENCOUNTER — Other Ambulatory Visit: Payer: Self-pay

## 2020-06-18 DIAGNOSIS — L501 Idiopathic urticaria: Secondary | ICD-10-CM

## 2020-06-19 ENCOUNTER — Other Ambulatory Visit: Payer: Self-pay | Admitting: Allergy

## 2020-06-19 ENCOUNTER — Ambulatory Visit (INDEPENDENT_AMBULATORY_CARE_PROVIDER_SITE_OTHER): Payer: 59 | Admitting: Psychology

## 2020-06-19 DIAGNOSIS — F411 Generalized anxiety disorder: Secondary | ICD-10-CM

## 2020-06-19 DIAGNOSIS — F331 Major depressive disorder, recurrent, moderate: Secondary | ICD-10-CM

## 2020-06-25 ENCOUNTER — Telehealth: Payer: Self-pay | Admitting: Allergy

## 2020-06-25 NOTE — Telephone Encounter (Signed)
Pharmacy called to let office know that pt has not received an epipen in the last year & as she is taking Arvid Right she needs one.   Please advise.

## 2020-07-09 ENCOUNTER — Ambulatory Visit (INDEPENDENT_AMBULATORY_CARE_PROVIDER_SITE_OTHER): Payer: 59 | Admitting: Allergy

## 2020-07-09 ENCOUNTER — Encounter: Payer: Self-pay | Admitting: Allergy

## 2020-07-09 ENCOUNTER — Other Ambulatory Visit: Payer: Self-pay

## 2020-07-09 VITALS — BP 118/62 | HR 68 | Temp 97.9°F | Resp 18

## 2020-07-09 DIAGNOSIS — J302 Other seasonal allergic rhinitis: Secondary | ICD-10-CM | POA: Diagnosis not present

## 2020-07-09 DIAGNOSIS — L239 Allergic contact dermatitis, unspecified cause: Secondary | ICD-10-CM | POA: Diagnosis not present

## 2020-07-09 DIAGNOSIS — H1013 Acute atopic conjunctivitis, bilateral: Secondary | ICD-10-CM

## 2020-07-09 DIAGNOSIS — H1010 Acute atopic conjunctivitis, unspecified eye: Secondary | ICD-10-CM

## 2020-07-09 DIAGNOSIS — L501 Idiopathic urticaria: Secondary | ICD-10-CM

## 2020-07-09 DIAGNOSIS — J3089 Other allergic rhinitis: Secondary | ICD-10-CM

## 2020-07-09 MED ORDER — EPINEPHRINE 0.3 MG/0.3ML IJ SOAJ: 0.3000 mg | INTRAMUSCULAR | 1 refills | Status: AC | PRN

## 2020-07-09 NOTE — Progress Notes (Signed)
Follow Up Note  RE: Brittany [name] MRN: [id number] DOB: 1965-07-04 Date of Office Visit: 07/09/2020  Referring provider: [name],* Primary care provider: [name], Van  Chief Complaint: Follow-up  History of Present Illness: I had the pleasure of seeing Brittany [name] for a follow up visit at the [facility] on 07/09/2020. She is a 55 y.o. female, who is being followed for CIU on Xolair 300 mg every 3.5 weeks and allergic rhinoconjunctivitis. Her previous allergy office visit was on 02/29/2020 with Dr. Maudie Mercury. Today is a regular follow up visit.  Chronic idiopathic urticaria Currently on Xolair 368m every 4 weeks. No more outbreaks.   Patient is taking zyrtec 237min the morning, 1027mn the evening. Tried to wean down to BID but had some rhinitis symptoms. Still taking famotidine 48m50mice a day.   Not needed to use hydroxyzine. No itching and no rash.   Seasonal and perennial allergic rhinoconjunctivitis Stable with the antihistamines.  Still taking Singulair at night. Using Flonase only if needed.   Rash On the eyelids with no triggers noted. Changed to new make up. Using OTC hydrocortisone with good benefit.  Assessment and Plan: Brittany [name] is a 54 y51. female with: Chronic idiopathic urticaria Past history - 2020 blood work was unremarkable for CBC diff, alpha gal, ANA, thyroid, CU index, H. pylori test, tryptase.  ESR was slightly elevated. Triggers include stress. Interim history - Unable to wean off antihistamines as it worsened her allergy symptoms. No hive outbreaks.   Continue with zyrtec 48mg63mthe morning and 10mg 43mhe evening.  DECREASE famotidine 48mg t52mce a day Weaning schedule:  Take zyrtec 10mg in36m morning, 48mg in 22mevening + famotidine 48mg once5may. If no hives for 2-3 weeks:  Take zyrtec 10mg twice45may + famotidine 48mg once a84m. If no hives for 2-3 weeks:  Take zyrtec 10mg twice a74m and stop  famotidine.   Continue with Xolair injections 300mg every 4 43ms.  May use hydroxyzine 25mg every 8 h64m for breakthrough symptoms.   Avoid the following potential triggers: alcohol, tight clothing, NSAIDs, heat.  Seasonal and perennial allergic rhinoconjunctivitis Past history - 2020 skin testing was positive to ragweed, weed, dust mite. Interim history - stable.  Continue environmental control measures.  Continue antihistamines as above.  Continue Singulair (montelukast) 10 mg daily at night.   May use pataday 1 drop in each as daily as needed for itchy/watery eyes.  May use Flonase (fluticasone) nasal spray 1 spray per nostril twice a day as needed for nasal congestion.   Nasal saline spray (i.e., Simply Saline) or nasal saline lavage (i.e., NeilMed) is recommended as needed and prior to medicated nasal sprays.  Allergic contact dermatitis Most likely having contact dermatitis on the eyelids.  Keep track of the rash and take pictures.  If this continues to happen then recommend patch testing next.  See below for proper skin care.   May use OTC hydrocortisone prn.  Return in about 4 months (around 11/08/2020).  Meds ordered this encounter  Medications  . EPINEPHrine (EPIPEN 2-PAK) 0.3 mg/0.3 mL IJ SOAJ injection    Sig: Inject 0.3 mg into the muscle as needed for anaphylaxis.    Dispense:  0.3 mL    Refill:  1   Lab Orders  No laboratory test(s) ordered today    Diagnostics: None.  Medication List:  Current Outpatient Medications  Medication Sig Dispense  Refill  . acetaminophen (TYLENOL) 650 MG CR tablet Take 2,600 mg by mouth once.    . calcium-vitamin D (OSCAL WITH D) 500-200 MG-UNIT tablet Take 3 tablets by mouth every morning. For bone health (Patient taking differently: Take 3 tablets by mouth every evening. For bone health)    . cetirizine (ZYRTEC) 10 MG tablet Take 20 mg by mouth 2 (two) times daily.    . cholecalciferol (VITAMIN D) 1000 units  tablet Take 1 tablet (1,000 Units total) by mouth daily. For bone health    . DULoxetine (CYMBALTA) 60 MG capsule Take 1 capsule (60 mg total) by mouth 2 (two) times daily. 180 capsule 0  . EPINEPHrine (EPIPEN 2-PAK) 0.3 mg/0.3 mL IJ SOAJ injection Inject 0.3 mg into the muscle as needed for anaphylaxis. 0.3 mL 1  . EPINEPHrine 0.3 mg/0.3 mL IJ SOAJ injection Inject 0.3 mLs (0.3 mg total) into the muscle as needed for anaphylaxis. 1 each 1  . famotidine (PEPCID) 20 MG tablet Take 1 tablet (20 mg total) by mouth 2 (two) times daily as needed for heartburn or indigestion. 60 tablet 5  . hydrOXYzine (ATARAX/VISTARIL) 25 MG tablet TAKE 1 TABLET BY MOUTH THREE TIMES DAILY AS NEEDED 90 tablet 0  . LORazepam (ATIVAN) 0.5 MG tablet TAKE 1 TABLET(0.5 MG) BY MOUTH DAILY AS NEEDED FOR ANXIETY 20 tablet 0  . montelukast (SINGULAIR) 10 MG tablet TAKE 1 TABLET(10 MG) BY MOUTH AT BEDTIME 30 tablet 5  . Multiple Vitamin (MULTI-VITAMIN) tablet Take by mouth.    Marland Kitchen PATADAY 0.2 % SOLN Place 1 drop into both eyes daily as needed. 2.5 mL 5  . QUEtiapine (SEROQUEL) 50 MG tablet TAKE 3 TABLETS(150 MG) BY MOUTH AT BEDTIME 90 tablet 2  . vitamin B-12 (CYANOCOBALAMIN) 500 MCG tablet Take 1 tablet (500 mcg total) by mouth daily. For Vitamin B-12 replacement    . vitamin E 1000 UNIT capsule Take 1,000 Units by mouth daily.    Brittany [name] 150 MG injection INJECT 300 MG UNDER THE SKIN EVERY 14 DAYS 4 each 11   Current Facility-Administered Medications  Medication Dose Route Frequency Provider Last Rate Last Admin  . omalizumab Brittany [name]) injection 300 mg  300 mg Subcutaneous Q28 days Garnet Sierras, DO   300 mg at 06/18/20 1359   Allergies: Allergies  Allergen Reactions  . Contrast Media [Iodinated Diagnostic Agents] Hives  . Amoxicillin Hives    .Marland KitchenHas patient had a PCN reaction causing immediate rash, facial/tongue/throat swelling, SOB or lightheadedness with hypotension: Yes Has patient had a PCN reaction causing severe rash  involving mucus membranes or skin necrosis: No Has patient had a PCN reaction that required hospitalization No Has patient had a PCN reaction occurring within the last 10 years: No If all of the above answers are "NO", then may proceed with Cephalosporin use.   . Bactrim [Sulfamethoxazole-Trimethoprim] Hives and Rash  . Lamictal [Lamotrigine] Hives and Rash   I reviewed her past medical history, social history, family history, and environmental history and no significant changes have been reported from her previous visit.  Review of Systems  Constitutional: Negative for appetite change, chills, fatigue, fever and unexpected weight change.  HENT: Negative for congestion, rhinorrhea, sinus pressure and sneezing.   Eyes: Negative for pain and itching.  Respiratory: Negative for cough, chest tightness, shortness of breath and wheezing.   Gastrointestinal: Negative for abdominal pain.  Skin: Negative for rash.  Allergic/Immunologic: Positive for environmental allergies.  Neurological: Negative for headaches.   Objective:  BP 118/62 (BP Location: Left Arm, Patient Position: Sitting, Cuff Size: Large)   Pulse 68   Temp 97.9 F (36.6 C) (Temporal)   Resp 18   SpO2 97%  There is no height or weight on file to calculate BMI. Physical Exam Vitals and nursing note reviewed.  Constitutional:      Appearance: Normal appearance.  HENT:     Head: Normocephalic and atraumatic.     [name] Ear: Tympanic membrane, ear canal and external ear normal.     Left Ear: Tympanic membrane, ear canal and external ear normal.     Nose: Nose normal.     Mouth/Throat:     Mouth: Mucous membranes are moist.     Pharynx: Oropharynx is clear.  Eyes:     Extraocular Movements: Extraocular movements intact.     Conjunctiva/sclera: Conjunctivae normal.     Pupils: Pupils are equal, round, and reactive to light.  Cardiovascular:     Rate and Rhythm: Normal rate and regular rhythm.     Heart sounds: Normal heart  sounds. No murmur heard. No gallop.   Pulmonary:     Effort: Pulmonary effort is normal.     Breath sounds: Normal breath sounds. No wheezing, rhonchi or rales.  Musculoskeletal:        General: No swelling or deformity. Normal range of motion.     Cervical back: Normal range of motion and neck supple.  Skin:    General: Skin is warm and dry.     Findings: No rash.  Neurological:     Mental Status: She is alert and oriented to person, place, and time.  Psychiatric:        Mood and Affect: Mood normal.        Behavior: Behavior normal.        Thought Content: Thought content normal.        Judgment: Judgment normal.    Previous notes and tests were reviewed. The plan was reviewed with the patient/family, and all questions/concerned were addressed.  It was my pleasure to see Brittany [name] today and participate in her care. Please feel free to contact me with any questions or concerns.  Sincerely,  Rexene Alberts, DO Allergy & Immunology  [facility] office: [place] office: [phone]

## 2020-07-09 NOTE — Patient Instructions (Addendum)
Chronic idiopathic urticaria  Continue with zyrtec 20mg  in the morning and 10mg  in the evening.  DECREASE famotidine 20mg  to once a day  Weaning schedule:  Take zyrtec 10mg  in the morning, 20mg  in the evening + famotidine 20mg  once a day. If no hives for 2-3 weeks:  Take zyrtec 10mg  twice a day + famotidine 20mg  once a day. If no hives for 2-3 weeks:  Take zyrtec 10mg  twice a day and stop famotidine.    Continue with Xolair injections 300mg  every 4 weeks.  May use hydroxyzine 25mg  every 8 hours for breakthrough symptoms.   Avoid the following potential triggers: alcohol, tight clothing, NSAIDs, heat.  Seasonal and perennial allergic rhinoconjunctivitis Past history - 2020 skin testing was positive to ragweed, weed, dust mite.  Continue environmental control measures.  Continue antihistamines as above.  Continue Singulair (montelukast) 10 mg daily at night.   May use pataday 1 drop in each as daily as needed for itchy/watery eyes.  May use Flonase (fluticasone) nasal spray 1 spray per nostril twice a day as needed for nasal congestion.   Nasal saline spray (i.e., Simply Saline) or nasal saline lavage (i.e., NeilMed) is recommended as needed and prior to medicated nasal sprays.  Eyelid rash  Keep track of the rash and take pictures.  If this continues to happen then recommend patch testing next.  Patches are best placed on Monday with return to office on Wednesday and Friday of same week for readings.  Patches once placed should not get wet.  You do not have to stop any medications for patch testing but should not be on oral prednisone. You can schedule a patch testing visit when convenient for your schedule.    Follow up in 4 months or sooner if needed.   Reducing Pollen Exposure . Pollen seasons: trees (spring), grass (summer) and ragweed/weeds (fall). Marland Kitchen Keep windows closed in your home and car to lower pollen exposure.  Susa Simmonds air conditioning in the bedroom and  throughout the house if possible.  . Avoid going out in dry windy days - especially early morning. . Pollen counts are highest between 5 - 10 AM and on dry, hot and windy days.  . Save outside activities for late afternoon or after a heavy rain, when pollen levels are lower.  . Avoid mowing of grass if you have grass pollen allergy. Marland Kitchen Be aware that pollen can also be transported indoors on people and pets.  . Dry your clothes in an automatic dryer rather than hanging them outside where they might collect pollen.  . Rinse hair and eyes before bedtime.  Control of House Dust Mite Allergen . Dust mite allergens are a common trigger of allergy and asthma symptoms. While they can be found throughout the house, these microscopic creatures thrive in warm, humid environments such as bedding, upholstered furniture and carpeting. . Because so much time is spent in the bedroom, it is essential to reduce mite levels there.  . Encase pillows, mattresses, and box springs in special allergen-proof fabric covers or airtight, zippered plastic covers.  . Bedding should be washed weekly in hot water (130 F) and dried in a hot dryer. Allergen-proof covers are available for comforters and pillows that can't be regularly washed.  Wendee Copp the allergy-proof covers every few months. Minimize clutter in the bedroom. Keep pets out of the bedroom.  Marland Kitchen Keep humidity less than 50% by using a dehumidifier or air conditioning. You can buy a humidity measuring device called  a hygrometer to monitor this.  . If possible, replace carpets with hardwood, linoleum, or washable area rugs. If that's not possible, vacuum frequently with a vacuum that has a HEPA filter. . Remove all upholstered furniture and non-washable window drapes from the bedroom. . Remove all non-washable stuffed toys from the bedroom.  Wash stuffed toys weekly.  Skin care recommendations  Bath time: . Always use lukewarm water. AVOID very hot or cold  water. Marland Kitchen Keep bathing time to 5-10 minutes. . Do NOT use bubble bath. . Use a mild soap and use just enough to wash the dirty areas. . Do NOT scrub skin vigorously.  . After bathing, pat dry your skin with a towel. Do NOT rub or scrub the skin.  Moisturizers and prescriptions:  . ALWAYS apply moisturizers immediately after bathing (within 3 minutes). This helps to lock-in moisture. . Use the moisturizer several times a day over the whole body. Kermit Balo summer moisturizers include: Aveeno, CeraVe, Cetaphil. Kermit Balo winter moisturizers include: Aquaphor, Vaseline, Cerave, Cetaphil, Eucerin, Vanicream. . When using moisturizers along with medications, the moisturizer should be applied about one hour after applying the medication to prevent diluting effect of the medication or moisturize around where you applied the medications. When not using medications, the moisturizer can be continued twice daily as maintenance.  Laundry and clothing: . Avoid laundry products with added color or perfumes. . Use unscented hypo-allergenic laundry products such as Tide free, Cheer free & gentle, and All free and clear.  . If the skin still seems dry or sensitive, you can try double-rinsing the clothes. . Avoid tight or scratchy clothing such as wool. . Do not use fabric softeners or dyer sheets.  True Test looks for the following sensitivities:

## 2020-07-09 NOTE — Assessment & Plan Note (Signed)
Past history - 2020 blood work was unremarkable for CBC diff, alpha gal, ANA, thyroid, CU index, H. pylori test, tryptase.  ESR was slightly elevated. Triggers include stress. Interim history - Unable to wean off antihistamines as it worsened her allergy symptoms. No hive outbreaks.   Continue with zyrtec 31m in the morning and 153min the evening.  DECREASE famotidine 2053mo once a day Weaning schedule:  Take zyrtec 51m65m the morning, 20mg37mthe evening + famotidine 20mg 65m a day. If no hives for 2-3 weeks:  Take zyrtec 51mg t63m a day + famotidine 20mg on54m day. If no hives for 2-3 weeks:  Take zyrtec 51mg twi44m day and stop famotidine.   Continue with Xolair injections 300mg ever51mweeks.  May use hydroxyzine 25mg every17mours for breakthrough symptoms.   Avoid the following potential triggers: alcohol, tight clothing, NSAIDs, heat.

## 2020-07-09 NOTE — Assessment & Plan Note (Addendum)
Most likely having contact dermatitis on the eyelids.  Keep track of the rash and take pictures.  If this continues to happen then recommend patch testing next.  See below for proper skin care.   May use OTC hydrocortisone prn.

## 2020-07-09 NOTE — Assessment & Plan Note (Signed)
Past history - 2020 skin testing was positive to ragweed, weed, dust mite. Interim history - stable.  Continue environmental control measures.  Continue antihistamines as above.  Continue Singulair (montelukast) 10 mg daily at night.   May use pataday 1 drop in each as daily as needed for itchy/watery eyes.  May use Flonase (fluticasone) nasal spray 1 spray per nostril twice a day as needed for nasal congestion.   Nasal saline spray (i.e., Simply Saline) or nasal saline lavage (i.e., NeilMed) is recommended as needed and prior to medicated nasal sprays.

## 2020-07-10 ENCOUNTER — Ambulatory Visit (INDEPENDENT_AMBULATORY_CARE_PROVIDER_SITE_OTHER): Payer: 59 | Admitting: Psychology

## 2020-07-10 DIAGNOSIS — F331 Major depressive disorder, recurrent, moderate: Secondary | ICD-10-CM | POA: Diagnosis not present

## 2020-07-10 DIAGNOSIS — F411 Generalized anxiety disorder: Secondary | ICD-10-CM | POA: Diagnosis not present

## 2020-07-16 ENCOUNTER — Other Ambulatory Visit: Payer: Self-pay

## 2020-07-16 ENCOUNTER — Ambulatory Visit (INDEPENDENT_AMBULATORY_CARE_PROVIDER_SITE_OTHER): Payer: 59 | Admitting: *Deleted

## 2020-07-16 DIAGNOSIS — L501 Idiopathic urticaria: Secondary | ICD-10-CM

## 2020-08-02 ENCOUNTER — Ambulatory Visit (INDEPENDENT_AMBULATORY_CARE_PROVIDER_SITE_OTHER): Payer: 59 | Admitting: Psychology

## 2020-08-02 DIAGNOSIS — F331 Major depressive disorder, recurrent, moderate: Secondary | ICD-10-CM | POA: Diagnosis not present

## 2020-08-02 DIAGNOSIS — F411 Generalized anxiety disorder: Secondary | ICD-10-CM | POA: Diagnosis not present

## 2020-08-09 ENCOUNTER — Encounter: Payer: Self-pay | Admitting: Family

## 2020-08-09 ENCOUNTER — Ambulatory Visit (INDEPENDENT_AMBULATORY_CARE_PROVIDER_SITE_OTHER): Payer: 59 | Admitting: Family

## 2020-08-09 ENCOUNTER — Other Ambulatory Visit: Payer: Self-pay | Admitting: Family

## 2020-08-09 ENCOUNTER — Other Ambulatory Visit: Payer: Self-pay

## 2020-08-09 VITALS — BP 130/90 | HR 61 | Temp 98.1°F | Ht 69.0 in | Wt 268.4 lb

## 2020-08-09 DIAGNOSIS — Z0000 Encounter for general adult medical examination without abnormal findings: Secondary | ICD-10-CM | POA: Diagnosis not present

## 2020-08-09 DIAGNOSIS — Z13220 Encounter for screening for lipoid disorders: Secondary | ICD-10-CM | POA: Diagnosis not present

## 2020-08-09 DIAGNOSIS — R7989 Other specified abnormal findings of blood chemistry: Secondary | ICD-10-CM

## 2020-08-09 DIAGNOSIS — G4730 Sleep apnea, unspecified: Secondary | ICD-10-CM

## 2020-08-09 DIAGNOSIS — R002 Palpitations: Secondary | ICD-10-CM | POA: Diagnosis not present

## 2020-08-09 LAB — COMPREHENSIVE METABOLIC PANEL
ALT: 65 U/L — ABNORMAL HIGH (ref 0–35)
AST: 21 U/L (ref 0–37)
Albumin: 4.1 g/dL (ref 3.5–5.2)
Alkaline Phosphatase: 203 U/L — ABNORMAL HIGH (ref 39–117)
BUN: 15 mg/dL (ref 6–23)
CO2: 25 mEq/L (ref 19–32)
Calcium: 9.5 mg/dL (ref 8.4–10.5)
Chloride: 105 mEq/L (ref 96–112)
Creatinine, Ser: 0.64 mg/dL (ref 0.40–1.20)
GFR: 99.38 mL/min (ref >60.00)
Glucose, Bld: 100 mg/dL — ABNORMAL HIGH (ref 70–99)
Potassium: 4.3 mEq/L (ref 3.5–5.1)
Sodium: 140 mEq/L (ref 135–145)
Total Bilirubin: 0.5 mg/dL (ref 0.2–1.2)
Total Protein: 7 g/dL (ref 6.0–8.3)

## 2020-08-09 LAB — CBC WITH DIFFERENTIAL/PLATELET
Basophils Absolute: 0 10*3/uL (ref 0.0–0.1)
Basophils Relative: 0.8 % (ref 0.0–3.0)
Eosinophils Absolute: 0.1 10*3/uL (ref 0.0–0.7)
Eosinophils Relative: 2.5 % (ref 0.0–5.0)
HCT: 40.8 % (ref 36.0–46.0)
Hemoglobin: 13.9 g/dL (ref 12.0–15.0)
Lymphocytes Relative: 26.2 % (ref 12.0–46.0)
Lymphs Abs: 1.5 10*3/uL (ref 0.7–4.0)
MCHC: 34.1 g/dL (ref 30.0–36.0)
MCV: 99 fl (ref 78.0–100.0)
Monocytes Absolute: 0.3 10*3/uL (ref 0.1–1.0)
Monocytes Relative: 6 % (ref 3.0–12.0)
Neutro Abs: 3.6 10*3/uL (ref 1.4–7.7)
Neutrophils Relative %: 64.5 % (ref 43.0–77.0)
Platelets: 365 10*3/uL (ref 150.0–400.0)
RBC: 4.12 Mil/uL (ref 3.87–5.11)
RDW: 12.9 % (ref 11.5–15.5)
WBC: 5.6 10*3/uL (ref 4.0–10.5)

## 2020-08-09 LAB — TSH: TSH: 2.18 u[IU]/mL (ref 0.35–4.50)

## 2020-08-09 LAB — LIPID PANEL
Cholesterol: 203 mg/dL — ABNORMAL HIGH (ref 0–200)
HDL: 65.7 mg/dL (ref >39.00)
LDL Cholesterol: 116 mg/dL — ABNORMAL HIGH (ref 0–99)
NonHDL: 137.07
Total CHOL/HDL Ratio: 3
Triglycerides: 107 mg/dL (ref 0.0–149.0)
VLDL: 21.4 mg/dL (ref 0.0–40.0)

## 2020-08-09 MED ORDER — METOPROLOL SUCCINATE ER 50 MG PO TB24
50.0000 mg | ORAL_TABLET | Freq: Every day | ORAL | 0 refills | Status: DC
Start: 2020-08-09 — End: 2020-08-30

## 2020-08-09 NOTE — Progress Notes (Signed)
Brittany [name] is a 55 y.o. female with the following history as recorded in EpicCare:  Patient Active Problem List   Diagnosis Date Noted  . Allergic contact dermatitis 07/09/2020  . Chronic idiopathic urticaria 01/17/2019  . Seasonal and perennial allergic rhinoconjunctivitis 11/08/2018  . Allergic reaction 11/08/2018  . Diarrhea 11/08/2018  . Right thigh pain 05/19/2018  . Melena   . Acute gastrojejunal anastomotic ulcer   . Macrocytic anemia   . Acute GI bleeding 09/28/2017  . Epigastric abdominal pain 11/19/2016  . Incisional hernia with obstruction but no gangrene 07/21/2016  . Menopause 04/22/2016  . Routine general medical examination at a health care facility 03/18/2016  . Obesity 03/18/2016  . Severe episode of recurrent major depressive disorder, without psychotic features ([place])   . Depression, major, severe recurrence ([place]) 09/11/2015  . Major depressive disorder, recurrent, severe without psychotic features ([place])   . MDD (major depressive disorder) 10/30/2014  . MDD (major depressive disorder), recurrent severe, without psychosis ([place])   . Suicidal ideation 10/05/2014  . Depression 06/12/2014  . Rash 01/04/2014  . Normocytic anemia 01/04/2014  . DJD (degenerative joint disease) 01/04/2014  . Hypertension 01/04/2014  . Dyslipidemia 01/04/2014  . GERD (gastroesophageal reflux disease) 01/04/2014  . Obstructive sleep apnea 01/04/2014  . Status post small bowel resection 01/04/2014  . Abdominal abscess 01/01/2014  . Wound infection after surgery 01/01/2014  . PTSD (post-traumatic stress disorder) 11/23/2013  . Severe recurrent major depression without psychotic features ([place]) 11/22/2013  . 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 Morbid obesity, Weight - 312, BMI - 45.2 04/21/2012    Current Outpatient Medications  Medication Sig Dispense Refill  . acetaminophen (TYLENOL) 650 MG CR tablet Take 2,600 mg by mouth once.    . calcium-vitamin D  (OSCAL WITH D) 500-200 MG-UNIT tablet Take 3 tablets by mouth every morning. For bone health (Patient taking differently: Take 3 tablets by mouth every evening. For bone health)    . cetirizine (ZYRTEC) 10 MG tablet Take 20 mg by mouth 2 (two) times daily.    . cholecalciferol (VITAMIN D) 1000 units tablet Take 1 tablet (1,000 Units total) by mouth daily. For bone health    . DULoxetine (CYMBALTA) 60 MG capsule Take 1 capsule (60 mg total) by mouth 2 (two) times daily. 180 capsule 0  . EPINEPHrine (EPIPEN 2-PAK) 0.3 mg/0.3 mL IJ SOAJ injection Inject 0.3 mg into the muscle as needed for anaphylaxis. 0.3 mL 1  . famotidine (PEPCID) 20 MG tablet Take 1 tablet (20 mg total) by mouth 2 (two) times daily as needed for heartburn or indigestion. 60 tablet 5  . hydrOXYzine (ATARAX/VISTARIL) 25 MG tablet TAKE 1 TABLET BY MOUTH THREE TIMES DAILY AS NEEDED 90 tablet 0  . LORazepam (ATIVAN) 0.5 MG tablet TAKE 1 TABLET(0.5 MG) BY MOUTH DAILY AS NEEDED FOR ANXIETY 20 tablet 0  . metoprolol succinate (TOPROL-XL) 50 MG 24 hr tablet Take 1 tablet (50 mg total) by mouth daily. Take with or immediately following a meal. 90 tablet 0  . montelukast (SINGULAIR) 10 MG tablet TAKE 1 TABLET(10 MG) BY MOUTH AT BEDTIME 30 tablet 5  . Multiple Vitamin (MULTI-VITAMIN) tablet Take by mouth.    . QUEtiapine (SEROQUEL) 50 MG tablet TAKE 3 TABLETS(150 MG) BY MOUTH AT BEDTIME 90 tablet 2  . vitamin B-12 (CYANOCOBALAMIN) 500 MCG tablet Take 1 tablet (500 mcg total) by mouth daily. For Vitamin B-12 replacement    . vitamin E 1000 UNIT capsule  Take 1,000 Units by mouth daily.    Arvid Right 150 MG injection INJECT 300 MG UNDER THE SKIN EVERY 14 DAYS 4 each 11   Current Facility-Administered Medications  Medication Dose Route Frequency Provider Last Rate Last Admin  . omalizumab Arvid Right) injection 300 mg  300 mg Subcutaneous Q28 days Garnet Sierras, DO   300 mg at 07/16/20 1023    Allergies: Contrast media [iodinated diagnostic agents],  Amoxicillin, Bactrim [sulfamethoxazole-trimethoprim], and Lamictal [lamotrigine]  Past Medical History:  Diagnosis Date  . Allergy   . Angio-edema   . Anxiety   . Arthritis    hips, knees and hands   . Blood transfusion without reported diagnosis   . Depression   . GERD (gastroesophageal reflux disease)    hx of   . H/O hiatal hernia   . History of chicken pox   . Hypercholesterolemia   . Hypertension   . Migraine   . Migraines   . Morbid obesity ([place])   . Positive TB test   . PTSD (post-traumatic stress disorder)   . Sleep apnea    no CPAP machine   . Ulcer, stomach peptic   . Urticaria     Past Surgical History:  Procedure Laterality Date  . ABDOMINAL HYSTERECTOMY  1997 & 2006  . BREATH TEK H PYLORI  05/03/2012   Procedure: BREATH TEK H PYLORI;  Surgeon: [name], MD;  Location: [name];  Service: General;  Laterality: N/A;  . COLONOSCOPY     hx of benign polyps   . ESOPHAGOGASTRODUODENOSCOPY N/A 09/29/2017   Procedure: ESOPHAGOGASTRODUODENOSCOPY (EGD);  Surgeon: Ladene Artist, MD;  Location: [name];  Service: Endoscopy;  Laterality: N/A;  . FOOT SURGERY    . GASTRIC BYPASS    . GASTRIC ROUX-EN-Y N/A 07/05/2012   Procedure: LAPAROSCOPIC ROUX-EN-Y GASTRIC;  Surgeon: [name], MD;  Location: [facility];  Service: General;  Laterality: N/A;  . IRRIGATION AND DEBRIDEMENT ABSCESS N/A 01/01/2014   Procedure: IRRIGATION AND DEBRIDEMENT ABSCESS;  Surgeon: [name], MD;  Location: [facility];  Service: General;  Laterality: N/A;  . LAPAROSCOPIC LYSIS OF ADHESIONS N/A 07/05/2012   Procedure: LAPAROSCOPIC LYSIS OF ADHESIONS;  Surgeon: [name], MD;  Location: [facility];  Service: General;  Laterality: N/A;  repair of abdominal wall hernia  . right tube and ovary removed     . UPPER GI ENDOSCOPY N/A 07/05/2012   Procedure: UPPER GI ENDOSCOPY;  Surgeon: [name], MD;  Location: [facility];  Service: General;  Laterality: N/A;    Family History  Problem  Relation Age of Onset  . Colon cancer Mother 43  . Depression Father   . Diabetes Father   . Heart disease Father   . Bladder Cancer Father   . Depression Sister   . Post-traumatic stress disorder Sister   . Alcohol abuse Paternal Uncle   . Bipolar disorder Cousin   . Anxiety disorder Sister   . Eczema Sister   . Cancer Maternal Aunt        breast  . Breast cancer Maternal Aunt   . Melanoma Maternal Grandmother   . Colon cancer Maternal Grandmother        does not know age of onset  . Healthy Maternal Grandfather   . Healthy Paternal Grandmother   . Heart disease Paternal Grandfather   . Stroke Paternal Grandfather   . Diabetes Paternal Grandfather     Social History   Tobacco Use  .  Smoking status: Former Smoker    Packs/day: 0.10    Years: 1.00    Pack years: 0.10    Types: Cigarettes    Quit date: 04/27/2004    Years since quitting: 16.2  . Smokeless tobacco: Never Used  Substance Use Topics  . Alcohol use: Yes    Alcohol/week: 0.0 standard drinks    Comment: Occasional use/ 2 drinks a month    Subjective:  Patient presents for yearly CPE; does have GYN; Sees allergist and psychiatrist regularly;   Has noticed episodes of her "heart racing" at night- wakes her up out of sleep; no chest pain or shortness of breath on exertion; notes she has done a sleep study in the past- was told she had sleep apnea but did not do well with CPAP machine; unsure of how long ago test was done- 15+ years;  FH of CAD; father had CHF/ LVAD x 6 years;    Review of Systems  Constitutional: Negative.   HENT: Negative.   Eyes: Negative.   Respiratory: Negative.   Cardiovascular: Positive for palpitations.  Gastrointestinal: Negative.   Genitourinary: Negative.   Musculoskeletal: Negative.   Skin: Negative.   Neurological: Negative.   Endo/Heme/Allergies: Negative.   Psychiatric/Behavioral: Positive for depression.     Objective:  Vitals:   08/09/20 0819  BP: 130/90  Pulse:  61  Temp: 98.1 F (36.7 C)  TempSrc: Oral  SpO2: 94%  Weight: 268 lb 6.4 oz (121.7 kg)  Height: '5\' 9"'  (1.753 m)    General: Well developed, well nourished, in no acute distress  Skin : Warm and dry.  Head: Normocephalic and atraumatic  Eyes: Sclera and conjunctiva clear; pupils round and reactive to light; extraocular movements intact  Ears: External normal; canals clear; tympanic membranes normal  Oropharynx: Pink, supple. No suspicious lesions  Neck: Supple without thyromegaly, adenopathy  Lungs: Respirations unlabored; clear to auscultation bilaterally without wheeze, rales, rhonchi  CVS exam: normal rate, regular rhythm, normal S1, S2, no murmurs, rubs, clicks or gallops, normal rate and regular rhythm.  Abdomen: Soft; nontender; nondistended; normoactive bowel sounds; no masses or hepatosplenomegaly  Musculoskeletal: No deformities; no active joint inflammation  Extremities: No edema, cyanosis, clubbing  Vessels: Symmetric bilaterally  Neurologic: Alert and oriented; speech intact; face symmetrical; moves all extremities well; CNII-XII intact without focal deficit   Assessment:  1. PE (physical exam), annual   2. Palpitations   3. Sleep apnea, unspecified type   4. Lipid screening     Plan:  Age appropriate preventive healthcare needs addressed; encouraged regular eye doctor and dental exams; encouraged regular exercise; will update labs and refills as needed today; follow-up to be determined; Check EKG- NSR; trial of Toprol XL 50 mg until cardiology evaluation done- may need different medication or discontinue completely; refer to cardiology; Refer to neurology for sleep study- suspect sleep apnea is source of symptoms; discussed progress in CPAP treatments; Follow-up to be determined;  This visit occurred during the SARS-CoV-2 public health emergency.  Safety protocols were in place, including screening questions prior to the visit, additional usage of staff PPE, and  extensive cleaning of exam room while observing appropriate contact time as indicated for disinfecting solutions.      No follow-ups on file.  Orders Placed This Encounter  Procedures  . CBC with Differential/Platelet  . Comp Met (CMET)  . Lipid panel  . TSH  . Ambulatory referral to Cardiology    Referral Priority:   Routine    Referral  Type:   Consultation    Referral Reason:   Specialty Services Required    Requested Specialty:   Cardiology    Number of Visits Requested:   1  . Ambulatory referral to Neurology    Referral Priority:   Routine    Referral Type:   Consultation    Referral Reason:   Specialty Services Required    Requested Specialty:   Neurology    Number of Visits Requested:   1  . EKG 12-Lead    Requested Prescriptions   Signed Prescriptions Disp Refills  . metoprolol succinate (TOPROL-XL) 50 MG 24 hr tablet 90 tablet 0    Sig: Take 1 tablet (50 mg total) by mouth daily. Take with or immediately following a meal.

## 2020-08-09 NOTE — Patient Instructions (Signed)

## 2020-08-13 ENCOUNTER — Other Ambulatory Visit: Payer: Self-pay

## 2020-08-13 ENCOUNTER — Ambulatory Visit (INDEPENDENT_AMBULATORY_CARE_PROVIDER_SITE_OTHER): Payer: 59

## 2020-08-13 DIAGNOSIS — L501 Idiopathic urticaria: Secondary | ICD-10-CM | POA: Diagnosis not present

## 2020-08-27 ENCOUNTER — Ambulatory Visit (INDEPENDENT_AMBULATORY_CARE_PROVIDER_SITE_OTHER): Payer: 59 | Admitting: Psychology

## 2020-08-27 DIAGNOSIS — F411 Generalized anxiety disorder: Secondary | ICD-10-CM

## 2020-08-27 DIAGNOSIS — F331 Major depressive disorder, recurrent, moderate: Secondary | ICD-10-CM | POA: Diagnosis not present

## 2020-08-29 ENCOUNTER — Telehealth (INDEPENDENT_AMBULATORY_CARE_PROVIDER_SITE_OTHER): Payer: 59 | Admitting: Psychiatry

## 2020-08-29 ENCOUNTER — Other Ambulatory Visit: Payer: Self-pay

## 2020-08-29 ENCOUNTER — Encounter (HOSPITAL_COMMUNITY): Payer: Self-pay | Admitting: Psychiatry

## 2020-08-29 DIAGNOSIS — Z8719 Personal history of other diseases of the digestive system: Secondary | ICD-10-CM | POA: Insufficient documentation

## 2020-08-29 DIAGNOSIS — K259 Gastric ulcer, unspecified as acute or chronic, without hemorrhage or perforation: Secondary | ICD-10-CM | POA: Insufficient documentation

## 2020-08-29 DIAGNOSIS — F410 Panic disorder [episodic paroxysmal anxiety] without agoraphobia: Secondary | ICD-10-CM

## 2020-08-29 DIAGNOSIS — F4310 Post-traumatic stress disorder, unspecified: Secondary | ICD-10-CM

## 2020-08-29 DIAGNOSIS — M1990 Unspecified osteoarthritis, unspecified site: Secondary | ICD-10-CM | POA: Insufficient documentation

## 2020-08-29 DIAGNOSIS — F419 Anxiety disorder, unspecified: Secondary | ICD-10-CM | POA: Insufficient documentation

## 2020-08-29 DIAGNOSIS — G43909 Migraine, unspecified, not intractable, without status migrainosus: Secondary | ICD-10-CM | POA: Insufficient documentation

## 2020-08-29 DIAGNOSIS — T7840XA Allergy, unspecified, initial encounter: Secondary | ICD-10-CM | POA: Insufficient documentation

## 2020-08-29 DIAGNOSIS — F3341 Major depressive disorder, recurrent, in partial remission: Secondary | ICD-10-CM

## 2020-08-29 DIAGNOSIS — E7800 Pure hypercholesterolemia, unspecified: Secondary | ICD-10-CM | POA: Insufficient documentation

## 2020-08-29 DIAGNOSIS — R7611 Nonspecific reaction to tuberculin skin test without active tuberculosis: Secondary | ICD-10-CM | POA: Insufficient documentation

## 2020-08-29 DIAGNOSIS — G4730 Sleep apnea, unspecified: Secondary | ICD-10-CM | POA: Insufficient documentation

## 2020-08-29 DIAGNOSIS — Z8619 Personal history of other infectious and parasitic diseases: Secondary | ICD-10-CM | POA: Insufficient documentation

## 2020-08-29 DIAGNOSIS — Z5189 Encounter for other specified aftercare: Secondary | ICD-10-CM | POA: Insufficient documentation

## 2020-08-29 DIAGNOSIS — L509 Urticaria, unspecified: Secondary | ICD-10-CM | POA: Insufficient documentation

## 2020-08-29 DIAGNOSIS — T783XXA Angioneurotic edema, initial encounter: Secondary | ICD-10-CM | POA: Insufficient documentation

## 2020-08-29 MED ORDER — DULOXETINE HCL 60 MG PO CPEP
60.0000 mg | ORAL_CAPSULE | Freq: Two times a day (BID) | ORAL | 0 refills | Status: DC
Start: 2020-08-29 — End: 2020-11-29

## 2020-08-29 MED ORDER — QUETIAPINE FUMARATE 50 MG PO TABS: ORAL_TABLET | ORAL | 2 refills | Status: DC
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
## 2020-08-30 ENCOUNTER — Ambulatory Visit
Admission: RE | Admit: 2020-08-30 | Discharge: 2020-08-30 | Disposition: A | Discharge status: Home / Self Care | Payer: 59 | Source: Ambulatory Visit | Attending: Family | Admitting: Family

## 2020-08-30 ENCOUNTER — Other Ambulatory Visit: Payer: Self-pay | Admitting: Family

## 2020-08-30 DIAGNOSIS — R7989 Other specified abnormal findings of blood chemistry: Secondary | ICD-10-CM

## 2020-08-30 MED ORDER — METOPROLOL SUCCINATE ER 50 MG PO TB24: 50.0000 mg | ORAL_TABLET | Freq: Every day | ORAL | 1 refills | Status: DC

## 2020-08-31 ENCOUNTER — Ambulatory Visit (INDEPENDENT_AMBULATORY_CARE_PROVIDER_SITE_OTHER): Payer: 59 | Admitting: Cardiology

## 2020-08-31 ENCOUNTER — Other Ambulatory Visit: Payer: Self-pay | Admitting: Family

## 2020-08-31 ENCOUNTER — Other Ambulatory Visit: Payer: Self-pay

## 2020-08-31 ENCOUNTER — Encounter: Payer: Self-pay | Admitting: Cardiology

## 2020-08-31 VITALS — BP 120/80 | HR 52 | Ht 69.0 in | Wt 264.0 lb

## 2020-08-31 DIAGNOSIS — R5383 Other fatigue: Secondary | ICD-10-CM | POA: Diagnosis not present

## 2020-08-31 DIAGNOSIS — R001 Bradycardia, unspecified: Secondary | ICD-10-CM

## 2020-08-31 DIAGNOSIS — G4733 Obstructive sleep apnea (adult) (pediatric): Secondary | ICD-10-CM

## 2020-08-31 DIAGNOSIS — K8080 Other cholelithiasis without obstruction: Secondary | ICD-10-CM

## 2020-08-31 DIAGNOSIS — E669 Obesity, unspecified: Secondary | ICD-10-CM

## 2020-08-31 DIAGNOSIS — R002 Palpitations: Secondary | ICD-10-CM | POA: Diagnosis not present

## 2020-08-31 MED ORDER — METOPROLOL SUCCINATE ER 25 MG PO TB24: 25.0000 mg | ORAL_TABLET | Freq: Every day | ORAL | 3 refills | Status: DC

## 2020-08-31 NOTE — Progress Notes (Signed)
Cardiology Office Note:    Date:  08/31/2020   ID:  Brittany [name], DOB Jan 09, 1966, MRN [id number]  PCP:  Brittany Salvage, FNP  Cardiologist:  Brittany Salines, DO  Electrophysiologist:  None   Referring MD: Brittany [name],*   I had episode of palpitations  History of Present Illness:    Brittany [name] is a 55 y.o. female with a hx of obesity, sleep apnea not using CPAP machine of which she cannot after being diagnosed 15 years ago, hypertension is here today today to establish cardiac care.  The patient tells me that she saw her PCP recently during that visit she reported that she had an episode of palpitations 1 night which woke her up from sleep.  She noted that her heart started beating abruptly when fast and resolved after few minutes.  At that time she was started on beta-blocker.  She also reported that she had had some fatigue prior to her episode of heart palpitations.  She denies any chest pain or shortness of breath.   Past Medical History:  Diagnosis Date  . Allergy   . Angio-edema   . Anxiety   . Arthritis    hips, knees and hands   . Blood transfusion without reported diagnosis   . Depression   . GERD (gastroesophageal reflux disease)    hx of   . H/O hiatal hernia   . History of chicken pox   . Hypercholesterolemia   . Hypertension   . Migraine   . Migraines   . Morbid obesity ([place])   . Positive TB test   . PTSD (post-traumatic stress disorder)   . Sleep apnea    no CPAP machine   . Ulcer, stomach peptic   . Urticaria     Past Surgical History:  Procedure Laterality Date  . ABDOMINAL HYSTERECTOMY  1997 & 2006  . BREATH TEK H PYLORI  05/03/2012   Procedure: BREATH TEK H PYLORI;  Surgeon: [name], MD;  Location: [name];  Service: General;  Laterality: N/A;  . COLONOSCOPY     hx of benign polyps   . ESOPHAGOGASTRODUODENOSCOPY N/A 09/29/2017   Procedure: ESOPHAGOGASTRODUODENOSCOPY (EGD);  Surgeon: Ladene Artist, MD;  Location: [name];  Service: Endoscopy;  Laterality: N/A;  . FOOT SURGERY    . GASTRIC BYPASS    . GASTRIC ROUX-EN-Y N/A 07/05/2012   Procedure: LAPAROSCOPIC ROUX-EN-Y GASTRIC;  Surgeon: [name], MD;  Location: [facility];  Service: General;  Laterality: N/A;  . IRRIGATION AND DEBRIDEMENT ABSCESS N/A 01/01/2014   Procedure: IRRIGATION AND DEBRIDEMENT ABSCESS;  Surgeon: [name], MD;  Location: [facility];  Service: General;  Laterality: N/A;  . LAPAROSCOPIC LYSIS OF ADHESIONS N/A 07/05/2012   Procedure: LAPAROSCOPIC LYSIS OF ADHESIONS;  Surgeon: [name], MD;  Location: [facility];  Service: General;  Laterality: N/A;  repair of abdominal wall hernia  . right tube and ovary removed     . UPPER GI ENDOSCOPY N/A 07/05/2012   Procedure: UPPER GI ENDOSCOPY;  Surgeon: [name], MD;  Location: [facility];  Service: General;  Laterality: N/A;    Current Medications: Current Meds  Medication Sig  . calcium-vitamin D (OSCAL WITH D) 500-200 MG-UNIT tablet Take 3 tablets by mouth every morning. For bone health (Patient taking differently: Take 3 tablets by mouth every evening. For bone health)  . cetirizine (ZYRTEC) 10 MG tablet Take 20 mg by mouth 2 (two) times daily.  . cholecalciferol (  VITAMIN D) 1000 units tablet Take 1 tablet (1,000 Units total) by mouth daily. For bone health  . DULoxetine (CYMBALTA) 60 MG capsule Take 1 capsule (60 mg total) by mouth 2 (two) times daily.  Marland Kitchen EPINEPHrine (EPIPEN 2-PAK) 0.3 mg/0.3 mL IJ SOAJ injection Inject 0.3 mg into the muscle as needed for anaphylaxis.  . famotidine (PEPCID) 20 MG tablet Take 1 tablet (20 mg total) by mouth 2 (two) times daily as needed for heartburn or indigestion.  . metoprolol succinate (TOPROL XL) 25 MG 24 hr tablet Take 1 tablet (25 mg total) by mouth daily.  . montelukast (SINGULAIR) 10 MG tablet TAKE 1 TABLET(10 MG) BY MOUTH AT BEDTIME  . Multiple Vitamin (MULTI-VITAMIN) tablet Take by mouth.  . QUEtiapine (SEROQUEL) 50 MG tablet TAKE  3 TABLETS(150 MG) BY MOUTH AT BEDTIME  . vitamin B-12 (CYANOCOBALAMIN) 500 MCG tablet Take 1 tablet (500 mcg total) by mouth daily. For Vitamin B-12 replacement  . vitamin E 1000 UNIT capsule Take 1,000 Units by mouth daily.  Arvid Right 150 MG injection INJECT 300 MG UNDER THE SKIN EVERY 14 DAYS  . [DISCONTINUED] metoprolol succinate (TOPROL-XL) 50 MG 24 hr tablet Take 1 tablet (50 mg total) by mouth daily. Take with or immediately following a meal.   Current Facility-Administered Medications for the 08/31/20 encounter (Office Visit) with Brittany Salines, DO  Medication  . omalizumab Arvid Right) injection 300 mg     Allergies:   Contrast media [iodinated diagnostic agents], Amoxicillin, Bactrim [sulfamethoxazole-trimethoprim], and Lamictal [lamotrigine]   Social History   Socioeconomic History  . Marital status: Divorced    Spouse name: Not on file  . Number of children: 0  . Years of education: 75  . Highest education level: Not on file  Occupational History  . Occupation: Software engineer   Tobacco Use  . Smoking status: Former Smoker    Packs/day: 0.10    Years: 1.00    Pack years: 0.10    Types: Cigarettes    Quit date: 04/27/2004    Years since quitting: 16.3  . Smokeless tobacco: Never Used  Vaping Use  . Vaping Use: Never used  Substance and Sexual Activity  . Alcohol use: Yes    Alcohol/week: 0.0 standard drinks    Comment: Occasional use/ 2 drinks a month  . Drug use: No  . Sexual activity: Yes    Birth control/protection: Surgical    Comment: 1st intercourse 55 yo-More than 5 partners  Other Topics Concern  . Not on file  Social History Narrative   Fun: Color and walk, house renovations   Denies abuse and feels safe at home.    Social Determinants of Health   Financial Resource Strain: Not on file  Food Insecurity: Not on file  Transportation Needs: Not on file  Physical Activity: Not on file  Stress: Not on file  Social Connections: Not on file      Family History: The patient's family history includes Alcohol abuse in her paternal uncle; Anxiety disorder in her sister; Bipolar disorder in her cousin; Bladder Cancer in her father; Breast cancer in her maternal aunt; Cancer in her maternal aunt; Colon cancer in her maternal grandmother; Colon cancer (age of onset: 51) in her mother; Depression in her father and sister; Diabetes in her father and paternal grandfather; Eczema in her sister; Healthy in her maternal grandfather and paternal grandmother; Heart disease in her father and paternal grandfather; Melanoma in her maternal grandmother; Post-traumatic stress disorder in her  sister; Stroke in her paternal grandfather.  ROS:   Review of Systems  Constitution: Negative for decreased appetite, fever and weight gain.  HENT: Negative for congestion, ear discharge, hoarse voice and sore throat.   Eyes: Negative for discharge, redness, vision loss in right eye and visual halos.  Cardiovascular: Recent palpitations however has resolved since being started on beta-blocker.  Negative for chest pain, dyspnea on exertion, leg swelling, orthopnea and  Respiratory: Negative for cough, hemoptysis, shortness of breath and snoring.   Endocrine: Negative for heat intolerance and polyphagia.  Hematologic/Lymphatic: Negative for bleeding problem. Does not bruise/bleed easily.  Skin: Negative for flushing, nail changes, rash and suspicious lesions.  Musculoskeletal: Negative for arthritis, joint pain, muscle cramps, myalgias, neck pain and stiffness.  Gastrointestinal: Negative for abdominal pain, bowel incontinence, diarrhea and excessive appetite.  Genitourinary: Negative for decreased libido, genital sores and incomplete emptying.  Neurological: Negative for brief paralysis, focal weakness, headaches and loss of balance.  Psychiatric/Behavioral: Negative for altered mental status, depression and suicidal ideas.  Allergic/Immunologic: Negative for HIV  exposure and persistent infections.    EKGs/Labs/Other Studies Reviewed:    The following studies were reviewed today:   EKG:  The ekg ordered today demonstrates sinus bradycardia, heart rate 53 beats minute with right-sided interventricular conduction defect.  recent Labs: 08/09/2020: ALT 65; BUN 15; Creatinine, Ser 0.64; Hemoglobin 13.9; Platelets 365.0; Potassium 4.3; Sodium 140; TSH 2.18  Recent Lipid Panel    Component Value Date/Time   CHOL 203 (H) 08/09/2020 0910   TRIG 107.0 08/09/2020 0910   HDL 65.70 08/09/2020 0910   CHOLHDL 3 08/09/2020 0910   VLDL 21.4 08/09/2020 0910   LDLCALC 116 (H) 08/09/2020 0910    Physical Exam:    VS:  BP 120/80 (BP Location: Right Arm)   Pulse (!) 52   Ht 5\' 9"  (1.753 m)   Wt 264 lb (119.7 kg)   SpO2 98%   BMI 38.99 kg/m     Wt Readings from Last 3 Encounters:  08/31/20 264 lb (119.7 kg)  08/09/20 268 lb 6.4 oz (121.7 kg)  04/20/20 273 lb (123.8 kg)     GEN: Well nourished, well developed in no acute distress HEENT: Normal NECK: No JVD; No carotid bruits LYMPHATICS: No lymphadenopathy CARDIAC: S1S2 noted,RRR, no murmurs, rubs, gallops RESPIRATORY:  Clear to auscultation without rales, wheezing or rhonchi  ABDOMEN: Soft, non-tender, non-distended, +bowel sounds, no guarding. EXTREMITIES: No edema, No cyanosis, no clubbing MUSCULOSKELETAL:  No deformity  SKIN: Warm and dry NEUROLOGIC:  Alert and oriented x 3, non-focal PSYCHIATRIC:  Normal affect, good insight  ASSESSMENT:    1. Obstructive sleep apnea   2. Sinus bradycardia   3. Other fatigue   4. Obesity (BMI 30-39.9)   5. Palpitations    PLAN:    This is being started on a beta-blocker metoprolol succinate 50 mg daily palpitations management clinic.  EKG as she is now some bradycardia.  Like to do is cut back a little bit on the beta-blocker to 25 mg only.  Since the symptoms have improved we will hold off on any more labs at this time.  I did educate patient on  Kardia mobile device which will help her with getting prompt EKG if she ever had any more episodes.  For her fatigue although to get an echocardiogram to assess LV function as well as LV function.  I also think her untreated sleep apnea may be playing a role here.  She tells me that  her PCP has set her up for another sleep study which has been scheduled for July.  The patient understands the need to lose weight with diet and exercise. We have discussed specific strategies for this.   The patient is in agreement with the above plan. The patient left the office in stable condition.  The patient will follow up in 3 months or sooner if needed   Medication Adjustments/Labs and Tests Ordered: Current medicines are reviewed at length with the patient today.  Concerns regarding medicines are outlined above.  Orders Placed This Encounter  Procedures  . EKG 12-Lead   Meds ordered this encounter  Medications  . metoprolol succinate (TOPROL XL) 25 MG 24 hr tablet    Sig: Take 1 tablet (25 mg total) by mouth daily.    Dispense:  90 tablet    Refill:  3    Patient Instructions   Medication Instructions:  Your physician has recommended you make the following change in your medication:  DECREASE: Toprol-XL 25 mg once daily *If you need a refill on your cardiac medications before your next appointment, please call your pharmacy*   Lab Work: None If you have labs (blood work) drawn today and your tests are completely normal, you will receive your results only by: Marland Kitchen MyChart Message (if you have MyChart) OR . A paper copy in the mail If you have any lab test that is abnormal or we need to change your treatment, we will call you to review the results.   Testing/Procedures: Your physician has requested that you have an echocardiogram. Echocardiography is a painless test that uses sound waves to create images of your heart. It provides your doctor with information about the size and shape of your  heart and how well your heart's chambers and valves are working. This procedure takes approximately one hour. There are no restrictions for this procedure.     Follow-Up: At [facility], you and your health needs are our priority.  As part of our continuing mission to provide you with exceptional heart care, we have created designated Provider Care Teams.  These Care Teams include your primary Cardiologist (physician) and Advanced Practice Providers (APPs -  Physician Assistants and Nurse Practitioners) who all work together to provide you with the care you need, when you need it.  We recommend signing up for the patient portal called "MyChart".  Sign up information is provided on this After Visit Summary.  MyChart is used to connect with patients for Virtual Visits (Telemedicine).  Patients are able to view lab/test results, encounter notes, upcoming appointments, etc.  Non-urgent messages can be sent to your provider as well.   To learn more about what you can do with MyChart, go to [email or web address].    Your next appointment:   3 month(s)  The format for your next appointment:   In Person  Provider:   Berniece Salines, DO   Other Instructions  Echocardiogram An echocardiogram is a test that uses sound waves (ultrasound) to produce images of the heart. Images from an echocardiogram can provide important information about:  Heart size and shape.  The size and thickness and movement of your heart's walls.  Heart muscle function and strength.  Heart valve function or if you have stenosis. Stenosis is when the heart valves are too narrow.  If blood is flowing backward through the heart valves (regurgitation).  A tumor or infectious growth around the heart valves.  Areas of heart muscle that are not working  well because of poor blood flow or injury from a heart attack.  Aneurysm detection. An aneurysm is a weak or damaged part of an artery wall. The wall bulges out from the  normal force of blood pumping through the body. Tell a health care provider about:  Any allergies you have.  All medicines you are taking, including vitamins, herbs, eye drops, creams, and over-the-counter medicines.  Any blood disorders you have.  Any surgeries you have had.  Any medical conditions you have.  Whether you are pregnant or may be pregnant. What are the risks? Generally, this is a safe test. However, problems may occur, including an allergic reaction to dye (contrast) that may be used during the test. What happens before the test? No specific preparation is needed. You may eat and drink normally. What happens during the test?  You will take off your clothes from the waist up and put on a hospital gown.  Electrodes or electrocardiogram (ECG)patches may be placed on your chest. The electrodes or patches are then connected to a device that monitors your heart rate and rhythm.  You will lie down on a table for an ultrasound exam. A gel will be applied to your chest to help sound waves pass through your skin.  A handheld device, called a transducer, will be pressed against your chest and moved over your heart. The transducer produces sound waves that travel to your heart and bounce back (or "echo" back) to the transducer. These sound waves will be captured in real-time and changed into images of your heart that can be viewed on a video monitor. The images will be recorded on a computer and reviewed by your health care provider.  You may be asked to change positions or hold your breath for a short time. This makes it easier to get different views or better views of your heart.  In some cases, you may receive contrast through an IV in one of your veins. This can improve the quality of the pictures from your heart. The procedure may vary among health care providers and hospitals.   What can I expect after the test? You may return to your normal, everyday life, including diet,  activities, and medicines, unless your health care provider tells you not to do that. Follow these instructions at home:  It is up to you to get the results of your test. Ask your health care provider, or the department that is doing the test, when your results will be ready.  Keep all follow-up visits. This is important. Summary  An echocardiogram is a test that uses sound waves (ultrasound) to produce images of the heart.  Images from an echocardiogram can provide important information about the size and shape of your heart, heart muscle function, heart valve function, and other possible heart problems.  You do not need to do anything to prepare before this test. You may eat and drink normally.  After the echocardiogram is completed, you may return to your normal, everyday life, unless your health care provider tells you not to do that. This information is not intended to replace advice given to you by your health care provider. Make sure you discuss any questions you have with your health care provider. Document Revised: 11/22/2019 Document Reviewed: 11/22/2019 Elsevier Patient Education  2021 [place].      Adopting a Healthy Lifestyle.  Know what a healthy weight is for you (roughly BMI <25) and aim to maintain this   Aim for  7+ servings of fruits and vegetables daily   65-80+ fluid ounces of water or unsweet tea for healthy kidneys   Limit to max 1 drink of alcohol per day; avoid smoking/tobacco   Limit animal fats in diet for cholesterol and heart health - choose grass fed whenever available   Avoid highly processed foods, and foods high in saturated/trans fats   Aim for low stress - take time to unwind and care for your mental health   Aim for 150 min of moderate intensity exercise weekly for heart health, and weights twice weekly for bone health   Aim for 7-9 hours of sleep daily   When it comes to diets, agreement about the perfect plan isnt easy to find,  even among the experts. Experts at the [employer] developed an idea known as the Healthy Eating Plate. Just imagine a plate divided into logical, healthy portions.   The emphasis is on diet quality:   Load up on vegetables and fruits - one-half of your plate: Aim for color and variety, and remember that potatoes dont count.   Go for whole grains - one-quarter of your plate: Whole wheat, barley, wheat berries, quinoa, oats, brown rice, and foods made with them. If you want pasta, go with whole wheat pasta.   Protein power - one-quarter of your plate: Fish, chicken, beans, and nuts are all healthy, versatile protein sources. Limit red meat.   The diet, however, does go beyond the plate, offering a few other suggestions.   Use healthy plant oils, such as olive, canola, soy, corn, sunflower and peanut. Check the labels, and avoid partially hydrogenated oil, which have unhealthy trans fats.   If youre thirsty, drink water. Coffee and tea are good in moderation, but skip sugary drinks and limit milk and dairy products to one or two daily servings.   The type of carbohydrate in the diet is more important than the amount. Some sources of carbohydrates, such as vegetables, fruits, whole grains, and beans-are healthier than others.   Finally, stay active  Signed, Brittany Salines, DO  08/31/2020 9:05 AM    [place]

## 2020-08-31 NOTE — Patient Instructions (Addendum)
Medication Instructions:  Your physician has recommended you make the following change in your medication:  DECREASE: Toprol-XL 25 mg once daily *If you need a refill on your cardiac medications before your next appointment, please call your pharmacy*   Lab Work: None If you have labs (blood work) drawn today and your tests are completely normal, you will receive your results only by: Marland Kitchen MyChart Message (if you have MyChart) OR . A paper copy in the mail If you have any lab test that is abnormal or we need to change your treatment, we will call you to review the results.   Testing/Procedures: Your physician has requested that you have an echocardiogram. Echocardiography is a painless test that uses sound waves to create images of your heart. It provides your doctor with information about the size and shape of your heart and how well your heart's chambers and valves are working. This procedure takes approximately one hour. There are no restrictions for this procedure.     Follow-Up: At [facility], you and your health needs are our priority.  As part of our continuing mission to provide you with exceptional heart care, we have created designated Provider Care Teams.  These Care Teams include your primary Cardiologist (physician) and Advanced Practice Providers (APPs -  Physician Assistants and Nurse Practitioners) who all work together to provide you with the care you need, when you need it.  We recommend signing up for the patient portal called "MyChart".  Sign up information is provided on this After Visit Summary.  MyChart is used to connect with patients for Virtual Visits (Telemedicine).  Patients are able to view lab/test results, encounter notes, upcoming appointments, etc.  Non-urgent messages can be sent to your provider as well.   To learn more about what you can do with MyChart, go to [email or web address].    Your next appointment:   3 month(s)  The format for your next  appointment:   In Person  Provider:   Berniece Salines, DO   Other Instructions  Echocardiogram An echocardiogram is a test that uses sound waves (ultrasound) to produce images of the heart. Images from an echocardiogram can provide important information about:  Heart size and shape.  The size and thickness and movement of your heart's walls.  Heart muscle function and strength.  Heart valve function or if you have stenosis. Stenosis is when the heart valves are too narrow.  If blood is flowing backward through the heart valves (regurgitation).  A tumor or infectious growth around the heart valves.  Areas of heart muscle that are not working well because of poor blood flow or injury from a heart attack.  Aneurysm detection. An aneurysm is a weak or damaged part of an artery wall. The wall bulges out from the normal force of blood pumping through the body. Tell a health care provider about:  Any allergies you have.  All medicines you are taking, including vitamins, herbs, eye drops, creams, and over-the-counter medicines.  Any blood disorders you have.  Any surgeries you have had.  Any medical conditions you have.  Whether you are pregnant or may be pregnant. What are the risks? Generally, this is a safe test. However, problems may occur, including an allergic reaction to dye (contrast) that may be used during the test. What happens before the test? No specific preparation is needed. You may eat and drink normally. What happens during the test?  You will take off your clothes from the waist up and  put on a hospital gown.  Electrodes or electrocardiogram (ECG)patches may be placed on your chest. The electrodes or patches are then connected to a device that monitors your heart rate and rhythm.  You will lie down on a table for an ultrasound exam. A gel will be applied to your chest to help sound waves pass through your skin.  A handheld device, called a transducer, will  be pressed against your chest and moved over your heart. The transducer produces sound waves that travel to your heart and bounce back (or "echo" back) to the transducer. These sound waves will be captured in real-time and changed into images of your heart that can be viewed on a video monitor. The images will be recorded on a computer and reviewed by your health care provider.  You may be asked to change positions or hold your breath for a short time. This makes it easier to get different views or better views of your heart.  In some cases, you may receive contrast through an IV in one of your veins. This can improve the quality of the pictures from your heart. The procedure may vary among health care providers and hospitals.   What can I expect after the test? You may return to your normal, everyday life, including diet, activities, and medicines, unless your health care provider tells you not to do that. Follow these instructions at home:  It is up to you to get the results of your test. Ask your health care provider, or the department that is doing the test, when your results will be ready.  Keep all follow-up visits. This is important. Summary  An echocardiogram is a test that uses sound waves (ultrasound) to produce images of the heart.  Images from an echocardiogram can provide important information about the size and shape of your heart, heart muscle function, heart valve function, and other possible heart problems.  You do not need to do anything to prepare before this test. You may eat and drink normally.  After the echocardiogram is completed, you may return to your normal, everyday life, unless your health care provider tells you not to do that. This information is not intended to replace advice given to you by your health care provider. Make sure you discuss any questions you have with your health care provider. Document Revised: 11/22/2019 Document Reviewed: 11/22/2019 Elsevier  Patient Education  2021 [employer].

## 2020-09-04 ENCOUNTER — Encounter: Payer: Self-pay | Admitting: Family

## 2020-09-07 ENCOUNTER — Ambulatory Visit (INDEPENDENT_AMBULATORY_CARE_PROVIDER_SITE_OTHER): Payer: 59 | Admitting: Family

## 2020-09-07 ENCOUNTER — Inpatient Hospital Stay (HOSPITAL_COMMUNITY)
Admission: EM | Admit: 2020-09-07 | Discharge: 2020-09-10 | DRG: 446 | Disposition: A | Discharge status: Home Health | Payer: 59 | Attending: Family Medicine | Admitting: Family Medicine

## 2020-09-07 ENCOUNTER — Encounter: Payer: Self-pay | Admitting: Family

## 2020-09-07 ENCOUNTER — Encounter (HOSPITAL_COMMUNITY): Payer: Self-pay

## 2020-09-07 ENCOUNTER — Inpatient Hospital Stay (HOSPITAL_COMMUNITY): Payer: 59

## 2020-09-07 ENCOUNTER — Emergency Department (HOSPITAL_COMMUNITY): Payer: 59

## 2020-09-07 ENCOUNTER — Other Ambulatory Visit: Payer: Self-pay

## 2020-09-07 ENCOUNTER — Encounter: Payer: Self-pay | Admitting: Allergy

## 2020-09-07 VITALS — BP 124/80 | HR 60 | Temp 97.5°F | Ht 69.0 in | Wt 260.2 lb

## 2020-09-07 DIAGNOSIS — Z20822 Contact with and (suspected) exposure to covid-19: Secondary | ICD-10-CM | POA: Diagnosis present

## 2020-09-07 DIAGNOSIS — K219 Gastro-esophageal reflux disease without esophagitis: Secondary | ICD-10-CM | POA: Diagnosis present

## 2020-09-07 DIAGNOSIS — I10 Essential (primary) hypertension: Secondary | ICD-10-CM | POA: Diagnosis present

## 2020-09-07 DIAGNOSIS — Z9884 Bariatric surgery status: Secondary | ICD-10-CM

## 2020-09-07 DIAGNOSIS — Z823 Family history of stroke: Secondary | ICD-10-CM

## 2020-09-07 DIAGNOSIS — F32A Depression, unspecified: Secondary | ICD-10-CM | POA: Diagnosis present

## 2020-09-07 DIAGNOSIS — Z881 Allergy status to other antibiotic agents status: Secondary | ICD-10-CM

## 2020-09-07 DIAGNOSIS — Z8249 Family history of ischemic heart disease and other diseases of the circulatory system: Secondary | ICD-10-CM

## 2020-09-07 DIAGNOSIS — K8050 Calculus of bile duct without cholangitis or cholecystitis without obstruction: Secondary | ICD-10-CM

## 2020-09-07 DIAGNOSIS — E785 Hyperlipidemia, unspecified: Secondary | ICD-10-CM | POA: Diagnosis present

## 2020-09-07 DIAGNOSIS — Z833 Family history of diabetes mellitus: Secondary | ICD-10-CM | POA: Diagnosis not present

## 2020-09-07 DIAGNOSIS — K760 Fatty (change of) liver, not elsewhere classified: Secondary | ICD-10-CM | POA: Diagnosis present

## 2020-09-07 DIAGNOSIS — Z79899 Other long term (current) drug therapy: Secondary | ICD-10-CM

## 2020-09-07 DIAGNOSIS — R17 Unspecified jaundice: Secondary | ICD-10-CM

## 2020-09-07 DIAGNOSIS — K8070 Calculus of gallbladder and bile duct without cholecystitis without obstruction: Secondary | ICD-10-CM | POA: Diagnosis present

## 2020-09-07 DIAGNOSIS — Z888 Allergy status to other drugs, medicaments and biological substances status: Secondary | ICD-10-CM

## 2020-09-07 DIAGNOSIS — R1013 Epigastric pain: Secondary | ICD-10-CM | POA: Diagnosis present

## 2020-09-07 DIAGNOSIS — Z800 Family history of malignant neoplasm of digestive organs: Secondary | ICD-10-CM | POA: Diagnosis not present

## 2020-09-07 DIAGNOSIS — G43909 Migraine, unspecified, not intractable, without status migrainosus: Secondary | ICD-10-CM | POA: Diagnosis present

## 2020-09-07 DIAGNOSIS — E7800 Pure hypercholesterolemia, unspecified: Secondary | ICD-10-CM | POA: Diagnosis present

## 2020-09-07 DIAGNOSIS — Z803 Family history of malignant neoplasm of breast: Secondary | ICD-10-CM

## 2020-09-07 DIAGNOSIS — K449 Diaphragmatic hernia without obstruction or gangrene: Secondary | ICD-10-CM | POA: Diagnosis present

## 2020-09-07 DIAGNOSIS — G4730 Sleep apnea, unspecified: Secondary | ICD-10-CM | POA: Diagnosis present

## 2020-09-07 DIAGNOSIS — Z87891 Personal history of nicotine dependence: Secondary | ICD-10-CM

## 2020-09-07 DIAGNOSIS — Z91041 Radiographic dye allergy status: Secondary | ICD-10-CM

## 2020-09-07 DIAGNOSIS — K831 Obstruction of bile duct: Secondary | ICD-10-CM | POA: Insufficient documentation

## 2020-09-07 DIAGNOSIS — Z8052 Family history of malignant neoplasm of bladder: Secondary | ICD-10-CM

## 2020-09-07 DIAGNOSIS — Z818 Family history of other mental and behavioral disorders: Secondary | ICD-10-CM | POA: Diagnosis not present

## 2020-09-07 DIAGNOSIS — R7989 Other specified abnormal findings of blood chemistry: Secondary | ICD-10-CM

## 2020-09-07 DIAGNOSIS — R109 Unspecified abdominal pain: Secondary | ICD-10-CM | POA: Diagnosis not present

## 2020-09-07 DIAGNOSIS — R5383 Other fatigue: Secondary | ICD-10-CM

## 2020-09-07 DIAGNOSIS — F4310 Post-traumatic stress disorder, unspecified: Secondary | ICD-10-CM | POA: Diagnosis present

## 2020-09-07 DIAGNOSIS — Z808 Family history of malignant neoplasm of other organs or systems: Secondary | ICD-10-CM | POA: Diagnosis not present

## 2020-09-07 LAB — LIPASE, BLOOD: Lipase: 17 U/L (ref 11–51)

## 2020-09-07 LAB — PROTIME-INR
INR: 0.9 (ref 0.8–1.2)
Prothrombin Time: 11.7 seconds (ref 11.4–15.2)

## 2020-09-07 LAB — URINALYSIS, ROUTINE W REFLEX MICROSCOPIC
Bacteria, UA: NONE SEEN
Glucose, UA: NEGATIVE mg/dL
Hgb urine dipstick: NEGATIVE
Ketones, ur: 20 mg/dL — AB
Leukocytes,Ua: NEGATIVE
Nitrite: NEGATIVE
Protein, ur: 30 mg/dL — AB
Specific Gravity, Urine: 1.02 (ref 1.005–1.030)
pH: 5 (ref 5.0–8.0)

## 2020-09-07 LAB — COMPREHENSIVE METABOLIC PANEL
ALT: 464 U/L — ABNORMAL HIGH (ref 0–44)
AST: 269 U/L — ABNORMAL HIGH (ref 15–41)
Albumin: 3.2 g/dL — ABNORMAL LOW (ref 3.5–5.0)
Alkaline Phosphatase: 708 U/L — ABNORMAL HIGH (ref 38–126)
Anion gap: 7 (ref 5–15)
BUN: 6 mg/dL (ref 6–20)
CO2: 25 mmol/L (ref 22–32)
Calcium: 9 mg/dL (ref 8.9–10.3)
Chloride: 107 mmol/L (ref 98–111)
Creatinine, Ser: 0.6 mg/dL (ref 0.44–1.00)
GFR, Estimated: >60 mL/min (ref >60)
Glucose, Bld: 122 mg/dL — ABNORMAL HIGH (ref 70–99)
Potassium: 3.1 mmol/L — ABNORMAL LOW (ref 3.5–5.1)
Sodium: 139 mmol/L (ref 135–145)
Total Bilirubin: 3.5 mg/dL — ABNORMAL HIGH (ref 0.3–1.2)
Total Protein: 6.6 g/dL (ref 6.5–8.1)

## 2020-09-07 LAB — POCT URINALYSIS DIP (MANUAL ENTRY)
Blood, UA: NEGATIVE
Glucose, UA: NEGATIVE mg/dL
Nitrite, UA: NEGATIVE
Protein Ur, POC: 100 mg/dL — AB
Spec Grav, UA: 1.025 (ref 1.010–1.025)
Urobilinogen, UA: 4 E.U./dL — AB
pH, UA: 6 (ref 5.0–8.0)

## 2020-09-07 LAB — CBC
HCT: 37.2 % (ref 36.0–46.0)
Hemoglobin: 12.9 g/dL (ref 12.0–15.0)
MCH: 33.8 pg (ref 26.0–34.0)
MCHC: 34.7 g/dL (ref 30.0–36.0)
MCV: 97.4 fL (ref 80.0–100.0)
Platelets: 270 10*3/uL (ref 150–400)
RBC: 3.82 MIL/uL — ABNORMAL LOW (ref 3.87–5.11)
RDW: 13.9 % (ref 11.5–15.5)
WBC: 5.6 10*3/uL (ref 4.0–10.5)
nRBC: 0 % (ref 0.0–0.2)

## 2020-09-07 LAB — MAGNESIUM: Magnesium: 1.8 mg/dL (ref 1.7–2.4)

## 2020-09-07 LAB — SARS CORONAVIRUS 2 (TAT 6-24 HRS): SARS Coronavirus 2: NEGATIVE

## 2020-09-07 MED ORDER — ACETAMINOPHEN 325 MG PO TABS: 650.0000 mg | ORAL_TABLET | Freq: Four times a day (QID) | ORAL | Status: DC | PRN

## 2020-09-07 MED ORDER — GADOBUTROL 1 MMOL/ML IV SOLN
10.0000 mL | Freq: Once | INTRAVENOUS | Status: AC | PRN
  Administered 2020-09-07: 10 mL via INTRAVENOUS

## 2020-09-07 MED ORDER — ONDANSETRON HCL 4 MG PO TABS: 4.0000 mg | ORAL_TABLET | Freq: Four times a day (QID) | ORAL | Status: DC | PRN

## 2020-09-07 MED ORDER — ONDANSETRON HCL 4 MG/2ML IJ SOLN: 4.0000 mg | Freq: Four times a day (QID) | INTRAMUSCULAR | Status: DC | PRN

## 2020-09-07 MED ORDER — VITAMIN E 180 MG (400 UNIT) PO CAPS
800.0000 [IU] | ORAL_CAPSULE | Freq: Every day | ORAL | Status: DC
  Administered 2020-09-09 – 2020-09-10 (×2): 800 [IU] via ORAL
  Filled 2020-09-07: qty 2
  Filled 2020-09-07: qty 8
  Filled 2020-09-07: qty 2

## 2020-09-07 MED ORDER — INDOMETHACIN 50 MG RE SUPP
100.0000 mg | Freq: Once | RECTAL | Status: AC
  Administered 2020-09-07: 100 mg via RECTAL
  Filled 2020-09-07: qty 2

## 2020-09-07 MED ORDER — QUETIAPINE FUMARATE 50 MG PO TABS
150.0000 mg | ORAL_TABLET | Freq: Every day | ORAL | Status: DC
  Administered 2020-09-07 – 2020-09-09 (×3): 150 mg via ORAL
  Filled 2020-09-07 (×3): qty 3

## 2020-09-07 MED ORDER — DULOXETINE HCL 60 MG PO CPEP
60.0000 mg | ORAL_CAPSULE | Freq: Two times a day (BID) | ORAL | Status: DC
  Administered 2020-09-07 – 2020-09-10 (×5): 60 mg via ORAL
  Filled 2020-09-07 (×5): qty 1

## 2020-09-07 MED ORDER — METOPROLOL SUCCINATE ER 25 MG PO TB24
25.0000 mg | ORAL_TABLET | Freq: Every day | ORAL | Status: DC
  Administered 2020-09-07 – 2020-09-10 (×3): 25 mg via ORAL
  Filled 2020-09-07 (×3): qty 1

## 2020-09-07 MED ORDER — ADULT MULTIVITAMIN W/MINERALS CH
1.0000 | ORAL_TABLET | Freq: Every day | ORAL | Status: DC
  Administered 2020-09-07 – 2020-09-10 (×3): 1 via ORAL
  Filled 2020-09-07 (×4): qty 1

## 2020-09-07 MED ORDER — VITAMIN E 45 MG (100 UNIT) PO CAPS
200.0000 [IU] | ORAL_CAPSULE | Freq: Every day | ORAL | Status: DC
  Administered 2020-09-09 – 2020-09-10 (×2): 200 [IU] via ORAL
  Filled 2020-09-07 (×3): qty 2

## 2020-09-07 MED ORDER — SODIUM CHLORIDE 0.9 % IV SOLN: INTRAVENOUS | Status: DC

## 2020-09-07 MED ORDER — VITAMIN E 45 MG (100 UNIT) PO CAPS: 1000.0000 [IU] | ORAL_CAPSULE | Freq: Every day | ORAL | Status: DC

## 2020-09-07 MED ORDER — ACETAMINOPHEN 650 MG RE SUPP: 650.0000 mg | Freq: Four times a day (QID) | RECTAL | Status: DC | PRN

## 2020-09-07 MED ORDER — LORATADINE 10 MG PO TABS
10.0000 mg | ORAL_TABLET | Freq: Every day | ORAL | Status: DC
  Administered 2020-09-07 – 2020-09-10 (×3): 10 mg via ORAL
  Filled 2020-09-07 (×3): qty 1

## 2020-09-07 MED ORDER — VITAMIN E 45 MG (100 UNIT) PO CAPS
1000.0000 [IU] | ORAL_CAPSULE | Freq: Every day | ORAL | Status: DC
  Filled 2020-09-07: qty 10

## 2020-09-07 MED ORDER — LACTATED RINGERS IV SOLN: INTRAVENOUS | Status: DC

## 2020-09-07 MED ORDER — CIPROFLOXACIN IN D5W 400 MG/200ML IV SOLN
400.0000 mg | Freq: Once | INTRAVENOUS | Status: DC
  Administered 2020-09-07: 400 mg via INTRAVENOUS
  Filled 2020-09-07: qty 200

## 2020-09-07 MED ORDER — VITAMIN D3 25 MCG (1000 UNIT) PO TABS
1000.0000 [IU] | ORAL_TABLET | Freq: Every day | ORAL | Status: DC
  Administered 2020-09-07 – 2020-09-10 (×3): 1000 [IU] via ORAL
  Filled 2020-09-07 (×4): qty 1

## 2020-09-07 MED ORDER — POTASSIUM CHLORIDE 10 MEQ/100ML IV SOLN
10.0000 meq | INTRAVENOUS | Status: AC
  Administered 2020-09-07 – 2020-09-08 (×6): 10 meq via INTRAVENOUS
  Filled 2020-09-07 (×7): qty 100

## 2020-09-07 MED ORDER — MONTELUKAST SODIUM 10 MG PO TABS
10.0000 mg | ORAL_TABLET | Freq: Every day | ORAL | Status: DC
  Administered 2020-09-07 – 2020-09-09 (×3): 10 mg via ORAL
  Filled 2020-09-07 (×4): qty 1

## 2020-09-07 MED ORDER — MULTI-VITAMIN PO TABS: 1.0000 | ORAL_TABLET | Freq: Every day | ORAL | Status: DC

## 2020-09-07 NOTE — ED Provider Notes (Signed)
[facility] DEPT Provider Note   CSN: [id number] Arrival date & time: 09/07/20  1024     History Chief Complaint  Patient presents with  . Abdominal Pain    Brittany [name] is a 55 y.o. female.  Patient presents to ER chief complaint of epigastric abdominal pain and concern for jaundice.  She says she has had intermittent epigastric pain off and on for the past several weeks.  She had ultrasound done about 2 weeks ago that showed positive gallstones.  She was being managed by primary care doctor awaiting surgical evaluation on outpatient basis.  She had increased pain today and went to see her doctor who drew some blood work and had elevated liver enzymes and sent to the ER for further evaluation.  Patient states the pain has since improved and she has very minimal abdominal pain at this time.  Denies any fevers or cough or vomiting or diarrhea.        Past Medical History:  Diagnosis Date  . Allergy   . Angio-edema   . Anxiety   . Arthritis    hips, knees and hands   . Blood transfusion without reported diagnosis   . Depression   . GERD (gastroesophageal reflux disease)    hx of   . H/O hiatal hernia   . History of chicken pox   . Hypercholesterolemia   . Hypertension   . Migraine   . Migraines   . Morbid obesity ([place])   . Positive TB test   . PTSD (post-traumatic stress disorder)   . Sleep apnea    no CPAP machine   . Ulcer, stomach peptic   . Urticaria     Patient Active Problem List   Diagnosis Date Noted  . Urticaria   . Ulcer, stomach peptic   . Sleep apnea   . Positive TB test   . Migraines   . Hypercholesterolemia   . History of chicken pox   . H/O hiatal hernia   . Blood transfusion without reported diagnosis   . Arthritis   . Anxiety   . Angio-edema   . Allergy   . Allergic contact dermatitis 07/09/2020  . Chronic idiopathic urticaria 01/17/2019  . Seasonal and perennial allergic rhinoconjunctivitis 11/08/2018  .  Allergic reaction 11/08/2018  . Diarrhea 11/08/2018  . Right thigh pain 05/19/2018  . Melena   . Acute gastrojejunal anastomotic ulcer   . Macrocytic anemia   . Acute GI bleeding 09/28/2017  . Epigastric abdominal pain 11/19/2016  . Incisional hernia with obstruction but no gangrene 07/21/2016  . Menopause 04/22/2016  . Routine general medical examination at a health care facility 03/18/2016  . Obesity 03/18/2016  . Severe episode of recurrent major depressive disorder, without psychotic features ([place])   . Depression, major, severe recurrence ([place]) 09/11/2015  . Major depressive disorder, recurrent, severe without psychotic features ([place])   . MDD (major depressive disorder) 10/30/2014  . MDD (major depressive disorder), recurrent severe, without psychosis ([place])   . Suicidal ideation 10/05/2014  . Depression 06/12/2014  . Rash 01/04/2014  . Normocytic anemia 01/04/2014  . DJD (degenerative joint disease) 01/04/2014  . Hypertension 01/04/2014  . Dyslipidemia 01/04/2014  . GERD (gastroesophageal reflux disease) 01/04/2014  . Obstructive sleep apnea 01/04/2014  . Status post small bowel resection 01/04/2014  . Abdominal abscess 01/01/2014  . Wound infection after surgery 01/01/2014  . PTSD (post-traumatic stress disorder) 11/23/2013  . Severe recurrent major depression without psychotic features ([place]) 11/22/2013  .  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 Morbid obesity, Weight - 312, BMI - 45.2 04/21/2012    Past Surgical History:  Procedure Laterality Date  . ABDOMINAL HYSTERECTOMY  1997 & 2006  . BREATH TEK H PYLORI  05/03/2012   Procedure: BREATH TEK H PYLORI;  Surgeon: [name], MD;  Location: [name];  Service: General;  Laterality: N/A;  . COLONOSCOPY     hx of benign polyps   . ESOPHAGOGASTRODUODENOSCOPY N/A 09/29/2017   Procedure: ESOPHAGOGASTRODUODENOSCOPY (EGD);  Surgeon: Ladene Artist, MD;  Location: [name];  Service:  Endoscopy;  Laterality: N/A;  . FOOT SURGERY    . GASTRIC BYPASS    . GASTRIC ROUX-EN-Y N/A 07/05/2012   Procedure: LAPAROSCOPIC ROUX-EN-Y GASTRIC;  Surgeon: [name], MD;  Location: [facility];  Service: General;  Laterality: N/A;  . IRRIGATION AND DEBRIDEMENT ABSCESS N/A 01/01/2014   Procedure: IRRIGATION AND DEBRIDEMENT ABSCESS;  Surgeon: [name], MD;  Location: [facility];  Service: General;  Laterality: N/A;  . LAPAROSCOPIC LYSIS OF ADHESIONS N/A 07/05/2012   Procedure: LAPAROSCOPIC LYSIS OF ADHESIONS;  Surgeon: [name], MD;  Location: [facility];  Service: General;  Laterality: N/A;  repair of abdominal wall hernia  . right tube and ovary removed     . UPPER GI ENDOSCOPY N/A 07/05/2012   Procedure: UPPER GI ENDOSCOPY;  Surgeon: [name], MD;  Location: [facility];  Service: General;  Laterality: N/A;     OB History    Gravida  0   Para  0   Term  0   Preterm  0   AB  0   Living  0     SAB  0   IAB  0   Ectopic  0   Multiple  0   Live Births  0           Family History  Problem Relation Age of Onset  . Colon cancer Mother 88  . Depression Father   . Diabetes Father   . Heart disease Father   . Bladder Cancer Father   . Depression Sister   . Post-traumatic stress disorder Sister   . Alcohol abuse Paternal Uncle   . Bipolar disorder Cousin   . Anxiety disorder Sister   . Eczema Sister   . Cancer Maternal Aunt        breast  . Breast cancer Maternal Aunt   . Melanoma Maternal Grandmother   . Colon cancer Maternal Grandmother        does not know age of onset  . Healthy Maternal Grandfather   . Healthy Paternal Grandmother   . Heart disease Paternal Grandfather   . Stroke Paternal Grandfather   . Diabetes Paternal Grandfather     Social History   Tobacco Use  . Smoking status: Former Smoker    Packs/day: 0.10    Years: 1.00    Pack years: 0.10    Types: Cigarettes    Quit date: 04/27/2004    Years since quitting: 16.3  .  Smokeless tobacco: Never Used  Vaping Use  . Vaping Use: Never used  Substance Use Topics  . Alcohol use: Yes    Alcohol/week: 0.0 standard drinks    Comment: Occasional use/ 2 drinks a month  . Drug use: No    Home Medications Prior to Admission medications   Medication Sig Start Date End Date Taking? Authorizing Provider  calcium-vitamin D (OSCAL WITH D) 500-200 MG-UNIT tablet  Take 3 tablets by mouth every morning. For bone health Patient taking differently: Take 3 tablets by mouth every evening. For bone health 09/17/15   Lindell Spar I, NP  cetirizine (ZYRTEC) 10 MG tablet Take 20 mg by mouth 2 (two) times daily.    [provider]  cholecalciferol (VITAMIN D) 1000 units tablet Take 1 tablet (1,000 Units total) by mouth daily. For bone health 09/17/15   Lindell Spar I, NP  DULoxetine (CYMBALTA) 60 MG capsule Take 1 capsule (60 mg total) by mouth 2 (two) times daily. 08/29/20   Arfeen, Arlyce Harman, MD  EPINEPHrine (EPIPEN 2-PAK) 0.3 mg/0.3 mL IJ SOAJ injection Inject 0.3 mg into the muscle as needed for anaphylaxis. 07/09/20   Garnet Sierras, DO  famotidine (PEPCID) 20 MG tablet Take 1 tablet (20 mg total) by mouth 2 (two) times daily as needed for heartburn or indigestion. 11/08/18   Bobbitt, [place] Muta, MD  metoprolol succinate (TOPROL XL) 25 MG 24 hr tablet Take 1 tablet (25 mg total) by mouth daily. 08/31/20   Tobb, Kardie, DO  montelukast (SINGULAIR) 10 MG tablet TAKE 1 TABLET(10 MG) BY MOUTH AT BEDTIME 03/26/20   Garnet Sierras, DO  Multiple Vitamin (MULTI-VITAMIN) tablet Take by mouth.    [provider]  QUEtiapine (SEROQUEL) 50 MG tablet TAKE 3 TABLETS(150 MG) BY MOUTH AT BEDTIME 08/29/20   Arfeen, Arlyce Harman, MD  vitamin B-12 (CYANOCOBALAMIN) 500 MCG tablet Take 1 tablet (500 mcg total) by mouth daily. For Vitamin B-12 replacement 09/17/15   Lindell Spar I, NP  vitamin E 1000 UNIT capsule Take 1,000 Units by mouth daily.    [provider]  XOLAIR 150 MG injection INJECT 300  MG UNDER THE SKIN EVERY 14 DAYS 06/21/20   Garnet Sierras, DO    Allergies    Contrast media [iodinated diagnostic agents], Amoxicillin, Bactrim [sulfamethoxazole-trimethoprim], and Lamictal [lamotrigine]  Review of Systems   Review of Systems  Constitutional: Negative for fever.  HENT: Negative for ear pain.   Eyes: Negative for pain.  Respiratory: Negative for cough.   Cardiovascular: Negative for chest pain.  Gastrointestinal: Positive for abdominal pain.  Genitourinary: Negative for flank pain.  Musculoskeletal: Negative for back pain.  Skin: Negative for rash.  Neurological: Negative for headaches.    Physical Exam Updated Vital Signs BP (!) 182/106   Pulse (!) 55   Temp 98.2 F (36.8 C) (Oral)   Resp 19   Ht '5\' 9"'  (1.753 m)   Wt 118 kg   SpO2 99%   BMI 38.42 kg/m   Physical Exam Constitutional:      General: She is not in acute distress.    Appearance: Normal appearance.  HENT:     Head: Normocephalic.     Nose: Nose normal.  Eyes:     Extraocular Movements: Extraocular movements intact.  Cardiovascular:     Rate and Rhythm: Normal rate.  Pulmonary:     Effort: Pulmonary effort is normal.  Abdominal:     Tenderness: There is abdominal tenderness in the epigastric area.     Comments: Mild epigastric tenderness.  No guarding or rebound noted.  Musculoskeletal:        General: Normal range of motion.     Cervical back: Normal range of motion.  Neurological:     General: No focal deficit present.     Mental Status: She is alert. Mental status is at baseline.     ED Results / Procedures / Treatments  Labs (all labs ordered are listed, but only abnormal results are displayed) Labs Reviewed  COMPREHENSIVE METABOLIC PANEL - Abnormal; Notable for the following components:      Result Value   Potassium 3.1 (*)    Glucose, Bld 122 (*)    Albumin 3.2 (*)    AST 269 (*)    ALT 464 (*)    Alkaline Phosphatase 708 (*)    Total Bilirubin 3.5 (*)    All other  components within normal limits  CBC - Abnormal; Notable for the following components:   RBC 3.82 (*)    All other components within normal limits  LIPASE, BLOOD  URINALYSIS, ROUTINE W REFLEX MICROSCOPIC    EKG None  Radiology US Abdomen Limited  Result Date: 09/07/2020 CLINICAL DATA:  Acute right upper quadrant abdominal pain. EXAM: ULTRASOUND ABDOMEN LIMITED RIGHT UPPER QUADRANT COMPARISON:  Aug 30, 2020.  Sep 05, 2016. FINDINGS: Gallbladder: Cholelithiasis is noted without significant gallbladder wall thickening or pericholecystic fluid. No sonographic Murphy's sign is noted. Common bile duct: Diameter: 8 mm which is mildly dilated. Liver: 1.8 cm cyst is seen in left hepatic lobe. Mildly increased echogenicity of hepatic parenchyma is noted suggesting hepatic steatosis. Portal vein is patent on color Doppler imaging with normal direction of blood flow towards the liver. Other: None. IMPRESSION: Cholelithiasis is noted without evidence of cholecystitis. Common bile duct is mildly dilated at 8 mm. Correlation with liver function tests is recommended to rule out distal common bile duct obstruction. MRCP may be performed for further evaluation. Possible hepatic steatosis.  Left hepatic cyst. Electronically Signed   By: Marijo Conception M.D.   On: 09/07/2020 12:27    Procedures Procedures   Medications Ordered in ED Medications - No data to display  ED Course  I have reviewed the triage vital signs and the nursing notes.  Pertinent labs & imaging results that were available during my care of the patient were reviewed by me and considered in my medical decision making (see chart for details).    MDM Rules/Calculators/A&P                          Labs are sent here patient had a white count of 5 hemoglobin 13 chemistry shows elevated liver enzymes alk phos 700 AST ALT in the 240 range.  T bili elevated at 3.5.  Ultrasound shows positive Coley lithiasis but no evidence of cholecystitis.   Common bile duct appears dilated to about 8 mm.  Patient otherwise appears relatively comfortable.  Case discussed with on-call GI who will come and see the patient.  Final Clinical Impression(s) / ED Diagnoses Final diagnoses:  Choledocholithiasis    Rx / DC Orders ED Discharge Orders    None       Luna Fuse, MD 09/07/20 [phone]

## 2020-09-07 NOTE — Consult Note (Addendum)
Consultation  Referring Provider:  Dr. Almyra Free Primary Care Physician:  [name], [place] Primary Gastroenterologist: Dr. Fuller Plan       Reason for Consultation:   Elevated LFTs, Jaundice, dilated CBD         HPI:   Brittany [name] is a 55 y.o. female with a past medical history as listed below including status post Roux-en-Y in 2015, who presented to the ER today for concern of jaundice.    Today, the patient explains that for the past month or so she has had a 1-2/10 right upper quadrant/epigastric pain which feels "like I have been punched".  Has also noticed that her urine has turned a dark orange color and her bowel movements have become very light.  Explains that she had an ultrasound done 1 to 2 weeks ago which showed some gallstones so she was awaiting referral/office visit information for surgery.  Associated symptoms include extreme tiredness over the past week or so.  Also tells me that the pain seemed to worsen and she developed cramps whenever she was passing a stool.  Also had a lot of nausea after eating anything.  Has lost 14 pounds over this time.  No heartburn or reflux so she tells me she is on Pepcid for chronic hives which she thinks may be "masking my symptoms".    Denies fever, chills or symptoms that awaken her from sleep.  ER course: Potassium 3.1, AST 269, ALT 464, alk phos 708, total bili 3.5; right upper quadrant ultrasound showed cholelithiasis without evidence of cholecystitis and a CBD mildly dilated at 8 mm also possible hepatic steatosis  Most recent GI history: 09/29/2017 EGD Dr. Fuller Plan: - LA Grade B reflux esophagitis. 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 - Roux-en-Y gastrojejunostomy with gastrojejunal anastomosis characterized by two clean based ulcerations. - Small hiatal hernia. - Normal examined jejunum. - No specimens collected.  06/05/2016 Colonoscopy Dr. Fuller Plan: - The perianal and digital rectal examinations were normal. - Internal hemorrhoids  were found during retroflexion. The hemorrhoids were small and Grade I (internal hemorrhoids that do not prolapse). - Many small-mouthed diverticula were found in the left colon. There was no evidence of diverticular bleeding. - The exam was otherwise without abnormality on direct and retroflexion views  Past Medical History:  Diagnosis Date  . Allergy   . Angio-edema   . Anxiety   . Arthritis    hips, knees and hands   . Blood transfusion without reported diagnosis   . Depression   . GERD (gastroesophageal reflux disease)    hx of   . H/O hiatal hernia   . History of chicken pox   . Hypercholesterolemia   . Hypertension   . Migraine   . Migraines   . Morbid obesity ([place])   . Positive TB test   . PTSD (post-traumatic stress disorder)   . Sleep apnea    no CPAP machine   . Ulcer, stomach peptic   . Urticaria     Past Surgical History:  Procedure Laterality Date  . ABDOMINAL HYSTERECTOMY  1997 & 2006  . BREATH TEK H PYLORI  05/03/2012   Procedure: BREATH TEK H PYLORI;  Surgeon: [name], MD;  Location: [name];  Service: General;  Laterality: N/A;  . COLONOSCOPY     hx of benign polyps   . ESOPHAGOGASTRODUODENOSCOPY N/A 09/29/2017   Procedure: ESOPHAGOGASTRODUODENOSCOPY (EGD);  Surgeon: Ladene Artist, MD;  Location: [name];  Service: Endoscopy;  Laterality:  N/A;  . FOOT SURGERY    . GASTRIC BYPASS    . GASTRIC ROUX-EN-Y N/A 07/05/2012   Procedure: LAPAROSCOPIC ROUX-EN-Y GASTRIC;  Surgeon: [name], MD;  Location: [facility];  Service: General;  Laterality: N/A;  . IRRIGATION AND DEBRIDEMENT ABSCESS N/A 01/01/2014   Procedure: IRRIGATION AND DEBRIDEMENT ABSCESS;  Surgeon: [name], MD;  Location: [facility];  Service: General;  Laterality: N/A;  . LAPAROSCOPIC LYSIS OF ADHESIONS N/A 07/05/2012   Procedure: LAPAROSCOPIC LYSIS OF ADHESIONS;  Surgeon: [name], MD;  Location: [facility];  Service: General;  Laterality: N/A;  repair of abdominal wall  hernia  . right tube and ovary removed     . UPPER GI ENDOSCOPY N/A 07/05/2012   Procedure: UPPER GI ENDOSCOPY;  Surgeon: [name], MD;  Location: [facility];  Service: General;  Laterality: N/A;    Family History  Problem Relation Age of Onset  . Colon cancer Mother 82  . Depression Father   . Diabetes Father   . Heart disease Father   . Bladder Cancer Father   . Depression Sister   . Post-traumatic stress disorder Sister   . Alcohol abuse Paternal Uncle   . Bipolar disorder Cousin   . Anxiety disorder Sister   . Eczema Sister   . Cancer Maternal Aunt        breast  . Breast cancer Maternal Aunt   . Melanoma Maternal Grandmother   . Colon cancer Maternal Grandmother        does not know age of onset  . Healthy Maternal Grandfather   . Healthy Paternal Grandmother   . Heart disease Paternal Grandfather   . Stroke Paternal Grandfather   . Diabetes Paternal Grandfather      Social History   Tobacco Use  . Smoking status: Former Smoker    Packs/day: 0.10    Years: 1.00    Pack years: 0.10    Types: Cigarettes    Quit date: 04/27/2004    Years since quitting: 16.3  . Smokeless tobacco: Never Used  Vaping Use  . Vaping Use: Never used  Substance Use Topics  . Alcohol use: Yes    Alcohol/week: 0.0 standard drinks    Comment: Occasional use/ 2 drinks a month  . Drug use: No    Prior to Admission medications   Medication Sig Start Date End Date Taking? Authorizing Provider  calcium-vitamin D (OSCAL WITH D) 500-200 MG-UNIT tablet Take 3 tablets by mouth every morning. For bone health Patient taking differently: Take 3 tablets by mouth every evening. For bone health 09/17/15   Lindell Spar I, NP  cetirizine (ZYRTEC) 10 MG tablet Take 20 mg by mouth 2 (two) times daily.    [provider]  cholecalciferol (VITAMIN D) 1000 units tablet Take 1 tablet (1,000 Units total) by mouth daily. For bone health 09/17/15   Lindell Spar I, NP  DULoxetine (CYMBALTA) 60 MG capsule  Take 1 capsule (60 mg total) by mouth 2 (two) times daily. 08/29/20   Arfeen, Arlyce Harman, MD  EPINEPHrine (EPIPEN 2-PAK) 0.3 mg/0.3 mL IJ SOAJ injection Inject 0.3 mg into the muscle as needed for anaphylaxis. 07/09/20   Garnet Sierras, DO  famotidine (PEPCID) 20 MG tablet Take 1 tablet (20 mg total) by mouth 2 (two) times daily as needed for heartburn or indigestion. 11/08/18   Bobbitt, [place] Muta, MD  metoprolol succinate (TOPROL XL) 25 MG 24 hr tablet Take 1 tablet (25 mg total) by  mouth daily. 08/31/20   Tobb, Kardie, DO  montelukast (SINGULAIR) 10 MG tablet TAKE 1 TABLET(10 MG) BY MOUTH AT BEDTIME 03/26/20   Garnet Sierras, DO  Multiple Vitamin (MULTI-VITAMIN) tablet Take by mouth.    [provider]  QUEtiapine (SEROQUEL) 50 MG tablet TAKE 3 TABLETS(150 MG) BY MOUTH AT BEDTIME 08/29/20   Arfeen, Arlyce Harman, MD  vitamin B-12 (CYANOCOBALAMIN) 500 MCG tablet Take 1 tablet (500 mcg total) by mouth daily. For Vitamin B-12 replacement 09/17/15   Lindell Spar I, NP  vitamin E 1000 UNIT capsule Take 1,000 Units by mouth daily.    [provider]  XOLAIR 150 MG injection INJECT 300 MG UNDER THE SKIN EVERY 14 DAYS 06/21/20   Garnet Sierras, DO    Current Facility-Administered Medications  Medication Dose Route Frequency Provider Last Rate Last Admin  . omalizumab Arvid Right) injection 300 mg  300 mg Subcutaneous Q28 days Garnet Sierras, DO   300 mg at 08/13/20 1555   Current Outpatient Medications  Medication Sig Dispense Refill  . calcium-vitamin D (OSCAL WITH D) 500-200 MG-UNIT tablet Take 3 tablets by mouth every morning. For bone health (Patient taking differently: Take 3 tablets by mouth every evening. For bone health)    . cetirizine (ZYRTEC) 10 MG tablet Take 20 mg by mouth 2 (two) times daily.    . cholecalciferol (VITAMIN D) 1000 units tablet Take 1 tablet (1,000 Units total) by mouth daily. For bone health    . DULoxetine (CYMBALTA) 60 MG capsule Take 1 capsule (60 mg total) by mouth 2 (two) times  daily. 180 capsule 0  . EPINEPHrine (EPIPEN 2-PAK) 0.3 mg/0.3 mL IJ SOAJ injection Inject 0.3 mg into the muscle as needed for anaphylaxis. 0.3 mL 1  . famotidine (PEPCID) 20 MG tablet Take 1 tablet (20 mg total) by mouth 2 (two) times daily as needed for heartburn or indigestion. 60 tablet 5  . metoprolol succinate (TOPROL XL) 25 MG 24 hr tablet Take 1 tablet (25 mg total) by mouth daily. 90 tablet 3  . montelukast (SINGULAIR) 10 MG tablet TAKE 1 TABLET(10 MG) BY MOUTH AT BEDTIME 30 tablet 5  . Multiple Vitamin (MULTI-VITAMIN) tablet Take by mouth.    . QUEtiapine (SEROQUEL) 50 MG tablet TAKE 3 TABLETS(150 MG) BY MOUTH AT BEDTIME 90 tablet 2  . vitamin B-12 (CYANOCOBALAMIN) 500 MCG tablet Take 1 tablet (500 mcg total) by mouth daily. For Vitamin B-12 replacement    . vitamin E 1000 UNIT capsule Take 1,000 Units by mouth daily.    Arvid Right 150 MG injection INJECT 300 MG UNDER THE SKIN EVERY 14 DAYS 4 each 11    Allergies as of 09/07/2020 - Review Complete 09/07/2020  Allergen Reaction Noted  . Contrast media [iodinated diagnostic agents] Hives 09/28/2017  . Amoxicillin Hives 10/20/2010  . Bactrim [sulfamethoxazole-trimethoprim] Hives and Rash 01/09/2014  . Lamictal [lamotrigine] Hives and Rash 08/31/2014     Review of Systems:    Constitutional: No fever or chills Skin: No rash Cardiovascular: No chest pain Respiratory: No SOB  Gastrointestinal: See HPI and otherwise negative Genitourinary: No dysuria Neurological: No headache, dizziness or syncope Musculoskeletal: No new muscle or joint pain Hematologic: No bleeding Psychiatric: No history of depression or anxiety    Physical Exam:  Vital signs in last 24 hours: Temp:  [97.5 F (36.4 C)-98.2 F (36.8 C)] 98.2 F (36.8 C) (05/27 1030) Pulse Rate:  [55-65] 65 (05/27 1357) Resp:  [16-19] 19 (05/27 1357) BP: (  124-182)/(76-106) 127/76 (05/27 1357) SpO2:  [93 %-99 %] 94 % (05/27 1357) Weight:  [782 kg] 118 kg (05/27 1033)    General:   Pleasant Caucasian female appears to be in NAD, Well developed, Well nourished, alert and cooperative Head:  Normocephalic and atraumatic. Eyes:   PEERL, EOMI. No icterus. Conjunctiva pink. Ears:  Normal auditory acuity. Neck:  Supple Throat: Oral cavity and pharynx without inflammation, swelling or lesion.  Lungs: Respirations even and unlabored. Lungs clear to auscultation bilaterally.   No wheezes, crackles, or rhonchi.  Heart: Normal S1, S2. No MRG. Regular rate and rhythm. No peripheral edema, cyanosis or pallor.  Abdomen:  Soft, nondistended, mild right upper quadrant TTP with deep palpation. No rebound or guarding. Normal bowel sounds. No appreciable masses or hepatomegaly. Rectal:  Not performed.  Msk:  Symmetrical without gross deformities. Peripheral pulses intact.  Extremities:  Without edema, no deformity or joint abnormality.  Neurologic:  Alert and  oriented x4;  grossly normal neurologically.  Skin:   Dry and intact without significant lesions or rashes. Psychiatric: Demonstrates good judgement and reason without abnormal affect or behaviors.   LAB RESULTS: Recent Labs    09/07/20 1155  WBC 5.6  HGB 12.9  HCT 37.2  PLT 270   BMET Recent Labs    09/07/20 1155  NA 139  K 3.1*  CL 107  CO2 25  GLUCOSE 122*  BUN 6  CREATININE 0.60  CALCIUM 9.0   Hepatic Function Latest Ref Rng & Units 09/07/2020 08/09/2020 05/24/2018  Total Protein 6.5 - 8.1 g/dL 6.6 7.0 6.9  Albumin 3.5 - 5.0 g/dL 3.2(L) 4.1 4.3  AST 15 - 41 U/L 269(H) 21 12  ALT 0 - 44 U/L 464(H) 65(H) 22  Alk Phosphatase 38 - 126 U/L 708(H) 203(H) 83  Total Bilirubin 0.3 - 1.2 mg/dL 3.5(H) 0.5 0.4  Bilirubin, Direct 0.0 - 0.3 mg/dL - - -   STUDIES: US Abdomen Limited  Result Date: 09/07/2020 CLINICAL DATA:  Acute right upper quadrant abdominal pain. EXAM: ULTRASOUND ABDOMEN LIMITED RIGHT UPPER QUADRANT COMPARISON:  Aug 30, 2020.  Sep 05, 2016. FINDINGS: Gallbladder: Cholelithiasis is noted  without significant gallbladder wall thickening or pericholecystic fluid. No sonographic Murphy's sign is noted. Common bile duct: Diameter: 8 mm which is mildly dilated. Liver: 1.8 cm cyst is seen in left hepatic lobe. Mildly increased echogenicity of hepatic parenchyma is noted suggesting hepatic steatosis. Portal vein is patent on color Doppler imaging with normal direction of blood flow towards the liver. Other: None. IMPRESSION: Cholelithiasis is noted without evidence of cholecystitis. Common bile duct is mildly dilated at 8 mm. Correlation with liver function tests is recommended to rule out distal common bile duct obstruction. MRCP may be performed for further evaluation. Possible hepatic steatosis.  Left hepatic cyst. Electronically Signed   By: Marijo Conception M.D.   On: 09/07/2020 12:27     Impression / Plan:   Impression: 1.  Right upper quadrant/epigastric pain: For the past month or so off-and-on, dull and feels like she has been "punched", associated with nausea and weight loss of 14 pounds, imaging showing a dilated CBD and cholelithiasis with a dramatic increase in LFTs over the past month; concern for choledocholithiasis 2.  Elevated LFTs 3.  Dilated CBD on ultrasound  Plan: 1.  Ordered MRCP with contrast for further evaluation of possible choledocholithiasis.  2.  Agree with supportive measures 3.  ok for regular diet after MRI today 4.  Please await further  recommendations from Dr. Ardis Hughs later today.  Thank you for your kind consultation, we will continue to follow.  Lavone Nian Lemmon  09/07/2020, 2:00 PM   ________________________________________________________________________  Velora Heckler GI MD note:  I personally examined the patient, reviewed the data and agree with the assessment and plan described above.  Likely this is gallstone disease however acute hepatitis can present similarly (minus the dilated bile duct on [place]). I am going to order an acute viral hepatitis  panel, ANA, AMA.  She has had Roux en Y bariatric surgery and that significantly complicates completing an ERCP. I have no experience with Roux en Y anatomy ERCPs and so she would need to transfer to a tertiary facility if MRI shows bile duct stones. She is aware of that possibility.  Owens Loffler, MD [facility] Pager [phone]

## 2020-09-07 NOTE — ED Triage Notes (Signed)
Patient reports that she has gallstones and went to her PCP today because of increased pain in the RUQ, light colored stools, and orange/brown urine. Patient was sent by [facility] for possible surgery.

## 2020-09-07 NOTE — H&P (Signed)
History and Physical    Brittany [name] EGB:[id number] DOB: 28-Feb-1966 DOA: 09/07/2020  PCP: [name], FNP  Patient coming from: Home  Chief Complaint: abdominal pain  HPI: Brittany [name] is a 55 y.o. female with medical history significant of HLD, anxiety, GERD. Presenting with abdominal pain and abnormal urine. The patient reports about a month ago she was seen for an annual physical. At the time, lab work was drawn. It showed elevated LFTs. It was decided at the time to trend the values. She then began having mild intermittent RUQ abdominal pain. An abdominal US was obtained that showed gallstones. She was referred to surgery for follow up, but has not yet been able to go. Over the last week or so, she has become increasingly fatigued. She has noticed that her stool is pale and her urine has become significantly darker. She became concerned that her skin was looking yellow. She saw her PCP today. More lab work was drawn. She was found to have worsening LFTs and was referred to the ED for further evaluation.     ED Course: She was found to have elevated AST/ALT, alk phos and t bili. Abdominal US showed gallstones and a dilated CBD. GI was consulted. TRH was called for admission.    Review of Systems: Denies CP, palpitations, dyspnea, N/V/D, fevers, sick contacts. Reports decreased appetite. Review of systems is otherwise negative for all not mentioned in HPI.   PMHx Past Medical History:  Diagnosis Date  . Allergy   . Angio-edema   . Anxiety   . Arthritis    hips, knees and hands   . Blood transfusion without reported diagnosis   . Depression   . GERD (gastroesophageal reflux disease)    hx of   . H/O hiatal hernia   . History of chicken pox   . Hypercholesterolemia   . Hypertension   . Migraine   . Migraines   . Morbid obesity ([place])   . Positive TB test   . PTSD (post-traumatic stress disorder)   . Sleep apnea    no CPAP machine   . Ulcer, stomach peptic   .  Urticaria     PSHx Past Surgical History:  Procedure Laterality Date  . ABDOMINAL HYSTERECTOMY  1997 & 2006  . BREATH TEK H PYLORI  05/03/2012   Procedure: BREATH TEK H PYLORI;  Surgeon: [name], MD;  Location: [name];  Service: General;  Laterality: N/A;  . COLONOSCOPY     hx of benign polyps   . ESOPHAGOGASTRODUODENOSCOPY N/A 09/29/2017   Procedure: ESOPHAGOGASTRODUODENOSCOPY (EGD);  Surgeon: Ladene Artist, MD;  Location: [name];  Service: Endoscopy;  Laterality: N/A;  . FOOT SURGERY    . GASTRIC BYPASS    . GASTRIC ROUX-EN-Y N/A 07/05/2012   Procedure: LAPAROSCOPIC ROUX-EN-Y GASTRIC;  Surgeon: [name], MD;  Location: [facility];  Service: General;  Laterality: N/A;  . IRRIGATION AND DEBRIDEMENT ABSCESS N/A 01/01/2014   Procedure: IRRIGATION AND DEBRIDEMENT ABSCESS;  Surgeon: [name], MD;  Location: [facility];  Service: General;  Laterality: N/A;  . LAPAROSCOPIC LYSIS OF ADHESIONS N/A 07/05/2012   Procedure: LAPAROSCOPIC LYSIS OF ADHESIONS;  Surgeon: [name], MD;  Location: [facility];  Service: General;  Laterality: N/A;  repair of abdominal wall hernia  . right tube and ovary removed     . UPPER GI ENDOSCOPY N/A 07/05/2012   Procedure: UPPER GI ENDOSCOPY;  Surgeon: [name], MD;  Location: [name];  Service: General;  Laterality: N/A;    SocHx  reports that she quit smoking about 16 years ago. Her smoking use included cigarettes. She has a 0.10 pack-year smoking history. She has never used smokeless tobacco. She reports current alcohol use. She reports that she does not use drugs.  Allergies  Allergen Reactions  . Contrast Media [Iodinated Diagnostic Agents] Hives  . Amoxicillin Hives    .Marland KitchenHas patient had a PCN reaction causing immediate rash, facial/tongue/throat swelling, SOB or lightheadedness with hypotension: Yes Has patient had a PCN reaction causing severe rash involving mucus membranes or skin necrosis: No Has patient had a PCN reaction  that required hospitalization No Has patient had a PCN reaction occurring within the last 10 years: No If all of the above answers are "NO", then may proceed with Cephalosporin use.   . Bactrim [Sulfamethoxazole-Trimethoprim] Hives and Rash  . Lamictal [Lamotrigine] Hives and Rash    FamHx Family History  Problem Relation Age of Onset  . Colon cancer Mother 68  . Depression Father   . Diabetes Father   . Heart disease Father   . Bladder Cancer Father   . Depression Sister   . Post-traumatic stress disorder Sister   . Alcohol abuse Paternal Uncle   . Bipolar disorder Cousin   . Anxiety disorder Sister   . Eczema Sister   . Cancer Maternal Aunt        breast  . Breast cancer Maternal Aunt   . Melanoma Maternal Grandmother   . Colon cancer Maternal Grandmother        does not know age of onset  . Healthy Maternal Grandfather   . Healthy Paternal Grandmother   . Heart disease Paternal Grandfather   . Stroke Paternal Grandfather   . Diabetes Paternal Grandfather     Prior to Admission medications   Medication Sig Start Date End Date Taking? Authorizing Provider  calcium-vitamin D (OSCAL WITH D) 500-200 MG-UNIT tablet Take 3 tablets by mouth every morning. For bone health Patient taking differently: Take 1 tablet by mouth every evening. For bone health 09/17/15  Yes Lindell Spar I, NP  cetirizine (ZYRTEC) 10 MG tablet Take 10 mg by mouth 2 (two) times daily.   Yes [provider]  cholecalciferol (VITAMIN D) 1000 units tablet Take 1 tablet (1,000 Units total) by mouth daily. For bone health 09/17/15  Yes Lindell Spar I, NP  DULoxetine (CYMBALTA) 60 MG capsule Take 1 capsule (60 mg total) by mouth 2 (two) times daily. 08/29/20  Yes Arfeen, Arlyce Harman, MD  EPINEPHrine (EPIPEN 2-PAK) 0.3 mg/0.3 mL IJ SOAJ injection Inject 0.3 mg into the muscle as needed for anaphylaxis. 07/09/20  Yes Garnet Sierras, DO  famotidine (PEPCID) 20 MG tablet Take 1 tablet (20 mg total) by mouth 2 (two) times  daily as needed for heartburn or indigestion. Patient taking differently: Take 20 mg by mouth daily. 11/08/18  Yes Bobbitt, [place] Muta, MD  metoprolol succinate (TOPROL XL) 25 MG 24 hr tablet Take 1 tablet (25 mg total) by mouth daily. 08/31/20  Yes Tobb, Kardie, DO  montelukast (SINGULAIR) 10 MG tablet TAKE 1 TABLET(10 MG) BY MOUTH AT BEDTIME Patient taking differently: Take 10 mg by mouth at bedtime. 03/26/20  Yes Garnet Sierras, DO  Multiple Vitamin (MULTI-VITAMIN) tablet Take 1 tablet by mouth daily.   Yes [provider]  QUEtiapine (SEROQUEL) 50 MG tablet TAKE 3 TABLETS(150 MG) BY MOUTH AT BEDTIME Patient taking differently: Take 150 mg  by mouth at bedtime. 08/29/20  Yes Arfeen, Arlyce Harman, MD  vitamin B-12 (CYANOCOBALAMIN) 500 MCG tablet Take 1 tablet (500 mcg total) by mouth daily. For Vitamin B-12 replacement Patient taking differently: Take 500 mcg by mouth daily. 09/17/15  Yes Lindell Spar I, NP  vitamin E 1000 UNIT capsule Take 1,000 Units by mouth daily.   Yes [provider]  XOLAIR 150 MG injection INJECT 300 MG UNDER THE SKIN EVERY 14 DAYS Patient taking differently: Inject 150 mg into the skin every 28 (twenty-eight) days. 06/21/20  Yes Garnet Sierras, DO    Physical Exam: Vitals:   09/07/20 1310 09/07/20 1357 09/07/20 1500 09/07/20 1556  BP: (!) 182/106 127/76 (!) 154/94 (!) 144/94  Pulse: (!) 55 65 62 69  Resp: '19 19 18 19  ' Temp:      TempSrc:      SpO2: 99% 94% 99% 99%  Weight:      Height:        General: 55 y.o. female resting in bed in NAD Eyes: PERRL, normal sclera ENMT: Nares patent w/o discharge, orophaynx clear, dentition normal, ears w/o discharge/lesions/ulcers Neck: Supple, trachea midline Cardiovascular: RRR, +S1, S2, no m/g/r, equal pulses throughout Respiratory: CTABL, no w/r/r, normal WOB GI: BS+, NDNT, no masses noted, no organomegaly noted MSK: No e/c/c Skin: No rashes, bruises, ulcerations noted; slight jaundice noted Neuro: A&O x 3, no  focal deficits Psyc: Appropriate interaction and affect, calm/cooperative  Labs on Admission: I have personally reviewed following labs and imaging studies  CBC: Recent Labs  Lab 09/07/20 1155  WBC 5.6  HGB 12.9  HCT 37.2  MCV 97.4  PLT 675   Basic Metabolic Panel: Recent Labs  Lab 09/07/20 1155  NA 139  K 3.1*  CL 107  CO2 25  GLUCOSE 122*  BUN 6  CREATININE 0.60  CALCIUM 9.0   GFR: Estimated Creatinine Clearance: 109 mL/min (by C-G formula based on SCr of 0.6 mg/dL). Liver Function Tests: Recent Labs  Lab 09/07/20 1155  AST 269*  ALT 464*  ALKPHOS 708*  BILITOT 3.5*  PROT 6.6  ALBUMIN 3.2*   Recent Labs  Lab 09/07/20 1155  LIPASE 17   No results for input(s): AMMONIA in the last 168 hours. Coagulation Profile: No results for input(s): INR, PROTIME in the last 168 hours. Cardiac Enzymes: No results for input(s): CKTOTAL, CKMB, CKMBINDEX, TROPONINI in the last 168 hours. BNP (last 3 results) No results for input(s): PROBNP in the last 8760 hours. HbA1C: No results for input(s): HGBA1C in the last 72 hours. CBG: No results for input(s): GLUCAP in the last 168 hours. Lipid Profile: No results for input(s): CHOL, HDL, LDLCALC, TRIG, CHOLHDL, LDLDIRECT in the last 72 hours. Thyroid Function Tests: No results for input(s): TSH, T4TOTAL, FREET4, T3FREE, THYROIDAB in the last 72 hours. Anemia Panel: No results for input(s): VITAMINB12, FOLATE, FERRITIN, TIBC, IRON, RETICCTPCT in the last 72 hours. Urine analysis:    Component Value Date/Time   COLORURINE AMBER (A) 09/07/2020 1355   APPEARANCEUR HAZY (A) 09/07/2020 1355   LABSPEC 1.020 09/07/2020 1355   PHURINE 5.0 09/07/2020 1355   GLUCOSEU NEGATIVE 09/07/2020 1355   HGBUR NEGATIVE 09/07/2020 1355   BILIRUBINUR SMALL (A) 09/07/2020 1355   BILIRUBINUR moderate (A) 09/07/2020 0905   KETONESUR 20 (A) 09/07/2020 1355   PROTEINUR 30 (A) 09/07/2020 1355   UROBILINOGEN 4.0 (A) 09/07/2020 0905   NITRITE  NEGATIVE 09/07/2020 [place] 09/07/2020 1355    Radiological  Exams on Admission: US Abdomen Limited  Result Date: 09/07/2020 CLINICAL DATA:  Acute right upper quadrant abdominal pain. EXAM: ULTRASOUND ABDOMEN LIMITED RIGHT UPPER QUADRANT COMPARISON:  Aug 30, 2020.  Sep 05, 2016. FINDINGS: Gallbladder: Cholelithiasis is noted without significant gallbladder wall thickening or pericholecystic fluid. No sonographic Murphy's sign is noted. Common bile duct: Diameter: 8 mm which is mildly dilated. Liver: 1.8 cm cyst is seen in left hepatic lobe. Mildly increased echogenicity of hepatic parenchyma is noted suggesting hepatic steatosis. Portal vein is patent on color Doppler imaging with normal direction of blood flow towards the liver. Other: None. IMPRESSION: Cholelithiasis is noted without evidence of cholecystitis. Common bile duct is mildly dilated at 8 mm. Correlation with liver function tests is recommended to rule out distal common bile duct obstruction. MRCP may be performed for further evaluation. Possible hepatic steatosis.  Left hepatic cyst. Electronically Signed   By: Marijo Conception M.D.   On: 09/07/2020 12:27    EKG: None obtained in the ED  Assessment/Plan Biliary obstruction Elevated LFTs Hyperbilirubinemia     - admit to inpt, med-surg     - [facility] onboard, appreciate assistance     - MRCP ordered; ERCP planned for tomorrow     - can have diet after MRCP; will be NPO pMN  Anxiety/Depression     - resume home regimen  GERD     - protonix, follow  HTN     - resume home regimen  Hypokalemia     - replace K+, check Mg2+  DVT prophylaxis: SCDs Code Status: FULL  Family Communication: None at bedside  Consults called: EDP spoke w/ [facility]   Status is: Inpatient  Remains inpatient appropriate because:Inpatient level of care appropriate due to severity of illness   Dispo: The patient is from: Home              Anticipated d/c is to: Home               Patient currently is not medically stable to d/c.   Difficult to place patient No  Jonnie Finner DO [facility]  If 7PM-7AM, please contact night-coverage [email or web address]  09/07/2020, 4:45 PM

## 2020-09-07 NOTE — Progress Notes (Signed)
MRI reviewed.  No CBD stones but there is a suggestion of a small mass near or within the distal bile duct, in head of pancreas.  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  I will discuss this with her tomorrow.  Likely to recommend outpatient attempt at EUS next week.  I recommend getting surgical consultation as well, she may need an eventual Whipple procedure.  She has had vague abd pains and has been losing weight.  My suspicion for neoplasm is elevated.

## 2020-09-07 NOTE — Progress Notes (Signed)
Brittany [name] is a 55 y.o. female with the following history as recorded in EpicCare:  Patient Active Problem List   Diagnosis Date Noted  . Urticaria   . Ulcer, stomach peptic   . Sleep apnea   . Positive TB test   . Migraines   . Hypercholesterolemia   . History of chicken pox   . H/O hiatal hernia   . Blood transfusion without reported diagnosis   . Arthritis   . Anxiety   . Angio-edema   . Allergy   . Allergic contact dermatitis 07/09/2020  . Chronic idiopathic urticaria 01/17/2019  . Seasonal and perennial allergic rhinoconjunctivitis 11/08/2018  . Allergic reaction 11/08/2018  . Diarrhea 11/08/2018  . Right thigh pain 05/19/2018  . Melena   . Acute gastrojejunal anastomotic ulcer   . Macrocytic anemia   . Acute GI bleeding 09/28/2017  . Epigastric abdominal pain 11/19/2016  . Incisional hernia with obstruction but no gangrene 07/21/2016  . Menopause 04/22/2016  . Routine general medical examination at a health care facility 03/18/2016  . Obesity 03/18/2016  . Severe episode of recurrent major depressive disorder, without psychotic features ([employer])   . Depression, major, severe recurrence ([place]) 09/11/2015  . Major depressive disorder, recurrent, severe without psychotic features ([place])   . MDD (major depressive disorder) 10/30/2014  . MDD (major depressive disorder), recurrent severe, without psychosis ([place])   . Suicidal ideation 10/05/2014  . Depression 06/12/2014  . Rash 01/04/2014  . Normocytic anemia 01/04/2014  . DJD (degenerative joint disease) 01/04/2014  . Hypertension 01/04/2014  . Dyslipidemia 01/04/2014  . GERD (gastroesophageal reflux disease) 01/04/2014  . Obstructive sleep apnea 01/04/2014  . Status post small bowel resection 01/04/2014  . Abdominal abscess 01/01/2014  . Wound infection after surgery 01/01/2014  . PTSD (post-traumatic stress disorder) 11/23/2013  . Severe recurrent major depression without psychotic features ([place]) 11/22/2013  .  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 Morbid obesity, Weight - 312, BMI - 45.2 04/21/2012    Current Outpatient Medications  Medication Sig Dispense Refill  . calcium-vitamin D (OSCAL WITH D) 500-200 MG-UNIT tablet Take 3 tablets by mouth every morning. For bone health (Patient taking differently: Take 3 tablets by mouth every evening. For bone health)    . cetirizine (ZYRTEC) 10 MG tablet Take 20 mg by mouth 2 (two) times daily.    . cholecalciferol (VITAMIN D) 1000 units tablet Take 1 tablet (1,000 Units total) by mouth daily. For bone health    . DULoxetine (CYMBALTA) 60 MG capsule Take 1 capsule (60 mg total) by mouth 2 (two) times daily. 180 capsule 0  . EPINEPHrine (EPIPEN 2-PAK) 0.3 mg/0.3 mL IJ SOAJ injection Inject 0.3 mg into the muscle as needed for anaphylaxis. 0.3 mL 1  . famotidine (PEPCID) 20 MG tablet Take 1 tablet (20 mg total) by mouth 2 (two) times daily as needed for heartburn or indigestion. 60 tablet 5  . metoprolol succinate (TOPROL XL) 25 MG 24 hr tablet Take 1 tablet (25 mg total) by mouth daily. 90 tablet 3  . montelukast (SINGULAIR) 10 MG tablet TAKE 1 TABLET(10 MG) BY MOUTH AT BEDTIME 30 tablet 5  . Multiple Vitamin (MULTI-VITAMIN) tablet Take by mouth.    . QUEtiapine (SEROQUEL) 50 MG tablet TAKE 3 TABLETS(150 MG) BY MOUTH AT BEDTIME 90 tablet 2  . vitamin B-12 (CYANOCOBALAMIN) 500 MCG tablet Take 1 tablet (500 mcg total) by mouth daily. For Vitamin B-12 replacement    . vitamin E  1000 UNIT capsule Take 1,000 Units by mouth daily.    Arvid Right 150 MG injection INJECT 300 MG UNDER THE SKIN EVERY 14 DAYS 4 each 11   Current Facility-Administered Medications  Medication Dose Route Frequency Provider Last Rate Last Admin  . omalizumab Arvid Right) injection 300 mg  300 mg Subcutaneous Q28 days Garnet Sierras, DO   300 mg at 08/13/20 1555    Allergies: Contrast media [iodinated diagnostic agents], Amoxicillin, Bactrim [sulfamethoxazole-trimethoprim], and  Lamictal [lamotrigine]  Past Medical History:  Diagnosis Date  . Allergy   . Angio-edema   . Anxiety   . Arthritis    hips, knees and hands   . Blood transfusion without reported diagnosis   . Depression   . GERD (gastroesophageal reflux disease)    hx of   . H/O hiatal hernia   . History of chicken pox   . Hypercholesterolemia   . Hypertension   . Migraine   . Migraines   . Morbid obesity ([place])   . Positive TB test   . PTSD (post-traumatic stress disorder)   . Sleep apnea    no CPAP machine   . Ulcer, stomach peptic   . Urticaria     Past Surgical History:  Procedure Laterality Date  . ABDOMINAL HYSTERECTOMY  1997 & 2006  . BREATH TEK H PYLORI  05/03/2012   Procedure: BREATH TEK H PYLORI;  Surgeon: [name], MD;  Location: [name];  Service: General;  Laterality: N/A;  . COLONOSCOPY     hx of benign polyps   . ESOPHAGOGASTRODUODENOSCOPY N/A 09/29/2017   Procedure: ESOPHAGOGASTRODUODENOSCOPY (EGD);  Surgeon: Ladene Artist, MD;  Location: [name];  Service: Endoscopy;  Laterality: N/A;  . FOOT SURGERY    . GASTRIC BYPASS    . GASTRIC ROUX-EN-Y N/A 07/05/2012   Procedure: LAPAROSCOPIC ROUX-EN-Y GASTRIC;  Surgeon: [name], MD;  Location: [facility];  Service: General;  Laterality: N/A;  . IRRIGATION AND DEBRIDEMENT ABSCESS N/A 01/01/2014   Procedure: IRRIGATION AND DEBRIDEMENT ABSCESS;  Surgeon: [name], MD;  Location: [facility];  Service: General;  Laterality: N/A;  . LAPAROSCOPIC LYSIS OF ADHESIONS N/A 07/05/2012   Procedure: LAPAROSCOPIC LYSIS OF ADHESIONS;  Surgeon: [name], MD;  Location: [facility];  Service: General;  Laterality: N/A;  repair of abdominal wall hernia  . right tube and ovary removed     . UPPER GI ENDOSCOPY N/A 07/05/2012   Procedure: UPPER GI ENDOSCOPY;  Surgeon: [name], MD;  Location: [facility];  Service: General;  Laterality: N/A;    Family History  Problem Relation Age of Onset  . Colon cancer Mother 13  .  Depression Father   . Diabetes Father   . Heart disease Father   . Bladder Cancer Father   . Depression Sister   . Post-traumatic stress disorder Sister   . Alcohol abuse Paternal Uncle   . Bipolar disorder Cousin   . Anxiety disorder Sister   . Eczema Sister   . Cancer Maternal Aunt        breast  . Breast cancer Maternal Aunt   . Melanoma Maternal Grandmother   . Colon cancer Maternal Grandmother        does not know age of onset  . Healthy Maternal Grandfather   . Healthy Paternal Grandmother   . Heart disease Paternal Grandfather   . Stroke Paternal Grandfather   . Diabetes Paternal Grandfather     Social History   Tobacco  Use  . Smoking status: Former Smoker    Packs/day: 0.10    Years: 1.00    Pack years: 0.10    Types: Cigarettes    Quit date: 04/27/2004    Years since quitting: 16.3  . Smokeless tobacco: Never Used  Substance Use Topics  . Alcohol use: Yes    Alcohol/week: 0.0 standard drinks    Comment: Occasional use/ 2 drinks a month    Subjective:  At recent OV, patient had elevated LFTs/ was found to have gallstones; surgical consult is pending but patient has not been contacted to schedule; Notes that recently she has been feeling worse and having increased abdominal pain; has noticed abnormal color to her urine; also questions if her skin looks yellow- has been concerned that her coloring "is not right."   Objective:  Vitals:   09/07/20 0835  BP: 124/80  Pulse: 60  Temp: (!) 97.5 F (36.4 C)  TempSrc: Oral  SpO2: 97%  Weight: 260 lb 3.2 oz (118 kg)  Height: _0  (1.753 m)    General: Well developed, well nourished, in no acute distress  Skin : Warm and dry. Yellow color noted over upper chest Head: Normocephalic and atraumatic  Eyes: Sclera and conjunctiva mild yellow; pupils round and reactive to light; extraocular movements intact  Ears: External normal; canals clear; tympanic membranes normal  Oropharynx: Pink, supple. No suspicious lesions   Neck: Supple without thyromegaly, adenopathy  Lungs: Respirations unlabored;  Neurologic: Alert and oriented; speech intact; face symmetrical; moves all extremities well; CNII-XII intact without focal deficit   Assessment:  1. Jaundice   2. Abdominal pain, unspecified abdominal location     Plan:  Suspect secondary to gallstones; urine color is concerning as well; patient is sent to ER for further evaluation; she will plan to go to [facility] and understands that surgeon will contacted. I did speak to the triage nurse at [facility] and gave them patient's name so they were aware to be looking for consult from ER.  Time coordinating and managing care 30 minutes  This visit occurred during the SARS-CoV-2 public health emergency.  Safety protocols were in place, including screening questions prior to the visit, additional usage of staff PPE, and extensive cleaning of exam room while observing appropriate contact time as indicated for disinfecting solutions.     No follow-ups on file.  Orders Placed This Encounter  Procedures  . Urine Culture  . CBC with Differential/Platelet  . Comp Met (CMET)  . POCT urinalysis dipstick    Requested Prescriptions    No prescriptions requested or ordered in this encounter

## 2020-09-07 NOTE — ED Notes (Signed)
Patient has gone to MRI

## 2020-09-08 ENCOUNTER — Encounter (HOSPITAL_COMMUNITY)
Admission: EM | Disposition: A | Discharge status: Home Health | Payer: Self-pay | Source: Home / Self Care | Attending: Family Medicine

## 2020-09-08 ENCOUNTER — Inpatient Hospital Stay (HOSPITAL_COMMUNITY): Payer: 59

## 2020-09-08 DIAGNOSIS — R7989 Other specified abnormal findings of blood chemistry: Secondary | ICD-10-CM | POA: Diagnosis not present

## 2020-09-08 DIAGNOSIS — R17 Unspecified jaundice: Secondary | ICD-10-CM

## 2020-09-08 DIAGNOSIS — K219 Gastro-esophageal reflux disease without esophagitis: Secondary | ICD-10-CM | POA: Diagnosis not present

## 2020-09-08 DIAGNOSIS — K8050 Calculus of bile duct without cholangitis or cholecystitis without obstruction: Secondary | ICD-10-CM | POA: Diagnosis not present

## 2020-09-08 DIAGNOSIS — K831 Obstruction of bile duct: Secondary | ICD-10-CM | POA: Insufficient documentation

## 2020-09-08 HISTORY — PX: IR BILIARY DRAIN PLACEMENT WITH CHOLANGIOGRAM: IMG6043

## 2020-09-08 LAB — CBC WITH DIFFERENTIAL/PLATELET
Abs Immature Granulocytes: 0.01 10*3/uL (ref 0.00–0.07)
Basophils Absolute: 0 10*3/uL (ref 0.0–0.1)
Basophils Relative: 0 %
Eosinophils Absolute: 0.1 10*3/uL (ref 0.0–0.5)
Eosinophils Relative: 3 %
HCT: 33 % — ABNORMAL LOW (ref 36.0–46.0)
Hemoglobin: 11.4 g/dL — ABNORMAL LOW (ref 12.0–15.0)
Immature Granulocytes: 0 %
Lymphocytes Relative: 29 %
Lymphs Abs: 1.3 10*3/uL (ref 0.7–4.0)
MCH: 33.6 pg (ref 26.0–34.0)
MCHC: 34.5 g/dL (ref 30.0–36.0)
MCV: 97.3 fL (ref 80.0–100.0)
Monocytes Absolute: 0.4 10*3/uL (ref 0.1–1.0)
Monocytes Relative: 10 %
Neutro Abs: 2.5 10*3/uL (ref 1.7–7.7)
Neutrophils Relative %: 58 %
Platelets: 244 10*3/uL (ref 150–400)
RBC: 3.39 MIL/uL — ABNORMAL LOW (ref 3.87–5.11)
RDW: 13.7 % (ref 11.5–15.5)
WBC: 4.4 10*3/uL (ref 4.0–10.5)
nRBC: 0 % (ref 0.0–0.2)

## 2020-09-08 LAB — HEPATITIS PANEL, ACUTE
HCV Ab: NONREACTIVE
Hep A IgM: NONREACTIVE
Hep B C IgM: NONREACTIVE
Hepatitis B Surface Ag: NONREACTIVE

## 2020-09-08 LAB — COMPREHENSIVE METABOLIC PANEL
ALT: 403 U/L — ABNORMAL HIGH (ref 0–44)
AST: 262 U/L — ABNORMAL HIGH (ref 15–41)
Albumin: 2.8 g/dL — ABNORMAL LOW (ref 3.5–5.0)
Alkaline Phosphatase: 692 U/L — ABNORMAL HIGH (ref 38–126)
Anion gap: 9 (ref 5–15)
BUN: 6 mg/dL (ref 6–20)
CO2: 25 mmol/L (ref 22–32)
Calcium: 8.6 mg/dL — ABNORMAL LOW (ref 8.9–10.3)
Chloride: 105 mmol/L (ref 98–111)
Creatinine, Ser: 0.38 mg/dL — ABNORMAL LOW (ref 0.44–1.00)
GFR, Estimated: >60 mL/min (ref >60)
Glucose, Bld: 107 mg/dL — ABNORMAL HIGH (ref 70–99)
Potassium: 3.5 mmol/L (ref 3.5–5.1)
Sodium: 139 mmol/L (ref 135–145)
Total Bilirubin: 6.5 mg/dL — ABNORMAL HIGH (ref 0.3–1.2)
Total Protein: 6 g/dL — ABNORMAL LOW (ref 6.5–8.1)

## 2020-09-08 LAB — URINE CULTURE
MICRO NUMBER:: 11943618
SPECIMEN QUALITY:: ADEQUATE

## 2020-09-08 LAB — HIV ANTIBODY (ROUTINE TESTING W REFLEX): HIV Screen 4th Generation wRfx: NONREACTIVE

## 2020-09-08 SURGERY — ERCP, WITH INTERVENTION IF INDICATED
Anesthesia: General

## 2020-09-08 MED ORDER — HYDROMORPHONE HCL 1 MG/ML IJ SOLN
1.0000 mg | INTRAMUSCULAR | Status: DC | PRN
  Administered 2020-09-08 – 2020-09-09 (×3): 1 mg via INTRAVENOUS
  Filled 2020-09-08 (×3): qty 1

## 2020-09-08 MED ORDER — LIDOCAINE HCL (PF) 1 % IJ SOLN
INTRAMUSCULAR | Status: AC | PRN
  Administered 2020-09-08: 10 mL

## 2020-09-08 MED ORDER — FENTANYL CITRATE (PF) 100 MCG/2ML IJ SOLN
INTRAMUSCULAR | Status: AC
  Filled 2020-09-08: qty 4

## 2020-09-08 MED ORDER — HYDROMORPHONE HCL 1 MG/ML IJ SOLN
INTRAMUSCULAR | Status: AC
  Filled 2020-09-08: qty 1

## 2020-09-08 MED ORDER — METHYLPREDNISOLONE SODIUM SUCC 40 MG IJ SOLR
40.0000 mg | Freq: Once | INTRAMUSCULAR | Status: AC
  Administered 2020-09-08: 40 mg via INTRAVENOUS
  Filled 2020-09-08: qty 1

## 2020-09-08 MED ORDER — LIDOCAINE HCL (PF) 1 % IJ SOLN
INTRAMUSCULAR | Status: AC
  Filled 2020-09-08: qty 30

## 2020-09-08 MED ORDER — MIDAZOLAM HCL 2 MG/2ML IJ SOLN
INTRAMUSCULAR | Status: AC | PRN
  Administered 2020-09-08 (×3): 1 mg via INTRAVENOUS
  Administered 2020-09-08: 2 mg via INTRAVENOUS
  Administered 2020-09-08 (×3): 1 mg via INTRAVENOUS

## 2020-09-08 MED ORDER — FENTANYL CITRATE (PF) 100 MCG/2ML IJ SOLN
INTRAMUSCULAR | Status: AC | PRN
  Administered 2020-09-08 (×6): 50 ug via INTRAVENOUS

## 2020-09-08 MED ORDER — LIP MEDEX EX OINT
TOPICAL_OINTMENT | CUTANEOUS | Status: AC
  Filled 2020-09-08: qty 7

## 2020-09-08 MED ORDER — DIPHENHYDRAMINE HCL 50 MG/ML IJ SOLN
50.0000 mg | Freq: Once | INTRAMUSCULAR | Status: AC
  Administered 2020-09-08: 50 mg via INTRAVENOUS
  Filled 2020-09-08: qty 1

## 2020-09-08 MED ORDER — MIDAZOLAM HCL 2 MG/2ML IJ SOLN
INTRAMUSCULAR | Status: AC
  Filled 2020-09-08: qty 6

## 2020-09-08 MED ORDER — FENTANYL CITRATE (PF) 100 MCG/2ML IJ SOLN
INTRAMUSCULAR | Status: AC
  Filled 2020-09-08: qty 2

## 2020-09-08 MED ORDER — HYDROMORPHONE HCL 1 MG/ML IJ SOLN
INTRAMUSCULAR | Status: AC | PRN
  Administered 2020-09-08: 1 mg via INTRAVENOUS

## 2020-09-08 MED ORDER — PIPERACILLIN-TAZOBACTAM 3.375 G IVPB
INTRAVENOUS | Status: AC
  Administered 2020-09-08: 3.375 g
  Filled 2020-09-08: qty 50

## 2020-09-08 MED ORDER — MIDAZOLAM HCL 2 MG/2ML IJ SOLN
INTRAMUSCULAR | Status: AC
  Filled 2020-09-08: qty 2

## 2020-09-08 MED ORDER — LORAZEPAM 2 MG/ML IJ SOLN
INTRAMUSCULAR | Status: AC | PRN
  Administered 2020-09-08: 1 mg via INTRAVENOUS

## 2020-09-08 NOTE — Progress Notes (Signed)
[facility] Progress Note    Since last GI note: She feels well. NO abd pains.  Objective: Vital signs in last 24 hours: Temp:  [97.9 F (36.6 C)-98.5 F (36.9 C)] 98.5 F (36.9 C) (05/28 0150) Pulse Rate:  [53-69] 63 (05/28 0605) Resp:  [14-19] 15 (05/28 0605) BP: (127-182)/(68-106) 149/87 (05/28 0605) SpO2:  [93 %-99 %] 94 % (05/28 0605) Weight:  [774 kg] 118 kg (05/27 1033) Last BM Date: 09/07/20 General: alert and oriented times 3 Heart: regular rate and rythm Abdomen: soft, non-tender, non-distended, normal bowel sounds   Lab Results: Recent Labs    09/07/20 1155 09/08/20 0349  WBC 5.6 4.4  HGB 12.9 11.4*  PLT 270 244  MCV 97.4 97.3   Recent Labs    09/07/20 1155 09/08/20 0349  NA 139 139  K 3.1* 3.5  CL 107 105  CO2 25 25  GLUCOSE 122* 107*  BUN 6 6  CREATININE 0.60 0.38*  CALCIUM 9.0 8.6*   Recent Labs    09/07/20 1155 09/08/20 0349  PROT 6.6 6.0*  ALBUMIN 3.2* 2.8*  AST 269* 262*  ALT 464* 403*  ALKPHOS 708* 692*  BILITOT 3.5* 6.5*   Recent Labs    09/07/20 1845  INR 0.9    Studies/Results: MR 3D Recon At Scanner  Result Date: 09/07/2020 CLINICAL DATA:  55 year old female with history of right upper quadrant abdominal pain. Common bile duct dilatation noted on ultrasound examination. Follow-up study. EXAM: MRI ABDOMEN WITHOUT AND WITH CONTRAST (INCLUDING MRCP) TECHNIQUE: Multiplanar multisequence MR imaging of the abdomen was performed both before and after the administration of intravenous contrast. Heavily T2-weighted images of the biliary and pancreatic ducts were obtained, and three-dimensional MRCP images were rendered by post processing. CONTRAST:  83m GADAVIST GADOBUTROL 1 MMOL/ML IV SOLN COMPARISON:  No prior abdominal MRI. Abdominal ultrasound 09/07/2020. FINDINGS: Lower chest: Unremarkable. Hepatobiliary: In segment 2 of the liver (axial image 10 of series 15) there is a 2.3 x 2.0 cm T1 hypointense, T2 hyperintense  lesion which demonstrates early peripheral nodular hyperenhancement with centripetal filling on delayed images, diagnostic of a cavernous hemangioma. No other suspicious cystic or solid hepatic lesions. Moderate intrahepatic biliary ductal dilatation noted on MRCP images. Common bile duct is dilated measuring 14 mm in the porta hepatis. Abrupt cut off of the common bile duct (coronal MRCP image 35 of series 8), favored to reflect a stricture. No definite filling defect within the common bile duct to suggest choledocholithiasis. There are numerous filling defects within the gallbladder, compatible with tiny gallstones. Gallbladder is moderately distended. Gallbladder wall thickness is normal. No pericholecystic fluid or surrounding inflammatory changes. Pancreas: Subtle increased enhancement is noted in the head of the pancreas immediately inferior to the apparent stricture in the common bile duct, best appreciated on axial images 40 7-49 of series 25. No clearly definable mass is evident in this region, however, ductal lesion or periductal lesion is difficult to entirely exclude. No pancreatic ductal dilatation noted on MRCP images. No peripancreatic fluid collections or inflammatory changes. Spleen: Lobular contour of the lateral aspect of the spleen, potentially sequela of prior trauma or prior infarctions. Adrenals/Urinary Tract: Bilateral kidneys and adrenal glands are normal in appearance. No hydroureteronephrosis in the visualized portions of the abdomen. Stomach/Bowel: Ventral hernia containing a portion of the transverse colon, incompletely imaged. Visualized portions are otherwise unremarkable. Vascular/Lymphatic: No aneurysm identified in the visualized abdominal vasculature. No lymphadenopathy noted in the abdomen. Other: No significant volume of ascites noted in  the visualized portions of the peritoneal cavity. Musculoskeletal: No aggressive appearing osseous lesions are noted in the visualized portions  of the skeleton. IMPRESSION: 1. Moderate intra and extrahepatic biliary ductal dilatation with what appears to be a stricture of the mid common bile duct. Notably, there are some areas of mildly increased enhancement adjacent to the mid to distal common bile duct in the head of the pancreas. Although no discrete pancreatic mass is confidently identified, the possibility of a very small ductal or periductal lesion is not excluded. Further evaluation with endoscopic ultrasound is recommended in the near future to exclude underlying neoplasm. 2. Cholelithiasis without evidence of acute cholecystitis. No choledocholithiasis is noted at this time. 3. 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 4. Ventral hernia containing a portion of the transverse colon incompletely imaged. Electronically Signed   By: Vinnie Langton M.D.   On: 09/07/2020 19:10   US Abdomen Limited  Result Date: 09/07/2020 CLINICAL DATA:  Acute right upper quadrant abdominal pain. EXAM: ULTRASOUND ABDOMEN LIMITED RIGHT UPPER QUADRANT COMPARISON:  Aug 30, 2020.  Sep 05, 2016. FINDINGS: Gallbladder: Cholelithiasis is noted without significant gallbladder wall thickening or pericholecystic fluid. No sonographic Murphy's sign is noted. Common bile duct: Diameter: 8 mm which is mildly dilated. Liver: 1.8 cm cyst is seen in left hepatic lobe. Mildly increased echogenicity of hepatic parenchyma is noted suggesting hepatic steatosis. Portal vein is patent on color Doppler imaging with normal direction of blood flow towards the liver. Other: None. IMPRESSION: Cholelithiasis is noted without evidence of cholecystitis. Common bile duct is mildly dilated at 8 mm. Correlation with liver function tests is recommended to rule out distal common bile duct obstruction. MRCP may be performed for further evaluation. Possible hepatic steatosis.  Left hepatic cyst. Electronically Signed   By: Marijo Conception M.D.   On: 09/07/2020 12:27    MR ABDOMEN MRCP W WO CONTAST  Result Date: 09/07/2020 CLINICAL DATA:  55 year old female with history of right upper quadrant abdominal pain. Common bile duct dilatation noted on ultrasound examination. Follow-up study. EXAM: MRI ABDOMEN WITHOUT AND WITH CONTRAST (INCLUDING MRCP) TECHNIQUE: Multiplanar multisequence MR imaging of the abdomen was performed both before and after the administration of intravenous contrast. Heavily T2-weighted images of the biliary and pancreatic ducts were obtained, and three-dimensional MRCP images were rendered by post processing. CONTRAST:  37m GADAVIST GADOBUTROL 1 MMOL/ML IV SOLN COMPARISON:  No prior abdominal MRI. Abdominal ultrasound 09/07/2020. FINDINGS: Lower chest: Unremarkable. Hepatobiliary: In segment 2 of the liver (axial image 10 of series 15) there is a 2.3 x 2.0 cm T1 hypointense, T2 hyperintense lesion which demonstrates early peripheral nodular hyperenhancement with centripetal filling on delayed images, diagnostic of a cavernous hemangioma. No other suspicious cystic or solid hepatic lesions. Moderate intrahepatic biliary ductal dilatation noted on MRCP images. Common bile duct is dilated measuring 14 mm in the porta hepatis. Abrupt cut off of the common bile duct (coronal MRCP image 35 of series 8), favored to reflect a stricture. No definite filling defect within the common bile duct to suggest choledocholithiasis. There are numerous filling defects within the gallbladder, compatible with tiny gallstones. Gallbladder is moderately distended. Gallbladder wall thickness is normal. No pericholecystic fluid or surrounding inflammatory changes. Pancreas: Subtle increased enhancement is noted in the head of the pancreas immediately inferior to the apparent stricture in the common bile duct, best appreciated on axial images 40 7-49 of series 25. No clearly definable mass is evident in this  region, however, ductal lesion or periductal lesion is difficult to  entirely exclude. No pancreatic ductal dilatation noted on MRCP images. No peripancreatic fluid collections or inflammatory changes. Spleen: Lobular contour of the lateral aspect of the spleen, potentially sequela of prior trauma or prior infarctions. Adrenals/Urinary Tract: Bilateral kidneys and adrenal glands are normal in appearance. No hydroureteronephrosis in the visualized portions of the abdomen. Stomach/Bowel: Ventral hernia containing a portion of the transverse colon, incompletely imaged. Visualized portions are otherwise unremarkable. Vascular/Lymphatic: No aneurysm identified in the visualized abdominal vasculature. No lymphadenopathy noted in the abdomen. Other: No significant volume of ascites noted in the visualized portions of the peritoneal cavity. Musculoskeletal: No aggressive appearing osseous lesions are noted in the visualized portions of the skeleton. IMPRESSION: 1. Moderate intra and extrahepatic biliary ductal dilatation with what appears to be a stricture of the mid common bile duct. Notably, there are some areas of mildly increased enhancement adjacent to the mid to distal common bile duct in the head of the pancreas. Although no discrete pancreatic mass is confidently identified, the possibility of a very small ductal or periductal lesion is not excluded. Further evaluation with endoscopic ultrasound is recommended in the near future to exclude underlying neoplasm. 2. Cholelithiasis without evidence of acute cholecystitis. No choledocholithiasis is noted at this time. 3. 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 4. Ventral hernia containing a portion of the transverse colon incompletely imaged. Electronically Signed   By: Vinnie Langton M.D.   On: 09/07/2020 19:10     Medications: Scheduled Meds: . cholecalciferol  1,000 Units Oral Daily  . DULoxetine  60 mg Oral BID  . loratadine  10 mg Oral Daily  . metoprolol succinate  25 mg Oral Daily  .  montelukast  10 mg Oral QHS  . multivitamin with minerals  1 tablet Oral Daily  . QUEtiapine  150 mg Oral QHS  . Vitamin E  800 Units Oral Daily   And  . vitamin E  200 Units Oral Daily   Continuous Infusions: . sodium chloride 20 mL/hr at 09/08/20 0800  . lactated ringers 125 mL/hr at 09/08/20 0800   PRN Meds:.acetaminophen **OR** acetaminophen, ondansetron **OR** ondansetron (ZOFRAN) IV    Assessment/Plan: 55 y.o. female with a distal bile duct stricture  Neoplastic? Inflammatory? Stone? Difficult to know from MRI. I spoke at length with her this AM, Drew biliary diagram for her. 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  I discussed her case with IR Dr. Serafina Royals and he agrees that [facility] is a reasonable next step. I have formally consulted IR via Epic orders. She has not eaten yet this AM, I officially ordered NPO for now in case the PTC can happen today.  She understands that PTC may or may not be able to sample and proven an underlying diagnosis. I would attempt EUS on this coming Thursday (outpatient likely) if there is still a need. She understands there anatomy may render EUS unhelpful but that it would certainly be worth a try.   [name], MD  09/08/2020, 8:56 AM [facility] Pager [phone]

## 2020-09-08 NOTE — Progress Notes (Signed)
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 Right upper quadrant and epigastric pain.  Found to have elevated LFTs and dilated common bile duct.  MRCP negative for CBD stone but concerning for pancreatic mass.  ERCP not possible secondary to anatomy per surgery.  Therefore IR consulted for consideration of tissue sampling and drain placement.  A & P  Jaundice, elevated LFTs, concern for pancreatic mass.  Hepatitis panel negative. --Transaminases stable.  Total bilirubin elevated.  Discussed with Dr. Ardis Hughs.  Needs decompression.  IR consulted. --Plan for IR intervention, drain placement and possible tissue sampling today.  Anxiety, depression --Stable. Continue midazolam.  GERD --Protonix  Disposition Plan:  Discussion:   Status is: Inpatient  Remains inpatient appropriate because:Inpatient level of care appropriate due to severity of illness   Dispo: The patient is from: Home              Anticipated d/c is to: Home              Patient currently is not medically stable to d/c.   Difficult to place patient No  DVT prophylaxis: SCDs Start: 09/07/20 1938   Code Status: Full Code Level of care: Med-Surg Family Communication: none  [name], MD  [facility] Direct contact: see [email or web address] (further directions at bottom of note if needed) 7PM-7AM contact night coverage as at bottom of note 09/08/2020, 2:06 PM  LOS: 1 day   Significant Hospital Events   . 527 admit for jaundice   Consults:  . GI . IR   Procedures:  .   Significant Diagnostic Tests:  Marland Kitchen    Micro Data:  .    Antimicrobials:  .   Interval History/Subjective  CC: f/u abd pain  Feels fine now, no pain  Objective   Vitals:  Vitals:   09/08/20 1355 09/08/20 1400  BP: 139/84 137/81  Pulse: 68 64  Resp: 12 13  Temp:    SpO2: 94% 91%     Exam:  Constitutional:   . Appears calm and comfortable ENMT:  . grossly normal hearing  Respiratory:  . CTA bilaterally, no w/r/r.  . Respiratory effort normal.  Cardiovascular:  . RRR, no m/r/g . No LE extremity edema   Psychiatric:  . Mental status o Mood, affect appropriate  I have personally reviewed the following:   Today's Data  . BMP unremarkable.  AST and ALT slightly improved.  Total bilirubin up to 6.5.  CBC unremarkable.  Scheduled Meds: . fentaNYL      . lidocaine (PF)      . midazolam      . cholecalciferol  1,000 Units Oral Daily  . DULoxetine  60 mg Oral BID  . loratadine  10 mg Oral Daily  . metoprolol succinate  25 mg Oral Daily  . montelukast  10 mg Oral QHS  . multivitamin with minerals  1 tablet Oral Daily  . QUEtiapine  150 mg Oral QHS  . Vitamin E  800 Units Oral Daily   And  . vitamin E  200 Units Oral Daily   Continuous Infusions: . sodium chloride 20 mL/hr at 09/08/20 1000  . lactated ringers 125 mL/hr at 09/08/20 1000    Principal Problem:   Jaundice Active Problems:   GERD (gastroesophageal reflux disease)   Elevated LFTs   LOS: 1 day   How to contact the  [facility] Attending or Consulting provider [place] or covering provider during after hours [place], for this patient?  1. Check the care team in [facility] and look for a) attending/consulting [facility] provider listed and b) the [facility] team listed 2. Log into [email or web address] and use [place]'s universal password to access. If you do not have the password, please contact the hospital operator. 3. Locate the [facility] provider you are looking for under [facility] and [place] to a number that you can be directly reached. 4. If you still have difficulty reaching the provider, please Jal[facility]na the [facility] (Director on Call) for the Hospitalists listed on amion for assistance.

## 2020-09-08 NOTE — Consult Note (Signed)
Chief Complaint: Obstructive jaundice. Request is for internal/external biliary drain placement with possible biopsy  Referring Physician(s): Dr. Rande Brunt  Supervising Physician: Sandi Mariscal  Patient Status: [facility] - In-pt  History of Present Illness: Brittany [name] is a 55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 Presented to the ED at [facility] on 5.27.22 with intermittent  RUQX1 month with elevated LFT's and  jaundice. Patient previously being managed for gallstones by PCP.Seen on [place] from 5.19.22. MRCP from 5.27.22 reads Moderate intra and extrahepatic biliary ductal dilatation with what appears to be a stricture of the mid common bile duct. Notably, there are some areas of mildly increased enhancement adjacent to the mid to distal common bile duct in the head of the pancreas. Although no discrete pancreatic mass is confidently identified, the possibility of a very small ductal or periductal lesion is not excluded. Further evaluation with endoscopic ultrasound is recommended in the near future to exclude underlying neoplasm. Due to patient's previous Roux-en-Y she is not a candidate for a ERCP. Team is requesting internal/external biliary drain placement.   Currently without any significant complaints. Patient alert and laying in bed, calm and comfortable. She denies and abdominal [pain at this time.  Denies any fevers, headache, chest pain, SOB, cough, , nausea, vomiting or bleeding. Return precautions and treatment recommendations and follow-up discussed with the patient who is agreeable with the plan.   Past Medical History:  Diagnosis Date  . Allergy   . Angio-edema   . Anxiety   . Arthritis    hips, knees and hands   . Blood transfusion without reported diagnosis   . Depression   . GERD (gastroesophageal reflux disease)    hx of   . H/O hiatal hernia   . History of chicken pox   . Hypercholesterolemia   . Hypertension   . Migraine   . Migraines   . Morbid obesity ([place])   . Positive TB  test   . PTSD (post-traumatic stress disorder)   . Sleep apnea    no CPAP machine   . Ulcer, stomach peptic   . Urticaria     Past Surgical History:  Procedure Laterality Date  . ABDOMINAL HYSTERECTOMY  1997 & 2006  . BREATH TEK H PYLORI  05/03/2012   Procedure: BREATH TEK H PYLORI;  Surgeon: [name], MD;  Location: [name];  Service: General;  Laterality: N/A;  . COLONOSCOPY     hx of benign polyps   . ESOPHAGOGASTRODUODENOSCOPY N/A 09/29/2017   Procedure: ESOPHAGOGASTRODUODENOSCOPY (EGD);  Surgeon: Ladene Artist, MD;  Location: [name];  Service: Endoscopy;  Laterality: N/A;  . FOOT SURGERY    . GASTRIC BYPASS    . GASTRIC ROUX-EN-Y N/A 07/05/2012   Procedure: LAPAROSCOPIC ROUX-EN-Y GASTRIC;  Surgeon: [name], MD;  Location: [facility];  Service: General;  Laterality: N/A;  . IRRIGATION AND DEBRIDEMENT ABSCESS N/A 01/01/2014   Procedure: IRRIGATION AND DEBRIDEMENT ABSCESS;  Surgeon: [name], MD;  Location: [facility];  Service: General;  Laterality: N/A;  . LAPAROSCOPIC LYSIS OF ADHESIONS N/A 07/05/2012   Procedure: LAPAROSCOPIC LYSIS OF ADHESIONS;  Surgeon: [name], MD;  Location: [facility];  Service: General;  Laterality: N/A;  repair of abdominal wall hernia  . right tube and ovary removed     . UPPER GI ENDOSCOPY N/A 07/05/2012   Procedure: UPPER GI ENDOSCOPY;  Surgeon: [name], MD;  Location: [facility];  Service: General;  Laterality: N/A;    Allergies:  Contrast media [iodinated diagnostic agents], Amoxicillin, Bactrim [sulfamethoxazole-trimethoprim], and Lamictal [lamotrigine]  Medications: Prior to Admission medications   Medication Sig Start Date End Date Taking? Authorizing Provider  calcium-vitamin D (OSCAL WITH D) 500-200 MG-UNIT tablet Take 3 tablets by mouth every morning. For bone health Patient taking differently: Take 1 tablet by mouth every evening. For bone health 09/17/15  Yes Lindell Spar I, NP  cetirizine (ZYRTEC) 10 MG tablet  Take 10 mg by mouth 2 (two) times daily.   Yes [provider]  cholecalciferol (VITAMIN D) 1000 units tablet Take 1 tablet (1,000 Units total) by mouth daily. For bone health 09/17/15  Yes Lindell Spar I, NP  DULoxetine (CYMBALTA) 60 MG capsule Take 1 capsule (60 mg total) by mouth 2 (two) times daily. 08/29/20  Yes Arfeen, Arlyce Harman, MD  EPINEPHrine (EPIPEN 2-PAK) 0.3 mg/0.3 mL IJ SOAJ injection Inject 0.3 mg into the muscle as needed for anaphylaxis. 07/09/20  Yes Garnet Sierras, DO  famotidine (PEPCID) 20 MG tablet Take 1 tablet (20 mg total) by mouth 2 (two) times daily as needed for heartburn or indigestion. Patient taking differently: Take 20 mg by mouth daily. 11/08/18  Yes Bobbitt, Sedalia Muta, MD  metoprolol succinate (TOPROL XL) 25 MG 24 hr tablet Take 1 tablet (25 mg total) by mouth daily. 08/31/20  Yes Tobb, Kardie, DO  montelukast (SINGULAIR) 10 MG tablet TAKE 1 TABLET(10 MG) BY MOUTH AT BEDTIME Patient taking differently: Take 10 mg by mouth at bedtime. 03/26/20  Yes Garnet Sierras, DO  Multiple Vitamin (MULTI-VITAMIN) tablet Take 1 tablet by mouth daily.   Yes [provider]  QUEtiapine (SEROQUEL) 50 MG tablet TAKE 3 TABLETS(150 MG) BY MOUTH AT BEDTIME Patient taking differently: Take 150 mg by mouth at bedtime. 08/29/20  Yes Arfeen, Arlyce Harman, MD  vitamin B-12 (CYANOCOBALAMIN) 500 MCG tablet Take 1 tablet (500 mcg total) by mouth daily. For Vitamin B-12 replacement Patient taking differently: Take 500 mcg by mouth daily. 09/17/15  Yes Lindell Spar I, NP  vitamin E 1000 UNIT capsule Take 1,000 Units by mouth daily.   Yes [provider]  XOLAIR 150 MG injection INJECT 300 MG UNDER THE SKIN EVERY 14 DAYS Patient taking differently: Inject 150 mg into the skin every 28 (twenty-eight) days. 06/21/20  Yes Garnet Sierras, DO     Family History  Problem Relation Age of Onset  . Colon cancer Mother 8  . Depression Father   . Diabetes Father   . Heart disease Father   . Bladder  Cancer Father   . Depression Sister   . Post-traumatic stress disorder Sister   . Alcohol abuse Paternal Uncle   . Bipolar disorder Cousin   . Anxiety disorder Sister   . Eczema Sister   . Cancer Maternal Aunt        breast  . Breast cancer Maternal Aunt   . Melanoma Maternal Grandmother   . Colon cancer Maternal Grandmother        does not know age of onset  . Healthy Maternal Grandfather   . Healthy Paternal Grandmother   . Heart disease Paternal Grandfather   . Stroke Paternal Grandfather   . Diabetes Paternal Grandfather     Social History   Socioeconomic History  . Marital status: Divorced    Spouse name: Not on file  . Number of children: 0  . Years of education: 1  . Highest education level: Not on file  Occupational History  . Occupation:  Software engineer   Tobacco Use  . Smoking status: Former Smoker    Packs/day: 0.10    Years: 1.00    Pack years: 0.10    Types: Cigarettes    Quit date: 04/27/2004    Years since quitting: 16.3  . Smokeless tobacco: Never Used  Vaping Use  . Vaping Use: Never used  Substance and Sexual Activity  . Alcohol use: Yes    Alcohol/week: 0.0 standard drinks    Comment: Occasional use/ 2 drinks a month  . Drug use: No  . Sexual activity: Yes    Birth control/protection: Surgical    Comment: 1st intercourse 55 yo-More than 5 partners  Other Topics Concern  . Not on file  Social History Narrative   Fun: Color and walk, house renovations   Denies abuse and feels safe at home.    Social Determinants of Health   Financial Resource Strain: Not on file  Food Insecurity: Not on file  Transportation Needs: Not on file  Physical Activity: Not on file  Stress: Not on file  Social Connections: Not on file     Review of Systems: A 12 point ROS discussed and pertinent positives are indicated in the HPI above.  All other systems are negative.  Review of Systems  Constitutional: Negative for fatigue and fever.   HENT: Negative for congestion.   Respiratory: Negative for cough and shortness of breath.   Gastrointestinal: Negative for abdominal pain, diarrhea, nausea and vomiting.    Vital Signs: BP (!) 136/93 (BP Location: Left Arm)   Pulse (!) 54   Temp (!) 97.5 F (36.4 C) (Oral)   Resp 16   Ht 5\' 9"  (1.753 m)   Wt 260 lb 3.2 oz (118 kg)   SpO2 98%   BMI 38.42 kg/m   Physical Exam Vitals and nursing note reviewed.  Constitutional:      Appearance: She is well-developed.  HENT:     Head: Normocephalic and atraumatic.  Eyes:     Conjunctiva/sclera: Conjunctivae normal.  Cardiovascular:     Rate and Rhythm: Normal rate and regular rhythm.  Pulmonary:     Effort: Pulmonary effort is normal.     Breath sounds: Normal breath sounds.  Musculoskeletal:     Cervical back: Normal range of motion.  Neurological:     Mental Status: She is alert and oriented to person, place, and time.     Imaging: US Abdomen Complete  Result Date: 08/30/2020 CLINICAL DATA:  Elevated LFT EXAM: ABDOMEN ULTRASOUND COMPLETE COMPARISON:  CT 09/05/2016 FINDINGS: Gallbladder: Multiple shadowing stones measuring up to 9 mm. Normal wall thickness. Negative sonographic Murphy. Common bile duct: Diameter: Up to 7.4 mm Liver: Diffusely echogenic. No focal hepatic abnormality. Portal vein is patent on color Doppler imaging with normal direction of blood flow towards the liver. IVC: No abnormality visualized. Pancreas: The duct is slightly dilated. This is a chronic finding as compared with the prior CT. Spleen: Size and appearance within normal limits. Right Kidney: Length: 12.1 cm. Echogenicity within normal limits. No mass or hydronephrosis visualized. Left Kidney: Length: 12.1 cm. Echogenicity within normal limits. No mass or hydronephrosis visualized. Abdominal aorta: No aneurysm visualized. Other findings: None. IMPRESSION: 1. Cholelithiasis without sonographic evidence for acute cholecystitis. 2. Diffusely echogenic  liver consistent with hepatic steatosis and or hepatocellular disease 3. Slightly dilated common bile duct up to 7 mm. If ductal obstruction is suspected, correlation with MRCP could be obtained. Slightly dilated pancreatic duct stable as  compared to CT from 2018. Electronically Signed   By: Donavan Foil M.D.   On: 08/30/2020 20:46   MR 3D Recon At Scanner  Result Date: 09/07/2020 CLINICAL DATA:  55 year old female with history of right upper quadrant abdominal pain. Common bile duct dilatation noted on ultrasound examination. Follow-up study. EXAM: MRI ABDOMEN WITHOUT AND WITH CONTRAST (INCLUDING MRCP) TECHNIQUE: Multiplanar multisequence MR imaging of the abdomen was performed both before and after the administration of intravenous contrast. Heavily T2-weighted images of the biliary and pancreatic ducts were obtained, and three-dimensional MRCP images were rendered by post processing. CONTRAST:  51mL GADAVIST GADOBUTROL 1 MMOL/ML IV SOLN COMPARISON:  No prior abdominal MRI. Abdominal ultrasound 09/07/2020. FINDINGS: Lower chest: Unremarkable. Hepatobiliary: In segment 2 of the liver (axial image 10 of series 15) there is a 2.3 x 2.0 cm T1 hypointense, T2 hyperintense lesion which demonstrates early peripheral nodular hyperenhancement with centripetal filling on delayed images, diagnostic of a cavernous hemangioma. No other suspicious cystic or solid hepatic lesions. Moderate intrahepatic biliary ductal dilatation noted on MRCP images. Common bile duct is dilated measuring 14 mm in the porta hepatis. Abrupt cut off of the common bile duct (coronal MRCP image 35 of series 8), favored to reflect a stricture. No definite filling defect within the common bile duct to suggest choledocholithiasis. There are numerous filling defects within the gallbladder, compatible with tiny gallstones. Gallbladder is moderately distended. Gallbladder wall thickness is normal. No pericholecystic fluid or surrounding inflammatory  changes. Pancreas: Subtle increased enhancement is noted in the head of the pancreas immediately inferior to the apparent stricture in the common bile duct, best appreciated on axial images 40 7-49 of series 25. No clearly definable mass is evident in this region, however, ductal lesion or periductal lesion is difficult to entirely exclude. No pancreatic ductal dilatation noted on MRCP images. No peripancreatic fluid collections or inflammatory changes. Spleen: Lobular contour of the lateral aspect of the spleen, potentially sequela of prior trauma or prior infarctions. Adrenals/Urinary Tract: Bilateral kidneys and adrenal glands are normal in appearance. No hydroureteronephrosis in the visualized portions of the abdomen. Stomach/Bowel: Ventral hernia containing a portion of the transverse colon, incompletely imaged. Visualized portions are otherwise unremarkable. Vascular/Lymphatic: No aneurysm identified in the visualized abdominal vasculature. No lymphadenopathy noted in the abdomen. Other: No significant volume of ascites noted in the visualized portions of the peritoneal cavity. Musculoskeletal: No aggressive appearing osseous lesions are noted in the visualized portions of the skeleton. IMPRESSION: 1. Moderate intra and extrahepatic biliary ductal dilatation with what appears to be a stricture of the mid common bile duct. Notably, there are some areas of mildly increased enhancement adjacent to the mid to distal common bile duct in the head of the pancreas. Although no discrete pancreatic mass is confidently identified, the possibility of a very small ductal or periductal lesion is not excluded. Further evaluation with endoscopic ultrasound is recommended in the near future to exclude underlying neoplasm. 2. Cholelithiasis without evidence of acute cholecystitis. No choledocholithiasis is noted at this time. 3. 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 4. Ventral hernia  containing a portion of the transverse colon incompletely imaged. Electronically Signed   By: Vinnie Langton M.D.   On: 09/07/2020 19:10   US Abdomen Limited  Result Date: 09/07/2020 CLINICAL DATA:  Acute right upper quadrant abdominal pain. EXAM: ULTRASOUND ABDOMEN LIMITED RIGHT UPPER QUADRANT COMPARISON:  Aug 30, 2020.  Sep 05, 2016. FINDINGS: Gallbladder: Cholelithiasis is noted without significant  gallbladder wall thickening or pericholecystic fluid. No sonographic Murphy's sign is noted. Common bile duct: Diameter: 8 mm which is mildly dilated. Liver: 1.8 cm cyst is seen in left hepatic lobe. Mildly increased echogenicity of hepatic parenchyma is noted suggesting hepatic steatosis. Portal vein is patent on color Doppler imaging with normal direction of blood flow towards the liver. Other: None. IMPRESSION: Cholelithiasis is noted without evidence of cholecystitis. Common bile duct is mildly dilated at 8 mm. Correlation with liver function tests is recommended to rule out distal common bile duct obstruction. MRCP may be performed for further evaluation. Possible hepatic steatosis.  Left hepatic cyst. Electronically Signed   By: Marijo Conception M.D.   On: 09/07/2020 12:27   MR ABDOMEN MRCP W WO CONTAST  Result Date: 09/07/2020 CLINICAL DATA:  55 year old female with history of right upper quadrant abdominal pain. Common bile duct dilatation noted on ultrasound examination. Follow-up study. EXAM: MRI ABDOMEN WITHOUT AND WITH CONTRAST (INCLUDING MRCP) TECHNIQUE: Multiplanar multisequence MR imaging of the abdomen was performed both before and after the administration of intravenous contrast. Heavily T2-weighted images of the biliary and pancreatic ducts were obtained, and three-dimensional MRCP images were rendered by post processing. CONTRAST:  65mL GADAVIST GADOBUTROL 1 MMOL/ML IV SOLN COMPARISON:  No prior abdominal MRI. Abdominal ultrasound 09/07/2020. FINDINGS: Lower chest: Unremarkable.  Hepatobiliary: In segment 2 of the liver (axial image 10 of series 15) there is a 2.3 x 2.0 cm T1 hypointense, T2 hyperintense lesion which demonstrates early peripheral nodular hyperenhancement with centripetal filling on delayed images, diagnostic of a cavernous hemangioma. No other suspicious cystic or solid hepatic lesions. Moderate intrahepatic biliary ductal dilatation noted on MRCP images. Common bile duct is dilated measuring 14 mm in the porta hepatis. Abrupt cut off of the common bile duct (coronal MRCP image 35 of series 8), favored to reflect a stricture. No definite filling defect within the common bile duct to suggest choledocholithiasis. There are numerous filling defects within the gallbladder, compatible with tiny gallstones. Gallbladder is moderately distended. Gallbladder wall thickness is normal. No pericholecystic fluid or surrounding inflammatory changes. Pancreas: Subtle increased enhancement is noted in the head of the pancreas immediately inferior to the apparent stricture in the common bile duct, best appreciated on axial images 40 7-49 of series 25. No clearly definable mass is evident in this region, however, ductal lesion or periductal lesion is difficult to entirely exclude. No pancreatic ductal dilatation noted on MRCP images. No peripancreatic fluid collections or inflammatory changes. Spleen: Lobular contour of the lateral aspect of the spleen, potentially sequela of prior trauma or prior infarctions. Adrenals/Urinary Tract: Bilateral kidneys and adrenal glands are normal in appearance. No hydroureteronephrosis in the visualized portions of the abdomen. Stomach/Bowel: Ventral hernia containing a portion of the transverse colon, incompletely imaged. Visualized portions are otherwise unremarkable. Vascular/Lymphatic: No aneurysm identified in the visualized abdominal vasculature. No lymphadenopathy noted in the abdomen. Other: No significant volume of ascites noted in the visualized  portions of the peritoneal cavity. Musculoskeletal: No aggressive appearing osseous lesions are noted in the visualized portions of the skeleton. IMPRESSION: 1. Moderate intra and extrahepatic biliary ductal dilatation with what appears to be a stricture of the mid common bile duct. Notably, there are some areas of mildly increased enhancement adjacent to the mid to distal common bile duct in the head of the pancreas. Although no discrete pancreatic mass is confidently identified, the possibility of a very small ductal or periductal lesion is not excluded. Further evaluation with endoscopic  ultrasound is recommended in the near future to exclude underlying neoplasm. 2. Cholelithiasis without evidence of acute cholecystitis. No choledocholithiasis is noted at this time. 3. 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 4. Ventral hernia containing a portion of the transverse colon incompletely imaged. Electronically Signed   By: Vinnie Langton M.D.   On: 09/07/2020 19:10    Labs:  CBC: Recent Labs    08/09/20 0910 09/07/20 1155 09/08/20 0349  WBC 5.6 5.6 4.4  HGB 13.9 12.9 11.4*  HCT 40.8 37.2 33.0*  PLT 365.0 270 244    COAGS: Recent Labs    09/07/20 1845  INR 0.9    BMP: Recent Labs    08/09/20 0910 09/07/20 1155 09/08/20 0349  NA 140 139 139  K 4.3 3.1* 3.5  CL 105 107 105  CO2 25 25 25   GLUCOSE 100* 122* 107*  BUN 15 6 6   CALCIUM 9.5 9.0 8.6*  CREATININE 0.64 0.60 0.38*  GFRNONAA  --  >60 >60    LIVER FUNCTION TESTS: Recent Labs    08/09/20 0910 09/07/20 1155 09/08/20 0349  BILITOT 0.5 3.5* 6.5*  AST 21 269* 262*  ALT 65* 464* 403*  ALKPHOS 203* 708* 692*  PROT 7.0 6.6 6.0*  ALBUMIN 4.1 3.2* 2.8*     Assessment and Plan:  55 y.o. female inpatient. History of roux-en-Y. Presented to the ED at [facility] on 5.27.22 with intermittent  RUQX1 month with elevated LFT's and  jaundice. Patient previously being managed for gallstones by  PCP.Seen on [place] from 5.19.22. MRCP from 5.27.22 reads Moderate intra and extrahepatic biliary ductal dilatation with what appears to be a stricture of the mid common bile duct. Notably, there are some areas of mildly increased enhancement adjacent to the mid to distal common bile duct in the head of the pancreas. Although no discrete pancreatic mass is confidently identified, the possibility of a very small ductal or periductal lesion is not excluded. Further evaluation with endoscopic ultrasound is recommended in the near future to exclude underlying neoplasm. Due to patient's previous Roux-en-Y she is not a candidate for a ERCP. Team is requesting internal/external biliary drain placement.   Total bilirubin 6.5, AST 262, ALT 403, Alkaline phosphatase 692. Al;l other labs and medications are within acceptable parameters. Allergeis include amoxicillin, bactrim, and contrast dye ( reaction hives). Patient states she last ate on Thursday.  IR consulted for possible internal- external biliary drain placement. Case has been reviewed and procedure approved by Dr. Pascal Lux.  Patient tentatively scheduled for 5.28.22.  Team instructed to: Keep Patient to be NPO after midnight Hold prophylactic anticoagulation 24 hours prior to scheduled procedure. Patient to be given premedication for IVP dye allergy. Per current guidelines 40 mg of solumedrol 4 hours prior to procedure and 50 mg of benadryl to be given 1 hour prior to procedure  IR will call patient when ready.  Risks and benefits of internal external biliary drain placement discussed with the patient including, but not limited to bleeding, infection which may lead to sepsis or even death and damage to adjacent structures.  This interventional procedure involves the use of X-rays and because of the nature of the planned procedure, it is possible that we will have prolonged use of X-ray fluoroscopy.  Potential radiation risks to you include (but are not  limited to) the following: - A slightly elevated risk for cancer  several years later in life. This risk is typically less than 0.5% percent.  This risk is low in comparison to the normal incidence of human cancer, which is 33% for women and 50% for men according to the [employer]. - Radiation induced injury can include skin redness, resembling a rash, tissue breakdown / ulcers and hair loss (which can be temporary or permanent).   The likelihood of either of these occurring depends on the difficulty of the procedure and whether you are sensitive to radiation due to previous procedures, disease, or genetic conditions.   IF your procedure requires a prolonged use of radiation, you will be notified and given written instructions for further action.  It is your responsibility to monitor the irradiated area for the 2 weeks following the procedure and to notify your physician if you are concerned that you have suffered a radiation induced injury.    All of the patient's questions were answered, patient is agreeable to proceed.  Consent signed and in chart.   Thank you for this interesting consult.  I greatly enjoyed meeting Noora Janan Halter and look forward to participating in their care.  A copy of this report was sent to the requesting provider on this date.  Electronically Signed: Jacqualine Mau, NP 09/08/2020, 9:47 AM   I spent a total of 40 Minutes    in face to face in clinical consultation, greater than 50% of which was counseling/coordinating care for internal/external biliary drain placement

## 2020-09-08 NOTE — Procedures (Signed)
Pre procedural Dx: Obstructive Juandice Post procedural Dx: Same  Successful placement of a right hepatic approach transhepatic 10 Fr biliary drainage catheter with end coiled and locked within the duodenum.   Biliary drain connected to gravity bag.  EBL: Minimal Complications: None immediate  Jay Jenean Escandon, MD Pager #: [phone]     

## 2020-09-08 NOTE — Hospital Course (Addendum)
55 year old woman PMH of Roux-en-Y 2015 presented with jaundice. Right upper quadrant and epigastric pain.  Found to have elevated LFTs and dilated common bile duct.  MRCP negative for CBD stone but concerning for pancreatic mass.  ERCP not possible secondary to anatomy per surgery.  Status post drain placement 5/28 by IR.  A & P  Obstructive jaundice, elevated LFTs, concern for pancreatic mass.  Hepatitis panel negative. -- Transaminases continue to trend down, as does total bilirubin and alkaline phosphatase.  Drain output significantly decreased. --Await further GI recommendations. --We will have outpatient follow-up with Dr. Ardis Hughs for EUS and interventional radiology for definitive cholangiogram and potential biliary brush biopsy.  Anxiety, depression -- Stable.  Continue quetiapine.

## 2020-09-09 DIAGNOSIS — R17 Unspecified jaundice: Secondary | ICD-10-CM | POA: Diagnosis not present

## 2020-09-09 DIAGNOSIS — R7989 Other specified abnormal findings of blood chemistry: Secondary | ICD-10-CM | POA: Diagnosis not present

## 2020-09-09 LAB — COMPREHENSIVE METABOLIC PANEL
ALT: 390 U/L — ABNORMAL HIGH (ref 0–44)
AST: 144 U/L — ABNORMAL HIGH (ref 15–41)
Albumin: 3.1 g/dL — ABNORMAL LOW (ref 3.5–5.0)
Alkaline Phosphatase: 692 U/L — ABNORMAL HIGH (ref 38–126)
Anion gap: 8 (ref 5–15)
BUN: 6 mg/dL (ref 6–20)
CO2: 25 mmol/L (ref 22–32)
Calcium: 9 mg/dL (ref 8.9–10.3)
Chloride: 105 mmol/L (ref 98–111)
Creatinine, Ser: 0.37 mg/dL — ABNORMAL LOW (ref 0.44–1.00)
GFR, Estimated: >60 mL/min (ref >60)
Glucose, Bld: 145 mg/dL — ABNORMAL HIGH (ref 70–99)
Potassium: 3.8 mmol/L (ref 3.5–5.1)
Sodium: 138 mmol/L (ref 135–145)
Total Bilirubin: 2.9 mg/dL — ABNORMAL HIGH (ref 0.3–1.2)
Total Protein: 6.3 g/dL — ABNORMAL LOW (ref 6.5–8.1)

## 2020-09-09 MED ORDER — DIPHENHYDRAMINE HCL 25 MG PO CAPS
25.0000 mg | ORAL_CAPSULE | Freq: Four times a day (QID) | ORAL | Status: DC | PRN
  Administered 2020-09-09 – 2020-09-10 (×2): 25 mg via ORAL
  Filled 2020-09-09 (×2): qty 1

## 2020-09-09 MED ORDER — HYDROCORTISONE 1 % EX CREA
TOPICAL_CREAM | CUTANEOUS | Status: DC | PRN
  Filled 2020-09-09 (×2): qty 28

## 2020-09-09 MED ORDER — ENSURE ENLIVE PO LIQD
237.0000 mL | Freq: Two times a day (BID) | ORAL | Status: DC
  Administered 2020-09-09 – 2020-09-10 (×2): 237 mL via ORAL

## 2020-09-09 NOTE — Progress Notes (Addendum)
Referring Physician(s): Dr. Rande Brunt  Supervising Physician: Ruthann Cancer  Patient Status:  [facility] - In-pt  Chief Complaint:  Obstructive jaundice s/p right sided biliary drain placement on 5.29.22  Subjective:  Patient report pain at the exit site of the biliary drain that improved with pain medication. She states that she is felling better and would like to know her latest labs and next steps.  Allergies: Contrast media [iodinated diagnostic agents], Amoxicillin, Bactrim [sulfamethoxazole-trimethoprim], and Lamictal [lamotrigine]  Medications: Prior to Admission medications   Medication Sig Start Date End Date Taking? Authorizing Provider  calcium-vitamin D (OSCAL WITH D) 500-200 MG-UNIT tablet Take 3 tablets by mouth every morning. For bone health Patient taking differently: Take 1 tablet by mouth every evening. For bone health 09/17/15  Yes Lindell Spar I, NP  cetirizine (ZYRTEC) 10 MG tablet Take 10 mg by mouth 2 (two) times daily.   Yes [provider]  cholecalciferol (VITAMIN D) 1000 units tablet Take 1 tablet (1,000 Units total) by mouth daily. For bone health 09/17/15  Yes Lindell Spar I, NP  DULoxetine (CYMBALTA) 60 MG capsule Take 1 capsule (60 mg total) by mouth 2 (two) times daily. 08/29/20  Yes Arfeen, Arlyce Harman, MD  EPINEPHrine (EPIPEN 2-PAK) 0.3 mg/0.3 mL IJ SOAJ injection Inject 0.3 mg into the muscle as needed for anaphylaxis. 07/09/20  Yes Garnet Sierras, DO  famotidine (PEPCID) 20 MG tablet Take 1 tablet (20 mg total) by mouth 2 (two) times daily as needed for heartburn or indigestion. Patient taking differently: Take 20 mg by mouth daily. 11/08/18  Yes Bobbitt, Sedalia Muta, MD  metoprolol succinate (TOPROL XL) 25 MG 24 hr tablet Take 1 tablet (25 mg total) by mouth daily. 08/31/20  Yes Tobb, Kardie, DO  montelukast (SINGULAIR) 10 MG tablet TAKE 1 TABLET(10 MG) BY MOUTH AT BEDTIME Patient taking differently: Take 10 mg by mouth at bedtime. 03/26/20  Yes Garnet Sierras, DO  Multiple Vitamin (MULTI-VITAMIN) tablet Take 1 tablet by mouth daily.   Yes [provider]  QUEtiapine (SEROQUEL) 50 MG tablet TAKE 3 TABLETS(150 MG) BY MOUTH AT BEDTIME Patient taking differently: Take 150 mg by mouth at bedtime. 08/29/20  Yes Arfeen, Arlyce Harman, MD  vitamin B-12 (CYANOCOBALAMIN) 500 MCG tablet Take 1 tablet (500 mcg total) by mouth daily. For Vitamin B-12 replacement Patient taking differently: Take 500 mcg by mouth daily. 09/17/15  Yes Lindell Spar I, NP  vitamin E 1000 UNIT capsule Take 1,000 Units by mouth daily.   Yes [provider]  XOLAIR 150 MG injection INJECT 300 MG UNDER THE SKIN EVERY 14 DAYS Patient taking differently: Inject 150 mg into the skin every 28 (twenty-eight) days. 06/21/20  Yes Garnet Sierras, DO     Vital Signs: BP 128/81 (BP Location: Left Arm)   Pulse 62   Temp 97.7 F (36.5 C) (Oral)   Resp 18   Ht 5\' 9"  (1.753 m)   Wt 260 lb 3.2 oz (118 kg)   SpO2 95%   BMI 38.42 kg/m   Physical Exam Vitals and nursing note reviewed.  Constitutional:      Appearance: She is well-developed.  HENT:     Head: Normocephalic and atraumatic.  Eyes:     Conjunctiva/sclera: Conjunctivae normal.  Pulmonary:     Effort: Pulmonary effort is normal.  Abdominal:     Comments: Positive RUQ drain to gravity bag. Site is unremarkable with no erythema, edema, tenderness, bleeding or drainage noted at  exit site. Dressing is clean dry and intact. 100 ml of  Dark brown colored fluid noted in gravity bag. Drain is able to be flushed easily.   Musculoskeletal:     Cervical back: Normal range of motion.  Neurological:     Mental Status: She is alert and oriented to person, place, and time.     Imaging: MR 3D Recon At Scanner  Result Date: 09/07/2020 CLINICAL DATA:  55 year old female with history of right upper quadrant abdominal pain. Common bile duct dilatation noted on ultrasound examination. Follow-up study. EXAM: MRI ABDOMEN WITHOUT AND WITH  CONTRAST (INCLUDING MRCP) TECHNIQUE: Multiplanar multisequence MR imaging of the abdomen was performed both before and after the administration of intravenous contrast. Heavily T2-weighted images of the biliary and pancreatic ducts were obtained, and three-dimensional MRCP images were rendered by post processing. CONTRAST:  39mL GADAVIST GADOBUTROL 1 MMOL/ML IV SOLN COMPARISON:  No prior abdominal MRI. Abdominal ultrasound 09/07/2020. FINDINGS: Lower chest: Unremarkable. Hepatobiliary: In segment 2 of the liver (axial image 10 of series 15) there is a 2.3 x 2.0 cm T1 hypointense, T2 hyperintense lesion which demonstrates early peripheral nodular hyperenhancement with centripetal filling on delayed images, diagnostic of a cavernous hemangioma. No other suspicious cystic or solid hepatic lesions. Moderate intrahepatic biliary ductal dilatation noted on MRCP images. Common bile duct is dilated measuring 14 mm in the porta hepatis. Abrupt cut off of the common bile duct (coronal MRCP image 35 of series 8), favored to reflect a stricture. No definite filling defect within the common bile duct to suggest choledocholithiasis. There are numerous filling defects within the gallbladder, compatible with tiny gallstones. Gallbladder is moderately distended. Gallbladder wall thickness is normal. No pericholecystic fluid or surrounding inflammatory changes. Pancreas: Subtle increased enhancement is noted in the head of the pancreas immediately inferior to the apparent stricture in the common bile duct, best appreciated on axial images 40 7-49 of series 25. No clearly definable mass is evident in this region, however, ductal lesion or periductal lesion is difficult to entirely exclude. No pancreatic ductal dilatation noted on MRCP images. No peripancreatic fluid collections or inflammatory changes. Spleen: Lobular contour of the lateral aspect of the spleen, potentially sequela of prior trauma or prior infarctions.  Adrenals/Urinary Tract: Bilateral kidneys and adrenal glands are normal in appearance. No hydroureteronephrosis in the visualized portions of the abdomen. Stomach/Bowel: Ventral hernia containing a portion of the transverse colon, incompletely imaged. Visualized portions are otherwise unremarkable. Vascular/Lymphatic: No aneurysm identified in the visualized abdominal vasculature. No lymphadenopathy noted in the abdomen. Other: No significant volume of ascites noted in the visualized portions of the peritoneal cavity. Musculoskeletal: No aggressive appearing osseous lesions are noted in the visualized portions of the skeleton. IMPRESSION: 1. Moderate intra and extrahepatic biliary ductal dilatation with what appears to be a stricture of the mid common bile duct. Notably, there are some areas of mildly increased enhancement adjacent to the mid to distal common bile duct in the head of the pancreas. Although no discrete pancreatic mass is confidently identified, the possibility of a very small ductal or periductal lesion is not excluded. Further evaluation with endoscopic ultrasound is recommended in the near future to exclude underlying neoplasm. 2. Cholelithiasis without evidence of acute cholecystitis. No choledocholithiasis is noted at this time. 3. 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 4. Ventral hernia containing a portion of the transverse colon incompletely imaged. Electronically Signed   By: Vinnie Langton M.D.   On: 09/07/2020  19:10   US Abdomen Limited  Result Date: 09/07/2020 CLINICAL DATA:  Acute right upper quadrant abdominal pain. EXAM: ULTRASOUND ABDOMEN LIMITED RIGHT UPPER QUADRANT COMPARISON:  Aug 30, 2020.  Sep 05, 2016. FINDINGS: Gallbladder: Cholelithiasis is noted without significant gallbladder wall thickening or pericholecystic fluid. No sonographic Murphy's sign is noted. Common bile duct: Diameter: 8 mm which is mildly dilated. Liver: 1.8 cm cyst  is seen in left hepatic lobe. Mildly increased echogenicity of hepatic parenchyma is noted suggesting hepatic steatosis. Portal vein is patent on color Doppler imaging with normal direction of blood flow towards the liver. Other: None. IMPRESSION: Cholelithiasis is noted without evidence of cholecystitis. Common bile duct is mildly dilated at 8 mm. Correlation with liver function tests is recommended to rule out distal common bile duct obstruction. MRCP may be performed for further evaluation. Possible hepatic steatosis.  Left hepatic cyst. Electronically Signed   By: Marijo Conception M.D.   On: 09/07/2020 12:27   MR ABDOMEN MRCP W WO CONTAST  Result Date: 09/07/2020 CLINICAL DATA:  55 year old female with history of right upper quadrant abdominal pain. Common bile duct dilatation noted on ultrasound examination. Follow-up study. EXAM: MRI ABDOMEN WITHOUT AND WITH CONTRAST (INCLUDING MRCP) TECHNIQUE: Multiplanar multisequence MR imaging of the abdomen was performed both before and after the administration of intravenous contrast. Heavily T2-weighted images of the biliary and pancreatic ducts were obtained, and three-dimensional MRCP images were rendered by post processing. CONTRAST:  93mL GADAVIST GADOBUTROL 1 MMOL/ML IV SOLN COMPARISON:  No prior abdominal MRI. Abdominal ultrasound 09/07/2020. FINDINGS: Lower chest: Unremarkable. Hepatobiliary: In segment 2 of the liver (axial image 10 of series 15) there is a 2.3 x 2.0 cm T1 hypointense, T2 hyperintense lesion which demonstrates early peripheral nodular hyperenhancement with centripetal filling on delayed images, diagnostic of a cavernous hemangioma. No other suspicious cystic or solid hepatic lesions. Moderate intrahepatic biliary ductal dilatation noted on MRCP images. Common bile duct is dilated measuring 14 mm in the porta hepatis. Abrupt cut off of the common bile duct (coronal MRCP image 35 of series 8), favored to reflect a stricture. No definite filling  defect within the common bile duct to suggest choledocholithiasis. There are numerous filling defects within the gallbladder, compatible with tiny gallstones. Gallbladder is moderately distended. Gallbladder wall thickness is normal. No pericholecystic fluid or surrounding inflammatory changes. Pancreas: Subtle increased enhancement is noted in the head of the pancreas immediately inferior to the apparent stricture in the common bile duct, best appreciated on axial images 40 7-49 of series 25. No clearly definable mass is evident in this region, however, ductal lesion or periductal lesion is difficult to entirely exclude. No pancreatic ductal dilatation noted on MRCP images. No peripancreatic fluid collections or inflammatory changes. Spleen: Lobular contour of the lateral aspect of the spleen, potentially sequela of prior trauma or prior infarctions. Adrenals/Urinary Tract: Bilateral kidneys and adrenal glands are normal in appearance. No hydroureteronephrosis in the visualized portions of the abdomen. Stomach/Bowel: Ventral hernia containing a portion of the transverse colon, incompletely imaged. Visualized portions are otherwise unremarkable. Vascular/Lymphatic: No aneurysm identified in the visualized abdominal vasculature. No lymphadenopathy noted in the abdomen. Other: No significant volume of ascites noted in the visualized portions of the peritoneal cavity. Musculoskeletal: No aggressive appearing osseous lesions are noted in the visualized portions of the skeleton. IMPRESSION: 1. Moderate intra and extrahepatic biliary ductal dilatation with what appears to be a stricture of the mid common bile duct. Notably, there are some areas of mildly  increased enhancement adjacent to the mid to distal common bile duct in the head of the pancreas. Although no discrete pancreatic mass is confidently identified, the possibility of a very small ductal or periductal lesion is not excluded. Further evaluation with  endoscopic ultrasound is recommended in the near future to exclude underlying neoplasm. 2. Cholelithiasis without evidence of acute cholecystitis. No choledocholithiasis is noted at this time. 3. 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 4. Ventral hernia containing a portion of the transverse colon incompletely imaged. Electronically Signed   By: Vinnie Langton M.D.   On: 09/07/2020 19:10   IR BILIARY DRAIN PLACEMENT WITH CHOLANGIOGRAM  Result Date: 09/08/2020 INDICATION: Obstructive jaundice of uncertain etiology. Patient is not a candidate for ERCP given history of gastric bypass. As such, request made for placement of an image guided percutaneous biliary drainage catheter for infection source control purposes. EXAM: ULTRASOUND AND FLUOROSCOPIC GUIDED PERCUTANEOUS TRANSHEPATIC CHOLANGIOGRAM AND BILIARY TUBE PLACEMENT COMPARISON:  MRCP-09/07/2020; right upper quadrant abdominal ultrasound-09/07/2020 MEDICATIONS: Zosyn 3.375 g IV; The antibiotic was administered with an appropriate time frame prior to the initiation of the procedure CONTRAST:  40 cc Isovue-300 - administered into the biliary tree. ANESTHESIA/SEDATION: Moderate (conscious) sedation was employed during this procedure. A total of Versed 8 mg, Dilaudid 1 mg and Fentanyl 300 mcg was administered intravenously. Moderate Sedation Time: 60 minutes. The patient's level of consciousness and vital signs were monitored continuously by radiology nursing throughout the procedure under my direct supervision. FLUOROSCOPY TIME:  15 minutes, 42 seconds (749 mGy) COMPLICATIONS: None immediate. TECHNIQUE: Informed written consent was obtained from the patient after a discussion of the risks, benefits and alternatives to treatment. Questions regarding the procedure were encouraged and answered. A timeout was performed prior to the initiation of the procedure. The right upper abdominal quadrant was prepped and draped in the usual  sterile fashion, and a sterile drape was applied covering the operative field. Maximum barrier sterile technique with sterile gowns and gloves were used for the procedure. A timeout was performed prior to the initiation of the procedure. Ultrasound scanning of the right upper abdominal quadrant was performed to delineate the anatomy and avoid transgression of the gallbladder or the pleural. A spot along the right mid axillary line was marked fluoroscopically inferior to the right costophrenic angle. After the overlying soft tissues were anesthetized with 1% Lidocaine with epinephrine, under direct ultrasound guidance, a 22 gauge Chiba needle was utilized to attempt to cannulate a peripheral duct within the right intrahepatic biliary tree. Ultimately, this proved challenging given lack of significant dilatation of the peripheral aspect of the biliary tree as well as patient body habitus and associated poor sonographic window. Ultimately, the central aspect of the right intrahepatic biliary tree was successfully cannulated with the inner 3 French catheter from the [place] set allowing opacification of smaller peripheral ducts. Next, under direct fluoroscopic guidance, an additional 22 gauge Chiba needle was utilized to access a nondilated peripheral duct within the right intrahepatic biliary tree. Appropriate access was confirmed with advancement of a Nitrex wire centrally. The track was dilated with the remainder of the [place] set. Next, a 4 French angled glide catheter was advanced through the outer sheath of the Accustick set and with the use of a regular glidewire, advanced through the biliary hilum, common bile duct and ampulla to the level of the duodenum. Contrast injection confirmed appropriate positioning. Under intermittent fluoroscopic guidance and over an Amplatz wire, the track was dilated ultimately  allowing placement of a 10 [place] biliary drainage catheter with coil ultimately locked within the  duodenum. Contrast was injected and a completion radiographs were obtained in various obliquities. The catheter was connected to a drainage bag which yielded the brisk return of dark bile. The catheter was secured to the skin with an interrupted suture and StatLock device. Dressings were applied. The patient tolerated the procedure well without immediate postprocedural complication. FINDINGS: Sonographic evaluation of the liver demonstrates dilatation of the central aspect of the biliary tree with lack of significant dilatation peripherally. Initially, the central aspect of the right biliary tree was accessed allowing opacification of the smaller peripheral ducts. Next, utilizing a two stick technique, the peripheral aspect of the right biliary tree was successfully accessed allowing placement of a 10 [place] biliary drainage catheter with end ultimately coiled and locked within the duodenum and radiopaque side marker located at the level of the biliary hilum. Limited contrast injection demonstrates tapered narrowing/subtotal occlusion involving the mid/distal aspect of the CBD (series 5). IMPRESSION: Successful placement of a 10.2 French percutaneous biliary drainage catheter with end coiled and locked within the duodenum. PLAN: Recommend obtaining daily LFTs. Once patient's bilirubin has reached a nadir, the patient may return for definitive cholangiogram and potential fluoroscopic guided biliary brush biopsy as indicated. Electronically Signed   By: Sandi Mariscal M.D.   On: 09/08/2020 15:35    Labs:  CBC: Recent Labs    08/09/20 0910 09/07/20 1155 09/08/20 0349  WBC 5.6 5.6 4.4  HGB 13.9 12.9 11.4*  HCT 40.8 37.2 33.0*  PLT 365.0 270 244    COAGS: Recent Labs    09/07/20 1845  INR 0.9    BMP: Recent Labs    08/09/20 0910 09/07/20 1155 09/08/20 0349 09/09/20 0424  NA 140 139 139 138  K 4.3 3.1* 3.5 3.8  CL 105 107 105 105  CO2 25 25 25 25   GLUCOSE 100* 122* 107* 145*  BUN 15 6 6 6    CALCIUM 9.5 9.0 8.6* 9.0  CREATININE 0.64 0.60 0.38* 0.37*  GFRNONAA  --  >60 >60 >60    LIVER FUNCTION TESTS: Recent Labs    08/09/20 0910 09/07/20 1155 09/08/20 0349 09/09/20 0424  BILITOT 0.5 3.5* 6.5* 2.9*  AST 21 269* 262* 144*  ALT 65* 464* 403* 390*  ALKPHOS 203* 708* 692* 692*  PROT 7.0 6.6 6.0* 6.3*  ALBUMIN 4.1 3.2* 2.8* 3.1*    Assessment and Plan:  55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 Presented to the ED at [facility] on 5.27.22 with intermittent  RUQX1 month with elevated LFT's and  jaundice. Patient previously being managed for gallstones by PCP.Seen on [place] from 5.19.22. MRCP from 5.27.22 reads Moderate intra and extrahepatic biliary ductal dilatation with what appears to be a stricture of the mid common bile duct. Notably, there are some areas of mildly increased enhancement adjacent to the mid to distal common bile duct in the head of the pancreas. Although no discrete pancreatic mass is confidently identified, the possibility of a very small ductal or periductal lesion is not excluded. Further evaluation with endoscopic ultrasound is recommended in the near future to exclude underlying neoplasm. Due to patient's previous Roux-en-Y she is not a candidate for a ERCP. IR placed an internal/external biliary drain on 5.29.22. Total Bilirubin 2.9, AST 144, ALT 390. Per Epic output has been 1750 ml. 100 ml of dark brown fluid noted to be in  the gravity bag. Dressing is clean dry and intact. Drain  is able to be flushed easily.   Recommend team continue with flushing TID, output recording q shift and dressing changes as needed. Recommend obtaining daily LFTs. Once patient's bilirubin has reached a nadir, the patient may return for definitive cholangiogram  and potential fluoroscopic guided biliary brush biopsy as indicated.   Continue current treatment plans as per Primary Team and GI.   Electronically Signed: Jacqualine Mau, NP 09/09/2020, 9:06 AM   I spent a total of 15  Minutes at the patient's bedside AND on the patient's hospital floor or unit, greater than 50% of which was counseling/coordinating care for right sided biliary drain.

## 2020-09-09 NOTE — TOC Transition Note (Signed)
Transition of Care [facility]) - CM/SW Discharge Note   Patient Details  Name: CHANI GHANEM MRN: [id number] Date of Birth: [birth year]  [facility] CM/SW Contact:  Ova Freshwater Phone Number: [phone]  09/09/2020, 12:08 PM   Clinical Narrative:     Patient will d/c home with biliary drainage care supplies.  CSW spoke with patient at length about home health, process and estimated timeline.  CSW encouraged the patient to follow up with her PCP, for possible referral to outpatient biliary drain care, if a home health agency was not able to accept her.  Patient verbalized understanding.  CSW confirmed w/ unit RN who will speak with and teach the patient drain care and give her supplies.  CW also gave [employer] [phone], contact information to add to discharge summary. CSW contacted several home health agencies for RN services. Attending and Unit RN updated.  Final next level of care: [place]     Patient Goals and CMS Choice        Discharge Placement                       Discharge Plan and Services In-house Referral: Clinical Social Work   Post Acute Care Choice: [place]            DME Agency: [employer] Date DME Agency Contacted: 09/09/20 Time DME Agency Contacted: 34 Representative spoke with at [place]: [place] spoke with [employer] rep and he stated they ar unable to assist with biliary drainage supplies.            Social Determinants of Health (SDOH) Interventions     Readmission Risk Interventions No flowsheet data found.

## 2020-09-09 NOTE — Progress Notes (Signed)
Initial Nutrition Assessment  DOCUMENTATION CODES:   Not applicable  INTERVENTION:   Liberalize diet to REGULAR   Ensure Enlive po BID, each supplement provides 350 kcal and 20 grams of protein  MVI daily   NUTRITION DIAGNOSIS:   Inadequate oral intake related to poor appetite as evidenced by per patient/family report.  GOAL:   Patient will meet greater than or equal to 90% of their needs  MONITOR:   PO intake,Supplement acceptance,Weight trends,Labs,I & O's  REASON FOR ASSESSMENT:   Malnutrition Screening Tool    ASSESSMENT:   Patient with PMH significant for HLD, GERD, and anxiety. Presents this admission with biliary obstruction.  5/28- s/p biliary drainage catheter placed   Patient endorses a loss in appetite the last two of months. During this time she consumed mostly soups and grilled cheese sandwiches. Prior to this she consumed three meal daily that consisted of good protein sources. Appetite this admission progressing. She consumed 40% of breakfast this am. Discussed the importance of protein intake for preservation of lean body mass. Patient willing to try Ensure.    Patient reports a UBW of 274 lb and a recent weight loss of 15 lbs. Records indicate patient weighed 274 lb on 04/02/20 and 260 kg this admission (5.12% wt loss in five months, insignificant for time frame).   Drips: NS @ 20 ml/hr, LR @ 125 ml/hr  Medications: vitamin E Labs: LFTs trending down  Diet Order:   Diet Order            Diet Heart Room service appropriate? Yes; Fluid consistency: Thin  Diet effective now                 EDUCATION NEEDS:   Education needs have been addressed  Skin:  Skin Assessment: Reviewed RN Assessment  Last BM:  5/27  Height:   Ht Readings from Last 1 Encounters:  09/07/20 5\' 9"  (1.753 m)    Weight:   Wt Readings from Last 1 Encounters:  09/07/20 118 kg    BMI:  Body mass index is 38.42 kg/m.  Estimated Nutritional Needs:   Kcal:   1800-2000 kcal  Protein:  90-105 grams  Fluid:  >/= 1.8 L/day  Mariana Single RD, LDN Clinical Nutrition Pager listed in [place]

## 2020-09-09 NOTE — Progress Notes (Signed)
PROGRESS NOTE  [name] CWC:[id number] DOB: 07/21/1965 DOA: 09/07/2020 PCP: [name], FNP  Brief History   55 year old woman PMH of Roux-en-Y 2015 presented with jaundice. Right upper quadrant and epigastric pain.  Found to have elevated LFTs and dilated common bile duct.  MRCP negative for CBD stone but concerning for pancreatic mass.  ERCP not possible secondary to anatomy per surgery.  Status post drain placement 5/28 by IR.  A & P  Jaundice, elevated LFTs, concern for pancreatic mass.  Hepatitis panel negative. -- Transaminases trending down, total bilirubin down significantly.  Drain output significant.  Mild pain. --Improving.  Check LFTs in a.m., can probably go home tomorrow if cleared by gastroenterology.  Anxiety, depression -- Remains stable. Continue quetiapine.  Disposition Plan:  Discussion:   Status is: Inpatient  Remains inpatient appropriate because:Inpatient level of care appropriate due to severity of illness   Dispo: The patient is from: Home              Anticipated d/c is to: Home              Patient currently is not medically stable to d/c.   Difficult to place patient No  DVT prophylaxis: SCDs Start: 09/07/20 1938   Code Status: Full Code Level of care: Med-Surg Family Communication: none  [name], MD  [facility] Direct contact: see [email or web address] (further directions at bottom of note if needed) 7PM-7AM contact night coverage as at bottom of note 09/09/2020, 4:28 PM  LOS: 2 days   Significant Hospital Events   . 527 admit for jaundice   Consults:  . GI . IR   Procedures:  . 5/28 Successful placement of a right hepatic approach transhepatic 10 Fr biliary drainage catheter with end coiled and locked within the duodenum.    Interval History/Subjective  CC: f/u abd pain  Feels better today, pain fairly well controlled, not in someone if she does not move.  No vomiting or nausea.  Objective   Vitals:  Vitals:    09/09/20 0635 09/09/20 1315  BP: 128/81 126/85  Pulse: 62 71  Resp: 18 16  Temp: 97.7 F (36.5 C) 98.8 F (37.1 C)  SpO2: 95% 97%    Exam:  Constitutional:   . Appears calm and comfortable ENMT:  . grossly normal hearing  Respiratory:  . CTA bilaterally, no w/r/r.  . Respiratory effort normal.  Cardiovascular:  . RRR, no m/r/g . No LE extremity edema   Psychiatric:  . Mental status o Mood, affect appropriate  I have personally reviewed the following:   Today's Data  . BMP unremarkable . AST down to 144 . ALT down to 390 . T bili down to 2.9  Scheduled Meds: . cholecalciferol  1,000 Units Oral Daily  . DULoxetine  60 mg Oral BID  . feeding supplement  237 mL Oral BID BM  . loratadine  10 mg Oral Daily  . metoprolol succinate  25 mg Oral Daily  . montelukast  10 mg Oral QHS  . multivitamin with minerals  1 tablet Oral Daily  . QUEtiapine  150 mg Oral QHS  . Vitamin E  800 Units Oral Daily   And  . vitamin E  200 Units Oral Daily   Continuous Infusions: . sodium chloride 20 mL/hr at 09/09/20 0800    Principal Problem:   Jaundice Active Problems:   GERD (gastroesophageal reflux disease)   Elevated LFTs   LOS: 2 days   How to contact  the [facility] Attending or Consulting provider [place] or covering provider during after hours [place], for this patient?  1. Check the care team in [facility] and look for a) attending/consulting [facility] provider listed and b) the [facility] team listed 2. Log into [email or web address] and use [place]'s universal password to access. If you do not have the password, please contact the hospital operator. 3. Locate the [facility] provider you are looking for under [facility] and [place] to a number that you can be directly reached. 4. If you still have difficulty reaching the provider, please Cel[place] the [facility] (Director on Call) for the Hospitalists listed on amion for assistance.

## 2020-09-10 DIAGNOSIS — K831 Obstruction of bile duct: Secondary | ICD-10-CM | POA: Diagnosis not present

## 2020-09-10 DIAGNOSIS — R7989 Other specified abnormal findings of blood chemistry: Secondary | ICD-10-CM | POA: Diagnosis not present

## 2020-09-10 LAB — HEPATIC FUNCTION PANEL
ALT: 254 U/L — ABNORMAL HIGH (ref 0–44)
AST: 63 U/L — ABNORMAL HIGH (ref 15–41)
Albumin: 2.9 g/dL — ABNORMAL LOW (ref 3.5–5.0)
Alkaline Phosphatase: 560 U/L — ABNORMAL HIGH (ref 38–126)
Bilirubin, Direct: 0.9 mg/dL — ABNORMAL HIGH (ref 0.0–0.2)
Indirect Bilirubin: 1.5 mg/dL — ABNORMAL HIGH (ref 0.3–0.9)
Total Bilirubin: 2.4 mg/dL — ABNORMAL HIGH (ref 0.3–1.2)
Total Protein: 5.7 g/dL — ABNORMAL LOW (ref 6.5–8.1)

## 2020-09-10 MED ORDER — FAMOTIDINE 20 MG PO TABS
20.0000 mg | ORAL_TABLET | Freq: Every day | ORAL | Status: DC
Start: 2020-09-10 — End: 2020-11-10

## 2020-09-10 MED ORDER — DIPHENHYDRAMINE HCL 25 MG PO CAPS: 25.0000 mg | ORAL_CAPSULE | Freq: Four times a day (QID) | ORAL | Status: DC | PRN

## 2020-09-10 MED ORDER — SODIUM CHLORIDE 0.9% FLUSH
5.0000 mL | Freq: Three times a day (TID) | INTRAVENOUS | Status: DC
  Administered 2020-09-10: 5 mL

## 2020-09-10 MED ORDER — CALCIUM CARBONATE-VITAMIN D 500-200 MG-UNIT PO TABS: 1.0000 | ORAL_TABLET | Freq: Every evening | ORAL | Status: AC

## 2020-09-10 MED ORDER — FAMOTIDINE 20 MG PO TABS: 20.0000 mg | ORAL_TABLET | Freq: Every day | ORAL | Status: DC

## 2020-09-10 NOTE — Discharge Summary (Signed)
Physician Discharge Summary  Brittany [name] WLS:[id number] DOB: April 28, 1965 DOA: 09/07/2020  PCP: [name], FNP  Admit date: 09/07/2020 Discharge date: 09/10/2020  Recommendations for Outpatient Follow-up:   Obstructive jaundice, elevated LFTs, biliary stricture, ddx includes inflammatory, but concern for pancreatic mass.  Hepatitis panel negative. --Will have outpatient follow-up with Dr. Ardis Hughs for EUS and interventional radiology for definitive cholangiogram and potential biliary brush biopsy.    Follow-up Information    [name], FNP Follow up.   Specialty: Internal Medicine Why: as needed Contact information: [place] [address] [phone]        [name], MD Follow up.   Specialty: Gastroenterology Why: Office will contact with follow-up information. Contact information: [address] [place] [address] [phone]        Sandi Mariscal, MD Follow up.   Specialties: Interventional Radiology, Radiology Why: Dr. Ardis Hughs will coordinate follow-up with Dr. Pascal Lux in regard to the drain and biopsy. Contact information: [address] [place] [address] [phone]                Discharge Diagnoses: Principal diagnosis is #1 Principal Problem:   Obstructive jaundice Active Problems:   GERD (gastroesophageal reflux disease)   Elevated LFTs   Discharge Condition: improved Disposition: home  Diet recommendation:  Diet Orders (From admission, onward)    Start     Ordered   09/10/20 0000  Diet - low sodium heart healthy        09/10/20 1535   09/09/20 1131  Diet regular Room service appropriate? Yes; Fluid consistency: Thin  Diet effective now       Question Answer Comment  Room service appropriate? Yes   Fluid consistency: Thin      09/09/20 1130           Filed Weights   09/07/20 1033  Weight: 118 kg    HPI/Hospital Course:   55 year old woman PMH of Roux-en-Y 2015  presented with jaundice. Right upper quadrant and epigastric pain.  Found to have elevated LFTs and dilated common bile duct.  MRCP negative for CBD stone but concerning for pancreatic mass.  ERCP not possible secondary to anatomy per surgery.  Status post drain placement 5/28 by IR.  Obstructive jaundice, elevated LFTs, biliary stricture, ddx includes inflammatory, but concern for pancreatic mass.  Hepatitis panel negative. -- Transaminases continue to trend down, as does total bilirubin and alkaline phosphatase.  Drain output significantly decreased. Clear for discharge per Dr. Loletha Carrow. --Will have outpatient follow-up with Dr. Ardis Hughs for EUS and interventional radiology for definitive cholangiogram and potential biliary brush biopsy.  Macular rash  --distribution c/w contact dermatitis from gown. No s/s serious reaction. expect spontaneous resolution  Anxiety, depression -- Stable.  Continue quetiapine.   Significant Hospital Events    527 admit for jaundice  Consults:   GI  IR  Procedures:   5/28 Successful placement of arighthepatic approach transhepatic 10 Fr biliary drainage catheter with end coiled and locked within the duodenum.   Today's assessment: See progress note same day   Discharge Instructions  Discharge Instructions    Diet - low sodium heart healthy   Complete by: As directed    Discharge instructions   Complete by: As directed    Call your physician or seek immediate medical attention for pain, fever, vomiting, increased drainage, yellow skin, dark urine, pale stool or worsening of condition.   Discharge wound care:   Complete  by: As directed    Keep drain site clean and dry.   Increase activity slowly   Complete by: As directed      Allergies as of 09/10/2020      Reactions   Contrast Media [iodinated Diagnostic Agents] Hives   Amoxicillin Hives   .Marland KitchenHas patient had a PCN reaction causing immediate rash, facial/tongue/throat swelling, SOB or  lightheadedness with hypotension: Yes Has patient had a PCN reaction causing severe rash involving mucus membranes or skin necrosis: No Has patient had a PCN reaction that required hospitalization No Has patient had a PCN reaction occurring within the last 10 years: No If all of the above answers are "NO", then may proceed with Cephalosporin use.   Bactrim [sulfamethoxazole-trimethoprim] Hives, Rash   Lamictal [lamotrigine] Hives, Rash      Medication List    TAKE these medications   calcium-vitamin D 500-200 MG-UNIT tablet Commonly known as: OSCAL WITH D Take 1 tablet by mouth every evening. For bone health   cetirizine 10 MG tablet Commonly known as: ZYRTEC Take 10 mg by mouth 2 (two) times daily.   cholecalciferol 25 MCG (1000 UNIT) tablet Commonly known as: VITAMIN D Take 1 tablet (1,000 Units total) by mouth daily. For bone health   DULoxetine 60 MG capsule Commonly known as: CYMBALTA Take 1 capsule (60 mg total) by mouth 2 (two) times daily.   EPINEPHrine 0.3 mg/0.3 mL Soaj injection Commonly known as: EpiPen 2-Pak Inject 0.3 mg into the muscle as needed for anaphylaxis.   famotidine 20 MG tablet Commonly known as: PEPCID Take 1 tablet (20 mg total) by mouth daily.   metoprolol succinate 25 MG 24 hr tablet Commonly known as: Toprol XL Take 1 tablet (25 mg total) by mouth daily.   montelukast 10 MG tablet Commonly known as: SINGULAIR TAKE 1 TABLET(10 MG) BY MOUTH AT BEDTIME What changed: See the new instructions.   Multi-Vitamin tablet Take 1 tablet by mouth daily.   QUEtiapine 50 MG tablet Commonly known as: SEROQUEL TAKE 3 TABLETS(150 MG) BY MOUTH AT BEDTIME What changed:   how much to take  how to take this  when to take this  additional instructions   vitamin B-12 500 MCG tablet Commonly known as: CYANOCOBALAMIN Take 1 tablet (500 mcg total) by mouth daily. For Vitamin B-12 replacement What changed: additional instructions   vitamin E 1000  UNIT capsule Take 1,000 Units by mouth daily.   Xolair 150 MG injection Generic drug: omalizumab INJECT 300 MG UNDER THE SKIN EVERY 14 DAYS What changed: See the new instructions.            Discharge Care Instructions  (From admission, onward)         Start     Ordered   09/10/20 0000  Discharge wound care:       Comments: Keep drain site clean and dry.   09/10/20 1535         Allergies  Allergen Reactions  . Contrast Media [Iodinated Diagnostic Agents] Hives  . Amoxicillin Hives    .Marland KitchenHas patient had a PCN reaction causing immediate rash, facial/tongue/throat swelling, SOB or lightheadedness with hypotension: Yes Has patient had a PCN reaction causing severe rash involving mucus membranes or skin necrosis: No Has patient had a PCN reaction that required hospitalization No Has patient had a PCN reaction occurring within the last 10 years: No If all of the above answers are "NO", then may proceed with Cephalosporin use.   . Bactrim [Sulfamethoxazole-Trimethoprim]  Hives and Rash  . Lamictal [Lamotrigine] Hives and Rash    The results of significant diagnostics from this hospitalization (including imaging, microbiology, ancillary and laboratory) are listed below for reference.    Significant Diagnostic Studies: US Abdomen Complete  Result Date: 08/30/2020 CLINICAL DATA:  Elevated LFT EXAM: ABDOMEN ULTRASOUND COMPLETE COMPARISON:  CT 09/05/2016 FINDINGS: Gallbladder: Multiple shadowing stones measuring up to 9 mm. Normal wall thickness. Negative sonographic Murphy. Common bile duct: Diameter: Up to 7.4 mm Liver: Diffusely echogenic. No focal hepatic abnormality. Portal vein is patent on color Doppler imaging with normal direction of blood flow towards the liver. IVC: No abnormality visualized. Pancreas: The duct is slightly dilated. This is a chronic finding as compared with the prior CT. Spleen: Size and appearance within normal limits. Right Kidney: Length: 12.1 cm.  Echogenicity within normal limits. No mass or hydronephrosis visualized. Left Kidney: Length: 12.1 cm. Echogenicity within normal limits. No mass or hydronephrosis visualized. Abdominal aorta: No aneurysm visualized. Other findings: None. IMPRESSION: 1. Cholelithiasis without sonographic evidence for acute cholecystitis. 2. Diffusely echogenic liver consistent with hepatic steatosis and or hepatocellular disease 3. Slightly dilated common bile duct up to 7 mm. If ductal obstruction is suspected, correlation with MRCP could be obtained. Slightly dilated pancreatic duct stable as compared to CT from 2018. Electronically Signed   By: Donavan Foil M.D.   On: 08/30/2020 20:46   MR 3D Recon At Scanner  Result Date: 09/07/2020 CLINICAL DATA:  55 year old female with history of right upper quadrant abdominal pain. Common bile duct dilatation noted on ultrasound examination. Follow-up study. EXAM: MRI ABDOMEN WITHOUT AND WITH CONTRAST (INCLUDING MRCP) TECHNIQUE: Multiplanar multisequence MR imaging of the abdomen was performed both before and after the administration of intravenous contrast. Heavily T2-weighted images of the biliary and pancreatic ducts were obtained, and three-dimensional MRCP images were rendered by post processing. CONTRAST:  40mL GADAVIST GADOBUTROL 1 MMOL/ML IV SOLN COMPARISON:  No prior abdominal MRI. Abdominal ultrasound 09/07/2020. FINDINGS: Lower chest: Unremarkable. Hepatobiliary: In segment 2 of the liver (axial image 10 of series 15) there is a 2.3 x 2.0 cm T1 hypointense, T2 hyperintense lesion which demonstrates early peripheral nodular hyperenhancement with centripetal filling on delayed images, diagnostic of a cavernous hemangioma. No other suspicious cystic or solid hepatic lesions. Moderate intrahepatic biliary ductal dilatation noted on MRCP images. Common bile duct is dilated measuring 14 mm in the porta hepatis. Abrupt cut off of the common bile duct (coronal MRCP image 35 of series  8), favored to reflect a stricture. No definite filling defect within the common bile duct to suggest choledocholithiasis. There are numerous filling defects within the gallbladder, compatible with tiny gallstones. Gallbladder is moderately distended. Gallbladder wall thickness is normal. No pericholecystic fluid or surrounding inflammatory changes. Pancreas: Subtle increased enhancement is noted in the head of the pancreas immediately inferior to the apparent stricture in the common bile duct, best appreciated on axial images 40 7-49 of series 25. No clearly definable mass is evident in this region, however, ductal lesion or periductal lesion is difficult to entirely exclude. No pancreatic ductal dilatation noted on MRCP images. No peripancreatic fluid collections or inflammatory changes. Spleen: Lobular contour of the lateral aspect of the spleen, potentially sequela of prior trauma or prior infarctions. Adrenals/Urinary Tract: Bilateral kidneys and adrenal glands are normal in appearance. No hydroureteronephrosis in the visualized portions of the abdomen. Stomach/Bowel: Ventral hernia containing a portion of the transverse colon, incompletely imaged. Visualized portions are otherwise unremarkable. Vascular/Lymphatic: No aneurysm  identified in the visualized abdominal vasculature. No lymphadenopathy noted in the abdomen. Other: No significant volume of ascites noted in the visualized portions of the peritoneal cavity. Musculoskeletal: No aggressive appearing osseous lesions are noted in the visualized portions of the skeleton. IMPRESSION: 1. Moderate intra and extrahepatic biliary ductal dilatation with what appears to be a stricture of the mid common bile duct. Notably, there are some areas of mildly increased enhancement adjacent to the mid to distal common bile duct in the head of the pancreas. Although no discrete pancreatic mass is confidently identified, the possibility of a very small ductal or periductal  lesion is not excluded. Further evaluation with endoscopic ultrasound is recommended in the near future to exclude underlying neoplasm. 2. Cholelithiasis without evidence of acute cholecystitis. No choledocholithiasis is noted at this time. 3. 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 4. Ventral hernia containing a portion of the transverse colon incompletely imaged. Electronically Signed   By: Vinnie Langton M.D.   On: 09/07/2020 19:10   US Abdomen Limited  Result Date: 09/07/2020 CLINICAL DATA:  Acute right upper quadrant abdominal pain. EXAM: ULTRASOUND ABDOMEN LIMITED RIGHT UPPER QUADRANT COMPARISON:  Aug 30, 2020.  Sep 05, 2016. FINDINGS: Gallbladder: Cholelithiasis is noted without significant gallbladder wall thickening or pericholecystic fluid. No sonographic Murphy's sign is noted. Common bile duct: Diameter: 8 mm which is mildly dilated. Liver: 1.8 cm cyst is seen in left hepatic lobe. Mildly increased echogenicity of hepatic parenchyma is noted suggesting hepatic steatosis. Portal vein is patent on color Doppler imaging with normal direction of blood flow towards the liver. Other: None. IMPRESSION: Cholelithiasis is noted without evidence of cholecystitis. Common bile duct is mildly dilated at 8 mm. Correlation with liver function tests is recommended to rule out distal common bile duct obstruction. MRCP may be performed for further evaluation. Possible hepatic steatosis.  Left hepatic cyst. Electronically Signed   By: Marijo Conception M.D.   On: 09/07/2020 12:27   MR ABDOMEN MRCP W WO CONTAST  Result Date: 09/07/2020 CLINICAL DATA:  55 year old female with history of right upper quadrant abdominal pain. Common bile duct dilatation noted on ultrasound examination. Follow-up study. EXAM: MRI ABDOMEN WITHOUT AND WITH CONTRAST (INCLUDING MRCP) TECHNIQUE: Multiplanar multisequence MR imaging of the abdomen was performed both before and after the administration of  intravenous contrast. Heavily T2-weighted images of the biliary and pancreatic ducts were obtained, and three-dimensional MRCP images were rendered by post processing. CONTRAST:  33mL GADAVIST GADOBUTROL 1 MMOL/ML IV SOLN COMPARISON:  No prior abdominal MRI. Abdominal ultrasound 09/07/2020. FINDINGS: Lower chest: Unremarkable. Hepatobiliary: In segment 2 of the liver (axial image 10 of series 15) there is a 2.3 x 2.0 cm T1 hypointense, T2 hyperintense lesion which demonstrates early peripheral nodular hyperenhancement with centripetal filling on delayed images, diagnostic of a cavernous hemangioma. No other suspicious cystic or solid hepatic lesions. Moderate intrahepatic biliary ductal dilatation noted on MRCP images. Common bile duct is dilated measuring 14 mm in the porta hepatis. Abrupt cut off of the common bile duct (coronal MRCP image 35 of series 8), favored to reflect a stricture. No definite filling defect within the common bile duct to suggest choledocholithiasis. There are numerous filling defects within the gallbladder, compatible with tiny gallstones. Gallbladder is moderately distended. Gallbladder wall thickness is normal. No pericholecystic fluid or surrounding inflammatory changes. Pancreas: Subtle increased enhancement is noted in the head of the pancreas immediately inferior to the apparent stricture in the common  bile duct, best appreciated on axial images 40 7-49 of series 25. No clearly definable mass is evident in this region, however, ductal lesion or periductal lesion is difficult to entirely exclude. No pancreatic ductal dilatation noted on MRCP images. No peripancreatic fluid collections or inflammatory changes. Spleen: Lobular contour of the lateral aspect of the spleen, potentially sequela of prior trauma or prior infarctions. Adrenals/Urinary Tract: Bilateral kidneys and adrenal glands are normal in appearance. No hydroureteronephrosis in the visualized portions of the abdomen.  Stomach/Bowel: Ventral hernia containing a portion of the transverse colon, incompletely imaged. Visualized portions are otherwise unremarkable. Vascular/Lymphatic: No aneurysm identified in the visualized abdominal vasculature. No lymphadenopathy noted in the abdomen. Other: No significant volume of ascites noted in the visualized portions of the peritoneal cavity. Musculoskeletal: No aggressive appearing osseous lesions are noted in the visualized portions of the skeleton. IMPRESSION: 1. Moderate intra and extrahepatic biliary ductal dilatation with what appears to be a stricture of the mid common bile duct. Notably, there are some areas of mildly increased enhancement adjacent to the mid to distal common bile duct in the head of the pancreas. Although no discrete pancreatic mass is confidently identified, the possibility of a very small ductal or periductal lesion is not excluded. Further evaluation with endoscopic ultrasound is recommended in the near future to exclude underlying neoplasm. 2. Cholelithiasis without evidence of acute cholecystitis. No choledocholithiasis is noted at this time. 3. 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 4. Ventral hernia containing a portion of the transverse colon incompletely imaged. Electronically Signed   By: Vinnie Langton M.D.   On: 09/07/2020 19:10   IR BILIARY DRAIN PLACEMENT WITH CHOLANGIOGRAM  Result Date: 09/08/2020 INDICATION: Obstructive jaundice of uncertain etiology. Patient is not a candidate for ERCP given history of gastric bypass. As such, request made for placement of an image guided percutaneous biliary drainage catheter for infection source control purposes. EXAM: ULTRASOUND AND FLUOROSCOPIC GUIDED PERCUTANEOUS TRANSHEPATIC CHOLANGIOGRAM AND BILIARY TUBE PLACEMENT COMPARISON:  MRCP-09/07/2020; right upper quadrant abdominal ultrasound-09/07/2020 MEDICATIONS: Zosyn 3.375 g IV; The antibiotic was administered with an  appropriate time frame prior to the initiation of the procedure CONTRAST:  40 cc Isovue-300 - administered into the biliary tree. ANESTHESIA/SEDATION: Moderate (conscious) sedation was employed during this procedure. A total of Versed 8 mg, Dilaudid 1 mg and Fentanyl 300 mcg was administered intravenously. Moderate Sedation Time: 60 minutes. The patient's level of consciousness and vital signs were monitored continuously by radiology nursing throughout the procedure under my direct supervision. FLUOROSCOPY TIME:  15 minutes, 42 seconds (614 mGy) COMPLICATIONS: None immediate. TECHNIQUE: Informed written consent was obtained from the patient after a discussion of the risks, benefits and alternatives to treatment. Questions regarding the procedure were encouraged and answered. A timeout was performed prior to the initiation of the procedure. The right upper abdominal quadrant was prepped and draped in the usual sterile fashion, and a sterile drape was applied covering the operative field. Maximum barrier sterile technique with sterile gowns and gloves were used for the procedure. A timeout was performed prior to the initiation of the procedure. Ultrasound scanning of the right upper abdominal quadrant was performed to delineate the anatomy and avoid transgression of the gallbladder or the pleural. A spot along the right mid axillary line was marked fluoroscopically inferior to the right costophrenic angle. After the overlying soft tissues were anesthetized with 1% Lidocaine with epinephrine, under direct ultrasound guidance, a 22 gauge Chiba needle was utilized to  attempt to cannulate a peripheral duct within the right intrahepatic biliary tree. Ultimately, this proved challenging given lack of significant dilatation of the peripheral aspect of the biliary tree as well as patient body habitus and associated poor sonographic window. Ultimately, the central aspect of the right intrahepatic biliary tree was successfully  cannulated with the inner 3 French catheter from the [place] set allowing opacification of smaller peripheral ducts. Next, under direct fluoroscopic guidance, an additional 22 gauge Chiba needle was utilized to access a nondilated peripheral duct within the right intrahepatic biliary tree. Appropriate access was confirmed with advancement of a Nitrex wire centrally. The track was dilated with the remainder of the [place] set. Next, a 4 French angled glide catheter was advanced through the outer sheath of the Accust[place] set and with the use of a regular glidewire, advanced through the biliary hilum, common bile duct and ampulla to the level of the duodenum. Contrast injection confirmed appropriate positioning. Under intermittent fluoroscopic guidance and over an Amplatz wire, the track was dilated ultimately allowing placement of a 10 [place] biliary drainage catheter with coil ultimately locked within the duodenum. Contrast was injected and a completion radiographs were obtained in various obliquities. The catheter was connected to a drainage bag which yielded the brisk return of dark bile. The catheter was secured to the skin with an interrupted suture and StatLock device. Dressings were applied. The patient tolerated the procedure well without immediate postprocedural complication. FINDINGS: Sonographic evaluation of the liver demonstrates dilatation of the central aspect of the biliary tree with lack of significant dilatation peripherally. Initially, the central aspect of the right biliary tree was accessed allowing opacification of the smaller peripheral ducts. Next, utilizing a two stick technique, the peripheral aspect of the right biliary tree was successfully accessed allowing placement of a 10 [place] biliary drainage catheter with end ultimately coiled and locked within the duodenum and radiopaque side marker located at the level of the biliary hilum. Limited contrast injection demonstrates tapered  narrowing/subtotal occlusion involving the mid/distal aspect of the CBD (series 5). IMPRESSION: Successful placement of a 10.2 French percutaneous biliary drainage catheter with end coiled and locked within the duodenum. PLAN: Recommend obtaining daily LFTs. Once patient's bilirubin has reached a nadir, the patient may return for definitive cholangiogram and potential fluoroscopic guided biliary brush biopsy as indicated. Electronically Signed   By: Sandi Mariscal M.D.   On: 09/08/2020 15:35    Microbiology: Recent Results (from the past 240 hour(s))  Urine Culture     Status: None   Collection Time: 09/07/20  9:25 AM   Specimen: Urine  Result Value Ref Range Status   MICRO NUMBER: [id number]  Final   SPECIMEN QUALITY: Adequate  Final   Sample Source NOT GIVEN  Final   STATUS: FINAL  Final   Result:   Final    Mixed genital flora isolated. These superficial bacteria are not indicative of a urinary tract infection. No further organism identification is warranted on this specimen. If clinically indicated, recollect clean-catch, mid-stream urine and transfer  immediately to Urine Culture Transport Tube.   SARS CORONAVIRUS 2 (TAT 6-24 HRS) Nasopharyngeal Nasopharyngeal Swab     Status: None   Collection Time: 09/07/20  5:28 PM   Specimen: Nasopharyngeal Swab  Result Value Ref Range Status   SARS Coronavirus 2 NEGATIVE NEGATIVE Final    Comment: (NOTE) SARS-CoV-2 target nucleic acids are NOT DETECTED.  The SARS-CoV-2 RNA is generally detectable in upper and lower respiratory specimens during the acute  phase of infection. Negative results do not preclude SARS-CoV-2 infection, do not rule out co-infections with other pathogens, and should not be used as the sole basis for treatment or other patient management decisions. Negative results must be combined with clinical observations, patient history, and epidemiological information. The expected result is Negative.  Fact Sheet for  Patients: SugarRoll.be  Fact Sheet for Healthcare Providers: https://www.woods-mathews.com/  This test is not yet approved or cleared by the [place] FDA and  has been authorized for detection and/or diagnosis of SARS-CoV-2 by FDA under an Emergency Use Authorization (EUA). This EUA will remain  in effect (meaning this test can be used) for the duration of the COVID-19 declaration under Se ction 564(b)(1) of the Act, 21 U.S.C. section 360bbb-3(b)(1), unless the authorization is terminated or revoked sooner.  Performed at [facility], [place] [address], [place], [place] [address]      Labs: Basic Metabolic Panel: Recent Labs  Lab 09/07/20 1155 09/08/20 0349 09/09/20 0424  NA 139 139 138  K 3.1* 3.5 3.8  CL 107 105 105  CO2 25 25 25   GLUCOSE 122* 107* 145*  BUN 6 6 6   CREATININE 0.60 0.38* 0.37*  CALCIUM 9.0 8.6* 9.0  MG 1.8  --   --    Liver Function Tests: Recent Labs  Lab 09/07/20 1155 09/08/20 0349 09/09/20 0424 09/10/20 0458  AST 269* 262* 144* 63*  ALT 464* 403* 390* 254*  ALKPHOS 708* 692* 692* 560*  BILITOT 3.5* 6.5* 2.9* 2.4*  PROT 6.6 6.0* 6.3* 5.7*  ALBUMIN 3.2* 2.8* 3.1* 2.9*   Recent Labs  Lab 09/07/20 1155  LIPASE 17   CBC: Recent Labs  Lab 09/07/20 1155 09/08/20 0349  WBC 5.6 4.4  NEUTROABS  --  2.5  HGB 12.9 11.4*  HCT 37.2 33.0*  MCV 97.4 97.3  PLT 270 244    Principal Problem:   Obstructive jaundice Active Problems:   GERD (gastroesophageal reflux disease)   Elevated LFTs   Time coordinating discharge: 25 minutes  Signed:  [name], MD  [facility]  09/10/2020, 3:39 PM

## 2020-09-10 NOTE — Progress Notes (Signed)
Gave pt a 4 day supply of sterile 10 cc NS flushes, split guaze, tape, graduated cylinder and sheet to record her biliary drainage. Reviewed/demonstrated w/ pt how/when to empty, how to secure w a safety pin, how to flush, how to change the dressing, etc. Questions answered and pt demonstrated good technique of above after teaching.

## 2020-09-10 NOTE — Progress Notes (Addendum)
Daily Rounding Note  09/10/2020, 12:30 PM  LOS: 3 days   SUBJECTIVE:   Chief complaint:   Obstructive jaundice.      PERC biliary drain output 1750 mL..  790 mL on previous 2 days. VSS w/o fever.   Feeling well.  Still w anorexia but prandial associated nausea resolved.  Developed pruritic, macular rash on torso, upper legs.  Pattern of occurrence c/w hospital gown exposure.  Has history of Derm sensitivities and receives monthly shots to prevent hives.  Treated with 1 dose of Solu-Medrol on Saturday, prn Benadryl.  .  Only antibiotic exposure was Cipro on 5/27.  Prior to biliary drain placement.  Overall feels much improved, urine is less dark.  Feels ready to discharge home.   OBJECTIVE:         Vital signs in last 24 hours:    Temp:  [98.8 F (37.1 C)-99.1 F (37.3 C)] 99.1 F (37.3 C) (05/30 0608) Pulse Rate:  [68-71] 68 (05/30 0608) Resp:  [16-18] 18 (05/30 0608) BP: (115-133)/(62-87) 115/62 (05/30 0608) SpO2:  [93 %-97 %] 93 % (05/30 0608) Last BM Date: 09/09/20 Filed Weights   09/07/20 1033  Weight: 118 kg   General: Pleasant, looks well.  Not jaundiced.  May be some lingering scleral icterus but very hard to appreciate. Heart: RRR. Chest: Clear bilaterally no labored breathing. Abdomen: Soft without tenderness.  Drain on right abdomen with dark bilious material. Extremities: No CCE. Neuro/Psych:, Calm, fluid speech.  No gross deficits or weakness.  Intake/Output from previous day: 05/29 0701 - 05/30 0700 In: 3445 [P.O.:1920; I.V.:1515] Out: 3490 [Urine:2700; Drains:790]  Intake/Output this shift: Total I/O In: 370 [P.O.:360; I.V.:5; Other:5] Out: 400 [Urine:400]  Lab Results: Recent Labs    09/08/20 0349  WBC 4.4  HGB 11.4*  HCT 33.0*  PLT 244   BMET Recent Labs    09/08/20 0349 09/09/20 0424  NA 139 138  K 3.5 3.8  CL 105 105  CO2 25 25  GLUCOSE 107* 145*  BUN 6 6  CREATININE  0.38* 0.37*  CALCIUM 8.6* 9.0   LFT Recent Labs    09/08/20 0349 09/09/20 0424 09/10/20 0458  PROT 6.0* 6.3* 5.7*  ALBUMIN 2.8* 3.1* 2.9*  AST 262* 144* 63*  ALT 403* 390* 254*  ALKPHOS 692* 692* 560*  BILITOT 6.5* 2.9* 2.4*  BILIDIR  --   --  0.9*  IBILI  --   --  1.5*   PT/INR Recent Labs    09/07/20 1845  LABPROT 11.7  INR 0.9   Hepatitis Panel Recent Labs    09/07/20 1845  HEPBSAG NON REACTIVE  HCVAB NON REACTIVE  HEPAIGM NON REACTIVE  HEPBIGM NON REACTIVE    Studies/Results: IR BILIARY DRAIN PLACEMENT WITH CHOLANGIOGRAM  Result Date: 09/08/2020 INDICATION: Obstructive jaundice of uncertain etiology. Patient is not a candidate for ERCP given history of gastric bypass. As such, request made for placement of an image guided percutaneous biliary drainage catheter for infection source control purposes. EXAM: ULTRASOUND AND FLUOROSCOPIC GUIDED PERCUTANEOUS TRANSHEPATIC CHOLANGIOGRAM AND BILIARY TUBE PLACEMENT COMPARISON:  MRCP-09/07/2020; right upper quadrant abdominal ultrasound-09/07/2020 MEDICATIONS: Zosyn 3.375 g IV; The antibiotic was administered with an appropriate time frame prior to the initiation of the procedure CONTRAST:  40 cc Isovue-300 - administered into the biliary tree. ANESTHESIA/SEDATION: Moderate (conscious) sedation was employed during this procedure. A total of Versed 8 mg, Dilaudid 1 mg and Fentanyl 300 mcg was administered intravenously. Moderate  Sedation Time: 60 minutes. The patient's level of consciousness and vital signs were monitored continuously by radiology nursing throughout the procedure under my direct supervision. FLUOROSCOPY TIME:  15 minutes, 42 seconds (948 mGy) COMPLICATIONS: None immediate. TECHNIQUE: Informed written consent was obtained from the patient after a discussion of the risks, benefits and alternatives to treatment. Questions regarding the procedure were encouraged and answered. A timeout was performed prior to the initiation  of the procedure. The right upper abdominal quadrant was prepped and draped in the usual sterile fashion, and a sterile drape was applied covering the operative field. Maximum barrier sterile technique with sterile gowns and gloves were used for the procedure. A timeout was performed prior to the initiation of the procedure. Ultrasound scanning of the right upper abdominal quadrant was performed to delineate the anatomy and avoid transgression of the gallbladder or the pleural. A spot along the right mid axillary line was marked fluoroscopically inferior to the right costophrenic angle. After the overlying soft tissues were anesthetized with 1% Lidocaine with epinephrine, under direct ultrasound guidance, a 22 gauge Chiba needle was utilized to attempt to cannulate a peripheral duct within the right intrahepatic biliary tree. Ultimately, this proved challenging given lack of significant dilatation of the peripheral aspect of the biliary tree as well as patient body habitus and associated poor sonographic window. Ultimately, the central aspect of the right intrahepatic biliary tree was successfully cannulated with the inner 3 French catheter from the [place] set allowing opacification of smaller peripheral ducts. Next, under direct fluoroscopic guidance, an additional 22 gauge Chiba needle was utilized to access a nondilated peripheral duct within the right intrahepatic biliary tree. Appropriate access was confirmed with advancement of a Nitrex wire centrally. The track was dilated with the remainder of the [place] set. Next, a 4 French angled glide catheter was advanced through the outer sheath of the Accust[place] set and with the use of a regular glidewire, advanced through the biliary hilum, common bile duct and ampulla to the level of the duodenum. Contrast injection confirmed appropriate positioning. Under intermittent fluoroscopic guidance and over an Amplatz wire, the track was dilated ultimately allowing  placement of a 10 [place] biliary drainage catheter with coil ultimately locked within the duodenum. Contrast was injected and a completion radiographs were obtained in various obliquities. The catheter was connected to a drainage bag which yielded the brisk return of dark bile. The catheter was secured to the skin with an interrupted suture and StatLock device. Dressings were applied. The patient tolerated the procedure well without immediate postprocedural complication. FINDINGS: Sonographic evaluation of the liver demonstrates dilatation of the central aspect of the biliary tree with lack of significant dilatation peripherally. Initially, the central aspect of the right biliary tree was accessed allowing opacification of the smaller peripheral ducts. Next, utilizing a two stick technique, the peripheral aspect of the right biliary tree was successfully accessed allowing placement of a 10 [place] biliary drainage catheter with end ultimately coiled and locked within the duodenum and radiopaque side marker located at the level of the biliary hilum. Limited contrast injection demonstrates tapered narrowing/subtotal occlusion involving the mid/distal aspect of the CBD (series 5). IMPRESSION: Successful placement of a 10.2 French percutaneous biliary drainage catheter with end coiled and locked within the duodenum. PLAN: Recommend obtaining daily LFTs. Once patient's bilirubin has reached a nadir, the patient may return for definitive cholangiogram and potential fluoroscopic guided biliary brush biopsy as indicated. Electronically Signed   By: Sandi Mariscal M.D.   On:  09/08/2020 15:35    ASSESMENT:   *   Obstructive jaundice, distal bile duct stricture ?  Neoplastic, question inflammatory, question bile duct stone. S/p 5/28 PTC (DR Pascal Lux). Did not brush for cytology ("patient may return for definitive cholangiogram and potential fluoroscopic guided biliary brush biopsy as indicated").  Drain output yesterday was  almost half what it was the previous day.  Continuing downtrending/improving LFTs.    PLAN   *    ? EUS on Thursday 6/2 per Dr Ardis Hughs. Will discuss case with Dr. Loletha Carrow.  Patient could possibly go home today and Dr. Ardis Hughs office will be in touch with her tomorrow regarding scheduling of EUS if he chooses to pursue this.        Azucena Freed  09/10/2020, 12:30 PM Phone [phone]  I have discussed the case with the APP, and that is the plan I formulated. I personally interviewed and examined the patient.  CC: Obstructive jaundice, biliary stricture  Patient underwent successful PTC with placement of internal/external drain.  No biliary brushings were taken, as it was felt this should be done on a subsequent procedure. Patient feels well, minimal pain from the drain site.  She has been taught how to flush and manage it and feels comfortable doing that at home.  LFTs are slowly decreasing.  I agree she could be safely discharged home today. I will message my partner Dr. Ardis Hughs so he can consider the next move regarding imaging of the pancreas had access to the bile duct.  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  She may need referral to an academic center for advanced endoscopist to consider endoscopic or surgical access to the excluded stomach to do EUS and ERCP.   Nelida Meuse III Office: [phone]

## 2020-09-10 NOTE — Plan of Care (Addendum)
Reviewed written d/c instructions w pt and all questions answered. D/c per w/c w all belongings in stable condition.

## 2020-09-10 NOTE — Progress Notes (Signed)
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 Right upper quadrant and epigastric pain.  Found to have elevated LFTs and dilated common bile duct.  MRCP negative for CBD stone but concerning for pancreatic mass.  ERCP not possible secondary to anatomy per surgery.  Status post drain placement 5/28 by IR.  A & P  Obstructive jaundice, elevated LFTs, concern for pancreatic mass.  Hepatitis panel negative. -- Transaminases continue to trend down, as does total bilirubin and alkaline phosphatase.  Drain output significantly decreased. --Await further GI recommendations. --We will have outpatient follow-up with Dr. Ardis Hughs for EUS and interventional radiology for definitive cholangiogram and potential biliary brush biopsy.  Anxiety, depression -- Stable.  Continue quetiapine.  Disposition Plan:  Discussion:   Status is: Inpatient  Remains inpatient appropriate because:Inpatient level of care appropriate due to severity of illness   Dispo: The patient is from: Home              Anticipated d/c is to: Home              Patient currently is not medically stable to d/c.   Difficult to place patient No  DVT prophylaxis: SCDs Start: 09/07/20 1938   Code Status: Full Code Level of care: Med-Surg Family Communication: none  [name], MD  [facility] Direct contact: see [email or web address] (further directions at bottom of note if needed) 7PM-7AM contact night coverage as at bottom of note 09/10/2020, 2:47 PM  LOS: 3 days   Significant Hospital Events   . 527 admit for jaundice   Consults:  . GI . IR   Procedures:  . 5/28 Successful placement of a right hepatic approach transhepatic 10 Fr biliary drainage catheter with end coiled and locked within the duodenum.    Interval History/Subjective  CC: f/u abd pain  Pain, tolerating diet.   No significant.  However did develop macular rash which is quite pruritic.  Seems to be in the distribution of hospital gown.  She has problems with allergies and receives monthly injections with Xolair, is on cetirizine twice a day and Pepcid and followed closely by outpatient allergist Dr. Rexene Alberts.  Objective   Vitals:  Vitals:   09/10/20 0608 09/10/20 1409  BP: 115/62 132/86  Pulse: 68 73  Resp: 18 18  Temp: 99.1 F (37.3 C) 98 F (36.7 C)  SpO2: 93% 97%    Exam: Constitutional:   . Appears calm and comfortable Respiratory:  . CTA bilaterally, no w/r/r.  . Respiratory effort normal.  Cardiovascular:  . RRR, no m/r/g . No LE extremity edema   Skin:  . Macular rash in distribution consistent with hospital gown, primarily over the chest, abdomen, back, inner arms where they would touch the ground, outer arms pallor.  Thighs affected but lower legs..  Some rash left neck. Psychiatric:  . Mental status o Mood, affect appropriate . judgment and insight appear intact  I have personally reviewed the following:   Today's Data  . Alkaline phosphatase, AST, ALT and total bilirubin trending down  Scheduled Meds: . cholecalciferol  1,000 Units Oral Daily  . DULoxetine  60 mg Oral BID  . famotidine  20 mg Oral Daily  . feeding supplement  237 mL Oral BID BM  . loratadine  10 mg Oral Daily  . metoprolol succinate  25 mg Oral Daily  . montelukast  10 mg Oral QHS  . multivitamin with minerals  1 tablet Oral Daily  . QUEtiapine  150 mg Oral QHS  . sodium chloride flush  5 mL Intracatheter Q8H  . Vitamin E  800 Units Oral Daily   And  . vitamin E  200 Units Oral Daily   Continuous Infusions: . sodium chloride 20 mL/hr at 09/09/20 1400    Principal Problem:   Jaundice Active Problems:   GERD (gastroesophageal reflux disease)   Elevated LFTs   LOS: 3 days   How to contact the [facility] Attending or Consulting provider [place] or covering provider during after hours [place],  for this patient?  1. Check the care team in [facility] and look for a) attending/consulting [facility] provider listed and b) the [facility] team listed 2. Log into [email or web address] and use [place]'s universal password to access. If you do not have the password, please contact the hospital operator. 3. Locate the [facility] provider you are looking for under [facility] and [place] to a number that you can be directly reached. 4. If you still have difficulty reaching the provider, please [place] the [facility] (Director on Call) for the Hospitalists listed on amion for assistance.

## 2020-09-11 ENCOUNTER — Other Ambulatory Visit: Payer: Self-pay

## 2020-09-11 ENCOUNTER — Ambulatory Visit: Payer: Self-pay

## 2020-09-11 ENCOUNTER — Telehealth: Payer: Self-pay | Admitting: Gastroenterology

## 2020-09-11 DIAGNOSIS — K831 Obstruction of bile duct: Secondary | ICD-10-CM

## 2020-09-11 NOTE — Telephone Encounter (Signed)
EUS scheduled for 6/2 at 12 noon    EUS scheduled, pt instructed and medications reviewed.  Patient instructions mailed to home.  Patient to call with any questions or concerns.

## 2020-09-11 NOTE — Telephone Encounter (Signed)
Brittany [name], She was in this past week for a 2-3 days admission, obstructive jaundice, previous Roux en Y bariatric surgery.  I favor neoplastic etiology overall. She had internal/extneral biliary drain placed and LFTs are improving.  I am planning EUS later this week, not sure if it will even be anatomically possible to get at the area in/near/around her distal bile duct (MRI was inconclusive about what is going on there).  I will discuss referral to surgery with her that day.  Woodroe Chen, I had a cancellation for my last case on this coming Thursday. Can you please put her on for that spot (upper EUS).  Also she needs LFTs on admit to endo.  Thanks

## 2020-09-12 ENCOUNTER — Telehealth: Payer: Self-pay | Admitting: Gastroenterology

## 2020-09-12 ENCOUNTER — Other Ambulatory Visit: Payer: Self-pay

## 2020-09-12 ENCOUNTER — Inpatient Hospital Stay (HOSPITAL_COMMUNITY)
Admission: EM | Admit: 2020-09-12 | Discharge: 2020-09-15 | DRG: 446 | Disposition: A | Discharge status: Home / Self Care | Payer: 59 | Attending: Internal Medicine | Admitting: Internal Medicine

## 2020-09-12 ENCOUNTER — Encounter (HOSPITAL_COMMUNITY): Payer: Self-pay

## 2020-09-12 DIAGNOSIS — R1011 Right upper quadrant pain: Secondary | ICD-10-CM

## 2020-09-12 DIAGNOSIS — K831 Obstruction of bile duct: Secondary | ICD-10-CM | POA: Diagnosis not present

## 2020-09-12 DIAGNOSIS — F32A Depression, unspecified: Secondary | ICD-10-CM | POA: Diagnosis present

## 2020-09-12 DIAGNOSIS — Z8719 Personal history of other diseases of the digestive system: Secondary | ICD-10-CM

## 2020-09-12 DIAGNOSIS — F419 Anxiety disorder, unspecified: Secondary | ICD-10-CM | POA: Diagnosis not present

## 2020-09-12 DIAGNOSIS — L501 Idiopathic urticaria: Secondary | ICD-10-CM | POA: Diagnosis not present

## 2020-09-12 DIAGNOSIS — E7800 Pure hypercholesterolemia, unspecified: Secondary | ICD-10-CM | POA: Diagnosis present

## 2020-09-12 DIAGNOSIS — Z20822 Contact with and (suspected) exposure to covid-19: Secondary | ICD-10-CM | POA: Diagnosis present

## 2020-09-12 DIAGNOSIS — R7989 Other specified abnormal findings of blood chemistry: Secondary | ICD-10-CM

## 2020-09-12 DIAGNOSIS — Z90710 Acquired absence of both cervix and uterus: Secondary | ICD-10-CM

## 2020-09-12 DIAGNOSIS — Z800 Family history of malignant neoplasm of digestive organs: Secondary | ICD-10-CM

## 2020-09-12 DIAGNOSIS — D72829 Elevated white blood cell count, unspecified: Secondary | ICD-10-CM | POA: Diagnosis present

## 2020-09-12 DIAGNOSIS — Z880 Allergy status to penicillin: Secondary | ICD-10-CM

## 2020-09-12 DIAGNOSIS — R109 Unspecified abdominal pain: Secondary | ICD-10-CM | POA: Diagnosis present

## 2020-09-12 DIAGNOSIS — E876 Hypokalemia: Secondary | ICD-10-CM | POA: Diagnosis not present

## 2020-09-12 DIAGNOSIS — Z803 Family history of malignant neoplasm of breast: Secondary | ICD-10-CM

## 2020-09-12 DIAGNOSIS — Z882 Allergy status to sulfonamides status: Secondary | ICD-10-CM

## 2020-09-12 DIAGNOSIS — R1031 Right lower quadrant pain: Secondary | ICD-10-CM | POA: Diagnosis not present

## 2020-09-12 DIAGNOSIS — Z91041 Radiographic dye allergy status: Secondary | ICD-10-CM

## 2020-09-12 DIAGNOSIS — Z888 Allergy status to other drugs, medicaments and biological substances status: Secondary | ICD-10-CM

## 2020-09-12 DIAGNOSIS — Z9884 Bariatric surgery status: Secondary | ICD-10-CM

## 2020-09-12 DIAGNOSIS — F332 Major depressive disorder, recurrent severe without psychotic features: Secondary | ICD-10-CM | POA: Diagnosis not present

## 2020-09-12 DIAGNOSIS — L508 Other urticaria: Secondary | ICD-10-CM | POA: Diagnosis present

## 2020-09-12 DIAGNOSIS — Z823 Family history of stroke: Secondary | ICD-10-CM

## 2020-09-12 DIAGNOSIS — Z8052 Family history of malignant neoplasm of bladder: Secondary | ICD-10-CM

## 2020-09-12 DIAGNOSIS — Z9689 Presence of other specified functional implants: Secondary | ICD-10-CM | POA: Diagnosis present

## 2020-09-12 DIAGNOSIS — I10 Essential (primary) hypertension: Secondary | ICD-10-CM | POA: Diagnosis present

## 2020-09-12 DIAGNOSIS — Z808 Family history of malignant neoplasm of other organs or systems: Secondary | ICD-10-CM

## 2020-09-12 DIAGNOSIS — Z8249 Family history of ischemic heart disease and other diseases of the circulatory system: Secondary | ICD-10-CM

## 2020-09-12 DIAGNOSIS — Z87891 Personal history of nicotine dependence: Secondary | ICD-10-CM

## 2020-09-12 DIAGNOSIS — Z833 Family history of diabetes mellitus: Secondary | ICD-10-CM

## 2020-09-12 LAB — CBC WITH DIFFERENTIAL/PLATELET
Abs Immature Granulocytes: 0.03 10*3/uL (ref 0.00–0.07)
Basophils Absolute: 0 10*3/uL (ref 0.0–0.1)
Basophils Relative: 0 %
Eosinophils Absolute: 0.2 10*3/uL (ref 0.0–0.5)
Eosinophils Relative: 1 %
HCT: 38.8 % (ref 36.0–46.0)
Hemoglobin: 13.2 g/dL (ref 12.0–15.0)
Immature Granulocytes: 0 %
Lymphocytes Relative: 6 %
Lymphs Abs: 0.7 10*3/uL (ref 0.7–4.0)
MCH: 33.1 pg (ref 26.0–34.0)
MCHC: 34 g/dL (ref 30.0–36.0)
MCV: 97.2 fL (ref 80.0–100.0)
Monocytes Absolute: 0.5 10*3/uL (ref 0.1–1.0)
Monocytes Relative: 5 %
Neutro Abs: 9.9 10*3/uL — ABNORMAL HIGH (ref 1.7–7.7)
Neutrophils Relative %: 88 %
Platelets: 341 10*3/uL (ref 150–400)
RBC: 3.99 MIL/uL (ref 3.87–5.11)
RDW: 13.2 % (ref 11.5–15.5)
WBC: 11.3 10*3/uL — ABNORMAL HIGH (ref 4.0–10.5)
nRBC: 0 % (ref 0.0–0.2)

## 2020-09-12 LAB — COMPREHENSIVE METABOLIC PANEL
ALT: 167 U/L — ABNORMAL HIGH (ref 0–44)
AST: 29 U/L (ref 15–41)
Albumin: 3.5 g/dL (ref 3.5–5.0)
Alkaline Phosphatase: 515 U/L — ABNORMAL HIGH (ref 38–126)
Anion gap: 10 (ref 5–15)
BUN: 11 mg/dL (ref 6–20)
CO2: 23 mmol/L (ref 22–32)
Calcium: 8.9 mg/dL (ref 8.9–10.3)
Chloride: 107 mmol/L (ref 98–111)
Creatinine, Ser: 0.5 mg/dL (ref 0.44–1.00)
GFR, Estimated: >60 mL/min (ref >60)
Glucose, Bld: 141 mg/dL — ABNORMAL HIGH (ref 70–99)
Potassium: 3.5 mmol/L (ref 3.5–5.1)
Sodium: 140 mmol/L (ref 135–145)
Total Bilirubin: 2.2 mg/dL — ABNORMAL HIGH (ref 0.3–1.2)
Total Protein: 7.1 g/dL (ref 6.5–8.1)

## 2020-09-12 LAB — RESP PANEL BY RT-PCR (FLU A&B, COVID) ARPGX2
Influenza A by PCR: NEGATIVE
Influenza B by PCR: NEGATIVE
SARS Coronavirus 2 by RT PCR: NEGATIVE

## 2020-09-12 LAB — LIPASE, BLOOD: Lipase: 17 U/L (ref 11–51)

## 2020-09-12 MED ORDER — KETOROLAC TROMETHAMINE 30 MG/ML IJ SOLN
30.0000 mg | Freq: Once | INTRAMUSCULAR | Status: AC
Start: 2020-09-12 — End: 2020-09-12
  Administered 2020-09-12: 30 mg via INTRAVENOUS
  Filled 2020-09-12: qty 1

## 2020-09-12 MED ORDER — FAMOTIDINE 20 MG PO TABS
20.0000 mg | ORAL_TABLET | Freq: Every day | ORAL | Status: DC
  Administered 2020-09-13 – 2020-09-15 (×3): 20 mg via ORAL
  Filled 2020-09-12 (×3): qty 1

## 2020-09-12 MED ORDER — FENTANYL CITRATE (PF) 100 MCG/2ML IJ SOLN
25.0000 ug | INTRAMUSCULAR | Status: DC | PRN
  Administered 2020-09-12: 50 ug via INTRAVENOUS
  Administered 2020-09-13: 25 ug via INTRAVENOUS
  Administered 2020-09-13: 50 ug via INTRAVENOUS
  Administered 2020-09-13: 25 ug via INTRAVENOUS
  Administered 2020-09-14: 50 ug via INTRAVENOUS
  Filled 2020-09-12 (×5): qty 2

## 2020-09-12 MED ORDER — MONTELUKAST SODIUM 10 MG PO TABS
10.0000 mg | ORAL_TABLET | Freq: Every day | ORAL | Status: DC
  Administered 2020-09-12 – 2020-09-14 (×3): 10 mg via ORAL
  Filled 2020-09-12 (×3): qty 1

## 2020-09-12 MED ORDER — POTASSIUM CHLORIDE IN NACL 20-0.9 MEQ/L-% IV SOLN
INTRAVENOUS | Status: DC
  Filled 2020-09-12: qty 1000

## 2020-09-12 MED ORDER — DULOXETINE HCL 60 MG PO CPEP
60.0000 mg | ORAL_CAPSULE | Freq: Two times a day (BID) | ORAL | Status: DC
  Administered 2020-09-12 – 2020-09-15 (×6): 60 mg via ORAL
  Filled 2020-09-12 (×2): qty 2
  Filled 2020-09-12 (×2): qty 1
  Filled 2020-09-12 (×2): qty 2

## 2020-09-12 MED ORDER — HYDROMORPHONE HCL 1 MG/ML IJ SOLN
1.0000 mg | Freq: Once | INTRAMUSCULAR | Status: AC
  Administered 2020-09-12: 1 mg via INTRAVENOUS
  Filled 2020-09-12: qty 1

## 2020-09-12 MED ORDER — ONDANSETRON HCL 4 MG/2ML IJ SOLN: 4.0000 mg | Freq: Four times a day (QID) | INTRAMUSCULAR | Status: DC | PRN

## 2020-09-12 MED ORDER — QUETIAPINE FUMARATE 50 MG PO TABS
150.0000 mg | ORAL_TABLET | Freq: Every day | ORAL | Status: DC
  Administered 2020-09-12 – 2020-09-14 (×3): 150 mg via ORAL
  Filled 2020-09-12: qty 3
  Filled 2020-09-12 (×2): qty 1

## 2020-09-12 MED ORDER — ACETAMINOPHEN 325 MG PO TABS
650.0000 mg | ORAL_TABLET | Freq: Four times a day (QID) | ORAL | Status: DC | PRN
  Administered 2020-09-13: 650 mg via ORAL
  Filled 2020-09-12: qty 2

## 2020-09-12 MED ORDER — ACETAMINOPHEN 650 MG RE SUPP: 650.0000 mg | Freq: Four times a day (QID) | RECTAL | Status: DC | PRN

## 2020-09-12 MED ORDER — METOPROLOL SUCCINATE ER 25 MG PO TB24
25.0000 mg | ORAL_TABLET | Freq: Every day | ORAL | Status: DC
  Administered 2020-09-13 – 2020-09-15 (×3): 25 mg via ORAL
  Filled 2020-09-12 (×3): qty 1

## 2020-09-12 NOTE — ED Notes (Signed)
Completed IP handoff via secure chat to Debroah Loop, RN. Called [facility] to check on purple man in order to call report to inpatient RN, per [facility], pt was assigned to RN 1 hour and 13 minutes ago. No purple man was evident on trackboard to this RN.

## 2020-09-12 NOTE — ED Notes (Signed)
Pt received on [place] per Debroah Loop, RN

## 2020-09-12 NOTE — H&P (Signed)
History and Physical    Brittany [name] SMO:[id number] DOB: 26-Mar-1966 DOA: 09/12/2020  PCP: [name], FNP   Patient coming from: Home  Chief Complaint: Abdominal pain   HPI: Brittany [name] is a 55 y.o. female with medical history significant for depression, anxiety, chronic idiopathic urticaria, Roux-en-Y in 2015, and recent admission with obstructive jaundice now status post biliary drain placement by IR presenting to the emergency department for increased right upper quadrant abdominal pain.  Patient had recently been experiencing abdominal pain and dark urine, had outpatient work-up notable for gallstones on ultrasound and elevated LFTs, was directed to the ED for evaluation, admitted on 09/07/2020, had intra and extrahepatic biliary ductal dilatation on MRCP with unclear etiology, had percutaneous drain placed by IR on 09/08/2020, was seen by GI, and discharged with plans for outpatient endoscopic ultrasound, and possible cholangiogram and brush biopsy by IR.  She reports that pain was controlled prior to leaving the hospital, did not require any analgesics since then, but then woke early this morning with severe pain at the drain site.  She has not noticed any change in drain output, denies any fevers or chills, and reports nausea without vomiting.   ED Course: Upon arrival to the ED, patient is found to be afebrile, saturating well on room air, and with blood pressure 150/90.  Chemistry panel with elevated but improving alkaline phosphatase, ALT, and bilirubin.  CBC with mild leukocytosis.  Patient was given analgesics in the ED, GI was consulted by the ED physician, and hospitalists asked to admit.  Review of Systems:  All other systems reviewed and apart from HPI, are negative.  Past Medical History:  Diagnosis Date  . Allergy   . Angio-edema   . Anxiety   . Arthritis    hips, knees and hands   . Blood transfusion without reported diagnosis   . Depression   . GERD  (gastroesophageal reflux disease)    hx of   . H/O hiatal hernia   . History of chicken pox   . Hypercholesterolemia   . Hypertension   . Migraine   . Migraines   . Morbid obesity ([place])   . Positive TB test   . PTSD (post-traumatic stress disorder)   . Sleep apnea    no CPAP machine   . Ulcer, stomach peptic   . Urticaria     Past Surgical History:  Procedure Laterality Date  . ABDOMINAL HYSTERECTOMY  1997 & 2006  . BREATH TEK H PYLORI  05/03/2012   Procedure: BREATH TEK H PYLORI;  Surgeon: [name], MD;  Location: [name];  Service: General;  Laterality: N/A;  . COLONOSCOPY     hx of benign polyps   . ESOPHAGOGASTRODUODENOSCOPY N/A 09/29/2017   Procedure: ESOPHAGOGASTRODUODENOSCOPY (EGD);  Surgeon: Ladene Artist, MD;  Location: [name];  Service: Endoscopy;  Laterality: N/A;  . FOOT SURGERY    . GASTRIC BYPASS    . GASTRIC ROUX-EN-Y N/A 07/05/2012   Procedure: LAPAROSCOPIC ROUX-EN-Y GASTRIC;  Surgeon: [name], MD;  Location: [facility];  Service: General;  Laterality: N/A;  . IR BILIARY DRAIN PLACEMENT WITH CHOLANGIOGRAM  09/08/2020  . IRRIGATION AND DEBRIDEMENT ABSCESS N/A 01/01/2014   Procedure: IRRIGATION AND DEBRIDEMENT ABSCESS;  Surgeon: [name], MD;  Location: [facility];  Service: General;  Laterality: N/A;  . LAPAROSCOPIC LYSIS OF ADHESIONS N/A 07/05/2012   Procedure: LAPAROSCOPIC LYSIS OF ADHESIONS;  Surgeon: [name], MD;  Location: [facility];  Service:  General;  Laterality: N/A;  repair of abdominal wall hernia  . right tube and ovary removed     . UPPER GI ENDOSCOPY N/A 07/05/2012   Procedure: UPPER GI ENDOSCOPY;  Surgeon: [name], MD;  Location: [facility];  Service: General;  Laterality: N/A;    Social History:   reports that she quit smoking about 16 years ago. Her smoking use included cigarettes. She has a 0.10 pack-year smoking history. She has never used smokeless tobacco. She reports current alcohol use. She reports that she does  not use drugs.  Allergies  Allergen Reactions  . Contrast Media [Iodinated Diagnostic Agents] Hives  . Amoxicillin Hives    .Marland KitchenHas patient had a PCN reaction causing immediate rash, facial/tongue/throat swelling, SOB or lightheadedness with hypotension: Yes Has patient had a PCN reaction causing severe rash involving mucus membranes or skin necrosis: No Has patient had a PCN reaction that required hospitalization No Has patient had a PCN reaction occurring within the last 10 years: No If all of the above answers are "NO", then may proceed with Cephalosporin use.   . Bactrim [Sulfamethoxazole-Trimethoprim] Hives and Rash  . Lamictal [Lamotrigine] Hives and Rash    Family History  Problem Relation Age of Onset  . Colon cancer Mother 100  . Depression Father   . Diabetes Father   . Heart disease Father   . Bladder Cancer Father   . Depression Sister   . Post-traumatic stress disorder Sister   . Alcohol abuse Paternal Uncle   . Bipolar disorder Cousin   . Anxiety disorder Sister   . Eczema Sister   . Cancer Maternal Aunt        breast  . Breast cancer Maternal Aunt   . Melanoma Maternal Grandmother   . Colon cancer Maternal Grandmother        does not know age of onset  . Healthy Maternal Grandfather   . Healthy Paternal Grandmother   . Heart disease Paternal Grandfather   . Stroke Paternal Grandfather   . Diabetes Paternal Grandfather      Prior to Admission medications   Medication Sig Start Date End Date Taking? Authorizing Provider  calcium-vitamin D (OSCAL WITH D) 500-200 MG-UNIT tablet Take 1 tablet by mouth every evening. For bone health 09/10/20   Samuella Cota, MD  cetirizine (ZYRTEC) 10 MG tablet Take 10 mg by mouth 2 (two) times daily.    [provider]  cholecalciferol (VITAMIN D) 1000 units tablet Take 1 tablet (1,000 Units total) by mouth daily. For bone health 09/17/15   Lindell Spar I, NP  DULoxetine (CYMBALTA) 60 MG capsule Take 1 capsule (60 mg  total) by mouth 2 (two) times daily. 08/29/20   Arfeen, Arlyce Harman, MD  EPINEPHrine (EPIPEN 2-PAK) 0.3 mg/0.3 mL IJ SOAJ injection Inject 0.3 mg into the muscle as needed for anaphylaxis. 07/09/20   Garnet Sierras, DO  famotidine (PEPCID) 20 MG tablet Take 1 tablet (20 mg total) by mouth daily. 09/10/20   Samuella Cota, MD  metoprolol succinate (TOPROL XL) 25 MG 24 hr tablet Take 1 tablet (25 mg total) by mouth daily. 08/31/20   Tobb, Kardie, DO  montelukast (SINGULAIR) 10 MG tablet TAKE 1 TABLET(10 MG) BY MOUTH AT BEDTIME Patient taking differently: Take 10 mg by mouth at bedtime. 03/26/20   Garnet Sierras, DO  Multiple Vitamin (MULTI-VITAMIN) tablet Take 1 tablet by mouth daily.    [provider]  QUEtiapine (SEROQUEL) 50 MG tablet TAKE  3 TABLETS(150 MG) BY MOUTH AT BEDTIME Patient taking differently: Take 150 mg by mouth at bedtime. 08/29/20   Arfeen, Arlyce Harman, MD  vitamin B-12 (CYANOCOBALAMIN) 500 MCG tablet Take 1 tablet (500 mcg total) by mouth daily. For Vitamin B-12 replacement Patient taking differently: Take 500 mcg by mouth daily. 09/17/15   Lindell Spar I, NP  vitamin E 1000 UNIT capsule Take 1,000 Units by mouth daily.    [provider]  XOLAIR 150 MG injection INJECT 300 MG UNDER THE SKIN EVERY 14 DAYS Patient taking differently: Inject 150 mg into the skin every 28 (twenty-eight) days. 06/21/20   Garnet Sierras, DO    Physical Exam: Vitals:   09/12/20 1435 09/12/20 1638 09/12/20 1715 09/12/20 1830  BP: (!) 150/103 (!) 144/93 (!) 157/109 (!) 150/94  Pulse: 86 87 83 88  Resp: 18 16 17 16   Temp: 98.5 F (36.9 C)     TempSrc: Oral     SpO2: 97% 97% 95% 97%    Constitutional: NAD, calm  Eyes: PERTLA, lids and conjunctivae normal ENMT: Mucous membranes are moist. Posterior pharynx clear of any exudate or lesions.   Neck: supple, no masses  Respiratory: no wheezing, no crackles. No accessory muscle use.  Cardiovascular: S1 & S2 heard, regular rate and rhythm. No extremity  edema.   Abdomen: Soft, tender in RUQ, no rebound pain or guarding. Bowel sounds active.  Musculoskeletal: no clubbing / cyanosis. No joint deformity upper and lower extremities.   Skin: Petechiae about trunk and extremities. Warm, dry, well-perfused. Neurologic: CN 2-12 grossly intact. Sensation intact. Moving all extremities.  Psychiatric: Alert and oriented to person, place, and situation. Pleasant and cooperative.    Labs and Imaging on Admission: I have personally reviewed following labs and imaging studies  CBC: Recent Labs  Lab 09/07/20 1155 09/08/20 0349 09/12/20 1510  WBC 5.6 4.4 11.3*  NEUTROABS  --  2.5 9.9*  HGB 12.9 11.4* 13.2  HCT 37.2 33.0* 38.8  MCV 97.4 97.3 97.2  PLT 270 244 650   Basic Metabolic Panel: Recent Labs  Lab 09/07/20 1155 09/08/20 0349 09/09/20 0424 09/12/20 1510  NA 139 139 138 140  K 3.1* 3.5 3.8 3.5  CL 107 105 105 107  CO2 25 25 25 23   GLUCOSE 122* 107* 145* 141*  BUN 6 6 6 11   CREATININE 0.60 0.38* 0.37* 0.50  CALCIUM 9.0 8.6* 9.0 8.9  MG 1.8  --   --   --    GFR: Estimated Creatinine Clearance: 109 mL/min (by C-G formula based on SCr of 0.5 mg/dL). Liver Function Tests: Recent Labs  Lab 09/07/20 1155 09/08/20 0349 09/09/20 0424 09/10/20 0458 09/12/20 1510  AST 269* 262* 144* 63* 29  ALT 464* 403* 390* 254* 167*  ALKPHOS 708* 692* 692* 560* 515*  BILITOT 3.5* 6.5* 2.9* 2.4* 2.2*  PROT 6.6 6.0* 6.3* 5.7* 7.1  ALBUMIN 3.2* 2.8* 3.1* 2.9* 3.5   Recent Labs  Lab 09/07/20 1155 09/12/20 1510  LIPASE 17 17   No results for input(s): AMMONIA in the last 168 hours. Coagulation Profile: Recent Labs  Lab 09/07/20 1845  INR 0.9   Cardiac Enzymes: No results for input(s): CKTOTAL, CKMB, CKMBINDEX, TROPONINI in the last 168 hours. BNP (last 3 results) No results for input(s): PROBNP in the last 8760 hours. HbA1C: No results for input(s): HGBA1C in the last 72 hours. CBG: No results for input(s): GLUCAP in the last 168  hours. Lipid Profile: No results for input(s):  CHOL, HDL, LDLCALC, TRIG, CHOLHDL, LDLDIRECT in the last 72 hours. Thyroid Function Tests: No results for input(s): TSH, T4TOTAL, FREET4, T3FREE, THYROIDAB in the last 72 hours. Anemia Panel: No results for input(s): VITAMINB12, FOLATE, FERRITIN, TIBC, IRON, RETICCTPCT in the last 72 hours. Urine analysis:    Component Value Date/Time   COLORURINE AMBER (A) 09/07/2020 1355   APPEARANCEUR HAZY (A) 09/07/2020 1355   LABSPEC 1.020 09/07/2020 1355   PHURINE 5.0 09/07/2020 1355   GLUCOSEU NEGATIVE 09/07/2020 1355   HGBUR NEGATIVE 09/07/2020 1355   BILIRUBINUR SMALL (A) 09/07/2020 1355   BILIRUBINUR moderate (A) 09/07/2020 0905   KETONESUR 20 (A) 09/07/2020 1355   PROTEINUR 30 (A) 09/07/2020 1355   UROBILINOGEN 4.0 (A) 09/07/2020 0905   NITRITE NEGATIVE 09/07/2020 1355   LEUKOCYTESUR NEGATIVE 09/07/2020 1355   Sepsis Labs: @LABRCNTIP (procalcitonin:4,lacticidven:4) ) Recent Results (from the past 240 hour(s))  Urine Culture     Status: None   Collection Time: 09/07/20  9:25 AM   Specimen: Urine  Result Value Ref Range Status   MICRO NUMBER: [id number]  Final   SPECIMEN QUALITY: Adequate  Final   Sample Source NOT GIVEN  Final   STATUS: FINAL  Final   Result:   Final    Mixed genital flora isolated. These superficial bacteria are not indicative of a urinary tract infection. No further organism identification is warranted on this specimen. If clinically indicated, recollect clean-catch, mid-stream urine and transfer  immediately to Urine Culture Transport Tube.   SARS CORONAVIRUS 2 (TAT 6-24 HRS) Nasopharyngeal Nasopharyngeal Swab     Status: None   Collection Time: 09/07/20  5:28 PM   Specimen: Nasopharyngeal Swab  Result Value Ref Range Status   SARS Coronavirus 2 NEGATIVE NEGATIVE Final    Comment: (NOTE) SARS-CoV-2 target nucleic acids are NOT DETECTED.  The SARS-CoV-2 RNA is generally detectable in upper and lower respiratory  specimens during the acute phase of infection. Negative results do not preclude SARS-CoV-2 infection, do not rule out co-infections with other pathogens, and should not be used as the sole basis for treatment or other patient management decisions. Negative results must be combined with clinical observations, patient history, and epidemiological information. The expected result is Negative.  Fact Sheet for Patients: SugarRoll.be  Fact Sheet for Healthcare Providers: https://www.woods-mathews.com/  This test is not yet approved or cleared by the [place] FDA and  has been authorized for detection and/or diagnosis of SARS-CoV-2 by FDA under an Emergency Use Authorization (EUA). This EUA will remain  in effect (meaning this test can be used) for the duration of the COVID-19 declaration under Se ction 564(b)(1) of the Act, 21 U.S.C. section 360bbb-3(b)(1), unless the authorization is terminated or revoked sooner.  Performed at [facility], [employer] [address], [place], [place] [address]   Resp Panel by RT-PCR (Flu A&B, Covid) Nasopharyngeal Swab     Status: None   Collection Time: 09/12/20  7:39 PM   Specimen: Nasopharyngeal Swab; Nasopharyngeal(NP) swabs in vial transport medium  Result Value Ref Range Status   SARS Coronavirus 2 by RT PCR NEGATIVE NEGATIVE Final    Comment: (NOTE) SARS-CoV-2 target nucleic acids are NOT DETECTED.  The SARS-CoV-2 RNA is generally detectable in upper respiratory specimens during the acute phase of infection. The lowest concentration of SARS-CoV-2 viral copies this assay can detect is 138 copies/mL. A negative result does not preclude SARS-Cov-2 infection and should not be used as the sole basis for treatment or other patient management decisions. A  negative result may occur with  improper specimen collection/handling, submission of specimen other than nasopharyngeal swab, presence of viral mutation(s)  within the areas targeted by this assay, and inadequate number of viral copies(<138 copies/mL). A negative result must be combined with clinical observations, patient history, and epidemiological information. The expected result is Negative.  Fact Sheet for Patients:  EntrepreneurPulse.com.au  Fact Sheet for Healthcare Providers:  IncredibleEmployment.be  This test is no t yet approved or cleared by the [place] FDA and  has been authorized for detection and/or diagnosis of SARS-CoV-2 by FDA under an Emergency Use Authorization (EUA). This EUA will remain  in effect (meaning this test can be used) for the duration of the COVID-19 declaration under Section 564(b)(1) of the Act, 21 U.S.C.section 360bbb-3(b)(1), unless the authorization is terminated  or revoked sooner.       Influenza A by PCR NEGATIVE NEGATIVE Final   Influenza B by PCR NEGATIVE NEGATIVE Final    Comment: (NOTE) The Xpert Xpress SARS-CoV-2/FLU/RSV plus assay is intended as an aid in the diagnosis of influenza from Nasopharyngeal swab specimens and should not be used as a sole basis for treatment. Nasal washings and aspirates are unacceptable for Xpert Xpress SARS-CoV-2/FLU/RSV testing.  Fact Sheet for Patients: EntrepreneurPulse.com.au  Fact Sheet for Healthcare Providers: IncredibleEmployment.be  This test is not yet approved or cleared by the [place] FDA and has been authorized for detection and/or diagnosis of SARS-CoV-2 by FDA under an Emergency Use Authorization (EUA). This EUA will remain in effect (meaning this test can be used) for the duration of the COVID-19 declaration under Section 564(b)(1) of the Act, 21 U.S.C. section 360bbb-3(b)(1), unless the authorization is terminated or revoked.  Performed at [facility], [place] [address] [place], [place] [address]      Radiological Exams on  Admission: No results found.  Assessment/Plan   1. Biliary obstruction; abdominal pain  - Patient was admitted 09/07/20 with obstructive jaundice, had biliary drain placed by IR on 5/28, was discharged on 5/30 with plan for outpatient EUS, cholangiogram, and brush biopsy, but returns today with increased RUQ pain  - Appreciate GI consultants, plan to follow-up on recommendations, continue pain-control and supportive care for now    2. Depression, anxiety  - Continue Duloxetine and Seroquel    3. Hypertension  - Continue metoprolol    4. Chronic urticaria  - Continue Singulair    DVT prophylaxis: SCDs  Code Status: Full  Level of Care: Level of care: Med-Surg Family Communication: None present  Disposition Plan:  Patient is from: Home  Anticipated d/c is to: Home Anticipated d/c date is: Possibly as early as 09/13/20 Patient currently: Pending pain-control, GI consultation  Consults called: GI  Admission status: Observation     Vianne Bulls, MD [facility]  09/12/2020, 8:31 PM

## 2020-09-12 NOTE — ED Triage Notes (Signed)
Pt arrived via walk in, c/o more pain to bilary drain today.

## 2020-09-12 NOTE — ED Provider Notes (Signed)
Emergency Medicine Provider Triage Evaluation Note  Zabdi Janan Halter , a 55 y.o. female  was evaluated in triage.  Pt complains of drain site pain since today..  Had a biliary drain placed about 3 days ago.  Was discharged 2 days ago.  Pain is manageable up until this morning.  She continues to have adequate drainage.  No fevers or vomiting but continues to have diarrhea  Review of Systems  Positive: Abdominal pain Negative: Fever, vomiting  Physical Exam  BP (!) 150/103 (BP Location: Left Arm)   Pulse 86   Temp 98.5 F (36.9 C) (Oral)   Resp 18   SpO2 97%  Gen:   Awake, no distress   Resp:  Normal effort  MSK:   Moves extremities without difficulty  Other:  Biliary drain in place  Medical Decision Making  Medically screening exam initiated at 2:39 PM.  Appropriate orders placed.  Cathlyn Janan Halter was informed that the remainder of the evaluation will be completed by another provider, this initial triage assessment does not replace that evaluation, and the importance of remaining in the ED until their evaluation is complete.  Lab work ordered   Delia Heady, PA-C 09/12/20 1440    Hayden Rasmussen, MD 09/13/20 1332

## 2020-09-12 NOTE — ED Provider Notes (Signed)
[facility] DEPT Provider Note   CSN: [id number] Arrival date & time: 09/12/20  1420     History Chief Complaint  Patient presents with  . Bilary Drain Issue    Brittany [name] is a 55 y.o. female.  She has a history of a Roux-en-Y gastric bypass.  Was admitted last week for obstructive jaundice and had a percutaneous biliary drain placed.  She says her pain in the hospital was like a 1 out of 10.  She was discharged a few days ago.  Starting yesterday and today her pain at the drain site and also her right side abdomen in general has increased and is now 8 out of 10 with any movement and 4 out of 10 at rest.  The drain appears to be functioning well.  No fevers or chills.  Some nausea that is been persistent for about a month.  No vomiting or diarrhea.  No urinary symptoms.  The history is provided by the patient.  Abdominal Pain Pain location:  RUQ Pain quality: aching   Pain severity:  Moderate Onset quality:  Gradual Duration:  2 days Timing:  Constant Progression:  Worsening Chronicity:  New Context: not trauma   Relieved by:  Nothing Worsened by:  Movement Ineffective treatments:  None tried Associated symptoms: nausea   Associated symptoms: no chest pain, no chills, no cough, no diarrhea, no dysuria, no fever, no shortness of breath, no sore throat and no vomiting   Risk factors: recent hospitalization        Past Medical History:  Diagnosis Date  . Allergy   . Angio-edema   . Anxiety   . Arthritis    hips, knees and hands   . Blood transfusion without reported diagnosis   . Depression   . GERD (gastroesophageal reflux disease)    hx of   . H/O hiatal hernia   . History of chicken pox   . Hypercholesterolemia   . Hypertension   . Migraine   . Migraines   . Morbid obesity ([place])   . Positive TB test   . PTSD (post-traumatic stress disorder)   . Sleep apnea    no CPAP machine   . Ulcer, stomach peptic   . Urticaria      Patient Active Problem List   Diagnosis Date Noted  . Obstructive jaundice 09/08/2020  . Elevated LFTs 09/08/2020  . Choledocholithiasis 09/07/2020  . Urticaria   . Ulcer, stomach peptic   . Sleep apnea   . Positive TB test   . Migraines   . Hypercholesterolemia   . History of chicken pox   . H/O hiatal hernia   . Blood transfusion without reported diagnosis   . Arthritis   . Anxiety   . Angio-edema   . Allergy   . Allergic contact dermatitis 07/09/2020  . Chronic idiopathic urticaria 01/17/2019  . Seasonal and perennial allergic rhinoconjunctivitis 11/08/2018  . Allergic reaction 11/08/2018  . Diarrhea 11/08/2018  . Right thigh pain 05/19/2018  . Melena   . Acute gastrojejunal anastomotic ulcer   . Macrocytic anemia   . Acute GI bleeding 09/28/2017  . Epigastric abdominal pain 11/19/2016  . Incisional hernia with obstruction but no gangrene 07/21/2016  . Menopause 04/22/2016  . Routine general medical examination at a health care facility 03/18/2016  . Obesity 03/18/2016  . Severe episode of recurrent major depressive disorder, without psychotic features ([place])   . Depression, major, severe recurrence ([place]) 09/11/2015  . Major depressive  disorder, recurrent, severe without psychotic features ([place])   . MDD (major depressive disorder) 10/30/2014  . MDD (major depressive disorder), recurrent severe, without psychosis ([place])   . Suicidal ideation 10/05/2014  . Depression 06/12/2014  . Rash 01/04/2014  . Normocytic anemia 01/04/2014  . DJD (degenerative joint disease) 01/04/2014  . Hypertension 01/04/2014  . Dyslipidemia 01/04/2014  . GERD (gastroesophageal reflux disease) 01/04/2014  . Obstructive sleep apnea 01/04/2014  . Status post small bowel resection 01/04/2014  . Abdominal abscess 01/01/2014  . Wound infection after surgery 01/01/2014  . PTSD (post-traumatic stress disorder) 11/23/2013  . Severe recurrent major depression without psychotic features ([place])  11/22/2013  . 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 Morbid obesity, Weight - 312, BMI - 45.2 04/21/2012    Past Surgical History:  Procedure Laterality Date  . ABDOMINAL HYSTERECTOMY  1997 & 2006  . BREATH TEK H PYLORI  05/03/2012   Procedure: BREATH TEK H PYLORI;  Surgeon: [name], MD;  Location: [name];  Service: General;  Laterality: N/A;  . COLONOSCOPY     hx of benign polyps   . ESOPHAGOGASTRODUODENOSCOPY N/A 09/29/2017   Procedure: ESOPHAGOGASTRODUODENOSCOPY (EGD);  Surgeon: Ladene Artist, MD;  Location: [name];  Service: Endoscopy;  Laterality: N/A;  . FOOT SURGERY    . GASTRIC BYPASS    . GASTRIC ROUX-EN-Y N/A 07/05/2012   Procedure: LAPAROSCOPIC ROUX-EN-Y GASTRIC;  Surgeon: [name], MD;  Location: [facility];  Service: General;  Laterality: N/A;  . IR BILIARY DRAIN PLACEMENT WITH CHOLANGIOGRAM  09/08/2020  . IRRIGATION AND DEBRIDEMENT ABSCESS N/A 01/01/2014   Procedure: IRRIGATION AND DEBRIDEMENT ABSCESS;  Surgeon: [name], MD;  Location: [facility];  Service: General;  Laterality: N/A;  . LAPAROSCOPIC LYSIS OF ADHESIONS N/A 07/05/2012   Procedure: LAPAROSCOPIC LYSIS OF ADHESIONS;  Surgeon: [name], MD;  Location: [facility];  Service: General;  Laterality: N/A;  repair of abdominal wall hernia  . right tube and ovary removed     . UPPER GI ENDOSCOPY N/A 07/05/2012   Procedure: UPPER GI ENDOSCOPY;  Surgeon: [name], MD;  Location: [facility];  Service: General;  Laterality: N/A;     OB History    Gravida  0   Para  0   Term  0   Preterm  0   AB  0   Living  0     SAB  0   IAB  0   Ectopic  0   Multiple  0   Live Births  0           Family History  Problem Relation Age of Onset  . Colon cancer Mother 75  . Depression Father   . Diabetes Father   . Heart disease Father   . Bladder Cancer Father   . Depression Sister   . Post-traumatic stress disorder Sister   . Alcohol abuse Paternal Uncle    . Bipolar disorder Cousin   . Anxiety disorder Sister   . Eczema Sister   . Cancer Maternal Aunt        breast  . Breast cancer Maternal Aunt   . Melanoma Maternal Grandmother   . Colon cancer Maternal Grandmother        does not know age of onset  . Healthy Maternal Grandfather   . Healthy Paternal Grandmother   . Heart disease Paternal Grandfather   . Stroke Paternal Grandfather   . Diabetes Paternal Grandfather  Social History   Tobacco Use  . Smoking status: Former Smoker    Packs/day: 0.10    Years: 1.00    Pack years: 0.10    Types: Cigarettes    Quit date: 04/27/2004    Years since quitting: 16.3  . Smokeless tobacco: Never Used  Vaping Use  . Vaping Use: Never used  Substance Use Topics  . Alcohol use: Yes    Alcohol/week: 0.0 standard drinks    Comment: Occasional use/ 2 drinks a month  . Drug use: No    Home Medications Prior to Admission medications   Medication Sig Start Date End Date Taking? Authorizing Provider  calcium-vitamin D (OSCAL WITH D) 500-200 MG-UNIT tablet Take 1 tablet by mouth every evening. For bone health 09/10/20   Samuella Cota, MD  cetirizine (ZYRTEC) 10 MG tablet Take 10 mg by mouth 2 (two) times daily.    [provider]  cholecalciferol (VITAMIN D) 1000 units tablet Take 1 tablet (1,000 Units total) by mouth daily. For bone health 09/17/15   Lindell Spar I, NP  DULoxetine (CYMBALTA) 60 MG capsule Take 1 capsule (60 mg total) by mouth 2 (two) times daily. 08/29/20   Arfeen, Arlyce Harman, MD  EPINEPHrine (EPIPEN 2-PAK) 0.3 mg/0.3 mL IJ SOAJ injection Inject 0.3 mg into the muscle as needed for anaphylaxis. 07/09/20   Garnet Sierras, DO  famotidine (PEPCID) 20 MG tablet Take 1 tablet (20 mg total) by mouth daily. 09/10/20   Samuella Cota, MD  metoprolol succinate (TOPROL XL) 25 MG 24 hr tablet Take 1 tablet (25 mg total) by mouth daily. 08/31/20   Tobb, Kardie, DO  montelukast (SINGULAIR) 10 MG tablet TAKE 1 TABLET(10 MG) BY MOUTH  AT BEDTIME Patient taking differently: Take 10 mg by mouth at bedtime. 03/26/20   Garnet Sierras, DO  Multiple Vitamin (MULTI-VITAMIN) tablet Take 1 tablet by mouth daily.    [provider]  QUEtiapine (SEROQUEL) 50 MG tablet TAKE 3 TABLETS(150 MG) BY MOUTH AT BEDTIME Patient taking differently: Take 150 mg by mouth at bedtime. 08/29/20   Arfeen, Arlyce Harman, MD  vitamin B-12 (CYANOCOBALAMIN) 500 MCG tablet Take 1 tablet (500 mcg total) by mouth daily. For Vitamin B-12 replacement Patient taking differently: Take 500 mcg by mouth daily. 09/17/15   Lindell Spar I, NP  vitamin E 1000 UNIT capsule Take 1,000 Units by mouth daily.    [provider]  XOLAIR 150 MG injection INJECT 300 MG UNDER THE SKIN EVERY 14 DAYS Patient taking differently: Inject 150 mg into the skin every 28 (twenty-eight) days. 06/21/20   Garnet Sierras, DO    Allergies    Contrast media [iodinated diagnostic agents], Amoxicillin, Bactrim [sulfamethoxazole-trimethoprim], and Lamictal [lamotrigine]  Review of Systems   Review of Systems  Constitutional: Negative for chills and fever.  HENT: Negative for sore throat.   Eyes: Negative for visual disturbance.  Respiratory: Negative for cough and shortness of breath.   Cardiovascular: Negative for chest pain.  Gastrointestinal: Positive for abdominal pain and nausea. Negative for diarrhea and vomiting.  Genitourinary: Negative for dysuria.  Musculoskeletal: Negative for neck pain.  Skin: Positive for rash.  Neurological: Negative for headaches.    Physical Exam Updated Vital Signs BP (!) 144/93   Pulse 87   Temp 98.5 F (36.9 C) (Oral)   Resp 16   SpO2 97%   Physical Exam Vitals and nursing note reviewed.  Constitutional:      General: She is not  in acute distress.    Appearance: Normal appearance. She is well-developed.  HENT:     Head: Normocephalic and atraumatic.  Eyes:     Conjunctiva/sclera: Conjunctivae normal.  Cardiovascular:     Rate and  Rhythm: Normal rate and regular rhythm.     Heart sounds: No murmur heard.   Pulmonary:     Effort: Pulmonary effort is normal. No respiratory distress.     Breath sounds: Normal breath sounds.  Abdominal:     Palpations: Abdomen is soft.     Tenderness: There is abdominal tenderness. There is no guarding or rebound.     Comments: Biliary drain in place right upper quadrant.  Appears to be draining well a dark green-brown thin fluid  Musculoskeletal:        General: No deformity or signs of injury. Normal range of motion.     Cervical back: Neck supple.  Skin:    General: Skin is warm and dry.  Neurological:     General: No focal deficit present.     Mental Status: She is alert.     ED Results / Procedures / Treatments   Labs (all labs ordered are listed, but only abnormal results are displayed) Labs Reviewed  COMPREHENSIVE METABOLIC PANEL - Abnormal; Notable for the following components:      Result Value   Glucose, Bld 141 (*)    ALT 167 (*)    Alkaline Phosphatase 515 (*)    Total Bilirubin 2.2 (*)    All other components within normal limits  CBC WITH DIFFERENTIAL/PLATELET - Abnormal; Notable for the following components:   WBC 11.3 (*)    Neutro Abs 9.9 (*)    All other components within normal limits  COMPREHENSIVE METABOLIC PANEL - Abnormal; Notable for the following components:   CO2 20 (*)    Glucose, Bld 124 (*)    Total Protein 6.3 (*)    Albumin 2.9 (*)    ALT 125 (*)    Alkaline Phosphatase 421 (*)    Total Bilirubin 2.1 (*)    All other components within normal limits  CBC - Abnormal; Notable for the following components:   WBC 11.0 (*)    MCH 34.1 (*)    All other components within normal limits  RESP PANEL BY RT-PCR (FLU A&B, COVID) ARPGX2  LIPASE, BLOOD  MAGNESIUM    EKG None  Radiology No results found.  Procedures Procedures   Medications Ordered in ED Medications  fentaNYL (SUBLIMAZE) injection 25-50 mcg (50 mcg Intravenous Given  09/12/20 2307)  ondansetron (ZOFRAN) injection 4 mg (has no administration in time range)  metoprolol succinate (TOPROL-XL) 24 hr tablet 25 mg (25 mg Oral Given 09/13/20 1041)  DULoxetine (CYMBALTA) DR capsule 60 mg (60 mg Oral Given 09/13/20 1040)  QUEtiapine (SEROQUEL) tablet 150 mg (150 mg Oral Given 09/12/20 2301)  famotidine (PEPCID) tablet 20 mg (20 mg Oral Given 09/13/20 1041)  montelukast (SINGULAIR) tablet 10 mg (10 mg Oral Given 09/12/20 2301)  acetaminophen (TYLENOL) tablet 650 mg (650 mg Oral Given 09/13/20 0507)    Or  acetaminophen (TYLENOL) suppository 650 mg ( Rectal See Alternative 09/13/20 0507)  hydrocortisone sodium succinate (SOLU-CORTEF) injection 200 mg (has no administration in time range)  diphenhydrAMINE (BENADRYL) capsule 50 mg (has no administration in time range)    Or  diphenhydrAMINE (BENADRYL) injection 50 mg (has no administration in time range)  0.9 %  sodium chloride infusion ( Intravenous New Bag/Given 09/13/20 1200)  HYDROmorphone (DILAUDID)  injection 1 mg (1 mg Intravenous Given 09/12/20 2026)  ketorolac (TORADOL) 30 MG/ML injection 30 mg (30 mg Intravenous Given 09/12/20 2026)    ED Course  I have reviewed the triage vital signs and the nursing notes.  Pertinent labs & imaging results that were available during my care of the patient were reviewed by me and considered in my medical decision making (see chart for details).  Clinical Course as of 09/13/20 0841  Wed Sep 12, 2020  1846 Discussed with Dr. Bryan Lemma from [facility].  He does not think that an ultrasound would be helpful.  He asked me to reach out to interventional radiology to see if they would want to interrogate the device.  He also recommended that the patient probably be admitted to the hospital as she may need an MRCP and she is getting her surgery tomorrow.  I discussed with Dr. Laurence Ferrari from interventional radiology.  He asked me to evaluate the wound site to see if her suture has any traction on it and  he said sometimes the tube going over the rib can cause a periosteal reaction and this is usually responsive to Toradol. [MB]  [id number] Reviewed recommendations of GI with the patient.  She is in agreement for admission, states she is rather uncomfortable and it seems to be getting worse. [MB]  1921 Discussed with Dr. Myna Hidalgo from [facility] hospitalist who will evaluate the patient for admission. [MB]    Clinical Course User Index [MB] Hayden Rasmussen, MD   MDM Rules/Calculators/A&P                         This patient complains of right-sided abdominal pain in the setting of a recent percutaneous biliary drain placement; this involves an extensive number of treatment Options and is a complaint that carries with it a high risk of complications and Morbidity. The differential includes biloma, drain obstruction, drain migration, obstruction, perforation, colitis, appendicitis, diverticulitis  I ordered, reviewed and interpreted labs, which included CBC with mildly elevated white count, normal hemoglobin, chemistries with low bicarb elevated LFTs improved from priors I ordered medication IV pain medication with improvement in her pain Previous records obtained and reviewed in epic including recent admission for obstructive jaundice I consulted Dr. Bryan Lemma GI, Dr. Laurence Ferrari interventional radiology, Dr. Myna Hidalgo [facility] and discussed lab and imaging findings  Critical Interventions: None  After the interventions stated above, I reevaluated the patient and found patient to be hemodynamically stable although still symptomatically in pain.  She is agreeable to admission for further work-up of her condition.   Final Clinical Impression(s) / ED Diagnoses Final diagnoses:  RUQ pain  Obstructive jaundice    Rx / DC Orders ED Discharge Orders    None       Hayden Rasmussen, MD 09/13/20 1331

## 2020-09-12 NOTE — Telephone Encounter (Signed)
Yesi she saw Nevin Bloodgood last.  Dr Ardis Hughs has never seen her, he is only doing the hospital case.

## 2020-09-13 ENCOUNTER — Ambulatory Visit (HOSPITAL_COMMUNITY): Admission: RE | Admit: 2020-09-13 | Payer: 59 | Source: Home / Self Care | Admitting: Gastroenterology

## 2020-09-13 ENCOUNTER — Inpatient Hospital Stay (HOSPITAL_COMMUNITY): Payer: 59

## 2020-09-13 DIAGNOSIS — Z803 Family history of malignant neoplasm of breast: Secondary | ICD-10-CM | POA: Diagnosis not present

## 2020-09-13 DIAGNOSIS — I10 Essential (primary) hypertension: Secondary | ICD-10-CM | POA: Diagnosis present

## 2020-09-13 DIAGNOSIS — Z9884 Bariatric surgery status: Secondary | ICD-10-CM | POA: Diagnosis not present

## 2020-09-13 DIAGNOSIS — K831 Obstruction of bile duct: Secondary | ICD-10-CM | POA: Diagnosis present

## 2020-09-13 DIAGNOSIS — F419 Anxiety disorder, unspecified: Secondary | ICD-10-CM | POA: Diagnosis present

## 2020-09-13 DIAGNOSIS — D72829 Elevated white blood cell count, unspecified: Secondary | ICD-10-CM | POA: Diagnosis present

## 2020-09-13 DIAGNOSIS — Z880 Allergy status to penicillin: Secondary | ICD-10-CM | POA: Diagnosis not present

## 2020-09-13 DIAGNOSIS — Z20822 Contact with and (suspected) exposure to covid-19: Secondary | ICD-10-CM | POA: Diagnosis present

## 2020-09-13 DIAGNOSIS — F32A Depression, unspecified: Secondary | ICD-10-CM | POA: Diagnosis present

## 2020-09-13 DIAGNOSIS — Z882 Allergy status to sulfonamides status: Secondary | ICD-10-CM | POA: Diagnosis not present

## 2020-09-13 DIAGNOSIS — R1031 Right lower quadrant pain: Secondary | ICD-10-CM | POA: Diagnosis present

## 2020-09-13 DIAGNOSIS — Z9689 Presence of other specified functional implants: Secondary | ICD-10-CM | POA: Diagnosis present

## 2020-09-13 DIAGNOSIS — Z90710 Acquired absence of both cervix and uterus: Secondary | ICD-10-CM | POA: Diagnosis not present

## 2020-09-13 DIAGNOSIS — Z808 Family history of malignant neoplasm of other organs or systems: Secondary | ICD-10-CM | POA: Diagnosis not present

## 2020-09-13 DIAGNOSIS — Z91041 Radiographic dye allergy status: Secondary | ICD-10-CM | POA: Diagnosis not present

## 2020-09-13 DIAGNOSIS — E876 Hypokalemia: Secondary | ICD-10-CM | POA: Diagnosis not present

## 2020-09-13 DIAGNOSIS — Z800 Family history of malignant neoplasm of digestive organs: Secondary | ICD-10-CM | POA: Diagnosis not present

## 2020-09-13 DIAGNOSIS — R109 Unspecified abdominal pain: Secondary | ICD-10-CM | POA: Diagnosis present

## 2020-09-13 DIAGNOSIS — L508 Other urticaria: Secondary | ICD-10-CM | POA: Diagnosis present

## 2020-09-13 DIAGNOSIS — Z888 Allergy status to other drugs, medicaments and biological substances status: Secondary | ICD-10-CM | POA: Diagnosis not present

## 2020-09-13 DIAGNOSIS — Z8052 Family history of malignant neoplasm of bladder: Secondary | ICD-10-CM | POA: Diagnosis not present

## 2020-09-13 DIAGNOSIS — F332 Major depressive disorder, recurrent severe without psychotic features: Secondary | ICD-10-CM | POA: Diagnosis not present

## 2020-09-13 DIAGNOSIS — Z833 Family history of diabetes mellitus: Secondary | ICD-10-CM | POA: Diagnosis not present

## 2020-09-13 DIAGNOSIS — Z823 Family history of stroke: Secondary | ICD-10-CM | POA: Diagnosis not present

## 2020-09-13 DIAGNOSIS — Z8249 Family history of ischemic heart disease and other diseases of the circulatory system: Secondary | ICD-10-CM | POA: Diagnosis not present

## 2020-09-13 DIAGNOSIS — Z87891 Personal history of nicotine dependence: Secondary | ICD-10-CM | POA: Diagnosis not present

## 2020-09-13 DIAGNOSIS — E7800 Pure hypercholesterolemia, unspecified: Secondary | ICD-10-CM | POA: Diagnosis present

## 2020-09-13 LAB — CBC
HCT: 38.2 % (ref 36.0–46.0)
Hemoglobin: 13.4 g/dL (ref 12.0–15.0)
MCH: 34.1 pg — ABNORMAL HIGH (ref 26.0–34.0)
MCHC: 35.1 g/dL (ref 30.0–36.0)
MCV: 97.2 fL (ref 80.0–100.0)
Platelets: 267 10*3/uL (ref 150–400)
RBC: 3.93 MIL/uL (ref 3.87–5.11)
RDW: 13.2 % (ref 11.5–15.5)
WBC: 11 10*3/uL — ABNORMAL HIGH (ref 4.0–10.5)
nRBC: 0.2 % (ref 0.0–0.2)

## 2020-09-13 LAB — MITOCHONDRIAL ANTIBODIES: Mitochondrial M2 Ab, IgG: <20 Units (ref 0.0–20.0)

## 2020-09-13 LAB — COMPREHENSIVE METABOLIC PANEL
ALT: 125 U/L — ABNORMAL HIGH (ref 0–44)
AST: 28 U/L (ref 15–41)
Albumin: 2.9 g/dL — ABNORMAL LOW (ref 3.5–5.0)
Alkaline Phosphatase: 421 U/L — ABNORMAL HIGH (ref 38–126)
Anion gap: 11 (ref 5–15)
BUN: 12 mg/dL (ref 6–20)
CO2: 20 mmol/L — ABNORMAL LOW (ref 22–32)
Calcium: 9.2 mg/dL (ref 8.9–10.3)
Chloride: 107 mmol/L (ref 98–111)
Creatinine, Ser: 0.62 mg/dL (ref 0.44–1.00)
GFR, Estimated: >60 mL/min (ref >60)
Glucose, Bld: 124 mg/dL — ABNORMAL HIGH (ref 70–99)
Potassium: 4.4 mmol/L (ref 3.5–5.1)
Sodium: 138 mmol/L (ref 135–145)
Total Bilirubin: 2.1 mg/dL — ABNORMAL HIGH (ref 0.3–1.2)
Total Protein: 6.3 g/dL — ABNORMAL LOW (ref 6.5–8.1)

## 2020-09-13 LAB — MAGNESIUM: Magnesium: 1.8 mg/dL (ref 1.7–2.4)

## 2020-09-13 MED ORDER — DIPHENHYDRAMINE HCL 50 MG/ML IJ SOLN
50.0000 mg | Freq: Once | INTRAMUSCULAR | Status: AC
  Administered 2020-09-13: 50 mg via INTRAVENOUS
  Filled 2020-09-13: qty 1

## 2020-09-13 MED ORDER — SODIUM CHLORIDE 0.9 % IV SOLN: INTRAVENOUS | Status: DC

## 2020-09-13 MED ORDER — DIPHENHYDRAMINE HCL 25 MG PO CAPS: 50.0000 mg | ORAL_CAPSULE | Freq: Once | ORAL | Status: AC

## 2020-09-13 MED ORDER — IOHEXOL 300 MG/ML  SOLN
100.0000 mL | Freq: Once | INTRAMUSCULAR | Status: AC | PRN
  Administered 2020-09-13: 100 mL via INTRAVENOUS

## 2020-09-13 MED ORDER — SODIUM CHLORIDE (PF) 0.9 % IJ SOLN
INTRAMUSCULAR | Status: AC
  Filled 2020-09-13: qty 50

## 2020-09-13 MED ORDER — HYDROCORTISONE NA SUCCINATE PF 250 MG IJ SOLR
200.0000 mg | Freq: Once | INTRAMUSCULAR | Status: AC
  Administered 2020-09-13: 200 mg via INTRAVENOUS
  Filled 2020-09-13 (×2): qty 200

## 2020-09-13 NOTE — Progress Notes (Signed)
Referring Physician(s): Zehr, Laban Emperor (GI)  Supervising Physician: Markus Daft  Patient Status:  [facility] - In-pt  Chief Complaint:  History of obstructive jaundice s/p internal/external biliary drain placement in IR 09/08/2020; admitted to [facility] 09/12/2020 secondary to abdominal pain, IR requested by GI to evaluate biliary drain/abdominal pain.  Subjective:  Patient awake and alert laying in bed. Daughter at bedside. States yesterday she developed intense RLQ pain (rated 8-9/10 at that time) with associated N, no V. States pain currently 1-2/10 as she is laying still, however any movement increases pain to 8-9/10. States she also has tenderness of biliary drain insertion site (as expected as was placed a few days ago)- states this pain has not changed much. States biliary drain has had significant output since placement- no change to this since placement. Internal/external biliary drain site c/d/i. Tbili 2.1 today.   Allergies: Contrast media [iodinated diagnostic agents], Amoxicillin, Bactrim [sulfamethoxazole-trimethoprim], and Lamictal [lamotrigine]  Medications: Prior to Admission medications   Medication Sig Start Date End Date Taking? Authorizing Provider  calcium-vitamin D (OSCAL WITH D) 500-200 MG-UNIT tablet Take 1 tablet by mouth every evening. For bone health 09/10/20  Yes Samuella Cota, MD  cetirizine (ZYRTEC) 10 MG tablet Take 10 mg by mouth 2 (two) times daily.   Yes [provider]  cholecalciferol (VITAMIN D) 1000 units tablet Take 1 tablet (1,000 Units total) by mouth daily. For bone health 09/17/15  Yes Lindell Spar I, NP  DULoxetine (CYMBALTA) 60 MG capsule Take 1 capsule (60 mg total) by mouth 2 (two) times daily. 08/29/20  Yes Arfeen, Arlyce Harman, MD  EPINEPHrine (EPIPEN 2-PAK) 0.3 mg/0.3 mL IJ SOAJ injection Inject 0.3 mg into the muscle as needed for anaphylaxis. 07/09/20  Yes Garnet Sierras, DO  famotidine (PEPCID) 20 MG tablet Take 1 tablet (20 mg total) by  mouth daily. 09/10/20  Yes Samuella Cota, MD  metoprolol succinate (TOPROL XL) 25 MG 24 hr tablet Take 1 tablet (25 mg total) by mouth daily. 08/31/20  Yes Tobb, Kardie, DO  montelukast (SINGULAIR) 10 MG tablet TAKE 1 TABLET(10 MG) BY MOUTH AT BEDTIME Patient taking differently: Take 10 mg by mouth at bedtime. 03/26/20  Yes Garnet Sierras, DO  Multiple Vitamin (MULTI-VITAMIN) tablet Take 1 tablet by mouth daily.   Yes [provider]  QUEtiapine (SEROQUEL) 50 MG tablet TAKE 3 TABLETS(150 MG) BY MOUTH AT BEDTIME Patient taking differently: Take 150 mg by mouth at bedtime. 08/29/20  Yes Arfeen, Arlyce Harman, MD  vitamin B-12 (CYANOCOBALAMIN) 500 MCG tablet Take 1 tablet (500 mcg total) by mouth daily. For Vitamin B-12 replacement Patient taking differently: Take 500 mcg by mouth daily. 09/17/15  Yes Lindell Spar I, NP  vitamin E 1000 UNIT capsule Take 1,000 Units by mouth daily.   Yes [provider]  XOLAIR 150 MG injection INJECT 300 MG UNDER THE SKIN EVERY 14 DAYS Patient taking differently: Inject 150 mg into the skin every 28 (twenty-eight) days. 06/21/20  Yes Garnet Sierras, DO     Vital Signs: BP 124/75 (BP Location: Right Arm)   Pulse 84   Temp 99.4 F (37.4 C) (Oral)   Resp 18   SpO2 91%   Physical Exam Vitals and nursing note reviewed.  Constitutional:      General: She is not in acute distress.    Appearance: She is obese.  Pulmonary:     Effort: Pulmonary effort is normal. No respiratory distress.  Abdominal:  Palpations: Abdomen is soft.     Comments: Patient with RLQ > LLQ tenderness to palpation, guarding of RLQ. Internal/external biliary drain site with mild tenderness, no erythema, drainage, or active bleeding noted; approximately 100 cc dark bile in gravity bag.  Skin:    General: Skin is warm and dry.  Neurological:     Mental Status: She is alert and oriented to person, place, and time.     Imaging: No results found.  Labs:  CBC: Recent Labs     09/07/20 1155 09/08/20 0349 09/12/20 1510 09/13/20 0442  WBC 5.6 4.4 11.3* 11.0*  HGB 12.9 11.4* 13.2 13.4  HCT 37.2 33.0* 38.8 38.2  PLT 270 244 341 267    COAGS: Recent Labs    09/07/20 1845  INR 0.9    BMP: Recent Labs    09/08/20 0349 09/09/20 0424 09/12/20 1510 09/13/20 0442  NA 139 138 140 138  K 3.5 3.8 3.5 4.4  CL 105 105 107 107  CO2 25 25 23  20*  GLUCOSE 107* 145* 141* 124*  BUN 6 6 11 12   CALCIUM 8.6* 9.0 8.9 9.2  CREATININE 0.38* 0.37* 0.50 0.62  GFRNONAA >60 >60 >60 >60    LIVER FUNCTION TESTS: Recent Labs    09/09/20 0424 09/10/20 0458 09/12/20 1510 09/13/20 0442  BILITOT 2.9* 2.4* 2.2* 2.1*  AST 144* 63* 29 28  ALT 390* 254* 167* 125*  ALKPHOS 692* 560* 515* 421*  PROT 6.3* 5.7* 7.1 6.3*  ALBUMIN 3.1* 2.9* 3.5 2.9*    Assessment and Plan:  History of obstructive jaundice s/p internal/external biliary drain placement in IR 09/08/2020; admitted to [facility] 09/12/2020 secondary to abdominal pain, IR requested by GI to evaluate biliary drain/abdominal pain. Internal/external biliary drain stable with approximately 100 cc dark bile in gravity bag (additional 750 cc output from drain in past 24 hours per chart). Tbili 2.1 today (down from 2.2 yesterday; down from 6.5 pre-procedure). On exam, patient's pain mainly RLQ with some LLQ pain, only pain in RUQ is located at drain insertion site (as expected). Discussed case with Dr. Anselm Pancoast (IR) who recommends CT abdomen/pelvis with contrast for further evaluation of abdominal pain- pain less likely from biliary drain based on exam/drain function/labs. Discussed above with Janett Billow, PA-C (GI) via telephone. IR to follow peripherally- please call IR with questions/concerns.   Electronically Signed: Earley Abide, PA-C 09/13/2020, 10:16 AM   I spent a total of 15 Minutes at the the patient's bedside AND on the patient's hospital floor or unit, greater than 50% of which was counseling/coordinating care for  obstructive jaundice s/p internal/external biliary drain placement.

## 2020-09-13 NOTE — Progress Notes (Signed)
Patient ID: Brittany [name], female   DOB: Apr 24, 1965, 55 y.o.   MRN: [id number]  PROGRESS NOTE    Mahnoor ELTA ANGELL  HQI:[id number] DOB: 04-18-1965 DOA: 09/12/2020 PCP: [name], FNP   Brief Narrative:  55 y.o. female with medical history significant for depression, anxiety, chronic idiopathic urticaria, Roux-en-Y in 2015, and recent admission with obstructive jaundice status post biliary drain placement by IR on 09/08/2020 with plans for endoscopy ultrasound to be done as an outpatient by GI presented with worsening abdominal pain with nausea.  On presentation, she had mild leukocytosis; LFTs were elevated but improving compared to her recent discharge.  GI was consulted.  Assessment & Plan:   Severe abdominal pain in a patient with recent diagnosis of obstructive jaundice with distal bile duct stricture status post biliary drain placement by IR on 09/08/2020 Elevated LFTs -Presented with severe abdominal pain.  LFTs have improved compared to recent discharge LFTs. -Awaiting GI evaluation and recommendations.  GI was planning for outpatient EUS.  Might need IR evaluation as well -Continue pain management.  Repeat a.m. LFTs. -Afebrile with only very minimal leukocytosis: No features of cholangitis at this time.  Will avoid antibiotics.  Mild leukocytosis -Monitor  Depression/anxiety -Continue duloxetine and Seroquel  Hypertension -Continue metoprolol  Chronic urticaria -Continue Singulair  DVT prophylaxis: SCDs Code Status: Full Family Communication: None at bedside Disposition Plan: Status is: Observation  The patient will require care spanning > 2 midnights and should be moved to inpatient because: Inpatient level of care appropriate due to severity of illness  Dispo: The patient is from: Home              Anticipated d/c is to: Home              Patient currently is not medically stable to d/c.   Difficult to place patient No   Consultants: GI  Procedures:  None  Antimicrobials: None   Subjective: Patient seen and examined at bedside.  Still complains of abdominal pain, mostly in the lower abdomen.  Intermittently nauseous but no vomiting.  No overnight fever reported.  Objective: Vitals:   09/12/20 1830 09/12/20 2147 09/13/20 0153 09/13/20 0628  BP: (!) 150/94 (!) 161/89 132/87 124/75  Pulse: 88 91 80 84  Resp: 16 18 18 18   Temp:  99.4 F (37.4 C) 98 F (36.7 C) 99.4 F (37.4 C)  TempSrc:  Oral Oral Oral  SpO2: 97% 90% 93% 91%    Intake/Output Summary (Last 24 hours) at 09/13/2020 1052 Last data filed at 09/13/2020 0653 Gross per 24 hour  Intake 193.01 ml  Output 1950 ml  Net -1756.99 ml   There were no vitals filed for this visit.  Examination:  General exam: Appears calm and comfortable.  Currently on room air Respiratory system: Bilateral decreased breath sounds at bases Cardiovascular system: S1 & S2 heard, Rate controlled Gastrointestinal system: Abdomen is obese, nondistended, soft and mildly tender in the right upper quadrant around the drain site and in right lower quadrant. Normal bowel sounds heard.  Percutaneous drain present with biliary drainage Extremities: No cyanosis, clubbing, edema  Central nervous system: Alert and oriented. No focal neurological deficits. Moving extremities Skin: No rashes, lesions or ulcers Psychiatry: Looks intermittently anxious    Data Reviewed: I have personally reviewed following labs and imaging studies  CBC: Recent Labs  Lab 09/07/20 1155 09/08/20 0349 09/12/20 1510 09/13/20 0442  WBC 5.6 4.4 11.3* 11.0*  NEUTROABS  --  2.5 9.9*  --  HGB 12.9 11.4* 13.2 13.4  HCT 37.2 33.0* 38.8 38.2  MCV 97.4 97.3 97.2 97.2  PLT 270 244 341 010   Basic Metabolic Panel: Recent Labs  Lab 09/07/20 1155 09/08/20 0349 09/09/20 0424 09/12/20 1510 09/13/20 0442  NA 139 139 138 140 138  K 3.1* 3.5 3.8 3.5 4.4  CL 107 105 105 107 107  CO2 25 25 25 23  20*  GLUCOSE 122* 107* 145*  141* 124*  BUN 6 6 6 11 12   CREATININE 0.60 0.38* 0.37* 0.50 0.62  CALCIUM 9.0 8.6* 9.0 8.9 9.2  MG 1.8  --   --   --  1.8   GFR: Estimated Creatinine Clearance: 109 mL/min (by C-G formula based on SCr of 0.62 mg/dL). Liver Function Tests: Recent Labs  Lab 09/08/20 0349 09/09/20 0424 09/10/20 0458 09/12/20 1510 09/13/20 0442  AST 262* 144* 63* 29 28  ALT 403* 390* 254* 167* 125*  ALKPHOS 692* 692* 560* 515* 421*  BILITOT 6.5* 2.9* 2.4* 2.2* 2.1*  PROT 6.0* 6.3* 5.7* 7.1 6.3*  ALBUMIN 2.8* 3.1* 2.9* 3.5 2.9*   Recent Labs  Lab 09/07/20 1155 09/12/20 1510  LIPASE 17 17   No results for input(s): AMMONIA in the last 168 hours. Coagulation Profile: Recent Labs  Lab 09/07/20 1845  INR 0.9   Cardiac Enzymes: No results for input(s): CKTOTAL, CKMB, CKMBINDEX, TROPONINI in the last 168 hours. BNP (last 3 results) No results for input(s): PROBNP in the last 8760 hours. HbA1C: No results for input(s): HGBA1C in the last 72 hours. CBG: No results for input(s): GLUCAP in the last 168 hours. Lipid Profile: No results for input(s): CHOL, HDL, LDLCALC, TRIG, CHOLHDL, LDLDIRECT in the last 72 hours. Thyroid Function Tests: No results for input(s): TSH, T4TOTAL, FREET4, T3FREE, THYROIDAB in the last 72 hours. Anemia Panel: No results for input(s): VITAMINB12, FOLATE, FERRITIN, TIBC, IRON, RETICCTPCT in the last 72 hours. Sepsis Labs: No results for input(s): PROCALCITON, LATICACIDVEN in the last 168 hours.  Recent Results (from the past 240 hour(s))  Urine Culture     Status: None   Collection Time: 09/07/20  9:25 AM   Specimen: Urine  Result Value Ref Range Status   MICRO NUMBER: [id number]  Final   SPECIMEN QUALITY: Adequate  Final   Sample Source NOT GIVEN  Final   STATUS: FINAL  Final   Result:   Final    Mixed genital flora isolated. These superficial bacteria are not indicative of a urinary tract infection. No further organism identification is warranted on this  specimen. If clinically indicated, recollect clean-catch, mid-stream urine and transfer  immediately to Urine Culture Transport Tube.   SARS CORONAVIRUS 2 (TAT 6-24 HRS) Nasopharyngeal Nasopharyngeal Swab     Status: None   Collection Time: 09/07/20  5:28 PM   Specimen: Nasopharyngeal Swab  Result Value Ref Range Status   SARS Coronavirus 2 NEGATIVE NEGATIVE Final    Comment: (NOTE) SARS-CoV-2 target nucleic acids are NOT DETECTED.  The SARS-CoV-2 RNA is generally detectable in upper and lower respiratory specimens during the acute phase of infection. Negative results do not preclude SARS-CoV-2 infection, do not rule out co-infections with other pathogens, and should not be used as the sole basis for treatment or other patient management decisions. Negative results must be combined with clinical observations, patient history, and epidemiological information. The expected result is Negative.  Fact Sheet for Patients: SugarRoll.be  Fact Sheet for Healthcare Providers: https://www.woods-mathews.com/  This test is not yet approved or  cleared by the [place] and  has been authorized for detection and/or diagnosis of SARS-CoV-2 by FDA under an Emergency Use Authorization (EUA). This EUA will remain  in effect (meaning this test can be used) for the duration of the COVID-19 declaration under Se ction 564(b)(1) of the Act, 21 U.S.C. section 360bbb-3(b)(1), unless the authorization is terminated or revoked sooner.  Performed at [facility], [place] [address], [place], [place] [address]   Resp Panel by RT-PCR (Flu A&B, Covid) Nasopharyngeal Swab     Status: None   Collection Time: 09/12/20  7:39 PM   Specimen: Nasopharyngeal Swab; Nasopharyngeal(NP) swabs in vial transport medium  Result Value Ref Range Status   SARS Coronavirus 2 by RT PCR NEGATIVE NEGATIVE Final    Comment: (NOTE) SARS-CoV-2 target nucleic acids are NOT  DETECTED.  The SARS-CoV-2 RNA is generally detectable in upper respiratory specimens during the acute phase of infection. The lowest concentration of SARS-CoV-2 viral copies this assay can detect is 138 copies/mL. A negative result does not preclude SARS-Cov-2 infection and should not be used as the sole basis for treatment or other patient management decisions. A negative result may occur with  improper specimen collection/handling, submission of specimen other than nasopharyngeal swab, presence of viral mutation(s) within the areas targeted by this assay, and inadequate number of viral copies(<138 copies/mL). A negative result must be combined with clinical observations, patient history, and epidemiological information. The expected result is Negative.  Fact Sheet for Patients:  EntrepreneurPulse.com.au  Fact Sheet for Healthcare Providers:  IncredibleEmployment.be  This test is no t yet approved or cleared by the [place] FDA and  has been authorized for detection and/or diagnosis of SARS-CoV-2 by FDA under an Emergency Use Authorization (EUA). This EUA will remain  in effect (meaning this test can be used) for the duration of the COVID-19 declaration under Section 564(b)(1) of the Act, 21 U.S.C.section 360bbb-3(b)(1), unless the authorization is terminated  or revoked sooner.       Influenza A by PCR NEGATIVE NEGATIVE Final   Influenza B by PCR NEGATIVE NEGATIVE Final    Comment: (NOTE) The Xpert Xpress SARS-CoV-2/FLU/RSV plus assay is intended as an aid in the diagnosis of influenza from Nasopharyngeal swab specimens and should not be used as a sole basis for treatment. Nasal washings and aspirates are unacceptable for Xpert Xpress SARS-CoV-2/FLU/RSV testing.  Fact Sheet for Patients: EntrepreneurPulse.com.au  Fact Sheet for Healthcare Providers: IncredibleEmployment.be  This test is not yet  approved or cleared by the [place] FDA and has been authorized for detection and/or diagnosis of SARS-CoV-2 by FDA under an Emergency Use Authorization (EUA). This EUA will remain in effect (meaning this test can be used) for the duration of the COVID-19 declaration under Section 564(b)(1) of the Act, 21 U.S.C. section 360bbb-3(b)(1), unless the authorization is terminated or revoked.  Performed at [facility], [place] [address], [place], [place] [address]          Radiology Studies: No results found.      Scheduled Meds: . diphenhydrAMINE  50 mg Oral Once   Or  . diphenhydrAMINE  50 mg Intravenous Once  . DULoxetine  60 mg Oral BID  . famotidine  20 mg Oral Daily  . hydrocortisone sod succinate (SOLU-CORTEF) inj  200 mg Intravenous Once  . metoprolol succinate  25 mg Oral Daily  . montelukast  10 mg Oral QHS  . QUEtiapine  150 mg Oral QHS   Continuous  Infusions: . 0.9 % NaCl with KCl 20 mEq / L 100 mL/hr at 09/13/20 0258          Aline August, MD [facility] 09/13/2020, 10:52 AM

## 2020-09-13 NOTE — Consult Note (Addendum)
Referring Provider: [facility] Primary Care Physician:  [name], [place] Primary Gastroenterologist:  Dr. Fuller Plan  Reason for Consultation:  Bile duct stricture; right sided abdominal pain  HPI: Brittany [name] is a 55 y.o. female with past medical history listed below including status post Roux-en-Y in 2015 who was seen by our service late last month for elevated LFTs, jaundice, dilated common bile duct.  She underwent PTC with internal/external biliary drain on 5/28 and plan was for outpatient EUS with Dr. Ardis Hughs.  That was actually scheduled for today, but she presented to [facility] overnight with complaints of abdominal pain.  Pain is in the RLQ.  Began suddenly, waking her at 5 AM yesterday morning.  Had some nausea and chills as well.  LFTs are stable/improving.  Most recent GI history: 09/29/2017 EGD Dr. Fuller Plan: - LA Grade B reflux esophagitis. 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 - Roux-en-Y gastrojejunostomy with gastrojejunal anastomosis characterized by two clean based ulcerations. - Small hiatal hernia. - Normal examined jejunum. - No specimens collected.  06/05/2016 Colonoscopy Dr. Fuller Plan: - The perianal and digital rectal examinations were normal. - Internal hemorrhoids were found during retroflexion. The hemorrhoids were small and Grade I (internal hemorrhoids that do not prolapse). - Many small-mouthed diverticula were found in the left colon. There was no evidence of diverticular bleeding. - The exam was otherwise without abnormality on direct and retroflexion views   Past Medical History:  Diagnosis Date  . Allergy   . Angio-edema   . Anxiety   . Arthritis    hips, knees and hands   . Blood transfusion without reported diagnosis   . Depression   . GERD (gastroesophageal reflux disease)    hx of   . H/O hiatal hernia   . History of chicken pox   . Hypercholesterolemia   . Hypertension   . Migraine   . Migraines   . Morbid obesity ([place])   .  Positive TB test   . PTSD (post-traumatic stress disorder)   . Sleep apnea    no CPAP machine   . Ulcer, stomach peptic   . Urticaria     Past Surgical History:  Procedure Laterality Date  . ABDOMINAL HYSTERECTOMY  1997 & 2006  . BREATH TEK H PYLORI  05/03/2012   Procedure: BREATH TEK H PYLORI;  Surgeon: [name], MD;  Location: [name];  Service: General;  Laterality: N/A;  . COLONOSCOPY     hx of benign polyps   . ESOPHAGOGASTRODUODENOSCOPY N/A 09/29/2017   Procedure: ESOPHAGOGASTRODUODENOSCOPY (EGD);  Surgeon: Ladene Artist, MD;  Location: [name];  Service: Endoscopy;  Laterality: N/A;  . FOOT SURGERY    . GASTRIC BYPASS    . GASTRIC ROUX-EN-Y N/A 07/05/2012   Procedure: LAPAROSCOPIC ROUX-EN-Y GASTRIC;  Surgeon: [name], MD;  Location: [facility];  Service: General;  Laterality: N/A;  . IR BILIARY DRAIN PLACEMENT WITH CHOLANGIOGRAM  09/08/2020  . IRRIGATION AND DEBRIDEMENT ABSCESS N/A 01/01/2014   Procedure: IRRIGATION AND DEBRIDEMENT ABSCESS;  Surgeon: [name], MD;  Location: [facility];  Service: General;  Laterality: N/A;  . LAPAROSCOPIC LYSIS OF ADHESIONS N/A 07/05/2012   Procedure: LAPAROSCOPIC LYSIS OF ADHESIONS;  Surgeon: [name], MD;  Location: [facility];  Service: General;  Laterality: N/A;  repair of abdominal wall hernia  . right tube and ovary removed     . UPPER GI ENDOSCOPY N/A 07/05/2012   Procedure: UPPER GI ENDOSCOPY;  Surgeon: [name],  MD;  Location: [facility];  Service: General;  Laterality: N/A;    Prior to Admission medications   Medication Sig Start Date End Date Taking? Authorizing Provider  calcium-vitamin D (OSCAL WITH D) 500-200 MG-UNIT tablet Take 1 tablet by mouth every evening. For bone health 09/10/20  Yes Samuella Cota, MD  cetirizine (ZYRTEC) 10 MG tablet Take 10 mg by mouth 2 (two) times daily.   Yes [provider]  cholecalciferol (VITAMIN D) 1000 units tablet Take 1 tablet (1,000 Units total) by mouth  daily. For bone health 09/17/15  Yes Lindell Spar I, NP  DULoxetine (CYMBALTA) 60 MG capsule Take 1 capsule (60 mg total) by mouth 2 (two) times daily. 08/29/20  Yes Arfeen, Arlyce Harman, MD  EPINEPHrine (EPIPEN 2-PAK) 0.3 mg/0.3 mL IJ SOAJ injection Inject 0.3 mg into the muscle as needed for anaphylaxis. 07/09/20  Yes Garnet Sierras, DO  famotidine (PEPCID) 20 MG tablet Take 1 tablet (20 mg total) by mouth daily. 09/10/20  Yes Samuella Cota, MD  metoprolol succinate (TOPROL XL) 25 MG 24 hr tablet Take 1 tablet (25 mg total) by mouth daily. 08/31/20  Yes Tobb, Kardie, DO  montelukast (SINGULAIR) 10 MG tablet TAKE 1 TABLET(10 MG) BY MOUTH AT BEDTIME Patient taking differently: Take 10 mg by mouth at bedtime. 03/26/20  Yes Garnet Sierras, DO  Multiple Vitamin (MULTI-VITAMIN) tablet Take 1 tablet by mouth daily.   Yes [provider]  QUEtiapine (SEROQUEL) 50 MG tablet TAKE 3 TABLETS(150 MG) BY MOUTH AT BEDTIME Patient taking differently: Take 150 mg by mouth at bedtime. 08/29/20  Yes Arfeen, Arlyce Harman, MD  vitamin B-12 (CYANOCOBALAMIN) 500 MCG tablet Take 1 tablet (500 mcg total) by mouth daily. For Vitamin B-12 replacement Patient taking differently: Take 500 mcg by mouth daily. 09/17/15  Yes Lindell Spar I, NP  vitamin E 1000 UNIT capsule Take 1,000 Units by mouth daily.   Yes [provider]  XOLAIR 150 MG injection INJECT 300 MG UNDER THE SKIN EVERY 14 DAYS Patient taking differently: Inject 150 mg into the skin every 28 (twenty-eight) days. 06/21/20  Yes Garnet Sierras, DO    Current Facility-Administered Medications  Medication Dose Route Frequency Provider Last Rate Last Admin  . 0.9 % NaCl with KCl 20 mEq/ L  infusion   Intravenous Continuous Opyd, Ilene Qua, MD 100 mL/hr at 09/13/20 0653 Infusion Verify at 09/13/20 0653  . acetaminophen (TYLENOL) tablet 650 mg  650 mg Oral Q6H PRN Opyd, Ilene Qua, MD   650 mg at 09/13/20 0507   Or  . acetaminophen (TYLENOL) suppository 650 mg  650 mg Rectal  Q6H PRN Opyd, Ilene Qua, MD      . DULoxetine (CYMBALTA) DR capsule 60 mg  60 mg Oral BID Vianne Bulls, MD   60 mg at 09/12/20 2301  . famotidine (PEPCID) tablet 20 mg  20 mg Oral Daily Opyd, Ilene Qua, MD      . fentaNYL (SUBLIMAZE) injection 25-50 mcg  25-50 mcg Intravenous Q2H PRN Opyd, Ilene Qua, MD   50 mcg at 09/12/20 2307  . metoprolol succinate (TOPROL-XL) 24 hr tablet 25 mg  25 mg Oral Daily Opyd, Ilene Qua, MD      . montelukast (SINGULAIR) tablet 10 mg  10 mg Oral QHS Opyd, Ilene Qua, MD   10 mg at 09/12/20 2301  . ondansetron (ZOFRAN) injection 4 mg  4 mg Intravenous Q6H PRN Opyd, Ilene Qua, MD      .  QUEtiapine (SEROQUEL) tablet 150 mg  150 mg Oral QHS Opyd, Ilene Qua, MD   150 mg at 09/12/20 2301    Allergies as of 09/12/2020 - Review Complete 09/12/2020  Allergen Reaction Noted  . Contrast media [iodinated diagnostic agents] Hives 09/28/2017  . Amoxicillin Hives 10/20/2010  . Bactrim [sulfamethoxazole-trimethoprim] Hives and Rash 01/09/2014  . Lamictal [lamotrigine] Hives and Rash 08/31/2014    Family History  Problem Relation Age of Onset  . Colon cancer Mother 81  . Depression Father   . Diabetes Father   . Heart disease Father   . Bladder Cancer Father   . Depression Sister   . Post-traumatic stress disorder Sister   . Alcohol abuse Paternal Uncle   . Bipolar disorder Cousin   . Anxiety disorder Sister   . Eczema Sister   . Cancer Maternal Aunt        breast  . Breast cancer Maternal Aunt   . Melanoma Maternal Grandmother   . Colon cancer Maternal Grandmother        does not know age of onset  . Healthy Maternal Grandfather   . Healthy Paternal Grandmother   . Heart disease Paternal Grandfather   . Stroke Paternal Grandfather   . Diabetes Paternal Grandfather     Social History   Socioeconomic History  . Marital status: Divorced    Spouse name: Not on file  . Number of children: 0  . Years of education: 55  . Highest education level: Not on file   Occupational History  . Occupation: Software engineer   Tobacco Use  . Smoking status: Former Smoker    Packs/day: 0.10    Years: 1.00    Pack years: 0.10    Types: Cigarettes    Quit date: 04/27/2004    Years since quitting: 16.3  . Smokeless tobacco: Never Used  Vaping Use  . Vaping Use: Never used  Substance and Sexual Activity  . Alcohol use: Yes    Alcohol/week: 0.0 standard drinks    Comment: Occasional use/ 2 drinks a month  . Drug use: No  . Sexual activity: Yes    Birth control/protection: Surgical    Comment: 1st intercourse 55 yo-More than 5 partners  Other Topics Concern  . Not on file  Social History Narrative   Fun: Color and walk, house renovations   Denies abuse and feels safe at home.    Social Determinants of Health   Financial Resource Strain: Not on file  Food Insecurity: Not on file  Transportation Needs: Not on file  Physical Activity: Not on file  Stress: Not on file  Social Connections: Not on file  Intimate Partner Violence: Not on file   Review of Systems: ROS is O/W negative except as mentioned in HPI.  Physical Exam: Vital signs in last 24 hours: Temp:  [98 F (36.7 C)-99.4 F (37.4 C)] 99.4 F (37.4 C) (06/02 0628) Pulse Rate:  [80-91] 84 (06/02 0628) Resp:  [16-18] 18 (06/02 0628) BP: (124-161)/(75-109) 124/75 (06/02 0628) SpO2:  [90 %-97 %] 91 % (06/02 0628) Last BM Date: 09/11/20 General:  Alert, Well-developed, well-nourished, pleasant and cooperative in NAD Head:  Normocephalic and atraumatic. Eyes:  Sclera clear, no icterus.  Conjunctiva pink. Ears:  Normal auditory acuity. Mouth:  No deformity or lesions.   Lungs:  Clear throughout to auscultation.  No wheezes, crackles, or rhonchi.  Heart:  Regular rate and rhythm; no murmurs, clicks, rubs, or gallops. Abdomen:  Soft, non-distended.  BS present.  Moderate RLQ TTP with some guarding.  Biliary drain noted with dark bile. Msk:  Symmetrical without gross  deformities. Pulses:  Normal pulses noted. Extremities:  Without clubbing or edema. Neurologic:  Alert and oriented x 4;  grossly normal neurologically. Skin:  Intact without significant lesions or rashes. Psych:  Alert and cooperative. Normal mood and affect.  Intake/Output from previous day: 06/01 0701 - 06/02 0700 In: 193 [P.O.:60; I.V.:133] Out: 1950 [Urine:1200; Drains:750]  Lab Results: Recent Labs    09/12/20 1510 09/13/20 0442  WBC 11.3* 11.0*  HGB 13.2 13.4  HCT 38.8 38.2  PLT 341 267   BMET Recent Labs    09/12/20 1510 09/13/20 0442  NA 140 138  K 3.5 4.4  CL 107 107  CO2 23 20*  GLUCOSE 141* 124*  BUN 11 12  CREATININE 0.50 0.62  CALCIUM 8.9 9.2   LFT Recent Labs    09/13/20 0442  PROT 6.3*  ALBUMIN 2.9*  AST 28  ALT 125*  ALKPHOS 421*  BILITOT 2.1*   IMPRESSION:  *Obstructive jaundice with distal bile duct stricture, question neoplastic versus inflammatory.  Status post PTC on 5/28 with Dr. Pascal Lux.  No brushings performed.  Was for EUS today, but presenting with right-sided abdominal pain that seems to be more located in the right lower quadrant, moderately tender on exam with some guarding.  LFTs are stable/slightly improved.  Question if this is related to the above-stated issues or something new/different.    PLAN: -IR has seen the patient and they agree with CT scan with IV contrast.  CT ordered with PO and IV contrast with premedication due to contrast allergy.  Await results of CT scan. -Her EUS for today has been canceled, but she has been tentatively scheduled for tomorrow at 11:30 AM. -Trend LFTs.  Laban Emperor. Zehr  09/13/2020, 9:00 AM   ________________________________________________________________________  Velora Heckler GI MD note:  I personally examined the patient, reviewed the data and agree with the assessment and plan described above.  She has a distal bile duct stricture, Roux en Y anatomy, presented with relatively painless jaundice,  weight loss.  PTC int/ext drain placed by IR last week. Was scheduled for EUS attempt today to get more information about the distal bile duct region however she's been admitted with new, somewhat acute RLQ pain. She is tender on exam and needs further testing. We are ordering at CT scan abd/peliv with premedicaion for contrast allergy.  Hopefully this will not delay further care of her distal bile duct stricture much longer, we've tentatively booked her for that EUS tomorrow around lunchtime.   Owens Loffler, MD [facility] Pager [phone]

## 2020-09-13 NOTE — Plan of Care (Signed)

## 2020-09-14 ENCOUNTER — Encounter (HOSPITAL_COMMUNITY)
Admission: EM | Disposition: A | Discharge status: Home / Self Care | Payer: Self-pay | Source: Home / Self Care | Attending: Internal Medicine

## 2020-09-14 ENCOUNTER — Inpatient Hospital Stay (HOSPITAL_COMMUNITY): Payer: 59 | Admitting: Anesthesiology

## 2020-09-14 ENCOUNTER — Encounter (HOSPITAL_COMMUNITY): Payer: Self-pay | Admitting: Internal Medicine

## 2020-09-14 DIAGNOSIS — L508 Other urticaria: Secondary | ICD-10-CM

## 2020-09-14 HISTORY — PX: EUS: SHX5427

## 2020-09-14 HISTORY — PX: ESOPHAGOGASTRODUODENOSCOPY: SHX5428

## 2020-09-14 LAB — COMPREHENSIVE METABOLIC PANEL
ALT: 77 U/L — ABNORMAL HIGH (ref 0–44)
AST: 28 U/L (ref 15–41)
Albumin: 2.4 g/dL — ABNORMAL LOW (ref 3.5–5.0)
Alkaline Phosphatase: 321 U/L — ABNORMAL HIGH (ref 38–126)
Anion gap: 7 (ref 5–15)
BUN: 8 mg/dL (ref 6–20)
CO2: 24 mmol/L (ref 22–32)
Calcium: 8.3 mg/dL — ABNORMAL LOW (ref 8.9–10.3)
Chloride: 106 mmol/L (ref 98–111)
Creatinine, Ser: 0.6 mg/dL (ref 0.44–1.00)
GFR, Estimated: >60 mL/min (ref >60)
Glucose, Bld: 120 mg/dL — ABNORMAL HIGH (ref 70–99)
Potassium: 4.3 mmol/L (ref 3.5–5.1)
Sodium: 137 mmol/L (ref 135–145)
Total Bilirubin: 2.5 mg/dL — ABNORMAL HIGH (ref 0.3–1.2)
Total Protein: 5.5 g/dL — ABNORMAL LOW (ref 6.5–8.1)

## 2020-09-14 LAB — CBC
HCT: 34 % — ABNORMAL LOW (ref 36.0–46.0)
Hemoglobin: 11.7 g/dL — ABNORMAL LOW (ref 12.0–15.0)
MCH: 33.6 pg (ref 26.0–34.0)
MCHC: 34.4 g/dL (ref 30.0–36.0)
MCV: 97.7 fL (ref 80.0–100.0)
Platelets: 332 10*3/uL (ref 150–400)
RBC: 3.48 MIL/uL — ABNORMAL LOW (ref 3.87–5.11)
RDW: 13.2 % (ref 11.5–15.5)
WBC: 9.4 10*3/uL (ref 4.0–10.5)
nRBC: 0 % (ref 0.0–0.2)

## 2020-09-14 LAB — MAGNESIUM: Magnesium: 2 mg/dL (ref 1.7–2.4)

## 2020-09-14 SURGERY — ESOPHAGEAL ENDOSCOPIC ULTRASOUND (EUS) RADIAL
Anesthesia: Monitor Anesthesia Care

## 2020-09-14 SURGERY — UPPER ENDOSCOPIC ULTRASOUND (EUS) RADIAL
Anesthesia: Monitor Anesthesia Care

## 2020-09-14 MED ORDER — PROPOFOL 500 MG/50ML IV EMUL
INTRAVENOUS | Status: AC
  Filled 2020-09-14: qty 50

## 2020-09-14 MED ORDER — LORATADINE 10 MG PO TABS
10.0000 mg | ORAL_TABLET | Freq: Every day | ORAL | Status: DC
  Administered 2020-09-14 – 2020-09-15 (×2): 10 mg via ORAL
  Filled 2020-09-14 (×2): qty 1

## 2020-09-14 MED ORDER — LACTATED RINGERS IV SOLN: INTRAVENOUS | Status: DC | PRN

## 2020-09-14 MED ORDER — PROPOFOL 10 MG/ML IV BOLUS
INTRAVENOUS | Status: DC | PRN
  Administered 2020-09-14 (×4): 20 mg via INTRAVENOUS
  Administered 2020-09-14: 30 mg via INTRAVENOUS

## 2020-09-14 MED ORDER — LIDOCAINE 2% (20 MG/ML) 5 ML SYRINGE
INTRAMUSCULAR | Status: DC | PRN
  Administered 2020-09-14: 100 mg via INTRAVENOUS

## 2020-09-14 MED ORDER — SODIUM CHLORIDE 0.9 % IV SOLN
INTRAVENOUS | Status: DC
Start: 2020-09-14 — End: 2020-09-14

## 2020-09-14 MED ORDER — DIPHENHYDRAMINE HCL 25 MG PO CAPS
25.0000 mg | ORAL_CAPSULE | Freq: Four times a day (QID) | ORAL | Status: DC | PRN
  Administered 2020-09-14 (×3): 25 mg via ORAL
  Filled 2020-09-14 (×3): qty 1

## 2020-09-14 MED ORDER — PROPOFOL 500 MG/50ML IV EMUL
INTRAVENOUS | Status: DC | PRN
  Administered 2020-09-14: 125 ug/kg/min via INTRAVENOUS

## 2020-09-14 MED ORDER — PHENYLEPHRINE 40 MCG/ML (10ML) SYRINGE FOR IV PUSH (FOR BLOOD PRESSURE SUPPORT)
PREFILLED_SYRINGE | INTRAVENOUS | Status: DC | PRN
  Administered 2020-09-14: 120 ug via INTRAVENOUS

## 2020-09-14 NOTE — Telephone Encounter (Signed)
I reviewed the EUS. Awaiting further IR evaluation, input.

## 2020-09-14 NOTE — Anesthesia Preprocedure Evaluation (Signed)
Anesthesia Evaluation  Patient identified by MRN, date of birth, ID band Patient awake    Reviewed: Allergy & Precautions, NPO status , Patient's Chart, lab work & pertinent test results, reviewed documented beta blocker date and time   Airway Mallampati: II  TM Distance: >3 FB Neck ROM: Full    Dental  (+) Teeth Intact, Dental Advisory Given   Pulmonary former smoker,    breath sounds clear to auscultation       Cardiovascular hypertension, Pt. on home beta blockers and Pt. on medications  Rhythm:Regular Rate:Normal     Neuro/Psych    GI/Hepatic hiatal hernia, PUD, GERD  ,  Endo/Other    Renal/GU      Musculoskeletal  (+) Arthritis ,   Abdominal (+) + obese,   Peds  Hematology  (+) Blood dyscrasia, anemia ,   Anesthesia Other Findings   Reproductive/Obstetrics                             Anesthesia Physical  Anesthesia Plan  ASA: III  Anesthesia Plan: MAC   Post-op Pain Management:    Induction: Intravenous  PONV Risk Score and Plan: 2 and Ondansetron, Propofol infusion, TIVA and Treatment may vary due to age or medical condition  Airway Management Planned: Nasal Cannula and Natural Airway  Additional Equipment:   Intra-op Plan:   Post-operative Plan:   Informed Consent: I have reviewed the patients History and Physical, chart, labs and discussed the procedure including the risks, benefits and alternatives for the proposed anesthesia with the patient or authorized representative who has indicated his/her understanding and acceptance.     Dental advisory given  Plan Discussed with: CRNA  Anesthesia Plan Comments:         Anesthesia Quick Evaluation

## 2020-09-14 NOTE — Progress Notes (Signed)
Initial Nutrition Assessment  DOCUMENTATION CODES:   Obesity unspecified  INTERVENTION:  - diet advancement as medically feasible.    NUTRITION DIAGNOSIS:   Inadequate oral intake related to acute illness,poor appetite,inability to eat as evidenced by per patient/family report,NPO status.  GOAL:   Patient will meet greater than or equal to 90% of their needs  MONITOR:   Diet advancement,Labs,Weight trends,I & O's  REASON FOR ASSESSMENT:   Malnutrition Screening Tool  ASSESSMENT:   55 y.o. female with medical history of depression, anxiety, chronic idiopathic urticaria, Roux-en-Y in 2015, and recent admission d/t obstructive jaundice s/p biliary drain placement by IR. She presented to the ED due to increase RUQ abdominal pain, dark urine, outpatient workup notable for gallstones and elevated LFTs.  Diet advanced to CLD yesterday at [place] and then NPO resumed at midnight. Patient reports not having anything during that time.   Patient laying in bed with no family or visitors present. Patient reports that she was recently discharged from the hospital, admission was last week.   She was mainly working from home during the height of the pandemic and reports that during that time she was snacking more frequently and not walking as often as she previously was. She has been working on several large, physically taxing home projects. 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   She has also been trying to resume her previous eating habits of having old fashioned oats made with water and fresh fruit for breakfast, a pre-made salad for lunch, a yogurt for a snack, and variable dinner.   Patient reports that current symptoms began ~1.5 months ago. At that time she was at her UBW of ~274 lb. Weight on 5/31 was 259 lb indicating 15 lb weight loss (5.5% body weight) in the past 1.5 months.  She reports that more recently she has  had a very poor appetite and that this has led to shakiness from low blood sugar at times at home. She states that she has recently been experiencing frequent severe nausea and diarrhea with intakes of foods that have never caused this for her in the past.   She gives the example of recently having 1/4 of a bagel with cream cheese and 3 bites of a fried egg made with cooking spray. After this small amount of intake she became severely nauseated and shortly after had an episode of diarrhea.   Per notes: - CT abdomen/pelvis with contrast showed no abscess or hematoma - pending EUS by GI, possibly today   Labs reviewed; Ca: 8.3 mg/dl, Alk Phos elevated and ALT elevated but both trending down daily. Medications reviewed; 20 mg oral pepcid/day, 200 mg solu-cortef x1 dose 6/2. IVF; NS @ 75 ml/hr.      NUTRITION - FOCUSED PHYSICAL EXAM:  completed; no muscle or fat depletions, no edema present at this time.   Diet Order:   Diet Order            Diet NPO time specified  Diet effective now                 EDUCATION NEEDS:   Not appropriate for education at this time  Skin:  Skin Assessment: Reviewed RN Assessment  Last BM:  6/2  Height:   Ht Readings from Last 1 Encounters:  09/07/20 '5\' 9"'  (1.753 m)    Weight:   Wt Readings from Last 1 Encounters:  09/07/20 118 kg  Estimated Nutritional Needs:  Kcal:  1800-2000 kcal Protein:  90-105 grams Fluid:  >/= 2.2 L/day      Jarome Matin, MS, RD, LDN, CNSC Inpatient Clinical Dietitian RD pager # available in [facility]  After hours/weekend pager # available in [facility]

## 2020-09-14 NOTE — Telephone Encounter (Signed)
Presently admitted to the hospital.  Do you think I still need to call her?

## 2020-09-14 NOTE — Progress Notes (Signed)
Patient ID: Brittany [name], female   DOB: 03-26-1966, 55 y.o.   MRN: [id number]  PROGRESS NOTE    Brittany [name]  XIP:[id number] DOB: Jun 16, 1965 DOA: 09/12/2020 PCP: [name], FNP   Brief Narrative:  55 y.o. female with medical history significant for depression, anxiety, chronic idiopathic urticaria, Roux-en-Y in 2015, and recent admission with obstructive jaundice status post biliary drain placement by IR on 09/08/2020 with plans for endoscopy ultrasound to be done as an outpatient by GI presented with worsening abdominal pain with nausea.  On presentation, she had mild leukocytosis; LFTs were elevated but improving compared to her recent discharge.  GI was consulted.  Assessment & Plan:   Severe abdominal pain in a patient with recent diagnosis of obstructive jaundice with distal bile duct stricture status post biliary drain placement by IR on 09/08/2020 Elevated LFTs -Presented with severe abdominal pain.  -LFTs slightly downtrending except for bilirubin which is slightly increased compared to yesterday -GI and IR evaluation appreciated.  CT of the abdomen and pelvis with contrast showed no abscess or hematoma. -GI planning for EUS today possibly -Continue pain management.  Repeat a.m. LFTs. -Afebrile; leukocytosis has resolved.  No features of cholangitis at this time.  Will avoid antibiotics.  Mild leukocytosis -Resolved  Depression/anxiety -Continue duloxetine and Seroquel  Hypertension -Continue metoprolol  Chronic urticaria with new diffuse urticaria -Continue Singulair -Patient developed rash since yesterday: Unclear if this is because of IV contrast.  Continue Benadryl.  DVT prophylaxis: SCDs Code Status: Full Family Communication: None at bedside Disposition Plan: Status is: Inpatient because: Inpatient level of care appropriate due to severity of illness  Dispo: The patient is from: Home              Anticipated d/c is to: Home              Patient currently  is not medically stable to d/c.   Difficult to place patient No   Consultants: GI/IR  Procedures: None  Antimicrobials: None   Subjective: Patient seen and examined at bedside.  No overnight fever or vomiting reported.  Still intermittently nauseous with abdominal pain.  Complains of rash all over.  Objective: Vitals:   09/13/20 0628 09/13/20 1410 09/13/20 2207 09/14/20 0557  BP: 124/75 (!) 149/88 (!) 149/90 133/74  Pulse: 84 76 66 75  Resp: 18 18 18 18   Temp: 99.4 F (37.4 C) 98.4 F (36.9 C) 98.7 F (37.1 C) 98.4 F (36.9 C)  TempSrc: Oral Oral Oral Oral  SpO2: 91% 96% 91% 94%    Intake/Output Summary (Last 24 hours) at 09/14/2020 6734 Last data filed at 09/14/2020 0600 Gross per 24 hour  Intake 1350.65 ml  Output 2720 ml  Net -1369.35 ml   There were no vitals filed for this visit.  Examination:  General exam: No acute distress.  On room air currently. Respiratory system: Decreased breath sounds at bases bilaterally Cardiovascular system: Rate controlled, S1-S2 heard Gastrointestinal system: Abdomen is obese, distended slightly, soft and still tender mildly in the right upper quadrant around the drain site and in right lower quadrant.  Bowel sounds are heard.  Percutaneous drain present with biliary drainage Extremities: Trace lower extremity edema; no clubbing Central nervous system: Awake and alert.  No focal neurological deficits.  Moves extremities  skin: Generalized urticarial rash throughout the body present Psychiatry: Gets anxious intermittently    Data Reviewed: I have personally reviewed following labs and imaging studies  CBC: Recent Labs  Lab 09/07/20  1155 09/08/20 0349 09/12/20 1510 09/13/20 0442 09/14/20 0356  WBC 5.6 4.4 11.3* 11.0* 9.4  NEUTROABS  --  2.5 9.9*  --   --   HGB 12.9 11.4* 13.2 13.4 11.7*  HCT 37.2 33.0* 38.8 38.2 34.0*  MCV 97.4 97.3 97.2 97.2 97.7  PLT 270 244 341 267 350   Basic Metabolic Panel: Recent Labs  Lab  09/07/20 1155 09/08/20 0349 09/09/20 0424 09/12/20 1510 09/13/20 0442 09/14/20 0356  NA 139 139 138 140 138 137  K 3.1* 3.5 3.8 3.5 4.4 4.3  CL 107 105 105 107 107 106  CO2 25 25 25 23  20* 24  GLUCOSE 122* 107* 145* 141* 124* 120*  BUN 6 6 6 11 12 8   CREATININE 0.60 0.38* 0.37* 0.50 0.62 0.60  CALCIUM 9.0 8.6* 9.0 8.9 9.2 8.3*  MG 1.8  --   --   --  1.8 2.0   GFR: Estimated Creatinine Clearance: 109 mL/min (by C-G formula based on SCr of 0.6 mg/dL). Liver Function Tests: Recent Labs  Lab 09/09/20 0424 09/10/20 0458 09/12/20 1510 09/13/20 0442 09/14/20 0356  AST 144* 63* 29 28 28   ALT 390* 254* 167* 125* 77*  ALKPHOS 692* 560* 515* 421* 321*  BILITOT 2.9* 2.4* 2.2* 2.1* 2.5*  PROT 6.3* 5.7* 7.1 6.3* 5.5*  ALBUMIN 3.1* 2.9* 3.5 2.9* 2.4*   Recent Labs  Lab 09/07/20 1155 09/12/20 1510  LIPASE 17 17   No results for input(s): AMMONIA in the last 168 hours. Coagulation Profile: Recent Labs  Lab 09/07/20 1845  INR 0.9   Cardiac Enzymes: No results for input(s): CKTOTAL, CKMB, CKMBINDEX, TROPONINI in the last 168 hours. BNP (last 3 results) No results for input(s): PROBNP in the last 8760 hours. HbA1C: No results for input(s): HGBA1C in the last 72 hours. CBG: No results for input(s): GLUCAP in the last 168 hours. Lipid Profile: No results for input(s): CHOL, HDL, LDLCALC, TRIG, CHOLHDL, LDLDIRECT in the last 72 hours. Thyroid Function Tests: No results for input(s): TSH, T4TOTAL, FREET4, T3FREE, THYROIDAB in the last 72 hours. Anemia Panel: No results for input(s): VITAMINB12, FOLATE, FERRITIN, TIBC, IRON, RETICCTPCT in the last 72 hours. Sepsis Labs: No results for input(s): PROCALCITON, LATICACIDVEN in the last 168 hours.  Recent Results (from the past 240 hour(s))  Urine Culture     Status: None   Collection Time: 09/07/20  9:25 AM   Specimen: Urine  Result Value Ref Range Status   MICRO NUMBER: [id number]  Final   SPECIMEN QUALITY: Adequate  Final    Sample Source NOT GIVEN  Final   STATUS: FINAL  Final   Result:   Final    Mixed genital flora isolated. These superficial bacteria are not indicative of a urinary tract infection. No further organism identification is warranted on this specimen. If clinically indicated, recollect clean-catch, mid-stream urine and transfer  immediately to Urine Culture Transport Tube.   SARS CORONAVIRUS 2 (TAT 6-24 HRS) Nasopharyngeal Nasopharyngeal Swab     Status: None   Collection Time: 09/07/20  5:28 PM   Specimen: Nasopharyngeal Swab  Result Value Ref Range Status   SARS Coronavirus 2 NEGATIVE NEGATIVE Final    Comment: (NOTE) SARS-CoV-2 target nucleic acids are NOT DETECTED.  The SARS-CoV-2 RNA is generally detectable in upper and lower respiratory specimens during the acute phase of infection. Negative results do not preclude SARS-CoV-2 infection, do not rule out co-infections with other pathogens, and should not be used as the sole basis for  treatment or other patient management decisions. Negative results must be combined with clinical observations, patient history, and epidemiological information. The expected result is Negative.  Fact Sheet for Patients: SugarRoll.be  Fact Sheet for Healthcare Providers: https://www.woods-mathews.com/  This test is not yet approved or cleared by the [place] FDA and  has been authorized for detection and/or diagnosis of SARS-CoV-2 by FDA under an Emergency Use Authorization (EUA). This EUA will remain  in effect (meaning this test can be used) for the duration of the COVID-19 declaration under Se ction 564(b)(1) of the Act, 21 U.S.C. section 360bbb-3(b)(1), unless the authorization is terminated or revoked sooner.  Performed at [facility], [place] [address], [place], [place] [address]   Resp Panel by RT-PCR (Flu A&B, Covid) Nasopharyngeal Swab     Status: None   Collection Time: 09/12/20  7:39 PM    Specimen: Nasopharyngeal Swab; Nasopharyngeal(NP) swabs in vial transport medium  Result Value Ref Range Status   SARS Coronavirus 2 by RT PCR NEGATIVE NEGATIVE Final    Comment: (NOTE) SARS-CoV-2 target nucleic acids are NOT DETECTED.  The SARS-CoV-2 RNA is generally detectable in upper respiratory specimens during the acute phase of infection. The lowest concentration of SARS-CoV-2 viral copies this assay can detect is 138 copies/mL. A negative result does not preclude SARS-Cov-2 infection and should not be used as the sole basis for treatment or other patient management decisions. A negative result may occur with  improper specimen collection/handling, submission of specimen other than nasopharyngeal swab, presence of viral mutation(s) within the areas targeted by this assay, and inadequate number of viral copies(<138 copies/mL). A negative result must be combined with clinical observations, patient history, and epidemiological information. The expected result is Negative.  Fact Sheet for Patients:  EntrepreneurPulse.com.au  Fact Sheet for Healthcare Providers:  IncredibleEmployment.be  This test is no t yet approved or cleared by the [place] FDA and  has been authorized for detection and/or diagnosis of SARS-CoV-2 by FDA under an Emergency Use Authorization (EUA). This EUA will remain  in effect (meaning this test can be used) for the duration of the COVID-19 declaration under Section 564(b)(1) of the Act, 21 U.S.C.section 360bbb-3(b)(1), unless the authorization is terminated  or revoked sooner.       Influenza A by PCR NEGATIVE NEGATIVE Final   Influenza B by PCR NEGATIVE NEGATIVE Final    Comment: (NOTE) The Xpert Xpress SARS-CoV-2/FLU/RSV plus assay is intended as an aid in the diagnosis of influenza from Nasopharyngeal swab specimens and should not be used as a sole basis for treatment. Nasal washings and aspirates are  unacceptable for Xpert Xpress SARS-CoV-2/FLU/RSV testing.  Fact Sheet for Patients: EntrepreneurPulse.com.au  Fact Sheet for Healthcare Providers: IncredibleEmployment.be  This test is not yet approved or cleared by the [place] FDA and has been authorized for detection and/or diagnosis of SARS-CoV-2 by FDA under an Emergency Use Authorization (EUA). This EUA will remain in effect (meaning this test can be used) for the duration of the COVID-19 declaration under Section 564(b)(1) of the Act, 21 U.S.C. section 360bbb-3(b)(1), unless the authorization is terminated or revoked.  Performed at [facility], [place] [address], [place], [place] [address]          Radiology Studies: CT ABDOMEN PELVIS W CONTRAST  Result Date: 09/13/2020 CLINICAL DATA:  Right lower quadrant pain, status post internal/external biliary drainage catheter for distal common bile duct stricture EXAM: CT ABDOMEN AND PELVIS WITH CONTRAST TECHNIQUE: Multidetector CT imaging  of the abdomen and pelvis was performed using the standard protocol following bolus administration of intravenous contrast. CONTRAST:  149mL OMNIPAQUE IOHEXOL 300 MG/ML  SOLN COMPARISON:  Multiple previous exams to include ultrasound from 09/07/2020 as well as an MRI from 09/07/2020 and subsequent interventional biliary drainage. Additionally prior CT from 2018 is compared. FINDINGS: Lower chest: Bibasilar atelectasis is noted slightly greater on the right likely related to the interval biliary drain placement and decreased inspiratory effort. Hepatobiliary: Liver shows diffuse hypo density consistent with fatty infiltration. Gallbladder is well distended with multiple gallstones within. Biliary drainage catheter is noted in satisfactory position with both internal and external component identified. The central biliary ductal dilatation within the liver has resolved following catheter drainage. A  small amount of subcapsular fluid is noted along the right lobe of the liver related to the prior catheter placement. No hematoma is identified. Pancreas: Unremarkable. No pancreatic ductal dilatation or surrounding inflammatory changes. Spleen: Scarring is noted within the spleen with scattered calcifications. Adrenals/Urinary Tract: Adrenal glands are within normal limits. Kidneys show no renal calculi or obstructive changes. Bladder is well distended. Stomach/Bowel: Diverticular changes noted within the sigmoid colon with significant wall hypertrophy although no diverticulitis is seen. More proximal colon extends into an outpouching of the anterior abdominal wall related to rectus diastasis. This is stable from the prior exam. The appendix is within normal limits. Small bowel appears within normal limits. 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 Vascular/Lymphatic: No significant vascular findings are present. No enlarged abdominal or pelvic lymph nodes. Reproductive: Status post hysterectomy. No adnexal masses. Other: No free fluid or free air is noted. Laxity of the anterior abdominal wall is noted stable from the prior exam. Musculoskeletal: Degenerative changes of the lumbar spine are noted. IMPRESSION: Changes consistent with prior percutaneous biliary drain with both internal and external component. There has been decompression of the intrahepatic ductal dilatation when compared with the prior MRI. A small amount of perihepatic fluid is noted related to the recent catheter placement this is felt to be within normal limits. No abscess or hematoma is seen. Cholelithiasis without complicating factors. Diverticular change without diverticulitis. Normal-appearing appendix. Bibasilar atelectasis right slightly greater than left likely related to poor inspiratory effort Electronically Signed   By: Inez Catalina M.D.   On: 09/13/2020 18:50        Scheduled Meds: . DULoxetine  60 mg Oral BID  .  famotidine  20 mg Oral Daily  . metoprolol succinate  25 mg Oral Daily  . montelukast  10 mg Oral QHS  . QUEtiapine  150 mg Oral QHS   Continuous Infusions: . sodium chloride 75 mL/hr at 09/13/20 1200          Sheriden Archibeque Starla Link, MD [facility] 09/14/2020, 7:28 AM

## 2020-09-14 NOTE — Plan of Care (Signed)

## 2020-09-14 NOTE — Progress Notes (Addendum)
Attending physician's note   I have taken an interval history, reviewed the chart and examined the patient. I agree with the Advanced Practitioner's note, impression, and recommendations as outlined.   CT reviewed with the patient as outlined below.  Liver enzymes downtrending with AST/ALT 20/77 and ALP 321, with slight uptrend in T bili at 2.5 (from 2.1 yesterday).  Pain overall improved.  Does have a diffuse rash.  Does take Zyrtec, Pepcid, and Singulair at home.  - EUS today with Dr. Ardis Hughs - Continue trending liver enzymes - Starting antihistamine in addition to H2RA and Singulair as outlined  Davontae Prusinski, DO, FACG [phone] office             [facility] Progress Note  CC:  Bile duct stricture and right sided abdominal pain  Subjective:  Still with pain, but is controlled with pain medication.  Has a diffuse red rash that she says was present at last hospitalization last week and was thought to be contact dermatitis.     Objective:  Vital signs in last 24 hours: Temp:  [98.4 F (36.9 C)-98.7 F (37.1 C)] 98.4 F (36.9 C) (06/03 0557) Pulse Rate:  [66-76] 75 (06/03 0557) Resp:  [18] 18 (06/03 0557) BP: (133-149)/(74-90) 133/74 (06/03 0557) SpO2:  [91 %-96 %] 94 % (06/03 0557) Last BM Date: 09/11/20 General:  Alert, Well-developed, in NAD Heart:  Regular rate and rhythm; no murmurs Pulm:  CTAB.  No W/R/R. Abdomen:  Soft, non-distended.  BS present.  Drain noted in RUQ with minimal output.  TTP all along the right side and epigastrium some as well. Extremities:  Without edema. Neurologic:  Alert and oriented x 4;  grossly normal neurologically. Psych:  Alert and cooperative. Normal mood and affect.  Intake/Output from previous day: 06/02 0701 - 06/03 0700 In: 1350.7 [P.O.:240; I.V.:1110.7] Out: 2720 [Urine:2500; Drains:220]  Lab Results: Recent Labs    09/12/20 1510 09/13/20 0442 09/14/20 0356  WBC 11.3* 11.0* 9.4  HGB 13.2 13.4  11.7*  HCT 38.8 38.2 34.0*  PLT 341 267 332   BMET Recent Labs    09/12/20 1510 09/13/20 0442 09/14/20 0356  NA 140 138 137  K 3.5 4.4 4.3  CL 107 107 106  CO2 23 20* 24  GLUCOSE 141* 124* 120*  BUN '11 12 8  ' CREATININE 0.50 0.62 0.60  CALCIUM 8.9 9.2 8.3*   LFT Recent Labs    09/14/20 0356  PROT 5.5*  ALBUMIN 2.4*  AST 28  ALT 77*  ALKPHOS 321*  BILITOT 2.5*   CT ABDOMEN PELVIS W CONTRAST  Result Date: 09/13/2020 CLINICAL DATA:  Right lower quadrant pain, status post internal/external biliary drainage catheter for distal common bile duct stricture EXAM: CT ABDOMEN AND PELVIS WITH CONTRAST TECHNIQUE: Multidetector CT imaging of the abdomen and pelvis was performed using the standard protocol following bolus administration of intravenous contrast. CONTRAST:  17m OMNIPAQUE IOHEXOL 300 MG/ML  SOLN COMPARISON:  Multiple previous exams to include ultrasound from 09/07/2020 as well as an MRI from 09/07/2020 and subsequent interventional biliary drainage. Additionally prior CT from 2018 is compared. FINDINGS: Lower chest: Bibasilar atelectasis is noted slightly greater on the right likely related to the interval biliary drain placement and decreased inspiratory effort. Hepatobiliary: Liver shows diffuse hypo density consistent with fatty infiltration. Gallbladder is well distended with multiple gallstones within. Biliary drainage catheter is noted in satisfactory position with both internal and external component identified. The central biliary ductal dilatation within the liver has resolved  following catheter drainage. A small amount of subcapsular fluid is noted along the right lobe of the liver related to the prior catheter placement. No hematoma is identified. Pancreas: Unremarkable. No pancreatic ductal dilatation or surrounding inflammatory changes. Spleen: Scarring is noted within the spleen with scattered calcifications. Adrenals/Urinary Tract: Adrenal glands are within normal  limits. Kidneys show no renal calculi or obstructive changes. Bladder is well distended. Stomach/Bowel: Diverticular changes noted within the sigmoid colon with significant wall hypertrophy although no diverticulitis is seen. More proximal colon extends into an outpouching of the anterior abdominal wall related to rectus diastasis. This is stable from the prior exam. The appendix is within normal limits. Small bowel appears within normal limits. 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 Vascular/Lymphatic: No significant vascular findings are present. No enlarged abdominal or pelvic lymph nodes. Reproductive: Status post hysterectomy. No adnexal masses. Other: No free fluid or free air is noted. Laxity of the anterior abdominal wall is noted stable from the prior exam. Musculoskeletal: Degenerative changes of the lumbar spine are noted. IMPRESSION: Changes consistent with prior percutaneous biliary drain with both internal and external component. There has been decompression of the intrahepatic ductal dilatation when compared with the prior MRI. A small amount of perihepatic fluid is noted related to the recent catheter placement this is felt to be within normal limits. No abscess or hematoma is seen. Cholelithiasis without complicating factors. Diverticular change without diverticulitis. Normal-appearing appendix. Bibasilar atelectasis right slightly greater than left likely related to poor inspiratory effort Electronically Signed   By: Inez Catalina M.D.   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  Status post PTC on 5/28 with Dr. Pascal Lux.  No brushings performed.  Was for EUS today, but presenting with right-sided abdominal pain that seems to be more located in the right lower quadrant, moderately tender on exam with some guarding.  LFTs are stable/slightly improved.  Question if this is related to the  above-stated issues or something new/different.  CT scan did not show any new/different/acute issues.  Still having pain but is controlled with pain medication.  Not sure the source at this point. *Rash:  Diffuse.  Started at last hospitalization and was suspected to be contact dermatitis.  She is on her home singulair and pepcid.  Will start Claritin 10 mg daily (takes Zyrtec at home but it is not on formulary here).  Has received Benadryl as well.  -EUS with Dr. Ardis Hughs later this morning. -Trend LFTs.   LOS: 1 day   Laban Emperor. Zehr  09/14/2020, 8:44 AM

## 2020-09-14 NOTE — Interval H&P Note (Signed)
History and Physical Interval Note:  09/14/2020 11:27 AM  Brittany [name]  has presented today for surgery, with the diagnosis of bile duct stricture.  The various methods of treatment have been discussed with the patient and family. After consideration of risks, benefits and other options for treatment, the patient has consented to  Procedure(s): UPPER ENDOSCOPIC ULTRASOUND (EUS) RADIAL (N/A) ESOPHAGOGASTRODUODENOSCOPY (EGD) (N/A) as a surgical intervention.  The patient's history has been reviewed, patient examined, no change in status, stable for surgery.  I have reviewed the patient's chart and labs.  Questions were answered to the patient's satisfaction.     [name]

## 2020-09-14 NOTE — Op Note (Signed)
[facility] Patient Name: Brittany [name] Procedure Date: 09/14/2020 MRN: [id number] Attending MD: [name] , MD Date of Birth: Aug 24, 1965 CSN: [id number] Age: 55 Admit Type: Outpatient Procedure:                Upper EUS Indications:              Recently presented with relatively painless                            jaundice. MRI with MRCP 08/2020 "Moderate intra and                            extrahepatic biliary ductal dilatation with what                            appears to be a stricture of the mid common bile                            duct. Notably, there are some areas of mildly                            increased enhancement adjacent to the mid to distal                            common bile duct in the head of the pancreas.                            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 s/p PTC with                            int/ext biliary drain placed across distal CBD                            stricture. Here for EUS attempt to evaluate the                            distal CBD, head of pancreas. Providers:                [name], MD, Kary Kos RN, RN, Lesia Sago, Technician Referring MD:              Medicines:  Monitored Anesthesia Care Complications:            No immediate complications. Estimated blood loss:                            None. Estimated Blood Loss:     Estimated blood loss: none. Procedure:                Pre-Anesthesia Assessment:                           - Prior to the procedure, a History and Physical                            was performed, and patient medications and                            allergies were reviewed. The patient's  tolerance of                            previous anesthesia was also reviewed. The risks                            and benefits of the procedure and the sedation                            options and risks were discussed with the patient.                            All questions were answered, and informed consent                            was obtained. Prior Anticoagulants: The patient has                            taken no previous anticoagulant or antiplatelet                            agents. ASA Grade Assessment: III - A patient with                            severe systemic disease. After reviewing the risks                            and benefits, the patient was deemed in                            satisfactory condition to undergo the procedure.                           After obtaining informed consent, the endoscope was                            passed under direct vision. Throughout the  procedure, the patient's blood pressure, pulse, and                            oxygen saturations were monitored continuously. The                            [id number] ([id number]) Olympus Radial EUS was                            introduced through the mouth, and advanced to the                            region of the gastroenteric anastomoses. The upper                            EUS was accomplished without difficulty. The                            patient tolerated the procedure well. Scope In: Scope Out: Findings:      ENDOSCOPIC FINDING: :      The examined esophagus was endoscopically normal.      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      EUS findings:      1. I was unable to view the region of the head of pancreas, distal bile       duct due to the post bariatric surgery anatomy.      2. Pancreatic parenchyma in the body and tail were normal.      3. Limited views of the  liver were normal. 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 Moderate Sedation:      Not Applicable - Patient had care per Anesthesia. Recommendation:           - Return patient to hospital ward for ongoing care.                           - We will contact IR to attempt to sample the                            distal bile duct stricture and also will consult                            surgery. Her presentation of relatively painless                            obstructive jaundice, weight loss and MR "mildly  increased enhancement adjacent to the mid to distal                            common bile duct in the head of the pancreas" is                            concerning for potential pancreatic/biliary                            neoplasm. Procedure Code(s):        --- Professional ---                           [phone], Esophagogastroduodenoscopy, flexible,                            transoral; with endoscopic ultrasound examination                            limited to the esophagus, stomach or duodenum, and                            adjacent structures Diagnosis Code(s):        --- Professional ---                           R93.3, Abnormal findings on diagnostic imaging of                            other parts of digestive tract CPT copyright 2019 [employer]. All rights reserved. The codes documented in this report are preliminary and upon coder review may  be revised to meet current compliance requirements. [name], MD 09/14/2020 12:05:24 PM This report has been signed electronically. Number of Addenda: 0

## 2020-09-14 NOTE — Anesthesia Postprocedure Evaluation (Signed)
Anesthesia Post Note  Patient: [name]  Procedure(s) Performed: UPPER ENDOSCOPIC ULTRASOUND (EUS) RADIAL (N/A ) ESOPHAGOGASTRODUODENOSCOPY (EGD) (N/A )     Patient location during evaluation: PACU Anesthesia Type: MAC Level of consciousness: awake and alert Pain management: pain level controlled Vital Signs Assessment: post-procedure vital signs reviewed and stable Respiratory status: spontaneous breathing, nonlabored ventilation, respiratory function stable and patient connected to nasal cannula oxygen Cardiovascular status: stable and blood pressure returned to baseline Postop Assessment: no apparent nausea or vomiting Anesthetic complications: no   No complications documented.  Last Vitals:  Vitals:   09/14/20 1200 09/14/20 1210  BP: (!) 109/53 113/63  Pulse: 87 85  Resp: 20 19  Temp:    SpO2: 98% 95%    Last Pain:  Vitals:   09/14/20 1210  TempSrc:   PainSc: 0-No pain                 Brittany [name]

## 2020-09-14 NOTE — H&P (View-Only) (Signed)
Attending physician's note   I have taken an interval history, reviewed the chart and examined the patient. I agree with the Advanced Practitioner's note, impression, and recommendations as outlined.   CT reviewed with the patient as outlined below.  Liver enzymes downtrending with AST/ALT 20/77 and ALP 321, with slight uptrend in T bili at 2.5 (from 2.1 yesterday).  Pain overall improved.  Does have a diffuse rash.  Does take Zyrtec, Pepcid, and Singulair at home.  - EUS today with Dr. Ardis Hughs - Continue trending liver enzymes - Starting antihistamine in addition to H2RA and Singulair as outlined  Lashika Erker, DO, FACG [phone] office             [facility] Progress Note  CC:  Bile duct stricture and right sided abdominal pain  Subjective:  Still with pain, but is controlled with pain medication.  Has a diffuse red rash that she says was present at last hospitalization last week and was thought to be contact dermatitis.     Objective:  Vital signs in last 24 hours: Temp:  [98.4 F (36.9 C)-98.7 F (37.1 C)] 98.4 F (36.9 C) (06/03 0557) Pulse Rate:  [66-76] 75 (06/03 0557) Resp:  [18] 18 (06/03 0557) BP: (133-149)/(74-90) 133/74 (06/03 0557) SpO2:  [91 %-96 %] 94 % (06/03 0557) Last BM Date: 09/11/20 General:  Alert, Well-developed, in NAD Heart:  Regular rate and rhythm; no murmurs Pulm:  CTAB.  No W/R/R. Abdomen:  Soft, non-distended.  BS present.  Drain noted in RUQ with minimal output.  TTP all along the right side and epigastrium some as well. Extremities:  Without edema. Neurologic:  Alert and oriented x 4;  grossly normal neurologically. Psych:  Alert and cooperative. Normal mood and affect.  Intake/Output from previous day: 06/02 0701 - 06/03 0700 In: 1350.7 [P.O.:240; I.V.:1110.7] Out: 2720 [Urine:2500; Drains:220]  Lab Results: Recent Labs    09/12/20 1510 09/13/20 0442 09/14/20 0356  WBC 11.3* 11.0* 9.4  HGB 13.2 13.4  11.7*  HCT 38.8 38.2 34.0*  PLT 341 267 332   BMET Recent Labs    09/12/20 1510 09/13/20 0442 09/14/20 0356  NA 140 138 137  K 3.5 4.4 4.3  CL 107 107 106  CO2 23 20* 24  GLUCOSE 141* 124* 120*  BUN '11 12 8  ' CREATININE 0.50 0.62 0.60  CALCIUM 8.9 9.2 8.3*   LFT Recent Labs    09/14/20 0356  PROT 5.5*  ALBUMIN 2.4*  AST 28  ALT 77*  ALKPHOS 321*  BILITOT 2.5*   CT ABDOMEN PELVIS W CONTRAST  Result Date: 09/13/2020 CLINICAL DATA:  Right lower quadrant pain, status post internal/external biliary drainage catheter for distal common bile duct stricture EXAM: CT ABDOMEN AND PELVIS WITH CONTRAST TECHNIQUE: Multidetector CT imaging of the abdomen and pelvis was performed using the standard protocol following bolus administration of intravenous contrast. CONTRAST:  169m OMNIPAQUE IOHEXOL 300 MG/ML  SOLN COMPARISON:  Multiple previous exams to include ultrasound from 09/07/2020 as well as an MRI from 09/07/2020 and subsequent interventional biliary drainage. Additionally prior CT from 2018 is compared. FINDINGS: Lower chest: Bibasilar atelectasis is noted slightly greater on the right likely related to the interval biliary drain placement and decreased inspiratory effort. Hepatobiliary: Liver shows diffuse hypo density consistent with fatty infiltration. Gallbladder is well distended with multiple gallstones within. Biliary drainage catheter is noted in satisfactory position with both internal and external component identified. The central biliary ductal dilatation within the liver has resolved  following catheter drainage. A small amount of subcapsular fluid is noted along the right lobe of the liver related to the prior catheter placement. No hematoma is identified. Pancreas: Unremarkable. No pancreatic ductal dilatation or surrounding inflammatory changes. Spleen: Scarring is noted within the spleen with scattered calcifications. Adrenals/Urinary Tract: Adrenal glands are within normal  limits. Kidneys show no renal calculi or obstructive changes. Bladder is well distended. Stomach/Bowel: Diverticular changes noted within the sigmoid colon with significant wall hypertrophy although no diverticulitis is seen. More proximal colon extends into an outpouching of the anterior abdominal wall related to rectus diastasis. This is stable from the prior exam. The appendix is within normal limits. Small bowel appears within normal limits. 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 Vascular/Lymphatic: No significant vascular findings are present. No enlarged abdominal or pelvic lymph nodes. Reproductive: Status post hysterectomy. No adnexal masses. Other: No free fluid or free air is noted. Laxity of the anterior abdominal wall is noted stable from the prior exam. Musculoskeletal: Degenerative changes of the lumbar spine are noted. IMPRESSION: Changes consistent with prior percutaneous biliary drain with both internal and external component. There has been decompression of the intrahepatic ductal dilatation when compared with the prior MRI. A small amount of perihepatic fluid is noted related to the recent catheter placement this is felt to be within normal limits. No abscess or hematoma is seen. Cholelithiasis without complicating factors. Diverticular change without diverticulitis. Normal-appearing appendix. Bibasilar atelectasis right slightly greater than left likely related to poor inspiratory effort Electronically Signed   By: Inez Catalina M.D.   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  Status post PTC on 5/28 with Dr. Pascal Lux.  No brushings performed.  Was for EUS today, but presenting with right-sided abdominal pain that seems to be more located in the right lower quadrant, moderately tender on exam with some guarding.  LFTs are stable/slightly improved.  Question if this is related to the  above-stated issues or something new/different.  CT scan did not show any new/different/acute issues.  Still having pain but is controlled with pain medication.  Not sure the source at this point. *Rash:  Diffuse.  Started at last hospitalization and was suspected to be contact dermatitis.  She is on her home singulair and pepcid.  Will start Claritin 10 mg daily (takes Zyrtec at home but it is not on formulary here).  Has received Benadryl as well.  -EUS with Dr. Ardis Hughs later this morning. -Trend LFTs.   LOS: 1 day   Laban Emperor. Zehr  09/14/2020, 8:44 AM

## 2020-09-14 NOTE — Transfer of Care (Signed)
Immediate Anesthesia Transfer of Care Note  Patient: Brittany [name]  Procedure(s) Performed: UPPER ENDOSCOPIC ULTRASOUND (EUS) RADIAL (N/A ) ESOPHAGOGASTRODUODENOSCOPY (EGD) (N/A )  Patient Location: PACU  Anesthesia Type:MAC  Level of Consciousness: awake, alert  and oriented  Airway & Oxygen Therapy: Patient Spontanous Breathing and Patient connected to face mask oxygen  Post-op Assessment: Report given to RN and Post -op Vital signs reviewed and stable  Post vital signs: Reviewed and stable  Last Vitals:  Vitals Value Taken Time  BP    Temp    Pulse    Resp    SpO2      Last Pain:  Vitals:   09/14/20 1046  TempSrc:   PainSc: 4       Patients Stated Pain Goal: 2 (00/93/81 8299)  Complications: No complications documented.

## 2020-09-14 NOTE — Plan of Care (Signed)

## 2020-09-14 NOTE — Telephone Encounter (Signed)
I don't think she would need to be called.  She is getting care in the hospital.

## 2020-09-15 LAB — COMPREHENSIVE METABOLIC PANEL
ALT: 53 U/L — ABNORMAL HIGH (ref 0–44)
AST: 10 U/L — ABNORMAL LOW (ref 15–41)
Albumin: 2.2 g/dL — ABNORMAL LOW (ref 3.5–5.0)
Alkaline Phosphatase: 262 U/L — ABNORMAL HIGH (ref 38–126)
Anion gap: 9 (ref 5–15)
BUN: 12 mg/dL (ref 6–20)
CO2: 25 mmol/L (ref 22–32)
Calcium: 8.1 mg/dL — ABNORMAL LOW (ref 8.9–10.3)
Chloride: 106 mmol/L (ref 98–111)
Creatinine, Ser: 0.53 mg/dL (ref 0.44–1.00)
GFR, Estimated: >60 mL/min (ref >60)
Glucose, Bld: 106 mg/dL — ABNORMAL HIGH (ref 70–99)
Potassium: 3.2 mmol/L — ABNORMAL LOW (ref 3.5–5.1)
Sodium: 140 mmol/L (ref 135–145)
Total Bilirubin: 1.5 mg/dL — ABNORMAL HIGH (ref 0.3–1.2)
Total Protein: 5.3 g/dL — ABNORMAL LOW (ref 6.5–8.1)

## 2020-09-15 LAB — CBC
HCT: 34.6 % — ABNORMAL LOW (ref 36.0–46.0)
Hemoglobin: 11.5 g/dL — ABNORMAL LOW (ref 12.0–15.0)
MCH: 33 pg (ref 26.0–34.0)
MCHC: 33.2 g/dL (ref 30.0–36.0)
MCV: 99.1 fL (ref 80.0–100.0)
Platelets: 308 10*3/uL (ref 150–400)
RBC: 3.49 MIL/uL — ABNORMAL LOW (ref 3.87–5.11)
RDW: 13 % (ref 11.5–15.5)
WBC: 10.5 10*3/uL (ref 4.0–10.5)
nRBC: 0 % (ref 0.0–0.2)

## 2020-09-15 LAB — MAGNESIUM: Magnesium: 1.8 mg/dL (ref 1.7–2.4)

## 2020-09-15 MED ORDER — POTASSIUM CHLORIDE CRYS ER 20 MEQ PO TBCR
40.0000 meq | EXTENDED_RELEASE_TABLET | ORAL | Status: DC
  Administered 2020-09-15: 40 meq via ORAL
  Filled 2020-09-15: qty 2

## 2020-09-15 NOTE — Progress Notes (Signed)
Patient was given discharge instructions, and all questions were answered.  Patient was stable for discharge and was taken to the main exit by wheelchair. 

## 2020-09-15 NOTE — Discharge Summary (Signed)
Physician Discharge Summary  Brittany [name] WPY:[id number] DOB: 1965-10-08 DOA: 09/12/2020  PCP: [name], FNP  Admit date: 09/12/2020 Discharge date: 09/15/2020  Admitted From: Home Disposition: Home  Recommendations for Outpatient Follow-up:  1. Follow up with PCP in 1 week with repeat CBC/CMP 2. Outpatient follow-up with IR at earliest convenience 3. Outpatient follow-up with GI. 4. Outpatient follow-up with general surgery 5. Follow up in ED if symptoms worsen or new appear   Home Health: No Equipment/Devices: None  Discharge Condition: Stable CODE STATUS: Full Diet recommendation: Heart healthy  Brief/Interim Summary: 55 y.o.femalewith medical history significant fordepression, anxiety, chronic idiopathic urticaria, Roux-en-Y in 2015, and recent admission with obstructive jaundice status post biliary drain placement by IR on 09/08/2020 with plans for endoscopy ultrasound to be done as an outpatient by GI presented with worsening abdominal pain with nausea.  On presentation, she had mild leukocytosis; LFTs were elevated but improving compared to her recent discharge.  GI and IR were consulted. CT of the abdomen and pelvis with contrast showed no abscess or hematoma.  Patient underwent EUS on 09/13/2020 but because of patient's Roux-en-Y bariatric anatomy, EUS visualization of the distal bile duct, head of pancreas was not successful.  GI recommended follow-up with IR and general surgery evaluation.  This will happen as an outpatient.  Subsequently, patient has tolerated diet and LFTs are improving.  GI has cleared the patient for discharge.  She will be discharged home today with outpatient follow-up with PCP/GI/IR/general surgery.  Discharge Diagnoses:   Severe abdominal pain in a patient with recent diagnosis of obstructive jaundice with distal bile duct stricture status post biliary drain placement by IR on 09/08/2020 Elevated LFTs -Presented with severe abdominal pain.    Abdominal pain has resolved. -LFTs downtrending  -GI and IR evaluation appreciated.  CT of the abdomen and pelvis with contrast showed no abscess or hematoma. -Patient underwent EUS on 09/13/2020 but because of patient's Roux-en-Y bariatric anatomy, EUS visualization of the distal bile duct, head of pancreas was not successful.  GI recommended follow-up with IR and general surgery evaluation.  This will happen as an outpatient.  Subsequently, patient has tolerated diet and LFTs are improving.  GI has cleared the patient for discharge.  She will be discharged home today with outpatient follow-up with PCP/GI/IR/general surgery.  Mild leukocytosis -Resolved  Hypokalemia -Replace prior to discharge  Depression/anxiety -Continue duloxetine and Seroquel  Hypertension -Continue metoprolol  Chronic urticaria with new diffuse urticaria -Continue Singulair -Patient developed increasing rash during hospitalization: Unclear if this is because of IV contrast.  Continue as needed Benadryl at home along with cetirizine and famotidine  Discharge Instructions  Discharge Instructions    Ambulatory referral to Gastroenterology   Complete by: As directed    Ambulatory referral to Interventional Radiology   Complete by: As directed    Diet - low sodium heart healthy   Complete by: As directed    Increase activity slowly   Complete by: As directed      Allergies as of 09/15/2020      Reactions   Contrast Media [iodinated Diagnostic Agents] Hives   Amoxicillin Hives   .Marland KitchenHas patient had a PCN reaction causing immediate rash, facial/tongue/throat swelling, SOB or lightheadedness with hypotension: Yes Has patient had a PCN reaction causing severe rash involving mucus membranes or skin necrosis: No Has patient had a PCN reaction that required hospitalization No Has patient had a PCN reaction occurring within the last 10 years: No If all of  the above answers are "NO", then may proceed with  Cephalosporin use.   Bactrim [sulfamethoxazole-trimethoprim] Hives, Rash   Lamictal [lamotrigine] Hives, Rash      Medication List    TAKE these medications   calcium-vitamin D 500-200 MG-UNIT tablet Commonly known as: OSCAL WITH D Take 1 tablet by mouth every evening. For bone health   cetirizine 10 MG tablet Commonly known as: ZYRTEC Take 10 mg by mouth 2 (two) times daily.   cholecalciferol 25 MCG (1000 UNIT) tablet Commonly known as: VITAMIN D Take 1 tablet (1,000 Units total) by mouth daily. For bone health   DULoxetine 60 MG capsule Commonly known as: CYMBALTA Take 1 capsule (60 mg total) by mouth 2 (two) times daily.   EPINEPHrine 0.3 mg/0.3 mL Soaj injection Commonly known as: EpiPen 2-Pak Inject 0.3 mg into the muscle as needed for anaphylaxis.   famotidine 20 MG tablet Commonly known as: PEPCID Take 1 tablet (20 mg total) by mouth daily.   metoprolol succinate 25 MG 24 hr tablet Commonly known as: Toprol XL Take 1 tablet (25 mg total) by mouth daily.   montelukast 10 MG tablet Commonly known as: SINGULAIR TAKE 1 TABLET(10 MG) BY MOUTH AT BEDTIME What changed: See the new instructions.   Multi-Vitamin tablet Take 1 tablet by mouth daily.   QUEtiapine 50 MG tablet Commonly known as: SEROQUEL TAKE 3 TABLETS(150 MG) BY MOUTH AT BEDTIME What changed:   how much to take  how to take this  when to take this  additional instructions   vitamin B-12 500 MCG tablet Commonly known as: CYANOCOBALAMIN Take 1 tablet (500 mcg total) by mouth daily. For Vitamin B-12 replacement What changed: additional instructions   vitamin E 1000 UNIT capsule Take 1,000 Units by mouth daily.   Xolair 150 MG injection Generic drug: omalizumab INJECT 300 MG UNDER THE SKIN EVERY 14 DAYS What changed: See the new instructions.       Follow-up Information    Clovis Riley, MD Follow up on 09/28/2020.   Specialty: General Surgery Why: Please follow up with Dr. Kae Heller  on 09/28/20 at 4:10 pm Please arrive 30 min prior to appointment to complete paperwork and bring insurance and photo ID Contact information: [address] [place] [address] [phone]        [name], [place]. Schedule an appointment as soon as possible for a visit in 1 week(s).   Specialty: Internal Medicine Why: With repeat CBC/CMP Contact information: [place] [address] [phone]        Berniece Salines, DO .   Specialty: Cardiology Contact information: [place] [address] [phone]        [place]. Schedule an appointment as soon as possible for a visit.   Specialty: Radiology Why: At earliest convenience Contact information: [place] [id number] [place] [phone]             Allergies  Allergen Reactions  . Contrast Media [Iodinated Diagnostic Agents] Hives  . Amoxicillin Hives    .Marland KitchenHas patient had a PCN reaction causing immediate rash, facial/tongue/throat swelling, SOB or lightheadedness with hypotension: Yes Has patient had a PCN reaction causing severe rash involving mucus membranes or skin necrosis: No Has patient had a PCN reaction that required hospitalization No Has patient had a PCN reaction occurring within the last 10 years: No If all of the above answers are "NO", then  may proceed with Cephalosporin use.   . Bactrim [Sulfamethoxazole-Trimethoprim] Hives and Rash  . Lamictal [Lamotrigine] Hives and Rash    Consultations:  GI/IR/general surgery who will evaluate the patient as an outpatient   Procedures/Studies: US Abdomen Complete  Result Date: 08/30/2020 CLINICAL DATA:  Elevated LFT EXAM: ABDOMEN ULTRASOUND COMPLETE COMPARISON:  CT 09/05/2016 FINDINGS: Gallbladder: Multiple shadowing stones measuring up to 9 mm. Normal wall thickness. Negative sonographic Murphy. Common  bile duct: Diameter: Up to 7.4 mm Liver: Diffusely echogenic. No focal hepatic abnormality. Portal vein is patent on color Doppler imaging with normal direction of blood flow towards the liver. IVC: No abnormality visualized. Pancreas: The duct is slightly dilated. This is a chronic finding as compared with the prior CT. Spleen: Size and appearance within normal limits. Right Kidney: Length: 12.1 cm. Echogenicity within normal limits. No mass or hydronephrosis visualized. Left Kidney: Length: 12.1 cm. Echogenicity within normal limits. No mass or hydronephrosis visualized. Abdominal aorta: No aneurysm visualized. Other findings: None. IMPRESSION: 1. Cholelithiasis without sonographic evidence for acute cholecystitis. 2. Diffusely echogenic liver consistent with hepatic steatosis and or hepatocellular disease 3. Slightly dilated common bile duct up to 7 mm. If ductal obstruction is suspected, correlation with MRCP could be obtained. Slightly dilated pancreatic duct stable as compared to CT from 2018. Electronically Signed   By: Donavan Foil M.D.   On: 08/30/2020 20:46   CT ABDOMEN PELVIS W CONTRAST  Result Date: 09/13/2020 CLINICAL DATA:  Right lower quadrant pain, status post internal/external biliary drainage catheter for distal common bile duct stricture EXAM: CT ABDOMEN AND PELVIS WITH CONTRAST TECHNIQUE: Multidetector CT imaging of the abdomen and pelvis was performed using the standard protocol following bolus administration of intravenous contrast. CONTRAST:  169mL OMNIPAQUE IOHEXOL 300 MG/ML  SOLN COMPARISON:  Multiple previous exams to include ultrasound from 09/07/2020 as well as an MRI from 09/07/2020 and subsequent interventional biliary drainage. Additionally prior CT from 2018 is compared. FINDINGS: Lower chest: Bibasilar atelectasis is noted slightly greater on the right likely related to the interval biliary drain placement and decreased inspiratory effort. Hepatobiliary: Liver shows diffuse hypo  density consistent with fatty infiltration. Gallbladder is well distended with multiple gallstones within. Biliary drainage catheter is noted in satisfactory position with both internal and external component identified. The central biliary ductal dilatation within the liver has resolved following catheter drainage. A small amount of subcapsular fluid is noted along the right lobe of the liver related to the prior catheter placement. No hematoma is identified. Pancreas: Unremarkable. No pancreatic ductal dilatation or surrounding inflammatory changes. Spleen: Scarring is noted within the spleen with scattered calcifications. Adrenals/Urinary Tract: Adrenal glands are within normal limits. Kidneys show no renal calculi or obstructive changes. Bladder is well distended. Stomach/Bowel: Diverticular changes noted within the sigmoid colon with significant wall hypertrophy although no diverticulitis is seen. More proximal colon extends into an outpouching of the anterior abdominal wall related to rectus diastasis. This is stable from the prior exam. The appendix is within normal limits. Small bowel appears within normal limits. 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 Vascular/Lymphatic: No significant vascular findings are present. No enlarged abdominal or pelvic lymph nodes. Reproductive: Status post hysterectomy. No adnexal masses. Other: No free fluid or free air is noted. Laxity of the anterior abdominal wall is noted stable from the prior exam. Musculoskeletal: Degenerative changes of the lumbar spine are noted. IMPRESSION: Changes consistent with prior percutaneous biliary drain with both internal and external component. There has been  decompression of the intrahepatic ductal dilatation when compared with the prior MRI. A small amount of perihepatic fluid is noted related to the recent catheter placement this is felt to be within normal limits. No abscess or hematoma is seen. Cholelithiasis without  complicating factors. Diverticular change without diverticulitis. Normal-appearing appendix. Bibasilar atelectasis right slightly greater than left likely related to poor inspiratory effort Electronically Signed   By: Inez Catalina M.D.   On: 09/13/2020 18:50   MR 3D Recon At Scanner  Result Date: 09/07/2020 CLINICAL DATA:  55 year old female with history of right upper quadrant abdominal pain. Common bile duct dilatation noted on ultrasound examination. Follow-up study. EXAM: MRI ABDOMEN WITHOUT AND WITH CONTRAST (INCLUDING MRCP) TECHNIQUE: Multiplanar multisequence MR imaging of the abdomen was performed both before and after the administration of intravenous contrast. Heavily T2-weighted images of the biliary and pancreatic ducts were obtained, and three-dimensional MRCP images were rendered by post processing. CONTRAST:  2mL GADAVIST GADOBUTROL 1 MMOL/ML IV SOLN COMPARISON:  No prior abdominal MRI. Abdominal ultrasound 09/07/2020. FINDINGS: Lower chest: Unremarkable. Hepatobiliary: In segment 2 of the liver (axial image 10 of series 15) there is a 2.3 x 2.0 cm T1 hypointense, T2 hyperintense lesion which demonstrates early peripheral nodular hyperenhancement with centripetal filling on delayed images, diagnostic of a cavernous hemangioma. No other suspicious cystic or solid hepatic lesions. Moderate intrahepatic biliary ductal dilatation noted on MRCP images. Common bile duct is dilated measuring 14 mm in the porta hepatis. Abrupt cut off of the common bile duct (coronal MRCP image 35 of series 8), favored to reflect a stricture. No definite filling defect within the common bile duct to suggest choledocholithiasis. There are numerous filling defects within the gallbladder, compatible with tiny gallstones. Gallbladder is moderately distended. Gallbladder wall thickness is normal. No pericholecystic fluid or surrounding inflammatory changes. Pancreas: Subtle increased enhancement is noted in the head of the  pancreas immediately inferior to the apparent stricture in the common bile duct, best appreciated on axial images 40 7-49 of series 25. No clearly definable mass is evident in this region, however, ductal lesion or periductal lesion is difficult to entirely exclude. No pancreatic ductal dilatation noted on MRCP images. No peripancreatic fluid collections or inflammatory changes. Spleen: Lobular contour of the lateral aspect of the spleen, potentially sequela of prior trauma or prior infarctions. Adrenals/Urinary Tract: Bilateral kidneys and adrenal glands are normal in appearance. No hydroureteronephrosis in the visualized portions of the abdomen. Stomach/Bowel: Ventral hernia containing a portion of the transverse colon, incompletely imaged. Visualized portions are otherwise unremarkable. Vascular/Lymphatic: No aneurysm identified in the visualized abdominal vasculature. No lymphadenopathy noted in the abdomen. Other: No significant volume of ascites noted in the visualized portions of the peritoneal cavity. Musculoskeletal: No aggressive appearing osseous lesions are noted in the visualized portions of the skeleton. IMPRESSION: 1. Moderate intra and extrahepatic biliary ductal dilatation with what appears to be a stricture of the mid common bile duct. Notably, there are some areas of mildly increased enhancement adjacent to the mid to distal common bile duct in the head of the pancreas. Although no discrete pancreatic mass is confidently identified, the possibility of a very small ductal or periductal lesion is not excluded. Further evaluation with endoscopic ultrasound is recommended in the near future to exclude underlying neoplasm. 2. Cholelithiasis without evidence of acute cholecystitis. No choledocholithiasis is noted at this time. 3. 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 4. Ventral hernia containing a portion of the transverse  colon incompletely imaged. Electronically  Signed   By: Vinnie Langton M.D.   On: 09/07/2020 19:10   US Abdomen Limited  Result Date: 09/07/2020 CLINICAL DATA:  Acute right upper quadrant abdominal pain. EXAM: ULTRASOUND ABDOMEN LIMITED RIGHT UPPER QUADRANT COMPARISON:  Aug 30, 2020.  Sep 05, 2016. FINDINGS: Gallbladder: Cholelithiasis is noted without significant gallbladder wall thickening or pericholecystic fluid. No sonographic Murphy's sign is noted. Common bile duct: Diameter: 8 mm which is mildly dilated. Liver: 1.8 cm cyst is seen in left hepatic lobe. Mildly increased echogenicity of hepatic parenchyma is noted suggesting hepatic steatosis. Portal vein is patent on color Doppler imaging with normal direction of blood flow towards the liver. Other: None. IMPRESSION: Cholelithiasis is noted without evidence of cholecystitis. Common bile duct is mildly dilated at 8 mm. Correlation with liver function tests is recommended to rule out distal common bile duct obstruction. MRCP may be performed for further evaluation. Possible hepatic steatosis.  Left hepatic cyst. Electronically Signed   By: Marijo Conception M.D.   On: 09/07/2020 12:27   MR ABDOMEN MRCP W WO CONTAST  Result Date: 09/07/2020 CLINICAL DATA:  55 year old female with history of right upper quadrant abdominal pain. Common bile duct dilatation noted on ultrasound examination. Follow-up study. EXAM: MRI ABDOMEN WITHOUT AND WITH CONTRAST (INCLUDING MRCP) TECHNIQUE: Multiplanar multisequence MR imaging of the abdomen was performed both before and after the administration of intravenous contrast. Heavily T2-weighted images of the biliary and pancreatic ducts were obtained, and three-dimensional MRCP images were rendered by post processing. CONTRAST:  28mL GADAVIST GADOBUTROL 1 MMOL/ML IV SOLN COMPARISON:  No prior abdominal MRI. Abdominal ultrasound 09/07/2020. FINDINGS: Lower chest: Unremarkable. Hepatobiliary: In segment 2 of the liver (axial image 10 of series 15) there is a 2.3 x 2.0  cm T1 hypointense, T2 hyperintense lesion which demonstrates early peripheral nodular hyperenhancement with centripetal filling on delayed images, diagnostic of a cavernous hemangioma. No other suspicious cystic or solid hepatic lesions. Moderate intrahepatic biliary ductal dilatation noted on MRCP images. Common bile duct is dilated measuring 14 mm in the porta hepatis. Abrupt cut off of the common bile duct (coronal MRCP image 35 of series 8), favored to reflect a stricture. No definite filling defect within the common bile duct to suggest choledocholithiasis. There are numerous filling defects within the gallbladder, compatible with tiny gallstones. Gallbladder is moderately distended. Gallbladder wall thickness is normal. No pericholecystic fluid or surrounding inflammatory changes. Pancreas: Subtle increased enhancement is noted in the head of the pancreas immediately inferior to the apparent stricture in the common bile duct, best appreciated on axial images 40 7-49 of series 25. No clearly definable mass is evident in this region, however, ductal lesion or periductal lesion is difficult to entirely exclude. No pancreatic ductal dilatation noted on MRCP images. No peripancreatic fluid collections or inflammatory changes. Spleen: Lobular contour of the lateral aspect of the spleen, potentially sequela of prior trauma or prior infarctions. Adrenals/Urinary Tract: Bilateral kidneys and adrenal glands are normal in appearance. No hydroureteronephrosis in the visualized portions of the abdomen. Stomach/Bowel: Ventral hernia containing a portion of the transverse colon, incompletely imaged. Visualized portions are otherwise unremarkable. Vascular/Lymphatic: No aneurysm identified in the visualized abdominal vasculature. No lymphadenopathy noted in the abdomen. Other: No significant volume of ascites noted in the visualized portions of the peritoneal cavity. Musculoskeletal: No aggressive appearing osseous lesions  are noted in the visualized portions of the skeleton. IMPRESSION: 1. Moderate intra and extrahepatic biliary ductal dilatation with what appears  to be a stricture of the mid common bile duct. Notably, there are some areas of mildly increased enhancement adjacent to the mid to distal common bile duct in the head of the pancreas. Although no discrete pancreatic mass is confidently identified, the possibility of a very small ductal or periductal lesion is not excluded. Further evaluation with endoscopic ultrasound is recommended in the near future to exclude underlying neoplasm. 2. Cholelithiasis without evidence of acute cholecystitis. No choledocholithiasis is noted at this time. 3. 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 4. Ventral hernia containing a portion of the transverse colon incompletely imaged. Electronically Signed   By: Vinnie Langton M.D.   On: 09/07/2020 19:10   IR BILIARY DRAIN PLACEMENT WITH CHOLANGIOGRAM  Result Date: 09/08/2020 INDICATION: Obstructive jaundice of uncertain etiology. Patient is not a candidate for ERCP given history of gastric bypass. As such, request made for placement of an image guided percutaneous biliary drainage catheter for infection source control purposes. EXAM: ULTRASOUND AND FLUOROSCOPIC GUIDED PERCUTANEOUS TRANSHEPATIC CHOLANGIOGRAM AND BILIARY TUBE PLACEMENT COMPARISON:  MRCP-09/07/2020; right upper quadrant abdominal ultrasound-09/07/2020 MEDICATIONS: Zosyn 3.375 g IV; The antibiotic was administered with an appropriate time frame prior to the initiation of the procedure CONTRAST:  40 cc Isovue-300 - administered into the biliary tree. ANESTHESIA/SEDATION: Moderate (conscious) sedation was employed during this procedure. A total of Versed 8 mg, Dilaudid 1 mg and Fentanyl 300 mcg was administered intravenously. Moderate Sedation Time: 60 minutes. The patient's level of consciousness and vital signs were monitored continuously by  radiology nursing throughout the procedure under my direct supervision. FLUOROSCOPY TIME:  15 minutes, 42 seconds (623 mGy) COMPLICATIONS: None immediate. TECHNIQUE: Informed written consent was obtained from the patient after a discussion of the risks, benefits and alternatives to treatment. Questions regarding the procedure were encouraged and answered. A timeout was performed prior to the initiation of the procedure. The right upper abdominal quadrant was prepped and draped in the usual sterile fashion, and a sterile drape was applied covering the operative field. Maximum barrier sterile technique with sterile gowns and gloves were used for the procedure. A timeout was performed prior to the initiation of the procedure. Ultrasound scanning of the right upper abdominal quadrant was performed to delineate the anatomy and avoid transgression of the gallbladder or the pleural. A spot along the right mid axillary line was marked fluoroscopically inferior to the right costophrenic angle. After the overlying soft tissues were anesthetized with 1% Lidocaine with epinephrine, under direct ultrasound guidance, a 22 gauge Chiba needle was utilized to attempt to cannulate a peripheral duct within the right intrahepatic biliary tree. Ultimately, this proved challenging given lack of significant dilatation of the peripheral aspect of the biliary tree as well as patient body habitus and associated poor sonographic window. Ultimately, the central aspect of the right intrahepatic biliary tree was successfully cannulated with the inner 3 French catheter from the [place] set allowing opacification of smaller peripheral ducts. Next, under direct fluoroscopic guidance, an additional 22 gauge Chiba needle was utilized to access a nondilated peripheral duct within the right intrahepatic biliary tree. Appropriate access was confirmed with advancement of a Nitrex wire centrally. The track was dilated with the remainder of the [place]  set. Next, a 4 French angled glide catheter was advanced through the outer sheath of the [place] set and with the use of a regular glidewire, advanced through the biliary hilum, common bile duct and ampulla to the level of the duodenum. Contrast injection  confirmed appropriate positioning. Under intermittent fluoroscopic guidance and over an Amplatz wire, the track was dilated ultimately allowing placement of a 10 [place] biliary drainage catheter with coil ultimately locked within the duodenum. Contrast was injected and a completion radiographs were obtained in various obliquities. The catheter was connected to a drainage bag which yielded the brisk return of dark bile. The catheter was secured to the skin with an interrupted suture and StatLock device. Dressings were applied. The patient tolerated the procedure well without immediate postprocedural complication. FINDINGS: Sonographic evaluation of the liver demonstrates dilatation of the central aspect of the biliary tree with lack of significant dilatation peripherally. Initially, the central aspect of the right biliary tree was accessed allowing opacification of the smaller peripheral ducts. Next, utilizing a two stick technique, the peripheral aspect of the right biliary tree was successfully accessed allowing placement of a 10 [place] biliary drainage catheter with end ultimately coiled and locked within the duodenum and radiopaque side marker located at the level of the biliary hilum. Limited contrast injection demonstrates tapered narrowing/subtotal occlusion involving the mid/distal aspect of the CBD (series 5). IMPRESSION: Successful placement of a 10.2 French percutaneous biliary drainage catheter with end coiled and locked within the duodenum. PLAN: Recommend obtaining daily LFTs. Once patient's bilirubin has reached a nadir, the patient may return for definitive cholangiogram and potential fluoroscopic guided biliary brush biopsy as indicated.  Electronically Signed   By: Sandi Mariscal M.D.   On: 09/08/2020 15:35       Subjective: Patient seen and examined at bedside.  Denies worsening abdominal pain.  Tolerating diet.  Discharge Exam: Vitals:   09/14/20 2115 09/15/20 0604  BP: (!) 147/90 (!) 144/79  Pulse: 87 92  Resp: 16 16  Temp: 99 F (37.2 C) 98.6 F (37 C)  SpO2: 97% 94%    General: Pt is alert, awake, not in acute distress Cardiovascular: rate controlled, S1/S2 + Respiratory: bilateral decreased breath sounds at bases Abdominal: Soft, obese,, ND, bowel sounds +, percutaneous drain present with biliary drainage Extremities: no edema, no cyanosis Skin:Generalized urticarial rash throughout the body present    The results of significant diagnostics from this hospitalization (including imaging, microbiology, ancillary and laboratory) are listed below for reference.     Microbiology: Recent Results (from the past 240 hour(s))  Urine Culture     Status: None   Collection Time: 09/07/20  9:25 AM   Specimen: Urine  Result Value Ref Range Status   MICRO NUMBER: [id number]  Final   SPECIMEN QUALITY: Adequate  Final   Sample Source NOT GIVEN  Final   STATUS: FINAL  Final   Result:   Final    Mixed genital flora isolated. These superficial bacteria are not indicative of a urinary tract infection. No further organism identification is warranted on this specimen. If clinically indicated, recollect clean-catch, mid-stream urine and transfer  immediately to Urine Culture Transport Tube.   SARS CORONAVIRUS 2 (TAT 6-24 HRS) Nasopharyngeal Nasopharyngeal Swab     Status: None   Collection Time: 09/07/20  5:28 PM   Specimen: Nasopharyngeal Swab  Result Value Ref Range Status   SARS Coronavirus 2 NEGATIVE NEGATIVE Final    Comment: (NOTE) SARS-CoV-2 target nucleic acids are NOT DETECTED.  The SARS-CoV-2 RNA is generally detectable in upper and lower respiratory specimens during the acute phase of infection.  Negative results do not preclude SARS-CoV-2 infection, do not rule out co-infections with other pathogens, and should not be used as the sole basis for  treatment or other patient management decisions. Negative results must be combined with clinical observations, patient history, and epidemiological information. The expected result is Negative.  Fact Sheet for Patients: SugarRoll.be  Fact Sheet for Healthcare Providers: https://www.woods-mathews.com/  This test is not yet approved or cleared by the [place] FDA and  has been authorized for detection and/or diagnosis of SARS-CoV-2 by FDA under an Emergency Use Authorization (EUA). This EUA will remain  in effect (meaning this test can be used) for the duration of the COVID-19 declaration under Se ction 564(b)(1) of the Act, 21 U.S.C. section 360bbb-3(b)(1), unless the authorization is terminated or revoked sooner.  Performed at [facility], [place] [address] [place], [place] [address]   Resp Panel by RT-PCR (Flu A&B, Covid) Nasopharyngeal Swab     Status: None   Collection Time: 09/12/20  7:39 PM   Specimen: Nasopharyngeal Swab; Nasopharyngeal(NP) swabs in vial transport medium  Result Value Ref Range Status   SARS Coronavirus 2 by RT PCR NEGATIVE NEGATIVE Final    Comment: (NOTE) SARS-CoV-2 target nucleic acids are NOT DETECTED.  The SARS-CoV-2 RNA is generally detectable in upper respiratory specimens during the acute phase of infection. The lowest concentration of SARS-CoV-2 viral copies this assay can detect is 138 copies/mL. A negative result does not preclude SARS-Cov-2 infection and should not be used as the sole basis for treatment or other patient management decisions. A negative result may occur with  improper specimen collection/handling, submission of specimen other than nasopharyngeal swab, presence of viral mutation(s) within the areas targeted by this assay, and  inadequate number of viral copies(<138 copies/mL). A negative result must be combined with clinical observations, patient history, and epidemiological information. The expected result is Negative.  Fact Sheet for Patients:  EntrepreneurPulse.com.au  Fact Sheet for Healthcare Providers:  IncredibleEmployment.be  This test is no t yet approved or cleared by the [place] FDA and  has been authorized for detection and/or diagnosis of SARS-CoV-2 by FDA under an Emergency Use Authorization (EUA). This EUA will remain  in effect (meaning this test can be used) for the duration of the COVID-19 declaration under Section 564(b)(1) of the Act, 21 U.S.C.section 360bbb-3(b)(1), unless the authorization is terminated  or revoked sooner.       Influenza A by PCR NEGATIVE NEGATIVE Final   Influenza B by PCR NEGATIVE NEGATIVE Final    Comment: (NOTE) The Xpert Xpress SARS-CoV-2/FLU/RSV plus assay is intended as an aid in the diagnosis of influenza from Nasopharyngeal swab specimens and should not be used as a sole basis for treatment. Nasal washings and aspirates are unacceptable for Xpert Xpress SARS-CoV-2/FLU/RSV testing.  Fact Sheet for Patients: EntrepreneurPulse.com.au  Fact Sheet for Healthcare Providers: IncredibleEmployment.be  This test is not yet approved or cleared by the [place] FDA and has been authorized for detection and/or diagnosis of SARS-CoV-2 by FDA under an Emergency Use Authorization (EUA). This EUA will remain in effect (meaning this test can be used) for the duration of the COVID-19 declaration under Section 564(b)(1) of the Act, 21 U.S.C. section 360bbb-3(b)(1), unless the authorization is terminated or revoked.  Performed at [facility], [place] [address] [place], [place] [address]      Labs: BNP (last 3 results) No results for input(s): BNP in the last 8760  hours. Basic Metabolic Panel: Recent Labs  Lab 09/09/20 0424 09/12/20 1510 09/13/20 0442 09/14/20 0356 09/15/20 0437  NA 138 140 138 137 140  K 3.8 3.5 4.4 4.3  3.2*  CL 105 107 107 106 106  CO2 25 23 20* 24 25  GLUCOSE 145* 141* 124* 120* 106*  BUN 6 11 12 8 12   CREATININE 0.37* 0.50 0.62 0.60 0.53  CALCIUM 9.0 8.9 9.2 8.3* 8.1*  MG  --   --  1.8 2.0 1.8   Liver Function Tests: Recent Labs  Lab 09/10/20 0458 09/12/20 1510 09/13/20 0442 09/14/20 0356 09/15/20 0437  AST 63* 29 28 28  10*  ALT 254* 167* 125* 77* 53*  ALKPHOS 560* 515* 421* 321* 262*  BILITOT 2.4* 2.2* 2.1* 2.5* 1.5*  PROT 5.7* 7.1 6.3* 5.5* 5.3*  ALBUMIN 2.9* 3.5 2.9* 2.4* 2.2*   Recent Labs  Lab 09/12/20 1510  LIPASE 17   No results for input(s): AMMONIA in the last 168 hours. CBC: Recent Labs  Lab 09/12/20 1510 09/13/20 0442 09/14/20 0356 09/15/20 0437  WBC 11.3* 11.0* 9.4 10.5  NEUTROABS 9.9*  --   --   --   HGB 13.2 13.4 11.7* 11.5*  HCT 38.8 38.2 34.0* 34.6*  MCV 97.2 97.2 97.7 99.1  PLT 341 267 332 308   Cardiac Enzymes: No results for input(s): CKTOTAL, CKMB, CKMBINDEX, TROPONINI in the last 168 hours. BNP: Invalid input(s): POCBNP CBG: No results for input(s): GLUCAP in the last 168 hours. D-Dimer No results for input(s): DDIMER in the last 72 hours. Hgb A1c No results for input(s): HGBA1C in the last 72 hours. Lipid Profile No results for input(s): CHOL, HDL, LDLCALC, TRIG, CHOLHDL, LDLDIRECT in the last 72 hours. Thyroid function studies No results for input(s): TSH, T4TOTAL, T3FREE, THYROIDAB in the last 72 hours.  Invalid input(s): FREET3 Anemia work up No results for input(s): VITAMINB12, FOLATE, FERRITIN, TIBC, IRON, RETICCTPCT in the last 72 hours. Urinalysis    Component Value Date/Time   COLORURINE AMBER (A) 09/07/2020 1355   APPEARANCEUR HAZY (A) 09/07/2020 1355   LABSPEC 1.020 09/07/2020 1355   PHURINE 5.0 09/07/2020 1355   GLUCOSEU NEGATIVE 09/07/2020 1355    HGBUR NEGATIVE 09/07/2020 1355   BILIRUBINUR SMALL (A) 09/07/2020 1355   BILIRUBINUR moderate (A) 09/07/2020 0905   KETONESUR 20 (A) 09/07/2020 1355   PROTEINUR 30 (A) 09/07/2020 1355   UROBILINOGEN 4.0 (A) 09/07/2020 0905   NITRITE NEGATIVE 09/07/2020 1355   LEUKOCYTESUR NEGATIVE 09/07/2020 1355   Sepsis Labs Invalid input(s): PROCALCITONIN,  WBC,  LACTICIDVEN Microbiology Recent Results (from the past 240 hour(s))  Urine Culture     Status: None   Collection Time: 09/07/20  9:25 AM   Specimen: Urine  Result Value Ref Range Status   MICRO NUMBER: [id number]  Final   SPECIMEN QUALITY: Adequate  Final   Sample Source NOT GIVEN  Final   STATUS: FINAL  Final   Result:   Final    Mixed genital flora isolated. These superficial bacteria are not indicative of a urinary tract infection. No further organism identification is warranted on this specimen. If clinically indicated, recollect clean-catch, mid-stream urine and transfer  immediately to Urine Culture Transport Tube.   SARS CORONAVIRUS 2 (TAT 6-24 HRS) Nasopharyngeal Nasopharyngeal Swab     Status: None   Collection Time: 09/07/20  5:28 PM   Specimen: Nasopharyngeal Swab  Result Value Ref Range Status   SARS Coronavirus 2 NEGATIVE NEGATIVE Final    Comment: (NOTE) SARS-CoV-2 target nucleic acids are NOT DETECTED.  The SARS-CoV-2 RNA is generally detectable in upper and lower respiratory specimens during the acute phase of infection. Negative results do not preclude SARS-CoV-2  infection, do not rule out co-infections with other pathogens, and should not be used as the sole basis for treatment or other patient management decisions. Negative results must be combined with clinical observations, patient history, and epidemiological information. The expected result is Negative.  Fact Sheet for Patients: SugarRoll.be  Fact Sheet for Healthcare  Providers: https://www.woods-mathews.com/  This test is not yet approved or cleared by the [place] FDA and  has been authorized for detection and/or diagnosis of SARS-CoV-2 by FDA under an Emergency Use Authorization (EUA). This EUA will remain  in effect (meaning this test can be used) for the duration of the COVID-19 declaration under Se ction 564(b)(1) of the Act, 21 U.S.C. section 360bbb-3(b)(1), unless the authorization is terminated or revoked sooner.  Performed at [facility], [place] [address], [place], [place] [address]   Resp Panel by RT-PCR (Flu A&B, Covid) Nasopharyngeal Swab     Status: None   Collection Time: 09/12/20  7:39 PM   Specimen: Nasopharyngeal Swab; Nasopharyngeal(NP) swabs in vial transport medium  Result Value Ref Range Status   SARS Coronavirus 2 by RT PCR NEGATIVE NEGATIVE Final    Comment: (NOTE) SARS-CoV-2 target nucleic acids are NOT DETECTED.  The SARS-CoV-2 RNA is generally detectable in upper respiratory specimens during the acute phase of infection. The lowest concentration of SARS-CoV-2 viral copies this assay can detect is 138 copies/mL. A negative result does not preclude SARS-Cov-2 infection and should not be used as the sole basis for treatment or other patient management decisions. A negative result may occur with  improper specimen collection/handling, submission of specimen other than nasopharyngeal swab, presence of viral mutation(s) within the areas targeted by this assay, and inadequate number of viral copies(<138 copies/mL). A negative result must be combined with clinical observations, patient history, and epidemiological information. The expected result is Negative.  Fact Sheet for Patients:  EntrepreneurPulse.com.au  Fact Sheet for Healthcare Providers:  IncredibleEmployment.be  This test is no t yet approved or cleared by the [place] FDA and  has been authorized  for detection and/or diagnosis of SARS-CoV-2 by FDA under an Emergency Use Authorization (EUA). This EUA will remain  in effect (meaning this test can be used) for the duration of the COVID-19 declaration under Section 564(b)(1) of the Act, 21 U.S.C.section 360bbb-3(b)(1), unless the authorization is terminated  or revoked sooner.       Influenza A by PCR NEGATIVE NEGATIVE Final   Influenza B by PCR NEGATIVE NEGATIVE Final    Comment: (NOTE) The Xpert Xpress SARS-CoV-2/FLU/RSV plus assay is intended as an aid in the diagnosis of influenza from Nasopharyngeal swab specimens and should not be used as a sole basis for treatment. Nasal washings and aspirates are unacceptable for Xpert Xpress SARS-CoV-2/FLU/RSV testing.  Fact Sheet for Patients: EntrepreneurPulse.com.au  Fact Sheet for Healthcare Providers: IncredibleEmployment.be  This test is not yet approved or cleared by the [place] FDA and has been authorized for detection and/or diagnosis of SARS-CoV-2 by FDA under an Emergency Use Authorization (EUA). This EUA will remain in effect (meaning this test can be used) for the duration of the COVID-19 declaration under Section 564(b)(1) of the Act, 21 U.S.C. section 360bbb-3(b)(1), unless the authorization is terminated or revoked.  Performed at [facility], [place] [address], [place], [place] [address]      Time coordinating discharge: 35 minutes  SIGNED:   Aline August, MD  [facility] 09/15/2020, 10:00 AM

## 2020-09-17 ENCOUNTER — Other Ambulatory Visit (HOSPITAL_COMMUNITY): Payer: Self-pay | Admitting: Interventional Radiology

## 2020-09-17 ENCOUNTER — Telehealth: Payer: Self-pay

## 2020-09-17 ENCOUNTER — Encounter (HOSPITAL_COMMUNITY): Payer: Self-pay | Admitting: Gastroenterology

## 2020-09-17 DIAGNOSIS — K831 Obstruction of bile duct: Secondary | ICD-10-CM

## 2020-09-17 NOTE — Telephone Encounter (Signed)
Transition Care Management Follow-up Telephone Call  Date of discharge and from where: 09/15/20-[facility]  How have you been since you were released from the hospital? Doing ok. Still having some discomfort.  Any questions or concerns? No  Items Reviewed:  Did the pt receive and understand the discharge instructions provided? Yes   Medications obtained and verified? Yes   Other? Yes   Any new allergies since your discharge? No   Dietary orders reviewed? Yes  Do you have support at home? No but daughter just lives 1-2 miles away  [place] and Equipment/Supplies: Were home health services ordered? no If so, what is the name of the agency? n/a  Has the agency set up a time to come to the patient's home? not applicable Were any new equipment or medical supplies ordered?  No What is the name of the medical supply agency? n/a Were you able to get the supplies/equipment? not applicable Do you have any questions related to the use of the equipment or supplies? In/a  Functional Questionnaire: (I = Independent and D = Dependent) ADLs: I  Bathing/Dressing- I  Meal Prep- I  Eating- I  Maintaining continence- I  Transferring/Ambulation- I  Managing Meds- I  Follow up appointments reviewed:   PCP Hospital f/u appt confirmed? Yes  Scheduled to see Jodi Mourning on 09/18/20 @ 1:20.  [facility] f/u appt confirmed? No  Patient to call GI to schedule  Are transportation arrangements needed? No   If their condition worsens, is the pt aware to call PCP or go to the Emergency Dept.? Yes  Was the patient provided with contact information for the PCP's office or ED? Yes  Was to pt encouraged to call back with questions or concerns? Yes

## 2020-09-18 ENCOUNTER — Other Ambulatory Visit: Payer: Self-pay

## 2020-09-18 ENCOUNTER — Encounter: Payer: Self-pay | Admitting: Family

## 2020-09-18 ENCOUNTER — Ambulatory Visit (INDEPENDENT_AMBULATORY_CARE_PROVIDER_SITE_OTHER): Payer: 59 | Admitting: Family

## 2020-09-18 ENCOUNTER — Other Ambulatory Visit: Payer: Self-pay | Admitting: Allergy

## 2020-09-18 VITALS — BP 118/78 | HR 86 | Temp 97.8°F | Resp 18 | Ht 69.0 in | Wt 255.0 lb

## 2020-09-18 DIAGNOSIS — R109 Unspecified abdominal pain: Secondary | ICD-10-CM

## 2020-09-18 NOTE — Progress Notes (Signed)
Brittany [name] is a 55 y.o. female with the following history as recorded in EpicCare:  Patient Active Problem List   Diagnosis Date Noted  . Abdominal pain 09/13/2020  . Biliary obstruction 09/12/2020  . Obstructive jaundice 09/08/2020  . Elevated LFTs 09/08/2020  . Choledocholithiasis 09/07/2020  . Urticaria   . Ulcer, stomach peptic   . Sleep apnea   . Positive TB test   . Migraines   . Hypercholesterolemia   . History of chicken pox   . H/O hiatal hernia   . Blood transfusion without reported diagnosis   . Arthritis   . Anxiety   . Angio-edema   . Allergy   . Allergic contact dermatitis 07/09/2020  . Chronic idiopathic urticaria 01/17/2019  . Seasonal and perennial allergic rhinoconjunctivitis 11/08/2018  . Allergic reaction 11/08/2018  . Diarrhea 11/08/2018  . Right thigh pain 05/19/2018  . Melena   . Acute gastrojejunal anastomotic ulcer   . Macrocytic anemia   . Acute GI bleeding 09/28/2017  . Epigastric abdominal pain 11/19/2016  . Incisional hernia with obstruction but no gangrene 07/21/2016  . Menopause 04/22/2016  . Routine general medical examination at a health care facility 03/18/2016  . Obesity 03/18/2016  . Severe episode of recurrent major depressive disorder, without psychotic features ([place])   . Depression, major, severe recurrence ([place]) 09/11/2015  . Major depressive disorder, recurrent, severe without psychotic features ([place])   . MDD (major depressive disorder) 10/30/2014  . MDD (major depressive disorder), recurrent severe, without psychosis ([place])   . Suicidal ideation 10/05/2014  . Depression 06/12/2014  . Rash 01/04/2014  . Normocytic anemia 01/04/2014  . DJD (degenerative joint disease) 01/04/2014  . Hypertension 01/04/2014  . Dyslipidemia 01/04/2014  . GERD (gastroesophageal reflux disease) 01/04/2014  . Obstructive sleep apnea 01/04/2014  . Status post small bowel resection 01/04/2014  . Abdominal abscess 01/01/2014  . Wound infection  after surgery 01/01/2014  . PTSD (post-traumatic stress disorder) 11/23/2013  . Severe recurrent major depression without psychotic features ([place]) 11/22/2013  . 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 Morbid obesity, Weight - 312, BMI - 45.2 04/21/2012    Current Outpatient Medications  Medication Sig Dispense Refill  . calcium-vitamin D (OSCAL WITH D) 500-200 MG-UNIT tablet Take 1 tablet by mouth every evening. For bone health    . cetirizine (ZYRTEC) 10 MG tablet Take 10 mg by mouth 2 (two) times daily.    . cholecalciferol (VITAMIN D) 1000 units tablet Take 1 tablet (1,000 Units total) by mouth daily. For bone health    . DULoxetine (CYMBALTA) 60 MG capsule Take 1 capsule (60 mg total) by mouth 2 (two) times daily. 180 capsule 0  . EPINEPHrine (EPIPEN 2-PAK) 0.3 mg/0.3 mL IJ SOAJ injection Inject 0.3 mg into the muscle as needed for anaphylaxis. 0.3 mL 1  . famotidine (PEPCID) 20 MG tablet Take 1 tablet (20 mg total) by mouth daily.    . metoprolol succinate (TOPROL XL) 25 MG 24 hr tablet Take 1 tablet (25 mg total) by mouth daily. 90 tablet 3  . montelukast (SINGULAIR) 10 MG tablet TAKE 1 TABLET(10 MG) BY MOUTH AT BEDTIME 30 tablet 1  . Multiple Vitamin (MULTI-VITAMIN) tablet Take 1 tablet by mouth daily.    . QUEtiapine (SEROQUEL) 50 MG tablet TAKE 3 TABLETS(150 MG) BY MOUTH AT BEDTIME (Patient taking differently: Take 150 mg by mouth at bedtime.) 90 tablet 2  . vitamin B-12 (CYANOCOBALAMIN) 500 MCG tablet Take 1 tablet (500 mcg  total) by mouth daily. For Vitamin B-12 replacement (Patient taking differently: Take 500 mcg by mouth daily.)    . vitamin E 1000 UNIT capsule Take 1,000 Units by mouth daily.    Arvid Right 150 MG injection INJECT 300 MG UNDER THE SKIN EVERY 14 DAYS (Patient taking differently: Inject 150 mg into the skin every 28 (twenty-eight) days.) 4 each 11   No current facility-administered medications for this visit.    Allergies: Contrast media  [iodinated diagnostic agents], Amoxicillin, Bactrim [sulfamethoxazole-trimethoprim], and Lamictal [lamotrigine]  Past Medical History:  Diagnosis Date  . Allergy   . Angio-edema   . Anxiety   . Arthritis    hips, knees and hands   . Blood transfusion without reported diagnosis   . Depression   . GERD (gastroesophageal reflux disease)    hx of   . H/O hiatal hernia   . History of chicken pox   . Hypercholesterolemia   . Hypertension   . Migraine   . Migraines   . Morbid obesity ([place])   . Positive TB test   . PTSD (post-traumatic stress disorder)   . Sleep apnea    no CPAP machine   . Ulcer, stomach peptic   . Urticaria     Past Surgical History:  Procedure Laterality Date  . ABDOMINAL HYSTERECTOMY  1997 & 2006  . BREATH TEK H PYLORI  05/03/2012   Procedure: BREATH TEK H PYLORI;  Surgeon: [name], MD;  Location: [name];  Service: General;  Laterality: N/A;  . COLONOSCOPY     hx of benign polyps   . ESOPHAGOGASTRODUODENOSCOPY N/A 09/29/2017   Procedure: ESOPHAGOGASTRODUODENOSCOPY (EGD);  Surgeon: Ladene Artist, MD;  Location: [name];  Service: Endoscopy;  Laterality: N/A;  . ESOPHAGOGASTRODUODENOSCOPY N/A 09/14/2020   Procedure: ESOPHAGOGASTRODUODENOSCOPY (EGD);  Surgeon: [name], MD;  Location: [name];  Service: Endoscopy;  Laterality: N/A;  . EUS N/A 09/14/2020   Procedure: UPPER ENDOSCOPIC ULTRASOUND (EUS) RADIAL;  Surgeon: [name], MD;  Location: [facility];  Service: Endoscopy;  Laterality: N/A;  . FOOT SURGERY    . GASTRIC BYPASS    . GASTRIC ROUX-EN-Y N/A 07/05/2012   Procedure: LAPAROSCOPIC ROUX-EN-Y GASTRIC;  Surgeon: [name], MD;  Location: [facility];  Service: General;  Laterality: N/A;  . IR BILIARY DRAIN PLACEMENT WITH CHOLANGIOGRAM  09/08/2020  . IRRIGATION AND DEBRIDEMENT ABSCESS N/A 01/01/2014   Procedure: IRRIGATION AND DEBRIDEMENT ABSCESS;  Surgeon: [name], MD;  Location: [facility];  Service: General;   Laterality: N/A;  . LAPAROSCOPIC LYSIS OF ADHESIONS N/A 07/05/2012   Procedure: LAPAROSCOPIC LYSIS OF ADHESIONS;  Surgeon: [name], MD;  Location: [facility];  Service: General;  Laterality: N/A;  repair of abdominal wall hernia  . right tube and ovary removed     . UPPER GI ENDOSCOPY N/A 07/05/2012   Procedure: UPPER GI ENDOSCOPY;  Surgeon: [name], MD;  Location: [facility];  Service: General;  Laterality: N/A;    Family History  Problem Relation Age of Onset  . Colon cancer Mother 83  . Depression Father   . Diabetes Father   . Heart disease Father   . Bladder Cancer Father   . Depression Sister   . Post-traumatic stress disorder Sister   . Alcohol abuse Paternal Uncle   . Bipolar disorder Cousin   . Anxiety disorder Sister   . Eczema Sister   . Cancer Maternal Aunt        breast  .  Breast cancer Maternal Aunt   . Melanoma Maternal Grandmother   . Colon cancer Maternal Grandmother        does not know age of onset  . Healthy Maternal Grandfather   . Healthy Paternal Grandmother   . Heart disease Paternal Grandfather   . Stroke Paternal Grandfather   . Diabetes Paternal Grandfather     Social History   Tobacco Use  . Smoking status: Former Smoker    Packs/day: 0.10    Years: 1.00    Pack years: 0.10    Types: Cigarettes    Quit date: 04/27/2004    Years since quitting: 16.4  . Smokeless tobacco: Never Used  Substance Use Topics  . Alcohol use: Yes    Alcohol/week: 0.0 standard drinks    Comment: Occasional use/ 2 drinks a month    Subjective:  Follow up from recent hospitalization for biliary duct obstruction; scheduled for procedure with IR on Thursday- there is concern for stricture/ scarring secondary to gastric bypass vs mass/ growth at head of pancreas; notes her pain has been much better but is physically and mentally tired; would be open to considering options for taking some time off from work; also wonders if home health is an option to help with  management of the drain she currently has in place;     Objective:  Vitals:   09/18/20 1326  BP: 118/78  Pulse: 86  Resp: 18  Temp: 97.8 F (36.6 C)  SpO2: 99%  Weight: 255 lb (115.7 kg)  Height: '5\' 9"'  (1.753 m)    General: Well developed, well nourished, in no acute distress  Skin : Warm and dry.  Head: Normocephalic and atraumatic  Eyes: Sclera and conjunctiva clear; pupils round and reactive to light; extraocular movements intact  Lungs: Respirations unlabored; clear to auscultation bilaterally without wheeze, rales, rhonchi  CVS exam: normal rate and regular rhythm.  Neurologic: Alert and oriented; speech intact; face symmetrical; moves all extremities well; CNII-XII intact without focal deficit   Assessment:  1. Abdominal pain, unspecified abdominal location     Plan:  Keep planned follow up with IR and general surgery; check CBC, CMP today;  Agree she needs to talk to HR about her options for taking a leave of absence from work- she will let us know what paperwork needs to be completed. Order for home health placed as well- will need to determine what care will be needed after upcoming IR procedure.  Time spent discussing care/ treatment plan- 30 minutes  This visit occurred during the SARS-CoV-2 public health emergency.  Safety protocols were in place, including screening questions prior to the visit, additional usage of staff PPE, and extensive cleaning of exam room while observing appropriate contact time as indicated for disinfecting solutions.      No follow-ups on file.  Orders Placed This Encounter  Procedures  . CBC with Differential/Platelet  . Comp Met (CMET)  . Ambulatory referral to Home Health    Referral Priority:   Routine    Referral Type:   Home Health Care    Referral Reason:   Specialty Services Required    Requested Specialty:   [place]    Number of Visits Requested:   1    Requested Prescriptions    No prescriptions requested  or ordered in this encounter

## 2020-09-19 ENCOUNTER — Other Ambulatory Visit: Payer: Self-pay | Admitting: Radiology

## 2020-09-19 LAB — COMPREHENSIVE METABOLIC PANEL
ALT: 30 U/L (ref 0–35)
AST: 9 U/L (ref 0–37)
Albumin: 3 g/dL — ABNORMAL LOW (ref 3.5–5.2)
Alkaline Phosphatase: 299 U/L — ABNORMAL HIGH (ref 39–117)
BUN: 6 mg/dL (ref 6–23)
CO2: 25 mEq/L (ref 19–32)
Calcium: 8.5 mg/dL (ref 8.4–10.5)
Chloride: 103 mEq/L (ref 96–112)
Creatinine, Ser: 0.47 mg/dL (ref 0.40–1.20)
GFR: 106.97 mL/min (ref >60.00)
Glucose, Bld: 106 mg/dL — ABNORMAL HIGH (ref 70–99)
Potassium: 3.2 mEq/L — ABNORMAL LOW (ref 3.5–5.1)
Sodium: 141 mEq/L (ref 135–145)
Total Bilirubin: 1.2 mg/dL (ref 0.2–1.2)
Total Protein: 6.2 g/dL (ref 6.0–8.3)

## 2020-09-19 LAB — CBC WITH DIFFERENTIAL/PLATELET
Basophils Absolute: 0.1 10*3/uL (ref 0.0–0.1)
Basophils Relative: 0.8 % (ref 0.0–3.0)
Eosinophils Absolute: 0.6 10*3/uL (ref 0.0–0.7)
Eosinophils Relative: 5.6 % — ABNORMAL HIGH (ref 0.0–5.0)
HCT: 36.5 % (ref 36.0–46.0)
Hemoglobin: 12.4 g/dL (ref 12.0–15.0)
Lymphocytes Relative: 13.6 % (ref 12.0–46.0)
Lymphs Abs: 1.5 10*3/uL (ref 0.7–4.0)
MCHC: 33.8 g/dL (ref 30.0–36.0)
MCV: 97.5 fl (ref 78.0–100.0)
Monocytes Absolute: 1 10*3/uL (ref 0.1–1.0)
Monocytes Relative: 8.7 % (ref 3.0–12.0)
Neutro Abs: 8 10*3/uL — ABNORMAL HIGH (ref 1.4–7.7)
Neutrophils Relative %: 71.3 % (ref 43.0–77.0)
Platelets: 494 10*3/uL — ABNORMAL HIGH (ref 150.0–400.0)
RBC: 3.75 Mil/uL — ABNORMAL LOW (ref 3.87–5.11)
RDW: 13.1 % (ref 11.5–15.5)
WBC: 11.2 10*3/uL — ABNORMAL HIGH (ref 4.0–10.5)

## 2020-09-19 LAB — ANTINUCLEAR ANTIBODIES, IFA: ANA Ab, IFA: NEGATIVE

## 2020-09-20 ENCOUNTER — Ambulatory Visit: Payer: Self-pay

## 2020-09-20 ENCOUNTER — Ambulatory Visit (HOSPITAL_COMMUNITY)
Admission: RE | Admit: 2020-09-20 | Discharge: 2020-09-20 | Disposition: A | Discharge status: Home / Self Care | Payer: 59 | Source: Ambulatory Visit | Attending: Interventional Radiology | Admitting: Interventional Radiology

## 2020-09-20 ENCOUNTER — Other Ambulatory Visit (HOSPITAL_COMMUNITY): Payer: Self-pay | Admitting: Interventional Radiology

## 2020-09-20 ENCOUNTER — Telehealth: Payer: Self-pay

## 2020-09-20 ENCOUNTER — Ambulatory Visit (HOSPITAL_COMMUNITY)
Admission: RE | Admit: 2020-09-20 | Discharge: 2020-09-20 | Disposition: A | Discharge status: Home / Self Care | Payer: 59 | Source: Ambulatory Visit

## 2020-09-20 ENCOUNTER — Encounter (HOSPITAL_COMMUNITY): Payer: Self-pay

## 2020-09-20 ENCOUNTER — Other Ambulatory Visit: Payer: Self-pay | Admitting: Family

## 2020-09-20 ENCOUNTER — Other Ambulatory Visit: Payer: Self-pay

## 2020-09-20 DIAGNOSIS — K831 Obstruction of bile duct: Secondary | ICD-10-CM | POA: Diagnosis present

## 2020-09-20 DIAGNOSIS — Z91041 Radiographic dye allergy status: Secondary | ICD-10-CM | POA: Insufficient documentation

## 2020-09-20 DIAGNOSIS — Z881 Allergy status to other antibiotic agents status: Secondary | ICD-10-CM | POA: Insufficient documentation

## 2020-09-20 DIAGNOSIS — Z888 Allergy status to other drugs, medicaments and biological substances status: Secondary | ICD-10-CM | POA: Diagnosis not present

## 2020-09-20 DIAGNOSIS — R978 Other abnormal tumor markers: Secondary | ICD-10-CM | POA: Diagnosis not present

## 2020-09-20 DIAGNOSIS — Z882 Allergy status to sulfonamides status: Secondary | ICD-10-CM | POA: Diagnosis not present

## 2020-09-20 DIAGNOSIS — Z79899 Other long term (current) drug therapy: Secondary | ICD-10-CM | POA: Insufficient documentation

## 2020-09-20 HISTORY — PX: IR ENDOLUMINAL BX OF BILIARY TREE: IMG6053

## 2020-09-20 HISTORY — PX: IR EXCHANGE BILIARY DRAIN: IMG6046

## 2020-09-20 LAB — CBC WITH DIFFERENTIAL/PLATELET
Abs Immature Granulocytes: 0.2 10*3/uL — ABNORMAL HIGH (ref 0.00–0.07)
Basophils Absolute: 0 10*3/uL (ref 0.0–0.1)
Basophils Relative: 0 %
Eosinophils Absolute: 0 10*3/uL (ref 0.0–0.5)
Eosinophils Relative: 0 %
HCT: 35 % — ABNORMAL LOW (ref 36.0–46.0)
Hemoglobin: 12 g/dL (ref 12.0–15.0)
Immature Granulocytes: 2 %
Lymphocytes Relative: 13 %
Lymphs Abs: 1 10*3/uL (ref 0.7–4.0)
MCH: 33.1 pg (ref 26.0–34.0)
MCHC: 34.3 g/dL (ref 30.0–36.0)
MCV: 96.4 fL (ref 80.0–100.0)
Monocytes Absolute: 0.1 10*3/uL (ref 0.1–1.0)
Monocytes Relative: 1 %
Neutro Abs: 6.9 10*3/uL (ref 1.7–7.7)
Neutrophils Relative %: 84 %
Platelets: 472 10*3/uL — ABNORMAL HIGH (ref 150–400)
RBC: 3.63 MIL/uL — ABNORMAL LOW (ref 3.87–5.11)
RDW: 12.7 % (ref 11.5–15.5)
WBC: 8.3 10*3/uL (ref 4.0–10.5)
nRBC: 0 % (ref 0.0–0.2)

## 2020-09-20 LAB — COMPREHENSIVE METABOLIC PANEL
ALT: 27 U/L (ref 0–44)
AST: 14 U/L — ABNORMAL LOW (ref 15–41)
Albumin: 2.6 g/dL — ABNORMAL LOW (ref 3.5–5.0)
Alkaline Phosphatase: 250 U/L — ABNORMAL HIGH (ref 38–126)
Anion gap: 11 (ref 5–15)
BUN: 6 mg/dL (ref 6–20)
CO2: 26 mmol/L (ref 22–32)
Calcium: 8.8 mg/dL — ABNORMAL LOW (ref 8.9–10.3)
Chloride: 103 mmol/L (ref 98–111)
Creatinine, Ser: 0.34 mg/dL — ABNORMAL LOW (ref 0.44–1.00)
GFR, Estimated: >60 mL/min (ref >60)
Glucose, Bld: 157 mg/dL — ABNORMAL HIGH (ref 70–99)
Potassium: 3.4 mmol/L — ABNORMAL LOW (ref 3.5–5.1)
Sodium: 140 mmol/L (ref 135–145)
Total Bilirubin: 1.2 mg/dL (ref 0.3–1.2)
Total Protein: 6.8 g/dL (ref 6.5–8.1)

## 2020-09-20 MED ORDER — SODIUM CHLORIDE 0.9 % IV SOLN: INTRAVENOUS | Status: DC

## 2020-09-20 MED ORDER — LIDOCAINE HCL 1 % IJ SOLN
INTRAMUSCULAR | Status: AC | PRN
  Administered 2020-09-20: 10 mL via INTRADERMAL

## 2020-09-20 MED ORDER — MIDAZOLAM HCL 2 MG/2ML IJ SOLN
INTRAMUSCULAR | Status: AC
  Filled 2020-09-20: qty 4

## 2020-09-20 MED ORDER — LIDOCAINE HCL 1 % IJ SOLN
INTRAMUSCULAR | Status: AC
  Filled 2020-09-20: qty 20

## 2020-09-20 MED ORDER — IOHEXOL 300 MG/ML  SOLN
25.0000 mL | Freq: Once | INTRAMUSCULAR | Status: AC | PRN
  Administered 2020-09-20: 25 mL

## 2020-09-20 MED ORDER — FENTANYL CITRATE (PF) 100 MCG/2ML IJ SOLN
INTRAMUSCULAR | Status: AC | PRN
  Administered 2020-09-20 (×4): 50 ug via INTRAVENOUS

## 2020-09-20 MED ORDER — LEVOFLOXACIN IN D5W 500 MG/100ML IV SOLN
500.0000 mg | Freq: Once | INTRAVENOUS | Status: AC
  Administered 2020-09-20: 500 mg via INTRAVENOUS
  Filled 2020-09-20: qty 100

## 2020-09-20 MED ORDER — POTASSIUM CHLORIDE CRYS ER 20 MEQ PO TBCR: 20.0000 meq | EXTENDED_RELEASE_TABLET | Freq: Every day | ORAL | 0 refills | Status: DC

## 2020-09-20 MED ORDER — MIDAZOLAM HCL 2 MG/2ML IJ SOLN
INTRAMUSCULAR | Status: AC | PRN
  Administered 2020-09-20 (×2): 2 mg via INTRAVENOUS

## 2020-09-20 MED ORDER — SODIUM CHLORIDE 0.9% FLUSH: 5.0000 mL | Freq: Three times a day (TID) | INTRAVENOUS | Status: DC

## 2020-09-20 MED ORDER — FENTANYL CITRATE (PF) 100 MCG/2ML IJ SOLN
INTRAMUSCULAR | Status: AC
  Filled 2020-09-20: qty 4

## 2020-09-20 NOTE — Telephone Encounter (Signed)
Spoke with the patient. She thought she was supposed to be returning a call to Dr Eugenia Pancoast nurse. She also wanted to make a follow up appointment as per her hospital d/c papers.  Discussed with Koren Shiver, RN. She did not have any message for the patient. Patient thinks she may have listened to an old message. Scheduled her a follow up appointment for 10/24/20.

## 2020-09-20 NOTE — Discharge Instructions (Addendum)
Please call Interventional Radiology clinic [phone] with any questions or concerns.   Per Dr. Pascal Lux, continue to care for biliary drain as previously directed.  Flush drain with 47mls saline 2 times a day.  If you develop any leakage reconnect drain to drainage bag.  Moderate Conscious Sedation, Adult, Care After This sheet gives you information about how to care for yourself after your procedure. Your health care provider may also give you more specific instructions. If you have problems or questions, contact your health care provider. What can I expect after the procedure? After the procedure, it is common to have: Sleepiness for several hours. Impaired judgment for several hours. Difficulty with balance. Vomiting if you eat too soon. Follow these instructions at home: For the time period you were told by your health care provider: Rest. Do not participate in activities where you could fall or become injured. Do not drive or use machinery. Do not drink alcohol. Do not take sleeping pills or medicines that cause drowsiness. Do not make important decisions or sign legal documents. Do not take care of children on your own.      Eating and drinking Follow the diet recommended by your health care provider. Drink enough fluid to keep your urine pale yellow. If you vomit: Drink water, juice, or soup when you can drink without vomiting. Make sure you have little or no nausea before eating solid foods.   General instructions Take over-the-counter and prescription medicines only as told by your health care provider. Have a responsible adult stay with you for the time you are told. It is important to have someone help care for you until you are awake and alert. Do not smoke. Keep all follow-up visits as told by your health care provider. This is important. Contact a health care provider if: You are still sleepy or having trouble with balance after 24 hours. You feel  light-headed. You keep feeling nauseous or you keep vomiting. You develop a rash. You have a fever. You have redness or swelling around the IV site. Get help right away if: You have trouble breathing. You have new-onset confusion at home. Summary After the procedure, it is common to feel sleepy, have impaired judgment, or feel nauseous if you eat too soon. Rest after you get home. Know the things you should not do after the procedure. Follow the diet recommended by your health care provider and drink enough fluid to keep your urine pale yellow. Get help right away if you have trouble breathing or new-onset confusion at home. This information is not intended to replace advice given to you by your health care provider. Make sure you discuss any questions you have with your health care provider. Document Revised: 07/29/2019 Document Reviewed: 02/24/2019 Elsevier Patient Education  2021 [employer].

## 2020-09-20 NOTE — H&P (Signed)
Referring Physician(s): Rande Brunt  Supervising Physician: Sandi Mariscal  Patient Status:  [facility]  Chief Complaint: Abdominal pain, biliary obstruction  Subjective: Patient familiar to IR service from internal/external biliary drain placement on 09/08/2020.  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 She underwent EUS on 09/14/20 but gastric bypass anatomy precluded visualization of bile duct and pancreatic head.  She presents today in follow-up for cholangiogram with possible bile duct brush biopsy.  She currently denies fever, headache, chest pain, dyspnea, cough, back pain, nausea, vomiting or bleeding.  Her biliary drain is functioning appropriately. She is flushing the drain once daily.  Past Medical History:  Diagnosis Date   Allergy    Angio-edema    Anxiety    Arthritis    hips, knees and hands    Blood transfusion without reported diagnosis    Depression    GERD (gastroesophageal reflux disease)    hx of    H/O hiatal hernia    History of chicken pox    Hypercholesterolemia    Hypertension    Migraine    Migraines    Morbid obesity ([place])    Positive TB test    PTSD (post-traumatic stress disorder)    Sleep apnea    no CPAP machine    Ulcer, stomach peptic    Urticaria    Past Surgical History:  Procedure Laterality Date   ABDOMINAL HYSTERECTOMY  1997 & 2006   BREATH TEK H PYLORI  05/03/2012   Procedure: BREATH TEK H PYLORI;  Surgeon: [name], MD;  Location: [name];  Service: General;  Laterality: N/A;   COLONOSCOPY     hx of benign polyps    ESOPHAGOGASTRODUODENOSCOPY N/A 09/29/2017   Procedure: ESOPHAGOGASTRODUODENOSCOPY (EGD);  Surgeon: Ladene Artist, MD;  Location: [name];  Service: Endoscopy;  Laterality: N/A;   ESOPHAGOGASTRODUODENOSCOPY N/A 09/14/2020   Procedure: ESOPHAGOGASTRODUODENOSCOPY (EGD);  Surgeon: [name], MD;  Location: [name];   Service: Endoscopy;  Laterality: N/A;   EUS N/A 09/14/2020   Procedure: UPPER ENDOSCOPIC ULTRASOUND (EUS) RADIAL;  Surgeon: [name], MD;  Location: [facility];  Service: Endoscopy;  Laterality: N/A;   FOOT SURGERY     GASTRIC BYPASS     GASTRIC ROUX-EN-Y N/A 07/05/2012   Procedure: LAPAROSCOPIC ROUX-EN-Y GASTRIC;  Surgeon: [name], MD;  Location: [facility];  Service: General;  Laterality: N/A;   IR BILIARY DRAIN PLACEMENT WITH CHOLANGIOGRAM  09/08/2020   IRRIGATION AND DEBRIDEMENT ABSCESS N/A 01/01/2014   Procedure: IRRIGATION AND DEBRIDEMENT ABSCESS;  Surgeon: [name], MD;  Location: [facility];  Service: General;  Laterality: N/A;   LAPAROSCOPIC LYSIS OF ADHESIONS N/A 07/05/2012   Procedure: LAPAROSCOPIC LYSIS OF ADHESIONS;  Surgeon: [name], MD;  Location: [facility];  Service: General;  Laterality: N/A;  repair of abdominal wall hernia   right tube and ovary removed      UPPER GI ENDOSCOPY N/A 07/05/2012   Procedure: UPPER GI ENDOSCOPY;  Surgeon: [name], MD;  [facility]: [facility];  Service: General;  Laterality: N/A;      Allergies: Contrast media [iodinated diagnostic agents], Amoxicillin, Bactrim [sulfamethoxazole-trimethoprim], and Lamictal [lamotrigine]  Medications: Prior to Admission medications   Medication Sig Start Date End Date Taking? Authorizing Provider  calcium-vitamin D (OSCAL WITH D) 500-200 MG-UNIT tablet Take 1 tablet by mouth every evening. For bone health 09/10/20  Yes Samuella Cota, MD  cetirizine (ZYRTEC) 10 MG tablet Take 10 mg by mouth 2 (two) times daily.   Yes [provider]  cholecalciferol (VITAMIN D) 1000 units tablet Take 1 tablet (1,000 Units total) by mouth daily. For bone health 09/17/15  Yes Lindell Spar I, NP  DULoxetine (CYMBALTA) 60 MG capsule Take 1 capsule (60 mg total) by mouth 2 (two) times daily. 08/29/20  Yes Arfeen, Arlyce Harman, MD  famotidine (PEPCID) 20 MG tablet Take 1 tablet (20 mg total) by mouth daily.  09/10/20  Yes Samuella Cota, MD  metoprolol succinate (TOPROL XL) 25 MG 24 hr tablet Take 1 tablet (25 mg total) by mouth daily. 08/31/20  Yes Tobb, Kardie, DO  montelukast (SINGULAIR) 10 MG tablet TAKE 1 TABLET(10 MG) BY MOUTH AT BEDTIME 09/18/20  Yes Garnet Sierras, DO  Multiple Vitamin (MULTI-VITAMIN) tablet Take 1 tablet by mouth daily.   Yes [provider]  QUEtiapine (SEROQUEL) 50 MG tablet TAKE 3 TABLETS(150 MG) BY MOUTH AT BEDTIME Patient taking differently: Take 150 mg by mouth at bedtime. 08/29/20  Yes Arfeen, Arlyce Harman, MD  vitamin B-12 (CYANOCOBALAMIN) 500 MCG tablet Take 1 tablet (500 mcg total) by mouth daily. For Vitamin B-12 replacement Patient taking differently: Take 500 mcg by mouth daily. 09/17/15  Yes Lindell Spar I, NP  vitamin E 1000 UNIT capsule Take 1,000 Units by mouth daily.   Yes [provider]  XOLAIR 150 MG injection INJECT 300 MG UNDER THE SKIN EVERY 14 DAYS Patient taking differently: Inject 150 mg into the skin every 28 (twenty-eight) days. 06/21/20  Yes Garnet Sierras, DO  EPINEPHrine (EPIPEN 2-PAK) 0.3 mg/0.3 mL IJ SOAJ injection Inject 0.3 mg into the muscle as needed for anaphylaxis. 07/09/20   Garnet Sierras, DO     Vital Signs: BP 139/79   Pulse 77   Temp 98.4 F (36.9 C) (Oral)   Resp 18   SpO2 95%   Physical Exam awake, alert.  Chest clear to auscultation bilaterally.  Heart with regular rate and rhythm.  Abdomen soft, biliary drain intact, insertion site mildly tender to palpation, approximately 75 cc of dark green bile in bag.  No lower extremity edema.  Imaging: No results found.  Labs:  CBC: Recent Labs    09/14/20 0356 09/15/20 0437 09/18/20 1416 09/20/20 0908  WBC 9.4 10.5 11.2* 8.3  HGB 11.7* 11.5* 12.4 12.0  HCT 34.0* 34.6* 36.5 35.0*  PLT 332 308 494.0* 472*    COAGS: Recent Labs    09/07/20 1845  INR 0.9    BMP: Recent Labs    09/13/20 0442 09/14/20 0356 09/15/20 0437 09/18/20 1416 09/20/20 0908  NA 138  137 140 141 140  K 4.4 4.3 3.2* 3.2* 3.4*  CL 107 106 106 103 103  CO2 20* '24 25 25 26  ' GLUCOSE 124* 120* 106* 106* 157*  BUN '12 8 12 6 6  ' CALCIUM 9.2 8.3* 8.1* 8.5 8.8*  CREATININE 0.62 0.60 0.53 0.47 0.34*  GFRNONAA >60 >60 >60  --  >60    LIVER FUNCTION TESTS: Recent Labs    09/14/20 0356 09/15/20 0437 09/18/20 1416 09/20/20 0908  BILITOT 2.5* 1.5* 1.2 1.2  AST 28 10* 9 14*  ALT 77* 53* 30 27  ALKPHOS 321* 262* 299* 250*  PROT 5.5* 5.3* 6.2 6.8  ALBUMIN 2.4* 2.2* 3.0* 2.6*    Assessment and Plan: Patient familiar to IR service from internal/external biliary drain placement on 09/08/2020.  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 She underwent EUS on 09/14/20 but gastric bypass anatomy precluded visualization of bile duct and pancreatic head.  She presents today in follow-up for cholangiogram with possible bile duct brush biopsy.  Details/risks of procedure, including but not limited to, internal bleeding, infection, injury to adjacent structures, need for prolonged drainage discussed with patient with her understanding and consent.  Latest labs include normal WBC, hemoglobin 12, platelets 472k, potassium 3.4, creatinine 0.34, total bilirubin 1.2, alk phos 250; patient has also been premedicated secondary to contrast allergy.   Electronically Signed: D. Rowe Robert, PA-C 09/20/2020, 10:08 AM   I spent a total of 20 minutes at the the patient's bedside AND on the patient's hospital floor or unit, greater than 50% of which was counseling/coordinating care for cholangiogram with possible bile duct brush biopsy

## 2020-09-20 NOTE — Procedures (Signed)
Pre procedural Diagnosis: Biliary Obstruction Post procedural Diagnosis: Same  Technically successful fluoroscopic guided brush biopsy of narrowing of the distal aspect of the CBD.  Successful fluroscopic exchange and up-sizing of right sided approach transhepatic now 12 Fr biliary drainage catheter with end coiled and locked within the duodenum. Biliary drain capped for trial of internalization.  EBL: None No immediate post procedural complications.  Ronny Bacon, MD Pager #: [phone]

## 2020-09-21 ENCOUNTER — Telehealth: Payer: Self-pay | Admitting: *Deleted

## 2020-09-21 LAB — CANCER ANTIGEN 19-9: CA 19-9: 61 U/mL — ABNORMAL HIGH (ref 0–35)

## 2020-09-21 LAB — CYTOLOGY - NON PAP

## 2020-09-21 NOTE — Progress Notes (Signed)
Spoke with patient she stated that IR went good. They replaced the line with a bigger line.  No problem with leaking last nigh.  They got the bilary line in and capped off and was able to biopsy.  She hope to get results back next week.  They cancelled the consultation on the 17th and not sure when they are going to reschedule.  She will try and find out today.  I advised her to call and let us know when they get her scheduled.  If not to call us back to schedule that one week lab follow up.  She is still working on the FMLA/disability forms and hopefully they will get sent today.--Phone message sent to PCP

## 2020-09-21 NOTE — Telephone Encounter (Signed)
Patient called back she states she received blood results. She was concerned about Cancer Antigen 19-9 result being abnormal.   I did advise her sometimes results are released to the patient before the provider so Mickel Baas likely had not been able to view them yet. It looks like the labs were ordered while patient was in hospital.   I advised her I would send PCP an urgent message to view and comment. She has been advised we would call her with further advice asap. She was very Patent attorney.

## 2020-09-21 NOTE — Telephone Encounter (Signed)
Sent patient mychart message with further advice.

## 2020-09-21 NOTE — Telephone Encounter (Signed)
-----   Message from [name], [place] sent at 09/20/2020  4:40 PM EDT ----- Please let her know that overall her labs are looking better. There are a few things: 1) Majority of her liver functions have actually normalized. How did the procedure go today with IR? When is she scheduled to see surgery?   2) Her WBC is continuing to be slightly elevated which is not unexpected. However, if she is not seeing anyone in follow up soon, I want to make sure we are keeping a watch on things for her. If she is not seeing surgery within the week, let's re-check labs in 1 week.   3) Let me know what she needs filled out as far as FMLA/ disability from her job.  4) Her potassium was down slightly- not overly concerning but I am going to put on 3 days of potassium to limit any further complications.

## 2020-09-21 NOTE — Telephone Encounter (Signed)
Spoke with patient she stated that IR went good. They replaced the line with a bigger line.  No problem with leaking last nigh.  They got the bilary line in and capped off and was able to biopsy.  She hope to get results back next week.  They cancelled the consultation on the 17th and not sure when they are going to reschedule.  She will try and find out today.  I advised her to call and let us know when they get her scheduled.  If not to call us back to schedule that one week lab follow up.  She is still working on the FMLA/disability forms and hopefully they will get sent today.

## 2020-09-24 ENCOUNTER — Telehealth: Payer: Self-pay | Admitting: Family

## 2020-09-24 ENCOUNTER — Telehealth: Payer: Self-pay

## 2020-09-24 NOTE — Telephone Encounter (Signed)
I have received the paperwork and informed pt of this as well. We will work on it.

## 2020-09-24 NOTE — Telephone Encounter (Signed)
I have called the pt and relayed the message from the provider and she stated understanding. She said that it helped her clarify who she should follow up with.

## 2020-09-24 NOTE — Telephone Encounter (Signed)
FMLA paperwork faxed into front office  Placed into [place] bin up front

## 2020-09-24 NOTE — Telephone Encounter (Signed)
Pt has called the office to inform us that she is anxious about the biopsy results. I have informed her that it has not been reviewed yet and once it has we will send her a message or give her a call and she stated understanding.

## 2020-09-25 ENCOUNTER — Ambulatory Visit (INDEPENDENT_AMBULATORY_CARE_PROVIDER_SITE_OTHER): Payer: 59 | Admitting: *Deleted

## 2020-09-25 ENCOUNTER — Encounter: Payer: Self-pay | Admitting: Family

## 2020-09-25 ENCOUNTER — Other Ambulatory Visit: Payer: Self-pay | Admitting: Family

## 2020-09-25 ENCOUNTER — Other Ambulatory Visit: Payer: Self-pay

## 2020-09-25 DIAGNOSIS — L501 Idiopathic urticaria: Secondary | ICD-10-CM

## 2020-09-25 MED ORDER — OMALIZUMAB 150 MG ~~LOC~~ SOLR
300.0000 mg | SUBCUTANEOUS | Status: AC
  Administered 2020-09-25 – 2020-12-25 (×4): 300 mg via SUBCUTANEOUS

## 2020-09-25 NOTE — Telephone Encounter (Signed)
I have called and spoken to the pt about her FMLA forms. She has been out of work since 09/07/20 and agreed that she should stay on continuous FMLA until she gets an update on what the next steps are for her care.

## 2020-09-25 NOTE — Telephone Encounter (Signed)
I have called pt back and informed her that she does have a general surgery appointment coming up on 07/29/20 @ 4:10pm. She stated understanding and will call them at [phone] to confirm the appointment. She will give [place] an update via my-chart.   We are working on the [employer] paperwork.

## 2020-09-26 NOTE — Telephone Encounter (Signed)
FMLA forms have been completed and faxed to [employer] at [phone]. A copy has been sent to scan and the original is in my completed FMLA folder.

## 2020-09-26 NOTE — Telephone Encounter (Signed)
I have contacted the pt to let her know the FMLA forms have been completed and she has informed me that her surgery consult has been moved up to tomorrow at 2pm with a Dr. Zenia Resides. Pt reports that she will give [place] update and she is glad that things are moving along.

## 2020-09-26 NOTE — Telephone Encounter (Signed)
I have called pt back and informed her that the FMLA forms have been completed and faxed to [employer]. She stated understanding and give [place] any update if needed.

## 2020-09-28 ENCOUNTER — Encounter: Payer: Self-pay | Admitting: Family

## 2020-09-29 ENCOUNTER — Encounter: Payer: Self-pay | Admitting: Allergy

## 2020-10-01 ENCOUNTER — Other Ambulatory Visit (HOSPITAL_BASED_OUTPATIENT_CLINIC_OR_DEPARTMENT_OTHER): Payer: Self-pay | Admitting: Surgery

## 2020-10-01 ENCOUNTER — Other Ambulatory Visit: Payer: Self-pay

## 2020-10-01 ENCOUNTER — Telehealth: Payer: Self-pay

## 2020-10-01 ENCOUNTER — Other Ambulatory Visit (HOSPITAL_COMMUNITY): Payer: Self-pay | Admitting: Surgery

## 2020-10-01 ENCOUNTER — Ambulatory Visit (HOSPITAL_COMMUNITY): Payer: 59 | Attending: Cardiology

## 2020-10-01 ENCOUNTER — Other Ambulatory Visit: Payer: Self-pay | Admitting: Surgery

## 2020-10-01 DIAGNOSIS — R5383 Other fatigue: Secondary | ICD-10-CM

## 2020-10-01 DIAGNOSIS — K831 Obstruction of bile duct: Secondary | ICD-10-CM

## 2020-10-01 LAB — ECHOCARDIOGRAM COMPLETE
Area-P 1/2: 3.53 cm2
S' Lateral: 2.8 cm

## 2020-10-01 NOTE — Telephone Encounter (Signed)
-----   Message from Berniece Salines, DO sent at 10/01/2020 10:54 AM EDT ----- Echo normal

## 2020-10-01 NOTE — Telephone Encounter (Signed)
Spoke with patient regarding results and recommendation.  Patient verbalizes understanding and is agreeable to plan of care. Advised patient to call back with any issues or concerns.  

## 2020-10-02 ENCOUNTER — Telehealth (INDEPENDENT_AMBULATORY_CARE_PROVIDER_SITE_OTHER): Payer: 59 | Admitting: Family

## 2020-10-02 ENCOUNTER — Other Ambulatory Visit (HOSPITAL_BASED_OUTPATIENT_CLINIC_OR_DEPARTMENT_OTHER): Payer: Self-pay | Admitting: Surgery

## 2020-10-02 ENCOUNTER — Other Ambulatory Visit (HOSPITAL_COMMUNITY): Payer: Self-pay | Admitting: Surgery

## 2020-10-02 ENCOUNTER — Encounter: Payer: Self-pay | Admitting: Family

## 2020-10-02 ENCOUNTER — Telehealth (HOSPITAL_COMMUNITY): Payer: Self-pay | Admitting: *Deleted

## 2020-10-02 VITALS — Ht 69.0 in | Wt 248.0 lb

## 2020-10-02 DIAGNOSIS — F410 Panic disorder [episodic paroxysmal anxiety] without agoraphobia: Secondary | ICD-10-CM

## 2020-10-02 DIAGNOSIS — F419 Anxiety disorder, unspecified: Secondary | ICD-10-CM | POA: Diagnosis not present

## 2020-10-02 DIAGNOSIS — K831 Obstruction of bile duct: Secondary | ICD-10-CM

## 2020-10-02 MED ORDER — LORAZEPAM 0.5 MG PO TABS: 0.5000 mg | ORAL_TABLET | Freq: Every day | ORAL | 0 refills | Status: DC | PRN

## 2020-10-02 NOTE — Telephone Encounter (Signed)
Pt called requesting a call back from you. Pt is scheduled for a Whipple on 11/05/20 and would like to discuss concerns. Pt next appointment not until 11/29/20. Would you like me to schedule for earlier appointment? Please advise.

## 2020-10-02 NOTE — Progress Notes (Signed)
Brittany [name] is a 55 y.o. female with the following history as recorded in EpicCare:  Patient Active Problem List   Diagnosis Date Noted   Abdominal pain 09/13/2020   Biliary obstruction 09/12/2020   Obstructive jaundice 09/08/2020   Elevated LFTs 09/08/2020   Choledocholithiasis 09/07/2020   Urticaria    Ulcer, stomach peptic    Sleep apnea    Positive TB test    Migraines    Hypercholesterolemia    History of chicken pox    H/O hiatal hernia    Blood transfusion without reported diagnosis    Arthritis    Anxiety    Angio-edema    Allergy    Allergic contact dermatitis 07/09/2020   Chronic idiopathic urticaria 01/17/2019   Seasonal and perennial allergic rhinoconjunctivitis 11/08/2018   Allergic reaction 11/08/2018   Diarrhea 11/08/2018   Right thigh pain 05/19/2018   Melena    Acute gastrojejunal anastomotic ulcer    Macrocytic anemia    Acute GI bleeding 09/28/2017   Epigastric abdominal pain 11/19/2016   Incisional hernia with obstruction but no gangrene 07/21/2016   Menopause 04/22/2016   Routine general medical examination at a health care facility 03/18/2016   Obesity 03/18/2016   Severe episode of recurrent major depressive disorder, without psychotic features ([place])    Depression, major, severe recurrence ([place]) 09/11/2015   Major depressive disorder, recurrent, severe without psychotic features ([place])    MDD (major depressive disorder) 10/30/2014   MDD (major depressive disorder), recurrent severe, without psychosis ([place])    Suicidal ideation 10/05/2014   Depression 06/12/2014   Rash 01/04/2014   Normocytic anemia 01/04/2014   DJD (degenerative joint disease) 01/04/2014   Hypertension 01/04/2014   Dyslipidemia 01/04/2014   GERD (gastroesophageal reflux disease) 01/04/2014   Obstructive sleep apnea 01/04/2014   Status post small bowel resection 01/04/2014   Abdominal abscess 01/01/2014   Wound infection after surgery 01/01/2014   PTSD (post-traumatic  stress disorder) 11/23/2013   Severe recurrent major depression without psychotic features ([place]) 11/22/2013   History of Roux-en-Y gastric bypass, 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 For bone health     cetirizine (ZYRTEC) 10 MG tablet Take 10 mg by mouth 2 (two) times daily.     cholecalciferol (VITAMIN D) 1000 units tablet Take 1 tablet (1,000 Units total) by mouth daily. For bone health     DULoxetine (CYMBALTA) 60 MG capsule Take 1 capsule (60 mg total) by mouth 2 (two) times daily. 180 capsule 0   EPINEPHrine (EPIPEN 2-PAK) 0.3 mg/0.3 mL IJ SOAJ injection Inject 0.3 mg into the muscle as needed for anaphylaxis. 0.3 mL 1   famotidine (PEPCID) 20 MG tablet Take 1 tablet (20 mg total) by mouth daily.     metoprolol succinate (TOPROL XL) 25 MG 24 hr tablet Take 1 tablet (25 mg total) by mouth daily. 90 tablet 3   montelukast (SINGULAIR) 10 MG tablet TAKE 1 TABLET(10 MG) BY MOUTH AT BEDTIME 30 tablet 1   Multiple Vitamin (MULTI-VITAMIN) tablet Take 1 tablet by mouth daily.     QUEtiapine (SEROQUEL) 50 MG tablet TAKE 3 TABLETS(150 MG) BY MOUTH AT BEDTIME (Patient taking differently: Take 150 mg by mouth at bedtime.) 90 tablet 2   vitamin B-12 (CYANOCOBALAMIN) 500 MCG tablet Take 1 tablet (500 mcg  total) by mouth daily. For Vitamin B-12 replacement (Patient taking differently: Take 500 mcg by mouth daily.)     vitamin E 1000 UNIT capsule Take 1,000 Units by mouth daily.     XOLAIR 150 MG injection INJECT 300 MG UNDER THE SKIN EVERY 14 DAYS (Patient taking differently: Inject 150 mg into the skin every 28 (twenty-eight) days.) 4 each 11   Current Facility-Administered Medications  Medication Dose Route Frequency Provider Last Rate Last Admin   omalizumab Arvid Right) injection 300 mg  300 mg  Subcutaneous Q28 days Garnet Sierras, DO   300 mg at 09/25/20 1018    Allergies: Contrast media [iodinated diagnostic agents], Amoxicillin, Bactrim [sulfamethoxazole-trimethoprim], and Lamictal [lamotrigine]  Past Medical History:  Diagnosis Date   Allergy    Angio-edema    Anxiety    Arthritis    hips, knees and hands    Blood transfusion without reported diagnosis    Depression    GERD (gastroesophageal reflux disease)    hx of    H/O hiatal hernia    History of chicken pox    Hypercholesterolemia    Hypertension    Migraine    Migraines    Morbid obesity ([place])    Positive TB test    PTSD (post-traumatic stress disorder)    Sleep apnea    no CPAP machine    Ulcer, stomach peptic    Urticaria     Past Surgical History:  Procedure Laterality Date   ABDOMINAL HYSTERECTOMY  1997 & 2006   BREATH TEK H PYLORI  05/03/2012   Procedure: BREATH TEK Kandis Ban;  Surgeon: [name], MD;  Location: [name];  Service: General;  Laterality: N/A;   COLONOSCOPY     hx of benign polyps    ESOPHAGOGASTRODUODENOSCOPY N/A 09/29/2017   Procedure: ESOPHAGOGASTRODUODENOSCOPY (EGD);  Surgeon: Ladene Artist, MD;  Location: [name];  Service: Endoscopy;  Laterality: N/A;   ESOPHAGOGASTRODUODENOSCOPY N/A 09/14/2020   Procedure: ESOPHAGOGASTRODUODENOSCOPY (EGD);  Surgeon: [name], MD;  Location: [name];  Service: Endoscopy;  Laterality: N/A;   EUS N/A 09/14/2020   Procedure: UPPER ENDOSCOPIC ULTRASOUND (EUS) RADIAL;  Surgeon: [name], MD;  Location: [facility];  Service: Endoscopy;  Laterality: N/A;   FOOT SURGERY     GASTRIC BYPASS     GASTRIC ROUX-EN-Y N/A 07/05/2012   Procedure: LAPAROSCOPIC ROUX-EN-Y GASTRIC;  Surgeon: [name], MD;  Location: [facility];  Service: General;  Laterality: N/A;   IR BILIARY DRAIN PLACEMENT WITH CHOLANGIOGRAM  09/08/2020   IR ENDOLUMINAL BX OF BILIARY TREE  09/20/2020   IR EXCHANGE BILIARY DRAIN  09/20/2020   IRRIGATION AND  DEBRIDEMENT ABSCESS N/A 01/01/2014   Procedure: IRRIGATION AND DEBRIDEMENT ABSCESS;  Surgeon: [name], MD;  Location: [facility];  Service: General;  Laterality: N/A;   LAPAROSCOPIC LYSIS OF ADHESIONS N/A 07/05/2012   Procedure: LAPAROSCOPIC LYSIS OF ADHESIONS;  Surgeon: [name], MD;  Location: [facility];  Service: General;  Laterality: N/A;  repair of abdominal wall hernia   right tube and ovary removed      UPPER GI ENDOSCOPY N/A 07/05/2012   Procedure: UPPER GI ENDOSCOPY;  Surgeon: [name], MD;  Location: [facility];  Service: General;  Laterality: N/A;    Family History  Problem Relation Age of Onset   Colon cancer Mother 3   Depression Father    Diabetes Father    Heart disease Father    Bladder Cancer Father  Depression Sister    Post-traumatic stress disorder Sister    Alcohol abuse Paternal Uncle    Bipolar disorder Cousin    Anxiety disorder Sister    Eczema Sister    Cancer Maternal Aunt        breast   Breast cancer Maternal Aunt    Melanoma Maternal Grandmother    Colon cancer Maternal Grandmother        does not know age of onset   Healthy Maternal Grandfather    Healthy Paternal Grandmother    Heart disease Paternal Grandfather    Stroke Paternal Grandfather    Diabetes Paternal Grandfather     Social History   Tobacco Use   Smoking status: Former    Packs/day: 0.10    Years: 1.00    Pack years: 0.10    Types: Cigarettes    Quit date: 04/27/2004    Years since quitting: 16.4   Smokeless tobacco: Never  Substance Use Topics   Alcohol use: Yes    Alcohol/week: 0.0 standard drinks    Comment: Occasional use/ 2 drinks a month    Subjective:   I connected with [name] on 10/02/20 at  8:40 AM EDT by a video enabled telemedicine application and verified that I am speaking with the correct person using two identifiers.   I discussed the limitations of evaluation and management by telemedicine and the availability of in person appointments.  The patient expressed understanding and agreed to proceed. Provider in office/ patient is at home; provider and patient are only 2 people on video call.   Patient has met with surgeon since I last spoke with her and notes that Whipple procedure is being recommended. Patient is understandably anxious and upset about potential diagnosis of cancer. She is feeling overwhelmed with the prospect of surgery and the treatment options being discussed.  She is wondering about her short term disability options as well. FMLA was put in place last week for her. She did speak to her GI yesterday and notes he offered her the name of surgeon at [facility] who could be a 2nd opinion; not able to see this person until July 12; Is scheduled for chest CT tomorrow;    Objective:  Vitals:   10/02/20 0824  Weight: 248 lb (112.5 kg)  Height: '5\' 9"'  (1.753 m)    General: Well developed, well nourished, in no acute distress  Skin : Warm and dry.  Head: Normocephalic and atraumatic  Lungs: Respirations unlabored;  Neurologic: Alert and oriented; speech intact; face symmetrical; moves all extremities well; CNII-XII intact without focal deficit   Assessment:  1. Biliary obstruction   2. Anxiety     Plan:   Time spent discussing patient concerns and trying to help offer clarification for her treatment plan; she will see if she can get on cancellation list with 2nd surgeon to hopefully be seen sooner; she will keep scheduled appointment for chest CT; she will let us know what short term disability forms need to be completed; Patient understands she should feel free to reach out at any time with any question and we will do what we can to help support her/ get the answers she needs.  Time spent 30 minutes reviewing and discussing care/ answering questions  No follow-ups on file.  No orders of the defined types were placed in this encounter.   Requested Prescriptions    No prescriptions requested or  ordered in this encounter

## 2020-10-02 NOTE — Telephone Encounter (Signed)
I returned patient's phone call.  She is under a lot of stress as recently suspicion of cancer and going for a Whipple procedure.  She admitted increased anxiety and nervousness.  I recommend that she can take lorazepam 0.5 mg to help these anxiety and panic attacks.  She used to take it but has not taken more than a year as she does not need it.  I also encourage to see Secundino Ginger more frequently to have a better coping skills.  Reassurance given.  Patient is on earlier waiting list with [facility].  I will call lorazepam 0.5 mg #15 to take as needed for severe anxiety.  Please schedule appointment earlier than August 18.

## 2020-10-02 NOTE — Telephone Encounter (Signed)
Will do!

## 2020-10-02 NOTE — Telephone Encounter (Signed)
Another part of FMLA sent in? Not sure if they are needing extra notes or if it is a dupe  I did not put a charge sheet on top....  Placed into [place] bin

## 2020-10-03 ENCOUNTER — Other Ambulatory Visit: Payer: Self-pay

## 2020-10-03 ENCOUNTER — Ambulatory Visit (HOSPITAL_COMMUNITY)
Admission: RE | Admit: 2020-10-03 | Discharge: 2020-10-03 | Disposition: A | Discharge status: Home / Self Care | Payer: 59 | Source: Ambulatory Visit | Attending: Surgery | Admitting: Surgery

## 2020-10-03 DIAGNOSIS — K831 Obstruction of bile duct: Secondary | ICD-10-CM

## 2020-10-03 NOTE — Progress Notes (Signed)
The proposed treatment discussed in conference is for discussion purpose only and is not a binding recommendation.  The patients have not been physically examined, or presented with their treatment options.  Therefore, final treatment plans cannot be decided.  

## 2020-10-04 ENCOUNTER — Ambulatory Visit (INDEPENDENT_AMBULATORY_CARE_PROVIDER_SITE_OTHER): Payer: 59 | Admitting: Psychology

## 2020-10-04 DIAGNOSIS — F331 Major depressive disorder, recurrent, moderate: Secondary | ICD-10-CM | POA: Diagnosis not present

## 2020-10-04 DIAGNOSIS — F411 Generalized anxiety disorder: Secondary | ICD-10-CM

## 2020-10-08 ENCOUNTER — Other Ambulatory Visit: Payer: Self-pay

## 2020-10-08 ENCOUNTER — Encounter (HOSPITAL_COMMUNITY): Payer: Self-pay | Admitting: Psychiatry

## 2020-10-08 ENCOUNTER — Telehealth (INDEPENDENT_AMBULATORY_CARE_PROVIDER_SITE_OTHER): Payer: 59 | Admitting: Psychiatry

## 2020-10-08 DIAGNOSIS — F4310 Post-traumatic stress disorder, unspecified: Secondary | ICD-10-CM

## 2020-10-08 DIAGNOSIS — F3341 Major depressive disorder, recurrent, in partial remission: Secondary | ICD-10-CM | POA: Diagnosis not present

## 2020-10-08 DIAGNOSIS — F410 Panic disorder [episodic paroxysmal anxiety] without agoraphobia: Secondary | ICD-10-CM
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
## 2020-10-09 ENCOUNTER — Ambulatory Visit (INDEPENDENT_AMBULATORY_CARE_PROVIDER_SITE_OTHER): Payer: 59 | Admitting: Psychology

## 2020-10-09 DIAGNOSIS — F331 Major depressive disorder, recurrent, moderate: Secondary | ICD-10-CM | POA: Diagnosis not present

## 2020-10-09 DIAGNOSIS — F411 Generalized anxiety disorder: Secondary | ICD-10-CM

## 2020-10-13 ENCOUNTER — Other Ambulatory Visit: Payer: Self-pay | Admitting: Allergy

## 2020-10-17 ENCOUNTER — Ambulatory Visit (INDEPENDENT_AMBULATORY_CARE_PROVIDER_SITE_OTHER): Payer: 59 | Admitting: Psychology

## 2020-10-17 ENCOUNTER — Institutional Professional Consult (permissible substitution): Payer: 59 | Admitting: Neurology

## 2020-10-17 DIAGNOSIS — F411 Generalized anxiety disorder: Secondary | ICD-10-CM | POA: Diagnosis not present

## 2020-10-17 DIAGNOSIS — F331 Major depressive disorder, recurrent, moderate: Secondary | ICD-10-CM

## 2020-10-23 ENCOUNTER — Ambulatory Visit (INDEPENDENT_AMBULATORY_CARE_PROVIDER_SITE_OTHER): Payer: 59 | Admitting: *Deleted

## 2020-10-23 ENCOUNTER — Other Ambulatory Visit: Payer: Self-pay

## 2020-10-23 DIAGNOSIS — L501 Idiopathic urticaria: Secondary | ICD-10-CM

## 2020-10-24 ENCOUNTER — Encounter: Payer: Self-pay | Admitting: Family

## 2020-10-24 ENCOUNTER — Encounter: Payer: Self-pay | Admitting: Gastroenterology

## 2020-10-24 ENCOUNTER — Ambulatory Visit (INDEPENDENT_AMBULATORY_CARE_PROVIDER_SITE_OTHER): Payer: 59 | Admitting: Gastroenterology

## 2020-10-24 VITALS — BP 100/70 | HR 76 | Ht 69.0 in | Wt 239.4 lb

## 2020-10-24 DIAGNOSIS — K831 Obstruction of bile duct: Secondary | ICD-10-CM

## 2020-10-24 NOTE — Patient Instructions (Signed)
If you are age 56 or younger, your body mass index should be between 19-25. Your Body mass index is 35.35 kg/m. If this is out of the aformentioned range listed, please consider follow up with your Primary Care Provider.  __________________________________________________________  The [facility] providers would like to encourage you to use [facility] to communicate with providers for non-urgent requests or questions.  Due to long hold times on the telephone, sending your provider a message by [facility] may be a faster and more efficient way to get a response.  Please allow 48 business hours for a response.  Please remember that this is for non-urgent requests.  __________________________________________________________  We will continue with planned Whipple procedure in 2 weeks.  Thank you for entrusting me with your care and choosing [facility].  Dr Ardis Hughs

## 2020-10-24 NOTE — Progress Notes (Signed)
HPI: This is a very pleasant 55 year old woman  She is a patient of Dr. Lenoard Aden.  I have been helping her with a biliary obstructive process after I met her while she was an inpatient about 2 months ago.  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  She currently has interventional radiologically placed biliary drain.  This has helped her obstruction which seems to be distal bile duct versus head of pancreas.  I interrogated her upper GI tract with endoscopic ultrasound however her significantly altered postsurgical anatomy did not allow for really any evaluation of the distal bile duct, head of pancreas.  The stricture has been sampled by interventional radiology and atypical cells were noted on biliary brushing.  She is scheduled for a Whipple procedure with Dr. Michaelle Birks later this month.  She met with Dr. Birdie Sons at [facility] yesterday for second opinion about her situation.  He agreed with plans for Whipple and recommended that she go ahead with it.  She overall feels well.  She is continuing to lose weight has lost a total of 36 pounds in about 6 months.  She has a poor appetite generally.  She has no significant abdominal pains.  Her biliary drain is capped and has been capped for 6 weeks now and is not causing any difficulties for her.  ROS: complete GI ROS as described in HPI, all other review negative.  Constitutional:  No unintentional weight loss   Past Medical History:  Diagnosis Date   Allergy    Angio-edema    Anxiety    Arthritis    hips, knees and hands    Blood transfusion without reported diagnosis    Depression    GERD (gastroesophageal reflux disease)    hx of    H/O hiatal hernia    History of chicken pox    Hypercholesterolemia    Hypertension    Migraine    Migraines    Morbid obesity ([place])    Positive TB test    PTSD (post-traumatic stress  disorder)    Sleep apnea    no CPAP machine    Ulcer, stomach peptic    Urticaria     Past Surgical History:  Procedure Laterality Date   ABDOMINAL HYSTERECTOMY  1997 & 2006   BREATH TEK H PYLORI  05/03/2012   Procedure: BREATH TEK H PYLORI;  Surgeon: [name], MD;  Location: [name];  Service: General;  Laterality: N/A;   COLONOSCOPY     hx of benign polyps    ESOPHAGOGASTRODUODENOSCOPY N/A 09/29/2017   Procedure: ESOPHAGOGASTRODUODENOSCOPY (EGD);  Surgeon: Ladene Artist, MD;  Location: [name];  Service: Endoscopy;  Laterality: N/A;   ESOPHAGOGASTRODUODENOSCOPY N/A 09/14/2020   Procedure: ESOPHAGOGASTRODUODENOSCOPY (EGD);  Surgeon: [name], MD;  Location: [name];  Service: Endoscopy;  Laterality: N/A;   EUS N/A 09/14/2020   Procedure: UPPER ENDOSCOPIC ULTRASOUND (EUS) RADIAL;  Surgeon: [name], MD;  Location: [facility];  Service: Endoscopy;  Laterality: N/A;   FOOT SURGERY Left    GASTRIC BYPASS     GASTRIC ROUX-EN-Y N/A 07/05/2012   Procedure: LAPAROSCOPIC ROUX-EN-Y GASTRIC;  Surgeon: [name], MD;  Location: [facility];  Service: General;  Laterality: N/A;   IR BILIARY DRAIN PLACEMENT WITH CHOLANGIOGRAM  09/08/2020   IR ENDOLUMINAL BX OF BILIARY TREE  09/20/2020   IR EXCHANGE BILIARY DRAIN  09/20/2020   IRRIGATION AND DEBRIDEMENT ABSCESS  N/A 01/01/2014   Procedure: IRRIGATION AND DEBRIDEMENT ABSCESS;  Surgeon: [name], MD;  Location: [facility];  Service: General;  Laterality: N/A;   LAPAROSCOPIC LYSIS OF ADHESIONS N/A 07/05/2012   Procedure: LAPAROSCOPIC LYSIS OF ADHESIONS;  Surgeon: [name], MD;  Location: [facility];  Service: General;  Laterality: N/A;  repair of abdominal wall hernia   right tube and ovary removed      UPPER GI ENDOSCOPY N/A 07/05/2012   Procedure: UPPER GI ENDOSCOPY;  Surgeon: [name], MD;  Location: [facility];  Service: General;  Laterality: N/A;    Current Outpatient Medications  Medication  Sig Dispense Refill   calcium-vitamin D (OSCAL WITH D) 500-200 MG-UNIT tablet Take 1 tablet by mouth every evening. For bone health     cetirizine (ZYRTEC) 10 MG tablet Take 10 mg by mouth 2 (two) times daily.     cholecalciferol (VITAMIN D) 1000 units tablet Take 1 tablet (1,000 Units total) by mouth daily. For bone health     DULoxetine (CYMBALTA) 60 MG capsule Take 1 capsule (60 mg total) by mouth 2 (two) times daily. 180 capsule 0   EPINEPHrine (EPIPEN 2-PAK) 0.3 mg/0.3 mL IJ SOAJ injection Inject 0.3 mg into the muscle as needed for anaphylaxis. 0.3 mL 1   famotidine (PEPCID) 20 MG tablet Take 1 tablet (20 mg total) by mouth daily.     LORazepam (ATIVAN) 0.5 MG tablet Take 1 tablet (0.5 mg total) by mouth daily as needed for anxiety. 15 tablet 0   metoprolol succinate (TOPROL XL) 25 MG 24 hr tablet Take 1 tablet (25 mg total) by mouth daily. 90 tablet 3   montelukast (SINGULAIR) 10 MG tablet TAKE 1 TABLET(10 MG) BY MOUTH AT BEDTIME 90 tablet 0   Multiple Vitamin (MULTI-VITAMIN) tablet Take 1 tablet by mouth daily.     QUEtiapine (SEROQUEL) 50 MG tablet TAKE 3 TABLETS(150 MG) BY MOUTH AT BEDTIME (Patient taking differently: Take 150 mg by mouth at bedtime.) 90 tablet 2   vitamin B-12 (CYANOCOBALAMIN) 500 MCG tablet Take 1 tablet (500 mcg total) by mouth daily. For Vitamin B-12 replacement (Patient taking differently: Take 500 mcg by mouth daily.)     vitamin E 1000 UNIT capsule Take 1,000 Units by mouth daily.     XOLAIR 150 MG injection INJECT 300 MG UNDER THE SKIN EVERY 14 DAYS (Patient taking differently: Inject 150 mg into the skin every 28 (twenty-eight) days.) 4 each 11   Current Facility-Administered Medications  Medication Dose Route Frequency Provider Last Rate Last Admin   omalizumab Arvid Right) injection 300 mg  300 mg Subcutaneous Q28 days Garnet Sierras, DO   300 mg at 10/23/20 1846    Allergies as of 10/24/2020 - Review Complete 10/24/2020  Allergen Reaction Noted   Contrast media  [iodinated diagnostic agents] Hives 09/28/2017   Amoxicillin Hives 10/20/2010   Bactrim [sulfamethoxazole-trimethoprim] Hives and Rash 01/09/2014   Lamictal [lamotrigine] Hives and Rash 08/31/2014    Family History  Problem Relation Age of Onset   Colon cancer Mother 18   Depression Father    Diabetes Father    Heart disease Father    Bladder Cancer Father    Depression Sister    Post-traumatic stress disorder Sister    Anxiety disorder Sister    Eczema Sister    Melanoma Maternal Grandmother    Colon cancer Maternal Grandmother        does not know age of onset   Healthy Maternal Grandfather  Healthy Paternal Grandmother    Heart disease Paternal Grandfather    Stroke Paternal Grandfather    Diabetes Paternal Grandfather    Breast cancer Maternal Aunt    Alcohol abuse Paternal Uncle    Bipolar disorder Cousin     Social History   Socioeconomic History   Marital status: Divorced    Spouse name: Not on file   Number of children: 0   Years of education: 15   Highest education level: Not on file  Occupational History   Occupation: Software engineer   Tobacco Use   Smoking status: Former    Packs/day: 0.10    Years: 1.00    Pack years: 0.10    Types: Cigarettes    Quit date: 04/27/2004    Years since quitting: 16.5   Smokeless tobacco: Never  Vaping Use   Vaping Use: Never used  Substance and Sexual Activity   Alcohol use: Yes    Alcohol/week: 0.0 standard drinks    Comment: Occasional use/ 2 drinks a month   Drug use: No   Sexual activity: Yes    Birth control/protection: Surgical    Comment: 1st intercourse 55 yo-More than 5 partners  Other Topics Concern   Not on file  Social History Narrative   Fun: Color and walk, house renovations   Denies abuse and feels safe at home.    Social Determinants of Health   Financial Resource Strain: Not on file  Food Insecurity: Not on file  Transportation Needs: Not on file  Physical Activity: Not on  file  Stress: Not on file  Social Connections: Not on file  Intimate Partner Violence: Not on file     Physical Exam: BP 100/70   Pulse 76   Ht '5\' 9"'  (1.753 m)   Wt 239 lb 6.4 oz (108.6 kg)   BMI 35.35 kg/m  Constitutional: generally well-appearing Psychiatric: alert and oriented x3 Abdomen: soft, nontender, nondistended, no obvious ascites, no peritoneal signs, normal bowel sounds No peripheral edema noted in lower extremities  Assessment and plan: 55 y.o. 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  She met with Birdie Sons at [facility] yesterday and he agreed with plans for upcoming Whipple surgery which is scheduled for the 25th with Dr. Michaelle Birks.  I will get word to Dr. Zenia Resides that Ms. Mullens is very happy to continue plans for that surgery.  No need for further GI testing.  She can follow-up as needed with Dr. Fuller Plan who is her primary gastroenterologist (or myself if I can be of further assistance)  Please see the "Patient Instructions" section for addition details about the plan.  Owens Loffler, MD [facility] 10/24/2020, 10:25 AM   Total time on date of encounter was 40 minutes (this included time spent preparing to see the patient reviewing records; obtaining and/or reviewing separately obtained history; performing a medically appropriate exam and/or evaluation; counseling and educating the patient and family if present; ordering medications, tests or procedures if applicable; and documenting clinical information in the health record).

## 2020-10-26 ENCOUNTER — Telehealth: Payer: Self-pay | Admitting: Family

## 2020-10-26 ENCOUNTER — Ambulatory Visit: Payer: Self-pay | Admitting: Surgery

## 2020-10-26 NOTE — Telephone Encounter (Signed)
I have called pt back and relayed we have submitted the paperwork and got the confirmation on 10/04/20. I have refaxed everything today. Pt wanted this done since they did not pay her today and they stopped the payments abruptly. Since she was already on her way here. I have given her a copy of the FMLA forms for her records with the fax confirmation attached.

## 2020-10-26 NOTE — Telephone Encounter (Signed)
Patient states she had new FMLa paperwork that was sent over by [employer] the end of June that was due back by  07/07. Guadelupe Sabin stated she will look into it and see if she find anything. I recommended she call [employer] to refax over in the event we never got it. Patient agreed and will give them a call

## 2020-10-30 ENCOUNTER — Ambulatory Visit (INDEPENDENT_AMBULATORY_CARE_PROVIDER_SITE_OTHER): Payer: 59 | Admitting: Psychology

## 2020-10-30 DIAGNOSIS — F331 Major depressive disorder, recurrent, moderate: Secondary | ICD-10-CM | POA: Diagnosis not present

## 2020-10-30 DIAGNOSIS — F411 Generalized anxiety disorder: Secondary | ICD-10-CM

## 2020-10-31 NOTE — Progress Notes (Signed)
Surgical Instructions    Your procedure is scheduled on 11/05/20.  Report to [facility] Main Entrance "A" at 05:30 A.M., then check in with the Admitting office.  Call this number if you have problems the morning of surgery:  [phone]   If you have any questions prior to your surgery date call [phone]: Open Monday-Friday 8am-4pm    Remember:  Do not eat after midnight the night before your surgery  You may drink clear liquids until 04:30am the morning of your surgery.   Clear liquids allowed are: Water, Non-Citrus Juices (without pulp), Carbonated Beverages, Clear Tea, Black Coffee Only, and Gatorade    Take these medicines the morning of surgery with A SIP OF WATER  acetaminophen (TYLENOL) if needed cetirizine (ZYRTEC) DULoxetine (CYMBALTA) famotidine (PEPCID) LORazepam (ATIVAN) if needed metoprolol succinate (TOPROL XL) if taken in the morning (do not take if you take it in the afternoon)    As of today, STOP taking any Aspirin (unless otherwise instructed by your surgeon) Aleve, Naproxen, Ibuprofen, Motrin, Advil, Goody's, BC's, all herbal medications, fish oil, and all vitamins.          Do not wear jewelry or makeup Do not wear lotions, powders, perfumes, or deodorant. Do not shave 48 hours prior to surgery.   Do not bring valuables to the hospital.  DO Not wear nail polish, gel polish, artificial nails, or any other type of covering on natural nails  including finger and toenails. If patients have artificial nails, gel coating, etc. that need to be removed by a nail salon please have this removed prior to surgery or surgery may need to be canceled/delayed if the surgeon/ anesthesia feels like the patient is unable to be adequately monitored.             [place] is not responsible for any belongings or valuables.  Do NOT Smoke (Tobacco/Vaping) or drink Alcohol 24 hours prior to your procedure If you use a CPAP at night, you may bring all equipment for your  overnight stay.   Contacts, glasses, dentures or bridgework may not be worn into surgery, please bring cases for these belongings   For patients admitted to the hospital, discharge time will be determined by your treatment team.   Patients discharged the day of surgery will not be allowed to drive home, and someone needs to stay with them for 24 hours.  ONLY 1 SUPPORT PERSON MAY BE PRESENT WHILE YOU ARE IN SURGERY. IF YOU ARE TO BE ADMITTED ONCE YOU ARE IN YOUR ROOM YOU WILL BE ALLOWED TWO (2) VISITORS.  Minor children may have two parents present. Special consideration for safety and communication needs will be reviewed on a case by case basis.  Special instructions:    Oral Hygiene is also important to reduce your risk of infection.  Remember - BRUSH YOUR TEETH THE MORNING OF SURGERY WITH YOUR REGULAR TOOTHPASTE   [place]- Preparing For Surgery  Before surgery, you can play an important role. Because skin is not sterile, your skin needs to be as free of germs as possible. You can reduce the number of germs on your skin by washing with CHG (chlorahexidine gluconate) Soap before surgery.  CHG is an antiseptic cleaner which kills germs and bonds with the skin to continue killing germs even after washing.     Please do not use if you have an allergy to CHG or antibacterial soaps. If your skin becomes reddened/irritated stop using the CHG.  Do not  shave (including legs and underarms) for at least 48 hours prior to first CHG shower. It is OK to shave your face.  Please follow these instructions carefully.     Shower the NIGHT BEFORE SURGERY and the MORNING OF SURGERY with CHG Soap.   If you chose to wash your hair, wash your hair first as usual with your normal shampoo. After you shampoo, rinse your hair and body thoroughly to remove the shampoo.  Then [employer] and genitals (private parts) with your normal soap and rinse thoroughly to remove soap.  After that Use CHG Soap as you would  any other liquid soap. You can apply CHG directly to the skin and wash gently with a scrungie or a clean washcloth.   Apply the CHG Soap to your body ONLY FROM THE NECK DOWN.  Do not use on open wounds or open sores. Avoid contact with your eyes, ears, mouth and genitals (private parts). Wash Face and genitals (private parts)  with your normal soap.   Wash thoroughly, paying special attention to the area where your surgery will be performed.  Thoroughly rinse your body with warm water from the neck down.  DO NOT shower/wash with your normal soap after using and rinsing off the CHG Soap.  Pat yourself dry with a CLEAN TOWEL.  Wear CLEAN PAJAMAS to bed the night before surgery  Place CLEAN SHEETS on your bed the night before your surgery  DO NOT SLEEP WITH PETS.   Day of Surgery: Take a shower with CHG soap. Wear Clean/Comfortable clothing the morning of surgery Do not apply any deodorants/lotions.   Remember to brush your teeth WITH YOUR REGULAR TOOTHPASTE.   Please read over the following fact sheets that you were given.

## 2020-11-01 ENCOUNTER — Encounter (HOSPITAL_COMMUNITY)
Admission: RE | Admit: 2020-11-01 | Discharge: 2020-11-01 | Disposition: A | Discharge status: Home / Self Care | Payer: 59 | Source: Ambulatory Visit | Attending: Surgery | Admitting: Surgery

## 2020-11-01 ENCOUNTER — Encounter (HOSPITAL_COMMUNITY): Payer: Self-pay

## 2020-11-01 ENCOUNTER — Other Ambulatory Visit: Payer: Self-pay

## 2020-11-01 DIAGNOSIS — Z20822 Contact with and (suspected) exposure to covid-19: Secondary | ICD-10-CM | POA: Insufficient documentation

## 2020-11-01 DIAGNOSIS — Z01812 Encounter for preprocedural laboratory examination: Secondary | ICD-10-CM | POA: Insufficient documentation

## 2020-11-01 LAB — CBC
HCT: 39.9 % (ref 36.0–46.0)
Hemoglobin: 13.3 g/dL (ref 12.0–15.0)
MCH: 32 pg (ref 26.0–34.0)
MCHC: 33.3 g/dL (ref 30.0–36.0)
MCV: 96.1 fL (ref 80.0–100.0)
Platelets: 322 10*3/uL (ref 150–400)
RBC: 4.15 MIL/uL (ref 3.87–5.11)
RDW: 13.6 % (ref 11.5–15.5)
WBC: 6.3 10*3/uL (ref 4.0–10.5)
nRBC: 0 % (ref 0.0–0.2)

## 2020-11-01 LAB — BASIC METABOLIC PANEL
Anion gap: 7 (ref 5–15)
BUN: 7 mg/dL (ref 6–20)
CO2: 25 mmol/L (ref 22–32)
Calcium: 8.6 mg/dL — ABNORMAL LOW (ref 8.9–10.3)
Chloride: 108 mmol/L (ref 98–111)
Creatinine, Ser: 0.55 mg/dL (ref 0.44–1.00)
GFR, Estimated: >60 mL/min (ref >60)
Glucose, Bld: 107 mg/dL — ABNORMAL HIGH (ref 70–99)
Potassium: 3.3 mmol/L — ABNORMAL LOW (ref 3.5–5.1)
Sodium: 140 mmol/L (ref 135–145)

## 2020-11-01 LAB — SARS CORONAVIRUS 2 (TAT 6-24 HRS): SARS Coronavirus 2: NEGATIVE

## 2020-11-01 NOTE — Progress Notes (Signed)
PCP - Jodi Mourning Cardiologist - Berniece Salines Gastroenterologist: Dr. Ardis Hughs  PPM/ICD - denies  Chest x-ray - n/a EKG - 08/31/20 Stress Test - denies ECHO - 10/01/20 Cardiac Cath - denies  Sleep Study - over 10 years ago, recommended a cpap but never picked it up to take home because she couldn't tolerate it in office CPAP - ordered but never brought it home, couldn't tolerate it in the office __PCP has ordered another sleep study but will not be scheduled until after she recovers from this surgery.__  No diabetes  As of today, STOP taking any Aspirin (unless otherwise instructed by your surgeon) Aleve, Naproxen, Ibuprofen, Motrin, Advil, Goody's, BC's, all herbal medications, fish oil, and all vitamins.  ERAS Protcol -yes PRE-SURGERY Ensure or G2- no ensure   COVID TEST- 11/01/20 in PAT   Anesthesia review: yes, recent echo  Patient denies shortness of breath, fever, cough and chest pain at PAT appointment   All instructions explained to the patient, with a verbal understanding of the material. Patient agrees to go over the instructions while at home for a better understanding. Patient also instructed to self quarantine after being tested for COVID-19. The opportunity to ask questions was provided.

## 2020-11-02 LAB — HEPATIC FUNCTION PANEL
ALT: 34 U/L (ref 0–44)
AST: 29 U/L (ref 15–41)
Albumin: 2.4 g/dL — ABNORMAL LOW (ref 3.5–5.0)
Alkaline Phosphatase: 155 U/L — ABNORMAL HIGH (ref 38–126)
Bilirubin, Direct: 0.2 mg/dL (ref 0.0–0.2)
Indirect Bilirubin: 0.6 mg/dL (ref 0.3–0.9)
Total Bilirubin: 0.8 mg/dL (ref 0.3–1.2)
Total Protein: 6 g/dL — ABNORMAL LOW (ref 6.5–8.1)

## 2020-11-04 NOTE — Anesthesia Preprocedure Evaluation (Addendum)
Anesthesia Evaluation  Patient identified by MRN, date of birth, ID band Patient awake    Reviewed: Allergy & Precautions, NPO status , Patient's Chart, lab work & pertinent test results, reviewed documented beta blocker date and time   Airway Mallampati: I  TM Distance: >3 FB Neck ROM: Full    Dental no notable dental hx. (+) Teeth Intact, Dental Advisory Given   Pulmonary sleep apnea (no CPAP) , former smoker,    Pulmonary exam normal breath sounds clear to auscultation       Cardiovascular hypertension, Pt. on home beta blockers and Pt. 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 Currently has interventional radiologically placed biliary drain. Atypical cells were noted on biliary brushing.  Reproductive/Obstetrics                            Anesthesia Physical Anesthesia Plan  ASA: 3  Anesthesia Plan: General and Regional   Post-op Pain Management:  Regional for Post-op pain   Induction: Intravenous  PONV Risk Score and Plan: 3 and Midazolam, Dexamethasone and Ondansetron  Airway Management Planned: Oral ETT  Additional Equipment: Arterial line  Intra-op Plan:   Post-operative Plan: Extubation in OR  Informed Consent: I have reviewed the patients History and Physical, chart, labs and discussed the procedure including the risks, benefits and alternatives for the proposed anesthesia with the patient or authorized representative who  has indicated his/her understanding and acceptance.     Dental advisory given  Plan Discussed with: CRNA  Anesthesia Plan Comments:         Anesthesia Quick Evaluation

## 2020-11-05 ENCOUNTER — Encounter (HOSPITAL_COMMUNITY)
Admission: RE | Disposition: A | Discharge status: Home / Self Care | Payer: Self-pay | Source: Home / Self Care | Attending: Surgery

## 2020-11-05 ENCOUNTER — Inpatient Hospital Stay (HOSPITAL_COMMUNITY): Payer: 59 | Admitting: Certified Registered Nurse Anesthetist

## 2020-11-05 ENCOUNTER — Encounter (HOSPITAL_COMMUNITY): Payer: Self-pay | Admitting: Surgery

## 2020-11-05 ENCOUNTER — Inpatient Hospital Stay (HOSPITAL_COMMUNITY)
Admission: RE | Admit: 2020-11-05 | Discharge: 2020-11-10 | DRG: 407 | Disposition: A | Discharge status: Home / Self Care | Payer: 59 | Attending: Surgery | Admitting: Surgery

## 2020-11-05 ENCOUNTER — Other Ambulatory Visit: Payer: Self-pay

## 2020-11-05 DIAGNOSIS — K760 Fatty (change of) liver, not elsewhere classified: Secondary | ICD-10-CM | POA: Diagnosis present

## 2020-11-05 DIAGNOSIS — G4730 Sleep apnea, unspecified: Secondary | ICD-10-CM | POA: Diagnosis present

## 2020-11-05 DIAGNOSIS — Z800 Family history of malignant neoplasm of digestive organs: Secondary | ICD-10-CM | POA: Diagnosis not present

## 2020-11-05 DIAGNOSIS — K831 Obstruction of bile duct: Secondary | ICD-10-CM | POA: Diagnosis present

## 2020-11-05 DIAGNOSIS — I10 Essential (primary) hypertension: Secondary | ICD-10-CM | POA: Diagnosis present

## 2020-11-05 DIAGNOSIS — Z818 Family history of other mental and behavioral disorders: Secondary | ICD-10-CM | POA: Diagnosis not present

## 2020-11-05 DIAGNOSIS — Z79899 Other long term (current) drug therapy: Secondary | ICD-10-CM

## 2020-11-05 DIAGNOSIS — Z808 Family history of malignant neoplasm of other organs or systems: Secondary | ICD-10-CM

## 2020-11-05 DIAGNOSIS — Z882 Allergy status to sulfonamides status: Secondary | ICD-10-CM

## 2020-11-05 DIAGNOSIS — R7611 Nonspecific reaction to tuberculin skin test without active tuberculosis: Secondary | ICD-10-CM | POA: Diagnosis present

## 2020-11-05 DIAGNOSIS — Z9689 Presence of other specified functional implants: Secondary | ICD-10-CM | POA: Diagnosis present

## 2020-11-05 DIAGNOSIS — Z833 Family history of diabetes mellitus: Secondary | ICD-10-CM | POA: Diagnosis not present

## 2020-11-05 DIAGNOSIS — K869 Disease of pancreas, unspecified: Secondary | ICD-10-CM | POA: Diagnosis present

## 2020-11-05 DIAGNOSIS — Z803 Family history of malignant neoplasm of breast: Secondary | ICD-10-CM | POA: Diagnosis not present

## 2020-11-05 DIAGNOSIS — Z881 Allergy status to other antibiotic agents status: Secondary | ICD-10-CM

## 2020-11-05 DIAGNOSIS — Z6835 Body mass index (BMI) 35.0-35.9, adult: Secondary | ICD-10-CM | POA: Diagnosis not present

## 2020-11-05 DIAGNOSIS — Z9884 Bariatric surgery status: Secondary | ICD-10-CM

## 2020-11-05 DIAGNOSIS — K429 Umbilical hernia without obstruction or gangrene: Secondary | ICD-10-CM | POA: Diagnosis present

## 2020-11-05 DIAGNOSIS — Z823 Family history of stroke: Secondary | ICD-10-CM

## 2020-11-05 DIAGNOSIS — Z91041 Radiographic dye allergy status: Secondary | ICD-10-CM

## 2020-11-05 DIAGNOSIS — Z20822 Contact with and (suspected) exposure to covid-19: Secondary | ICD-10-CM | POA: Diagnosis present

## 2020-11-05 DIAGNOSIS — Z888 Allergy status to other drugs, medicaments and biological substances status: Secondary | ICD-10-CM

## 2020-11-05 DIAGNOSIS — F4310 Post-traumatic stress disorder, unspecified: Secondary | ICD-10-CM | POA: Diagnosis present

## 2020-11-05 DIAGNOSIS — Z87891 Personal history of nicotine dependence: Secondary | ICD-10-CM

## 2020-11-05 DIAGNOSIS — Z8249 Family history of ischemic heart disease and other diseases of the circulatory system: Secondary | ICD-10-CM | POA: Diagnosis not present

## 2020-11-05 DIAGNOSIS — G43909 Migraine, unspecified, not intractable, without status migrainosus: Secondary | ICD-10-CM | POA: Diagnosis present

## 2020-11-05 DIAGNOSIS — Z8052 Family history of malignant neoplasm of bladder: Secondary | ICD-10-CM | POA: Diagnosis not present

## 2020-11-05 DIAGNOSIS — K449 Diaphragmatic hernia without obstruction or gangrene: Secondary | ICD-10-CM | POA: Diagnosis present

## 2020-11-05 DIAGNOSIS — R339 Retention of urine, unspecified: Secondary | ICD-10-CM | POA: Diagnosis not present

## 2020-11-05 DIAGNOSIS — D1803 Hemangioma of intra-abdominal structures: Secondary | ICD-10-CM | POA: Diagnosis present

## 2020-11-05 HISTORY — PX: WHIPPLE PROCEDURE: SHX2667

## 2020-11-05 LAB — CBC
HCT: 29.3 % — ABNORMAL LOW (ref 36.0–46.0)
Hemoglobin: 9.9 g/dL — ABNORMAL LOW (ref 12.0–15.0)
MCH: 32.7 pg (ref 26.0–34.0)
MCHC: 33.8 g/dL (ref 30.0–36.0)
MCV: 96.7 fL (ref 80.0–100.0)
Platelets: 216 10*3/uL (ref 150–400)
RBC: 3.03 MIL/uL — ABNORMAL LOW (ref 3.87–5.11)
RDW: 13.6 % (ref 11.5–15.5)
WBC: 9.3 10*3/uL (ref 4.0–10.5)
nRBC: 0 % (ref 0.0–0.2)

## 2020-11-05 LAB — GLUCOSE, CAPILLARY
Glucose-Capillary: 125 mg/dL — ABNORMAL HIGH (ref 70–99)
Glucose-Capillary: 127 mg/dL — ABNORMAL HIGH (ref 70–99)
Glucose-Capillary: 127 mg/dL — ABNORMAL HIGH (ref 70–99)

## 2020-11-05 LAB — POCT I-STAT 7, (LYTES, BLD GAS, ICA,H+H)
Acid-Base Excess: 0 mmol/L (ref 0.0–2.0)
Acid-base deficit: 1 mmol/L (ref 0.0–2.0)
Bicarbonate: 25.7 mmol/L (ref 20.0–28.0)
Bicarbonate: 25.6 mmol/L (ref 20.0–28.0)
Calcium, Ion: 1.15 mmol/L (ref 1.15–1.40)
Calcium, Ion: 1.16 mmol/L (ref 1.15–1.40)
HCT: 30 % — ABNORMAL LOW (ref 36.0–46.0)
HCT: 27 % — ABNORMAL LOW (ref 36.0–46.0)
Hemoglobin: 10.2 g/dL — ABNORMAL LOW (ref 12.0–15.0)
Hemoglobin: 9.2 g/dL — ABNORMAL LOW (ref 12.0–15.0)
O2 Saturation: 100 %
O2 Saturation: 100 %
Potassium: 3.1 mmol/L — ABNORMAL LOW (ref 3.5–5.1)
Potassium: 3.1 mmol/L — ABNORMAL LOW (ref 3.5–5.1)
Sodium: 142 mmol/L (ref 135–145)
Sodium: 141 mmol/L (ref 135–145)
TCO2: 27 mmol/L (ref 22–32)
TCO2: 27 mmol/L (ref 22–32)
pCO2 arterial: 49.6 mmHg — ABNORMAL HIGH (ref 32.0–48.0)
pCO2 arterial: 47 mmHg (ref 32.0–48.0)
pH, Arterial: 7.323 — ABNORMAL LOW (ref 7.350–7.450)
pH, Arterial: 7.344 — ABNORMAL LOW (ref 7.350–7.450)
pO2, Arterial: 184 mmHg — ABNORMAL HIGH (ref 83.0–108.0)
pO2, Arterial: 209 mmHg — ABNORMAL HIGH (ref 83.0–108.0)

## 2020-11-05 LAB — COMPREHENSIVE METABOLIC PANEL
ALT: 68 U/L — ABNORMAL HIGH (ref 0–44)
AST: 123 U/L — ABNORMAL HIGH (ref 15–41)
Albumin: 2.2 g/dL — ABNORMAL LOW (ref 3.5–5.0)
Alkaline Phosphatase: 133 U/L — ABNORMAL HIGH (ref 38–126)
Anion gap: 11 (ref 5–15)
BUN: 5 mg/dL — ABNORMAL LOW (ref 6–20)
CO2: 21 mmol/L — ABNORMAL LOW (ref 22–32)
Calcium: 7.5 mg/dL — ABNORMAL LOW (ref 8.9–10.3)
Chloride: 107 mmol/L (ref 98–111)
Creatinine, Ser: 0.53 mg/dL (ref 0.44–1.00)
GFR, Estimated: >60 mL/min (ref >60)
Glucose, Bld: 143 mg/dL — ABNORMAL HIGH (ref 70–99)
Potassium: 3.2 mmol/L — ABNORMAL LOW (ref 3.5–5.1)
Sodium: 139 mmol/L (ref 135–145)
Total Bilirubin: 0.8 mg/dL (ref 0.3–1.2)
Total Protein: 4.3 g/dL — ABNORMAL LOW (ref 6.5–8.1)

## 2020-11-05 LAB — PREPARE RBC (CROSSMATCH)

## 2020-11-05 LAB — MRSA NEXT GEN BY PCR, NASAL: MRSA by PCR Next Gen: NOT DETECTED

## 2020-11-05 SURGERY — WHIPPLE PROCEDURE
Anesthesia: Regional | Site: Abdomen

## 2020-11-05 MED ORDER — DIPHENHYDRAMINE HCL 50 MG/ML IJ SOLN: 12.5000 mg | Freq: Four times a day (QID) | INTRAMUSCULAR | Status: DC | PRN

## 2020-11-05 MED ORDER — METOPROLOL SUCCINATE ER 25 MG PO TB24: 25.0000 mg | ORAL_TABLET | Freq: Every day | ORAL | Status: DC

## 2020-11-05 MED ORDER — ONDANSETRON HCL 4 MG/2ML IJ SOLN: 4.0000 mg | Freq: Four times a day (QID) | INTRAMUSCULAR | Status: DC | PRN

## 2020-11-05 MED ORDER — EPHEDRINE SULFATE-NACL 50-0.9 MG/10ML-% IV SOSY
PREFILLED_SYRINGE | INTRAVENOUS | Status: DC | PRN
  Administered 2020-11-05 (×2): 5 mg via INTRAVENOUS
  Administered 2020-11-05: 10 mg via INTRAVENOUS
  Administered 2020-11-05: 5 mg via INTRAVENOUS

## 2020-11-05 MED ORDER — INSULIN ASPART 100 UNIT/ML IJ SOLN
0.0000 [IU] | INTRAMUSCULAR | Status: DC
Start: 2020-11-05 — End: 2020-11-05

## 2020-11-05 MED ORDER — ACETAMINOPHEN 500 MG PO TABS
1000.0000 mg | ORAL_TABLET | Freq: Three times a day (TID) | ORAL | Status: DC
  Administered 2020-11-05 – 2020-11-10 (×15): 1000 mg via ORAL
  Filled 2020-11-05 (×16): qty 2

## 2020-11-05 MED ORDER — LACTATED RINGERS IV BOLUS
500.0000 mL | Freq: Once | INTRAVENOUS | Status: AC
  Administered 2020-11-05: 500 mL via INTRAVENOUS

## 2020-11-05 MED ORDER — ROPIVACAINE HCL 2 MG/ML IJ SOLN
4.0000 mL/h | INTRAMUSCULAR | Status: AC
  Administered 2020-11-05: 8 mL/h via EPIDURAL
  Filled 2020-11-05: qty 200

## 2020-11-05 MED ORDER — 0.9 % SODIUM CHLORIDE (POUR BTL) OPTIME
TOPICAL | Status: DC | PRN
  Administered 2020-11-05 (×4): 1000 mL

## 2020-11-05 MED ORDER — CHLORHEXIDINE GLUCONATE 0.12 % MT SOLN
15.0000 mL | Freq: Once | OROMUCOSAL | Status: AC
  Administered 2020-11-05: 15 mL via OROMUCOSAL
  Filled 2020-11-05: qty 15

## 2020-11-05 MED ORDER — ROPIVACAINE HCL 2 MG/ML IJ SOLN
6.0000 mL/h | INTRAMUSCULAR | Status: DC
  Administered 2020-11-06 – 2020-11-07 (×2): 6 mL/h via EPIDURAL
  Filled 2020-11-05 (×4): qty 200

## 2020-11-05 MED ORDER — HYDROMORPHONE 1 MG/ML IV SOLN
INTRAVENOUS | Status: DC
  Administered 2020-11-05: 30 mg via INTRAVENOUS
  Administered 2020-11-06: 0.8 mg via INTRAVENOUS
  Administered 2020-11-06: 30 mg via INTRAVENOUS
  Administered 2020-11-06: 1.2 mg via INTRAVENOUS
  Administered 2020-11-06: 0.8 mg via INTRAVENOUS
  Administered 2020-11-07 (×2): 0.2 mg via INTRAVENOUS
  Filled 2020-11-05 (×5): qty 30

## 2020-11-05 MED ORDER — ACETAMINOPHEN 500 MG PO TABS
1000.0000 mg | ORAL_TABLET | Freq: Once | ORAL | Status: AC
  Administered 2020-11-05: 1000 mg via ORAL
  Filled 2020-11-05: qty 2

## 2020-11-05 MED ORDER — ALBUMIN HUMAN 5 % IV SOLN: INTRAVENOUS | Status: DC | PRN

## 2020-11-05 MED ORDER — LIDOCAINE 2% (20 MG/ML) 5 ML SYRINGE
INTRAMUSCULAR | Status: AC
  Filled 2020-11-05: qty 5

## 2020-11-05 MED ORDER — GLYCOPYRROLATE PF 0.2 MG/ML IJ SOSY
PREFILLED_SYRINGE | INTRAMUSCULAR | Status: AC
  Filled 2020-11-05: qty 1

## 2020-11-05 MED ORDER — LORAZEPAM 0.5 MG PO TABS: 0.5000 mg | ORAL_TABLET | Freq: Every day | ORAL | Status: DC | PRN

## 2020-11-05 MED ORDER — ROCURONIUM BROMIDE 100 MG/10ML IV SOLN
INTRAVENOUS | Status: DC | PRN
Start: 2020-11-05 — End: 2020-11-05
  Administered 2020-11-05: 10 mg via INTRAVENOUS
  Administered 2020-11-05: 30 mg via INTRAVENOUS
  Administered 2020-11-05 (×2): 10 mg via INTRAVENOUS
  Administered 2020-11-05: 100 mg via INTRAVENOUS
  Administered 2020-11-05: 20 mg via INTRAVENOUS

## 2020-11-05 MED ORDER — ONDANSETRON HCL 4 MG/2ML IJ SOLN
INTRAMUSCULAR | Status: AC
  Filled 2020-11-05: qty 2

## 2020-11-05 MED ORDER — GLYCOPYRROLATE PF 0.2 MG/ML IJ SOSY
PREFILLED_SYRINGE | INTRAMUSCULAR | Status: DC | PRN
  Administered 2020-11-05 (×2): .1 mg via INTRAVENOUS

## 2020-11-05 MED ORDER — POTASSIUM CHLORIDE 10 MEQ/100ML IV SOLN
10.0000 meq | INTRAVENOUS | Status: AC
  Administered 2020-11-05 (×4): 10 meq via INTRAVENOUS
  Filled 2020-11-05: qty 100

## 2020-11-05 MED ORDER — HYDROMORPHONE 1 MG/ML IV SOLN
INTRAVENOUS | Status: DC
Start: 2020-11-05 — End: 2020-11-05

## 2020-11-05 MED ORDER — LIDOCAINE 2% (20 MG/ML) 5 ML SYRINGE
INTRAMUSCULAR | Status: DC | PRN
Start: 2020-11-05 — End: 2020-11-05
  Administered 2020-11-05: 40 mg via INTRAVENOUS

## 2020-11-05 MED ORDER — PHENYLEPHRINE 40 MCG/ML (10ML) SYRINGE FOR IV PUSH (FOR BLOOD PRESSURE SUPPORT)
PREFILLED_SYRINGE | INTRAVENOUS | Status: AC
  Filled 2020-11-05: qty 10

## 2020-11-05 MED ORDER — PROPOFOL 10 MG/ML IV BOLUS
INTRAVENOUS | Status: DC | PRN
  Administered 2020-11-05: 120 mg via INTRAVENOUS

## 2020-11-05 MED ORDER — PANTOPRAZOLE SODIUM 40 MG IV SOLR
40.0000 mg | INTRAVENOUS | Status: DC
  Administered 2020-11-05 – 2020-11-06 (×2): 40 mg via INTRAVENOUS
  Filled 2020-11-05 (×2): qty 40

## 2020-11-05 MED ORDER — FENTANYL CITRATE (PF) 100 MCG/2ML IJ SOLN: 25.0000 ug | INTRAMUSCULAR | Status: DC | PRN

## 2020-11-05 MED ORDER — PHENYLEPHRINE 40 MCG/ML (10ML) SYRINGE FOR IV PUSH (FOR BLOOD PRESSURE SUPPORT)
PREFILLED_SYRINGE | INTRAVENOUS | Status: DC | PRN
  Administered 2020-11-05: 80 ug via INTRAVENOUS
  Administered 2020-11-05: 160 ug via INTRAVENOUS
  Administered 2020-11-05: 40 ug via INTRAVENOUS
  Administered 2020-11-05: 120 ug via INTRAVENOUS
  Administered 2020-11-05: 80 ug via INTRAVENOUS

## 2020-11-05 MED ORDER — HEPARIN SODIUM (PORCINE) 5000 UNIT/ML IJ SOLN
5000.0000 [IU] | Freq: Three times a day (TID) | INTRAMUSCULAR | Status: DC
  Administered 2020-11-06 – 2020-11-08 (×6): 5000 [IU] via SUBCUTANEOUS
  Filled 2020-11-05 (×6): qty 1

## 2020-11-05 MED ORDER — DIPHENHYDRAMINE HCL 12.5 MG/5ML PO ELIX: 12.5000 mg | ORAL_SOLUTION | Freq: Four times a day (QID) | ORAL | Status: DC | PRN

## 2020-11-05 MED ORDER — SUGAMMADEX SODIUM 200 MG/2ML IV SOLN
INTRAVENOUS | Status: DC | PRN
  Administered 2020-11-05: 200 mg via INTRAVENOUS

## 2020-11-05 MED ORDER — BUPIVACAINE HCL (PF) 0.5 % IJ SOLN
INTRAMUSCULAR | Status: DC | PRN
  Administered 2020-11-05: 5 mL

## 2020-11-05 MED ORDER — LACTATED RINGERS IV SOLN
INTRAVENOUS | Status: DC | PRN
Start: 2020-11-05 — End: 2020-11-05

## 2020-11-05 MED ORDER — LACTATED RINGERS IV SOLN: INTRAVENOUS | Status: DC

## 2020-11-05 MED ORDER — SODIUM CHLORIDE 0.9% FLUSH: 9.0000 mL | INTRAVENOUS | Status: DC | PRN

## 2020-11-05 MED ORDER — DEXAMETHASONE SODIUM PHOSPHATE 10 MG/ML IJ SOLN
INTRAMUSCULAR | Status: DC | PRN
  Administered 2020-11-05: 10 mg via INTRAVENOUS

## 2020-11-05 MED ORDER — SODIUM CHLORIDE 0.9 % IV SOLN
2.0000 g | INTRAVENOUS | Status: AC
  Administered 2020-11-05: 2 g via INTRAVENOUS
  Filled 2020-11-05: qty 20

## 2020-11-05 MED ORDER — MIDAZOLAM HCL 2 MG/2ML IJ SOLN
INTRAMUSCULAR | Status: AC
  Filled 2020-11-05: qty 2

## 2020-11-05 MED ORDER — ORAL CARE MOUTH RINSE: 15.0000 mL | Freq: Once | OROMUCOSAL | Status: AC

## 2020-11-05 MED ORDER — DEXAMETHASONE SODIUM PHOSPHATE 10 MG/ML IJ SOLN
INTRAMUSCULAR | Status: AC
  Filled 2020-11-05: qty 1

## 2020-11-05 MED ORDER — ONDANSETRON HCL 4 MG/2ML IJ SOLN
INTRAMUSCULAR | Status: DC | PRN
  Administered 2020-11-05: 4 mg via INTRAVENOUS

## 2020-11-05 MED ORDER — FENTANYL CITRATE (PF) 100 MCG/2ML IJ SOLN
INTRAMUSCULAR | Status: DC | PRN
  Administered 2020-11-05 (×3): 50 ug via INTRAVENOUS

## 2020-11-05 MED ORDER — ROCURONIUM BROMIDE 10 MG/ML (PF) SYRINGE
PREFILLED_SYRINGE | INTRAVENOUS | Status: AC
  Filled 2020-11-05: qty 10

## 2020-11-05 MED ORDER — FENTANYL CITRATE (PF) 250 MCG/5ML IJ SOLN
INTRAMUSCULAR | Status: AC
  Filled 2020-11-05: qty 5

## 2020-11-05 MED ORDER — MONTELUKAST SODIUM 10 MG PO TABS
10.0000 mg | ORAL_TABLET | Freq: Every day | ORAL | Status: DC
  Administered 2020-11-05 – 2020-11-09 (×5): 10 mg via ORAL
  Filled 2020-11-05 (×6): qty 1

## 2020-11-05 MED ORDER — PHENYLEPHRINE HCL-NACL 10-0.9 MG/250ML-% IV SOLN
INTRAVENOUS | Status: DC | PRN
  Administered 2020-11-05: 20 ug/min via INTRAVENOUS

## 2020-11-05 MED ORDER — CHLORHEXIDINE GLUCONATE CLOTH 2 % EX PADS
6.0000 | MEDICATED_PAD | Freq: Every day | CUTANEOUS | Status: DC
  Administered 2020-11-05 – 2020-11-08 (×4): 6 via TOPICAL

## 2020-11-05 MED ORDER — INSULIN ASPART 100 UNIT/ML IJ SOLN
0.0000 [IU] | INTRAMUSCULAR | Status: DC
  Administered 2020-11-05 – 2020-11-07 (×4): 2 [IU] via SUBCUTANEOUS

## 2020-11-05 MED ORDER — HEMOSTATIC AGENTS (NO CHARGE) OPTIME
TOPICAL | Status: DC | PRN
  Administered 2020-11-05: 1 via TOPICAL

## 2020-11-05 MED ORDER — MIDAZOLAM HCL 5 MG/5ML IJ SOLN
INTRAMUSCULAR | Status: DC | PRN
  Administered 2020-11-05: 2 mg via INTRAVENOUS

## 2020-11-05 MED ORDER — DULOXETINE HCL 60 MG PO CPEP
60.0000 mg | ORAL_CAPSULE | Freq: Two times a day (BID) | ORAL | Status: DC
  Administered 2020-11-05 – 2020-11-10 (×10): 60 mg via ORAL
  Filled 2020-11-05 (×9): qty 1

## 2020-11-05 MED ORDER — METRONIDAZOLE 500 MG/100ML IV SOLN
500.0000 mg | INTRAVENOUS | Status: AC
  Administered 2020-11-05: 500 mg via INTRAVENOUS
  Filled 2020-11-05: qty 100

## 2020-11-05 MED ORDER — NALOXONE HCL 0.4 MG/ML IJ SOLN: 0.4000 mg | INTRAMUSCULAR | Status: DC | PRN

## 2020-11-05 MED ORDER — PHENYLEPHRINE HCL (PRESSORS) 10 MG/ML IV SOLN
INTRAVENOUS | Status: AC
  Filled 2020-11-05: qty 1

## 2020-11-05 MED ORDER — QUETIAPINE FUMARATE 50 MG PO TABS
150.0000 mg | ORAL_TABLET | Freq: Every day | ORAL | Status: DC
  Administered 2020-11-05 – 2020-11-09 (×5): 150 mg via ORAL
  Filled 2020-11-05 (×4): qty 3
  Filled 2020-11-05: qty 6
  Filled 2020-11-05: qty 3
  Filled 2020-11-05: qty 6
  Filled 2020-11-05: qty 3
  Filled 2020-11-05: qty 6

## 2020-11-05 MED ORDER — PROPOFOL 10 MG/ML IV BOLUS
INTRAVENOUS | Status: AC
  Filled 2020-11-05: qty 40

## 2020-11-05 SURGICAL SUPPLY — 90 items
BAG BILE T-TUBES STRL (MISCELLANEOUS) IMPLANT
BAG COUNTER SPONGE SURGICOUNT (BAG) ×4 IMPLANT
BAG DRAINAGE 600ML DEPOT (BAG) ×2 IMPLANT
BIOPATCH RED 1 DISK 7.0 (GAUZE/BANDAGES/DRESSINGS) ×4 IMPLANT
BLADE CLIPPER SURG (BLADE) IMPLANT
BLADE SURG 10 STRL SS (BLADE) ×2 IMPLANT
BOOT SUTURE AID YELLOW STND (SUTURE) ×4 IMPLANT
CANISTER SUCT 3000ML PPV (MISCELLANEOUS) IMPLANT
CHLORAPREP W/TINT 26 (MISCELLANEOUS) ×2 IMPLANT
CLIP VESOCCLUDE LG 6/CT (CLIP) ×2 IMPLANT
CLIP VESOCCLUDE MED 6/CT (CLIP) ×2 IMPLANT
CLIP VESOCCLUDE SM WIDE 6/CT (CLIP) ×2 IMPLANT
CNTNR URN SCR LID CUP LEK RST (MISCELLANEOUS) ×3 IMPLANT
CONT SPEC 4OZ STRL OR WHT (MISCELLANEOUS) ×6
COUNTER NEEDLE 20 DBL MAG RED (NEEDLE) ×2 IMPLANT
COVER MAYO STAND STRL (DRAPES) ×2 IMPLANT
COVER SURGICAL LIGHT HANDLE (MISCELLANEOUS) ×2 IMPLANT
DERMABOND ADVANCED (GAUZE/BANDAGES/DRESSINGS) ×2
DERMABOND ADVANCED .7 DNX12 (GAUZE/BANDAGES/DRESSINGS) ×2 IMPLANT
DRAIN CHANNEL 19F RND (DRAIN) ×4 IMPLANT
DRAIN PENROSE 0.5X18 (DRAIN) ×2 IMPLANT
DRAPE INCISE IOBAN 66X45 STRL (DRAPES) ×2 IMPLANT
DRAPE LAPAROSCOPIC ABDOMINAL (DRAPES) ×2 IMPLANT
DRAPE WARM FLUID 44X44 (DRAPES) ×2 IMPLANT
DRSG TEGADERM 4X4.75 (GAUZE/BANDAGES/DRESSINGS) ×4 IMPLANT
DRSG TELFA 3X8 NADH (GAUZE/BANDAGES/DRESSINGS) IMPLANT
ELECT BLADE 4.0 EZ CLEAN MEGAD (MISCELLANEOUS) ×2
ELECT CAUTERY BLADE 6.4 (BLADE) ×2 IMPLANT
ELECT NEEDLE BLADE 2-5/6 (NEEDLE) ×2 IMPLANT
ELECT PAD DSPR THERM+ ADLT (MISCELLANEOUS) ×2 IMPLANT
ELECT REM PT RETURN 9FT ADLT (ELECTROSURGICAL) ×2
ELECTRODE BLDE 4.0 EZ CLN MEGD (MISCELLANEOUS) ×1 IMPLANT
ELECTRODE REM PT RTRN 9FT ADLT (ELECTROSURGICAL) ×1 IMPLANT
EVACUATOR SILICONE 100CC (DRAIN) ×4 IMPLANT
GLOVE SURG SYN 5.5 (GLOVE) ×2 IMPLANT
GOWN STRL REUS W/ TWL LRG LVL3 (GOWN DISPOSABLE) ×5 IMPLANT
GOWN STRL REUS W/TWL LRG LVL3 (GOWN DISPOSABLE) ×10
HAND PENCIL TRP OPTION (MISCELLANEOUS) ×2 IMPLANT
HANDLE SUCTION POOLE (INSTRUMENTS) ×1 IMPLANT
HEMOSTAT SNOW SURGICEL 2X4 (HEMOSTASIS) ×2 IMPLANT
HEMOSTAT SURGICEL 2X14 (HEMOSTASIS) IMPLANT
KIT BASIN OR (CUSTOM PROCEDURE TRAY) ×2 IMPLANT
KIT TUBE JEJUNAL 16FR (CATHETERS) IMPLANT
KIT TURNOVER KIT B (KITS) ×2 IMPLANT
LOOP VESSEL MAXI BLUE (MISCELLANEOUS) ×2 IMPLANT
LOOP VESSEL MINI RED (MISCELLANEOUS) ×2 IMPLANT
MARKER SKIN DUAL TIP RULER LAB (MISCELLANEOUS) ×2 IMPLANT
NS IRRIG 1000ML POUR BTL (IV SOLUTION) ×4 IMPLANT
PACK GENERAL/GYN (CUSTOM PROCEDURE TRAY) ×2 IMPLANT
PAD ARMBOARD 7.5X6 YLW CONV (MISCELLANEOUS) ×4 IMPLANT
PENCIL SMOKE EVACUATOR (MISCELLANEOUS) ×2 IMPLANT
RELOAD PROXIMATE 75MM BLUE (ENDOMECHANICALS) ×2 IMPLANT
RELOAD PROXIMATE 75MM GREEN (ENDOMECHANICALS) IMPLANT
RETRACTOR WOUND ALXS 34CM XLRG (MISCELLANEOUS) ×1 IMPLANT
RTRCTR WOUND ALEXIS 34CM XLRG (MISCELLANEOUS) ×2
SHEARS FOC LG CVD HARMONIC 17C (MISCELLANEOUS) ×2 IMPLANT
SPONGE T-LAP 18X18 ~~LOC~~+RFID (SPONGE) ×6 IMPLANT
STAPLER PROXIMATE 75MM BLUE (STAPLE) ×2 IMPLANT
STAPLER VISISTAT 35W (STAPLE) IMPLANT
SUCTION POOLE HANDLE (INSTRUMENTS) ×2
SUT 5.0 PDS RB-1 (SUTURE)
SUT ETHILON 2 0 FS 18 (SUTURE) ×4 IMPLANT
SUT ETHILON 2 LR (SUTURE) IMPLANT
SUT MNCRL AB 4-0 PS2 18 (SUTURE) ×2 IMPLANT
SUT NOVA 1 T20/GS 25DT (SUTURE) ×10 IMPLANT
SUT PDS AB 1 TP1 96 (SUTURE) IMPLANT
SUT PDS AB 3-0 SH 27 (SUTURE) IMPLANT
SUT PDS AB 4-0 RB1 27 (SUTURE) ×26 IMPLANT
SUT PDS II 5-0 RB-2 VIOLET (SUTURE) ×12 IMPLANT
SUT PDS PLUS AB 5-0 RB-1 (SUTURE) IMPLANT
SUT PROLENE 3 0 SH 48 (SUTURE) ×2 IMPLANT
SUT PROLENE 4 0 RB 1 (SUTURE) ×6
SUT PROLENE 4-0 RB1 .5 CRCL 36 (SUTURE) ×3 IMPLANT
SUT SILK 2 0 TIES 10X30 (SUTURE) ×4 IMPLANT
SUT SILK 2 0SH CR/8 30 (SUTURE) ×2 IMPLANT
SUT SILK 3 0 TIES 10X30 (SUTURE) ×2 IMPLANT
SUT SILK 3 0SH CR/8 30 (SUTURE) ×6 IMPLANT
SUT VIC AB 2-0 CT1 27 (SUTURE)
SUT VIC AB 2-0 CT1 TAPERPNT 27 (SUTURE) IMPLANT
SUT VIC AB 2-0 SH 18 (SUTURE) IMPLANT
SUT VIC AB 3-0 MH 27 (SUTURE) ×12 IMPLANT
SUT VIC AB 3-0 SH 18 (SUTURE) ×2 IMPLANT
SUT VIC AB 3-0 SH 27 (SUTURE) ×4
SUT VIC AB 3-0 SH 27X BRD (SUTURE) ×2 IMPLANT
SUT VICRYL AB 2 0 TIES (SUTURE) IMPLANT
TOWEL GREEN STERILE (TOWEL DISPOSABLE) ×2 IMPLANT
TOWEL GREEN STERILE FF (TOWEL DISPOSABLE) ×2 IMPLANT
TRAY FOLEY MTR SLVR 14FR STAT (SET/KITS/TRAYS/PACK) ×2 IMPLANT
TUBE FEEDING 8FR 16IN STR KANG (MISCELLANEOUS) IMPLANT
TUBE FEEDING ENTERAL 5FR 16IN (TUBING) IMPLANT

## 2020-11-05 NOTE — Anesthesia Procedure Notes (Addendum)
Arterial Line Insertion Start/End7/25/2022 7:10 AM, 11/05/2020 7:15 AM Performed by: Freddrick March, MD, Hessie Dibble T, CRNA, CRNA  Preanesthetic checklist: patient identified, IV checked, site marked, risks and benefits discussed, surgical consent, monitors and equipment checked, pre-op evaluation, timeout performed and anesthesia consent Lidocaine 1% used for infiltration Left, radial was placed Catheter size: 20 G Maximum sterile barriers used   Attempts: 1 Procedure performed without using ultrasound guided technique. Following insertion, dressing applied and Biopatch. Post procedure assessment: normal  Patient tolerated the procedure well with no immediate complications.

## 2020-11-05 NOTE — Progress Notes (Addendum)
MD made aware of low BP. Verbalized a MAP above 60 was ok. Will continue to monitor.

## 2020-11-05 NOTE — Op Note (Signed)
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 EUS/ERCP were not successful due to her altered anatomy, thus a PTC was performed and showed a stricture in the distal common bile duct. Brushings of the stricture showed atypical cells but were not diagnostic for malignancy. A transhepatic internal/external biliary drain was placed. The patient endorsed fatigue and weight loss over the last several months, and her CA19-9 was elevated even after normalization of her bilirubin. After an extensive discussion of treatment options, resection was recommended as it was felt that this stricture likely represents a malignancy. The patient agreed to proceed with surgery.  Findings: Umbilical hernia. Hepatic steatosis with a benign hemangioma in the left lateral liver. Firm pancreas with a 15mm duct and an ill-defined mass in the head of the gland abutting the SMV/PV. Initial pancreatic neck margin showed atypical cells on frozen section so an additional margin was sent, which was negative for atypica, dysplasia and malignancy. Benign common bile duct margin on frozen section. Following resection, the original biliary limb was brought up into the RUQ to create the pancreatic and biliary anastomosis. The excluded stomach was anastomosed to the pancreaticobiliary limb, without altering the roux limb or jejuno-jejunal  anastomosis.  Procedure details: Informed consent was obtained in the preoperative area prior to the procedure. The patient was brought to the operating room and placed on the table in the supine position. General anesthesia was induced and appropriate lines and drains were placed for intraoperative monitoring. Perioperative antibiotics were administered per SCIP guidelines. The abdomen was prepped and draped in the usual sterile fashion. A pre-procedure timeout was taken verifying patient identity, surgical site and procedure to be performed.  An upper midline skin incision was made and the subcutaneous tissue was divided with cautery to expose the fascia. The fascia was opened along the linea alba. There was a large hernia defect at the umbilicus at the site of a prior surgical scar. The falciform ligament was ligated with 2-0 silk ties and divided. The liver appeared steatotic and had a well-circumscribed mass consistent with a benign hemangioma in the left lateral segment, but no evidence of metastatic disease. A Bookwalter fixed retractor and Allexis wound protector were placed. The antecolic roux limb was identified and followed down to the JJ anastomosis, and the biliary limb was then followed up to the ligament of Treitz. There were no small bowel adhesions. The duodenum was widely kocherized to expose the IVC and aorta. The head of the pancreas felt firm, but the SMA felt soft and free of disease. Next the gastrocolic omentum was divided with harmonic shears to open the lesser sac. The inferior border of the pancreas was identified and dissected out to expose the SMV. The body and tail of the pancreas were firm and atrophied, but there were no palpable masses. The gallbladder was taken off the liver in a dome down fashion using cautery. The cystic artery was clipped and divided, and the cystic duct was ligated with a 2-0 tie and divided. The gallbladder was  passed off the field and sent for routine  pathology. Next the portal dissection was started. The station 8 lymph node was taken off the common hepatic artery and sent for routine pathology. The common bile duct was circumferentially dissected out, sweeping the adjacent lymph nodes down towards the duodenum with the planned specimen. The periportal lymph nodes were mildly enlarged but soft. The common bile duct was divided just distal to the cystic duct insertion using cautery. The previously placed percutaneous biliary drain was cut to shorten its length, but left in place in the common bile duct. The distal bile duct margin was sent for frozen section and was negative for malignant cells. The gastroduodenal artery was then circumferentially dissected out, and on occlusion a pulse remained in the proper hepatic artery. The GDA was doubly  ligated with 2-0 silk ties and a 4-0 prolene suture ligature and divided. The gastroepiploic vessels were then ligated with 2-0 silk suture on the distal greater curve of the excluded stomach. The excluded stomach was transected proximal to the pylorus with a 57mm GIA blue load stapler.  A plane was then created between the anterior surface of the portal vein and the neck of the pancreas using gentle blunt dissection. The head of pancreas was somewhat adherent to the portal vein/SMV along the right lateral aspect, but a plane was able to be created over the anterior surface of the vein posterior to the neck of the pancreas. The neck of the pancreas was partially divided using cautery, and the posterior portion was transected sharply with a scalpel. The pancreatic neck margin was sent for frozen section and showed atypical cells. An additional margin of pancreatic tissue approximately 1cm in thickness was taken from the pancreatic remnant and the distal margin was sent for frozen section. This new margin was negative for malignancy, dysplasia and atypia. A mesenteric window was created on the biliary limb of the jejunum  just distal to the ligament of Treitz, and the bowel was transected at this point using a 27mm GIA blue load stapler. The jejunal mesentery was divided with Harmonic shears, and the ligament of Treitz was taken down with cautery. The proximal jejunum was then brought underneath the mesocolon into the RUQ. The head of the pancreas was adherent to the right lateral aspect of the SMV/PV confluence. The portal vein, origin of the splenic vein, and the SMV were each encircled with a vessel loop to obtain complete vascular control. The head of the pancreas was then very gently peeled off the PV/SMV using blunt dissection. There was no evidence of direct tumor extension into the vein. The uncinate process of the pancreas was then carefully dissected off the retroperitoneum and SMA. The uncinate and adjacent lymphatic tissue were taken off the SMA using harmonic shears. Larger branch vessels were clamped and tied with 2-0 silk ties. The specimen was removed and oriented, and sent for routine pathology. At the completion of this dissection there were palpable pulses in the SMA, common hepatic artery, and hepatic artery proper.   The abdomen was irrigated with warmed saline and hemostasis was achieved. For reconstruction the distal transected end of the jejunum, from the original biliary limb, was brought up through the ligament of Treitz defect to the RUQ. It was placed adjacent to the remnant pancreas and bile duct and it was clear that there would be enough length to allow reconstruction of the pancreas, bile duct and excluded stomach to this limb of jejunum proximal to the jejunojejunal anastomosis,  without having to take down the JJ or create a new biliary limb. The end of the jejunum was laid next to the transected neck of the pancreas. A pancreaticojejunostomy was created in Blumgart fashion using 3-0 vicryl transpancreatic sutures to the jejunum, and 5-0 PDS interrupted duct-to-mucosal sutures. The gland was  atrophied and firm in texture with a small pancreatic duct (about 2m). An end-to-side choledochojejunostomy was then created over the percutaneous biliary drain using interrupted 4-0 PDS. The excluded stomach was then anastomosed to the pancreaticobiliary limb just distal to the biliary anastomosis but proximal to the ligament of Treitz. This location was chosen because the antecolic roux limb prevented the creation of an antecolic GJ without twisting or obstructing the biliary limb. A back layer of interrupted 3-0 silk sutures was placed between the stapled end of the stomach and the jejunum. The gastric staple line was then removed with cautery and an enterotomy was created on the jejunum. An inner layer of running 3-0 vicryl suture was placed in [place]. An anterior row of 3-0 silk Lembert sutures was then placed to complete the anastomosis. The biliary limb was tacked to the ligament of Treitz using 3-0 silk sutures, closing down the mesenteric defect around the PB limb. There was a mesenteric defect in the transverse mesocolon which was close with 3-0 Vicryl figure of eight sutures. Two 19-Fr round JP drains were placed and brought out through the right abdominal wall. The lateral drain was placed posterior to the pancreatic and biliary anastomoses, and the medial drain was placed anterior to the pancreatic and biliary anastomosis but posterior to the GBrandtanastomosis. Both drains were secured to the skin with 2-0 Nylon suture. A falciform ligament flap was mobilized and laid over the GDA stump. The retractors were removed. Because the patient had a significant periumbilical hernia, subcutaneous flaps were raised over the fascia bilaterally to mobilize the fascia and allow primary closure. The fascia was then closed with interrupted 1 Novafil figure-of-eight sutures. Scarpa's fascia was closed with running 3-0 vicryl suture, and the skin was closed with a running subcuticular 4-0 monocryl suture.  Dermabond was applied.  The patient tolerated the procedure with no apparent complications. All counts were correct x2 at the end of the procedure. The patient was extubated and taken to PACU in stable condition.  SMichaelle Birks MD 11/05/20 4:08 PM

## 2020-11-05 NOTE — Anesthesia Procedure Notes (Addendum)
Epidural Patient location during procedure: pre-op Start time: 11/05/2020 7:15 AM End time: 11/05/2020 7:30 AM  Staffing Anesthesiologist: Freddrick March, MD Performed: anesthesiologist   Preanesthetic Checklist Completed: patient identified, IV checked, risks and benefits discussed, monitors and equipment checked, pre-op evaluation and timeout performed  Epidural Patient position: sitting Prep: DuraPrep and site prepped and draped Patient monitoring: continuous pulse ox, blood pressure, heart rate and cardiac monitor Approach: midline Location: thoracic (1-12) (T7-8) Injection technique: LOR air  Needle:  Needle type: Tuohy  Needle gauge: 17 G Needle length: 9 cm Needle insertion depth: 6 cm Catheter type: closed end flexible Catheter size: 19 Gauge Catheter at skin depth: 12 cm Test dose: negative  Assessment Sensory level: T8 Events: blood not aspirated, injection not painful, no injection resistance, no paresthesia and negative IV test  Additional Notes Patient identified. Risks/Benefits/Options discussed with patient including but not limited to bleeding, infection, nerve damage, paralysis, failed block, incomplete pain control, headache, blood pressure changes, nausea, vomiting, reactions to medication both or allergic, itching and postpartum back pain. Confirmed with bedside nurse the patient's most recent platelet count. Confirmed with patient that they are not currently taking any anticoagulation, have any bleeding history or any family history of bleeding disorders. Patient expressed understanding and wished to proceed. All questions were answered. Sterile technique was used throughout the entire procedure. Please see nursing notes for vital signs. Test dose was given through epidural catheter and negative prior to continuing to dose epidural or start infusion. Warning signs of high block given to the patient including shortness of breath, tingling/numbness in hands,  complete motor block, or any concerning symptoms with instructions to call for help. Patient was given instructions on fall risk and not to get out of bed. All questions and concerns addressed with instructions to call with any issues or inadequate analgesia.  Reason for block:procedure for pain

## 2020-11-05 NOTE — Progress Notes (Signed)
BP continues to decline. Notified MD, orders to give 500 ml bolus.

## 2020-11-05 NOTE — Anesthesia Procedure Notes (Addendum)
Procedure Name: Intubation Date/Time: 11/05/2020 7:47 AM Performed by: Donnelly Angelica, RN Pre-anesthesia Checklist: Patient identified, Emergency Drugs available, Suction available and Patient being monitored Patient Re-evaluated:Patient Re-evaluated prior to induction Oxygen Delivery Method: Circle System Utilized Preoxygenation: Pre-oxygenation with 100% oxygen Induction Type: IV induction Ventilation: Mask ventilation without difficulty Grade View: Grade I Tube type: Oral Tube size: 7.0 mm Number of attempts: 1 Airway Equipment and Method: Stylet and Oral airway Placement Confirmation: ETT inserted through vocal cords under direct vision, positive ETCO2 and breath sounds checked- equal and bilateral Secured at: 22 cm Tube secured with: Tape Dental Injury: Teeth and Oropharynx as per pre-operative assessment  Comments: Preformed by S. Lovena Le, [place]

## 2020-11-05 NOTE — H&P (Signed)
Brittany [name] is an 55 y.o. female.    HPI: Brittany [name] is a 55 yo female who presented for evaluation of a biliary stricture. For the last 6 months, she has had progressive fatigue, 20-lb weight loss, and general malaise. She then developed light colored stools, dark urine and visible jaundice and went to her PCP, where she was found to have elevated LFTs (bilirubin of 6.5). A RUQ US showed biliary duct dilation. She underwent an MRCP which showed biliary dilation with an apparent distal common bile duct stricture. She has previously had a RNY gastric bypass (in 2014) and so an ERCP was not possible. A PTC with internal/external biliary drain was done on 5/28, after which her LFTs downtrending. Dr. Jacobs attempted an EUS via her roux limb on 6/3, but the pancreas and bile duct could not be visualized. She then underwent a percutaneous cholangiogram in IR with brush biopsies of the biliary stricture on 6/9. This showed atypical cells but was not diagnostic for malignancy. She saw me in clinic on 6/17, at which time we discussed proceeding with a Whipple as this lesion is likely to be a malignancy, vs referral to a tertiary center to attempt another EUS via push enteroscopy or EDGE. She obtained a second opinion from Dr. Howerton at [facility] and has decided to proceed with surgery. Her PTC remains in place and is capped. Alk phos remains mildly elevated on her preop labs but bilirubin is normal.  Past Medical History:  Diagnosis Date   Allergy    Angio-edema    Anxiety    Arthritis    hips, knees and hands    Blood transfusion without reported diagnosis    Depression    GERD (gastroesophageal reflux disease)    hx of    H/O hiatal hernia    History of chicken pox    Hypercholesterolemia    Hypertension    Migraine    Migraines    Morbid obesity (HCC)    Positive TB test    PTSD (post-traumatic stress disorder)    Sleep apnea    no CPAP machine    Ulcer, stomach peptic    Urticaria      Past Surgical History:  Procedure Laterality Date   ABDOMINAL HYSTERECTOMY  1997 & 2006   BREATH TEK H PYLORI  05/03/2012   Procedure: BREATH TEK H PYLORI;  Surgeon: David H Newman, MD;  Location: [facility];  Service: General;  Laterality: N/A;   COLONOSCOPY     hx of benign polyps    ESOPHAGOGASTRODUODENOSCOPY N/A 09/29/2017   Procedure: ESOPHAGOGASTRODUODENOSCOPY (EGD);  Surgeon: Stark, Malcolm T, MD;  Location: [facility];  Service: Endoscopy;  Laterality: N/A;   ESOPHAGOGASTRODUODENOSCOPY N/A 09/14/2020   Procedure: ESOPHAGOGASTRODUODENOSCOPY (EGD);  Surgeon: Jacobs, Daniel P, MD;  Location: [facility];  Service: Endoscopy;  Laterality: N/A;   EUS N/A 09/14/2020   Procedure: UPPER ENDOSCOPIC ULTRASOUND (EUS) RADIAL;  Surgeon: Jacobs, Daniel P, MD;  Location: [facility];  Service: Endoscopy;  Laterality: N/A;   EYE SURGERY     fixed a hole in the left eye   FOOT SURGERY Left    GASTRIC BYPASS     GASTRIC ROUX-EN-Y N/A 07/05/2012   Procedure: LAPAROSCOPIC ROUX-EN-Y GASTRIC;  Surgeon: David H Newman, MD;  Location: [facility];  Service: General;  Laterality: N/A;   IR BILIARY DRAIN PLACEMENT WITH CHOLANGIOGRAM  09/08/2020   IR ENDOLUMINAL BX OF BILIARY TREE  09/20/2020   IR EXCHANGE BILIARY DRAIN    09/20/2020   IRRIGATION AND DEBRIDEMENT ABSCESS N/A 01/01/2014   Procedure: IRRIGATION AND DEBRIDEMENT ABSCESS;  Surgeon: [name], MD;  Location: [facility];  Service: General;  Laterality: N/A;   LAPAROSCOPIC LYSIS OF ADHESIONS N/A 07/05/2012   Procedure: LAPAROSCOPIC LYSIS OF ADHESIONS;  Surgeon: [name], MD;  Location: [facility];  Service: General;  Laterality: N/A;  repair of abdominal wall hernia   right tube and ovary removed      UPPER GI ENDOSCOPY N/A 07/05/2012   Procedure: UPPER GI ENDOSCOPY;  Surgeon: [name], MD;  Location: [facility];  Service: General;  Laterality: N/A;    Family History  Problem Relation Age of Onset   Colon cancer Mother 76    Depression Father    Diabetes Father    Heart disease Father    Bladder Cancer Father    Depression Sister    Post-traumatic stress disorder Sister    Anxiety disorder Sister    Eczema Sister    Melanoma Maternal Grandmother    Colon cancer Maternal Grandmother        does not know age of onset   Healthy Maternal Grandfather    Healthy Paternal Grandmother    Heart disease Paternal Grandfather    Stroke Paternal Grandfather    Diabetes Paternal Grandfather    Breast cancer Maternal Aunt    Alcohol abuse Paternal Uncle    Bipolar disorder Cousin    Social History:  reports that she quit smoking about 16 years ago. Her smoking use included cigarettes. She has a 0.10 pack-year smoking history. She has never used smokeless tobacco. She reports previous alcohol use. She reports that she does not use drugs.  Allergies:  Allergies  Allergen Reactions   Contrast Media [Iodinated Diagnostic Agents] Hives   Amoxicillin Hives    .Marland KitchenHas patient had a PCN reaction causing immediate rash, facial/tongue/throat swelling, SOB or lightheadedness with hypotension: Yes Has patient had a PCN reaction causing severe rash involving mucus membranes or skin necrosis: No Has patient had a PCN reaction that required hospitalization No Has patient had a PCN reaction occurring within the last 10 years: No If all of the above answers are "NO", then may proceed with Cephalosporin use.    Bactrim [Sulfamethoxazole-Trimethoprim] Hives and Rash   Lamictal [Lamotrigine] Hives and Rash    Facility-Administered Medications Prior to Admission  Medication Dose Route Frequency Provider Last Rate Last Admin   omalizumab Arvid Right) injection 300 mg  300 mg Subcutaneous Q28 days Garnet Sierras, DO   300 mg at 10/23/20 1846   Medications Prior to Admission  Medication Sig Dispense Refill   acetaminophen (TYLENOL) 500 MG tablet Take 500 mg by mouth 2 (two) times daily as needed for moderate pain or headache.      calcium-vitamin D (OSCAL WITH D) 500-200 MG-UNIT tablet Take 1 tablet by mouth every evening. For bone health     cetirizine (ZYRTEC) 10 MG tablet Take 10 mg by mouth 2 (two) times daily.     cholecalciferol (VITAMIN D) 1000 units tablet Take 1 tablet (1,000 Units total) by mouth daily. For bone health     DULoxetine (CYMBALTA) 60 MG capsule Take 1 capsule (60 mg total) by mouth 2 (two) times daily. 180 capsule 0   famotidine (PEPCID) 20 MG tablet Take 1 tablet (20 mg total) by mouth daily.     LORazepam (ATIVAN) 0.5 MG tablet Take 1 tablet (0.5 mg total) by mouth daily as needed for anxiety. 15  tablet 0   metoprolol succinate (TOPROL XL) 25 MG 24 hr tablet Take 1 tablet (25 mg total) by mouth daily. (Patient taking differently: Take 25 mg by mouth daily in the afternoon.) 90 tablet 3   montelukast (SINGULAIR) 10 MG tablet TAKE 1 TABLET(10 MG) BY MOUTH AT BEDTIME (Patient taking differently: Take 10 mg by mouth at bedtime.) 90 tablet 0   Multiple Vitamin (MULTI-VITAMIN) tablet Take 1 tablet by mouth daily.     QUEtiapine (SEROQUEL) 50 MG tablet TAKE 3 TABLETS(150 MG) BY MOUTH AT BEDTIME (Patient taking differently: Take 150 mg by mouth at bedtime.) 90 tablet 2   vitamin B-12 (CYANOCOBALAMIN) 500 MCG tablet Take 1 tablet (500 mcg total) by mouth daily. For Vitamin B-12 replacement (Patient taking differently: Take 500 mcg by mouth daily.)     vitamin E 1000 UNIT capsule Take 1,000 Units by mouth daily.     XOLAIR 150 MG injection INJECT 300 MG UNDER THE SKIN EVERY 14 DAYS (Patient taking differently: Inject 300 mg into the skin every 28 (twenty-eight) days.) 4 each 11   EPINEPHrine (EPIPEN 2-PAK) 0.3 mg/0.3 mL IJ SOAJ injection Inject 0.3 mg into the muscle as needed for anaphylaxis. 0.3 mL 1    Results for orders placed or performed during the hospital encounter of 11/05/20 (from the past 48 hour(s))  Prepare RBC (crossmatch)     Status: None   Collection Time: 11/05/20  5:48 AM  Result Value Ref  Range   Order Confirmation      ORDER PROCESSED BY BLOOD BANK Performed at [facility], [address], [place], [place] [address]    No results found.  Review of Systems  Blood pressure 112/71, pulse 61, temperature 98.4 F (36.9 C), temperature source Oral, resp. rate 17, height 5' 9" (1.753 m), weight 107.7 kg, SpO2 97 %. Physical Exam Constitutional:      General: She is not in acute distress.    Appearance: Normal appearance.  HENT:     Head: Normocephalic and atraumatic.  Eyes:     General: No scleral icterus. Pulmonary:     Effort: Pulmonary effort is normal. No respiratory distress.  Abdominal:     General: There is no distension.     Palpations: Abdomen is soft.     Tenderness: There is no abdominal tenderness.     Comments: Well-healed surgical scars. Umbilical hernia.  Musculoskeletal:        General: Normal range of motion.     Cervical back: Normal range of motion.  Skin:    General: Skin is warm and dry.     Coloration: Skin is not jaundiced.  Neurological:     General: No focal deficit present.     Mental Status: She is alert and oriented to person, place, and time.  Psychiatric:        Mood and Affect: Mood normal.        Behavior: Behavior normal.        Thought Content: Thought content normal.     Assessment/Plan 55 yo female with distal CBD stricture, suspicious for malignancy. Proceed to OR for Whipple procedure. Informed consent obtained, type and cross completed. Admit to inpatient postoperatively.  Dwan Bolt, MD 11/05/2020, 6:55 AM

## 2020-11-05 NOTE — Transfer of Care (Signed)
Immediate Anesthesia Transfer of Care Note  Patient: Brittany [name]  Procedure(s) Performed: WHIPPLE PROCEDURE (Abdomen)  Patient Location: PACU  Anesthesia Type:General  Level of Consciousness: drowsy, patient cooperative and responds to stimulation  Airway & Oxygen Therapy: Patient Spontanous Breathing and Patient connected to face mask oxygen  Post-op Assessment: Report given to RN and Post -op Vital signs reviewed and stable  Post vital signs: Reviewed and stable  Last Vitals:  Vitals Value Taken Time  BP 79/24 11/05/20 1400  Temp 36.6 C 11/05/20 1400  Pulse 57 11/05/20 1406  Resp 12 11/05/20 1406  SpO2 100 % 11/05/20 1406  Vitals shown include unvalidated device data.  Last Pain:  Vitals:   11/05/20 0604  TempSrc:   PainSc: 0-No pain         Complications: No notable events documented.

## 2020-11-06 ENCOUNTER — Encounter (HOSPITAL_COMMUNITY): Payer: Self-pay | Admitting: Surgery

## 2020-11-06 ENCOUNTER — Ambulatory Visit: Payer: 59 | Admitting: Psychology

## 2020-11-06 LAB — CBC
HCT: 30.2 % — ABNORMAL LOW (ref 36.0–46.0)
Hemoglobin: 9.9 g/dL — ABNORMAL LOW (ref 12.0–15.0)
MCH: 32.2 pg (ref 26.0–34.0)
MCHC: 32.8 g/dL (ref 30.0–36.0)
MCV: 98.4 fL (ref 80.0–100.0)
Platelets: 201 10*3/uL (ref 150–400)
RBC: 3.07 MIL/uL — ABNORMAL LOW (ref 3.87–5.11)
RDW: 14.1 % (ref 11.5–15.5)
WBC: 10.1 10*3/uL (ref 4.0–10.5)
nRBC: 0 % (ref 0.0–0.2)

## 2020-11-06 LAB — COMPREHENSIVE METABOLIC PANEL
ALT: 57 U/L — ABNORMAL HIGH (ref 0–44)
AST: 52 U/L — ABNORMAL HIGH (ref 15–41)
Albumin: 2 g/dL — ABNORMAL LOW (ref 3.5–5.0)
Alkaline Phosphatase: 109 U/L (ref 38–126)
Anion gap: 7 (ref 5–15)
BUN: 6 mg/dL (ref 6–20)
CO2: 22 mmol/L (ref 22–32)
Calcium: 7.7 mg/dL — ABNORMAL LOW (ref 8.9–10.3)
Chloride: 110 mmol/L (ref 98–111)
Creatinine, Ser: 0.61 mg/dL (ref 0.44–1.00)
GFR, Estimated: >60 mL/min (ref >60)
Glucose, Bld: 119 mg/dL — ABNORMAL HIGH (ref 70–99)
Potassium: 3.8 mmol/L (ref 3.5–5.1)
Sodium: 139 mmol/L (ref 135–145)
Total Bilirubin: 0.6 mg/dL (ref 0.3–1.2)
Total Protein: 4.1 g/dL — ABNORMAL LOW (ref 6.5–8.1)

## 2020-11-06 LAB — GLUCOSE, CAPILLARY
Glucose-Capillary: 99 mg/dL (ref 70–99)
Glucose-Capillary: 86 mg/dL (ref 70–99)
Glucose-Capillary: 97 mg/dL (ref 70–99)
Glucose-Capillary: 94 mg/dL (ref 70–99)
Glucose-Capillary: 131 mg/dL — ABNORMAL HIGH (ref 70–99)
Glucose-Capillary: 116 mg/dL — ABNORMAL HIGH (ref 70–99)

## 2020-11-06 LAB — MAGNESIUM: Magnesium: 1.4 mg/dL — ABNORMAL LOW (ref 1.7–2.4)

## 2020-11-06 MED ORDER — METOPROLOL TARTRATE 5 MG/5ML IV SOLN
2.5000 mg | Freq: Three times a day (TID) | INTRAVENOUS | Status: DC
  Administered 2020-11-06 – 2020-11-09 (×7): 2.5 mg via INTRAVENOUS
  Filled 2020-11-06 (×7): qty 5

## 2020-11-06 MED ORDER — DEXTROSE-NACL 5-0.45 % IV SOLN: INTRAVENOUS | Status: DC

## 2020-11-06 MED ORDER — MAGNESIUM SULFATE 2 GM/50ML IV SOLN
2.0000 g | Freq: Once | INTRAVENOUS | Status: AC
  Administered 2020-11-06: 2 g via INTRAVENOUS
  Filled 2020-11-06: qty 50

## 2020-11-06 NOTE — Progress Notes (Signed)
    1 Day Post-Op  Subjective: Had low BP overnight with drop in UOP, responded to 500 cc fluid bolus. UOP has been 30-40 ml/hr since then. Pain is well controlled. No nausea or vomiting. Patient has no acute complaints this morning. Labs unremarkable.   Objective: Vital signs in last 24 hours: Temp:  [97.5 F (36.4 C)-97.9 F (36.6 C)] 97.6 F (36.4 C) (07/26 0400) Pulse Rate:  [49-88] 75 (07/26 0500) Resp:  [8-24] 16 (07/26 0600) BP: (77-107)/(24-64) 102/60 (07/26 0600) SpO2:  [91 %-100 %] 97 % (07/26 0529) Arterial Line BP: (70-120)/(35-72) 70/61 (07/25 2300) FiO2 (%):  [21 %] 21 % (07/26 0529) Last BM Date:  (pta)  Intake/Output from previous day: 07/25 0701 - 07/26 0700 In: 4916.2 [I.V.:3804.8; IV Piggyback:963.6] Out: 1945 [Urine:930; Drains:665; Blood:350] Intake/Output this shift: Total I/O In: 1572.8 [I.V.:1137.3; Other:72; IV Piggyback:363.6] Out: 44 [Urine:440; Drains:365]  PE: General: resting comfortably, NAD Neuro: alert and oriented, no focal deficits Resp: normal work of breathing CV: RRR Abdomen: soft, nondistended, nontender to palpation. Midline incision c/d/I with no erythema or induration. JP x2 with serosanguinous drainage. RUQ biliary drain with bile. Extremities: warm and well-perfused GU: foley draining clear yellow urine   Lab Results:  Recent Labs    11/05/20 1400 11/06/20 0138  WBC 9.3 10.1  HGB 9.9* 9.9*  HCT 29.3* 30.2*  PLT 216 201   BMET Recent Labs    11/05/20 1400 11/06/20 0138  NA 139 139  K 3.2* 3.8  CL 107 110  CO2 21* 22  GLUCOSE 143* 119*  BUN 5* 6  CREATININE 0.53 0.61  CALCIUM 7.5* 7.7*   PT/INR No results for input(s): LABPROT, INR in the last 72 hours. CMP     Component Value Date/Time   NA 139 11/06/2020 0138   NA 140 05/24/2018 1030   K 3.8 11/06/2020 0138   CL 110 11/06/2020 0138   CO2 22 11/06/2020 0138   GLUCOSE 119 (H) 11/06/2020 0138   BUN 6 11/06/2020 0138   BUN 17 05/24/2018 1030    CREATININE 0.61 11/06/2020 0138   CALCIUM 7.7 (L) 11/06/2020 0138   PROT 4.1 (L) 11/06/2020 0138   PROT 6.9 05/24/2018 1030   ALBUMIN 2.0 (L) 11/06/2020 0138   ALBUMIN 4.3 05/24/2018 1030   AST 52 (H) 11/06/2020 0138   ALT 57 (H) 11/06/2020 0138   ALKPHOS 109 11/06/2020 0138   BILITOT 0.6 11/06/2020 0138   BILITOT 0.4 05/24/2018 1030   GFRNONAA >60 11/06/2020 0138   GFRAA 115 05/24/2018 1030   Lipase     Component Value Date/Time   LIPASE 17 09/12/2020 1510       Studies/Results: No results found.      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 - Advance to clear liquid diet - Maintenance IV fluids at 75 ml/hr - Pain control: dilaudid PCA, epidural, scheduled tylenol - Remove foley - Keep biliary drain to gravity today, plan to cap tomorrow - Drain amylase pending - Home medications as appropriate - VTE: SQH, SCDs - Dispo: ICU, possible transfer out this afternoon if blood pressure remains stable    LOS: 1 day    Michaelle Birks, MD [facility] Surgery General, Hepatobiliary and Pancreatic Surgery 11/06/20 6:52 AM

## 2020-11-06 NOTE — Anesthesia Post-op Follow-up Note (Signed)
  Anesthesia Pain Follow-up Note  Patient: Brittany [name]  Day #: 2  Date of Follow-up: 11/06/2020 Time: 3:17 PM  Last Vitals:  Vitals:   11/06/20 1400 11/06/20 1440  BP: 125/68   Pulse:    Resp: (!) 22 15  Temp:    SpO2:  99%    Level of Consciousness: alert  Pain: 2 /10   Side Effects:None  Catheter Site Exam:clean, dry, no drainage  Anti-Coag Meds (From admission, onward)   Start     Dose/Rate Route Frequency Ordered Stop   11/06/20 0600  heparin injection 5,000 Units        5,000 Units Subcutaneous Every 8 hours 11/05/20 1359      Epidural / Intrathecal (From admission, onward)   Start     Dose/Rate Route Frequency Ordered Stop   11/05/20 1545  ropivacaine (PF) 2 mg/mL (0.2%) (NAROPIN) injection        6 mL/hr 6 mL/hr  Epidural Continuous 11/05/20 1531         Plan: Continue current therapy of postop epidural at surgeon's request  Merlinda Frederick   Patient doing well on no pressors. Pain well-controlled at rest and with movement. Norton Blizzard, MD

## 2020-11-06 NOTE — Anesthesia Postprocedure Evaluation (Signed)
Anesthesia Post Note  Patient: Brittany [name]  Procedure(s) Performed: WHIPPLE PROCEDURE (Abdomen)     Patient location during evaluation: PACU Anesthesia Type: General and Epidural Level of consciousness: awake and alert Pain management: pain level controlled Vital Signs Assessment: post-procedure vital signs reviewed and stable Respiratory status: spontaneous breathing, nonlabored ventilation, respiratory function stable and patient connected to nasal cannula oxygen Cardiovascular status: blood pressure returned to baseline and stable Postop Assessment: no apparent nausea or vomiting Anesthetic complications: no   No notable events documented.  Last Vitals:  Vitals:   11/06/20 1440 11/06/20 1500  BP:  125/81  Pulse:  89  Resp: 15 14  Temp:    SpO2: 99% 96%    Last Pain:  Vitals:   11/06/20 1440  TempSrc:   PainSc: Asleep   Pain Goal: Patients Stated Pain Goal: 2 (11/06/20 0800)                 Ladonte Verstraete L Paetyn Pietrzak

## 2020-11-06 NOTE — Evaluation (Signed)
Physical Therapy Evaluation Patient Details Name: Brittany [name] MRN: [id number] DOB: May 10, 1965 Today's Date: 11/06/2020   History of Present Illness  Pt is a 55 y.o. F who presented for evaluation of distal CBD stricture, suspicious for malignancy. 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 Significant PMH: HTN, gastric bypass.  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 Pt reports she will have assist from her daughter upon discharge. Pt is very pleasant and motivated to participate. Requiring min-mod assist for bed mobility. Pt tolerated ~10 minutes on edge of bed and was able to participate in therapeutic exercises. Transfer to chair deferred in setting of hypotension and dizziness (see vitals below). Suspect good progress given BP and pain control. Will continue to promote mobility as tolerated.   Vitals: Supine: 112/61 (76) Sitting: 77/54 (62) Sitting: ~5 minutes: 85/64 (72) Return to supine: 78/52 (62) Supine ~5 minutes: 88/71 (77)    Follow Up Recommendations Home health PT;Supervision/Assistance - 24 hour    Equipment Recommendations  3in1 (PT)    Recommendations for Other Services OT consult     Precautions / Restrictions Precautions Precautions: Fall;Other (comment) Precaution Comments: Watch BP, epidural, PCA, x2 JP bulbs, biliary drain Restrictions Weight Bearing Restrictions: No      Mobility  Bed Mobility Overal bed mobility: Needs Assistance Bed Mobility: Rolling;Sidelying to Sit;Sit to Sidelying Rolling: Min guard Sidelying to sit: Min assist     Sit to sidelying: Mod assist General bed mobility comments: Cues for log roll technique, pt rolling towards left, minA for trunk to upright. ModA for BLE management back into bed    Transfers                 General transfer comment: deferred due to hypotension  Ambulation/Gait                Stairs            Wheelchair  Mobility    Modified Rankin (Stroke Patients Only)       Balance Overall balance assessment: Needs assistance Sitting-balance support: Feet supported Sitting balance-Leahy Scale: Good                                       Pertinent Vitals/Pain Pain Assessment: 0-10 Pain Score: 3  Pain Location: surgical site Pain Descriptors / Indicators: Operative site guarding Pain Intervention(s): Limited activity within patient's tolerance;Monitored during session    Home Living Family/patient expects to be discharged to:: Private residence Living Arrangements: Alone Available Help at Discharge: Family (daughter) Type of Home: House Home Access: Level entry     Home Layout: Two level Home Equipment: Environmental consultant - 2 wheels Additional Comments: has moved bed to main level, has 1/2 bath on 1st floor    Prior Function Level of Independence: Independent         Comments: Office job, works for Education officer, environmental        Extremity/Trunk Assessment   Upper Extremity Assessment Upper Extremity Assessment: Overall WFL for tasks assessed    Lower Extremity Assessment Lower Extremity Assessment: Overall WFL for tasks assessed       Communication   Communication: No difficulties  Cognition Arousal/Alertness: Awake/alert Behavior During Therapy: WFL for tasks assessed/performed Overall Cognitive Status: Within Functional Limits for tasks assessed  General Comments      Exercises General Exercises - Lower Extremity Ankle Circles/Pumps: Both;20 reps;Seated Long Arc Quad: Both;10 reps;Seated   Assessment/Plan    PT Assessment Patient needs continued PT services  PT Problem List Decreased strength;Decreased balance;Decreased activity tolerance;Decreased mobility;Pain       PT Treatment Interventions DME instruction;Stair training;Gait training;Functional mobility training;Therapeutic  exercise;Therapeutic activities;Balance training;Patient/family education    PT Goals (Current goals can be found in the Care Plan section)  Acute Rehab PT Goals Patient Stated Goal: get out of bed PT Goal Formulation: With patient Time For Goal Achievement: 11/20/20 Potential to Achieve Goals: Good    Frequency Min 3X/week   Barriers to discharge        Co-evaluation               AM-PAC PT "6 Clicks" Mobility  Outcome Measure Help needed turning from your back to your side while in a flat bed without using bedrails?: A Little Help needed moving from lying on your back to sitting on the side of a flat bed without using bedrails?: A Little Help needed moving to and from a bed to a chair (including a wheelchair)?: A Little Help needed standing up from a chair using your arms (e.g., wheelchair or bedside chair)?: A Little Help needed to walk in hospital room?: A Lot Help needed climbing 3-5 steps with a railing? : A Lot 6 Click Score: 16    End of Session   Activity Tolerance: Other (comment) (limited by hypotension) Patient left: in bed;with call bell/phone within reach Nurse Communication: Mobility status;Other (comment) (vitals) PT Visit Diagnosis: Pain;Difficulty in walking, not elsewhere classified (R26.2) Pain - part of body:  (abdomen)    Time: [id number] PT Time Calculation (min) (ACUTE ONLY): 27 min   Charges:   PT Evaluation $PT Eval Moderate Complexity: 1 Mod PT Treatments $Therapeutic Activity: 8-22 mins        Wyona Almas, PT, DPT Acute Rehabilitation Services Pager [phone] Office [phone]   Deno Etienne 11/06/2020, 12:36 PM

## 2020-11-07 LAB — CBC
HCT: 29.1 % — ABNORMAL LOW (ref 36.0–46.0)
Hemoglobin: 9.4 g/dL — ABNORMAL LOW (ref 12.0–15.0)
MCH: 32.3 pg (ref 26.0–34.0)
MCHC: 32.3 g/dL (ref 30.0–36.0)
MCV: 100 fL (ref 80.0–100.0)
Platelets: 212 10*3/uL (ref 150–400)
RBC: 2.91 MIL/uL — ABNORMAL LOW (ref 3.87–5.11)
RDW: 14.7 % (ref 11.5–15.5)
WBC: 8.4 10*3/uL (ref 4.0–10.5)
nRBC: 0 % (ref 0.0–0.2)

## 2020-11-07 LAB — COMPREHENSIVE METABOLIC PANEL
ALT: 42 U/L (ref 0–44)
AST: 23 U/L (ref 15–41)
Albumin: 1.8 g/dL — ABNORMAL LOW (ref 3.5–5.0)
Alkaline Phosphatase: 111 U/L (ref 38–126)
Anion gap: 7 (ref 5–15)
BUN: 11 mg/dL (ref 6–20)
CO2: 21 mmol/L — ABNORMAL LOW (ref 22–32)
Calcium: 7.8 mg/dL — ABNORMAL LOW (ref 8.9–10.3)
Chloride: 106 mmol/L (ref 98–111)
Creatinine, Ser: 0.57 mg/dL (ref 0.44–1.00)
GFR, Estimated: >60 mL/min (ref >60)
Glucose, Bld: 124 mg/dL — ABNORMAL HIGH (ref 70–99)
Potassium: 3.7 mmol/L (ref 3.5–5.1)
Sodium: 134 mmol/L — ABNORMAL LOW (ref 135–145)
Total Bilirubin: <0.1 mg/dL — ABNORMAL LOW (ref 0.3–1.2)
Total Protein: 4.1 g/dL — ABNORMAL LOW (ref 6.5–8.1)

## 2020-11-07 LAB — AMYLASE, BODY FLUID (OTHER)
Amylase, Body Fluid: 4 U/L
Amylase, Body Fluid: 7 U/L

## 2020-11-07 LAB — GLUCOSE, CAPILLARY
Glucose-Capillary: 104 mg/dL — ABNORMAL HIGH (ref 70–99)
Glucose-Capillary: 110 mg/dL — ABNORMAL HIGH (ref 70–99)
Glucose-Capillary: 114 mg/dL — ABNORMAL HIGH (ref 70–99)
Glucose-Capillary: 128 mg/dL — ABNORMAL HIGH (ref 70–99)
Glucose-Capillary: 137 mg/dL — ABNORMAL HIGH (ref 70–99)
Glucose-Capillary: 86 mg/dL (ref 70–99)

## 2020-11-07 LAB — HEMOGLOBIN A1C
Hgb A1c MFr Bld: 5.4 % (ref 4.8–5.6)
Mean Plasma Glucose: 108 mg/dL

## 2020-11-07 MED ORDER — ENSURE ENLIVE PO LIQD
237.0000 mL | Freq: Two times a day (BID) | ORAL | Status: DC
  Administered 2020-11-07 – 2020-11-08 (×2): 237 mL via ORAL

## 2020-11-07 MED ORDER — OXYCODONE HCL 5 MG PO TABS
5.0000 mg | ORAL_TABLET | ORAL | Status: DC | PRN
  Administered 2020-11-08 – 2020-11-10 (×4): 5 mg via ORAL
  Filled 2020-11-07 (×4): qty 1

## 2020-11-07 MED ORDER — PANTOPRAZOLE SODIUM 40 MG PO TBEC
40.0000 mg | DELAYED_RELEASE_TABLET | Freq: Every day | ORAL | Status: DC
  Administered 2020-11-07 – 2020-11-10 (×4): 40 mg via ORAL
  Filled 2020-11-07 (×4): qty 1

## 2020-11-07 MED ORDER — HYDROMORPHONE HCL 1 MG/ML IJ SOLN: 0.5000 mg | INTRAMUSCULAR | Status: DC | PRN

## 2020-11-07 NOTE — Addendum Note (Signed)
Addendum  created 11/07/20 1544 by Merlinda Frederick, MD   Clinical Note Signed

## 2020-11-07 NOTE — Progress Notes (Signed)
    2 Days Post-Op  Subjective: Orthostatic when sitting up on side of bed with PT yesterday. Able to sit up this morning with no symptoms. Required I/O cath x1 overnight for urinary retention. Tolerating clear liquids, no nausea or vomiting. Pain very well-controlled, minimal use of PCA. Labs unremarkable.   Objective: Vital signs in last 24 hours: Temp:  [97.6 F (36.4 C)-98.8 F (37.1 C)] 97.6 F (36.4 C) (07/27 0400) Pulse Rate:  [75-107] 82 (07/27 0700) Resp:  [8-29] 10 (07/27 0700) BP: (88-138)/(55-97) 112/60 (07/27 0700) SpO2:  [89 %-100 %] 100 % (07/27 0700) FiO2 (%):  [21 %] 21 % (07/27 0400) Last BM Date: 11/05/20  Intake/Output from previous day: 07/26 0701 - 07/27 0700 In: 1572.6 [P.O.:240; I.V.:1144.6; IV Piggyback:50] Out: [id number] [Urine:1025; Drains:530] Intake/Output this shift: No intake/output data recorded.  PE: General: resting comfortably, NAD Neuro: alert and oriented, no focal deficits Resp: normal work of breathing CV: RRR Abdomen: soft, nondistended, nontender to palpation. Midline incision c/d/I with no erythema or induration. JP x2 with serosanguinous drainage. RUQ biliary drain with bile. Extremities: warm and well-perfused   Lab Results:  Recent Labs    11/06/20 0138 11/07/20 0255  WBC 10.1 8.4  HGB 9.9* 9.4*  HCT 30.2* 29.1*  PLT 201 212   BMET Recent Labs    11/06/20 0138 11/07/20 0255  NA 139 134*  K 3.8 3.7  CL 110 106  CO2 22 21*  GLUCOSE 119* 124*  BUN 6 11  CREATININE 0.61 0.57  CALCIUM 7.7* 7.8*   PT/INR No results for input(s): LABPROT, INR in the last 72 hours. CMP     Component Value Date/Time   NA 134 (L) 11/07/2020 0255   NA 140 05/24/2018 1030   K 3.7 11/07/2020 0255   CL 106 11/07/2020 0255   CO2 21 (L) 11/07/2020 0255   GLUCOSE 124 (H) 11/07/2020 0255   BUN 11 11/07/2020 0255   BUN 17 05/24/2018 1030   CREATININE 0.57 11/07/2020 0255   CALCIUM 7.8 (L) 11/07/2020 0255   PROT 4.1 (L) 11/07/2020 0255    PROT 6.9 05/24/2018 1030   ALBUMIN 1.8 (L) 11/07/2020 0255   ALBUMIN 4.3 05/24/2018 1030   AST 23 11/07/2020 0255   ALT 42 11/07/2020 0255   ALKPHOS 111 11/07/2020 0255   BILITOT <0.1 (L) 11/07/2020 0255   BILITOT 0.4 05/24/2018 1030   GFRNONAA >60 11/07/2020 0255   GFRAA 115 05/24/2018 1030   Lipase     Component Value Date/Time   LIPASE 17 09/12/2020 1510       Studies/Results: No results found.      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 - Advance to full liquid diet - Maintenance IV fluids at 50 ml/hr - Pain control: epidural, scheduled tylenol. Discontinue PCA, transition to prn oxycodone and dilaudid. - Replace foley if patient fails to void, keep in place until epidural removed - Cap biliary drain today - Home medications as appropriate - VTE: SQH, SCDs - Dispo: ICU, cannot go to med-surg floor with epidural so will remain in ICU today.    LOS: 2 days    Michaelle Birks, MD [facility] Surgery General, Hepatobiliary and Pancreatic Surgery 11/07/20 7:49 AM

## 2020-11-07 NOTE — Evaluation (Signed)
Occupational Therapy Evaluation Patient Details Name: Brittany [name] MRN: [id number] DOB: Mar 28, 1966 Today's Date: 11/07/2020    History of Present Illness Pt is a 55 y.o. F who presented for evaluation of distal CBD stricture, suspicious for malignancy. 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 Significant PMH: HTN, gastric bypass.   Clinical Impression   Pt PTA: pt living alone and supportive family nearby to assist. Pt currently, Pt limited by pain in epidural site and incision site with decreased mobility and decreased ability to care for self. Pt set-upA to maxA for ADL tasks and minA for mobility with RW taking a few steps to recliner. Pt would greatly benefit from continued OT skilled services. OT following acutely.  BP in sitting EOB: 130/90, Pt standing and then having to sit down 95/63- given cold wash rag and started to feel nauseaous; pain subsided and sitting recliner in recliner 112/73, breifly O2 ~85% with exertion and then increased to  >90% O2 with rest break.     Follow Up Recommendations  Home health OT;Supervision/Assistance - 24 hour    Equipment Recommendations  3 in 1 bedside commode    Recommendations for Other Services       Precautions / Restrictions Precautions Precautions: Fall;Other (comment) Precaution Comments: Watch BP, epidural, x2 JP bulbs, biliary drain Restrictions Weight Bearing Restrictions: No      Mobility Bed Mobility Overal bed mobility: Needs Assistance Bed Mobility: Rolling;Sidelying to Sit Rolling: Min guard Sidelying to sit: Min guard       General bed mobility comments: Cues for log roll technique, pt rolling towards left, pillow splinting    Transfers                 General transfer comment: deferred due to hypotension    Balance Overall balance assessment: Needs assistance Sitting-balance support: Feet supported Sitting balance-Leahy Scale: Good     Standing balance support: Bilateral  upper extremity supported Standing balance-Leahy Scale: Poor Standing balance comment: BUE supported by RW                           ADL either performed or assessed with clinical judgement   ADL Overall ADL's : Needs assistance/impaired Eating/Feeding: Set up;Sitting   Grooming: Set up;Sitting   Upper Body Bathing: Minimal assistance;Sitting   Lower Body Bathing: Maximal assistance;Sitting/lateral leans;Sit to/from stand   Upper Body Dressing : Minimal assistance;Sitting   Lower Body Dressing: Maximal assistance;Sitting/lateral leans;Sit to/from stand;Cueing for safety   Toilet Transfer: Minimal assistance;Stand-pivot;RW;BSC Toilet Transfer Details (indicate cue type and reason): simulated to recliner Toileting- Clothing Manipulation and Hygiene: Maximal assistance;Sitting/lateral lean;Sit to/from stand       Functional mobility during ADLs: Minimal assistance;Rolling walker;Cueing for safety General ADL Comments: Pt limited by pain in epidural site and incision site with decreased mobility and decreased ability to care for self.     Vision Baseline Vision/History: Wears glasses Wears Glasses: At all times Patient Visual Report: No change from baseline Vision Assessment?: No apparent visual deficits     Perception     Praxis      Pertinent Vitals/Pain Pain Assessment: 0-10 Pain Score: 3  Pain Location: surgical site Pain Descriptors / Indicators: Operative site guarding Pain Intervention(s): Limited activity within patient's tolerance     Hand Dominance Right   Extremity/Trunk Assessment Upper Extremity Assessment Upper Extremity Assessment: Overall WFL for tasks assessed   Lower Extremity Assessment Lower Extremity Assessment: Generalized weakness   Cervical / Trunk  Assessment Cervical / Trunk Assessment: Normal   Communication Communication Communication: No difficulties   Cognition Arousal/Alertness: Awake/alert Behavior During Therapy:  WFL for tasks assessed/performed Overall Cognitive Status: Within Functional Limits for tasks assessed                                     General Comments  BP in sitting EOB: 130/90, Pt standing and then having to sit down 95/63- given cold wash rag and started to feel nauseaous; psubsided and sitting recliner in recliner 112/73, breifly O2 ~85% with exertion and then increased to  >90% O2 with rest break.    Exercises     Shoulder Instructions      Home Living Family/patient expects to be discharged to:: Private residence Living Arrangements: Alone Available Help at Discharge: Family (daughter) Type of Home: House Home Access: Level entry     Home Layout: Two level               Home Equipment: Walker - 2 wheels   Additional Comments: has moved bed to main level, has 1/2 bath on 1st floor      Prior Functioning/Environment Level of Independence: Independent        Comments: Office job, works for [employer]        OT Problem List: Decreased strength;Decreased activity tolerance;Impaired balance (sitting and/or standing);Decreased safety awareness;Decreased knowledge of use of DME or AE;Decreased knowledge of precautions;Pain;Increased edema;Cardiopulmonary status limiting activity      OT Treatment/Interventions: Self-care/ADL training;Therapeutic exercise;Energy conservation;DME and/or AE instruction;Splinting;Patient/family education;Balance training    OT Goals(Current goals can be found in the care plan section) Acute Rehab OT Goals Patient Stated Goal: get out of bed OT Goal Formulation: With patient Time For Goal Achievement: 11/21/20 Potential to Achieve Goals: Good ADL Goals Pt Will Perform Grooming: with min guard assist;standing Pt Will Perform Lower Body Dressing: with min assist;with adaptive equipment;sit to/from stand Pt Will Transfer to Toilet: with min guard assist;ambulating;regular height toilet Pt Will Perform  Toileting - Clothing Manipulation and hygiene: with min assist;sit to/from stand Additional ADL Goal #1: pt will tolerate x10 mins of OOB ADL tasks with minimal cues for proper body mechanics to decrease pain.  OT Frequency: Min 2X/week   Barriers to D/C:            Co-evaluation              AM-PAC OT "6 Clicks" Daily Activity     Outcome Measure Help from another person eating meals?: A Little Help from another person taking care of personal grooming?: A Little Help from another person toileting, which includes using toliet, bedpan, or urinal?: A Lot Help from another person bathing (including washing, rinsing, drying)?: A Lot Help from another person to put on and taking off regular upper body clothing?: A Lot Help from another person to put on and taking off regular lower body clothing?: A Lot 6 Click Score: 14   End of Session Equipment Utilized During Treatment: Surveyor, mining Communication: Mobility status  Activity Tolerance: Patient limited by pain Patient left: in chair;with call bell/phone within reach;with chair alarm set  OT Visit Diagnosis: Unsteadiness on feet (R26.81);Muscle weakness (generalized) (M62.81);Pain                Time: 1220-1250 OT Time Calculation (min): 30 min Charges:  OT General Charges $OT Visit: 1 Visit OT Evaluation $OT Eval Moderate  Complexity: 1 Mod OT Treatments $Self Care/Home Management : 8-22 mins  Jefferey Pica, OTR/L Acute Rehabilitation Services Pager: [phone] Office: [phone]  Ezme Duch C 11/07/2020, 4:56 PM

## 2020-11-07 NOTE — Anesthesia Post-op Follow-up Note (Signed)
  Anesthesia Pain Follow-up Note  Patient: Brittany [name]  Day #: 3  Date of Follow-up: 11/07/2020 Time: 3:42 PM  Last Vitals:  Vitals:   11/07/20 1400 11/07/20 1500  BP: 135/77 130/76  Pulse: 94 86  Resp: 15 10  Temp:    SpO2: 95% 93%    Level of Consciousness: alert asleep, but easily aroused  Pain: mild   Side Effects:None  Catheter Site Exam:clean, dry, no drainage  Anti-Coag Meds (From admission, onward)   Start     Dose/Rate Route Frequency Ordered Stop   11/06/20 0600  heparin injection 5,000 Units        5,000 Units Subcutaneous Every 8 hours 11/05/20 1359      Epidural / Intrathecal (From admission, onward)   Start     Dose/Rate Route Frequency Ordered Stop   11/05/20 1545  ropivacaine (PF) 2 mg/mL (0.2%) (NAROPIN) injection        6 mL/hr 6 mL/hr  Epidural Continuous 11/05/20 1531         Plan: Continue current therapy of postop epidural at surgeon's request Patient doing well and is off the narcotic PCA. Unable to void when foley removed so foley was re-placed. Discussed with RN that we will contact patient's surgical team to discuss removal of epidural on 7/28.   Merlinda Frederick

## 2020-11-08 LAB — GLUCOSE, CAPILLARY
Glucose-Capillary: 87 mg/dL (ref 70–99)
Glucose-Capillary: 92 mg/dL (ref 70–99)
Glucose-Capillary: 119 mg/dL — ABNORMAL HIGH (ref 70–99)
Glucose-Capillary: 91 mg/dL (ref 70–99)
Glucose-Capillary: 94 mg/dL (ref 70–99)

## 2020-11-08 LAB — COMPREHENSIVE METABOLIC PANEL
ALT: 34 U/L (ref 0–44)
AST: 16 U/L (ref 15–41)
Albumin: 1.7 g/dL — ABNORMAL LOW (ref 3.5–5.0)
Alkaline Phosphatase: 113 U/L (ref 38–126)
Anion gap: 5 (ref 5–15)
BUN: 7 mg/dL (ref 6–20)
CO2: 25 mmol/L (ref 22–32)
Calcium: 7.9 mg/dL — ABNORMAL LOW (ref 8.9–10.3)
Chloride: 106 mmol/L (ref 98–111)
Creatinine, Ser: 0.42 mg/dL — ABNORMAL LOW (ref 0.44–1.00)
GFR, Estimated: >60 mL/min (ref >60)
Glucose, Bld: 95 mg/dL (ref 70–99)
Potassium: 3.2 mmol/L — ABNORMAL LOW (ref 3.5–5.1)
Sodium: 136 mmol/L (ref 135–145)
Total Bilirubin: 0.4 mg/dL (ref 0.3–1.2)
Total Protein: 4.2 g/dL — ABNORMAL LOW (ref 6.5–8.1)

## 2020-11-08 LAB — CBC
HCT: 27 % — ABNORMAL LOW (ref 36.0–46.0)
Hemoglobin: 8.9 g/dL — ABNORMAL LOW (ref 12.0–15.0)
MCH: 31.9 pg (ref 26.0–34.0)
MCHC: 33 g/dL (ref 30.0–36.0)
MCV: 96.8 fL (ref 80.0–100.0)
Platelets: 210 10*3/uL (ref 150–400)
RBC: 2.79 MIL/uL — ABNORMAL LOW (ref 3.87–5.11)
RDW: 14.3 % (ref 11.5–15.5)
WBC: 5.8 10*3/uL (ref 4.0–10.5)
nRBC: 0 % (ref 0.0–0.2)

## 2020-11-08 LAB — AMYLASE, PLEURAL OR PERITONEAL FLUID
Amylase, Fluid: 11 U/L
Amylase, Fluid: 8 U/L

## 2020-11-08 MED ORDER — DOCUSATE SODIUM 100 MG PO CAPS
100.0000 mg | ORAL_CAPSULE | Freq: Two times a day (BID) | ORAL | Status: DC
  Administered 2020-11-08 – 2020-11-10 (×4): 100 mg via ORAL
  Filled 2020-11-08 (×5): qty 1

## 2020-11-08 MED ORDER — ENOXAPARIN SODIUM 40 MG/0.4ML IJ SOSY
40.0000 mg | PREFILLED_SYRINGE | INTRAMUSCULAR | Status: DC
  Administered 2020-11-08 – 2020-11-09 (×2): 40 mg via SUBCUTANEOUS
  Filled 2020-11-08 (×2): qty 0.4

## 2020-11-08 MED ORDER — POTASSIUM CHLORIDE CRYS ER 20 MEQ PO TBCR
40.0000 meq | EXTENDED_RELEASE_TABLET | Freq: Once | ORAL | Status: AC
  Administered 2020-11-08: 40 meq via ORAL
  Filled 2020-11-08: qty 2

## 2020-11-08 MED ORDER — INSULIN ASPART 100 UNIT/ML IJ SOLN: 0.0000 [IU] | Freq: Three times a day (TID) | INTRAMUSCULAR | Status: DC

## 2020-11-08 NOTE — Addendum Note (Signed)
Addendum  created 11/08/20 1414 by Murvin Natal, MD   Clinical Note Signed

## 2020-11-08 NOTE — Progress Notes (Signed)
    3 Days Post-Op  Subjective: Had an episode of orthostasis while getting up yesterday. Was able to walk to the chair but has not ambulated any further. Vitals have otherwise been within normal limits. Labs unremarkable. Foley replaced for urinary retention. Excellent pain control, no nausea/vomiting, passing flatus.  Objective: Vital signs in last 24 hours: Temp:  [97.6 F (36.4 C)-98.6 F (37 C)] 98.4 F (36.9 C) (07/28 0400) Pulse Rate:  [71-100] 73 (07/28 0600) Resp:  [10-25] 16 (07/28 0600) BP: (105-137)/(62-95) 115/72 (07/28 0600) SpO2:  [90 %-97 %] 92 % (07/28 0600) Last BM Date: 11/05/20  Intake/Output from previous day: 07/27 0701 - 07/28 0700 In: 2330.6 [P.O.:840; I.V.:1340.7] Out: 1640 [Urine:1585; Drains:55] Intake/Output this shift: No intake/output data recorded.  PE: General: resting comfortably, NAD Neuro: alert and oriented, no focal deficits Resp: normal work of breathing on room air CV: RRR Abdomen: soft, nondistended, nontender to palpation. Midline incision c/d/I with no erythema or induration. JP x2 with serosanguinous drainage. RUQ biliary drain capped. Extremities: warm and well-perfused GU: foley draining clear yellow urine   Lab Results:  Recent Labs    11/07/20 0255 11/08/20 0252  WBC 8.4 5.8  HGB 9.4* 8.9*  HCT 29.1* 27.0*  PLT 212 210   BMET Recent Labs    11/07/20 0255 11/08/20 0252  NA 134* 136  K 3.7 3.2*  CL 106 106  CO2 21* 25  GLUCOSE 124* 95  BUN 11 7  CREATININE 0.57 0.42*  CALCIUM 7.8* 7.9*   PT/INR No results for input(s): LABPROT, INR in the last 72 hours. CMP     Component Value Date/Time   NA 136 11/08/2020 0252   NA 140 05/24/2018 1030   K 3.2 (L) 11/08/2020 0252   CL 106 11/08/2020 0252   CO2 25 11/08/2020 0252   GLUCOSE 95 11/08/2020 0252   BUN 7 11/08/2020 0252   BUN 17 05/24/2018 1030   CREATININE 0.42 (L) 11/08/2020 0252   CALCIUM 7.9 (L) 11/08/2020 0252   PROT 4.2 (L) 11/08/2020 0252   PROT  6.9 05/24/2018 1030   ALBUMIN 1.7 (L) 11/08/2020 0252   ALBUMIN 4.3 05/24/2018 1030   AST 16 11/08/2020 0252   ALT 34 11/08/2020 0252   ALKPHOS 113 11/08/2020 0252   BILITOT 0.4 11/08/2020 0252   BILITOT 0.4 05/24/2018 1030   GFRNONAA >60 11/08/2020 0252   GFRAA 115 05/24/2018 1030   Lipase     Component Value Date/Time   LIPASE 17 09/12/2020 1510       Studies/Results: No results found.      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 - Advance to soft diet - SLIV - Pain control: Request removal of epidural today (last dose SQH at 6am, hold further doses). Patient is tolerating PO, continue scheduled tylenol, prn oxycodone and dilaudid. - Remove foley after epidural has been removed - POD3 drain amylases pending, will remove one of the drains if amylase is low - Home medications as appropriate - VTE: SCDs, transition to prophylactic lovenox after epidural removed - Dispo: transfer to med-surg floor this afternoon    LOS: 3 days    Michaelle Birks, MD [facility] Surgery General, Hepatobiliary and Pancreatic Surgery 11/08/20 7:27 AM

## 2020-11-08 NOTE — Anesthesia Post-op Follow-up Note (Signed)
  Anesthesia Pain Follow-up Note  Patient: Brittany [name]  Day #: 4  Date of Follow-up: 11/08/2020 Time: 2:13 PM  Last Vitals:  Vitals:   11/08/20 1300 11/08/20 1400  BP: 123/69   Pulse: 89 79  Resp: (!) 21 16  Temp:    SpO2: 95% 94%    Level of Consciousness: alert  Pain: none   Side Effects:None  Catheter Site Exam:clean, dry, no drainage  Anti-Coag Meds (From admission, onward)   Start     Dose/Rate Route Frequency Ordered Stop   11/08/20 2000  enoxaparin (LOVENOX) injection 40 mg        40 mg Subcutaneous Every 24 hours 11/08/20 0733      Epidural / Intrathecal (From admission, onward)   Start     Dose/Rate Route Frequency Ordered Stop   11/05/20 1545  ropivacaine (PF) 2 mg/mL (0.2%) (NAROPIN) injection        6 mL/hr 6 mL/hr  Epidural Continuous 11/05/20 1531         Plan: Catheter removed/tip intact at surgeon's request  Dawsyn Ramsaran P Almon Whitford

## 2020-11-08 NOTE — Progress Notes (Signed)
Physical Therapy Treatment Patient Details Name: Brittany [name] MRN: [id number] DOB: 15-Mar-1966 Today's Date: 11/08/2020    History of Present Illness Pt is a 55 y.o. F who presented for evaluation of distal CBD stricture, suspicious for malignancy. 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 Significant PMH: HTN, gastric bypass.    PT Comments    Pt was able to progress to ambulating a short distance within her room with a RW this date. Pt only needing min guard assist for all mobility at this time, but is at risk for falls due to her slow gait speed and symptomatic orthostatic hypotension, see General Comments below. Performed seated exercises to address her noted lower extremity strength that impacts her ease with transfers and gait. Will continue to follow acutely. Current recommendations remain appropriate.    Follow Up Recommendations  Home health PT;Supervision/Assistance - 24 hour     Equipment Recommendations  3in1 (PT)    Recommendations for Other Services       Precautions / Restrictions Precautions Precautions: Fall;Other (comment) Precaution Comments: Watch BP, epidural, PCA, x2 JP bulbs, biliary drain Restrictions Weight Bearing Restrictions: No    Mobility  Bed Mobility Overal bed mobility: Needs Assistance Bed Mobility: Rolling;Sidelying to Sit Rolling: Min guard Sidelying to sit: Min guard;HOB elevated       General bed mobility comments: Cues for log roll technique, pt rolling towards left, min guard assist. Transition to sit with HOB minimally elevated, min guard assist and extra time.    Transfers Overall transfer level: Needs assistance Equipment used: Rolling walker (2 wheeled) Transfers: Sit to/from [facility] Sit to Stand: Min guard Stand pivot transfers: Min guard       General transfer comment: Sit to stand 1x from EOB and 1x from recliner with extra to power up and extra rocking attempts from lower  surfaces, min guard assist for safety. Stand step transfer bed > recliner towards L with RW with min guard assist and extra time.  Ambulation/Gait Ambulation/Gait assistance: Min guard Gait Distance (Feet): 5 Feet Assistive device: Rolling walker (2 wheeled) Gait Pattern/deviations: Step-through pattern;Decreased stride length;Trunk flexed Gait velocity: reduced Gait velocity interpretation: <1.8 ft/sec, indicate of risk for recurrent falls General Gait Details: Pt with trunk flexed, likely to guard abdomen. Pt taking small, short steps and ambulating slowly, no LOB, min guard assist.   Stairs             Wheelchair Mobility    Modified Rankin (Stroke Patients Only)       Balance Overall balance assessment: Needs assistance Sitting-balance support: Feet supported Sitting balance-Leahy Scale: Good Sitting balance - Comments: No LOB, supervision sitting statically EOB.   Standing balance support: Bilateral upper extremity supported;During functional activity Standing balance-Leahy Scale: Poor Standing balance comment: Bil UE support, no LOB, min guard assist.                            Cognition Arousal/Alertness: Awake/alert Behavior During Therapy: WFL for tasks assessed/performed Overall Cognitive Status: Within Functional Limits for tasks assessed                                        Exercises General Exercises - Lower Extremity Gluteal Sets: Both;15 reps;Seated Long Arc Quad: Both;Seated;15 reps Hip ABduction/ADduction: Both;Other reps (comment);Seated (x30 reps) Hip Flexion/Marching: Both;10 reps;Seated (to tolerance) Toe Raises: Both;20 reps;Seated  Heel Raises: Both;20 reps;Seated    General Comments General comments (skin integrity, edema, etc.): BP supine 129/75; BP sitting 127/78 with mild momentary dizziness, BP standing 104/79 with no dizziness, BP sitting after transferring to chair 129/85, 2nd stand reported very dizzy,  returned to sit BP 124/89 after several minutes with dizziness gone, BP 119/77 after seated exercises and no dizziness      Pertinent Vitals/Pain Pain Assessment: 0-10 Pain Score: 3  Pain Location: surgical site, JP drain sites Pain Descriptors / Indicators: Operative site guarding;Discomfort;Grimacing;Dull Pain Intervention(s): Limited activity within patient's tolerance;Monitored during session;Repositioned    Home Living                      Prior Function            PT Goals (current goals can now be found in the care plan section) Acute Rehab PT Goals Patient Stated Goal: to walk PT Goal Formulation: With patient Time For Goal Achievement: 11/20/20 Potential to Achieve Goals: Good Progress towards PT goals: Progressing toward goals    Frequency    Min 3X/week      PT Plan Current plan remains appropriate    Co-evaluation              AM-PAC PT "6 Clicks" Mobility   Outcome Measure  Help needed turning from your back to your side while in a flat bed without using bedrails?: A Little Help needed moving from lying on your back to sitting on the side of a flat bed without using bedrails?: A Little Help needed moving to and from a bed to a chair (including a wheelchair)?: A Little Help needed standing up from a chair using your arms (e.g., wheelchair or bedside chair)?: A Little Help needed to walk in hospital room?: A Little Help needed climbing 3-5 steps with a railing? : A Lot 6 Click Score: 17    End of Session Equipment Utilized During Treatment: Gait belt Activity Tolerance: Other (comment) (limited by hypotension) Patient left: with call bell/phone within reach;in chair;with chair alarm set Nurse Communication: Mobility status;Other (comment) (vitals) PT Visit Diagnosis: Pain;Difficulty in walking, not elsewhere classified (R26.2);Other abnormalities of gait and mobility (R26.89);Muscle weakness (generalized) (M62.81) Pain - part of body:   (abdomen)     Time: [id number] PT Time Calculation (min) (ACUTE ONLY): 34 min  Charges:  $Therapeutic Exercise: 8-22 mins $Therapeutic Activity: 8-22 mins                     Moishe Spice, PT, DPT Acute Rehabilitation Services  Pager: [phone] Office: [place] 11/08/2020, 9:16 AM

## 2020-11-09 ENCOUNTER — Telehealth: Payer: Self-pay

## 2020-11-09 LAB — CBC
HCT: 27.4 % — ABNORMAL LOW (ref 36.0–46.0)
Hemoglobin: 9.1 g/dL — ABNORMAL LOW (ref 12.0–15.0)
MCH: 32 pg (ref 26.0–34.0)
MCHC: 33.2 g/dL (ref 30.0–36.0)
MCV: 96.5 fL (ref 80.0–100.0)
Platelets: 262 10*3/uL (ref 150–400)
RBC: 2.84 MIL/uL — ABNORMAL LOW (ref 3.87–5.11)
RDW: 14.3 % (ref 11.5–15.5)
WBC: 5.7 10*3/uL (ref 4.0–10.5)
nRBC: 0 % (ref 0.0–0.2)

## 2020-11-09 LAB — COMPREHENSIVE METABOLIC PANEL
ALT: 41 U/L (ref 0–44)
AST: 35 U/L (ref 15–41)
Albumin: 1.7 g/dL — ABNORMAL LOW (ref 3.5–5.0)
Alkaline Phosphatase: 160 U/L — ABNORMAL HIGH (ref 38–126)
Anion gap: 7 (ref 5–15)
BUN: 7 mg/dL (ref 6–20)
CO2: 26 mmol/L (ref 22–32)
Calcium: 7.9 mg/dL — ABNORMAL LOW (ref 8.9–10.3)
Chloride: 106 mmol/L (ref 98–111)
Creatinine, Ser: 0.43 mg/dL — ABNORMAL LOW (ref 0.44–1.00)
GFR, Estimated: >60 mL/min (ref >60)
Glucose, Bld: 90 mg/dL (ref 70–99)
Potassium: 3.6 mmol/L (ref 3.5–5.1)
Sodium: 139 mmol/L (ref 135–145)
Total Bilirubin: 0.3 mg/dL (ref 0.3–1.2)
Total Protein: 4.3 g/dL — ABNORMAL LOW (ref 6.5–8.1)

## 2020-11-09 LAB — GLUCOSE, CAPILLARY
Glucose-Capillary: 84 mg/dL (ref 70–99)
Glucose-Capillary: 105 mg/dL — ABNORMAL HIGH (ref 70–99)

## 2020-11-09 MED ORDER — METOPROLOL SUCCINATE ER 25 MG PO TB24
25.0000 mg | ORAL_TABLET | Freq: Every day | ORAL | Status: DC
  Administered 2020-11-09 – 2020-11-10 (×2): 25 mg via ORAL
  Filled 2020-11-09 (×2): qty 1

## 2020-11-09 MED ORDER — OXYCODONE HCL 5 MG PO TABS: 5.0000 mg | ORAL_TABLET | ORAL | 0 refills | Status: DC | PRN

## 2020-11-09 MED ORDER — DOCUSATE SODIUM 100 MG PO CAPS: 100.0000 mg | ORAL_CAPSULE | Freq: Two times a day (BID) | ORAL | 0 refills | Status: AC

## 2020-11-09 MED ORDER — PANTOPRAZOLE SODIUM 40 MG PO TBEC: 40.0000 mg | DELAYED_RELEASE_TABLET | Freq: Every day | ORAL | 11 refills | Status: DC

## 2020-11-09 NOTE — Telephone Encounter (Signed)
Brittany [name] [id number]  [facility]  We have just received more forms/or request for medical records.  We are unable to give them to her since it is from the hospital and pt is currently still admitted.  I have faxed the request to [facility] Records Dept. @ [phone].  If [name] gives [place] a call back we can give her that fax number as well to receive MR.

## 2020-11-09 NOTE — Progress Notes (Signed)
    4 Days Post-Op  Subjective: Afebrile, vitals stable. Ambulated yesterday. Epidural and foley removed, voiding spontaneously. Pain is very well-controlled. Low drain amylases, JP#1 removed yesterday. Patient is passing flatus and having bowel movements.  Objective: Vital signs in last 24 hours: Temp:  [97.8 F (36.6 C)-98.5 F (36.9 C)] 98.2 F (36.8 C) (07/29 0333) Pulse Rate:  [76-100] 80 (07/29 0530) Resp:  [13-25] 20 (07/28 2100) BP: (90-143)/(69-97) 141/85 (07/29 0804) SpO2:  [93 %-100 %] 93 % (07/29 0333) Last BM Date: 11/08/20  Intake/Output from previous day: 07/28 0701 - 07/29 0700 In: 1039.5 [P.O.:800; I.V.:180.7] Out: 1180 [Urine:1150; Drains:30] Intake/Output this shift: Total I/O In: 120 [P.O.:120] Out: 10 [Drains:10]  PE: General: resting comfortably, NAD Neuro: alert and oriented, no focal deficits Resp: normal work of breathing on room air Abdomen: soft, nondistended, nontender to palpation. Midline incision c/d/I with no erythema or induration. JP x1 with serosanguinous drainage. RUQ biliary drain capped. Extremities: warm and well-perfused   Lab Results:  Recent Labs    11/08/20 0252 11/09/20 0205  WBC 5.8 5.7  HGB 8.9* 9.1*  HCT 27.0* 27.4*  PLT 210 262   BMET Recent Labs    11/08/20 0252 11/09/20 0205  NA 136 139  K 3.2* 3.6  CL 106 106  CO2 25 26  GLUCOSE 95 90  BUN 7 7  CREATININE 0.42* 0.43*  CALCIUM 7.9* 7.9*   PT/INR No results for input(s): LABPROT, INR in the last 72 hours. CMP     Component Value Date/Time   NA 139 11/09/2020 0205   NA 140 05/24/2018 1030   K 3.6 11/09/2020 0205   CL 106 11/09/2020 0205   CO2 26 11/09/2020 0205   GLUCOSE 90 11/09/2020 0205   BUN 7 11/09/2020 0205   BUN 17 05/24/2018 1030   CREATININE 0.43 (L) 11/09/2020 0205   CALCIUM 7.9 (L) 11/09/2020 0205   PROT 4.3 (L) 11/09/2020 0205   PROT 6.9 05/24/2018 1030   ALBUMIN 1.7 (L) 11/09/2020 0205   ALBUMIN 4.3 05/24/2018 1030   AST 35  11/09/2020 0205   ALT 41 11/09/2020 0205   ALKPHOS 160 (H) 11/09/2020 0205   BILITOT 0.3 11/09/2020 0205   BILITOT 0.4 05/24/2018 1030   GFRNONAA >60 11/09/2020 0205   GFRAA 115 05/24/2018 1030   Lipase     Component Value Date/Time   LIPASE 17 09/12/2020 1510       Studies/Results: No results found.      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 - Continue soft diet - Pain control: scheduled tylenol, prn oxycodone and dilaudid - Remaining JP drain removed today. Will keep internal/external biliary drain in place, flush daily. - Home medications as appropriate - Ambulate, IS, PT following - VTE: SCDs, lovenox - Dispo: awaiting med-surg bed. Anticipate discharge home tomorrow.    LOS: 4 days    Michaelle Birks, MD [facility] Surgery General, Hepatobiliary and Pancreatic Surgery 11/09/20 8:36 AM

## 2020-11-09 NOTE — Progress Notes (Signed)
Occupational Therapy Treatment Patient Details Name: Brittany [name] MRN: [id number] DOB: 1965/04/30 Today's Date: 11/09/2020    History of present illness Pt is a 55 y.o. F who presented for evaluation of distal CBD stricture, suspicious for malignancy. 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 Significant PMH: HTN, gastric bypass.   OT comments  Pt is at adequate level for d/c home with family (A). Pt progress educated on 3n1 use and setup at home. Talked about home surfaces and adaptations for easier transfer.  All education is complete and patient indicates understanding.   Follow Up Recommendations  Home health OT;Supervision/Assistance - 24 hour    Equipment Recommendations  3 in 1 bedside commode    Recommendations for Other Services      Precautions / Restrictions Precautions Precautions: Fall;Other (comment) Precaution Comments: Watch BP, epidural, PCA, x2 JP bulbs, biliary drain       Mobility Bed Mobility Overal bed mobility: Modified Independent             General bed mobility comments: educated on pillows use of wedge and log roll seqence    Transfers Overall transfer level: Needs assistance Equipment used: Rolling walker (2 wheeled) Transfers: Sit to/from [facility] Sit to Stand: Supervision              Balance Overall balance assessment: Needs assistance Sitting-balance support: Bilateral upper extremity supported;Feet supported Sitting balance-Leahy Scale: Good                                     ADL either performed or assessed with clinical judgement   ADL Overall ADL's : Needs assistance/impaired                         Toilet Transfer: Modified Independent           Functional mobility during ADLs: Rolling walker;Modified independent       Vision       Perception     Praxis      Cognition Arousal/Alertness: Awake/alert Behavior During Therapy: WFL for  tasks assessed/performed Overall Cognitive Status: Within Functional Limits for tasks assessed                                          Exercises     Shoulder Instructions       General Comments      Pertinent Vitals/ Pain       Pain Assessment: Faces Faces Pain Scale: Hurts a little bit Pain Location: surgical site, JP drain sites Pain Descriptors / Indicators: Operative site guarding;Discomfort;Grimacing;Dull Pain Intervention(s): Monitored during session;Repositioned  Home Living                                          Prior Functioning/Environment              Frequency  Min 2X/week        Progress Toward Goals  OT Goals(current goals can now be found in the care plan section)  Progress towards OT goals: Goals met/education completed, patient discharged from OT  Acute Rehab OT Goals Patient Stated Goal: to walk OT Goal Formulation: With patient Time For Goal Achievement:  11/21/20 Potential to Achieve Goals: Good ADL Goals Pt Will Perform Grooming: with min guard assist;standing Pt Will Perform Lower Body Dressing: with min assist;with adaptive equipment;sit to/from stand Pt Will Transfer to Toilet: with min guard assist;ambulating;regular height toilet Pt Will Perform Toileting - Clothing Manipulation and hygiene: with min assist;sit to/from stand Additional ADL Goal #1: pt will tolerate x10 mins of OOB ADL tasks with minimal cues for proper body mechanics to decrease pain.  Plan Discharge plan remains appropriate    Co-evaluation                 AM-PAC OT "6 Clicks" Daily Activity     Outcome Measure   Help from another person eating meals?: None Help from another person taking care of personal grooming?: None Help from another person toileting, which includes using toliet, bedpan, or urinal?: None Help from another person bathing (including washing, rinsing, drying)?: None Help from another person to put  on and taking off regular upper body clothing?: None Help from another person to put on and taking off regular lower body clothing?: None 6 Click Score: 24    End of Session Equipment Utilized During Treatment: Rolling walker  OT Visit Diagnosis: Unsteadiness on feet (R26.81);Muscle weakness (generalized) (M62.81);Pain   Activity Tolerance Patient tolerated treatment well   Patient Left in bed;with call bell/phone within reach   Nurse Communication Mobility status        Time: 2500-3704 OT Time Calculation (min): 18 min  Charges: OT General Charges $OT Visit: 1 Visit OT Treatments $Self Care/Home Management : 8-22 mins   Brynn, OTR/L  Acute Rehabilitation Services Pager: [phone] Office: [phone] .    Jeri Modena 11/09/2020, 3:59 PM

## 2020-11-09 NOTE — TOC Initial Note (Signed)
Transition of Care [facility]) - Initial/Assessment Note    Patient Details  Name: Brittany [name] MRN: [id number] Date of Birth: [birth year]  [facility] of [facility]) CM/SW Contact:    Ella Bodo, RN Phone Number: 11/09/2020, 3:37 PM  Clinical Narrative:   Pt is a 55 y.o. F who presented for evaluation of distal CBD stricture, suspicious for malignancy. 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  Prior to admission, patient independent and living at home alone.  Possible discharge over the weekend; patient states she will have assistance from daughter, granddaughter, and a friend.  PT/OT recommending home health follow-up; unfortunately unable to obtain home health services due to to payor and staffing issues.  Patient agreeable to referral for outpatient PT/OT, and states that she will have transportation to appointments.  Referral to [place] for 3 in 1 bedside commode, to be delivered to bedside prior to discharge.  Patient appreciative of my visit.                Expected Discharge Plan: OP Rehab Barriers to Discharge: Continued Medical Work up   Patient Goals and CMS Choice Patient states their goals for this hospitalization and ongoing recovery are:: to go home      Expected Discharge Plan and Services Expected Discharge Plan: OP Rehab   Discharge Planning Services: CM Consult   Living arrangements for the past 2 months: Single Family Home                 DME Arranged: 3-N-1 DME Agency: [employer] Date DME Agency Contacted: 11/09/20 Time DME Agency Contacted: [phone] Representative spoke with at DME Agency: sheila            Prior Living Arrangements/Services Living arrangements for the past 2 months: [place] with:: Self Patient language and need for interpreter reviewed:: Yes Do you feel safe going back to the place where you live?: Yes      Need for Family Participation in Patient Care: Yes (Comment) Care giver support  system in place?: Yes (comment)   Criminal Activity/Legal Involvement Pertinent to Current Situation/Hospitalization: No - Comment as needed  Activities of Daily Living Home Assistive Devices/Equipment: Contact lenses ADL Screening (condition at time of admission) Patient's cognitive ability adequate to safely complete daily activities?: Yes Is the patient deaf or have difficulty hearing?: No Does the patient have difficulty seeing, even when wearing glasses/contacts?: No Does the patient have difficulty concentrating, remembering, or making decisions?: No Patient able to express need for assistance with ADLs?: Yes Does the patient have difficulty dressing or bathing?: Yes Independently performs ADLs?: Yes (appropriate for developmental age) Does the patient have difficulty walking or climbing stairs?: Yes Weakness of Legs: None Weakness of Arms/Hands: None                   Emotional Assessment Appearance:: Appears stated age Attitude/Demeanor/Rapport: Engaged Affect (typically observed): Accepting Orientation: : Oriented to Self, Oriented to Place, Oriented to  Time, Oriented to Situation      Admission diagnosis:  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] Biliary stricture [K83.1] Patient Active Problem List   Diagnosis Date Noted   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   Abdominal pain 09/13/2020   Biliary obstruction 09/12/2020   Obstructive jaundice 09/08/2020   Elevated LFTs 09/08/2020   Choledocholithiasis 09/07/2020   Urticaria    Ulcer, stomach peptic    Sleep apnea    Positive TB test    Migraines    Hypercholesterolemia    History of  chicken pox    H/O hiatal hernia    Blood transfusion without reported diagnosis    Arthritis    Anxiety    Angio-edema    Allergy    Allergic contact dermatitis 07/09/2020   Chronic idiopathic urticaria 01/17/2019   Seasonal and perennial allergic rhinoconjunctivitis 11/08/2018   Allergic reaction 11/08/2018   Diarrhea 11/08/2018   Right  thigh pain 05/19/2018   Melena    Acute gastrojejunal anastomotic ulcer    Macrocytic anemia    Acute GI bleeding 09/28/2017   Epigastric abdominal pain 11/19/2016   Incisional hernia with obstruction but no gangrene 07/21/2016   Menopause 04/22/2016   Routine general medical examination at a health care facility 03/18/2016   Obesity 03/18/2016   Severe episode of recurrent major depressive disorder, without psychotic features ([place])    Depression, major, severe recurrence ([place]) 09/11/2015   Major depressive disorder, recurrent, severe without psychotic features ([place])    MDD (major depressive disorder) 10/30/2014   MDD (major depressive disorder), recurrent severe, without psychosis ([place])    Suicidal ideation 10/05/2014   Depression 06/12/2014   Rash 01/04/2014   Normocytic anemia 01/04/2014   DJD (degenerative joint disease) 01/04/2014   Hypertension 01/04/2014   Dyslipidemia 01/04/2014   GERD (gastroesophageal reflux disease) 01/04/2014   Obstructive sleep apnea 01/04/2014   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  Reinaldo Raddle, RN, BSN  Trauma/Neuro ICU Case Manager [phone]

## 2020-11-09 NOTE — Discharge Instructions (Addendum)
[facility] DISCHARGE INSTRUCTIONS: WHIPPLE PROCEDURE  Activity No heavy lifting greater than 10 pounds for 6 weeks after surgery. Ok to shower, but do not bathe or submerge your incision or drain underwater. Do not drive while taking narcotic pain medication. Be sure to walk around your home at least 3 times daily. This will help avoid blood clots and will improve your strength.  Wound Care Your incision is covered with skin glue called Dermabond. This will peel off on its own over time. You may shower and allow warm soapy water to run over your incisions. Gently pat dry. Do not submerge your incision underwater. Monitor your incision for any new redness, tenderness, or drainage. Continue flushing your drain at home twice daily with 10 mL sterile saline.  Medications You may take Tylenol up to 4 times daily for mild to moderate pain. Do not take any more than 4000 mg of Tylenol in a 24-hour period. If you have more severe pain that is not covered by Tylenol, you may take oxycodone up to every 6 hours as needed. Please reserve this medication only for the most severe pain. You should begin taking Protonix (omeprazole) once every day. This is an antacid medication that will help prevent you from getting ulcers.  Diet You may notice that you feel full early after meals, or have occasional nausea or decreased appetite. This is normal following a Whipple and will improve with time. It is better to consume small frequent meals throughout the day rather than 3 large meals. Avoid drinking a lot of carbonated beverages, as these can worsen symptoms of nausea and reflux. If you are having frequent vomiting after meals or are unable to keep down any food or drink, please call the office right away.  When to Call us: Fever greater than 100.5 New redness, drainage, or swelling at incision site Severe pain, nausea, or vomiting Excessive vomiting or inability to keep down any  liquids Jaundice (yellowing of the whites of the eyes or skin)  Follow-up You have an appointment scheduled with Dr. Zenia Resides on November 28, 2020 at 3:45pm. This will be at the [facility] office at [address], [place], [place], [place]. Please arrive at least 15 minutes prior to your scheduled appointment time.  For questions or concerns, please call the office at [phone].

## 2020-11-09 NOTE — Progress Notes (Signed)
Transfer from 4N. Alert and oriented, niot in any distress. Dressing intact, dressing to her right upper abdomen changed. Will continue to monitor. Denies pain.

## 2020-11-10 LAB — COMPREHENSIVE METABOLIC PANEL
ALT: 57 U/L — ABNORMAL HIGH (ref 0–44)
AST: 48 U/L — ABNORMAL HIGH (ref 15–41)
Albumin: 1.6 g/dL — ABNORMAL LOW (ref 3.5–5.0)
Alkaline Phosphatase: 168 U/L — ABNORMAL HIGH (ref 38–126)
Anion gap: 7 (ref 5–15)
BUN: 7 mg/dL (ref 6–20)
CO2: 27 mmol/L (ref 22–32)
Calcium: 7.7 mg/dL — ABNORMAL LOW (ref 8.9–10.3)
Chloride: 104 mmol/L (ref 98–111)
Creatinine, Ser: 0.42 mg/dL — ABNORMAL LOW (ref 0.44–1.00)
GFR, Estimated: >60 mL/min (ref >60)
Glucose, Bld: 88 mg/dL (ref 70–99)
Potassium: 3.4 mmol/L — ABNORMAL LOW (ref 3.5–5.1)
Sodium: 138 mmol/L (ref 135–145)
Total Bilirubin: 0.5 mg/dL (ref 0.3–1.2)
Total Protein: 4.3 g/dL — ABNORMAL LOW (ref 6.5–8.1)

## 2020-11-10 NOTE — Plan of Care (Signed)

## 2020-11-10 NOTE — Progress Notes (Signed)
Discharge home. Home discharge instruction given, no question verbalized. 

## 2020-11-12 ENCOUNTER — Ambulatory Visit: Payer: 59 | Admitting: Allergy

## 2020-11-12 DIAGNOSIS — J309 Allergic rhinitis, unspecified: Secondary | ICD-10-CM

## 2020-11-12 LAB — TYPE AND SCREEN
ABO/RH(D): O POS
Antibody Screen: NEGATIVE
Unit division: 0
Unit division: 0
Unit division: 0
Unit division: 0

## 2020-11-12 LAB — BPAM RBC
Blood Product Expiration Date: 202208262359
Blood Product Expiration Date: 202208272359
Blood Product Expiration Date: 202208272359
Blood Product Expiration Date: 202208272359
ISSUE DATE / TIME: 202207291343
ISSUE DATE / TIME: 202207291343
Unit Type and Rh: 5100
Unit Type and Rh: 5100
Unit Type and Rh: 5100
Unit Type and Rh: 5100

## 2020-11-12 NOTE — Progress Notes (Deleted)
Follow Up Note  RE: Brittany [name] MRN: [id number] DOB: 04-15-1965 Date of Office Visit: 11/12/2020  Referring provider: [name],* Primary care provider: [name], FNP  Chief Complaint: No chief complaint on file.  History of Present Illness: I had the pleasure of seeing Brittany [name] for a follow up visit at the [facility] on 11/12/2020. She is a 55 y.o. female, who is being followed for chronic idiopathic urticaria, allergic rhinoconjunctivitis and contact dermatitis. Her previous allergy office visit was on 07/09/2020 with Dr. Maudie Mercury. Today is a regular follow up visit.  Chronic idiopathic urticaria Past history - 2020 blood work was unremarkable for CBC diff, alpha gal, ANA, thyroid, CU index, H. pylori test, tryptase.  ESR was slightly elevated. Triggers include stress. Interim history - Unable to wean off antihistamines as it worsened her allergy symptoms. No hive outbreaks.  Continue with zyrtec 31m in the morning and 127min the evening. DECREASE famotidine 2046mo once a day Weaning schedule: Take zyrtec 91m38m the morning, 20mg34mthe evening + famotidine 20mg 29m a day. If no hives for 2-3 weeks: Take zyrtec 91mg t60m a day + famotidine 20mg on9m day. If no hives for 2-3 weeks: Take zyrtec 91mg twi23m day and stop famotidine. Continue with Xolair injections 300mg ever29mweeks.  May use hydroxyzine 25mg every61mours for breakthrough symptoms. Avoid the following potential triggers: alcohol, tight clothing, NSAIDs, heat.   Seasonal and perennial allergic rhinoconjunctivitis Past history - 2020 skin testing was positive to ragweed, weed, dust mite. Interim history - stable. Continue environmental control measures. Continue antihistamines as above. Continue Singulair (montelukast) 10 mg daily at night.  May use pataday 1 drop in each as daily as needed for itchy/watery eyes. May use Flonase (fluticasone) nasal spray 1 spray per  nostril twice a day as needed for nasal congestion. Nasal saline spray (i.e., Simply Saline) or nasal saline lavage (i.e., NeilMed) is recommended as needed and prior to medicated nasal sprays.   Allergic contact dermatitis Most likely having contact dermatitis on the eyelids. Keep track of the rash and take pictures. If this continues to happen then recommend patch testing next. See below for proper skin care. May use OTC hydrocortisone prn.   Return in about 4 months (around 11/08/2020).  Assessment and Plan: Brittany [name] is a 54 y.o. fem39e with: No problem-specific Assessment & Plan notes found for this encounter.  No follow-ups on file.  No orders of the defined types were placed in this encounter.  Lab Orders  No laboratory test(s) ordered today    Diagnostics: Spirometry:  Tracings reviewed. Her effort: {Blank single:19197::"Good reproducible efforts.","It was hard to get consistent efforts and there is a question as to whether this reflects a maximal maneuver.","Poor effort, data can not be interpreted."} FVC: ***L FEV1: ***L, ***% predicted FEV1/FVC ratio: ***% Interpretation: {Blank single:19197::"Spirometry consistent with mild obstructive disease","Spirometry consistent with moderate obstructive disease","Spirometry consistent with severe obstructive disease","Spirometry consistent with possible restrictive disease","Spirometry consistent with mixed obstructive and restrictive disease","Spirometry uninterpretable due to technique","Spirometry consistent with normal pattern","No overt abnormalities noted given today's efforts"}.  Please see scanned spirometry results for details.  Skin Testing: {Blank single:19197::"Select foods","Environmental allergy panel","Environmental allergy panel and select foods","Food allergy panel","None","Deferred due to recent antihistamines use"}. Positive test to: ***. Negative test to: ***.  Results discussed with patient/family.   Medication  List:  Current Outpatient Medications  Medication Sig Dispense Refill  . acetaminophen (TYLENOL) 500  MG tablet Take 500 mg by mouth 2 (two) times daily as needed for moderate pain or headache.    . calcium-vitamin D (OSCAL WITH D) 500-200 MG-UNIT tablet Take 1 tablet by mouth every evening. For bone health    . cetirizine (ZYRTEC) 10 MG tablet Take 10 mg by mouth 2 (two) times daily.    . cholecalciferol (VITAMIN D) 1000 units tablet Take 1 tablet (1,000 Units total) by mouth daily. For bone health    . docusate sodium (COLACE) 100 MG capsule Take 1 capsule (100 mg total) by mouth 2 (two) times daily for 14 days. 28 capsule 0  . DULoxetine (CYMBALTA) 60 MG capsule Take 1 capsule (60 mg total) by mouth 2 (two) times daily. 180 capsule 0  . EPINEPHrine (EPIPEN 2-PAK) 0.3 mg/0.3 mL IJ SOAJ injection Inject 0.3 mg into the muscle as needed for anaphylaxis. 0.3 mL 1  . LORazepam (ATIVAN) 0.5 MG tablet Take 1 tablet (0.5 mg total) by mouth daily as needed for anxiety. 15 tablet 0  . metoprolol succinate (TOPROL XL) 25 MG 24 hr tablet Take 1 tablet (25 mg total) by mouth daily. 90 tablet 3  . montelukast (SINGULAIR) 10 MG tablet TAKE 1 TABLET(10 MG) BY MOUTH AT BEDTIME 90 tablet 0  . Multiple Vitamin (MULTI-VITAMIN) tablet Take 1 tablet by mouth daily.    Marland Kitchen oxyCODONE (OXY IR/ROXICODONE) 5 MG immediate release tablet Take 1 tablet (5 mg total) by mouth every 4 (four) hours as needed for severe pain. 20 tablet 0  . pantoprazole (PROTONIX) 40 MG tablet Take 1 tablet (40 mg total) by mouth daily. 30 tablet 11  . QUEtiapine (SEROQUEL) 50 MG tablet TAKE 3 TABLETS(150 MG) BY MOUTH AT BEDTIME 90 tablet 2  . vitamin B-12 (CYANOCOBALAMIN) 500 MCG tablet Take 1 tablet (500 mcg total) by mouth daily. For Vitamin B-12 replacement (Patient taking differently: Take 500 mcg by mouth daily.)    . vitamin E 1000 UNIT capsule Take 1,000 Units by mouth daily.    Arvid Right 150 MG injection INJECT 300 MG UNDER THE SKIN EVERY  14 DAYS (Patient taking differently: Inject 300 mg into the skin every 28 (twenty-eight) days.) 4 each 11   Current Facility-Administered Medications  Medication Dose Route Frequency Provider Last Rate Last Admin  . omalizumab Arvid Right) injection 300 mg  300 mg Subcutaneous Q28 days Garnet Sierras, DO   300 mg at 10/23/20 1846   Allergies: Allergies  Allergen Reactions  . Contrast Media [Iodinated Diagnostic Agents] Hives  . Amoxicillin Hives    .Marland KitchenHas patient had a PCN reaction causing immediate rash, facial/tongue/throat swelling, SOB or lightheadedness with hypotension: Yes Has patient had a PCN reaction causing severe rash involving mucus membranes or skin necrosis: No Has patient had a PCN reaction that required hospitalization No Has patient had a PCN reaction occurring within the last 10 years: No If all of the above answers are "NO", then may proceed with Cephalosporin use.   . Bactrim [Sulfamethoxazole-Trimethoprim] Hives and Rash  . Lamictal [Lamotrigine] Hives and Rash   I reviewed her past medical history, social history, family history, and environmental history and no significant changes have been reported from her previous visit.  Review of Systems  Constitutional:  Negative for appetite change, chills, fatigue, fever and unexpected weight change.  HENT:  Negative for congestion, rhinorrhea, sinus pressure and sneezing.   Eyes:  Negative for pain and itching.  Respiratory:  Negative for cough, chest tightness, shortness of breath and  wheezing.   Gastrointestinal:  Negative for abdominal pain.  Skin:  Negative for rash.  Allergic/Immunologic: Positive for environmental allergies.  Neurological:  Negative for headaches.   Objective: There were no vitals taken for this visit. There is no height or weight on file to calculate BMI. Physical Exam Vitals and nursing note reviewed.  Constitutional:      Appearance: Normal appearance.  HENT:     Head: Normocephalic and  atraumatic.     Right Ear: Tympanic membrane, ear canal and external ear normal.     Left Ear: Tympanic membrane, ear canal and external ear normal.     Nose: Nose normal.     Mouth/Throat:     Mouth: Mucous membranes are moist.     Pharynx: Oropharynx is clear.  Eyes:     Extraocular Movements: Extraocular movements intact.     Conjunctiva/sclera: Conjunctivae normal.     Pupils: Pupils are equal, round, and reactive to light.  Cardiovascular:     Rate and Rhythm: Normal rate and regular rhythm.     Heart sounds: Normal heart sounds. No murmur heard.   No gallop.  Pulmonary:     Effort: Pulmonary effort is normal.     Breath sounds: Normal breath sounds. No wheezing, rhonchi or rales.  Musculoskeletal:        General: No swelling or deformity. Normal range of motion.     Cervical back: Normal range of motion and neck supple.  Skin:    General: Skin is warm and dry.     Findings: No rash.  Neurological:     Mental Status: She is alert and oriented to person, place, and time.  Psychiatric:        Mood and Affect: Mood normal.        Behavior: Behavior normal.        Thought Content: Thought content normal.        Judgment: Judgment normal.  Previous notes and tests were reviewed. The plan was reviewed with the patient/family, and all questions/concerned were addressed.  It was my pleasure to see Brittany [name] today and participate in her care. Please feel free to contact me with any questions or concerns.  Sincerely,  Rexene Alberts, DO Allergy & Immunology  [facility] office: [place] office: [phone]

## 2020-11-12 NOTE — Telephone Encounter (Signed)
Brittany [name] has given [place] a call back and I have given her the fax number for [facility] Records so she is able to fax a request from them as well. I did inform her that I have faxed the copy they sent [place] to [facility] as well, however the copy that we have received was a bit blurry. Pt is now recovering from procedure.  Miss Brittany [name] will let us know if she receives the info and if we do I will let her know as well.

## 2020-11-13 NOTE — Discharge Summary (Signed)
Physician Discharge Summary   Patient ID: Brittany [name] [id number] 55 y.o. 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 EUS could not be performed because of her roux anatomy, so a PTC was performed by IR with placement of an internal-external drain. Brushings of the biliary tree showed atypical cells but no definitive malignancy. After an extensive discussion regarding further options for workup, surgical resection was recommended as this lesion is highly suspicious for malignancy.  Hospital Course: The patient was taken to the OR on 7/25 for a Whipple procedure. Please see separately dictated operative note for further details of this procedure. Postoperatively she was admitted to the ICU with an epidural in place. POD1 her foley was removed but replaced due to urinary retention. She was advanced to a clear liquid diet, which she tolerated without nausea or vomiting. The biliary drain was internalized on POD2. Her diet was slowly advanced to a soft diet over the next few days. She had return of bowel function and was transitioned to oral pain medications. Her epidural was removed on POD3. Foley was removed on POD3 and she was able to void spontaneously. Drain amylases were low on POD3 and her first surgical JP was removed on POD4. The remaining JP was removed on POD5. On the morning of POD5 she was ambulating, pain was controlled, she was tolerated a regular diet and she was having regular bowel function. She was examined and deemed appropriate for discharge  home. Her biliary drain was left in place at discharge and will be removed in clinic.   Surgical pathology pending at time of discharge.  Consults:  None  Significant Diagnostic Studies: None  Treatments: IV hydration, analgesia: acetaminophen, Dilaudid, and oxycodone, therapies: PT and OT, and surgery: Whipple  Discharge Exam: General: resting comfortably, NAD Neuro: alert and oriented, NAD Resp: normal work of breathing on room air Abdomen: soft, nondistended, nontender. Midline incision clean and dry with no erythema or induration. RUQ PTBD capped, minimal bilious drainage around the catheter. Extremities: warm and well-perfused  Disposition: Discharge disposition: 01-Home or Self Care       Patient Instructions:  Allergies as of 11/10/2020       Reactions   Contrast Media [iodinated Diagnostic Agents] Hives   Amoxicillin Hives   .Marland KitchenHas patient had a PCN reaction causing immediate rash, facial/tongue/throat swelling, SOB or lightheadedness with hypotension: Yes Has patient had a PCN reaction causing severe rash involving mucus membranes or skin necrosis: No Has patient had a PCN reaction that required hospitalization No Has patient had a PCN reaction occurring within the last 10 years: No If all of the above answers are "NO", then may proceed with Cephalosporin use.   Bactrim [sulfamethoxazole-trimethoprim] Hives, Rash   Lamictal [lamotrigine] Hives, Rash        Medication List     STOP taking these medications    famotidine 20 MG tablet Commonly known as: PEPCID       TAKE these medications    acetaminophen 500 MG tablet Commonly known as: TYLENOL Take 500 mg by mouth 2 (two) times daily as  needed for moderate pain or headache.   calcium-vitamin D 500-200 MG-UNIT tablet Commonly known as: OSCAL WITH D Take 1 tablet by mouth every evening. For bone health   cetirizine 10 MG tablet Commonly known as: ZYRTEC Take 10 mg by mouth 2 (two) times daily.    cholecalciferol 25 MCG (1000 UNIT) tablet Commonly known as: VITAMIN D Take 1 tablet (1,000 Units total) by mouth daily. For bone health   docusate sodium 100 MG capsule Commonly known as: COLACE Take 1 capsule (100 mg total) by mouth 2 (two) times daily for 14 days.   DULoxetine 60 MG capsule Commonly known as: CYMBALTA Take 1 capsule (60 mg total) by mouth 2 (two) times daily.   EPINEPHrine 0.3 mg/0.3 mL Soaj injection Commonly known as: EpiPen 2-Pak Inject 0.3 mg into the muscle as needed for anaphylaxis.   LORazepam 0.5 MG tablet Commonly known as: Ativan Take 1 tablet (0.5 mg total) by mouth daily as needed for anxiety.   metoprolol succinate 25 MG 24 hr tablet Commonly known as: Toprol XL Take 1 tablet (25 mg total) by mouth daily. What changed: when to take this   montelukast 10 MG tablet Commonly known as: SINGULAIR TAKE 1 TABLET(10 MG) BY MOUTH AT BEDTIME What changed: See the new instructions.   Multi-Vitamin tablet Take 1 tablet by mouth daily.   oxyCODONE 5 MG immediate release tablet Commonly known as: Oxy IR/ROXICODONE Take 1 tablet (5 mg total) by mouth every 4 (four) hours as needed for severe pain.   pantoprazole 40 MG tablet Commonly known as: PROTONIX Take 1 tablet (40 mg total) by mouth daily.   QUEtiapine 50 MG tablet Commonly known as: SEROQUEL TAKE 3 TABLETS(150 MG) BY MOUTH AT BEDTIME What changed:  how much to take how to take this when to take this additional instructions   vitamin E 1000 UNIT capsule Take 1,000 Units by mouth daily.       ASK your doctor about these medications    vitamin B-12 500 MCG tablet Commonly known as: CYANOCOBALAMIN Take 1 tablet (500 mcg total) by mouth daily. For Vitamin B-12 replacement   Xolair 150 MG injection Generic drug: omalizumab INJECT 300 MG UNDER THE SKIN EVERY 14 DAYS       Activity: ambulate in house and no heavy lifting for 6 weeks Diet: regular diet Wound Care: keep wound  clean and dry, flush PTBD BID with 53m sterile saline  Follow-up with Dr. AZenia Residesin 3 weeks.  Signed: SDwan Bolt8/05/2020 8:53 AM

## 2020-11-15 ENCOUNTER — Ambulatory Visit (INDEPENDENT_AMBULATORY_CARE_PROVIDER_SITE_OTHER): Payer: 59 | Admitting: Psychology

## 2020-11-15 DIAGNOSIS — F411 Generalized anxiety disorder: Secondary | ICD-10-CM

## 2020-11-15 DIAGNOSIS — F331 Major depressive disorder, recurrent, moderate: Secondary | ICD-10-CM | POA: Diagnosis not present

## 2020-11-26 LAB — SURGICAL PATHOLOGY

## 2020-11-27 ENCOUNTER — Ambulatory Visit (INDEPENDENT_AMBULATORY_CARE_PROVIDER_SITE_OTHER): Payer: 59 | Admitting: *Deleted

## 2020-11-27 ENCOUNTER — Other Ambulatory Visit: Payer: Self-pay

## 2020-11-27 DIAGNOSIS — L501 Idiopathic urticaria: Secondary | ICD-10-CM | POA: Diagnosis not present

## 2020-11-28 ENCOUNTER — Ambulatory Visit: Payer: 59 | Admitting: Physical Therapy

## 2020-11-28 ENCOUNTER — Ambulatory Visit: Payer: 59 | Admitting: Occupational Therapy

## 2020-11-29 ENCOUNTER — Ambulatory Visit (INDEPENDENT_AMBULATORY_CARE_PROVIDER_SITE_OTHER): Payer: 59 | Admitting: Psychology

## 2020-11-29 ENCOUNTER — Other Ambulatory Visit: Payer: Self-pay

## 2020-11-29 ENCOUNTER — Telehealth (INDEPENDENT_AMBULATORY_CARE_PROVIDER_SITE_OTHER): Payer: 59 | Admitting: Psychiatry

## 2020-11-29 ENCOUNTER — Encounter (HOSPITAL_COMMUNITY): Payer: Self-pay | Admitting: Psychiatry

## 2020-11-29 DIAGNOSIS — F411 Generalized anxiety disorder: Secondary | ICD-10-CM | POA: Diagnosis not present

## 2020-11-29 DIAGNOSIS — F331 Major depressive disorder, recurrent, moderate: Secondary | ICD-10-CM

## 2020-11-29 DIAGNOSIS — F3341 Major depressive disorder, recurrent, in partial remission: Secondary | ICD-10-CM

## 2020-11-29 DIAGNOSIS — F410 Panic disorder [episodic paroxysmal anxiety] without agoraphobia: Secondary | ICD-10-CM

## 2020-11-29 DIAGNOSIS — F4310 Post-traumatic stress disorder, unspecified: Secondary | ICD-10-CM | POA: Diagnosis not present

## 2020-11-29 MED ORDER — DULOXETINE HCL 60 MG PO CPEP: 60.0000 mg | ORAL_CAPSULE | Freq: Two times a day (BID) | ORAL | 0 refills | Status: DC

## 2020-11-29 MED ORDER — QUETIAPINE FUMARATE 50 MG PO TABS: ORAL_TABLET | ORAL | 2 refills | Status: DC
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
## 2020-11-30 ENCOUNTER — Encounter: Payer: Self-pay | Admitting: Cardiology

## 2020-11-30 ENCOUNTER — Ambulatory Visit (INDEPENDENT_AMBULATORY_CARE_PROVIDER_SITE_OTHER): Payer: 59 | Admitting: Cardiology

## 2020-11-30 ENCOUNTER — Other Ambulatory Visit: Payer: Self-pay

## 2020-11-30 VITALS — BP 116/81 | HR 99 | Ht 69.0 in | Wt 228.0 lb

## 2020-11-30 DIAGNOSIS — I10 Essential (primary) hypertension: Secondary | ICD-10-CM

## 2020-11-30 DIAGNOSIS — E669 Obesity, unspecified: Secondary | ICD-10-CM | POA: Diagnosis not present

## 2020-11-30 DIAGNOSIS — G4733 Obstructive sleep apnea (adult) (pediatric): Secondary | ICD-10-CM

## 2020-11-30 NOTE — Progress Notes (Signed)
Cardiology Office Note:    Date:  11/30/2020   ID:  Brittany [name], DOB 1966-04-10, MRN [id number]  PCP:  [name], FNP  Cardiologist:  Berniece Salines, DO  Electrophysiologist:  None   Referring MD: [name],*   Chief Complaint  Patient presents with   Follow-up    History of Present Illness:    Brittany [name] is a 55 y.o. female with a hx of obesity, sleep apnea currently on CPAP, hypertension is here today for follow-up visit.  At her visit on Sep 08, 2020 we did cut back on her beta-blocker.  She tells me she is doing well.  We did get an echocardiogram for her fatigue we did not show any valvular abnormalities or any LV dysfunction.  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  She has been going to treatment for major depressive disorder.  She is doing well from a cardiovascular standpoint.   Past Medical History:  Diagnosis Date   Allergy    Angio-edema    Anxiety    Arthritis    hips, knees and hands    Blood transfusion without reported diagnosis    Depression    GERD (gastroesophageal reflux disease)    hx of    H/O hiatal hernia    History of chicken pox    Hypercholesterolemia    Hypertension    Migraine    Migraines    Morbid obesity ([place])    Positive TB test    PTSD (post-traumatic stress disorder)    Sleep apnea    no CPAP machine    Ulcer, stomach peptic    Urticaria     Past Surgical History:  Procedure Laterality Date   ABDOMINAL HYSTERECTOMY  1997 & 2006   BREATH TEK H PYLORI  05/03/2012   Procedure: BREATH TEK H PYLORI;  Surgeon: [name], MD;  Location: [name];  Service: General;  Laterality: N/A;   COLONOSCOPY     hx of benign polyps    ESOPHAGOGASTRODUODENOSCOPY N/A 09/29/2017   Procedure: ESOPHAGOGASTRODUODENOSCOPY (EGD);  Surgeon: Ladene Artist, MD;  Location: [name];  Service: Endoscopy;  Laterality: N/A;    ESOPHAGOGASTRODUODENOSCOPY N/A 09/14/2020   Procedure: ESOPHAGOGASTRODUODENOSCOPY (EGD);  Surgeon: [name], MD;  Location: [name];  Service: Endoscopy;  Laterality: N/A;   EUS N/A 09/14/2020   Procedure: UPPER ENDOSCOPIC ULTRASOUND (EUS) RADIAL;  Surgeon: [name], MD;  Location: [facility];  Service: Endoscopy;  Laterality: N/A;   EYE SURGERY     fixed a hole in the left eye   FOOT SURGERY Left    GASTRIC BYPASS     GASTRIC ROUX-EN-Y N/A 07/05/2012   Procedure: LAPAROSCOPIC ROUX-EN-Y GASTRIC;  Surgeon: [name], MD;  Location: [facility];  Service: General;  Laterality: N/A;   IR BILIARY DRAIN PLACEMENT WITH CHOLANGIOGRAM  09/08/2020   IR ENDOLUMINAL BX OF BILIARY TREE  09/20/2020   IR EXCHANGE BILIARY DRAIN  09/20/2020   IRRIGATION AND DEBRIDEMENT ABSCESS N/A 01/01/2014   Procedure: IRRIGATION AND DEBRIDEMENT ABSCESS;  Surgeon: [name], MD;  Location: [facility];  Service: General;  Laterality: N/A;   LAPAROSCOPIC LYSIS OF ADHESIONS N/A 07/05/2012   Procedure: LAPAROSCOPIC LYSIS OF ADHESIONS;  Surgeon: [name], MD;  Location: [facility];  Service: General;  Laterality: N/A;  repair of abdominal wall hernia   right tube and ovary removed  UPPER GI ENDOSCOPY N/A 07/05/2012   Procedure: UPPER GI ENDOSCOPY;  Surgeon: [name], MD;  Location: [facility];  Service: General;  Laterality: N/A;   WHIPPLE PROCEDURE N/A 11/05/2020   Procedure: WHIPPLE PROCEDURE;  Surgeon: Dwan Bolt, MD;  Location: [facility];  Service: General;  Laterality: N/A;    Current Medications: Current Meds  Medication Sig   acetaminophen (TYLENOL) 500 MG tablet Take 500 mg by mouth 2 (two) times daily as needed for moderate pain or headache.   calcium-vitamin D (OSCAL WITH D) 500-200 MG-UNIT tablet Take 1 tablet by mouth every evening. For bone health   cetirizine (ZYRTEC) 10 MG tablet Take 10 mg by mouth 2 (two) times daily.   cholecalciferol (VITAMIN D) 1000 units tablet Take  1 tablet (1,000 Units total) by mouth daily. For bone health   DULoxetine (CYMBALTA) 60 MG capsule Take 1 capsule (60 mg total) by mouth 2 (two) times daily.   EPINEPHrine (EPIPEN 2-PAK) 0.3 mg/0.3 mL IJ SOAJ injection Inject 0.3 mg into the muscle as needed for anaphylaxis.   LORazepam (ATIVAN) 0.5 MG tablet Take 1 tablet (0.5 mg total) by mouth daily as needed for anxiety.   metoprolol succinate (TOPROL XL) 25 MG 24 hr tablet Take 1 tablet (25 mg total) by mouth daily.   montelukast (SINGULAIR) 10 MG tablet TAKE 1 TABLET(10 MG) BY MOUTH AT BEDTIME   Multiple Vitamin (MULTI-VITAMIN) tablet Take 1 tablet by mouth daily.   pantoprazole (PROTONIX) 40 MG tablet Take 1 tablet (40 mg total) by mouth daily.   QUEtiapine (SEROQUEL) 50 MG tablet TAKE 3 TABLETS(150 MG) BY MOUTH AT BEDTIME   vitamin B-12 (CYANOCOBALAMIN) 500 MCG tablet Take 1 tablet (500 mcg total) by mouth daily. For Vitamin B-12 replacement   vitamin E 1000 UNIT capsule Take 1,000 Units by mouth daily.   XOLAIR 150 MG injection INJECT 300 MG UNDER THE SKIN EVERY 14 DAYS   Current Facility-Administered Medications for the 11/30/20 encounter (Office Visit) with Berniece Salines, DO  Medication   omalizumab Arvid Right) injection 300 mg     Allergies:   Contrast media [iodinated diagnostic agents], Amoxicillin, Bactrim [sulfamethoxazole-trimethoprim], and Lamictal [lamotrigine]   Social History   Socioeconomic History   Marital status: Divorced    Spouse name: Not on file   Number of children: 0   Years of education: 15   Highest education level: Not on file  Occupational History   Occupation: Software engineer   Tobacco Use   Smoking status: Former    Packs/day: 0.10    Years: 1.00    Pack years: 0.10    Types: Cigarettes    Quit date: 04/27/2004    Years since quitting: 16.6   Smokeless tobacco: Never  Vaping Use   Vaping Use: Never used  Substance and Sexual Activity   Alcohol use: Not Currently    Comment:  Occasional use/ 2 drinks a month   Drug use: No   Sexual activity: Yes    Birth control/protection: Surgical    Comment: 1st intercourse 55 yo-More than 5 partners  Other Topics Concern   Not on file  Social History Narrative   Fun: Color and walk, house renovations   Denies abuse and feels safe at home.    Social Determinants of Health   Financial Resource Strain: Not on file  Food Insecurity: Not on file  Transportation Needs: Not on file  Physical Activity: Not on file  Stress: Not on file  Social  Connections: Not on file     Family History: The patient's family history includes Alcohol abuse in her paternal uncle; Anxiety disorder in her sister; Bipolar disorder in her cousin; Bladder Cancer in her father; Breast cancer in her maternal aunt; Colon cancer in her maternal grandmother; Colon cancer (age of onset: 74) in her mother; Depression in her father and sister; Diabetes in her father and paternal grandfather; Eczema in her sister; Healthy in her maternal grandfather and paternal grandmother; Heart disease in her father and paternal grandfather; Melanoma in her maternal grandmother; Post-traumatic stress disorder in her sister; Stroke in her paternal grandfather.  ROS:   Review of Systems  Constitution: Negative for decreased appetite, fever and weight gain.  HENT: Negative for congestion, ear discharge, hoarse voice and sore throat.   Eyes: Negative for discharge, redness, vision loss in right eye and visual halos.  Cardiovascular: Negative for chest pain, dyspnea on exertion, leg swelling, orthopnea and palpitations.  Respiratory: Negative for cough, hemoptysis, shortness of breath and snoring.   Endocrine: Negative for heat intolerance and polyphagia.  Hematologic/Lymphatic: Negative for bleeding problem. Does not bruise/bleed easily.  Skin: Negative for flushing, nail changes, rash and suspicious lesions.  Musculoskeletal: Negative for arthritis, joint pain, muscle  cramps, myalgias, neck pain and stiffness.  Gastrointestinal: Negative for abdominal pain, bowel incontinence, diarrhea and excessive appetite.  Genitourinary: Negative for decreased libido, genital sores and incomplete emptying.  Neurological: Negative for brief paralysis, focal weakness, headaches and loss of balance.  Psychiatric/Behavioral: Negative for altered mental status, depression and suicidal ideas.  Allergic/Immunologic: Negative for HIV exposure and persistent infections.    EKGs/Labs/Other Studies Reviewed:    The following studies were reviewed today:   EKG: None today  Recent Labs: 08/09/2020: TSH 2.18 11/06/2020: Magnesium 1.4 11/09/2020: Hemoglobin 9.1; Platelets 262 11/10/2020: ALT 57; BUN 7; Creatinine, Ser 0.42; Potassium 3.4; Sodium 138  Recent Lipid Panel    Component Value Date/Time   CHOL 203 (H) 08/09/2020 0910   TRIG 107.0 08/09/2020 0910   HDL 65.70 08/09/2020 0910   CHOLHDL 3 08/09/2020 0910   VLDL 21.4 08/09/2020 0910   LDLCALC 116 (H) 08/09/2020 0910    Physical Exam:    VS:  BP 116/81 (BP Location: Left Arm, Patient Position: Sitting, Cuff Size: Large)   Pulse 99   Ht '5\' 9"'$  (1.753 m)   Wt 228 lb (103.4 kg)   SpO2 93%   BMI 33.67 kg/m     Wt Readings from Last 3 Encounters:  11/30/20 228 lb (103.4 kg)  11/05/20 237 lb 7 oz (107.7 kg)  11/01/20 237 lb 6.4 oz (107.7 kg)     GEN: Well nourished, well developed in no acute distress HEENT: Normal NECK: No JVD; No carotid bruits LYMPHATICS: No lymphadenopathy CARDIAC: S1S2 noted,RRR, no murmurs, rubs, gallops RESPIRATORY:  Clear to auscultation without rales, wheezing or rhonchi  ABDOMEN: Soft, non-tender, non-distended, +bowel sounds, no guarding. EXTREMITIES: No edema, No cyanosis, no clubbing MUSCULOSKELETAL:  No deformity  SKIN: Warm and dry NEUROLOGIC:  Alert and oriented x 3, non-focal PSYCHIATRIC:  Normal affect, good insight  ASSESSMENT:    1. Obesity (BMI 30-39.9)   2.  Hypertension, unspecified type   3. Obstructive sleep apnea    PLAN:    She appears to be doing well from a cardiovascular standpoint.  We will keep the patient her current dose of beta-blockers.  She is recovering well from her Whipple surgery.  She still has not seen her nutritionist we will  send a referral.  She is following up with GI as well as her surgery.  The patient is in agreement with the above plan. The patient left the office in stable condition.  The patient will follow up as needed.   Medication Adjustments/Labs and Tests Ordered: Current medicines are reviewed at length with the patient today.  Concerns regarding medicines are outlined above.  Orders Placed This Encounter  Procedures   Amb ref to Medical Nutrition Therapy-MNT   No orders of the defined types were placed in this encounter.   Patient Instructions  Medication Instructions:  Your physician recommends that you continue on your current medications as directed. Please refer to the Current Medication list given to you today.  *If you need a refill on your cardiac medications before your next appointment, please call your pharmacy*   Lab Work: None If you have labs (blood work) drawn today and your tests are completely normal, you will receive your results only by: [place] (if you have MyChart) OR A paper copy in the mail If you have any lab test that is abnormal or we need to change your treatment, we will call you to review the results.   Testing/Procedures: None   Follow-Up: At [facility], you and your health needs are our priority.  As part of our continuing mission to provide you with exceptional heart care, we have created designated Provider Care Teams.  These Care Teams include your primary Cardiologist (physician) and Advanced Practice Providers (APPs -  Physician Assistants and Nurse Practitioners) who all work together to provide you with the care you need, when you need  it.  We recommend signing up for the patient portal called "MyChart".  Sign up information is provided on this After Visit Summary.  MyChart is used to connect with patients for Virtual Visits (Telemedicine).  Patients are able to view lab/test results, encounter notes, upcoming appointments, etc.  Non-urgent messages can be sent to your provider as well.   To learn more about what you can do with MyChart, go to [email or web address].    Your next appointment:   As needed  The format for your next appointment:   In Person  Provider:   [place], DO [address], [place], [place] [address]    Other Instructions    Adopting a Healthy Lifestyle.  Know what a healthy weight is for you (roughly BMI <25) and aim to maintain this   Aim for 7+ servings of fruits and vegetables daily   65-80+ fluid ounces of water or unsweet tea for healthy kidneys   Limit to max 1 drink of alcohol per day; avoid smoking/tobacco   Limit animal fats in diet for cholesterol and heart health - choose grass fed whenever available   Avoid highly processed foods, and foods high in saturated/trans fats   Aim for low stress - take time to unwind and care for your mental health   Aim for 150 min of moderate intensity exercise weekly for heart health, and weights twice weekly for bone health   Aim for 7-9 hours of sleep daily   When it comes to diets, agreement about the perfect plan isnt easy to find, even among the experts. Experts at the [employer] developed an idea known as the Healthy Eating Plate. Just imagine a plate divided into logical, healthy portions.   The emphasis is on diet quality:   Load up on vegetables and fruits - one-half  of your plate: Aim for color and variety, and remember that potatoes dont count.   Go for whole grains - one-quarter of your plate: Whole wheat, barley, wheat berries, quinoa, oats, brown rice, and foods made with them.  If you want pasta, go with whole wheat pasta.   Protein power - one-quarter of your plate: Fish, chicken, beans, and nuts are all healthy, versatile protein sources. Limit red meat.   The diet, however, does go beyond the plate, offering a few other suggestions.   Use healthy plant oils, such as olive, canola, soy, corn, sunflower and peanut. Check the labels, and avoid partially hydrogenated oil, which have unhealthy trans fats.   If youre thirsty, drink water. Coffee and tea are good in moderation, but skip sugary drinks and limit milk and dairy products to one or two daily servings.   The type of carbohydrate in the diet is more important than the amount. Some sources of carbohydrates, such as vegetables, fruits, whole grains, and beans-are healthier than others.   Finally, stay active  Signed, Berniece Salines, DO  11/30/2020 9:12 AM    [place]

## 2020-11-30 NOTE — Patient Instructions (Signed)
Medication Instructions:  Your physician recommends that you continue on your current medications as directed. Please refer to the Current Medication list given to you today.  *If you need a refill on your cardiac medications before your next appointment, please call your pharmacy*   Lab Work: None If you have labs (blood work) drawn today and your tests are completely normal, you will receive your results only by: [place] (if you have MyChart) OR A paper copy in the mail If you have any lab test that is abnormal or we need to change your treatment, we will call you to review the results.   Testing/Procedures: None   Follow-Up: At [facility], you and your health needs are our priority.  As part of our continuing mission to provide you with exceptional heart care, we have created designated Provider Care Teams.  These Care Teams include your primary Cardiologist (physician) and Advanced Practice Providers (APPs -  Physician Assistants and Nurse Practitioners) who all work together to provide you with the care you need, when you need it.  We recommend signing up for the patient portal called "MyChart".  Sign up information is provided on this After Visit Summary.  MyChart is used to connect with patients for Virtual Visits (Telemedicine).  Patients are able to view lab/test results, encounter notes, upcoming appointments, etc.  Non-urgent messages can be sent to your provider as well.   To learn more about what you can do with MyChart, go to [email or web address].    Your next appointment:   As needed  The format for your next appointment:   In Person  Provider:   Mart[place], DO [address], [place], [place] [address]    Other Instructions

## 2020-12-05 ENCOUNTER — Telehealth: Payer: Self-pay

## 2020-12-05 ENCOUNTER — Other Ambulatory Visit: Payer: Self-pay

## 2020-12-05 NOTE — Telephone Encounter (Signed)
Left message for patient to call back  

## 2020-12-05 NOTE — Progress Notes (Signed)
The proposed treatment discussed in conference is for discussion purpose only and is not a binding recommendation.  The patients have not been physically examined, or presented with their treatment options.  Therefore, final treatment plans cannot be decided.  

## 2020-12-05 NOTE — Telephone Encounter (Signed)
-----   Message from Ladene Artist, MD sent at 12/04/2020  2:36 PM EDT ----- Barbera Setters please arrange an office appt with me for follow up. Thx. MS  ----- Message ----- From: [name], MD Sent: 12/04/2020   1:01 PM EDT To: Ladene Artist, MD, Dwan Bolt, MD  [facility], Well that is definitely great for her.  This is certainly described in our literature however I personally have never seen a patient with it.  I am going to forward this to Lucio Edward who is her primary gastroenterologist about arranging follow-up care.  Thanks  Norberto Sorenson, See above.     ----- Message ----- From: Dwan Bolt, MD Sent: 12/04/2020  12:18 PM EDT To: [name], MD  Linna Hoff, Ms. 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 There was no malignancy. I was not expecting this. Have you seen many patients with autoimmune pancreatitis or would you be willing to see her for this? Should she receive any steroid treatment to prevent recurrent symptoms in her remnant pancreas? I am planning to discuss her case at [facility] as well. Thanks for your input. Wilburn Cornelia

## 2020-12-11 NOTE — Telephone Encounter (Signed)
Left message for patient to call back  

## 2020-12-13 NOTE — Telephone Encounter (Signed)
Left message for patient to call back and schedule a follow up office visit with Dr. Fuller Plan

## 2020-12-13 NOTE — Telephone Encounter (Signed)
Scheduled patient for 10/6\22.

## 2020-12-20 ENCOUNTER — Ambulatory Visit (INDEPENDENT_AMBULATORY_CARE_PROVIDER_SITE_OTHER): Payer: 59 | Admitting: Psychology

## 2020-12-20 DIAGNOSIS — F331 Major depressive disorder, recurrent, moderate: Secondary | ICD-10-CM | POA: Diagnosis not present

## 2020-12-20 DIAGNOSIS — F411 Generalized anxiety disorder: Secondary | ICD-10-CM

## 2020-12-25 ENCOUNTER — Ambulatory Visit (INDEPENDENT_AMBULATORY_CARE_PROVIDER_SITE_OTHER): Payer: 59 | Admitting: *Deleted

## 2020-12-25 ENCOUNTER — Other Ambulatory Visit: Payer: Self-pay

## 2020-12-25 DIAGNOSIS — L501 Idiopathic urticaria: Secondary | ICD-10-CM | POA: Diagnosis not present

## 2020-12-27 DIAGNOSIS — K861 Other chronic pancreatitis: Secondary | ICD-10-CM | POA: Insufficient documentation

## 2020-12-27 DIAGNOSIS — R5383 Other fatigue: Secondary | ICD-10-CM | POA: Insufficient documentation

## 2021-01-08 ENCOUNTER — Ambulatory Visit (INDEPENDENT_AMBULATORY_CARE_PROVIDER_SITE_OTHER): Payer: 59 | Admitting: Psychology

## 2021-01-08 DIAGNOSIS — F331 Major depressive disorder, recurrent, moderate: Secondary | ICD-10-CM

## 2021-01-08 DIAGNOSIS — F411 Generalized anxiety disorder: Secondary | ICD-10-CM

## 2021-01-15 ENCOUNTER — Other Ambulatory Visit: Payer: Self-pay

## 2021-01-15 ENCOUNTER — Encounter: Payer: Self-pay | Admitting: Internal Medicine

## 2021-01-15 ENCOUNTER — Ambulatory Visit (INDEPENDENT_AMBULATORY_CARE_PROVIDER_SITE_OTHER): Payer: 59 | Admitting: Internal Medicine

## 2021-01-15 VITALS — BP 75/46 | HR 84 | Resp 16 | Ht 68.5 in | Wt 218.2 lb

## 2021-01-15 DIAGNOSIS — R682 Dry mouth, unspecified: Secondary | ICD-10-CM | POA: Diagnosis not present

## 2021-01-15 DIAGNOSIS — K861 Other chronic pancreatitis: Secondary | ICD-10-CM | POA: Diagnosis not present

## 2021-01-15 DIAGNOSIS — R29898 Other symptoms and signs involving the musculoskeletal system: Secondary | ICD-10-CM | POA: Diagnosis not present

## 2021-01-15 DIAGNOSIS — L501 Idiopathic urticaria: Secondary | ICD-10-CM

## 2021-01-15 NOTE — Progress Notes (Signed)
Office Visit Note  Patient: Brittany [name]             Date of Birth: [birth year]           MRN: [id number]             PCP: [name], FNP Referring: Dwan Bolt, MD Visit Date: 01/15/2021   Subjective:  New Patient (Initial Visit) (Whipple procedure - pancreas didn't look normal)   History of Present Illness: Brittany [name] is a 55 y.o. 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  She thinks current problems started in around the past year with generally feeling unwell chronic fatigue and was having ongoing unintentional weight loss of about 50-60 pounds ongoing during that time.  She noticed occasional episodes of vomiting or diarrhea so did not tolerate abnormal diet for her.  However no specific abdominal pain complaints.  She went for a visit with her primary care doctor lab work showed abnormal liver function tests.  This led to abdominal ultrasound with abnormal findings leading to abdominal MRI with MRCP in May.  Mid common bile duct stricture was visualized no specific pancreatic mass but suspicion for periductal lesion she was not a candidate for endoscopic ultrasound due to history of Roux-en-Y gastric bypass surgery.  She went for subsequent biliary drain placement. Due to some atypical cells seen, elevated CA 19-9 level, and suspicious imaging she underwent whipple procedure on 7/25. Body and head of the pancreas were noted to be somewhat atrophic in procedural note. Pathology was negative for malignancy but consistent with autoimmune sclerosing pancreatitis. Since surgery her appetite is very poor limiting oral intake. She has noticed a large amount of leg swelling and rapidly worsened strength in her legs, now unable to climb stairs or get up from the floor unaided. She also notices dry mouth since her surgery. She denies any  new skin rashes, no swollen nodules or glands. No particular wound healing complications or ongoing abdominal pain. She has a follow up appointment scheduled to see [facility] with Dr. Fuller Plan later this week.  Labs reviewed 10/2020 Pathology - Pancreatic parenchyma showing fibrosis and chronic inflammation with plasma cells, suggestive of IgG4-related autoimmnune pancreatitis.  Activities of Daily Living:  Patient reports morning stiffness for 15 minutes.   Patient Denies nocturnal pain.  Difficulty dressing/grooming: Reports Difficulty climbing stairs: Reports Difficulty getting out of chair: Reports Difficulty using hands for taps, buttons, cutlery, and/or writing: Denies  Review of Systems  Constitutional:  Positive for fatigue.  HENT:  Positive for mouth dryness.   Eyes:  Negative for dryness.  Respiratory:  Negative for shortness of breath.   Cardiovascular:  Positive for swelling in legs/feet.  Gastrointestinal:  Negative for constipation.  Endocrine: Positive for cold intolerance.  Genitourinary:  Negative for difficulty urinating.  Musculoskeletal:  Positive for joint pain, gait problem, joint pain, joint swelling, muscle weakness and morning stiffness.  Skin:  Negative for rash.  Allergic/Immunologic: Negative for susceptible to infections.  Neurological:  Positive for numbness and weakness.  Hematological:  Negative for bruising/bleeding tendency.  Psychiatric/Behavioral:  Negative for sleep disturbance.    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/27/2020   Fatigue 12/27/2020   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  Abdominal pain 09/13/2020   Biliary obstruction 09/12/2020   Obstructive jaundice 09/08/2020   Elevated LFTs 09/08/2020   Choledocholithiasis 09/07/2020   Urticaria    Ulcer, stomach peptic    Sleep apnea    Positive TB test     Migraines    Hypercholesterolemia    History of chicken pox    H/O hiatal hernia    Blood transfusion without reported diagnosis    Arthritis    Anxiety    Angio-edema    Allergy    Allergic contact dermatitis 07/09/2020   Chronic idiopathic urticaria 01/17/2019   Seasonal and perennial allergic rhinoconjunctivitis 11/08/2018   Allergic reaction 11/08/2018   Diarrhea 11/08/2018   Right thigh pain 05/19/2018   Melena    Acute gastrojejunal anastomotic ulcer    Macrocytic anemia    Acute GI bleeding 09/28/2017   Epigastric abdominal pain 11/19/2016   Incisional hernia with obstruction but no gangrene 07/21/2016   Menopause 04/22/2016   Routine general medical examination at a health care facility 03/18/2016   Obesity 03/18/2016   Severe episode of recurrent major depressive disorder, without psychotic features ([place])    Depression, major, severe recurrence ([place]) 09/11/2015   Major depressive disorder, recurrent, severe without psychotic features ([place])    MDD (major depressive disorder) 10/30/2014   MDD (major depressive disorder), recurrent severe, without psychosis ([place])    Suicidal ideation 10/05/2014   Depression 06/12/2014   Rash 01/04/2014   Normocytic anemia 01/04/2014   DJD (degenerative joint disease) 01/04/2014   Hypertension 01/04/2014   Dyslipidemia 01/04/2014   GERD (gastroesophageal reflux disease) 01/04/2014   Obstructive sleep apnea 01/04/2014   Status post small bowel resection 01/04/2014   Abdominal abscess 01/01/2014   Wound infection after surgery 01/01/2014   PTSD (post-traumatic stress disorder) 11/23/2013   Severe recurrent major depression without psychotic features ([place]) 11/22/2013   History of Roux-en-Y gastric bypass, 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 11/11/2012   Morbid obesity, Weight - 312, BMI - 45.2 04/21/2012    Past Medical History:  Diagnosis Date   Allergy    Angio-edema    Anxiety    Arthritis    hips, knees and hands    Blood transfusion without  reported diagnosis    Depression    GERD (gastroesophageal reflux disease)    hx of    H/O hiatal hernia    History of chicken pox    Hypercholesterolemia    Hypertension    Migraine    Migraines    Morbid obesity ([place])    Pancreatitis due to biliary obstruction    Positive TB test    PTSD (post-traumatic stress disorder)    Sleep apnea    no CPAP machine    Ulcer, stomach peptic    Urticaria     Family History  Problem Relation Age of Onset   Colon cancer Mother 78   Graves' disease Mother    Cancer Father    Depression Father    Diabetes Father    Heart disease Father    Bladder Cancer Father    Heart Problems Father    Depression Sister    Post-traumatic stress disorder Sister    Anxiety disorder Sister    Eczema Sister    Breast cancer Maternal Aunt    Alcohol abuse Paternal Uncle    Melanoma Maternal Grandmother    Colon cancer Maternal Grandmother        does not know age of onset   Healthy Maternal Grandfather    Healthy  Paternal Grandmother    Heart disease Paternal Grandfather    Stroke Paternal Grandfather    Diabetes Paternal Grandfather    Bipolar disorder Cousin    Past Surgical History:  Procedure Laterality Date   ABDOMINAL HYSTERECTOMY  1997 & 2006   BREATH TEK H PYLORI  05/03/2012   Procedure: BREATH TEK Kandis Ban;  Surgeon: [name], MD;  Location: [name];  Service: General;  Laterality: N/A;   COLONOSCOPY     hx of benign polyps    ESOPHAGOGASTRODUODENOSCOPY N/A 09/29/2017   Procedure: ESOPHAGOGASTRODUODENOSCOPY (EGD);  Surgeon: Ladene Artist, MD;  Location: [name];  Service: Endoscopy;  Laterality: N/A;   ESOPHAGOGASTRODUODENOSCOPY N/A 09/14/2020   Procedure: ESOPHAGOGASTRODUODENOSCOPY (EGD);  Surgeon: [name], MD;  Location: [name];  Service: Endoscopy;  Laterality: N/A;   EUS N/A 09/14/2020   Procedure: UPPER ENDOSCOPIC ULTRASOUND (EUS) RADIAL;  Surgeon: [name], MD;  Location: [facility];   Service: Endoscopy;  Laterality: N/A;   EYE SURGERY     fixed a hole in the left eye   FOOT SURGERY Left    GASTRIC BYPASS     GASTRIC ROUX-EN-Y N/A 07/05/2012   Procedure: LAPAROSCOPIC ROUX-EN-Y GASTRIC;  Surgeon: [name], MD;  Location: [facility];  Service: General;  Laterality: N/A;   IR BILIARY DRAIN PLACEMENT WITH CHOLANGIOGRAM  09/08/2020   IR ENDOLUMINAL BX OF BILIARY TREE  09/20/2020   IR EXCHANGE BILIARY DRAIN  09/20/2020   IRRIGATION AND DEBRIDEMENT ABSCESS N/A 01/01/2014   Procedure: IRRIGATION AND DEBRIDEMENT ABSCESS;  Surgeon: [name], MD;  Location: [facility];  Service: General;  Laterality: N/A;   LAPAROSCOPIC LYSIS OF ADHESIONS N/A 07/05/2012   Procedure: LAPAROSCOPIC LYSIS OF ADHESIONS;  Surgeon: [name], MD;  Location: [facility];  Service: General;  Laterality: N/A;  repair of abdominal wall hernia   right tube and ovary removed      UPPER GI ENDOSCOPY N/A 07/05/2012   Procedure: UPPER GI ENDOSCOPY;  Surgeon: [name], MD;  Location: [facility];  Service: General;  Laterality: N/A;   WHIPPLE PROCEDURE N/A 11/05/2020   Procedure: WHIPPLE PROCEDURE;  Surgeon: Dwan Bolt, MD;  Location: [facility];  Service: General;  Laterality: N/A;   Social History   Social History Narrative   Fun: Color and walk, house renovations   Denies abuse and feels safe at home.    Immunization History  Administered Date(s) Administered   Influenza,inj,Quad PF,6+ Mos 01/09/2014, 01/13/2019   Influenza-Unspecified 01/14/2018, 01/13/2019   PFIZER(Purple Top)SARS-COV-2 Vaccination 07/14/2019, 08/08/2019, 03/30/2020   Tdap 12/28/2010     Objective: Vital Signs: BP (!) 75/46 (BP Location: Right Arm, Patient Position: Sitting, Cuff Size: Normal)   Pulse 84   Resp 16   Ht 5' 8.5" (1.74 m)   Wt 218 lb 3.2 oz (99 kg)   BMI 32.69 kg/m    Physical Exam Constitutional:      Appearance: She is obese.  HENT:     Head:     Comments: No palpable salivary gland swelling     Mouth/Throat:     Mouth: Mucous membranes are dry.     Comments: No visible salivary pooling No loss of lingual papillae, no ulcers or lesions Cardiovascular:     Rate and Rhythm: Normal rate and regular rhythm.  Pulmonary:     Effort: Pulmonary effort is normal.     Breath sounds: Normal breath sounds.  Musculoskeletal:     Right lower leg:  Edema present.     Left lower leg: Edema present.     Comments: 2+ pitting edema bilaterally, extends over halfway to knees mild skin changes  Skin:    General: Skin is warm and dry.     Findings: No rash.  Neurological:     Mental Status: She is alert.     Deep Tendon Reflexes: Reflexes normal.     Comments: Upper extremity strength 5/5 Hip flexion/extension 3/5 Knee flexion/extension 4/5 Ankle flexion/extension 5/5  Psychiatric:        Mood and Affect: Mood normal.     Musculoskeletal Exam:  Shoulders full ROM no tenderness or swelling Elbows full ROM no tenderness or swelling Wrists full ROM no tenderness or swelling Fingers full ROM no tenderness or swelling Knees full ROM no tenderness or swelling   Investigation: No additional findings.  Imaging: No results found.  Recent Labs: Lab Results  Component Value Date   WBC 4.1 01/15/2021   HGB 11.9 01/15/2021   PLT 270 01/15/2021   NA 139 01/15/2021   K 4.2 01/15/2021   CL 104 01/15/2021   CO2 26 01/15/2021   GLUCOSE 108 (H) 01/15/2021   BUN 10 01/15/2021   CREATININE 0.48 (L) 01/15/2021   BILITOT 1.0 01/15/2021   ALKPHOS 168 (H) 11/10/2020   AST 46 (H) 01/15/2021   ALT 82 (H) 01/15/2021   PROT 5.7 (L) 01/15/2021   ALBUMIN 1.6 (L) 11/10/2020   CALCIUM 8.0 (L) 01/15/2021   GFRAA 115 05/24/2018    Speciality Comments: No specialty comments available.  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  Not clear currently whether there is other systemic involvement.  Her abdominal MRI imaging was not suggestive for retroperitoneal fibrosis changes.  Worsening oral dryness with decreased salivary production but no appreciable parotid or submandibular gland swelling at this time.  No eye involvement.  Renal function remained stable.  We will check serum IgG subclass 4 level and quantitative immunoglobulins.  Checking amylase and lipase for baseline.  Try to figure out degree of current disease activity systemically or in the pancreas, initial treatment most often oral methylprednisolone.  Chronic idiopathic urticaria  History of idiopathic urticaria predating these current episodes not clear whether is any specific association at this time.  Not currently active only skin changes on lower extremities related to severe edema.  Bilateral leg weakness - Plan: CK  Significant weakness with decreased mobility and independent activity.  Not typical for myositis involvement with only involving the lower extremities.  Could be related to very limited oral intake low protein and postoperative recovery deconditioning.  Also with severe pitting edema of bilateral lower extremities.  Checking creatinine kinase if this is abnormal additional work-up with myositis related antibodies.  Dry mouth  Decreased salivary pooling noted on exam although no specific changes of chronic xerostomia such as ulceration poor dentition or loss of lingual papillae.  Orders: Orders Placed This Encounter  Procedures   CK   CBC with Differential/Platelet   COMPLETE METABOLIC PANEL WITH GFR   IGG SUBCLASS 4   IgG, IgA, IgM    Lipase   Amylase  Amylase   Lipase    No orders of the defined types were placed in this encounter.    Follow-Up Instructions: Return in about 2 weeks (around 01/29/2021) for New pt IgG4-RD/AIP f/u 2wks.   Collier Salina, MD  Note - This record has been created using [employer].  Chart creation errors have been sought, but may not always  have been located. Such creation errors do not reflect on  the standard of medical care.

## 2021-01-17 ENCOUNTER — Encounter: Payer: Self-pay | Admitting: Gastroenterology

## 2021-01-17 ENCOUNTER — Encounter: Payer: Self-pay | Admitting: Family

## 2021-01-17 ENCOUNTER — Ambulatory Visit (INDEPENDENT_AMBULATORY_CARE_PROVIDER_SITE_OTHER): Payer: 59 | Admitting: Gastroenterology

## 2021-01-17 VITALS — HR 96 | Ht 68.5 in | Wt 216.2 lb

## 2021-01-17 DIAGNOSIS — K861 Other chronic pancreatitis: Secondary | ICD-10-CM | POA: Diagnosis not present

## 2021-01-17 DIAGNOSIS — R6881 Early satiety: Secondary | ICD-10-CM | POA: Diagnosis not present

## 2021-01-17 DIAGNOSIS — R634 Abnormal weight loss: Secondary | ICD-10-CM | POA: Diagnosis not present

## 2021-01-17 NOTE — Progress Notes (Signed)
    History of Present Illness: This is a 55 year old female who underwent a Roux-en-Y gastric bypass in 2014 and a Whipple on November 05, 2020 for a biliary stricture that was suspicious for malignancy. Pathology c/w autoimmune pancreatitis. She relates early satiety, poor appetite, dry mouth, weight loss, generalized weakness and bilateral leg weakness. Her BP was 75/46 yesterday at Dr. Marveen Reeks office. Denies abdominal pain, constipation, diarrhea, change in stool caliber, melena, hematochezia, nausea, vomiting, dysphagia, reflux symptoms, chest pain.   Current Medications, Allergies, Past Medical History, Past Surgical History, Family History and Social History were reviewed in [employer] record.   Physical Exam: General: Well developed, well nourished, no acute distress Head: Normocephalic and atraumatic Eyes: Sclerae anicteric, EOMI Ears: Normal auditory acuity Mouth: Not examined, mask on during Covid-19 pandemic Lungs: Clear throughout to auscultation Heart: Regular rate and rhythm; no murmurs, rubs or bruits Abdomen: Soft, non tender and non distended. No masses, hepatosplenomegaly or hernias noted. Normal Bowel sounds Rectal: Not done Musculoskeletal: Symmetrical with no gross deformities  Pulses:  Normal pulses noted Extremities: No clubbing, cyanosis, edema or deformities noted Neurological: Alert oriented x 4, grossly nonfocal Psychological:  Alert and cooperative. 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  S/P Roux-en-Y gastric bypass in 2014. Worsening problems with early satiety, poor appetite and weight loss since her surgery.  Rule out ulcer, anastomotic stenosis, delayed gastric emptying.  IgG4 is pending. Continue pantoprazole 40 mg daily.  Multiple small soft and liquid meals.  Schedule EGD when blood pressure improves, see #2.  The risks (including bleeding,  perforation, infection, missed lesions, medication reactions and possible hospitalization or surgery if complications occur), benefits, and alternatives to endoscopy with possible biopsy and possible dilation were discussed with the patient and they consent to proceed.  The day prior to EGD she is advised to have a light, low residue breakfast and then clear liquids until n.p.o. for EGD.  Pending findings on EGD a CT AP or UGI series may be indicated.  Discussed treatment of autoimmune pancreatitis and possible referral to a tertiary center for mgmt.  Hypotension, generalized weakness, bilateral leg weakness and dry mouth. She is advised to discontinue metoprolol and to contact her PCP today for further evaluation and management.  She is advised to follow-up with her rheumatologist as recommended.

## 2021-01-17 NOTE — Patient Instructions (Signed)
Stop taking your metoprolol.  Call your primary care physician regarding your blood pressure.   You have been scheduled for an endoscopy. Please follow written instructions given to you at your visit today. If you use inhalers (even only as needed), please bring them with you on the day of your procedure.  The [facility] providers would like to encourage you to use [facility] to communicate with providers for non-urgent requests or questions.  Due to long hold times on the telephone, sending your provider a message by [facility] may be a faster and more efficient way to get a response.  Please allow 48 business hours for a response.  Please remember that this is for non-urgent requests.   Due to recent changes in healthcare laws, you may see the results of your imaging and laboratory studies on MyChart before your provider has had a chance to review them.  We understand that in some cases there may be results that are confusing or concerning to you. Not all laboratory results come back in the same time frame and the provider may be waiting for multiple results in order to interpret others.  Please give [place] 48 hours in order for your provider to thoroughly review all the results before contacting the office for clarification of your results.   Normal BMI (Body Mass Index- based on height and weight) is between 19 and 25. Your BMI today is Body mass index is 32.4 kg/m. Marland Kitchen Please consider follow up  regarding your BMI with your Primary Care Provider.  Thank you for choosing me and [facility].  Pricilla Riffle. Dagoberto Ligas., MD., Marval Regal

## 2021-01-19 LAB — CBC WITH DIFFERENTIAL/PLATELET
Absolute Monocytes: 332 cells/uL (ref 200–950)
Basophils Absolute: 8 cells/uL (ref 0–200)
Basophils Relative: 0.2 %
Eosinophils Absolute: 49 cells/uL (ref 15–500)
Eosinophils Relative: 1.2 %
HCT: 35.8 % (ref 35.0–45.0)
Hemoglobin: 11.9 g/dL (ref 11.7–15.5)
Lymphs Abs: 1603 cells/uL (ref 850–3900)
MCH: 33.8 pg — ABNORMAL HIGH (ref 27.0–33.0)
MCHC: 33.2 g/dL (ref 32.0–36.0)
MCV: 101.7 fL — ABNORMAL HIGH (ref 80.0–100.0)
MPV: 11.7 fL (ref 7.5–12.5)
Monocytes Relative: 8.1 %
Neutro Abs: 2107 cells/uL (ref 1500–7800)
Neutrophils Relative %: 51.4 %
Platelets: 270 10*3/uL (ref 140–400)
RBC: 3.52 10*6/uL — ABNORMAL LOW (ref 3.80–5.10)
RDW: 12.8 % (ref 11.0–15.0)
Total Lymphocyte: 39.1 %
WBC: 4.1 10*3/uL (ref 3.8–10.8)

## 2021-01-19 LAB — COMPLETE METABOLIC PANEL WITH GFR
AG Ratio: 0.7 (calc) — ABNORMAL LOW (ref 1.0–2.5)
ALT: 82 U/L — ABNORMAL HIGH (ref 6–29)
AST: 46 U/L — ABNORMAL HIGH (ref 10–35)
Albumin: 2.3 g/dL — ABNORMAL LOW (ref 3.6–5.1)
Alkaline phosphatase (APISO): 182 U/L — ABNORMAL HIGH (ref 37–153)
BUN/Creatinine Ratio: 21 (calc) (ref 6–22)
BUN: 10 mg/dL (ref 7–25)
CO2: 26 mmol/L (ref 20–32)
Calcium: 8 mg/dL — ABNORMAL LOW (ref 8.6–10.4)
Chloride: 104 mmol/L (ref 98–110)
Creat: 0.48 mg/dL — ABNORMAL LOW (ref 0.50–1.03)
Globulin: 3.4 g/dL (calc) (ref 1.9–3.7)
Glucose, Bld: 108 mg/dL — ABNORMAL HIGH (ref 65–99)
Potassium: 4.2 mmol/L (ref 3.5–5.3)
Sodium: 139 mmol/L (ref 135–146)
Total Bilirubin: 1 mg/dL (ref 0.2–1.2)
Total Protein: 5.7 g/dL — ABNORMAL LOW (ref 6.1–8.1)
eGFR: 111 mL/min/{1.73_m2} (ref >=60)

## 2021-01-19 LAB — IGG, IGA, IGM
IgG (Immunoglobin G), Serum: 2105 mg/dL — ABNORMAL HIGH (ref 600–1640)
IgM, Serum: 92 mg/dL (ref 50–300)
Immunoglobulin A: 417 mg/dL — ABNORMAL HIGH (ref 47–310)

## 2021-01-19 LAB — CK: Total CK: 33 U/L (ref 29–143)

## 2021-01-19 LAB — AMYLASE: Amylase: <10 U/L — ABNORMAL LOW (ref 21–101)

## 2021-01-19 LAB — IGG SUBCLASS 4: IgG Subclass 4: 194.9 mg/dL — ABNORMAL HIGH (ref 4.0–86.0)

## 2021-01-19 LAB — LIPASE: Lipase: <5 U/L — ABNORMAL LOW (ref 7–60)

## 2021-01-22 ENCOUNTER — Ambulatory Visit: Payer: 59

## 2021-01-22 ENCOUNTER — Ambulatory Visit (INDEPENDENT_AMBULATORY_CARE_PROVIDER_SITE_OTHER): Payer: 59 | Admitting: Family

## 2021-01-22 ENCOUNTER — Other Ambulatory Visit: Payer: Self-pay

## 2021-01-22 VITALS — BP 110/80 | HR 99 | Temp 98.1°F | Ht 69.0 in | Wt 216.4 lb

## 2021-01-22 DIAGNOSIS — R5383 Other fatigue: Secondary | ICD-10-CM | POA: Diagnosis not present

## 2021-01-22 NOTE — Progress Notes (Signed)
Brittany [name] is a 55 y.o. 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   Fatigue 12/27/2020   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   Abdominal pain 09/13/2020   Biliary obstruction 09/12/2020   Obstructive jaundice 09/08/2020   Elevated LFTs 09/08/2020   Choledocholithiasis 09/07/2020   Urticaria    Ulcer, stomach peptic    Sleep apnea    Positive TB test    Migraines    Hypercholesterolemia    History of chicken pox    H/O hiatal hernia    Blood transfusion without reported diagnosis    Arthritis    Anxiety    Angio-edema    Allergy    Allergic contact dermatitis 07/09/2020   Chronic idiopathic urticaria 01/17/2019   Seasonal and perennial allergic rhinoconjunctivitis 11/08/2018   Allergic reaction 11/08/2018   Diarrhea 11/08/2018   Right thigh pain 05/19/2018   Melena    Acute gastrojejunal anastomotic ulcer    Macrocytic anemia    Acute GI bleeding 09/28/2017   Epigastric abdominal pain 11/19/2016   Incisional hernia with obstruction but no gangrene 07/21/2016   Menopause 04/22/2016   Routine general medical examination at a health care facility 03/18/2016   Obesity 03/18/2016   Severe episode of recurrent major depressive disorder, without psychotic features ([place])    Depression, major, severe recurrence ([place]) 09/11/2015   Major depressive disorder, recurrent, severe without psychotic features ([place])    MDD (major depressive disorder) 10/30/2014   MDD (major depressive disorder), recurrent severe, without psychosis ([place])    Suicidal ideation 10/05/2014   Depression 06/12/2014   Rash 01/04/2014   Normocytic anemia 01/04/2014   DJD (degenerative joint disease) 01/04/2014   Hypertension 01/04/2014   Dyslipidemia 01/04/2014   GERD (gastroesophageal reflux disease) 01/04/2014   Obstructive sleep  apnea 01/04/2014   Status post small bowel resection 01/04/2014   Abdominal abscess 01/01/2014   Wound infection after surgery 01/01/2014   PTSD (post-traumatic stress disorder) 11/23/2013   Severe recurrent major depression without psychotic features ([place]) 11/22/2013   History of Roux-en-Y gastric bypass, 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 11/11/2012   Morbid obesity, Weight - 312, BMI - 45.2 04/21/2012    Current Outpatient Medications  Medication Sig Dispense Refill   acetaminophen (TYLENOL) 500 MG tablet Take 500 mg by mouth 2 (two) times daily as needed for moderate pain or headache.     calcium-vitamin D (OSCAL WITH D) 500-200 MG-UNIT tablet Take 1 tablet by mouth every evening. For bone health     cetirizine (ZYRTEC) 10 MG tablet Take 10 mg by mouth 2 (two) times daily.     cholecalciferol (VITAMIN D) 1000 units tablet Take 1 tablet (1,000 Units total) by mouth daily. For bone health     DULoxetine (CYMBALTA) 60 MG capsule Take 1 capsule (60 mg total) by mouth 2 (two) times daily. 180 capsule 0   EPINEPHrine (EPIPEN 2-PAK) 0.3 mg/0.3 mL IJ SOAJ injection Inject 0.3 mg into the muscle as needed for anaphylaxis. 0.3 mL 1   LORazepam (ATIVAN) 0.5 MG tablet Take 1 tablet (0.5 mg total) by mouth daily as needed for anxiety. 15 tablet 0   montelukast (SINGULAIR) 10 MG tablet TAKE 1 TABLET(10 MG) BY MOUTH AT BEDTIME 90 tablet 0   Multiple Vitamin (MULTI-VITAMIN) tablet Take 1 tablet by mouth daily.     pantoprazole (PROTONIX) 40 MG tablet  Take 1 tablet (40 mg total) by mouth daily. 30 tablet 11   QUEtiapine (SEROQUEL) 50 MG tablet TAKE 3 TABLETS(150 MG) BY MOUTH AT BEDTIME 90 tablet 2   vitamin B-12 (CYANOCOBALAMIN) 500 MCG tablet Take 1 tablet (500 mcg total) by mouth daily. For Vitamin B-12 replacement     vitamin E 1000 UNIT capsule Take 1,000 Units by mouth daily.     XOLAIR 150 MG injection INJECT 300 MG UNDER THE SKIN EVERY 14 DAYS 4 each 11   Current Facility-Administered Medications  Medication Dose Route  Frequency Provider Last Rate Last Admin   omalizumab Arvid Right) injection 300 mg  300 mg Subcutaneous Q28 days Garnet Sierras, DO   300 mg at 12/25/20 6333    Allergies: Contrast media [iodinated diagnostic agents], Amoxicillin, Bactrim [sulfamethoxazole-trimethoprim], and Lamictal [lamotrigine]  Past Medical History:  Diagnosis Date   Allergy    Angio-edema    Anxiety    Arthritis    hips, knees and hands    Blood transfusion without reported diagnosis    Depression    GERD (gastroesophageal reflux disease)    hx of    H/O hiatal hernia    History of chicken pox    Hypercholesterolemia    Hypertension    Migraine    Migraines    Morbid obesity ([place])    Pancreatitis due to biliary obstruction    Positive TB test    PTSD (post-traumatic stress disorder)    Sleep apnea    no CPAP machine    Ulcer, stomach peptic    Urticaria     Past Surgical History:  Procedure Laterality Date   ABDOMINAL HYSTERECTOMY  1997 & 2006   BREATH TEK H PYLORI  05/03/2012   Procedure: BREATH TEK H PYLORI;  Surgeon: [name], MD;  Location: [name];  Service: General;  Laterality: N/A;   COLONOSCOPY     hx of benign polyps    ESOPHAGOGASTRODUODENOSCOPY N/A 09/29/2017   Procedure: ESOPHAGOGASTRODUODENOSCOPY (EGD);  Surgeon: Ladene Artist, MD;  Location: [name];  Service: Endoscopy;  Laterality: N/A;   ESOPHAGOGASTRODUODENOSCOPY N/A 09/14/2020   Procedure: ESOPHAGOGASTRODUODENOSCOPY (EGD);  Surgeon: [name], MD;  Location: [name];  Service: Endoscopy;  Laterality: N/A;   EUS N/A 09/14/2020   Procedure: UPPER ENDOSCOPIC ULTRASOUND (EUS) RADIAL;  Surgeon: [name], MD;  Location: [facility];  Service: Endoscopy;  Laterality: N/A;   EYE SURGERY     fixed a hole in the left eye   FOOT SURGERY Left    GASTRIC BYPASS     GASTRIC ROUX-EN-Y N/A 07/05/2012   Procedure: LAPAROSCOPIC ROUX-EN-Y GASTRIC;  Surgeon: [name], MD;  Location: [facility];  Service: General;   Laterality: N/A;   IR BILIARY DRAIN PLACEMENT WITH CHOLANGIOGRAM  09/08/2020   IR ENDOLUMINAL BX OF BILIARY TREE  09/20/2020   IR EXCHANGE BILIARY DRAIN  09/20/2020   IRRIGATION AND DEBRIDEMENT ABSCESS N/A 01/01/2014   Procedure: IRRIGATION AND DEBRIDEMENT ABSCESS;  Surgeon: [name], MD;  Location: [facility];  Service: General;  Laterality: N/A;   LAPAROSCOPIC LYSIS OF ADHESIONS N/A 07/05/2012   Procedure: LAPAROSCOPIC LYSIS OF ADHESIONS;  Surgeon: [name], MD;  Location: [facility];  Service: General;  Laterality: N/A;  repair of abdominal wall hernia   right tube and ovary removed      UPPER GI ENDOSCOPY N/A 07/05/2012   Procedure: UPPER GI ENDOSCOPY;  Surgeon: [name], MD;  Location: [facility];  Service:  General;  Laterality: N/A;   WHIPPLE PROCEDURE N/A 11/05/2020   Procedure: WHIPPLE PROCEDURE;  Surgeon: Dwan Bolt, MD;  Location: [facility];  Service: General;  Laterality: N/A;    Family History  Problem Relation Age of Onset   Colon cancer Mother 6   Graves' disease Mother    Cancer Father    Depression Father    Diabetes Father    Heart disease Father    Bladder Cancer Father    Heart Problems Father    Depression Sister    Post-traumatic stress disorder Sister    Anxiety disorder Sister    Eczema Sister    Breast cancer Maternal Aunt    Alcohol abuse Paternal Uncle    Melanoma Maternal Grandmother    Colon cancer Maternal Grandmother        does not know age of onset   Healthy Maternal Grandfather    Healthy Paternal Grandmother    Heart disease Paternal Grandfather    Stroke Paternal Grandfather    Diabetes Paternal Grandfather    Bipolar disorder Cousin     Social History   Tobacco Use   Smoking status: Former    Packs/day: 0.10    Years: 1.00    Pack years: 0.10    Types: Cigarettes    Quit date: 04/27/2004    Years since quitting: 16.7   Smokeless tobacco: Never  Substance Use Topics   Alcohol use: Not Currently    Subjective:   5 day  follow up on blood pressure- medication was recently stopped by GI due to hypotension/ fatigue; has been feeling better since stopping mediation but still having fatigue; Admits not eating regularly due to dry mouth/ still recovering from surgery;    Objective:  Vitals:   01/22/21 1545  BP: 110/80  Pulse: 99  Temp: 98.1 F (36.7 C)  TempSrc: Oral  SpO2: 97%  Weight: 216 lb 6.4 oz (98.2 kg)  Height: 5\' 9"  (1.753 m)    General: Well developed, well nourished, in no acute distress  Skin : Warm and dry.  Head: Normocephalic and atraumatic  Eyes: Sclera and conjunctiva clear; pupils round and reactive to light; extraocular movements intact  Ears: External normal; canals clear; tympanic membranes normal  Oropharynx: Pink, supple. No suspicious lesions  Neck: Supple without thyromegaly, adenopathy  Lungs: Respirations unlabored; clear to auscultation bilaterally without wheeze, rales, rhonchi  CVS exam: normal rate and regular rhythm.  Neurologic: Alert and oriented; speech intact; face symmetrical; moves all extremities well; CNII-XII intact without focal deficit   Assessment:  1. Other fatigue     Plan:  Improved since stopping Toprol XL- will continue to monitor her pressure but agree no medication is needed; Stressed need to work on increased caloric intake- [employer], cottage cheese, smoothies; she will work on trying to drink at least 2 protein drinks per day; Keep planned follow up with rheumatology/ GI as scheduled- will determine follow up based on these appointments/ weight response.  This visit occurred during the SARS-CoV-2 public health emergency.  Safety protocols were in place, including screening questions prior to the visit, additional usage of staff PPE, and extensive cleaning of exam room while observing appropriate contact time as indicated for disinfecting solutions.    No follow-ups on file.  No orders of the defined types were placed in this  encounter.   Requested Prescriptions    No prescriptions requested or ordered in this encounter

## 2021-01-24 ENCOUNTER — Other Ambulatory Visit: Payer: Self-pay

## 2021-01-24 ENCOUNTER — Ambulatory Visit (INDEPENDENT_AMBULATORY_CARE_PROVIDER_SITE_OTHER): Payer: 59 | Admitting: *Deleted

## 2021-01-24 DIAGNOSIS — L501 Idiopathic urticaria: Secondary | ICD-10-CM

## 2021-01-24 MED ORDER — OMALIZUMAB 150 MG ~~LOC~~ SOLR
300.0000 mg | SUBCUTANEOUS | Status: AC
  Administered 2021-01-24 – 2021-02-21 (×2): 300 mg via SUBCUTANEOUS

## 2021-01-25 ENCOUNTER — Telehealth: Payer: Self-pay

## 2021-01-25 DIAGNOSIS — K861 Other chronic pancreatitis: Secondary | ICD-10-CM

## 2021-01-25 NOTE — Telephone Encounter (Signed)
----- Message from Ladene Artist, MD sent at 01/22/2021  3:56 PM EDT ----- Please contact her and arrange the referral to Charlett Nose, MD at [facility] for mgmt of autoimmune pancreatitis. Obtain a Quantiferon Gold and HBsAg. She is scheduled for an EGD on 11/8 and if no ulcer, esophagitis, obstruction, etc. noted will proceed with UGI SBFT. If possible please ask her to schedule her EGD sooner. I have openings on 10/17 and 10/19 in the afternoon.    ----- Message ----- From: Irving Copas., MD Sent: 01/22/2021  12:05 PM EDT To: [name], MD, Ladene Artist, MD  MS and DJ, Very interesting case. For these patients, I think of them kind of like our IBD patients and that they will likely need long-term autoimmune/immune suppressive therapy. Would do the normal laboratories as if someone was going on chronic steroids for IBD or biologic therapy for IBD. If no other issues are found on her upper endoscopy and small bowel follow-through, recommend consideration of initiation of prednisone 40 mg daily and monitor symptoms with plan for a taper every 2 weeks and maintenance at 20 mg. She may need immunomodulators therapy as well in the long-term. I would send this patient to Charlett Nose at [facility] as he is the main medical pancreas I will just and has used rituximab on some individuals when I was still there. Look forward to hearing what happens with this patient moving forward. While she is on prednisone therapy you can monitor her IgG4 level as a means of trying to see how she does as well as based on symptoms. All the best. I will forward you his information so if you want to talk with him you can have the opportunity to get some other thoughts on treatment until he can see her. Thanks. 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 Whipple was performed for a concerning biliary stricture. She has experienced early satiety, weight loss and a poor appetite since her Whipple. She is scheduled for an EGD.   I would like to refer her to [facility] for ongoing mgmt of her autoimmune pancreatitis or at least a mgmt opinion and mgmt guidance. Who would you suggest at [employer] [facility] [employer] [facility]?  Linna Hoff, this is an FYI since you were involved in her care.   Thanks,   Norberto Sorenson   ----- Message ----- From: [name], MD Sent: 12/04/2020   1:01 PM EDT To: Ladene Artist, MD, Dwan Bolt, MD  [facility], Well that is definitely great for her.  This is certainly described in our literature however I personally have never seen a patient with it.  I am going to forward this to Lucio Edward who is her primary gastroenterologist about arranging follow-up care.  Thanks  Norberto Sorenson, See above.     ----- Message ----- From: Dwan Bolt, MD Sent: 12/04/2020  12:18 PM EDT To: [name], MD  Linna Hoff, Ms. 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 There was no malignancy. I was not expecting this. Have you seen many patients with autoimmune pancreatitis or would you be willing to see her for this? Should she receive any steroid treatment to prevent recurrent symptoms in her remnant  pancreas? I am planning to discuss her case at [facility] as well. Thanks for your input. Wilburn Cornelia

## 2021-01-25 NOTE — Telephone Encounter (Signed)
Left message for patient to call back Labs entered Referral faxed to [facility]

## 2021-01-28 ENCOUNTER — Ambulatory Visit (INDEPENDENT_AMBULATORY_CARE_PROVIDER_SITE_OTHER): Payer: 59 | Admitting: Psychology

## 2021-01-28 DIAGNOSIS — F411 Generalized anxiety disorder: Secondary | ICD-10-CM | POA: Diagnosis not present

## 2021-01-28 DIAGNOSIS — F331 Major depressive disorder, recurrent, moderate: Secondary | ICD-10-CM

## 2021-01-29 NOTE — Telephone Encounter (Signed)
Left message for patient to call back  

## 2021-01-31 NOTE — Telephone Encounter (Signed)
Left message for patient to call back  

## 2021-02-01 NOTE — Telephone Encounter (Signed)
Patient notified of the recommendations She has been rescheduled to 02/06/21 She will come for labs next week. She is aware that she will be contacted directly by [facility] with an appointment date and time.

## 2021-02-02 NOTE — Progress Notes (Signed)
Office Visit Note  Patient: Brittany [name]             Date of Birth: 1965-05-27           MRN: [id number]             PCP: [name], FNP Referring: [name],* Visit Date: 02/04/2021   Subjective:  History of Present Illness: Brittany [name] is a 55 y.o. female here for follow up for new diagnosis of IgG4 related disease autoimmune pancreatitis. Lab results at initial visit were highly positive for serum IgG 4 of 194.9 with total serum IgG 2,105. Serum amylase and lipase were very low below test range. Serum albumin was 2.3 and LFTs moderately elevated. Since our last visit no major symptom changes. She is scheduled for EGD on Wednesday 10/26 with Dr. Fuller Plan to better evaluate her ongoing symptoms and her obstruction problem.   Previous HPI 01/15/21 Brittany [name] is a 55 y.o. 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  She thinks current problems started in around the past year with generally feeling unwell chronic fatigue and was having ongoing unintentional weight loss of about 50-60 pounds ongoing during that time.  She noticed occasional episodes of vomiting or diarrhea so did not tolerate abnormal diet for her.  However no specific abdominal pain complaints.  She went for a visit with her primary care doctor lab work showed abnormal liver function tests.  This led to abdominal ultrasound with abnormal findings leading to abdominal MRI with MRCP in May.  Mid common bile duct stricture was visualized no specific pancreatic mass but suspicion for periductal lesion she was not a candidate for endoscopic ultrasound due to history of Roux-en-Y gastric bypass surgery.  She went for subsequent biliary drain placement. Due to some atypical cells seen, elevated CA 19-9 level, and suspicious imaging she underwent whipple procedure on 7/25.  Body and head of the pancreas were noted to be somewhat atrophic in procedural note. Pathology was negative for malignancy but consistent with autoimmune sclerosing pancreatitis. Since surgery her appetite is very poor limiting oral intake. She has noticed a large amount of leg swelling and rapidly worsened strength in her legs, now unable to climb stairs or get up from the floor unaided. She also notices dry mouth since her surgery. She denies any new skin rashes, no swollen nodules or glands. No particular wound healing complications or ongoing abdominal pain. She has a follow up appointment scheduled to see [facility] with Dr. Fuller Plan later this week.   Review of Systems  Constitutional:  Positive for fatigue.  HENT:  Positive for mouth dryness and nose dryness. Negative for mouth sores.        Nose sores  Eyes:  Negative for pain, itching and dryness.  Respiratory:  Positive for shortness of breath. Negative for difficulty breathing.   Cardiovascular:  Negative for chest pain and palpitations.  Gastrointestinal:  Negative for blood in stool, constipation and diarrhea.  Endocrine: Negative for increased urination.  Genitourinary:  Negative for difficulty urinating.  Musculoskeletal:  Negative for joint pain, joint pain, joint swelling, myalgias, morning stiffness, muscle tenderness and myalgias.  Skin:  Negative for color change, rash and redness.  Allergic/Immunologic: Negative for susceptible to infections.  Neurological:  Positive for dizziness and weakness. Negative for numbness, headaches and memory loss.  Hematological:  Negative  for bruising/bleeding tendency.  Psychiatric/Behavioral:  Negative for confusion.    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[place]) 12/27/2020   Fatigue 12/27/2020   Biliary stricture  11/05/2020   Abdominal pain 09/13/2020   Biliary obstruction 09/12/2020   Obstructive jaundice 09/08/2020   Elevated LFTs 09/08/2020   Choledocholithiasis 09/07/2020   Urticaria    Ulcer, stomach peptic    Sleep apnea    Positive TB test    Migraines    Hypercholesterolemia    History of chicken pox    H/O hiatal hernia    Blood transfusion without reported diagnosis    Arthritis    Anxiety    Angio-edema    Allergy    Allergic contact dermatitis 07/09/2020   Chronic idiopathic urticaria 01/17/2019   Seasonal and perennial allergic rhinoconjunctivitis 11/08/2018   Allergic reaction 11/08/2018   Diarrhea 11/08/2018   Right thigh pain 05/19/2018   Melena    Acute gastrojejunal anastomotic ulcer    Macrocytic anemia    Acute GI bleeding 09/28/2017   Epigastric abdominal pain 11/19/2016   Incisional hernia with obstruction but no gangrene 07/21/2016   Menopause 04/22/2016   Routine general medical examination at a health care facility 03/18/2016   Obesity 03/18/2016   Severe episode of recurrent major depressive disorder, without psychotic features ([place])    Depression, major, severe recurrence ([place]) 09/11/2015   Major depressive disorder, recurrent, severe without psychotic features ([place])    MDD (major depressive disorder) 10/30/2014   MDD (major depressive disorder), recurrent severe, without psychosis ([place])    Suicidal ideation 10/05/2014   Depression 06/12/2014   Rash 01/04/2014   Normocytic anemia 01/04/2014   DJD (degenerative joint disease) 01/04/2014   Hypertension 01/04/2014   Dyslipidemia 01/04/2014   GERD (gastroesophageal reflux disease) 01/04/2014   Obstructive sleep apnea 01/04/2014   Abdominal abscess 01/01/2014   Wound infection after surgery 01/01/2014   PTSD (post-traumatic stress disorder) 11/23/2013   Severe recurrent major depression without psychotic features ([place]) 11/22/2013   History of Roux-en-Y gastric bypass, 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 11/11/2012   Morbid  obesity, Weight - 312, BMI - 45.2 04/21/2012    Past Medical History:  Diagnosis Date   Allergy    Angio-edema    Anxiety    Arthritis    hips, knees and hands    Autoimmune pancreatitis ([place])    Blood transfusion without reported diagnosis    Depression    GERD (gastroesophageal reflux disease)    hx of    H/O hiatal hernia    History of chicken pox    Hypercholesterolemia    Hypertension    Migraine    Migraines    Morbid obesity ([place])    Pancreatitis due to biliary obstruction    Positive TB test    PTSD (post-traumatic stress disorder)    Sleep apnea    no CPAP machine    Ulcer, stomach peptic    Urticaria     Family History  Problem Relation Age of Onset   Colon cancer Mother 81   Graves' disease Mother    Cancer Father    Depression Father    Diabetes Father    Heart disease Father    Bladder Cancer Father    Heart Problems Father    Depression Sister    Post-traumatic stress disorder Sister  Anxiety disorder Sister    Eczema Sister    Breast cancer Maternal Aunt    Alcohol abuse Paternal Uncle    Melanoma Maternal Grandmother    Colon cancer Maternal Grandmother        does not know age of onset   Healthy Maternal Grandfather    Healthy Paternal Grandmother    Heart disease Paternal Grandfather    Stroke Paternal Grandfather    Diabetes Paternal Grandfather    Bipolar disorder Cousin    Past Surgical History:  Procedure Laterality Date   ABDOMINAL HYSTERECTOMY  1997 & 2006   BREATH TEK H PYLORI  05/03/2012   Procedure: BREATH TEK Kandis Ban;  Surgeon: [name], MD;  Location: [name];  Service: General;  Laterality: N/A;   COLONOSCOPY     hx of benign polyps    ESOPHAGOGASTRODUODENOSCOPY N/A 09/29/2017   Procedure: ESOPHAGOGASTRODUODENOSCOPY (EGD);  Surgeon: Ladene Artist, MD;  Location: [name];  Service: Endoscopy;  Laterality: N/A;   ESOPHAGOGASTRODUODENOSCOPY N/A 09/14/2020   Procedure: ESOPHAGOGASTRODUODENOSCOPY (EGD);   Surgeon: [name], MD;  Location: [name];  Service: Endoscopy;  Laterality: N/A;   EUS N/A 09/14/2020   Procedure: UPPER ENDOSCOPIC ULTRASOUND (EUS) RADIAL;  Surgeon: [name], MD;  Location: [facility];  Service: Endoscopy;  Laterality: N/A;   EYE SURGERY     fixed a hole in the left eye   FOOT SURGERY Left    GASTRIC BYPASS     GASTRIC ROUX-EN-Y N/A 07/05/2012   Procedure: LAPAROSCOPIC ROUX-EN-Y GASTRIC;  Surgeon: [name], MD;  Location: [facility];  Service: General;  Laterality: N/A;   IR BILIARY DRAIN PLACEMENT WITH CHOLANGIOGRAM  09/08/2020   IR ENDOLUMINAL BX OF BILIARY TREE  09/20/2020   IR EXCHANGE BILIARY DRAIN  09/20/2020   IRRIGATION AND DEBRIDEMENT ABSCESS N/A 01/01/2014   Procedure: IRRIGATION AND DEBRIDEMENT ABSCESS;  Surgeon: [name], MD;  Location: [facility];  Service: General;  Laterality: N/A;   LAPAROSCOPIC LYSIS OF ADHESIONS N/A 07/05/2012   Procedure: LAPAROSCOPIC LYSIS OF ADHESIONS;  Surgeon: [name], MD;  Location: [facility];  Service: General;  Laterality: N/A;  repair of abdominal wall hernia   right tube and ovary removed      UPPER GI ENDOSCOPY N/A 07/05/2012   Procedure: UPPER GI ENDOSCOPY;  Surgeon: [name], MD;  Location: [facility];  Service: General;  Laterality: N/A;   WHIPPLE PROCEDURE N/A 11/05/2020   Procedure: WHIPPLE PROCEDURE;  Surgeon: Dwan Bolt, MD;  Location: [facility];  Service: General;  Laterality: N/A;   Social History   Social History Narrative   Fun: Color and walk, house renovations   Denies abuse and feels safe at home.    Immunization History  Administered Date(s) Administered   Influenza,inj,Quad PF,6+ Mos 01/09/2014, 01/13/2019   Influenza-Unspecified 01/14/2018, 01/13/2019   PFIZER(Purple Top)SARS-COV-2 Vaccination 07/14/2019, 08/08/2019, 03/30/2020   Tdap 12/28/2010     Objective: Vital Signs: BP 118/83 (BP Location: Left Arm, Patient Position: Sitting, Cuff Size: Small)   Pulse 93    Resp 12   Ht 5\' 9"  (1.753 m)   Wt 217 lb (98.4 kg)   BMI 32.05 kg/m    Physical Exam Musculoskeletal:     Comments: 2+ pitting edema in bilateral legs no rash erythema or induration  Skin:    General: Skin is warm and dry.     Findings: No rash.  Neurological:     Mental Status: She is alert.  Investigation: No additional findings.  Imaging: No results found.  Recent Labs: Lab Results  Component Value Date   WBC 4.1 01/15/2021   HGB 11.9 01/15/2021   PLT 270 01/15/2021   NA 139 01/15/2021   K 4.2 01/15/2021   CL 104 01/15/2021   CO2 26 01/15/2021   GLUCOSE 108 (H) 01/15/2021   BUN 10 01/15/2021   CREATININE 0.48 (L) 01/15/2021   BILITOT 1.0 01/15/2021   ALKPHOS 168 (H) 11/10/2020   AST 46 (H) 01/15/2021   ALT 82 (H) 01/15/2021   PROT 5.7 (L) 01/15/2021   ALBUMIN 1.6 (L) 11/10/2020   CALCIUM 8.0 (L) 01/15/2021   GFRAA 115 05/24/2018    Speciality Comments: No specialty comments available.  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 Discussion with Dr. Fuller Plan recommends to treat, initial would most commonly 0.3-0.5 mg/kg prednisone equivalent before tapering slowly, probably methylprednisolone preferred with hepatic function changes. With plan to treat already, I do not see much utility in PET or other additional studies to identify occult inflammation. She has extremely low pancreatic enzyme tests, likely could benefit with treatment for pancreatic insufficiency as well. Already scheduled for EGD later this week will be informative. We can see her back as needed if helping for IMT management or  concern of systemic symptoms outside of GI problem, else can f/u for GI as primary.  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 History of Whipple procedure  Currently does not tolerate adequate food intake with early satiety, abdominal pain with excessive eating, and continued weight loss. Not clear how much is related to multiple surgery versus autoimmune disease and pancreas disease, no dumping type symptoms reported currently.   Orders: No orders of the defined types were placed in this encounter.  No orders of the defined types were placed in this encounter.    Follow-Up Instructions: Return if symptoms worsen or fail to improve.   Collier Salina, MD  Note - This record has been created using [employer].  Chart creation errors have been sought, but may not always  have been located. Such creation errors do not reflect on  the standard of medical care.

## 2021-02-04 ENCOUNTER — Other Ambulatory Visit: Payer: Self-pay

## 2021-02-04 ENCOUNTER — Ambulatory Visit (INDEPENDENT_AMBULATORY_CARE_PROVIDER_SITE_OTHER): Payer: 59 | Admitting: Internal Medicine

## 2021-02-04 ENCOUNTER — Encounter: Payer: Self-pay | Admitting: Internal Medicine

## 2021-02-04 ENCOUNTER — Other Ambulatory Visit: Payer: 59

## 2021-02-04 VITALS — BP 118/83 | HR 93 | Resp 12 | Ht 69.0 in | Wt 217.0 lb

## 2021-02-04 DIAGNOSIS — K861 Other chronic pancreatitis: Secondary | ICD-10-CM

## 2021-02-04 DIAGNOSIS — Z90410 Acquired total absence of pancreas: Secondary | ICD-10-CM | POA: Insufficient documentation

## 2021-02-04 DIAGNOSIS — Z9884 Bariatric surgery status: Secondary | ICD-10-CM | POA: Diagnosis not present

## 2021-02-04 DIAGNOSIS — Z9049 Acquired absence of other specified parts of digestive tract: Secondary | ICD-10-CM | POA: Diagnosis not present

## 2021-02-06 ENCOUNTER — Encounter: Payer: Self-pay | Admitting: Gastroenterology

## 2021-02-06 ENCOUNTER — Ambulatory Visit (AMBULATORY_SURGERY_CENTER): Payer: 59 | Admitting: Gastroenterology

## 2021-02-06 VITALS — BP 123/67 | HR 80 | Temp 97.8°F | Resp 16 | Ht 69.0 in | Wt 217.0 lb

## 2021-02-06 DIAGNOSIS — K2289 Other specified disease of esophagus: Secondary | ICD-10-CM | POA: Diagnosis not present

## 2021-02-06 DIAGNOSIS — R634 Abnormal weight loss: Secondary | ICD-10-CM

## 2021-02-06 DIAGNOSIS — R630 Anorexia: Secondary | ICD-10-CM | POA: Diagnosis not present

## 2021-02-06 DIAGNOSIS — R6881 Early satiety: Secondary | ICD-10-CM | POA: Diagnosis present

## 2021-02-06 DIAGNOSIS — K3189 Other diseases of stomach and duodenum: Secondary | ICD-10-CM

## 2021-02-06 MED ORDER — NYSTATIN 100000 UNIT/ML MT SUSP: 1.0000 mL | Freq: Four times a day (QID) | OROMUCOSAL | 0 refills | Status: AC

## 2021-02-06 MED ORDER — ESOMEPRAZOLE MAGNESIUM 40 MG PO CPDR: DELAYED_RELEASE_CAPSULE | ORAL | 12 refills | Status: DC

## 2021-02-06 MED ORDER — SODIUM CHLORIDE 0.9 % IV SOLN
500.0000 mL | Freq: Once | INTRAVENOUS | Status: DC
Start: 2021-02-06 — End: 2021-02-06

## 2021-02-06 NOTE — Progress Notes (Signed)
VS taken by DT 

## 2021-02-06 NOTE — Progress Notes (Signed)
Called to room to assist during endoscopic procedure.  Patient ID and intended procedure confirmed with present staff. Received instructions for my participation in the procedure from the performing physician.  

## 2021-02-06 NOTE — Op Note (Signed)
[facility] Patient Name: Brittany [name] Procedure Date: 02/06/2021 2:28 PM MRN: [id number] Endoscopist: Ladene Artist , MD Age: 55 Referring MD:  Date of Birth: November 01, 1965 Gender: Female Account #: [id number] Procedure:                Upper GI endoscopy Indications:              Anorexia, Early satiety, Weight loss Medicines:                Monitored Anesthesia Care Procedure:                Pre-Anesthesia Assessment:                           - Prior to the procedure, a History and Physical                            was performed, and patient medications and                            allergies were reviewed. The patient's tolerance of                            previous anesthesia was also reviewed. The risks                            and benefits of the procedure and the sedation                            options and risks were discussed with the patient.                            All questions were answered, and informed consent                            was obtained. Prior Anticoagulants: The patient has                            taken no previous anticoagulant or antiplatelet                            agents. ASA Grade Assessment: II - A patient with                            mild systemic disease. After reviewing the risks                            and benefits, the patient was deemed in                            satisfactory condition to undergo the procedure.                           After obtaining informed consent, the endoscope was  passed under direct vision. Throughout the                            procedure, the patient's blood pressure, pulse, and                            oxygen saturations were monitored continuously. The                            Endoscope was introduced through the mouth, and                            advanced to the efferent jejunal loop. The upper GI                            endoscopy was  accomplished without difficulty. The                            patient tolerated the procedure well. Scope In: Scope Out: Findings:                 Localized mild patchy erythema was found in the mid                            esophagus.                           Patchy, white plaques were found in the mid                            esophagus and in the distal esophagus. Biopsies                            were taken with a cold forceps for histology.                           The exam of the esophagus was otherwise normal.                           Evidence of a gastric bypass was found,                            gastroenterostomy. A gastric pouch was found and                            appeared normal. The staple line appeared intact. 3                            staple visible at the anastomosis. This was                            traversed.                           The exam of the stomach was  otherwise normal.                           The examined afferent and efferent jejunumal limb                            appeared normal. Complications:            No immediate complications. Estimated Blood Loss:     Estimated blood loss was minimal. Impression:               - Erythema in the mid esophagus.                           - Esophageal plaques were found, suspicious for                            candidiasis. Biopsied.                           - Gastric bypass with intact staple line.                           - Normal examined jejunum. Recommendation:           - Patient has a contact number available for                            emergencies. The signs and symptoms of potential                            delayed complications were discussed with the                            patient. Return to normal activities tomorrow.                            Written discharge instructions were provided to the                            patient.                           - Resume  previous diet.                           - Continue present medications.                           - Nystatin oral suspension 100,000U/mL take 4 mL po                            qid for 7 days, no refills.                           - Nexium or Prilosec 40 mg capsules po qd, 1 year  of refills, open capsule sprinkle granules in apple                            sauce                           - Await pathology results. Ladene Artist, MD 02/06/2021 2:59:42 PM This report has been signed electronically.

## 2021-02-06 NOTE — Progress Notes (Signed)
1435 Robinul 0.1 mg IV given due large amount of secretions upon assessment.  MD made aware, vss 

## 2021-02-06 NOTE — Progress Notes (Signed)
Report given to PACU, vss 

## 2021-02-06 NOTE — Patient Instructions (Signed)
Pick up prescriptions for Nystatin ad Nexium from pharmacy. Resume previous diet and continue present medications. Await pathology results.   YOU HAD AN ENDOSCOPIC PROCEDURE TODAY AT [facility]:   Refer to the procedure report that was given to you for any specific questions about what was found during the examination.  If the procedure report does not answer your questions, please call your gastroenterologist to clarify.  If you requested that your care partner not be given the details of your procedure findings, then the procedure report has been included in a sealed envelope for you to review at your convenience later.  YOU SHOULD EXPECT: Some feelings of bloating in the abdomen. Passage of more gas than usual.  Walking can help get rid of the air that was put into your GI tract during the procedure and reduce the bloating. If you had a lower endoscopy (such as a colonoscopy or flexible sigmoidoscopy) you may notice spotting of blood in your stool or on the toilet paper. If you underwent a bowel prep for your procedure, you may not have a normal bowel movement for a few days.  Please Note:  You might notice some irritation and congestion in your nose or some drainage.  This is from the oxygen used during your procedure.  There is no need for concern and it should clear up in a day or so.  SYMPTOMS TO REPORT IMMEDIATELY:  Following upper endoscopy (EGD)  Vomiting of blood or coffee ground material  New chest pain or pain under the shoulder blades  Painful or persistently difficult swallowing  New shortness of breath  Fever of 100F or higher  Black, tarry-looking stools  For urgent or emergent issues, a gastroenterologist can be reached at any hour by calling [phone]. Do not use MyChart messaging for urgent concerns.    DIET:  We do recommend a small meal at first, but then you may proceed to your regular diet.  Drink plenty of fluids but you should avoid alcoholic  beverages for 24 hours.  ACTIVITY:  You should plan to take it easy for the rest of today and you should NOT DRIVE or use heavy machinery until tomorrow (because of the sedation medicines used during the test).    FOLLOW UP: Our staff will call the number listed on your records 48-72 hours following your procedure to check on you and address any questions or concerns that you may have regarding the information given to you following your procedure. If we do not reach you, we will leave a message.  We will attempt to reach you two times.  During this call, we will ask if you have developed any symptoms of COVID 19. If you develop any symptoms (ie: fever, flu-like symptoms, shortness of breath, cough etc.) before then, please call [phone].  If you test positive for Covid 19 in the 2 weeks post procedure, please call and report this information to [place].    If any biopsies were taken you will be contacted by phone or by letter within the next 1-3 weeks.  Please call us at [phone] if you have not heard about the biopsies in 3 weeks.    SIGNATURES/CONFIDENTIALITY: You and/or your care partner have signed paperwork which will be entered into your electronic medical record.  These signatures attest to the fact that that the information above on your After Visit Summary has been reviewed and is understood.  Full responsibility of the confidentiality of this discharge information  lies with you and/or your care-partner.  

## 2021-02-06 NOTE — Progress Notes (Signed)
See 02/04/2021 H&P, no changes.

## 2021-02-06 NOTE — Progress Notes (Signed)
HR > 100 with esmolol 25 mg given IV, MD updated, vss 

## 2021-02-07 LAB — QUANTIFERON-TB GOLD PLUS
Mitogen-NIL: 9.15 IU/mL
NIL: 0.02 IU/mL
QuantiFERON-TB Gold Plus: NEGATIVE
TB1-NIL: 0.03 IU/mL
TB2-NIL: 0.05 IU/mL

## 2021-02-07 LAB — HEPATITIS B SURFACE ANTIGEN: Hepatitis B Surface Ag: NONREACTIVE

## 2021-02-08 ENCOUNTER — Telehealth: Payer: Self-pay | Admitting: *Deleted

## 2021-02-08 ENCOUNTER — Other Ambulatory Visit: Payer: Self-pay | Admitting: Allergy

## 2021-02-08 NOTE — Telephone Encounter (Signed)
  Follow up Call-  Call back number 02/06/2021  Post procedure Call Back phone  # [phone]  Permission to leave phone message Yes  Some recent data might be hidden     Patient questions:  Do you have a fever, pain , or abdominal swelling? No. Pain Score  0 *  Have you tolerated food without any problems? Yes.    Have you been able to return to your normal activities? Yes.    Do you have any questions about your discharge instructions: Diet   No. Medications  No. Follow up visit  No.  Do you have questions or concerns about your Care? No.  Actions: * If pain score is 4 or above: No action needed, pain <4.

## 2021-02-19 ENCOUNTER — Encounter: Payer: 59 | Admitting: Gastroenterology

## 2021-02-19 NOTE — Telephone Encounter (Signed)
Inbound call from patient stating she never received a call from [facility].  Please advise.

## 2021-02-20 ENCOUNTER — Encounter: Payer: Self-pay | Admitting: Gastroenterology

## 2021-02-20 NOTE — Telephone Encounter (Signed)
Per scheduling staff at [facility], patient has not returned their calls to schedule her.  Patient notified and provided the phone number to schedule with them directly.

## 2021-02-21 ENCOUNTER — Ambulatory Visit (INDEPENDENT_AMBULATORY_CARE_PROVIDER_SITE_OTHER): Payer: 59

## 2021-02-21 ENCOUNTER — Other Ambulatory Visit: Payer: Self-pay

## 2021-02-21 DIAGNOSIS — L501 Idiopathic urticaria: Secondary | ICD-10-CM

## 2021-02-25 ENCOUNTER — Ambulatory Visit (INDEPENDENT_AMBULATORY_CARE_PROVIDER_SITE_OTHER): Payer: 59 | Admitting: Psychology

## 2021-02-25 DIAGNOSIS — F331 Major depressive disorder, recurrent, moderate: Secondary | ICD-10-CM | POA: Diagnosis not present

## 2021-02-25 DIAGNOSIS — F411 Generalized anxiety disorder: Secondary | ICD-10-CM

## 2021-03-04 ENCOUNTER — Encounter: Payer: Self-pay | Admitting: Gastroenterology

## 2021-03-04 ENCOUNTER — Encounter (HOSPITAL_COMMUNITY): Payer: Self-pay | Admitting: Psychiatry

## 2021-03-04 ENCOUNTER — Other Ambulatory Visit: Payer: Self-pay

## 2021-03-04 ENCOUNTER — Telehealth (HOSPITAL_BASED_OUTPATIENT_CLINIC_OR_DEPARTMENT_OTHER): Payer: 59 | Admitting: Psychiatry

## 2021-03-04 ENCOUNTER — Ambulatory Visit: Payer: 59 | Admitting: Gastroenterology

## 2021-03-04 ENCOUNTER — Telehealth: Payer: Self-pay | Admitting: Gastroenterology

## 2021-03-04 DIAGNOSIS — F4310 Post-traumatic stress disorder, unspecified: Secondary | ICD-10-CM | POA: Diagnosis not present

## 2021-03-04 DIAGNOSIS — F410 Panic disorder [episodic paroxysmal anxiety] without agoraphobia: Secondary | ICD-10-CM | POA: Diagnosis not present

## 2021-03-04 DIAGNOSIS — F3341 Major depressive disorder, recurrent, in partial remission: Secondary | ICD-10-CM

## 2021-03-04 MED ORDER — LORAZEPAM 0.5 MG PO TABS: 0.5000 mg | ORAL_TABLET | Freq: Every day | ORAL | 0 refills | Status: AC | PRN

## 2021-03-04 MED ORDER — QUETIAPINE FUMARATE 50 MG PO TABS: ORAL_TABLET | ORAL | 2 refills | Status: AC

## 2021-03-04 MED ORDER — DULOXETINE HCL 60 MG PO CPEP: 60.0000 mg | ORAL_CAPSULE | Freq: Two times a day (BID) | ORAL | 0 refills | Status: AC
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
## 2021-03-04 NOTE — Telephone Encounter (Signed)
Please see my chart message sent today by patient.

## 2021-03-04 NOTE — Telephone Encounter (Signed)
Patient called to cancel her follow-up appointment this morning with Dr. Fuller Plan. She said she is unable to ambulate to get to the appointment.  She did not reschedule because she said she would not be able to come in.

## 2021-03-11 ENCOUNTER — Ambulatory Visit (INDEPENDENT_AMBULATORY_CARE_PROVIDER_SITE_OTHER): Payer: 59 | Admitting: Psychology

## 2021-03-11 DIAGNOSIS — F411 Generalized anxiety disorder: Secondary | ICD-10-CM

## 2021-03-11 DIAGNOSIS — F331 Major depressive disorder, recurrent, moderate: Secondary | ICD-10-CM

## 2021-03-12 ENCOUNTER — Emergency Department (HOSPITAL_COMMUNITY)
Admission: EM | Admit: 2021-03-12 | Discharge: 2021-03-12 | Disposition: A | Discharge status: Home / Self Care | Payer: 59 | Attending: Emergency Medicine | Admitting: Emergency Medicine

## 2021-03-12 ENCOUNTER — Encounter (HOSPITAL_COMMUNITY): Payer: Self-pay

## 2021-03-12 ENCOUNTER — Other Ambulatory Visit: Payer: Self-pay

## 2021-03-12 DIAGNOSIS — Z87891 Personal history of nicotine dependence: Secondary | ICD-10-CM | POA: Insufficient documentation

## 2021-03-12 DIAGNOSIS — I10 Essential (primary) hypertension: Secondary | ICD-10-CM | POA: Insufficient documentation

## 2021-03-12 DIAGNOSIS — R531 Weakness: Secondary | ICD-10-CM | POA: Insufficient documentation

## 2021-03-12 LAB — CBC
HCT: 33 % — ABNORMAL LOW (ref 36.0–46.0)
Hemoglobin: 11 g/dL — ABNORMAL LOW (ref 12.0–15.0)
MCH: 35.8 pg — ABNORMAL HIGH (ref 26.0–34.0)
MCHC: 33.3 g/dL (ref 30.0–36.0)
MCV: 107.5 fL — ABNORMAL HIGH (ref 80.0–100.0)
Platelets: 277 10*3/uL (ref 150–400)
RBC: 3.07 MIL/uL — ABNORMAL LOW (ref 3.87–5.11)
RDW: 15.7 % — ABNORMAL HIGH (ref 11.5–15.5)
WBC: 6 10*3/uL (ref 4.0–10.5)
nRBC: 0 % (ref 0.0–0.2)

## 2021-03-12 LAB — BASIC METABOLIC PANEL
Anion gap: 8 (ref 5–15)
BUN: 14 mg/dL (ref 6–20)
CO2: 23 mmol/L (ref 22–32)
Calcium: 7.8 mg/dL — ABNORMAL LOW (ref 8.9–10.3)
Chloride: 106 mmol/L (ref 98–111)
Creatinine, Ser: 0.47 mg/dL (ref 0.44–1.00)
GFR, Estimated: >60 mL/min (ref >60)
Glucose, Bld: 113 mg/dL — ABNORMAL HIGH (ref 70–99)
Potassium: 3.8 mmol/L (ref 3.5–5.1)
Sodium: 137 mmol/L (ref 135–145)

## 2021-03-12 LAB — CBG MONITORING, ED: Glucose-Capillary: 99 mg/dL (ref 70–99)

## 2021-03-12 NOTE — ED Provider Notes (Signed)
[facility] DEPT Provider Note   CSN: [id number] Arrival date & time: 03/12/21  1025     History Chief Complaint  Patient presents with   Near Syncope   Weakness    Brittany LAURELYN [name] is a 55 y.o. female.  55 year old female who presents with several months of bilateral lower extremity weakness.  Patient states that she is eating and drinking appropriately.  Denies any emesis or diarrhea.  No lower back pain.  No bowel or bladder dysfunction.  States that she has been having trouble transferring herself from chair to chair at home.  Denies any actual falls.  Has been evaluated for home health but did not qualify due to insurance reasons.  Has been seen by multiple medical specialist without a definitive cause for her current situation.      Past Medical History:  Diagnosis Date   Allergy    Angio-edema    Anxiety    Arthritis    hips, knees and hands    Autoimmune pancreatitis ([place])    Blood transfusion without reported diagnosis    Depression    GERD (gastroesophageal reflux disease)    hx of    H/O hiatal hernia    History of chicken pox    Hypercholesterolemia    Hypertension    Migraine    Migraines    Morbid obesity ([place])    Pancreatitis due to biliary obstruction    Positive TB test    PTSD (post-traumatic stress disorder)    Sleep apnea    no CPAP machine    Ulcer, stomach peptic    Urticaria     Patient Active Problem List   Diagnosis Date Noted   History of Whipple procedure 02/04/2021   Bilateral leg weakness 01/15/2021   Dry mouth 01/15/2021   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   Fatigue 12/27/2020   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   Abdominal pain 09/13/2020   Biliary obstruction 09/12/2020   Obstructive jaundice 09/08/2020   Elevated LFTs 09/08/2020   Choledocholithiasis 09/07/2020   Urticaria    Ulcer, stomach peptic    Sleep apnea    Positive TB test    Migraines    Hypercholesterolemia     History of chicken pox    H/O hiatal hernia    Blood transfusion without reported diagnosis    Arthritis    Anxiety    Angio-edema    Allergy    Allergic contact dermatitis 07/09/2020   Chronic idiopathic urticaria 01/17/2019   Seasonal and perennial allergic rhinoconjunctivitis 11/08/2018   Allergic reaction 11/08/2018   Diarrhea 11/08/2018   Right thigh pain 05/19/2018   Melena    Acute gastrojejunal anastomotic ulcer    Macrocytic anemia    Acute GI bleeding 09/28/2017   Epigastric abdominal pain 11/19/2016   Incisional hernia with obstruction but no gangrene 07/21/2016   Menopause 04/22/2016   Routine general medical examination at a health care facility 03/18/2016   Obesity 03/18/2016   Severe episode of recurrent major depressive disorder, without psychotic features ([place])    Depression, major, severe recurrence ([place]) 09/11/2015   Major depressive disorder, recurrent, severe without psychotic features ([place])    MDD (major depressive disorder) 10/30/2014   MDD (major depressive disorder), recurrent severe, without psychosis ([place])    Suicidal ideation 10/05/2014   Depression 06/12/2014   Rash 01/04/2014   Normocytic anemia 01/04/2014   DJD (degenerative joint disease) 01/04/2014   Hypertension 01/04/2014   Dyslipidemia 01/04/2014   GERD (gastroesophageal  reflux disease) 01/04/2014   Obstructive sleep apnea 01/04/2014   Abdominal abscess 01/01/2014   Wound infection after surgery 01/01/2014   PTSD (post-traumatic stress disorder) 11/23/2013   Severe recurrent major depression without psychotic features ([place]) 11/22/2013   History of Roux-en-Y gastric bypass, 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 11/11/2012   Morbid obesity, Weight - 312, BMI - 45.2 04/21/2012    Past Surgical History:  Procedure Laterality Date   ABDOMINAL HYSTERECTOMY  1997 & 2006   BREATH TEK H PYLORI  05/03/2012   Procedure: BREATH TEK H PYLORI;  Surgeon: [name], MD;  Location: [name];  Service: General;   Laterality: N/A;   COLONOSCOPY     hx of benign polyps    ESOPHAGOGASTRODUODENOSCOPY N/A 09/29/2017   Procedure: ESOPHAGOGASTRODUODENOSCOPY (EGD);  Surgeon: Ladene Artist, MD;  Location: [name];  Service: Endoscopy;  Laterality: N/A;   ESOPHAGOGASTRODUODENOSCOPY N/A 09/14/2020   Procedure: ESOPHAGOGASTRODUODENOSCOPY (EGD);  Surgeon: [name], MD;  Location: [name];  Service: Endoscopy;  Laterality: N/A;   EUS N/A 09/14/2020   Procedure: UPPER ENDOSCOPIC ULTRASOUND (EUS) RADIAL;  Surgeon: [name], MD;  Location: [facility];  Service: Endoscopy;  Laterality: N/A;   EYE SURGERY     fixed a hole in the left eye   FOOT SURGERY Left    GASTRIC BYPASS     GASTRIC ROUX-EN-Y N/A 07/05/2012   Procedure: LAPAROSCOPIC ROUX-EN-Y GASTRIC;  Surgeon: [name], MD;  Location: [facility];  Service: General;  Laterality: N/A;   IR BILIARY DRAIN PLACEMENT WITH CHOLANGIOGRAM  09/08/2020   IR ENDOLUMINAL BX OF BILIARY TREE  09/20/2020   IR EXCHANGE BILIARY DRAIN  09/20/2020   IRRIGATION AND DEBRIDEMENT ABSCESS N/A 01/01/2014   Procedure: IRRIGATION AND DEBRIDEMENT ABSCESS;  Surgeon: [name], MD;  Location: [facility];  Service: General;  Laterality: N/A;   LAPAROSCOPIC LYSIS OF ADHESIONS N/A 07/05/2012   Procedure: LAPAROSCOPIC LYSIS OF ADHESIONS;  Surgeon: [name], MD;  Location: [facility];  Service: General;  Laterality: N/A;  repair of abdominal wall hernia   right tube and ovary removed      UPPER GI ENDOSCOPY N/A 07/05/2012   Procedure: UPPER GI ENDOSCOPY;  Surgeon: [name], MD;  Location: [facility];  Service: General;  Laterality: N/A;   WHIPPLE PROCEDURE N/A 11/05/2020   Procedure: WHIPPLE PROCEDURE;  Surgeon: Dwan Bolt, MD;  Location: [facility];  Service: General;  Laterality: N/A;     OB History     Gravida  0   Para  0   Term  0   Preterm  0   AB  0   Living  0      SAB  0   IAB  0   Ectopic  0   Multiple  0   Live Births  0            Family History  Problem Relation Age of Onset   Colon cancer Mother 33   Graves' disease Mother    Cancer Father    Depression Father    Diabetes Father    Heart disease Father    Bladder Cancer Father    Heart Problems Father    Depression Sister    Post-traumatic stress disorder Sister    Anxiety disorder Sister    Eczema Sister    Breast cancer Maternal Aunt    Alcohol abuse Paternal Uncle    Melanoma Maternal Grandmother    Colon cancer Maternal Grandmother  does not know age of onset   Healthy Maternal Grandfather    Healthy Paternal Grandmother    Heart disease Paternal Grandfather    Stroke Paternal Grandfather    Diabetes Paternal Grandfather    Bipolar disorder Cousin    Esophageal cancer Neg Hx    Stomach cancer Neg Hx    Rectal cancer Neg Hx     Social History   Tobacco Use   Smoking status: Former    Packs/day: 0.10    Years: 1.00    Pack years: 0.10    Types: Cigarettes    Quit date: 04/27/2004    Years since quitting: 16.8   Smokeless tobacco: Never  Vaping Use   Vaping Use: Never used  Substance Use Topics   Alcohol use: Not Currently   Drug use: No    Home Medications Prior to Admission medications   Medication Sig Start Date End Date Taking? Authorizing Provider  acetaminophen (TYLENOL) 500 MG tablet Take 500 mg by mouth 2 (two) times daily as needed for moderate pain or headache.    [provider]  calcium-vitamin D (OSCAL WITH D) 500-200 MG-UNIT tablet Take 1 tablet by mouth every evening. For bone health 09/10/20   Samuella Cota, MD  cetirizine (ZYRTEC) 10 MG tablet Take 10 mg by mouth 2 (two) times daily.    [provider]  cholecalciferol (VITAMIN D) 1000 units tablet Take 1 tablet (1,000 Units total) by mouth daily. For bone health 09/17/15   Lindell Spar I, NP  DULoxetine (CYMBALTA) 60 MG capsule Take 1 capsule (60 mg total) by mouth 2 (two) times daily. 03/04/21   Arfeen, Arlyce Harman, MD   EPINEPHrine (EPIPEN 2-PAK) 0.3 mg/0.3 mL IJ SOAJ injection Inject 0.3 mg into the muscle as needed for anaphylaxis. 07/09/20   Garnet Sierras, DO  esomeprazole (NEXIUM) 40 MG capsule Nexium 40 mg capsules PO QD, 1 year of refills, open capsule sprinkle granules in apple sauce 02/06/21   Ladene Artist, MD  LORazepam (ATIVAN) 0.5 MG tablet Take 1 tablet (0.5 mg total) by mouth daily as needed for anxiety. 03/04/21 03/04/22  Arfeen, Arlyce Harman, MD  montelukast (SINGULAIR) 10 MG tablet TAKE 1 TABLET(10 MG) BY MOUTH AT BEDTIME 02/08/21   Garnet Sierras, DO  Multiple Vitamin (MULTI-VITAMIN) tablet Take 1 tablet by mouth daily.    [provider]  pantoprazole (PROTONIX) 40 MG tablet Take 1 tablet (40 mg total) by mouth daily. 11/09/20 11/09/21  Dwan Bolt, MD  QUEtiapine (SEROQUEL) 50 MG tablet TAKE 3 TABLETS(150 MG) BY MOUTH AT BEDTIME 03/04/21   Arfeen, Arlyce Harman, MD  vitamin B-12 (CYANOCOBALAMIN) 500 MCG tablet Take 1 tablet (500 mcg total) by mouth daily. For Vitamin B-12 replacement 09/17/15   Lindell Spar I, NP  vitamin E 1000 UNIT capsule Take 1,000 Units by mouth daily.    [provider]  XOLAIR 150 MG injection INJECT 300 MG UNDER THE SKIN EVERY 14 DAYS 06/21/20   Garnet Sierras, DO    Allergies    Contrast media [iodinated diagnostic agents], Amoxicillin, Bactrim [sulfamethoxazole-trimethoprim], and Lamictal [lamotrigine]  Review of Systems   Review of Systems  All other systems reviewed and are negative.  Physical Exam Updated Vital Signs BP 122/82   Pulse 85   Temp (!) 97.5 F (36.4 C) (Oral)   Resp 16   Ht 1.753 m (5\' 9" )   Wt 100.7 kg   SpO2 98%   BMI 32.78 kg/m  Physical Exam Vitals and nursing note reviewed.  Constitutional:      General: She is not in acute distress.    Appearance: Normal appearance. She is well-developed. She is not toxic-appearing.  HENT:     Head: Normocephalic and atraumatic.  Eyes:     General: Lids are normal.     Conjunctiva/sclera:  Conjunctivae normal.     Pupils: Pupils are equal, round, and reactive to light.  Neck:     Thyroid: No thyroid mass.     Trachea: No tracheal deviation.  Cardiovascular:     Rate and Rhythm: Normal rate and regular rhythm.     Heart sounds: Normal heart sounds. No murmur heard.   No gallop.  Pulmonary:     Effort: Pulmonary effort is normal. No respiratory distress.     Breath sounds: Normal breath sounds. No stridor. No decreased breath sounds, wheezing, rhonchi or rales.  Abdominal:     General: There is no distension.     Palpations: Abdomen is soft.     Tenderness: There is no abdominal tenderness. There is no rebound.  Musculoskeletal:        General: No tenderness. Normal range of motion.     Cervical back: Normal range of motion and neck supple.  Skin:    General: Skin is warm and dry.     Findings: No abrasion or rash.  Neurological:     Mental Status: She is alert and oriented to person, place, and time. Mental status is at baseline.     GCS: GCS eye subscore is 4. GCS verbal subscore is 5. GCS motor subscore is 6.     Cranial Nerves: No cranial nerve deficit.     Sensory: No sensory deficit.     Motor: Weakness present.     Comments: 4 out of 5 strength bilateral lower extremities.  Strength normal in upper extremities.  Psychiatric:        Attention and Perception: Attention normal.        Speech: Speech normal.        Behavior: Behavior normal.    ED Results / Procedures / Treatments   Labs (all labs ordered are listed, but only abnormal results are displayed) Labs Reviewed  BASIC METABOLIC PANEL - Abnormal; Notable for the following components:      Result Value   Glucose, Bld 113 (*)    Calcium 7.8 (*)    All other components within normal limits  CBC - Abnormal; Notable for the following components:   RBC 3.07 (*)    Hemoglobin 11.0 (*)    HCT 33.0 (*)    MCV 107.5 (*)    MCH 35.8 (*)    RDW 15.7 (*)    All other components within normal limits   URINALYSIS, ROUTINE W REFLEX MICROSCOPIC  CBG MONITORING, ED    EKG EKG Interpretation  Date/Time:  Tuesday March 12 2021 11:10:27 EST Ventricular Rate:  82 PR Interval:  139 QRS Duration: 56 QT Interval:  394 QTC Calculation: 461 R Axis:   27 Text Interpretation: Sinus rhythm Low voltage, extremity and precordial leads Confirmed by Lacretia Leigh ([id number]) on 03/12/2021 7:39:31 PM  Radiology No results found.  Procedures Procedures   Medications Ordered in ED Medications - No data to display  ED Course  I have reviewed the triage vital signs and the nursing notes.  Pertinent labs & imaging results that were available during my care of the patient were reviewed by me and considered  in my medical decision making (see chart for details).    MDM Rules/Calculators/A&P                           Labs here are reassuring.  She has not noted any new weakness to her legs for several months.  Is scheduled for follow-up at [facility].  No obvious impending neurological process at this time Final Clinical Impression(s) / ED Diagnoses Final diagnoses:  None    Rx / DC Orders ED Discharge Orders     None        Lacretia Leigh, MD 03/12/21 1958

## 2021-03-12 NOTE — ED Triage Notes (Signed)
Patient states she had a Whipple Procedure in July 2022. Patient states she has not been unable to eat or drink adequately and states worsening. Patient reports severe weakness and feeling like she is going to pass out.

## 2021-03-12 NOTE — Telephone Encounter (Addendum)
Patient reports that she is having fainting spells when she is standing up and ambulating.  She is advised she needs to go to the ED for evaluation. She verbalized understanding and to not drive herself. She has her consult with [facility] on 03/29/21

## 2021-03-12 NOTE — Telephone Encounter (Signed)
Agree with ED evaluation for fainting spells. Keep appt with [facility] on 12/16 as scheduled for mgmt of autoimmune pancreatitis.

## 2021-03-13 NOTE — Telephone Encounter (Signed)
Patient called states she follow the recommendations and went to the ED dept yesterday, however when she got there she said they asked her " why are you here? There is nothing we can do for you " and sent her home. The patient expressed the need for help and requested a call back.

## 2021-03-13 NOTE — Telephone Encounter (Signed)
Called patient back and spoke to her and daughter. States she did go to the ED yesterday and requested IV fluids &/or tube feeding, since she has not been able to eat or drink much of anything and is very weak & dizzy. States that was not done. Was able to reschedule her office visit with Dr. Fuller Plan for tomorrow 03/14/21 and she promises her daughter will bring her and she will not cancel.

## 2021-03-14 ENCOUNTER — Encounter: Payer: Self-pay | Admitting: Gastroenterology

## 2021-03-14 ENCOUNTER — Ambulatory Visit (INDEPENDENT_AMBULATORY_CARE_PROVIDER_SITE_OTHER): Payer: 59 | Admitting: Gastroenterology

## 2021-03-14 VITALS — BP 110/78 | HR 85 | Wt 225.0 lb

## 2021-03-14 DIAGNOSIS — R531 Weakness: Secondary | ICD-10-CM

## 2021-03-14 DIAGNOSIS — R634 Abnormal weight loss: Secondary | ICD-10-CM

## 2021-03-14 DIAGNOSIS — R112 Nausea with vomiting, unspecified: Secondary | ICD-10-CM | POA: Diagnosis not present

## 2021-03-14 DIAGNOSIS — R630 Anorexia: Secondary | ICD-10-CM

## 2021-03-14 DIAGNOSIS — K861 Other chronic pancreatitis: Secondary | ICD-10-CM | POA: Diagnosis not present

## 2021-03-14 DIAGNOSIS — R6881 Early satiety: Secondary | ICD-10-CM

## 2021-03-14 MED ORDER — ESOMEPRAZOLE MAGNESIUM 40 MG PO CPDR: DELAYED_RELEASE_CAPSULE | ORAL | 11 refills | Status: AC

## 2021-03-14 MED ORDER — ONDANSETRON HCL 4 MG PO TABS: 4.0000 mg | ORAL_TABLET | Freq: Four times a day (QID) | ORAL | 5 refills | Status: AC

## 2021-03-14 NOTE — Patient Instructions (Signed)
MAKE AN APPOINTMENT WITH YOUR PRIMARY CARE PHYSICIAN.   We have sent the following medications to your pharmacy for you to pick up at your convenience: Nexium 40 mg twice daily and zofran four times a day before meals and at bedtime.   If your weakness continues or worsens then go back to the emergency department.   The [facility] providers would like to encourage you to use [facility] to communicate with providers for non-urgent requests or questions.  Due to long hold times on the telephone, sending your provider a message by [facility] may be a faster and more efficient way to get a response.  Please allow 48 business hours for a response.  Please remember that this is for non-urgent requests.   Thank you for choosing me and [facility].  Pricilla Riffle. Dagoberto Ligas., MD., Marval Regal

## 2021-03-14 NOTE — Progress Notes (Signed)
    History of Present Illness: This is a 55 year old female with nausea, vomiting, generalized weakness.  She was evaluated in the ED on November 29 however weakness was the primary problem addressed at her ED visit.  The patient relates it is difficult for her to stand or walk however evaluation in the ED revealed 4/5 strength bilaterally in both lower extremities and normal 5/5 strength in both upper extremities.  She states she is unable to eat or drink.  She states she has difficulty with dry mouth and this further exacerbates her ability to eat.  She states she has no appetite.  She denies abdominal pain, chest pain, dysphagia, odynophagia, change in bowel habits, melena, hematochezia, hematemesis.  EGD 01/2021 - Erythema in the mid esophagus. - Esophageal plaques were found, suspicious for candidiasis. Biopsied. - Gastric bypass with intact staple line. - Normal examined jejunum. Biopsies: Unremarkable  Current Medications, Allergies, Past Medical History, Past Surgical History, Family History and Social History were reviewed in [employer] record.   Physical Exam: General: Well developed, well nourished, no acute distress Head: Normocephalic and atraumatic Eyes: Sclerae anicteric, EOMI Ears: Normal auditory acuity Mouth: Not examined, mask on during Covid-19 pandemic Lungs: Clear throughout to auscultation Heart: Regular rate and rhythm; no murmurs, rubs or bruits Abdomen: Soft, non tender and non distended. No masses, hepatosplenomegaly or hernias noted. Normal Bowel sounds Rectal: Not done Musculoskeletal: Symmetrical with no gross deformities  Pulses:  Normal pulses noted Extremities: No clubbing, cyanosis, edema or deformities noted Neurological: Alert oriented x 4, grossly nonfocal Psychological:  Alert and cooperative.  Depressed affect   Assessment and Recommendations:  Nausea, vomiting, poor appetite.  Type I autoimmune sclerosing pancreatitis.   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  Recent EGD was unremarkable.  She has an appointment at [facility] on December 16 for further management of her autoimmune pancreatitis in the setting of prior Whipple and gastric bypass.  Begin Zofran 4 mg qid taken 30 to 60 minutes tid before meals and hs (not prn).  Increase Nexium to 40 mg p.o. twice daily, open capsule in applesauce. Generalized weakness, depressed affect.  Advised follow-up with her PCP ASAP.  If her ability to adequately maintain p.o. intake and her generalized weakness does not rapidly improve she may require hospitalization for further evaluation and management.Marland Kitchen

## 2021-03-21 ENCOUNTER — Ambulatory Visit: Payer: 59

## 2021-03-26 ENCOUNTER — Telehealth: Payer: Self-pay | Admitting: Gastroenterology

## 2021-03-26 NOTE — Telephone Encounter (Signed)
Hey Dr. Fuller Plan ,  Patient daughter called in wanted to inform you patient had complications and passed away 2021-04-10.   Thank you

## 2021-03-26 NOTE — Telephone Encounter (Signed)
Documents from Care everywhere forwarded to Dr. Fuller Plan.  Status changed to deceased

## 2021-03-26 NOTE — Telephone Encounter (Signed)
Please let her daughter know that I am very sorry for the loss of her mother. Although her health had declined her passing away is a shock. If she is comfortable providing more information about how she passed away we would like to know.

## 2021-04-09 ENCOUNTER — Ambulatory Visit: Payer: 59 | Admitting: Psychology

## 2021-04-14 DEATH — deceased

## 2021-06-04 ENCOUNTER — Telehealth (HOSPITAL_COMMUNITY): Payer: 59 | Admitting: Psychiatry
